# Patient Record
Sex: Male | Born: 1960 | Race: White | Hispanic: No | Marital: Single | Smoking: Former smoker
Health system: Southern US, Community
[De-identification: ages and names within clinical notes are randomized; demographics above are authoritative.]

## PROBLEM LIST (undated history)

## (undated) DIAGNOSIS — R519 Headache, unspecified: Secondary | ICD-10-CM

## (undated) DIAGNOSIS — N159 Renal tubulo-interstitial disease, unspecified: Secondary | ICD-10-CM

## (undated) DIAGNOSIS — F4310 Post-traumatic stress disorder, unspecified: Secondary | ICD-10-CM

## (undated) DIAGNOSIS — I10 Essential (primary) hypertension: Secondary | ICD-10-CM

## (undated) DIAGNOSIS — R0600 Dyspnea, unspecified: Secondary | ICD-10-CM

## (undated) DIAGNOSIS — F419 Anxiety disorder, unspecified: Secondary | ICD-10-CM

## (undated) DIAGNOSIS — Z87442 Personal history of urinary calculi: Secondary | ICD-10-CM

## (undated) DIAGNOSIS — C801 Malignant (primary) neoplasm, unspecified: Secondary | ICD-10-CM

## (undated) DIAGNOSIS — R7303 Prediabetes: Secondary | ICD-10-CM

## (undated) DIAGNOSIS — J189 Pneumonia, unspecified organism: Secondary | ICD-10-CM

## (undated) HISTORY — PX: HUMERUS FRACTURE SURGERY: SHX670

## (undated) HISTORY — PX: TONSILLECTOMY AND ADENOIDECTOMY: SUR1326

---

## 2003-08-17 ENCOUNTER — Emergency Department (HOSPITAL_COMMUNITY): Admission: EM | Admit: 2003-08-17 | Discharge: 2003-08-17 | Payer: Self-pay | Admitting: Family Medicine

## 2003-09-24 ENCOUNTER — Emergency Department (HOSPITAL_COMMUNITY): Admission: AD | Admit: 2003-09-24 | Discharge: 2003-09-24 | Payer: Self-pay | Admitting: Family Medicine

## 2003-12-31 ENCOUNTER — Emergency Department (HOSPITAL_COMMUNITY): Admission: EM | Admit: 2003-12-31 | Discharge: 2003-12-31 | Payer: Self-pay | Admitting: Family Medicine

## 2005-12-11 ENCOUNTER — Emergency Department (HOSPITAL_COMMUNITY): Admission: EM | Admit: 2005-12-11 | Discharge: 2005-12-11 | Payer: Self-pay | Admitting: Family Medicine

## 2013-07-10 HISTORY — PX: MULTIPLE TOOTH EXTRACTIONS: SHX2053

## 2015-01-08 ENCOUNTER — Encounter (HOSPITAL_COMMUNITY): Payer: Self-pay | Admitting: Emergency Medicine

## 2015-01-08 ENCOUNTER — Emergency Department (HOSPITAL_COMMUNITY): Payer: Self-pay

## 2015-01-08 ENCOUNTER — Emergency Department (HOSPITAL_COMMUNITY)
Admission: EM | Admit: 2015-01-08 | Discharge: 2015-01-08 | Disposition: A | Discharge status: Home / Self Care | Payer: Self-pay | Attending: Emergency Medicine | Admitting: Emergency Medicine

## 2015-01-08 DIAGNOSIS — K5900 Constipation, unspecified: Secondary | ICD-10-CM | POA: Insufficient documentation

## 2015-01-08 DIAGNOSIS — I10 Essential (primary) hypertension: Secondary | ICD-10-CM | POA: Insufficient documentation

## 2015-01-08 DIAGNOSIS — R1032 Left lower quadrant pain: Secondary | ICD-10-CM

## 2015-01-08 DIAGNOSIS — N508 Other specified disorders of male genital organs: Secondary | ICD-10-CM | POA: Insufficient documentation

## 2015-01-08 DIAGNOSIS — N200 Calculus of kidney: Secondary | ICD-10-CM | POA: Insufficient documentation

## 2015-01-08 DIAGNOSIS — Z79899 Other long term (current) drug therapy: Secondary | ICD-10-CM | POA: Insufficient documentation

## 2015-01-08 HISTORY — DX: Essential (primary) hypertension: I10

## 2015-01-08 LAB — COMPREHENSIVE METABOLIC PANEL
ALT: 56 U/L (ref 17–63)
ANION GAP: 10 (ref 5–15)
AST: 28 U/L (ref 15–41)
Albumin: 4.3 g/dL (ref 3.5–5.0)
Alkaline Phosphatase: 86 U/L (ref 38–126)
BILIRUBIN TOTAL: 1 mg/dL (ref 0.3–1.2)
BUN: 16 mg/dL (ref 6–20)
CO2: 26 mmol/L (ref 22–32)
CREATININE: 1.11 mg/dL (ref 0.61–1.24)
Calcium: 9.1 mg/dL (ref 8.9–10.3)
Chloride: 100 mmol/L — ABNORMAL LOW (ref 101–111)
GLUCOSE: 106 mg/dL — AB (ref 65–99)
POTASSIUM: 4.4 mmol/L (ref 3.5–5.1)
Sodium: 136 mmol/L (ref 135–145)
Total Protein: 7.7 g/dL (ref 6.5–8.1)

## 2015-01-08 LAB — URINALYSIS, ROUTINE W REFLEX MICROSCOPIC
BILIRUBIN URINE: NEGATIVE
GLUCOSE, UA: NEGATIVE mg/dL
LEUKOCYTES UA: NEGATIVE
NITRITE: NEGATIVE
Protein, ur: NEGATIVE mg/dL
Specific Gravity, Urine: 1.02 (ref 1.005–1.030)
UROBILINOGEN UA: 0.2 mg/dL (ref 0.0–1.0)
pH: 6 (ref 5.0–8.0)

## 2015-01-08 LAB — CBC WITH DIFFERENTIAL/PLATELET
BASOS PCT: 0 % (ref 0–1)
Basophils Absolute: 0 10*3/uL (ref 0.0–0.1)
Eosinophils Absolute: 0.1 10*3/uL (ref 0.0–0.7)
Eosinophils Relative: 1 % (ref 0–5)
HCT: 45.5 % (ref 39.0–52.0)
HEMOGLOBIN: 14.8 g/dL (ref 13.0–17.0)
LYMPHS ABS: 0.5 10*3/uL — AB (ref 0.7–4.0)
Lymphocytes Relative: 5 % — ABNORMAL LOW (ref 12–46)
MCH: 27.9 pg (ref 26.0–34.0)
MCHC: 32.5 g/dL (ref 30.0–36.0)
MCV: 85.8 fL (ref 78.0–100.0)
MONO ABS: 0.7 10*3/uL (ref 0.1–1.0)
MONOS PCT: 8 % (ref 3–12)
Neutro Abs: 7.3 10*3/uL (ref 1.7–7.7)
Neutrophils Relative %: 86 % — ABNORMAL HIGH (ref 43–77)
Platelets: 227 10*3/uL (ref 150–400)
RBC: 5.3 MIL/uL (ref 4.22–5.81)
RDW: 14.5 % (ref 11.5–15.5)
WBC: 8.5 10*3/uL (ref 4.0–10.5)

## 2015-01-08 LAB — URINE MICROSCOPIC-ADD ON

## 2015-01-08 MED ORDER — KETOROLAC TROMETHAMINE 30 MG/ML IJ SOLN
30.0000 mg | Freq: Once | INTRAMUSCULAR | Status: AC
  Administered 2015-01-08: 30 mg via INTRAVENOUS
  Filled 2015-01-08: qty 1

## 2015-01-08 MED ORDER — SODIUM CHLORIDE 0.9 % IV SOLN
Freq: Once | INTRAVENOUS | Status: AC
  Administered 2015-01-08: 100 mL/h via INTRAVENOUS

## 2015-01-08 MED ORDER — IOHEXOL 300 MG/ML  SOLN
25.0000 mL | Freq: Once | INTRAMUSCULAR | Status: AC | PRN
  Administered 2015-01-08: 25 mL via ORAL

## 2015-01-08 MED ORDER — IOHEXOL 300 MG/ML  SOLN
100.0000 mL | Freq: Once | INTRAMUSCULAR | Status: AC | PRN
  Administered 2015-01-08: 100 mL via INTRAVENOUS

## 2015-01-08 MED ORDER — ONDANSETRON HCL 4 MG/2ML IJ SOLN
4.0000 mg | Freq: Once | INTRAMUSCULAR | Status: AC
  Administered 2015-01-08: 4 mg via INTRAVENOUS
  Filled 2015-01-08: qty 2

## 2015-01-08 MED ORDER — DOCUSATE SODIUM 100 MG PO CAPS: 100.0000 mg | ORAL_CAPSULE | Freq: Two times a day (BID) | ORAL | Status: DC

## 2015-01-08 MED ORDER — TAMSULOSIN HCL 0.4 MG PO CAPS: 0.4000 mg | ORAL_CAPSULE | Freq: Every day | ORAL | Status: DC

## 2015-01-08 MED ORDER — ONDANSETRON 4 MG PO TBDP: 4.0000 mg | ORAL_TABLET | Freq: Three times a day (TID) | ORAL | Status: DC | PRN

## 2015-01-08 MED ORDER — MORPHINE SULFATE 4 MG/ML IJ SOLN
4.0000 mg | INTRAMUSCULAR | Status: DC | PRN
  Administered 2015-01-08: 4 mg via INTRAVENOUS
  Filled 2015-01-08 (×2): qty 1

## 2015-01-08 MED ORDER — MAGNESIUM CITRATE PO SOLN
0.5000 | Freq: Once | ORAL | Status: AC
  Administered 2015-01-08: 0.5 via ORAL
  Filled 2015-01-08: qty 296

## 2015-01-08 MED ORDER — TAMSULOSIN HCL 0.4 MG PO CAPS
0.4000 mg | ORAL_CAPSULE | Freq: Once | ORAL | Status: AC
  Administered 2015-01-08: 0.4 mg via ORAL
  Filled 2015-01-08: qty 1

## 2015-01-08 MED ORDER — HYDROMORPHONE HCL 1 MG/ML IJ SOLN
1.0000 mg | Freq: Once | INTRAMUSCULAR | Status: AC
  Administered 2015-01-08: 1 mg via INTRAVENOUS
  Filled 2015-01-08: qty 1

## 2015-01-08 MED ORDER — SODIUM CHLORIDE 0.9 % IV BOLUS (SEPSIS)
500.0000 mL | Freq: Once | INTRAVENOUS | Status: AC
  Administered 2015-01-08: 500 mL via INTRAVENOUS

## 2015-01-08 MED ORDER — OXYCODONE-ACETAMINOPHEN 5-325 MG PO TABS: 2.0000 | ORAL_TABLET | ORAL | Status: DC | PRN

## 2015-01-08 NOTE — ED Provider Notes (Signed)
CSN: [id number]     Arrival date & time 01/08/15  1502 History   First MD Initiated Contact with Patient 01/08/15 1545     Chief Complaint  Patient presents with  . Abdominal Pain      HPI  She presents for evaluation of abdominal pain he is concerned that he is constipated because he has not had a bowel movement for last 4 days. He took some magnesium citrate last night and took an enema this morning. He continues to have pain in his left testicle left abdomen and left flank. States he's had a kidney stone he states it does not feel like a kidney stone.  He denies fever shakes chills. Mild nausea no vomiting.  Past Medical History  Diagnosis Date  . Hypertension    History reviewed. No pertinent past surgical history. History reviewed. No pertinent family history. History  Substance Use Topics  . Smoking status: Never Smoker   . Smokeless tobacco: Not on file  . Alcohol Use: No    Review of Systems  Constitutional: Negative for fever, chills, diaphoresis, appetite change and fatigue.  HENT: Negative for mouth sores, sore throat and trouble swallowing.   Eyes: Negative for visual disturbance.  Respiratory: Negative for cough, chest tightness, shortness of breath and wheezing.   Cardiovascular: Negative for chest pain.  Gastrointestinal: Positive for nausea, abdominal pain and constipation. Negative for vomiting, diarrhea and abdominal distention.  Endocrine: Negative for polydipsia, polyphagia and polyuria.  Genitourinary: Positive for testicular pain. Negative for dysuria, frequency and hematuria.  Musculoskeletal: Negative for gait problem.  Skin: Negative for color change, pallor and rash.  Neurological: Negative for dizziness, syncope, light-headedness and headaches.  Hematological: Does not bruise/bleed easily.  Psychiatric/Behavioral: Negative for behavioral problems and confusion.      Allergies  Chocolate and Other  Home Medications   Prior to Admission  medications   Medication Sig Start Date End Date Taking? Authorizing Provider  amLODipine (NORVASC) 10 MG tablet Take 10 mg by mouth daily.   Yes Historical Provider, MD  Glycerin, Laxative, (FLEET LIQUID GLYCERIN SUPP RE) Place 1 suppository rectally once.   Yes Historical Provider, MD  ibuprofen (ADVIL,MOTRIN) 200 MG tablet Take 400 mg by mouth every 6 (six) hours as needed for headache, mild pain or moderate pain.   Yes Historical Provider, MD  lisinopril (PRINIVIL,ZESTRIL) 40 MG tablet Take 40 mg by mouth daily.   Yes Historical Provider, MD  magnesium citrate SOLN Take 1 Bottle by mouth once.   Yes Historical Provider, MD  docusate sodium (COLACE) 100 MG capsule Take 1 capsule (100 mg total) by mouth every 12 (twelve) hours. 01/08/15   Tanna Furry, MD  ondansetron (ZOFRAN ODT) 4 MG disintegrating tablet Take 1 tablet (4 mg total) by mouth every 8 (eight) hours as needed for nausea. 01/08/15   Tanna Furry, MD  oxyCODONE-acetaminophen (PERCOCET/ROXICET) 5-325 MG per tablet Take 2 tablets by mouth every 4 (four) hours as needed. 01/08/15   Tanna Furry, MD  tamsulosin (FLOMAX) 0.4 MG CAPS capsule Take 1 capsule (0.4 mg total) by mouth daily. 01/08/15   Tanna Furry, MD   BP 159/80 mmHg  Pulse 84  Temp(Src) 98.4 F (36.9 C) (Oral)  Resp 20  SpO2 99% Physical Exam  Constitutional: He is oriented to person, place, and time. He appears well-developed and well-nourished. No distress.  HENT:  Head: Normocephalic.  Eyes: Conjunctivae are normal. Pupils are equal, round, and reactive to light. No scleral icterus.  Neck: Normal range  of motion. Neck supple. No thyromegaly present.  Cardiovascular: Normal rate and regular rhythm.  Exam reveals no gallop and no friction rub.   No murmur heard. Pulmonary/Chest: Effort normal and breath sounds normal. No respiratory distress. He has no wheezes. He has no rales.  Abdominal: Soft. Bowel sounds are normal. He exhibits no distension. There is no tenderness. There is  no rebound.    Musculoskeletal: Normal range of motion.  Neurological: He is alert and oriented to person, place, and time.  Skin: Skin is warm and dry. No rash noted.  Psychiatric: He has a normal mood and affect. His behavior is normal.    ED Course  Procedures (including critical care time) Labs Review Labs Reviewed  CBC WITH DIFFERENTIAL/PLATELET - Abnormal; Notable for the following:    Neutrophils Relative % 86 (*)    Lymphocytes Relative 5 (*)    Lymphs Abs 0.5 (*)    All other components within normal limits  COMPREHENSIVE METABOLIC PANEL - Abnormal; Notable for the following:    Chloride 100 (*)    Glucose, Bld 106 (*)    All other components within normal limits  URINALYSIS, ROUTINE W REFLEX MICROSCOPIC (NOT AT [facility]) - Abnormal; Notable for the following:    Hgb urine dipstick LARGE (*)    Ketones, ur >80 (*)    All other components within normal limits  URINE MICROSCOPIC-ADD ON - Abnormal; Notable for the following:    Squamous Epithelial / LPF FEW (*)    All other components within normal limits    Imaging Review Ct Abdomen Pelvis W Contrast  01/08/2015   CLINICAL DATA:  Diffuse abdominal pain. Constipation. LEFT-greater-than- RIGHT sided abdominal pain. Initial encounter. LEFT lower quadrant pain. Abnormal urinalysis with hematuria. No elevated white blood cell count.  EXAM: CT ABDOMEN AND PELVIS WITH CONTRAST  TECHNIQUE: Multidetector CT imaging of the abdomen and pelvis was performed using the standard protocol following bolus administration of intravenous contrast.  CONTRAST:  92mL OMNIPAQUE IOHEXOL 300 MG/ML SOLN, 153mL OMNIPAQUE IOHEXOL 300 MG/ML SOLN  COMPARISON:  None.  FINDINGS: Musculoskeletal: Mild thoracolumbar degenerative disease. No aggressive osseous lesions.  Lung Bases: Dependent atelectasis.  Portions of the it mediastinum are visible on the most inferior slices. There is lymphadenopathy, with some calcified lymph nodes. These are incompletely visible  however the lymph nodes are enlarged.  Liver:  Normal.  Spleen:  Normal.  Gallbladder:  Normal.  Common bile duct:  Normal.  Pancreas:  Normal.  Adrenal glands:  Normal.  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 Delayed nephrogram in the LEFT inferior renal pole. The LEFT inferior pole moiety demonstrates mild hydroureteronephrosis extending to the iliac crossover where a 5 mm calculus is lodged. The upper pole moiety ureter and collecting system are normal. No LEFT upper pole collecting system calculi are present.  Nonobstructing LEFT inferior pole renal collecting system calculi are present with the largest measuring 6 mm.  Nonobstructing RIGHT renal collecting system calculi are present. The RIGHT ureter is normal. Too small to characterize low-density LEFT upper pole renal lesions, probably representing cysts. Similar lesions are present on the RIGHT.  Stomach:  Small hiatal hernia.  Small bowel:  Normal.  No obstruction or inflammatory change.  Colon:   Colonic diverticulosis.  Pelvic Genitourinary: Urinary bladder and prostate gland appear within normal limits.  Peritoneum: No free fluid.  No free air.  Vascular/lymphatic: No abdominal adenopathy, calcified or otherwise. No acute vascular abnormality.  Body  Wall: Fat containing RIGHT inguinal hernia.  IMPRESSION: 1. Duplicated LEFT renal collecting system. 5 mm obstructing calculus in the LEFT lower pole moiety ureter. 2. Partially visualized lymphadenopathy in the chest, with some areas of calcification. Noncontrast chest CT recommended in follow-up (nonemergent) for further evaluation. This finding is most commonly associated with sarcoidosis, old granulomatous disease and treated lymphoma. 3. Residual nonobstructing bilateral renal collecting system calculi. Small low-density bilateral renal lesions, most compatible with cysts. 4. Small hiatal hernia.   Electronically Signed   By: Dereck Ligas M.D.    On: 01/08/2015 18:14     EKG Interpretation None      MDM   Final diagnoses:  LLQ pain  Kidney stone    I discussed the results of CT scan with the patient at length. He does not show significant stool or stool burden on CT. He is a duplication of his left collecting system. He has hydronephrosis of the inferior collecting system. His kidney function is intact. He has a hematuria as expected. No leukocytosis. No signs of diverticulitis. I discussed this with him at length. He continues to insist that "I'm constipated". Offered him mag citrate tonight and Colace at home. Discussed with him several times that he will require pain control and hydration. Of offer him urological follow-up first of the week if he has not passed a stone with conservative measures.    Tanna Furry, MD 01/08/15 [phone]

## 2015-01-08 NOTE — ED Notes (Addendum)
Pt reports he has not had a bowel movement for past 4 days. Tried fleet enema and mag citrate last night, and mag citrate again this am with no relief. Went to PCP, who did not see any sign of impaction. Sent here for further eval. Having L back and groin pain. No emesis

## 2015-01-08 NOTE — Discharge Instructions (Signed)
You have a 59mm kidney stone. It has traveled more than 2/3 the distance to the bladder. Your CT scan does NOT show constipation or large amount of stool. You'll require pain medicine for the next several days until your kidney stone passes. We are giving you a laxative today, and stool softener to take it home so that you will continue to have bowel movements while taking the pain medication. Call the urologist at [facility] urology Tuesday if you continue to have pain.  Kidney Stones Kidney stones (urolithiasis) are solid masses that form inside your kidneys. The intense pain is caused by the stone moving through the kidney, ureter, bladder, and urethra (urinary tract). When the stone moves, the ureter starts to spasm around the stone. The stone is usually passed in your pee (urine).  HOME CARE  Drink enough fluids to keep your pee clear or pale yellow. This helps to get the stone out.  Strain all pee through the provided strainer. Do not pee without peeing through the strainer, not even once. If you pee the stone out, catch it in the strainer. The stone may be as small as a grain of salt. Take this to your doctor. This will help your doctor figure out what you can do to try to prevent more kidney stones.  Only take medicine as told by your doctor.  Follow up with your doctor as told.  Get follow-up X-rays as told by your doctor. GET HELP IF: You have pain that gets worse even if you have been taking pain medicine. GET HELP RIGHT AWAY IF:   Your pain does not get better with medicine.  You have a fever or shaking chills.  Your pain increases and gets worse over 18 hours.  You have new belly (abdominal) pain.  You feel faint or pass out.  You are unable to pee. MAKE SURE YOU:   Understand these instructions.  Will watch your condition.  Will get help right away if you are not doing well or get worse. Document Released: 12/13/2007 Document Revised: 02/26/2013 Document Reviewed:  11/27/2012 [facility] Patient Information 2015 [place], [place]. This information is not intended to replace advice given to you by your health care provider. Make sure you discuss any questions you have with your health care provider.

## 2016-07-10 HISTORY — PX: COLONOSCOPY: SHX174

## 2016-12-27 ENCOUNTER — Other Ambulatory Visit: Payer: Self-pay | Admitting: Orthopedic Surgery

## 2016-12-27 DIAGNOSIS — M5442 Lumbago with sciatica, left side: Secondary | ICD-10-CM

## 2016-12-30 ENCOUNTER — Ambulatory Visit
Admission: RE | Admit: 2016-12-30 | Discharge: 2016-12-30 | Disposition: A | Discharge status: Home / Self Care | Payer: Worker's Compensation | Source: Ambulatory Visit | Attending: Orthopedic Surgery | Admitting: Orthopedic Surgery

## 2016-12-30 DIAGNOSIS — M5442 Lumbago with sciatica, left side: Secondary | ICD-10-CM | POA: Diagnosis present

## 2016-12-30 DIAGNOSIS — M5136 Other intervertebral disc degeneration, lumbar region: Secondary | ICD-10-CM | POA: Diagnosis not present

## 2017-01-08 ENCOUNTER — Other Ambulatory Visit: Payer: Self-pay | Admitting: Orthopedic Surgery

## 2017-01-08 DIAGNOSIS — M5442 Lumbago with sciatica, left side: Secondary | ICD-10-CM

## 2017-01-18 ENCOUNTER — Other Ambulatory Visit: Payer: Self-pay | Admitting: Orthopedic Surgery

## 2017-01-18 DIAGNOSIS — M5442 Lumbago with sciatica, left side: Secondary | ICD-10-CM

## 2017-01-18 DIAGNOSIS — M5136 Other intervertebral disc degeneration, lumbar region: Secondary | ICD-10-CM

## 2017-01-25 ENCOUNTER — Other Ambulatory Visit: Payer: Self-pay | Admitting: Orthopedic Surgery

## 2017-01-25 DIAGNOSIS — S42321D Displaced transverse fracture of shaft of humerus, right arm, subsequent encounter for fracture with routine healing: Secondary | ICD-10-CM

## 2017-01-25 DIAGNOSIS — M7521 Bicipital tendinitis, right shoulder: Secondary | ICD-10-CM

## 2017-01-26 ENCOUNTER — Ambulatory Visit
Admission: RE | Admit: 2017-01-26 | Discharge: 2017-01-26 | Disposition: A | Discharge status: Home / Self Care | Payer: Worker's Compensation | Source: Ambulatory Visit | Attending: Orthopedic Surgery | Admitting: Orthopedic Surgery

## 2017-01-26 DIAGNOSIS — M5136 Other intervertebral disc degeneration, lumbar region: Secondary | ICD-10-CM

## 2017-01-26 DIAGNOSIS — M5442 Lumbago with sciatica, left side: Secondary | ICD-10-CM

## 2017-01-26 MED ORDER — METHYLPREDNISOLONE ACETATE 40 MG/ML INJ SUSP (RADIOLOG
120.0000 mg | Freq: Once | INTRAMUSCULAR | Status: AC
  Administered 2017-01-26: 120 mg via EPIDURAL

## 2017-01-26 MED ORDER — IOPAMIDOL (ISOVUE-M 200) INJECTION 41%
1.0000 mL | Freq: Once | INTRAMUSCULAR | Status: AC
  Administered 2017-01-26: 1 mL via EPIDURAL

## 2017-01-26 NOTE — Discharge Instructions (Signed)

## 2017-02-07 ENCOUNTER — Ambulatory Visit
Admission: RE | Admit: 2017-02-07 | Discharge: 2017-02-07 | Disposition: A | Discharge status: Home / Self Care | Payer: Worker's Compensation | Source: Ambulatory Visit | Attending: Orthopedic Surgery | Admitting: Orthopedic Surgery

## 2017-02-07 DIAGNOSIS — S42321D Displaced transverse fracture of shaft of humerus, right arm, subsequent encounter for fracture with routine healing: Secondary | ICD-10-CM

## 2017-02-07 DIAGNOSIS — M7521 Bicipital tendinitis, right shoulder: Secondary | ICD-10-CM

## 2019-07-26 ENCOUNTER — Ambulatory Visit (HOSPITAL_COMMUNITY)
Admission: EM | Admit: 2019-07-26 | Discharge: 2019-07-26 | Disposition: A | Discharge status: Home / Self Care | Payer: Self-pay | Attending: Urgent Care | Admitting: Urgent Care

## 2019-07-26 ENCOUNTER — Encounter (HOSPITAL_COMMUNITY): Payer: Self-pay

## 2019-07-26 ENCOUNTER — Other Ambulatory Visit: Payer: Self-pay

## 2019-07-26 DIAGNOSIS — R030 Elevated blood-pressure reading, without diagnosis of hypertension: Secondary | ICD-10-CM

## 2019-07-26 DIAGNOSIS — E669 Obesity, unspecified: Secondary | ICD-10-CM

## 2019-07-26 DIAGNOSIS — I10 Essential (primary) hypertension: Secondary | ICD-10-CM

## 2019-07-26 DIAGNOSIS — H5712 Ocular pain, left eye: Secondary | ICD-10-CM

## 2019-07-26 DIAGNOSIS — H00014 Hordeolum externum left upper eyelid: Secondary | ICD-10-CM

## 2019-07-26 MED ORDER — CEFDINIR 300 MG PO CAPS: 300.0000 mg | ORAL_CAPSULE | Freq: Two times a day (BID) | ORAL | 0 refills | Status: DC

## 2019-07-26 MED ORDER — ERYTHROMYCIN 5 MG/GM OP OINT: TOPICAL_OINTMENT | OPHTHALMIC | 0 refills | Status: DC

## 2019-07-26 NOTE — ED Provider Notes (Signed)
[facility]   MRN: [id number] DOB: Jul 14, 1960  Subjective:   Lorenza Rusek is a 59 y.o. male presenting for 2-day history of acute onset persistent and worsening left upper eyelid swelling with redness and pustule.  Patient states he has been using hot compresses without any relief.  Regarding his hypertension, states that he is normally right at [id number] systolic.  He states that usually his blood pressure becomes elevated when he has stress as he does now due to his eye.  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  No current facility-administered medications for this encounter.  Current Outpatient Medications:  .  amLODipine (NORVASC) 10 MG tablet, Take 10 mg by mouth daily., Disp: , Rfl:  .  lisinopril (PRINIVIL,ZESTRIL) 40 MG tablet, Take 40 mg by mouth daily., Disp: , Rfl:  .  docusate sodium (COLACE) 100 MG capsule, Take 1 capsule (100 mg total) by mouth every 12 (twelve) hours., Disp: 60 capsule, Rfl: 0 .  Glycerin, Laxative, (FLEET LIQUID GLYCERIN SUPP RE), Place 1 suppository rectally once., Disp: , Rfl:  .  ibuprofen (ADVIL,MOTRIN) 200 MG tablet, Take 400 mg by mouth every 6 (six) hours as needed for headache, mild pain or moderate pain., Disp: , Rfl:  .  magnesium citrate SOLN, Take 1 Bottle by mouth once., Disp: , Rfl:  .  ondansetron (ZOFRAN ODT) 4 MG disintegrating tablet, Take 1 tablet (4 mg total) by mouth every 8 (eight) hours as needed for nausea., Disp: 20 tablet, Rfl: 0 .  oxyCODONE-acetaminophen (PERCOCET/ROXICET) 5-325 MG per tablet, Take 2 tablets by mouth every 4 (four) hours as needed., Disp: 6 tablet, Rfl: 0 .  tamsulosin (FLOMAX) 0.4 MG CAPS capsule, Take 1 capsule (0.4 mg total) by mouth daily., Disp: 7 capsule, Rfl: 0   Allergies  Allergen Reactions  . Chocolate     Hyperactivity, respiratory distress  . Other     Novocaine- respiratory distress    Past Medical History:  Diagnosis Date  . Hypertension      History reviewed. No pertinent surgical history.  Family History  Problem Relation Age of Onset  . Hypertension Mother   . Diabetes Mother   . Bipolar disorder Mother   . Hypertension Father     Social History   Tobacco Use  . Smoking status: Former Smoker    Types: Cigarettes    Quit date: 1980    Years since quitting: 41.0  . Smokeless tobacco: Never Used  Substance Use Topics  . Alcohol use: No  . Drug use: No    ROS   Objective:   Vitals: BP (!) 173/103 (BP Location: Left Arm)   Pulse 78   Temp 98.7 F (37.1 C) (Oral)   Resp 18   SpO2 98%   Physical Exam Constitutional:      General: He is not in acute distress.    Appearance: Normal appearance. He is well-developed. He is obese. He is not ill-appearing, toxic-appearing or diaphoretic.  HENT:     Head: Normocephalic and atraumatic.     Right Ear: External ear normal.     Left Ear: External ear normal.     Nose: Nose normal.     Mouth/Throat:     Mouth: Mucous membranes are moist.     Pharynx: Oropharynx is clear.  Eyes:     General: No scleral icterus.       Right eye: No foreign body, discharge or hordeolum.  Left eye: Discharge and hordeolum present.No foreign body.     Extraocular Movements: Extraocular movements intact.     Conjunctiva/sclera:     Right eye: Right conjunctiva is not injected. No chemosis, exudate or hemorrhage.    Left eye: Left conjunctiva is injected (mildly). No chemosis, exudate or hemorrhage.    Pupils: Pupils are equal, round, and reactive to light.   Cardiovascular:     Rate and Rhythm: Normal rate and regular rhythm.     Heart sounds: Normal heart sounds. No murmur. No friction rub. No gallop.   Pulmonary:     Effort: Pulmonary effort is normal. No respiratory distress.     Breath sounds: Normal breath sounds. No stridor. No wheezing, rhonchi or rales.  Neurological:     Mental Status: He is alert and oriented to person, place, and time.     Cranial Nerves:  No cranial nerve deficit.     Motor: No weakness.     Coordination: Coordination normal.     Gait: Gait normal.     Deep Tendon Reflexes: Reflexes normal.  Psychiatric:        Mood and Affect: Mood normal.        Behavior: Behavior normal.        Thought Content: Thought content normal.        Judgment: Judgment normal.      Assessment and Plan :   1. Hordeolum externum of left upper eyelid   2. Left eye pain   3. Essential hypertension   4. Elevated blood pressure reading   5. Obesity without serious comorbidity, unspecified classification, unspecified obesity type     Start patient on cefdinir, erythromycin ointment for management of significantly infected left upper eyelid stye.  Counseled patient on his blood pressure and he insists that it is elevated only due to his stress.  Reports that he will monitor it at home, discussed warning signs and symptoms of ACS and stroke. Counseled patient on potential for adverse effects with medications prescribed/recommended today, ER and return-to-clinic precautions discussed, patient verbalized understanding.    Jaynee Eagles, PA-C 07/26/19 1038

## 2019-07-26 NOTE — ED Triage Notes (Signed)
Patient presents to Urgent Care with complaints of left eye swelling and irritaiton since 2-3 days ago. Patient reports he has been trying warm and cold compresses, thinks there is a pustule under the area of swelling, pt denies vision changes.

## 2020-07-10 DIAGNOSIS — U071 COVID-19: Secondary | ICD-10-CM

## 2020-07-10 HISTORY — PX: CYST EXCISION: SHX5701

## 2020-07-10 HISTORY — DX: COVID-19: U07.1

## 2021-10-11 ENCOUNTER — Ambulatory Visit (INDEPENDENT_AMBULATORY_CARE_PROVIDER_SITE_OTHER): Payer: Worker's Compensation | Admitting: Orthopaedic Surgery

## 2021-10-11 ENCOUNTER — Encounter: Payer: Self-pay | Admitting: Orthopaedic Surgery

## 2021-10-11 ENCOUNTER — Ambulatory Visit: Payer: Self-pay

## 2021-10-11 VITALS — BP 141/86 | HR 62 | Ht 72.0 in | Wt 365.0 lb

## 2021-10-11 DIAGNOSIS — M5459 Other low back pain: Secondary | ICD-10-CM

## 2021-10-11 DIAGNOSIS — M545 Low back pain, unspecified: Secondary | ICD-10-CM

## 2021-10-11 NOTE — Progress Notes (Signed)
? ?Office Visit Note ?  ?Patient: Keith Moreno           ?Date of Birth: 1961/02/27           ?MRN: [id number] ?Visit Date: 10/11/2021 ?             ?Requested by: No referring provider defined for this encounter. ?PCP: Pcp, No ? ? ?Assessment & Plan: ?Visit Diagnoses:  ?1. Acute bilateral low back pain, unspecified whether sciatica present   ? ? 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  He still in physical therapy plan to recheck him in 3 weeks if he has not made improvement we will consider FCE and then activities based on FCE findings.  I reviewed with him CT scan which she brought a disc so we could review this this is negative for acute changes.  He does have some minimal disc protrusions similar to the MRI scan that he had in 2018 with his other injury which is listed below.  That showed some mild degenerative changes.  Patient is to continue therapy until I see him back in 3 weeks. ? ?Follow-Up Instructions: No follow-ups on file.  ? ?Orders:  ?Orders Placed This Encounter  ?Procedures  ? XR Lumbar Spine 2-3 Views  ? ?No orders of the defined types were placed in this encounter. ? ? ? ? Procedures: ?No procedures performed ? ? ?Clinical Data: ?No additional findings. ? ? ?Subjective: ?Chief Complaint  ?Patient presents with  ? Lower Back - Pain  ?  OTJI 07/22/2021  ? ? ?HPI 61 year old male referred by [employer] 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  Pain radiates at the lumbosacral junction sometimes up to the mid lumbar region primarily on the left side and some into the left thigh most times stopping at the knee occasionally lost a little sensation in his ankle he states he has had 2 episodes when he fell since his injury when he felt like left leg was not holding him well.  He has used some ibuprofen and Tylenol.   Chart review showed that back in 2018 he had on-the-job injury with a fall and some back and left leg pain had an MRI scan showed some mild protrusion and he had a single epidural injection and this was at the time when he had a humerus fracture that eventually had surgery at [facility] was plated and is healed.  Recently had a CT scan available and reviewed on disc.  This is negative for acute bony injury.  Patient's been out of work and has been followed at [facility] they recently gave him a work note that stated no driving no lifting pushing or pulling more than 5 pounds no climbing in and out of a truck and this was dated 09/07/2021.  Patient's been going to therapy clinic here in [place] [address]  He states he has had about 4 visits so far with therapy.  Patient states he has been going to physical therapy at [place]. ? ?Review of Systems type 2 diabetes on oral medication not on insulin. ? ? ? ?Objective: ?Vital Signs: BP (!) 141/86   Pulse 62   Ht 6' (1.829 m)   Wt (!) 365 lb (165.6 kg)   BMI 49.50 kg/m?  ? ?  Physical Exam ?Constitutional:   ?   Appearance: He is well-developed.  ?HENT:  ?   Head: Normocephalic and atraumatic.  ?   Right Ear: External ear normal.  ?   Left Ear: External ear normal.  ?Eyes:  ?   Pupils: Pupils are equal, round, and reactive to light.  ?Neck:  ?   Thyroid: No thyromegaly.  ?   Trachea: No tracheal deviation.  ?Cardiovascular:  ?   Rate and Rhythm: Normal rate.  ?Pulmonary:  ?   Effort: Pulmonary effort is normal.  ?   Breath sounds: No wheezing.  ?Abdominal:  ?   General: Bowel sounds are normal.  ?   Palpations: Abdomen is soft.  ?Musculoskeletal:  ?   Cervical back: Neck supple.  ?Skin: ?   General: Skin is warm and dry.  ?   Capillary Refill: Capillary refill takes less than 2 seconds.  ?Neurological:  ?   Mental Status: He is alert and oriented to person, place, and time.  ?Psychiatric:     ?   Behavior: Behavior normal.     ?   Thought Content: Thought content normal.      ?   Judgment: Judgment normal.  ? ? ?Ortho Exam patient has negative logroll the hips has 1+ knee and ankle jerks symmetrical.  Knee reaches full extension some mild crepitus with knee extension.  Anterior tib gastrocsoleus is strong.  Patient is able to lift his right arm up overhead he has some weakness supraspinatus full extension of the elbow.  Well-healed scars from previous anterior plating. ? ?Specialty Comments:  ?No specialty comments available. ? ?Imaging: ?CLINICAL DATA:  Left-sided low back pain. Left-sided sciatica. Left ?leg pain. ?  ?EXAM: ?MRI LUMBAR SPINE WITHOUT CONTRAST ?  ?TECHNIQUE: ?Multiplanar, multisequence MR imaging of the lumbar spine was ?performed. No intravenous contrast was administered. ?  ?COMPARISON:  None. ?  ?FINDINGS: ?Segmentation:  Standard. ?  ?Alignment:  Physiologic. ?  ?Vertebrae:  No fracture, evidence of discitis, or bone lesion. ?  ?Conus medullaris: Extends to the L1 level and appears normal. ?  ?Paraspinal and other soft tissues: Negative. ?  ?Disc levels: ?  ?L1-2:  Normal. ?  ?L2-3:  Normal. ?  ?L3-4:  Minimal disc desiccation.  No disc bulging or protrusion. ?  ?L4-5: Minimal disc desiccation. Tiny central disc bulge seen only on ?the axial view, image 24 of series 6. No neural impingement. ?  ?L5-S1: Small central disc bulge with no neural impingement. Minimal ?degenerative changes of the facet joints. ?  ?IMPRESSION: ?Minimal degenerative disc disease at L4-5 and L5-S1 without neural ?impingement. Minimal degenerative changes of the facet joints at ?L5-S1. ?  ?  ?Electronically Signed ?  By: Lorriane Shire M.D. ?  On: 12/30/2016 14:55 ? ? ?PMFS History: ?There are no problems to display for this patient. ? ?Past Medical History:  ?Diagnosis Date  ? Hypertension   ?  ?Family History  ?Problem Relation Age of Onset  ? Hypertension Mother   ? Diabetes Mother   ? Bipolar disorder Mother   ? Hypertension Father   ?  ?No past surgical history on file. ?Social History   ? ?Occupational History  ? Not on file  ?Tobacco Use  ? Smoking status: Former  ?  Types: Cigarettes  ?  Quit date: 13  ?  Years since quitting: 43.2  ? Smokeless tobacco: Never  ?Vaping Use  ? Vaping Use: Never used  ?Substance and Sexual Activity  ?  Alcohol use: No  ? Drug use: No  ? Sexual activity: Not on file  ? ? ? ? ? ? ?

## 2021-11-01 ENCOUNTER — Encounter: Payer: Self-pay | Admitting: Orthopaedic Surgery

## 2021-11-01 ENCOUNTER — Telehealth: Payer: Self-pay

## 2021-11-01 ENCOUNTER — Ambulatory Visit (INDEPENDENT_AMBULATORY_CARE_PROVIDER_SITE_OTHER): Payer: Worker's Compensation | Admitting: Orthopaedic Surgery

## 2021-11-01 VITALS — BP 152/93 | HR 63 | Ht 72.0 in | Wt 365.0 lb

## 2021-11-01 DIAGNOSIS — M545 Low back pain, unspecified: Secondary | ICD-10-CM

## 2021-11-01 NOTE — Telephone Encounter (Signed)
Thank you :)

## 2021-11-01 NOTE — Telephone Encounter (Signed)
FYI ?An FCE was ordered on this pt ?

## 2021-11-01 NOTE — Progress Notes (Signed)
? ?Office Visit Note ?  ?Patient: Keith Moreno           ?Date of Birth: 13-Jun-1961           ?MRN: [id number] ?Visit Date: 11/01/2021 ?             ?Requested by: No referring provider defined for this encounter. ?PCP: Pcp, No ? ? ?Assessment & Plan: ?Visit Diagnoses:  ?1. Low back pain without sciatica, unspecified back pain laterality, unspecified chronicity   ? ? ?Plan: 3 months post on-the-job injury with persistent problems with his left leg.  He states he still would have trouble climbing in and out of a truck but could do it with his right leg but not with his left.  Recommend proceeding with FCE for evaluation.  He remains out of work and continues in physical therapy pending office follow-up for review of FCE findings. ? ?Follow-Up Instructions: No follow-ups on file.  ? ?Orders:  ?No orders of the defined types were placed in this encounter. ? ?No orders of the defined types were placed in this encounter. ? ? ? ? Procedures: ?No procedures performed ? ? ?Clinical Data: ?No additional findings. ? ? ?Subjective: ?Chief Complaint  ?Patient presents with  ? Lower Back - Pain, Follow-up  ? ? ?HPI 61 year old male returns for follow-up Worker's Comp. injury on 07/22/2021.  Went to crank the landing gear tight heard a pop and crack had pain in his back and pain in his left leg.  He has been going to physical therapy is now 3 months out from his injury and states therapy is helping some but he still has trouble with his left leg getting in and out of a truck.  Past history of MRI scan lumbar 2018 which showed some minimal degenerative changes L4-5 and L5-S1.  Patient denies problems with his right lower extremity.  Had an old history of injury to his shoulder from an injury on the job 2018 that took him many months to get over. ? ?Review of Systems positive for type 2 diabetes on oral medication not on insulin. ? ? ?Objective: ?Vital Signs: BP (!) 152/93   Pulse 63   Ht 6' (1.829 m)   Wt (!) 365 lb (165.6  kg)   BMI 49.50 kg/m?  ? ?Physical Exam ?Constitutional:   ?   Appearance: He is well-developed.  ?HENT:  ?   Head: Normocephalic and atraumatic.  ?   Right Ear: External ear normal.  ?   Left Ear: External ear normal.  ?Eyes:  ?   Pupils: Pupils are equal, round, and reactive to light.  ?Neck:  ?   Thyroid: No thyromegaly.  ?   Trachea: No tracheal deviation.  ?Cardiovascular:  ?   Rate and Rhythm: Normal rate.  ?Pulmonary:  ?   Effort: Pulmonary effort is normal.  ?   Breath sounds: No wheezing.  ?Abdominal:  ?   General: Bowel sounds are normal.  ?   Palpations: Abdomen is soft.  ?Musculoskeletal:  ?   Cervical back: Neck supple.  ?Skin: ?   General: Skin is warm and dry.  ?   Capillary Refill: Capillary refill takes less than 2 seconds.  ?Neurological:  ?   Mental Status: He is alert and oriented to person, place, and time.  ?Psychiatric:     ?   Behavior: Behavior normal.     ?   Thought Content: Thought content normal.     ?   Judgment:  Judgment normal.  ? ? ?Ortho Exam patient gets from sitting standing using his arms ambulatory heel-toe gait without limp.  No rash or exposed skin. ? ?Specialty Comments:  ?No specialty comments available. ? ?Imaging: ?No results found. ? ? ?PMFS History: ?Patient Active Problem List  ? Diagnosis Date Noted  ? Low back pain 11/01/2021  ? ?Past Medical History:  ?Diagnosis Date  ? Hypertension   ?  ?Family History  ?Problem Relation Age of Onset  ? Hypertension Mother   ? Diabetes Mother   ? Bipolar disorder Mother   ? Hypertension Father   ?  ?History reviewed. No pertinent surgical history. ?Social History  ? ?Occupational History  ? Not on file  ?Tobacco Use  ? Smoking status: Former  ?  Types: Cigarettes  ?  Quit date: 47  ?  Years since quitting: 43.3  ? Smokeless tobacco: Never  ?Vaping Use  ? Vaping Use: Never used  ?Substance and Sexual Activity  ? Alcohol use: No  ? Drug use: No  ? Sexual activity: Not on file  ? ? ? ? ? ? ?

## 2021-11-01 NOTE — Addendum Note (Signed)
Addended by: Robyne Peers on: 11/01/2021 10:07 AM ? ? Modules accepted: Orders ? ?

## 2021-11-13 ENCOUNTER — Ambulatory Visit (HOSPITAL_COMMUNITY)
Admission: EM | Admit: 2021-11-13 | Discharge: 2021-11-14 | Disposition: A | Discharge status: Home / Self Care | Payer: No Typology Code available for payment source | Attending: Emergency Medicine | Admitting: Emergency Medicine

## 2021-11-13 ENCOUNTER — Encounter (HOSPITAL_COMMUNITY): Payer: Self-pay

## 2021-11-13 ENCOUNTER — Encounter (HOSPITAL_COMMUNITY)
Admission: EM | Disposition: A | Discharge status: Home / Self Care | Payer: Self-pay | Source: Home / Self Care | Attending: Emergency Medicine

## 2021-11-13 ENCOUNTER — Emergency Department (HOSPITAL_COMMUNITY): Payer: No Typology Code available for payment source | Admitting: Registered Nurse

## 2021-11-13 ENCOUNTER — Other Ambulatory Visit: Payer: Self-pay

## 2021-11-13 ENCOUNTER — Emergency Department (HOSPITAL_BASED_OUTPATIENT_CLINIC_OR_DEPARTMENT_OTHER): Payer: No Typology Code available for payment source | Admitting: Registered Nurse

## 2021-11-13 DIAGNOSIS — W44F3XA Food entering into or through a natural orifice, initial encounter: Secondary | ICD-10-CM

## 2021-11-13 DIAGNOSIS — T182XXA Foreign body in stomach, initial encounter: Secondary | ICD-10-CM | POA: Diagnosis not present

## 2021-11-13 DIAGNOSIS — X58XXXA Exposure to other specified factors, initial encounter: Secondary | ICD-10-CM | POA: Diagnosis not present

## 2021-11-13 DIAGNOSIS — T18128A Food in esophagus causing other injury, initial encounter: Secondary | ICD-10-CM

## 2021-11-13 DIAGNOSIS — D490 Neoplasm of unspecified behavior of digestive system: Secondary | ICD-10-CM

## 2021-11-13 DIAGNOSIS — K222 Esophageal obstruction: Secondary | ICD-10-CM | POA: Diagnosis not present

## 2021-11-13 DIAGNOSIS — N179 Acute kidney failure, unspecified: Secondary | ICD-10-CM | POA: Insufficient documentation

## 2021-11-13 DIAGNOSIS — I10 Essential (primary) hypertension: Secondary | ICD-10-CM | POA: Insufficient documentation

## 2021-11-13 DIAGNOSIS — C155 Malignant neoplasm of lower third of esophagus: Secondary | ICD-10-CM | POA: Diagnosis not present

## 2021-11-13 DIAGNOSIS — Z87891 Personal history of nicotine dependence: Secondary | ICD-10-CM | POA: Insufficient documentation

## 2021-11-13 DIAGNOSIS — K2289 Other specified disease of esophagus: Secondary | ICD-10-CM

## 2021-11-13 HISTORY — PX: ESOPHAGOGASTRODUODENOSCOPY: SHX5428

## 2021-11-13 HISTORY — PX: BIOPSY: SHX5522

## 2021-11-13 HISTORY — PX: FOREIGN BODY REMOVAL: SHX962

## 2021-11-13 LAB — I-STAT CHEM 8, ED
BUN: 27 mg/dL — ABNORMAL HIGH (ref 8–23)
Calcium, Ion: 1.49 mmol/L — ABNORMAL HIGH (ref 1.15–1.40)
Chloride: 103 mmol/L (ref 98–111)
Creatinine, Ser: 2 mg/dL — ABNORMAL HIGH (ref 0.61–1.24)
Glucose, Bld: 103 mg/dL — ABNORMAL HIGH (ref 70–99)
HCT: 37 % — ABNORMAL LOW (ref 39.0–52.0)
Hemoglobin: 12.6 g/dL — ABNORMAL LOW (ref 13.0–17.0)
Potassium: 3.8 mmol/L (ref 3.5–5.1)
Sodium: 139 mmol/L (ref 135–145)
TCO2: 28 mmol/L (ref 22–32)

## 2021-11-13 LAB — CBC
HCT: 37.2 % — ABNORMAL LOW (ref 39.0–52.0)
Hemoglobin: 12.5 g/dL — ABNORMAL LOW (ref 13.0–17.0)
MCH: 29.6 pg (ref 26.0–34.0)
MCHC: 33.6 g/dL (ref 30.0–36.0)
MCV: 88.2 fL (ref 80.0–100.0)
Platelets: 198 10*3/uL (ref 150–400)
RBC: 4.22 MIL/uL (ref 4.22–5.81)
RDW: 13.6 % (ref 11.5–15.5)
WBC: 6 10*3/uL (ref 4.0–10.5)
nRBC: 0 % (ref 0.0–0.2)

## 2021-11-13 LAB — GLUCOSE, CAPILLARY: Glucose-Capillary: 89 mg/dL (ref 70–99)

## 2021-11-13 SURGERY — EGD (ESOPHAGOGASTRODUODENOSCOPY)
Anesthesia: General

## 2021-11-13 MED ORDER — ROCURONIUM BROMIDE 10 MG/ML (PF) SYRINGE
PREFILLED_SYRINGE | INTRAVENOUS | Status: DC | PRN
  Administered 2021-11-13: 20 mg via INTRAVENOUS
  Administered 2021-11-13: 30 mg via INTRAVENOUS

## 2021-11-13 MED ORDER — ONDANSETRON HCL 4 MG/2ML IJ SOLN
INTRAMUSCULAR | Status: DC | PRN
  Administered 2021-11-13: 4 mg via INTRAVENOUS

## 2021-11-13 MED ORDER — SODIUM CHLORIDE 0.9 % IV BOLUS: 500.0000 mL | Freq: Once | INTRAVENOUS | Status: DC

## 2021-11-13 MED ORDER — SUCCINYLCHOLINE CHLORIDE 200 MG/10ML IV SOSY
PREFILLED_SYRINGE | INTRAVENOUS | Status: DC | PRN
  Administered 2021-11-13: 140 mg via INTRAVENOUS

## 2021-11-13 MED ORDER — GLUCAGON HCL RDNA (DIAGNOSTIC) 1 MG IJ SOLR: 1.0000 mg | Freq: Once | INTRAMUSCULAR | Status: DC

## 2021-11-13 MED ORDER — FENTANYL CITRATE (PF) 250 MCG/5ML IJ SOLN
INTRAMUSCULAR | Status: DC | PRN
Start: 2021-11-13 — End: 2021-11-14
  Administered 2021-11-13: 50 ug via INTRAVENOUS

## 2021-11-13 MED ORDER — PROPOFOL 10 MG/ML IV BOLUS
INTRAVENOUS | Status: AC
  Filled 2021-11-13: qty 20

## 2021-11-13 MED ORDER — SODIUM CHLORIDE 0.9 % IV SOLN: INTRAVENOUS | Status: DC

## 2021-11-13 MED ORDER — SUGAMMADEX SODIUM 200 MG/2ML IV SOLN
INTRAVENOUS | Status: DC | PRN
  Administered 2021-11-13 (×3): 100 mg via INTRAVENOUS

## 2021-11-13 MED ORDER — PROPOFOL 10 MG/ML IV BOLUS
INTRAVENOUS | Status: DC | PRN
  Administered 2021-11-13: 150 mg via INTRAVENOUS

## 2021-11-13 MED ORDER — PHENYLEPHRINE 80 MCG/ML (10ML) SYRINGE FOR IV PUSH (FOR BLOOD PRESSURE SUPPORT)
PREFILLED_SYRINGE | INTRAVENOUS | Status: DC | PRN
  Administered 2021-11-13 (×5): 80 ug via INTRAVENOUS

## 2021-11-13 MED ORDER — LIDOCAINE 2% (20 MG/ML) 5 ML SYRINGE
INTRAMUSCULAR | Status: DC | PRN
  Administered 2021-11-13: 40 mg via INTRAVENOUS

## 2021-11-13 NOTE — Op Note (Signed)
[facility] ?Patient Name: Keith Moreno ?Procedure Date : 11/13/2021 ?MRN: [id number] ?Attending MD: Jerene Bears , MD ?Date of Birth: 01/02/1961 ?CSN: [id number] ?Age: 61 ?Admit Type: Outpatient ?Procedure:                Upper GI endoscopy ?Indications:              Foreign body in the esophagus/food impaction ?Providers:                Lajuan Lines. Hilarie Fredrickson, MD, Grace Isaac, RN, Tyna Jaksch  ?                          Technician ?Referring MD:             Dorie Rank (Emerg Room) ?Medicines:                General Anesthesia ?Complications:            No immediate complications. ?Estimated Blood Loss:     Estimated blood loss was minimal. ?Procedure:                Pre-Anesthesia Assessment: ?                          - Prior to the procedure, a History and Physical  ?                          was performed, and patient medications and  ?                          allergies were reviewed. The patient's tolerance of  ?                          previous anesthesia was also reviewed. The risks  ?                          and benefits of the procedure and the sedation  ?                          options and risks were discussed with the patient.  ?                          All questions were answered, and informed consent  ?                          was obtained. Prior Anticoagulants: The patient has  ?                          taken no previous anticoagulant or antiplatelet  ?                          agents. ASA Grade Assessment: III - A patient with  ?                          severe systemic disease. After reviewing the risks  ?  and benefits, the patient was deemed in  ?                          satisfactory condition to undergo the procedure. ?                          After obtaining informed consent, the endoscope was  ?                          passed under direct vision. Throughout the  ?                          procedure, the patient's blood pressure, pulse, and  ?                           oxygen saturations were monitored continuously. The  ?                          [id number]-[id number] ([id number]) Olympus endoscope was introduced  ?                          through the mouth, and advanced to the second part  ?                          of duodenum. The upper GI endoscopy was technically  ?                          difficult and complex due to presence of foreign  ?                          body. The patient tolerated the procedure well. ?Scope In: ?Scope Out: ?Findings: ?     A large meat bolus impaction was found in the middle third of the  ?     esophagus and in the lower third of the esophagus. Care taken to  ?     maneuver around the food bolus which revealed a distal esophageal mass.  ?     Due to the size of the meal bolus it was not able to be advanced into  ?     the stomach. Removal of food was accomplished using combination of Roth  ?     net, rat-toothed forceps, and talon grasper. At the end of the procedure  ?     the esophagus was clear of all food and fluid. ?     A medium-sized, fungating mass was found in the lower third of the  ?     esophagus, 35 cm from the incisors. The mass extends to the GE junction  ?     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  ?     The mass was non-obstructing and partially circumferential (involving  ?     one-half of the lumen circumference). Biopsies were taken with a cold  ?     forceps for histology. ?     A medium amount of food (residue) was found in the cardia, in the  ?     gastric fundus and in  the gastric body. This prevented complete views of  ?     the proximal stomach and obscured a good retroflexed view of the  ?     cardia/fundus. ?     The exam of the distal stomach was otherwise normal. ?     The examined duodenum was normal. ?Impression:               - Food in the middle third of the esophagus and in  ?                          the lower third of the esophagus. Removal was  ?                           successful. ?                          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                          the lower third of the esophagus (35-40 cm from the  ?                          incisors). Biopsied. ?                          - A medium amount of food (residue) in the stomach  ?                          preventing adequate gastric retroflexed view. ?                          - Normal examined duodenum. ?Moderate Sedation: ?     N/A ?Recommendation:           - Patient has a contact number available for  ?                          emergencies. The signs and symptoms of potential  ?                          delayed complications were discussed with the  ?                          patient. Return to normal activities tomorrow.  ?                          Written discharge instructions were provided to the  ?                          patient. ?                          - Soft diet. Small bites, chew food extremely well. ?                          - Continue present medications. ?                          -  Await pathology results. ?                          - [facility] later this  ?                          week. The barium esophagram will not be necessary  ?                          at this point. ?                          - Anticipate surgical and oncology referral (which  ?                          can be through the [place] system if patient preference). ?Procedure Code(s):        --- Professional --- ?                          [id number], Esophagogastroduodenoscopy, flexible,  ?                          transoral; with removal of foreign body(s) ?                          50093, Esophagogastroduodenoscopy, flexible,  ?                          transoral; with biopsy, single or multiple ?Diagnosis Code(s):        --- Professional --- ?                          G18.299B, Food in esophagus causing other injury,  ?                          initial encounter ?                           D49.0, Neoplasm of unspecified behavior of  ?                          digestive system ?                          T18.2XXA, Foreign body in stomach, initial encounter ?                          T18.108A, Unspecified foreign body in esophagus  ?                          causing other injury, initial encounter ?CPT copyright 2019 [employer]. All rights reserved. ?The codes documented in this report are preliminary and upon coder review may  ?be revised to meet current compliance requirements. ?Jerene Bears, MD ?11/13/2021 11:50:24 PM ?This report has been signed electronically. ?Number of Addenda: 0 ?

## 2021-11-13 NOTE — ED Provider Notes (Signed)
?[facility] ?Provider Note ? ? ?CSN: [id number] ?Arrival date & time: 11/13/21  1951 ? ?  ? ?History ? ?Chief Complaint  ?Patient presents with  ? Dysphagia  ? ? ?Keith Moreno is a 61 y.o. male. ? ?HPI ? ?Patient states he has been having issues with dysphagia for a little while now.  He has seen his primary care doctor and was referred to the GI doctor at the [facility] and also scheduled to have a barium swallow study this coming week.  Patient was eating a piece of meat this evening just about an hour ago and felt like it got lodged in his esophagus.  Patient is unable to swallow the food completely.  He still feels like it stuck in his esophagus.  He tried drinking water but it will not go down and he just spits it back up.  He is having difficulty handling his secretions and keeps on spitting up his saliva.  He is not having any trouble speaking he does not feel short of breath. ? ?Home Medications ?Prior to Admission medications   ?Medication Sig Start Date End Date Taking? Authorizing Provider  ?Alogliptin Benzoate 25 MG TABS Take 0.5 tablets by mouth daily. 09/30/21   [provider]  ?amLODipine (NORVASC) 10 MG tablet Take 10 mg by mouth daily.    [provider]  ?cefdinir (OMNICEF) 300 MG capsule Take 1 capsule (300 mg total) by mouth 2 (two) times daily. 07/26/19   Jaynee Eagles, PA-C  ?erythromycin ophthalmic ointment Place a 1/2 inch ribbon of ointment into the lower eyelid of right eye every 4 hours. 07/26/19   Jaynee Eagles, PA-C  ?lisinopril (PRINIVIL,ZESTRIL) 40 MG tablet Take 40 mg by mouth daily.    [provider]  ?pioglitazone (ACTOS) 30 MG tablet TAKE ONE TABLET BY MOUTH EVERY MORNING FOR DIABETES 09/30/21   [provider]  ?   ? ?Allergies    ?Procaine, Chocolate, and Other   ? ?Review of Systems   ?Review of Systems  ?Constitutional:  Negative for fever.  ? ?Physical Exam ?Updated Vital Signs ?BP (!) 165/80 (BP Location: Right  Arm)   Pulse 85   Temp 99.2 ?F (37.3 ?C) (Oral)   Resp 18   SpO2 97%  ?Physical Exam ?Vitals and nursing note reviewed.  ?Constitutional:   ?   Appearance: He is well-developed.  ?   Comments: Appears uncomfortable, constantly spitting saliva into a bag  ?HENT:  ?   Head: Normocephalic and atraumatic.  ?   Right Ear: External ear normal.  ?   Left Ear: External ear normal.  ?Eyes:  ?   General: No scleral icterus.    ?   Right eye: No discharge.     ?   Left eye: No discharge.  ?   Conjunctiva/sclera: Conjunctivae normal.  ?Neck:  ?   Trachea: No tracheal deviation.  ?Cardiovascular:  ?   Rate and Rhythm: Normal rate and regular rhythm.  ?Pulmonary:  ?   Effort: Pulmonary effort is normal. No respiratory distress.  ?   Breath sounds: Normal breath sounds. No stridor. No wheezing or rales.  ?Abdominal:  ?   General: Bowel sounds are normal. There is no distension.  ?   Palpations: Abdomen is soft.  ?   Tenderness: There is no abdominal tenderness. There is no guarding or rebound.  ?Musculoskeletal:     ?   General: No tenderness or deformity.  ?   Cervical  back: Neck supple.  ?Skin: ?   General: Skin is warm and dry.  ?   Findings: No rash.  ?Neurological:  ?   General: No focal deficit present.  ?   Mental Status: He is alert.  ?   Cranial Nerves: No cranial nerve deficit (no facial droop, extraocular movements intact, no slurred speech).  ?   Sensory: No sensory deficit.  ?   Motor: No abnormal muscle tone or seizure activity.  ?   Coordination: Coordination normal.  ?Psychiatric:     ?   Mood and Affect: Mood normal.  ? ? ?ED Results / Procedures / Treatments   ?Labs ?(all labs ordered are listed, but only abnormal results are displayed) ?Labs Reviewed - No data to display ? ?EKG ?None ? ?Radiology ?No results found. ? ?Procedures ?Procedures  ? ? ?Medications Ordered in ED ?Medications - No data to display ? ?ED Course/ Medical Decision Making/ A&P ?Clinical Course as of 11/13/21 2337  ?Sun Nov 13, 2021   ?2105 Waiting for GI to call back.  We will try a dose of glucagon in the interim [JK]  ?2116 Case discussed with Dr. Hilarie Fredrickson.  He will come in to evaluate the patient [JK]  ?2154 I-stat chem 8, ED (not at [facility] or [facility])(!) [JK]  ?2154 I-stat chem 8, ED (not at [facility] or [facility])(!) ?Creatinine elevated 2.0.  Previous labs were 6 years ago [JK]  ?2154 CBC(!) ?Normal [JK]  ?2156 Creatinine was 1.3 on March 30 [JK]  ?2156 We will proceed with fluid bolus considering his elevated creatinine [JK]  ?  ?Clinical Course User Index ?[JK] Dorie Rank, MD  ? ?                        ?Medical Decision Making ?Presentation concerning for an esophageal food impaction.  No signs of airway compromise.  We will plan on GI consultation ? ?Amount and/or Complexity of Data Reviewed ?Labs: ordered. Decision-making details documented in ED Course. ? ?Risk ?Prescription drug management. ? ? ?Patient presented to ED for evaluation of esophageal food impaction.  Creatinine increased from baseline.  Likely related to aki from the vomiting.  Pt given IV fluids.  Seen by Dr Hilarie Fredrickson GI for endoscopy. ? ? ? ? ? ? ? ?Final Clinical Impression(s) / ED Diagnoses ?Esophageal food impaction, AKI ? ?Rx / DC Orders ?ED Discharge Orders   ? ? None  ? ?  ? ? ?  ?Dorie Rank, MD ?11/13/21 2352 ? ?

## 2021-11-13 NOTE — Anesthesia Preprocedure Evaluation (Addendum)
Anesthesia Evaluation  ?Patient identified by MRN, date of birth, ID band ?Patient awake ? ? ? ?Reviewed: ?Allergy & Precautions, NPO status , Patient's Chart, lab work & pertinent test results ? ?Airway ?Mallampati: I ? ?TM Distance: >3 FB ?Neck ROM: Full ? ? ? Dental ? ?(+) Missing, Dental Advisory Given, Edentulous Upper ?  ?Pulmonary ?former smoker,  ?  ? ?+ decreased breath sounds ? ? ? ? ? Cardiovascular ?hypertension,  ?Rhythm:Regular Rate:Normal ? ? ?  ?Neuro/Psych ?  ? GI/Hepatic ?negative GI ROS, Neg liver ROS,   ?Endo/Other  ?negative endocrine ROS ? Renal/GU ?negative Renal ROS  ? ?  ?Musculoskeletal ?negative musculoskeletal ROS ?(+)  ? Abdominal ?Normal abdominal exam  (+)   ?Peds ? Hematology ?negative hematology ROS ?(+)   ?Anesthesia Other Findings ? ? Reproductive/Obstetrics ? ?  ? ? ? ? ? ? ? ? ? ? ? ? ? ?  ?  ? ? ? ? ? ? ? ?Anesthesia Physical ?Anesthesia Plan ? ?ASA: 2 and emergent ? ?Anesthesia Plan: General  ? ?Post-op Pain Management:   ? ?Induction: Rapid sequence and Cricoid pressure planned ? ?PONV Risk Score and Plan: Ondansetron and Midazolam ? ?Airway Management Planned: Oral ETT ? ?Additional Equipment: None ? ?Intra-op Plan:  ? ?Post-operative Plan: Extubation in OR ? ?Informed Consent: I have reviewed the patients History and Physical, chart, labs and discussed the procedure including the risks, benefits and alternatives for the proposed anesthesia with the patient or authorized representative who has indicated his/her understanding and acceptance.  ? ? ? ?Dental advisory given ? ?Plan Discussed with: CRNA ? ?Anesthesia Plan Comments:   ? ? ? ? ? ?Anesthesia Quick Evaluation ? ?

## 2021-11-13 NOTE — Transfer of Care (Signed)
Immediate Anesthesia Transfer of Care Note ? ?Patient: Keith Moreno ? ?Procedure(s) Performed: ESOPHAGOGASTRODUODENOSCOPY (EGD) ?FOREIGN BODY REMOVAL ?BIOPSY ? ?Patient Location: PACU ? ?Anesthesia Type:General ? ?Level of Consciousness: awake, alert  and oriented ? ?Airway & Oxygen Therapy: Patient Spontanous Breathing ? ?Post-op Assessment: Report given to RN and Post -op Vital signs reviewed and stable ? ?Post vital signs: Reviewed and stable ? ?Last Vitals:  ?Vitals Value Taken Time  ?BP 175/79 11/13/21 2354  ?Temp    ?Pulse 84 11/13/21 2359  ?Resp 23 11/13/21 2359  ?SpO2 93 % 11/13/21 2359  ?Vitals shown include unvalidated device data. ? ?Last Pain:  ?Vitals:  ? 11/13/21 2202  ?TempSrc:   ?PainSc: 0-No pain  ?   ? ?  ? ?Complications: No notable events documented. ?

## 2021-11-13 NOTE — Consult Note (Signed)
? ?Referring Provider: Dorie Rank [facility] Emergency Dept)          ?Primary Care Physician:  [facility]  ?Primary Gastroenterologist:  unassigned, referred to GI in [facility] with consult pending           ?Reason for Consultation:  acute esophageal food impaction             ? ? ?ASSESSMENT /  PLAN   ?61 yo with acute esophageal food impaction ? ?Emergent EGD with MAC ?NPO ?Will need to follow-up as scheduled with [facility] GI service likely for dilation ? ?The nature of the procedure, as well as the risks, benefits, and alternatives were carefully and thoroughly reviewed with the patient. Ample time for discussion and questions allowed. The patient understood, was satisfied, and agreed to proceed.  ? ? ? ? ?HPI:   ? ? ?Keith Moreno is a 61 y.o. male with PMH of DM, HTN, metabolic syndrome, obesity, PTSD, recent report to PCP in [place] system of esophageal dysphagia presenting to ED with acute esophageal obstruction.  Occurred this evening while eating a meal including roast beef.  Since onset he has been unable to swallow liquids including saliva. ? ?4 episodes since Nov 2022, this one the longest by far.  Had been forcing down food with water for 1st 3 episodes. ?No heartburn ?No weight loss or abd pain ? ?[facility]ium esophagram [facility] for Tuesday this week [facility]. ? ?No prior EGD ?Has had colonoscopy. ? ?Mild chest discomfort.  No dyspnea or pleuritic pain. ? ? ?Past Medical History:  ?Diagnosis Date  ? Hypertension   ?DM2 ?Obesity ?PTSD ?Metabolic syndrome ? ?History reviewed. No pertinent surgical history. ?Hx ORIF ? ?Prior to Admission medications   ?Medication Sig Start Date End Date Taking? Authorizing Provider  ?Alogliptin Benzoate 25 MG TABS Take 0.5 tablets by mouth daily. 09/30/21   [provider]  ?amLODipine (NORVASC) 10 MG tablet Take 10 mg by mouth daily.    [provider]  ?cefdinir (OMNICEF) 300 MG capsule Take 1 capsule (300 mg total) by mouth 2  (two) times daily. 07/26/19   Jaynee Eagles, PA-C  ?erythromycin ophthalmic ointment Place a 1/2 inch ribbon of ointment into the lower eyelid of right eye every 4 hours. 07/26/19   Jaynee Eagles, PA-C  ?lisinopril (PRINIVIL,ZESTRIL) 40 MG tablet Take 40 mg by mouth daily.    [provider]  ?pioglitazone (ACTOS) 30 MG tablet TAKE ONE TABLET BY MOUTH EVERY MORNING FOR DIABETES 09/30/21   [provider]  ? ? ?Current Facility-Administered Medications  ?Medication Dose Route Frequency Provider Last Rate Last Admin  ? glucagon (human recombinant) (GLUCAGEN) injection 1 mg  1 mg Intravenous Once Dorie Rank, MD      ? ?Current Outpatient Medications  ?Medication Sig Dispense Refill  ? Alogliptin Benzoate 25 MG TABS Take 0.5 tablets by mouth daily.    ? amLODipine (NORVASC) 10 MG tablet Take 10 mg by mouth daily.    ? cefdinir (OMNICEF) 300 MG capsule Take 1 capsule (300 mg total) by mouth 2 (two) times daily. 20 capsule 0  ? erythromycin ophthalmic ointment Place a 1/2 inch ribbon of ointment into the lower eyelid of right eye every 4 hours. 3.5 g 0  ? lisinopril (PRINIVIL,ZESTRIL) 40 MG tablet Take 40 mg by mouth daily.    ? pioglitazone (ACTOS) 30 MG tablet TAKE ONE TABLET BY MOUTH EVERY MORNING FOR DIABETES    ? ? ?  Allergies as of 11/13/2021 - Review Complete 11/13/2021  ?Allergen Reaction Noted  ? Procaine  02/08/2015  ? Chocolate  01/08/2015  ? Other  01/08/2015  ? ? ?Family History  ?Problem Relation Age of Onset  ? Hypertension Mother   ? Diabetes Mother   ? Bipolar disorder Mother   ? Hypertension Father   ? ? ?Social History  ? ?Tobacco Use  ? Smoking status: Former  ?  Types: Cigarettes  ?  Quit date: 20  ?  Years since quitting: 43.3  ? Smokeless tobacco: Never  ?Vaping Use  ? Vaping Use: Never used  ?Substance Use Topics  ? Alcohol use: No  ? Drug use: No  ? ? ?Review of Systems: ?All systems reviewed and negative except where noted in HPI. ? ?Physical Exam: ?Vital signs in last 24  hours: ?Temp:  [99.2 ?F (37.3 ?C)] 99.2 ?F (37.3 ?C) (05/07 1955) ?Pulse Rate:  [84-85] 84 (05/07 2100) ?Resp:  [13-18] 13 (05/07 2100) ?BP: (155-165)/(80-96) 155/94 (05/07 2100) ?SpO2:  [94 %-97 %] 94 % (05/07 2100) ?  ?Gen: awake, alert, NAD ?HEENT: anicteric, op clear ?CV: RRR, no mrg ?Pulm: CTA b/l ?Abd: soft, obese, NT/ND, +BS throughout ?Ext: no c/c/e ?Neuro: nonfocal ? ? ?Intake/Output from previous day: ?No intake/output data recorded. ?Intake/Output this shift: ?No intake/output data recorded. ? ?Lab Results: ?Recent Labs  ?  11/13/21 ?2122  ?HGB 12.6*  ?HCT 37.0*  ? ?BMET ?Recent Labs  ?  11/13/21 ?2122  ?NA 139  ?K 3.8  ?CL 103  ?GLUCOSE 103*  ?BUN 27*  ?CREATININE 2.00*  ? ?LFT ?No results for input(s): PROT, ALBUMIN, AST, ALT, ALKPHOS, BILITOT, BILIDIR, IBILI in the last 72 hours. ?PT/INR ?No results for input(s): LABPROT, INR in the last 72 hours. ?Hepatitis Panel ?No results for input(s): HEPBSAG, HCVAB, HEPAIGM, HEPBIGM in the last 72 hours. ? ? ?. ? ?  Latest Ref Rng & Units 11/13/2021  ?  9:22 PM 01/08/2015  ?  3:23 PM  ?CBC  ?WBC 4.0 - 10.5 K/uL  8.5    ?Hemoglobin 13.0 - 17.0 g/dL 12.6   14.8    ?Hematocrit 39.0 - 52.0 % 37.0   45.5    ?Platelets 150 - 400 K/uL  227    ? ? ?. ? ?  Latest Ref Rng & Units 11/13/2021  ?  9:22 PM 01/08/2015  ?  3:23 PM  ?CMP  ?Glucose 70 - 99 mg/dL 103   106    ?BUN 8 - 23 mg/dL 27   16    ?Creatinine 0.61 - 1.24 mg/dL 2.00   1.11    ?Sodium 135 - 145 mmol/L 139   136    ?Potassium 3.5 - 5.1 mmol/L 3.8   4.4    ?Chloride 98 - 111 mmol/L 103   100    ?CO2 22 - 32 mmol/L  26    ?Calcium 8.9 - 10.3 mg/dL  9.1    ?Total Protein 6.5 - 8.1 g/dL  7.7    ?Total Bilirubin 0.3 - 1.2 mg/dL  1.0    ?Alkaline Phos 38 - 126 U/L  86    ?AST 15 - 41 U/L  28    ?ALT 17 - 63 U/L  56    ? ? ?Lajuan Lines. Caine Barfield, M.D. @  11/13/2021, 9:32 PM ? ? ? ? ?

## 2021-11-13 NOTE — Anesthesia Procedure Notes (Signed)
Procedure Name: Intubation ?Date/Time: 11/13/2021 10:34 PM ?Performed by: Trinna Post., CRNA ?Pre-anesthesia Checklist: Patient identified, Emergency Drugs available, Suction available, Patient being monitored and Timeout performed ?Patient Re-evaluated:Patient Re-evaluated prior to induction ?Oxygen Delivery Method: Circle system utilized ?Preoxygenation: Pre-oxygenation with 100% oxygen ?Induction Type: IV induction, Rapid sequence and Cricoid Pressure applied ?Laryngoscope Size: Mac and 4 ?Grade View: Grade I ?Tube type: Oral ?Tube size: 7.5 mm ?Number of attempts: 1 ?Airway Equipment and Method: Stylet ?Placement Confirmation: positive ETCO2, ETT inserted through vocal cords under direct vision and breath sounds checked- equal and bilateral ?Secured at: 22 cm ?Tube secured with: Tape ?Dental Injury: Teeth and Oropharynx as per pre-operative assessment  ? ? ? ? ?

## 2021-11-13 NOTE — ED Triage Notes (Addendum)
Pt arrives to triage ambulatory with c/o food stuck in his throat. Pt reports he was eating dinner when he got startled and swallowed a piece of roast beef before he was about to chew it completely. Pt reports he can still fill it in this throat and also reports he unable to keep liquids down. Pt is able to swallow secretions.Pt airway intact and pt is able to speak in full and complete sentences.  ?

## 2021-11-14 MED ORDER — ONDANSETRON HCL 4 MG/2ML IJ SOLN: 4.0000 mg | Freq: Once | INTRAMUSCULAR | Status: DC | PRN

## 2021-11-14 MED ORDER — AMISULPRIDE (ANTIEMETIC) 5 MG/2ML IV SOLN: 10.0000 mg | Freq: Once | INTRAVENOUS | Status: DC | PRN

## 2021-11-14 NOTE — Discharge Instructions (Signed)
Dr Hilarie Fredrickson has a printed instructions page that was given to patient. He will call with results of biopsy (possibly Tuesday). He will also send information over to [place] to start follow up. [facility] with who and [facility]. No barium swallow is needed. A CT scan will be ordered by [facility] as part of your follow up through them.  ?

## 2021-11-14 NOTE — Anesthesia Postprocedure Evaluation (Signed)
Anesthesia Post Note ? ?Patient: Usbaldo Pannone ? ?Procedure(s) Performed: ESOPHAGOGASTRODUODENOSCOPY (EGD) ?FOREIGN BODY REMOVAL ?BIOPSY ? ?  ? ?Patient location during evaluation: PACU ?Anesthesia Type: General ?Level of consciousness: awake and alert ?Pain management: pain level controlled ?Vital Signs Assessment: post-procedure vital signs reviewed and stable ?Respiratory status: spontaneous breathing, nonlabored ventilation, respiratory function stable and patient connected to nasal cannula oxygen ?Cardiovascular status: blood pressure returned to baseline and stable ?Postop Assessment: no apparent nausea or vomiting ?Anesthetic complications: no ? ? ?No notable events documented. ?  ?  ?  ?  ?  ? ?Effie Berkshire ? ? ? ? ?

## 2021-11-15 ENCOUNTER — Encounter (HOSPITAL_COMMUNITY): Payer: Self-pay | Admitting: Internal Medicine

## 2021-11-24 ENCOUNTER — Other Ambulatory Visit (HOSPITAL_COMMUNITY): Payer: Self-pay | Admitting: Internal Medicine

## 2021-11-24 LAB — SURGICAL PATHOLOGY

## 2021-11-25 ENCOUNTER — Other Ambulatory Visit (HOSPITAL_COMMUNITY): Payer: Self-pay | Admitting: Internal Medicine

## 2021-11-25 DIAGNOSIS — C159 Malignant neoplasm of esophagus, unspecified: Secondary | ICD-10-CM

## 2021-11-29 ENCOUNTER — Encounter: Payer: Self-pay | Admitting: Orthopaedic Surgery

## 2021-11-29 ENCOUNTER — Encounter (HOSPITAL_COMMUNITY): Payer: Self-pay

## 2021-11-29 ENCOUNTER — Ambulatory Visit (INDEPENDENT_AMBULATORY_CARE_PROVIDER_SITE_OTHER): Payer: Worker's Compensation | Admitting: Orthopaedic Surgery

## 2021-11-29 VITALS — BP 156/86 | HR 64 | Ht 72.0 in | Wt 350.0 lb

## 2021-11-29 DIAGNOSIS — M545 Low back pain, unspecified: Secondary | ICD-10-CM | POA: Diagnosis not present

## 2021-11-29 NOTE — Progress Notes (Signed)
Office Visit Note   Patient: Keith Moreno           Date of Birth: 12-16-1960           MRN: [id number] Visit Date: 11/29/2021              Requested by: No referring provider defined for this encounter. PCP: Pcp, No   Assessment & Plan: Visit Diagnoses:  1. Low back pain without sciatica, unspecified back pain laterality, unspecified chronicity     Plan: Work slip given for work resumption June/06/2022.  Based on his on-the-job injury lumbar strain impairment is rated at 5% of the back patient is at [facility] office follow-up as needed.  Follow-Up Instructions: No follow-ups on file.   Orders:  No orders of the defined types were placed in this encounter.  No orders of the defined types were placed in this encounter.     Procedures: No procedures performed   Clinical Data: No additional findings.   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  FCE showed good validity.  His work activity found he was and light physical demand category by the [place] [employer] guidelines and is able to lift floor to waist 30 pounds, waist to shoulder lift 30 pounds shoulder to overhead lift 20 pounds max and 2 hand carry 30 pounds.  Work slip given for work resumption as of December 19, 2021.  He states he is has some other health problems which is getting ready to be evaluated shortly.  Review of Systems all the systems are noncontributory other than as mentioned above.   Objective: Vital Signs: BP (!) 156/86   Pulse 64   Ht 6' (1.829 m)   Wt (!) 350 lb (158.8 kg)   BMI 47.47 kg/m   Physical Exam Constitutional:      Appearance: He is well-developed.  HENT:     Head: Normocephalic and atraumatic.     Right Ear: External ear normal.     Left Ear: External ear normal.  Eyes:     Pupils: Pupils are equal, round, and  reactive to light.  Neck:     Thyroid: No thyromegaly.     Trachea: No tracheal deviation.  Cardiovascular:     Rate and Rhythm: Normal rate.  Pulmonary:     Effort: Pulmonary effort is normal.     Breath sounds: No wheezing.  Abdominal:     General: Bowel sounds are normal.     Palpations: Abdomen is soft.  Musculoskeletal:     Cervical back: Neck supple.  Skin:    General: Skin is warm and dry.     Capillary Refill: Capillary refill takes less than 2 seconds.  Neurological:     Mental Status: He is alert and oriented to person, place, and time.  Psychiatric:        Behavior: Behavior normal.        Thought Content: Thought content normal.        Judgment: Judgment normal.    Ortho Exam patient gets from sitting standing normal heel-toe gait no lower extremity atrophy no rash or exposed skin.  Specialty Comments:  No specialty comments available.  Imaging: No results found.   PMFS History: Patient Active Problem List   Diagnosis Date Noted   Esophageal obstruction due to food  impaction    Esophageal mass    Low back pain 11/01/2021   Past Medical History:  Diagnosis Date   Hypertension     Family History  Problem Relation Age of Onset   Hypertension Mother    Diabetes Mother    Bipolar disorder Mother    Hypertension Father     Past Surgical History:  Procedure Laterality Date   BIOPSY  11/13/2021   Procedure: BIOPSY;  Surgeon: Jerene Bears, MD;  Location: [facility] ENDOSCOPY;  Service: Gastroenterology;;   ESOPHAGOGASTRODUODENOSCOPY N/A 11/13/2021   Procedure: ESOPHAGOGASTRODUODENOSCOPY (EGD);  Surgeon: Jerene Bears, MD;  Location: [facility] ENDOSCOPY;  Service: Gastroenterology;  Laterality: N/A;   FOREIGN BODY REMOVAL  11/13/2021   Procedure: FOREIGN BODY REMOVAL;  Surgeon: Jerene Bears, MD;  Location: [facility] ENDOSCOPY;  Service: Gastroenterology;;   Social History   Occupational History   Not on file  Tobacco Use   Smoking status: Former    Types: Cigarettes     Quit date: 1980    Years since quitting: 43.4   Smokeless tobacco: Never  Vaping Use   Vaping Use: Never used  Substance and Sexual Activity   Alcohol use: No   Drug use: No   Sexual activity: Not on file

## 2021-11-30 ENCOUNTER — Ambulatory Visit (HOSPITAL_COMMUNITY)
Admission: RE | Admit: 2021-11-30 | Discharge: 2021-11-30 | Disposition: A | Discharge status: Home / Self Care | Payer: No Typology Code available for payment source | Source: Ambulatory Visit | Attending: Internal Medicine | Admitting: Internal Medicine

## 2021-11-30 DIAGNOSIS — C159 Malignant neoplasm of esophagus, unspecified: Secondary | ICD-10-CM | POA: Insufficient documentation

## 2021-11-30 LAB — GLUCOSE, CAPILLARY: Glucose-Capillary: 111 mg/dL — ABNORMAL HIGH (ref 70–99)

## 2021-11-30 MED ORDER — FLUDEOXYGLUCOSE F - 18 (FDG) INJECTION
16.0000 | Freq: Once | INTRAVENOUS | Status: AC | PRN
  Administered 2021-11-30: 15.5 via INTRAVENOUS

## 2021-12-02 NOTE — Progress Notes (Signed)
GI Location of Tumor / Histology: Esophagus  Dayan Omar presented with complaints of difficulty of food going down. He presented to the ER with an episode of food impaction.  EGD was done at that time.  PET 11/30/2021:   EGD 11/14/2021: Medium sized fungating mass in the lower third of the esophagus, 35 cm from the incisors.  The mass extends to the GE junction at 40 cm and appears to arise in the background of Barrett's Esophagus.  The mass was non obstructing and partially circumferential.   Biopsies of Esophageal Mass 11/13/2021   Past/Anticipated interventions by surgeon, if any:   Past/Anticipated interventions by medical oncology, if any:  Dr. Streer / Dr. Addo 11/23/2021 -We have requested testing for HER-2, PD-L1 and MMR. -Stat PET to complete staging workup. -Pending PET, will determine appropriate next step for staging, EUS for localized disease or other specialty to biopsy lesion if patient has metastatic disease. -Consult to RadOnc via community care. -Consider surgical consult for placement of feeding tube if patient is not getting adequate calories in.    Weight changes, if any: He has lost a few pounds.  Bowel/Bladder complaints, if any:   Nausea / Vomiting, if any:   Pain issues, if any:    Appetite: He is eating only pureed and liquid foods.  SAFETY ISSUES: Prior radiation?  Pacemaker/ICD?  Possible current pregnancy? N/a Is the patient on methotrexate?   Current Complaints/Details:  

## 2021-12-06 ENCOUNTER — Encounter: Payer: Self-pay | Admitting: Radiation Oncology

## 2021-12-06 ENCOUNTER — Ambulatory Visit
Admission: RE | Admit: 2021-12-06 | Discharge: 2021-12-06 | Disposition: A | Discharge status: Home / Self Care | Payer: No Typology Code available for payment source | Source: Ambulatory Visit | Attending: Radiation Oncology | Admitting: Radiation Oncology

## 2021-12-06 ENCOUNTER — Other Ambulatory Visit: Payer: Self-pay

## 2021-12-06 VITALS — BP 129/74 | HR 63 | Temp 97.9°F | Resp 20 | Ht 72.0 in | Wt 345.0 lb

## 2021-12-06 DIAGNOSIS — N200 Calculus of kidney: Secondary | ICD-10-CM | POA: Diagnosis not present

## 2021-12-06 DIAGNOSIS — C155 Malignant neoplasm of lower third of esophagus: Secondary | ICD-10-CM | POA: Insufficient documentation

## 2021-12-06 DIAGNOSIS — K5730 Diverticulosis of large intestine without perforation or abscess without bleeding: Secondary | ICD-10-CM | POA: Diagnosis not present

## 2021-12-06 DIAGNOSIS — R590 Localized enlarged lymph nodes: Secondary | ICD-10-CM | POA: Insufficient documentation

## 2021-12-06 DIAGNOSIS — Z79899 Other long term (current) drug therapy: Secondary | ICD-10-CM | POA: Diagnosis not present

## 2021-12-06 DIAGNOSIS — Z87891 Personal history of nicotine dependence: Secondary | ICD-10-CM | POA: Diagnosis not present

## 2021-12-06 DIAGNOSIS — R918 Other nonspecific abnormal finding of lung field: Secondary | ICD-10-CM | POA: Insufficient documentation

## 2021-12-06 DIAGNOSIS — I10 Essential (primary) hypertension: Secondary | ICD-10-CM | POA: Insufficient documentation

## 2021-12-06 DIAGNOSIS — Z7984 Long term (current) use of oral hypoglycemic drugs: Secondary | ICD-10-CM | POA: Diagnosis not present

## 2021-12-06 DIAGNOSIS — K4090 Unilateral inguinal hernia, without obstruction or gangrene, not specified as recurrent: Secondary | ICD-10-CM | POA: Diagnosis not present

## 2021-12-06 NOTE — Progress Notes (Addendum)
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   HISTORY OF PRESENT ILLNESS: Keith Moreno is a 61 y.o. male seen at the request of Dr. Arnold Long one of  the fellows heme-onc clinic at the [place].  The patient lives in [place] and presented to the emergency department with complaints of dysphagia.  He underwent EGD on 11/13/2021 with Dr. Hilarie Fredrickson showing a medium sized fungating mass in the lower third of the esophagus 35 cm from the incisors and extending to the GE junction.  It appears to have arisen in the background of Barrett's esophagus, the mass was nonobstructing and partially circumferential and biopsies showed moderately to poorly differentiated adenocarcinoma. He underwent a PET scan on 11/30/2021 that showed hypermetabolic activity in the distal esophagus with an SUV of 7.7 with thickening measuring up to 15 mm and spanning just to above the GE junction.  Diffuse mediastinal adenopathy showed dense calcification in all nearly symmetric findings.  The SUV in the left hilum was 8.22 the right paratracheal chain was 9.76, and a paraesophageal node that had been present in 2016 by CT imaging was stable.  The nodes track along the right greater than left mediastinum and largely spare the mediastinum anteriorly.  A right upper lobe nodule measured 7 mm with an SUV of 1.  A 12 mm lymph node adjacent to the right diaphragmatic crus showed an SUV of 10.28 and no visible calcium was seen. Diffuse uptake of the prostate was also noted.  He is seen today to discuss treatment recommendations of his cancer.  He has decided to proceed with all of his treatment in [place].  Dr. Arnold Long is in agreement but would  like to see him in surveillance after completing therapy.    PREVIOUS RADIATION THERAPY: No   PAST MEDICAL HISTORY:  Past Medical History:  Diagnosis Date   Hypertension        PAST SURGICAL HISTORY: Past Surgical History:  Procedure Laterality Date   BIOPSY  11/13/2021   Procedure: BIOPSY;  Surgeon: Jerene Bears, MD;  Location: [facility] ENDOSCOPY;  Service: Gastroenterology;;   ESOPHAGOGASTRODUODENOSCOPY N/A 11/13/2021   Procedure: ESOPHAGOGASTRODUODENOSCOPY (EGD);  Surgeon: Jerene Bears, MD;  Location: [facility] ENDOSCOPY;  Service: Gastroenterology;  Laterality: N/A;   FOREIGN BODY REMOVAL  11/13/2021   Procedure: FOREIGN BODY REMOVAL;  Surgeon: Jerene Bears, MD;  Location: [facility] ENDOSCOPY;  Service: Gastroenterology;;     FAMILY HISTORY:  Family History  Problem Relation Age of Onset   Hypertension Mother    Diabetes Mother    Bipolar disorder Mother    Hypertension Father      SOCIAL HISTORY:  reports that he quit smoking about 43 years ago. His smoking use included cigarettes. He has never used smokeless tobacco. He reports current alcohol use. He reports that he does not use drugs.  The patient is in a committed relationship with his partner Zenovia Jordan. He is a Administrator, and they live close near the office. Sudie Grumbling spends much of her time with her grandchildren whom they see often.   ALLERGIES: Procaine, Chocolate, and Other   MEDICATIONS:  Current Outpatient Medications  Medication Sig Dispense  Refill   acetaminophen (TYLENOL) 500 MG tablet Take 1 tablet by mouth at bedtime.     Alogliptin Benzoate 25 MG TABS Take 0.5 tablets by mouth daily.     amLODipine (NORVASC) 10 MG tablet Take 10 mg by mouth daily.     amLODipine (NORVASC) 10 MG tablet Take 1 tablet by mouth at bedtime.     atenolol (TENORMIN) 25 MG tablet TAKE ONE-HALF TABLET BY MOUTH DAILY FOR BLOOD PRESSURE     Cholecalciferol (VITAMIN D3) 10 MCG (400 UNIT) tablet Take by mouth.     ibuprofen (ADVIL) 400 MG tablet  Take 1 tablet by mouth at bedtime.     lidocaine (LIDODERM) 5 % APPLY 1 PATCH TO SKIN ONCE DAILY BACK PAIN (APPLY FOR 12 HOURS, THEN REMOVE FOR 12 HOURS)     lisinopril (PRINIVIL,ZESTRIL) 40 MG tablet Take 40 mg by mouth daily.     lisinopril (ZESTRIL) 40 MG tablet Take by mouth.     Melatonin 10 MG TABS Take by mouth.     olopatadine (PATANOL) 0.1 % ophthalmic solution INSTILL 1 DROP IN BOTH EYES TWICE A DAY FOR ALLERGIES     Omega-3 Fatty Acids (ODORLESS COATED FISH OIL) 1000 MG CPDR Take 1 capsule by mouth at bedtime.     pioglitazone (ACTOS) 30 MG tablet TAKE ONE TABLET BY MOUTH EVERY MORNING FOR DIABETES     potassium chloride (MICRO-K) 10 MEQ CR capsule Take 10 mEq by mouth 2 (two) times daily.     terbinafine (LAMISIL) 250 MG tablet TAKE ONE TABLET BY MOUTH DAILY FOR FUNGAL INFECTION     No current facility-administered medications for this encounter.     REVIEW OF SYSTEMS: On review of systems, the patient reports that he is struggling with foreign body sensation with swallowing. He is tolerating liquids and pureed foods. No He's lost about 15 pounds in the last month unintentionally and has had a dry cough at night time that his PCP believes is from his ACEi. No other complaints are verbalized.       PHYSICAL EXAM:  Wt Readings from Last 3 Encounters:  12/06/21 (!) 345 lb (156.5 kg)  11/29/21 (!) 350 lb (158.8 kg)  11/13/21 (!) 350 lb (158.8 kg)   Temp Readings from Last 3 Encounters:  12/06/21 97.9 F (36.6 C)  11/14/21 98.1 F (36.7 C)  07/26/19 98.7 F (37.1 C) (Oral)   BP Readings from Last 3 Encounters:  12/06/21 129/74  11/29/21 (!) 156/86  11/14/21 (!) 151/81   Pulse Readings from Last 3 Encounters:  12/06/21 63  11/29/21 64  11/14/21 77   Pain Assessment Pain Score: 4 /10  In general this is a well appearing caucasian male in no acute distress. He's alert and oriented x4 and appropriate throughout the examination. Cardiopulmonary assessment is negative  for acute distress and he exhibits normal effort.     ECOG = 1  0 - Asymptomatic (Fully active, able to carry on all predisease activities without restriction)  1 - Symptomatic but completely ambulatory (Restricted in physically strenuous activity but ambulatory and able to carry out work of a light or sedentary nature. For example, light housework, office work)  2 - Symptomatic, <50% in bed during the day (Ambulatory and capable of all self care but unable to carry out any work activities. Up and about more than 50% of waking hours)  3 - Symptomatic, >50% in bed, but not bedbound (Capable of only limited self-care, confined to bed or chair  50% or more of waking hours)  4 - Bedbound (Completely disabled. Cannot carry on any self-care. Totally confined to bed or chair)  5 - Death   Eustace Pen MM, Creech RH, Tormey DC, et al. [phone]). "Toxicity and response criteria of the [facility]". [place] Oncol. 5 (6): 649-55    LABORATORY DATA:  Lab Results  Component Value Date   WBC 6.0 11/13/2021   HGB 12.6 (L) 11/13/2021   HCT 37.0 (L) 11/13/2021   MCV 88.2 11/13/2021   PLT 198 11/13/2021   Lab Results  Component Value Date   NA 139 11/13/2021   K 3.8 11/13/2021   CL 103 11/13/2021   CO2 26 01/08/2015   Lab Results  Component Value Date   ALT 56 01/08/2015   AST 28 01/08/2015   ALKPHOS 86 01/08/2015   BILITOT 1.0 01/08/2015      RADIOGRAPHY: NM PET Image Initial (PI) Skull Base To Thigh (F-18 FDG)  Result Date: 12/02/2021 CLINICAL DATA:  Initial treatment strategy for esophageal adenocarcinoma in a 61 year old male. EXAM: NUCLEAR MEDICINE PET SKULL BASE TO THIGH TECHNIQUE: 15.5 mCi F-18 FDG was injected intravenously. Full-ring PET imaging was performed from the skull base to thigh after the radiotracer. CT data was obtained and used for attenuation correction and anatomic localization. Fasting blood glucose: 111 mg/dl COMPARISON:  Imaging from January 08, 2015. FINDINGS: Mediastinal blood pool activity: SUV max 3.17 Liver activity: SUV max NA NECK: Scattered small lymph nodes in the LEFT neck at level III and IV (image 43/4) maximum SUV 3.3 measuring approximately 7-8 mm. No additional areas in the neck of concern. Incidental CT findings: None CHEST: Distal esophageal uptake with a maximum SUV 7.7 (image 90/4) this is in an area of esophageal thickening at the distal esophagus measuring up to 15 mm with some eccentric thickening in this location. Metabolic activity spans a segment of the esophagus just above the GE junction. Diffuse mediastinal adenopathy many of these lymph nodes show dense calcification all nearly symmetric with respect to metabolic activity and many present on imaged portions of the chest included on the abdominal CT from January 08, 2015, for instance on image 67/4 maximum SUV of the LEFT hilum 8.22 and maximum SUV along RIGHT paratracheal chain of 9.76 also paraesophageal lymph nodes on image 87/4 were present on previous CT imaging and are little changed in terms of size showing intense metabolic activity with a maximum SUV 10.38. Lymph nodes track along the RIGHT greater the LEFT mediastinum and largely spare the mediastinum anteriorly. Is Small nodule in the knee RIGHT upper lobe (image 71/4) no substantial increased metabolic activity but this measures only 7 mm with a maximum SUV of 1.0 Incidental CT findings: Signs of mild aortic atherosclerosis and coronary artery disease mild cardiomegaly. ABDOMEN/PELVIS: 12 mm lymph node adjacent to the RIGHT diaphragmatic crus shows a maximum SUV of 10.28 and shows no visible calcium and was not enlarged on previous imaging. Generalized heterogeneous uptake of FDG throughout the liver. No focal area with suspicious features. Small hiatal hernia with heterogeneous uptake in the stomach below the diaphragm as well maximum SUV 5.89 (image 101/4) Incidental CT findings: Liver without acute process.  Gallbladder, pancreas, spleen and adrenal glands are unremarkable. Nephrolithiasis in the interpolar RIGHT kidney with a moderately large 10 mm calculus. No sign of hydronephrosis or perinephric stranding. Scattered small calculi in the LEFT kidney. Small LEFT renal cyst which does not require dedicated imaging follow-up on image 139  measuring approximately 1-1.2 cm. Urinary bladder is collapsed. Gastric and esophageal thickening as discussed. Signs of colonic diverticulosis. Normal appendix. No acute small bowel process. Heterogeneous uptake about the prostate showing a maximum SUV of approximately 5.7. Moderate RIGHT fat containing inguinal hernia. SKELETON: No focal hypermetabolic activity to suggest skeletal metastasis. Incidental CT findings: none IMPRESSION: Signs of esophageal uptake and thickening in this patient with known esophageal neoplasm likely represents the site of disease. Heterogeneity and potential thickening near the gastric fundus in the setting of small hiatal hernia is nonspecific and potentially physiologic. However, in light of distal esophageal neoplasm would correlate with the recent endoscopic results to ensure no gastric abnormality. Diffuse mediastinal adenopathy, areas imaged on prior studies are similar and many areas with calcification, pattern of adenopathy and uptake would favor process such as sarcoidosis. Unfortunately this confounds assessment for metastatic disease. The most suspicious node currently is a juxta crural lymph node on the RIGHT adjacent to the celiac axis though again this could be related to underlying sarcoidosis. No definitive signs of metastatic disease with above limitations as outlined. Nephrolithiasis. Colonic diverticulosis. Heterogeneous uptake in the prostate is nonspecific. Consider urologic follow-up with PSA as warranted. Electronically Signed   By: Zetta Bills M.D.   On: 12/02/2021 12:57       IMPRESSION/PLAN: 1. Adenocarcinoma of the distal  esophagus. Dr. Lisbeth Renshaw discusses the pathology findings and reviews the nature of esophageal cancer.  We reviewed the PET scan results and have discussed his case with his referring medical oncologist Dr. Arnold Long at the [facility].  She is in agreement for pulmonary referral to rule out metastatic disease versus prove sarcoidosis.  We also discussed referral to medical oncology in [place] for the active phase of his treatment and subsequent surveillance back at the [place].  With the patient we discussed the differences in approach to definitive chemoradiation versus palliative radiotherapy if he has metastatic disease. We will also copy [name] as the role of surgery is unknown at this time. If limited disease is noted, he is aware that he may need an EUS with Dr. Vena Rua colleagues. We discussed the risks, benefits, short, and long term effects of radiotherapy, as well as the differences in intent, and the patient is interested in proceeding with the appropriate therapy once we rule out metastatic disease.  Dr. Lisbeth Renshaw discusses the delivery and logistics of radiotherapy and anticipates a course of 3 or up to 5-1/2 weeks of radiotherapy.  We will follow-up with his results from EBUS after meeting with pulmonary, we will coordinate simulation at the appropriate time. 2. Hypermetabolic uptake in the prostate. Dr. Arnold Long plans to follow up with this at his next visit and plans to order a PSA to begin the work up.    In a visit lasting 60 minutes, greater than 50% of the time was spent face to face discussing the patient's condition, in preparation for the discussion, and coordinating the patient's care.   The above documentation reflects my direct findings during this shared patient visit. Please see the separate note by Dr. Lisbeth Renshaw on this date for the remainder of the patient's plan of care.    Carola Rhine, [facility]   **Disclaimer: This note was dictated with voice recognition software. Similar sounding words can  inadvertently be transcribed and this note may contain transcription errors which may not have been corrected upon publication of note.**

## 2021-12-07 ENCOUNTER — Other Ambulatory Visit: Payer: Self-pay

## 2021-12-07 ENCOUNTER — Telehealth: Payer: Self-pay

## 2021-12-07 NOTE — Progress Notes (Signed)
I spoke with Keith Octave, RN the oncology navigator at the [facility].  They are preparing the referral for the EUS and medical oncology.  The pulmonary referral was sent and he is seeing Dr Valeta Harms tomorrow.  Dr Kipp Brood received the [facility] referral and is waiting for complete work up. Dr Elita Boone and Dr Lisbeth Renshaw notified.

## 2021-12-07 NOTE — Progress Notes (Signed)
The proposed treatment discussed in conference is for discussion purpose only and is not a binding recommendation.  The patients have not been physically examined, or presented with their treatment options.  Therefore, final treatment plans cannot be decided.  

## 2021-12-07 NOTE — Telephone Encounter (Signed)
Thanks for making this telephone note. Hopefully we hear back about this soon and try to coordinate this patient's EUS potentially for the next 1 to 2 weeks. Thanks. GM

## 2021-12-08 ENCOUNTER — Ambulatory Visit (INDEPENDENT_AMBULATORY_CARE_PROVIDER_SITE_OTHER): Payer: No Typology Code available for payment source | Admitting: Pulmonary Disease

## 2021-12-08 ENCOUNTER — Telehealth: Payer: Self-pay | Admitting: Physician Assistant

## 2021-12-08 ENCOUNTER — Encounter: Payer: Self-pay | Admitting: Pulmonary Disease

## 2021-12-08 ENCOUNTER — Telehealth: Payer: Self-pay | Admitting: Emergency Medicine

## 2021-12-08 ENCOUNTER — Encounter: Payer: Self-pay | Admitting: Emergency Medicine

## 2021-12-08 VITALS — BP 120/82 | HR 67 | Temp 97.9°F | Ht 72.0 in | Wt 345.4 lb

## 2021-12-08 DIAGNOSIS — C155 Malignant neoplasm of lower third of esophagus: Secondary | ICD-10-CM

## 2021-12-08 DIAGNOSIS — R590 Localized enlarged lymph nodes: Secondary | ICD-10-CM | POA: Diagnosis not present

## 2021-12-08 NOTE — Telephone Encounter (Signed)
Chong Sicilian,    We received the [place] Auth# [id number] Valid 12/07/2021 to 06/05/2022  Thanks!

## 2021-12-08 NOTE — Telephone Encounter (Signed)
Thank you :)

## 2021-12-08 NOTE — Telephone Encounter (Signed)
Scheduled appt per 5/30 referral . Pt is aware of appt date and time. Pt is aware to arrive 15 mins prior to appt time and to bring and updated insurance card. Pt is aware of appt location.   

## 2021-12-08 NOTE — Telephone Encounter (Signed)
FYI see authorization please advise

## 2021-12-08 NOTE — Telephone Encounter (Signed)
FYI - Pt is scheduled on 6/7 at [facility].  Dr Valeta Harms requested [facility] on order for 6/7 or 6/8 but they did not have any availability.  Dr Valeta Harms suggested I try [facility] and you would be able to drive over.  Therefore I did get 6/7 at 9:00.

## 2021-12-08 NOTE — Patient Instructions (Signed)
Thank you for visiting Dr. Valeta Harms at [facility]. Today we recommend the following: Orders Placed This Encounter  Procedures   Procedural/ Surgical Case Request: ENDOBRONCHIAL ULTRASOUND   Ambulatory referral to Pulmonology   Bronchoscopy on 12/14/2021  Return in about 13 days (around 12/21/2021) for w/ Dr. Lamonte Sakai or Eric Form, NP .    Please do your part to reduce the spread of COVID-19.

## 2021-12-08 NOTE — H&P (View-Only) (Signed)
Synopsis: Referred in June 2023 for esophageal cancer by [facility]  Subjective:   PATIENT ID: Keith Moreno GENDER: male DOB: 12-21-1960, MRN: [id number]  Chief Complaint  Patient presents with   Consult    Patient is here to talk about lymph nodes in the chest.     This is a 61 year old gentleman, past medical history of hypertension, diabetes.Patient underwent EGD and biopsy in May 2023. 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  Patient has subsequently established care with oncology.  Patient had a nuclear medicine PET scan on 11/30/2021 and was referred to pulmonary for consideration of mediastinal node sampling and biopsy.  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  However patient was referred for tissue biopsy.   Past Medical History:  Diagnosis Date   Hypertension      Family History  Problem Relation Age of Onset   Hypertension Mother    Diabetes Mother    Bipolar disorder Mother    Hypertension Father      Past Surgical History:  Procedure Laterality Date   BIOPSY  11/13/2021   Procedure: BIOPSY;  Surgeon: Jerene Bears, MD;  Location: [facility] ENDOSCOPY;  Service: Gastroenterology;;   ESOPHAGOGASTRODUODENOSCOPY N/A 11/13/2021   Procedure: ESOPHAGOGASTRODUODENOSCOPY (EGD);  Surgeon: Jerene Bears, MD;  Location: [facility] ENDOSCOPY;  Service: Gastroenterology;  Laterality: N/A;   FOREIGN BODY REMOVAL  11/13/2021   Procedure: FOREIGN BODY REMOVAL;  Surgeon: Jerene Bears, MD;  Location: [facility] ENDOSCOPY;  Service: Gastroenterology;;    Social History   Socioeconomic History   Marital status: Single    Spouse name: Not on file   Number of children: Not on file   Years of education: Not on file   Highest education level: Not on file  Occupational History   Not on  file  Tobacco Use   Smoking status: Former    Types: Cigarettes    Quit date: 26    Years since quitting: 43.4   Smokeless tobacco: Never  Vaping Use   Vaping Use: Never used  Substance and Sexual Activity   Alcohol use: Yes    Comment: Occasional   Drug use: No   Sexual activity: Not on file  Other Topics Concern   Not on file  Social History Narrative   Not on file   Social Determinants of Health   Financial Resource Strain: Not on file  Food Insecurity: Not on file  Transportation Needs: Not on file  Physical Activity: Not on file  Stress: Not on file  Social Connections: Not on file  Intimate Partner Violence: Not on file     Allergies  Allergen Reactions   Procaine     Other reaction(s): Other, Respiratory Distress   Chocolate     Hyperactivity, respiratory distress   Other     Novocaine- respiratory distress     Outpatient Medications Prior to Visit  Medication Sig Dispense Refill   acetaminophen (TYLENOL) 500 MG tablet Take 1 tablet by mouth at bedtime.     Alogliptin Benzoate 25 MG TABS Take 0.5 tablets by mouth daily.     amLODipine (NORVASC) 10 MG tablet Take 10 mg by mouth daily.     amLODipine (NORVASC) 10 MG tablet Take 1 tablet by mouth at bedtime.     atenolol (TENORMIN) 25 MG tablet TAKE ONE-HALF TABLET BY MOUTH DAILY  FOR BLOOD PRESSURE     Cholecalciferol (VITAMIN D3) 10 MCG (400 UNIT) tablet Take by mouth.     ibuprofen (ADVIL) 400 MG tablet Take 1 tablet by mouth at bedtime.     lidocaine (LIDODERM) 5 % APPLY 1 PATCH TO SKIN ONCE DAILY BACK PAIN (APPLY FOR 12 HOURS, THEN REMOVE FOR 12 HOURS)     lisinopril (PRINIVIL,ZESTRIL) 40 MG tablet Take 40 mg by mouth daily.     lisinopril (ZESTRIL) 40 MG tablet Take by mouth.     Melatonin 10 MG TABS Take by mouth.     olopatadine (PATANOL) 0.1 % ophthalmic solution INSTILL 1 DROP IN BOTH EYES TWICE A DAY FOR ALLERGIES     Omega-3 Fatty Acids (ODORLESS COATED FISH OIL) 1000 MG CPDR Take 1 capsule by  mouth at bedtime.     pioglitazone (ACTOS) 30 MG tablet TAKE ONE TABLET BY MOUTH EVERY MORNING FOR DIABETES     potassium chloride (MICRO-K) 10 MEQ CR capsule Take 10 mEq by mouth 2 (two) times daily.     terbinafine (LAMISIL) 250 MG tablet TAKE ONE TABLET BY MOUTH DAILY FOR FUNGAL INFECTION     No facility-administered medications prior to visit.    Review of Systems  Constitutional:  Negative for chills, fever, malaise/fatigue and weight loss.  HENT:  Negative for hearing loss, sore throat and tinnitus.   Eyes:  Negative for blurred vision and double vision.  Respiratory:  Positive for shortness of breath. Negative for cough, hemoptysis, sputum production, wheezing and stridor.   Cardiovascular:  Negative for chest pain, palpitations, orthopnea, leg swelling and PND.  Gastrointestinal:  Negative for abdominal pain, constipation, diarrhea, heartburn, nausea and vomiting.  Genitourinary:  Negative for dysuria, hematuria and urgency.  Musculoskeletal:  Negative for joint pain and myalgias.  Skin:  Negative for itching and rash.  Neurological:  Negative for dizziness, tingling, weakness and headaches.  Endo/Heme/Allergies:  Negative for environmental allergies. Does not bruise/bleed easily.  Psychiatric/Behavioral:  Negative for depression. The patient is not nervous/anxious and does not have insomnia.   All other systems reviewed and are negative.   Objective:  Physical Exam Vitals reviewed.  Constitutional:      General: He is not in acute distress.    Appearance: He is well-developed.  HENT:     Head: Normocephalic and atraumatic.  Eyes:     General: No scleral icterus.    Conjunctiva/sclera: Conjunctivae normal.     Pupils: Pupils are equal, round, and reactive to light.  Neck:     Vascular: No JVD.     Trachea: No tracheal deviation.  Cardiovascular:     Rate and Rhythm: Normal rate and regular rhythm.     Heart sounds: Normal heart sounds. No murmur heard. Pulmonary:      Effort: Pulmonary effort is normal. No tachypnea, accessory muscle usage or respiratory distress.     Breath sounds: No stridor. No wheezing, rhonchi or rales.  Abdominal:     General: There is no distension.     Palpations: Abdomen is soft.     Tenderness: There is no abdominal tenderness.  Musculoskeletal:        General: No tenderness.     Cervical back: Neck supple.  Lymphadenopathy:     Cervical: No cervical adenopathy.  Skin:    General: Skin is warm and dry.     Capillary Refill: Capillary refill takes less than 2 seconds.     Findings: No rash.  Neurological:  Mental Status: He is alert and oriented to person, place, and time.  Psychiatric:        Behavior: Behavior normal.     Vitals:   12/08/21 1044  BP: 120/82  Pulse: 67  Temp: 97.9 F (36.6 C)  TempSrc: Oral  SpO2: 96%  Weight: (!) 345 lb 6.4 oz (156.7 kg)  Height: 6' (1.829 m)   96% on RA BMI Readings from Last 3 Encounters:  12/08/21 46.84 kg/m  12/06/21 46.79 kg/m  11/29/21 47.47 kg/m   Wt Readings from Last 3 Encounters:  12/08/21 (!) 345 lb 6.4 oz (156.7 kg)  12/06/21 (!) 345 lb (156.5 kg)  11/29/21 (!) 350 lb (158.8 kg)     CBC    Component Value Date/Time   WBC 6.0 11/13/2021 2115   RBC 4.22 11/13/2021 2115   HGB 12.6 (L) 11/13/2021 2122   HCT 37.0 (L) 11/13/2021 2122   PLT 198 11/13/2021 2115   MCV 88.2 11/13/2021 2115   MCH 29.6 11/13/2021 2115   MCHC 33.6 11/13/2021 2115   RDW 13.6 11/13/2021 2115   LYMPHSABS 0.5 (L) 01/08/2015 1523   MONOABS 0.7 01/08/2015 1523   EOSABS 0.1 01/08/2015 1523   BASOSABS 0.0 01/08/2015 1523    Chest Imaging: Nuclear medicine pet imaging May 2023: Diffuse mediastinal adenopathy with calcifications concerning for sarcoidosis less likely metastatic disease. The patient's images have been independently reviewed by me.    Pulmonary Functions Testing Results:     View : No data to display.          FeNO:   Pathology:    Echocardiogram:   Heart Catheterization:     Assessment & Plan:     ICD-10-CM   1. 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 Mediastinal adenopathy  R59.0       Discussion:  This is a 61 year old gentleman recent diagnosis of esophageal cancer.  PET scan with mediastinal adenopathy abnormal hypermetabolic uptake within the nose also showing calcification.  More concerning to me for potential inflammatory etiology or sarcoidosis versus metastatic esophageal cancer.  Plan: Today in the office we talked about the risk benefits and alternatives proceeding with bronchoscopy and endobronchial ultrasound transbronchial needle aspiration. Patient is agreeable to proceed. He is not on any blood thinners or antiplatelets. I am out of the office next week and we will get him set up with one of my partners Dr. Lamonte Sakai. Tentative bronchoscopy date will be on 12/14/2021.    Current Outpatient Medications:    acetaminophen (TYLENOL) 500 MG tablet, Take 1 tablet by mouth at bedtime., Disp: , Rfl:    Alogliptin Benzoate 25 MG TABS, Take 0.5 tablets by mouth daily., Disp: , Rfl:    amLODipine (NORVASC) 10 MG tablet, Take 10 mg by mouth daily., Disp: , Rfl:    amLODipine (NORVASC) 10 MG tablet, Take 1 tablet by mouth at bedtime., Disp: , Rfl:    atenolol (TENORMIN) 25 MG tablet, TAKE ONE-HALF TABLET BY MOUTH DAILY FOR BLOOD PRESSURE, Disp: , Rfl:    Cholecalciferol (VITAMIN D3) 10 MCG (400 UNIT) tablet, Take by mouth., Disp: , Rfl:    ibuprofen (ADVIL) 400 MG tablet, Take 1 tablet by mouth at bedtime., Disp: , Rfl:    lidocaine (LIDODERM) 5 %, APPLY 1 PATCH TO SKIN ONCE DAILY BACK PAIN (APPLY FOR 12 HOURS, THEN REMOVE FOR 12 HOURS), Disp: , Rfl:    lisinopril (PRINIVIL,ZESTRIL) 40 MG tablet, Take 40 mg by  mouth daily., Disp: , Rfl:    lisinopril (ZESTRIL) 40 MG tablet, Take by mouth., Disp: , Rfl:    Melatonin 10 MG TABS, Take by mouth., Disp: , Rfl:    olopatadine  (PATANOL) 0.1 % ophthalmic solution, INSTILL 1 DROP IN BOTH EYES TWICE A DAY FOR ALLERGIES, Disp: , Rfl:    Omega-3 Fatty Acids (ODORLESS COATED FISH OIL) 1000 MG CPDR, Take 1 capsule by mouth at bedtime., Disp: , Rfl:    pioglitazone (ACTOS) 30 MG tablet, TAKE ONE TABLET BY MOUTH EVERY MORNING FOR DIABETES, Disp: , Rfl:    potassium chloride (MICRO-K) 10 MEQ CR capsule, Take 10 mEq by mouth 2 (two) times daily., Disp: , Rfl:    terbinafine (LAMISIL) 250 MG tablet, TAKE ONE TABLET BY MOUTH DAILY FOR FUNGAL INFECTION, Disp: , Rfl:   I spent 63 minutes dedicated to the care of this patient on the date of this encounter to include pre-visit review of records, face-to-face time with the patient discussing conditions above, post visit ordering of testing, clinical documentation with the electronic health record, making appropriate referrals as documented, and communicating necessary findings to members of the patients care team.   Garner Nash, DO [facility] 12/08/2021 11:07 AM

## 2021-12-08 NOTE — Progress Notes (Signed)
Synopsis: Referred in June 2023 for esophageal cancer by [facility]  Subjective:   PATIENT ID: Keith Moreno GENDER: male DOB: 1961/04/12, MRN: [id number]  Chief Complaint  Patient presents with   Consult    Patient is here to talk about lymph nodes in the chest.     This is a 61 year old gentleman, past medical history of hypertension, diabetes.Patient underwent EGD and biopsy in May 2023. 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  Patient has subsequently established care with oncology.  Patient had a nuclear medicine PET scan on 11/30/2021 and was referred to pulmonary for consideration of mediastinal node sampling and biopsy.  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  However patient was referred for tissue biopsy.   Past Medical History:  Diagnosis Date   Hypertension      Family History  Problem Relation Age of Onset   Hypertension Mother    Diabetes Mother    Bipolar disorder Mother    Hypertension Father      Past Surgical History:  Procedure Laterality Date   BIOPSY  11/13/2021   Procedure: BIOPSY;  Surgeon: Jerene Bears, MD;  Location: [facility] ENDOSCOPY;  Service: Gastroenterology;;   ESOPHAGOGASTRODUODENOSCOPY N/A 11/13/2021   Procedure: ESOPHAGOGASTRODUODENOSCOPY (EGD);  Surgeon: Jerene Bears, MD;  [facility] ENDOSCOPY;  Service: Gastroenterology;  Laterality: N/A;   FOREIGN BODY REMOVAL  11/13/2021   Procedure: FOREIGN BODY REMOVAL;  Surgeon: Jerene Bears, MD;  Location: [facility] ENDOSCOPY;  Service: Gastroenterology;;    Social History   Socioeconomic History   Marital status: Single    Spouse name: Not on file   Number of children: Not on file   Years of education: Not on file   Highest education level: Not on file  Occupational History   Not on  file  Tobacco Use   Smoking status: Former    Types: Cigarettes    Quit date: 57    Years since quitting: 43.4   Smokeless tobacco: Never  Vaping Use   Vaping Use: Never used  Substance and Sexual Activity   Alcohol use: Yes    Comment: Occasional   Drug use: No   Sexual activity: Not on file  Other Topics Concern   Not on file  Social History Narrative   Not on file   Social Determinants of Health   Financial Resource Strain: Not on file  Food Insecurity: Not on file  Transportation Needs: Not on file  Physical Activity: Not on file  Stress: Not on file  Social Connections: Not on file  Intimate Partner Violence: Not on file     Allergies  Allergen Reactions   Procaine     Other reaction(s): Other, Respiratory Distress   Chocolate     Hyperactivity, respiratory distress   Other     Novocaine- respiratory distress     Outpatient Medications Prior to Visit  Medication Sig Dispense Refill   acetaminophen (TYLENOL) 500 MG tablet Take 1 tablet by mouth at bedtime.     Alogliptin Benzoate 25 MG TABS Take 0.5 tablets by mouth daily.     amLODipine (NORVASC) 10 MG tablet Take 10 mg by mouth daily.     amLODipine (NORVASC) 10 MG tablet Take 1 tablet by mouth at bedtime.     atenolol (TENORMIN) 25 MG tablet TAKE ONE-HALF TABLET BY MOUTH DAILY  FOR BLOOD PRESSURE     Cholecalciferol (VITAMIN D3) 10 MCG (400 UNIT) tablet Take by mouth.     ibuprofen (ADVIL) 400 MG tablet Take 1 tablet by mouth at bedtime.     lidocaine (LIDODERM) 5 % APPLY 1 PATCH TO SKIN ONCE DAILY BACK PAIN (APPLY FOR 12 HOURS, THEN REMOVE FOR 12 HOURS)     lisinopril (PRINIVIL,ZESTRIL) 40 MG tablet Take 40 mg by mouth daily.     lisinopril (ZESTRIL) 40 MG tablet Take by mouth.     Melatonin 10 MG TABS Take by mouth.     olopatadine (PATANOL) 0.1 % ophthalmic solution INSTILL 1 DROP IN BOTH EYES TWICE A DAY FOR ALLERGIES     Omega-3 Fatty Acids (ODORLESS COATED FISH OIL) 1000 MG CPDR Take 1 capsule by  mouth at bedtime.     pioglitazone (ACTOS) 30 MG tablet TAKE ONE TABLET BY MOUTH EVERY MORNING FOR DIABETES     potassium chloride (MICRO-K) 10 MEQ CR capsule Take 10 mEq by mouth 2 (two) times daily.     terbinafine (LAMISIL) 250 MG tablet TAKE ONE TABLET BY MOUTH DAILY FOR FUNGAL INFECTION     No facility-administered medications prior to visit.    Review of Systems  Constitutional:  Negative for chills, fever, malaise/fatigue and weight loss.  HENT:  Negative for hearing loss, sore throat and tinnitus.   Eyes:  Negative for blurred vision and double vision.  Respiratory:  Positive for shortness of breath. Negative for cough, hemoptysis, sputum production, wheezing and stridor.   Cardiovascular:  Negative for chest pain, palpitations, orthopnea, leg swelling and PND.  Gastrointestinal:  Negative for abdominal pain, constipation, diarrhea, heartburn, nausea and vomiting.  Genitourinary:  Negative for dysuria, hematuria and urgency.  Musculoskeletal:  Negative for joint pain and myalgias.  Skin:  Negative for itching and rash.  Neurological:  Negative for dizziness, tingling, weakness and headaches.  Endo/Heme/Allergies:  Negative for environmental allergies. Does not bruise/bleed easily.  Psychiatric/Behavioral:  Negative for depression. The patient is not nervous/anxious and does not have insomnia.   All other systems reviewed and are negative.   Objective:  Physical Exam Vitals reviewed.  Constitutional:      General: He is not in acute distress.    Appearance: He is well-developed.  HENT:     Head: Normocephalic and atraumatic.  Eyes:     General: No scleral icterus.    Conjunctiva/sclera: Conjunctivae normal.     Pupils: Pupils are equal, round, and reactive to light.  Neck:     Vascular: No JVD.     Trachea: No tracheal deviation.  Cardiovascular:     Rate and Rhythm: Normal rate and regular rhythm.     Heart sounds: Normal heart sounds. No murmur heard. Pulmonary:      Effort: Pulmonary effort is normal. No tachypnea, accessory muscle usage or respiratory distress.     Breath sounds: No stridor. No wheezing, rhonchi or rales.  Abdominal:     General: There is no distension.     Palpations: Abdomen is soft.     Tenderness: There is no abdominal tenderness.  Musculoskeletal:        General: No tenderness.     Cervical back: Neck supple.  Lymphadenopathy:     Cervical: No cervical adenopathy.  Skin:    General: Skin is warm and dry.     Capillary Refill: Capillary refill takes less than 2 seconds.     Findings: No rash.  Neurological:  Mental Status: He is alert and oriented to person, place, and time.  Psychiatric:        Behavior: Behavior normal.     Vitals:   12/08/21 1044  BP: 120/82  Pulse: 67  Temp: 97.9 F (36.6 C)  TempSrc: Oral  SpO2: 96%  Weight: (!) 345 lb 6.4 oz (156.7 kg)  Height: 6' (1.829 m)   96% on RA BMI Readings from Last 3 Encounters:  12/08/21 46.84 kg/m  12/06/21 46.79 kg/m  11/29/21 47.47 kg/m   Wt Readings from Last 3 Encounters:  12/08/21 (!) 345 lb 6.4 oz (156.7 kg)  12/06/21 (!) 345 lb (156.5 kg)  11/29/21 (!) 350 lb (158.8 kg)     CBC    Component Value Date/Time   WBC 6.0 11/13/2021 2115   RBC 4.22 11/13/2021 2115   HGB 12.6 (L) 11/13/2021 2122   HCT 37.0 (L) 11/13/2021 2122   PLT 198 11/13/2021 2115   MCV 88.2 11/13/2021 2115   MCH 29.6 11/13/2021 2115   MCHC 33.6 11/13/2021 2115   RDW 13.6 11/13/2021 2115   LYMPHSABS 0.5 (L) 01/08/2015 1523   MONOABS 0.7 01/08/2015 1523   EOSABS 0.1 01/08/2015 1523   BASOSABS 0.0 01/08/2015 1523    Chest Imaging: Nuclear medicine pet imaging May 2023: Diffuse mediastinal adenopathy with calcifications concerning for sarcoidosis less likely metastatic disease. The patient's images have been independently reviewed by me.    Pulmonary Functions Testing Results:     View : No data to display.          FeNO:   Pathology:    Echocardiogram:   Heart Catheterization:     Assessment & Plan:     ICD-10-CM   1. 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 Mediastinal adenopathy  R59.0       Discussion:  This is a 61 year old gentleman recent diagnosis of esophageal cancer.  PET scan with mediastinal adenopathy abnormal hypermetabolic uptake within the nose also showing calcification.  More concerning to me for potential inflammatory etiology or sarcoidosis versus metastatic esophageal cancer.  Plan: Today in the office we talked about the risk benefits and alternatives proceeding with bronchoscopy and endobronchial ultrasound transbronchial needle aspiration. Patient is agreeable to proceed. He is not on any blood thinners or antiplatelets. I am out of the office next week and we will get him set up with one of my partners Dr. Lamonte Sakai. Tentative bronchoscopy date will be on 12/14/2021.    Current Outpatient Medications:    acetaminophen (TYLENOL) 500 MG tablet, Take 1 tablet by mouth at bedtime., Disp: , Rfl:    Alogliptin Benzoate 25 MG TABS, Take 0.5 tablets by mouth daily., Disp: , Rfl:    amLODipine (NORVASC) 10 MG tablet, Take 10 mg by mouth daily., Disp: , Rfl:    amLODipine (NORVASC) 10 MG tablet, Take 1 tablet by mouth at bedtime., Disp: , Rfl:    atenolol (TENORMIN) 25 MG tablet, TAKE ONE-HALF TABLET BY MOUTH DAILY FOR BLOOD PRESSURE, Disp: , Rfl:    Cholecalciferol (VITAMIN D3) 10 MCG (400 UNIT) tablet, Take by mouth., Disp: , Rfl:    ibuprofen (ADVIL) 400 MG tablet, Take 1 tablet by mouth at bedtime., Disp: , Rfl:    lidocaine (LIDODERM) 5 %, APPLY 1 PATCH TO SKIN ONCE DAILY BACK PAIN (APPLY FOR 12 HOURS, THEN REMOVE FOR 12 HOURS), Disp: , Rfl:    lisinopril (PRINIVIL,ZESTRIL) 40 MG tablet, Take 40 mg by  mouth daily., Disp: , Rfl:    lisinopril (ZESTRIL) 40 MG tablet, Take by mouth., Disp: , Rfl:    Melatonin 10 MG TABS, Take by mouth., Disp: , Rfl:    olopatadine  (PATANOL) 0.1 % ophthalmic solution, INSTILL 1 DROP IN BOTH EYES TWICE A DAY FOR ALLERGIES, Disp: , Rfl:    Omega-3 Fatty Acids (ODORLESS COATED FISH OIL) 1000 MG CPDR, Take 1 capsule by mouth at bedtime., Disp: , Rfl:    pioglitazone (ACTOS) 30 MG tablet, TAKE ONE TABLET BY MOUTH EVERY MORNING FOR DIABETES, Disp: , Rfl:    potassium chloride (MICRO-K) 10 MEQ CR capsule, Take 10 mEq by mouth 2 (two) times daily., Disp: , Rfl:    terbinafine (LAMISIL) 250 MG tablet, TAKE ONE TABLET BY MOUTH DAILY FOR FUNGAL INFECTION, Disp: , Rfl:   I spent 63 minutes dedicated to the care of this patient on the date of this encounter to include pre-visit review of records, face-to-face time with the patient discussing conditions above, post visit ordering of testing, clinical documentation with the electronic health record, making appropriate referrals as documented, and communicating necessary findings to members of the patients care team.   Garner Nash, DO [facility] 12/08/2021 11:07 AM

## 2021-12-09 ENCOUNTER — Other Ambulatory Visit: Payer: Self-pay

## 2021-12-09 DIAGNOSIS — C155 Malignant neoplasm of lower third of esophagus: Secondary | ICD-10-CM

## 2021-12-09 NOTE — Telephone Encounter (Signed)
Perfect. Thanks. GM

## 2021-12-09 NOTE — Telephone Encounter (Signed)
Thanks - sounds like they have it scheduled to follow the EBUS. Works for me [employer]

## 2021-12-09 NOTE — Telephone Encounter (Signed)
The pt has been scheduled and  instructed for his appts 12/14/21.  All orders have been entered for EUS.  I did speak with [facility] endo and nothing further is needed from our office.

## 2021-12-10 ENCOUNTER — Encounter: Payer: Self-pay | Admitting: Gastroenterology

## 2021-12-10 NOTE — Progress Notes (Signed)
Review of outside referral  Community care referral for esophageal adenocarcinoma and need for EUS for staging  [facility] did not feel [facility] can do any further procedures and so they placed a community care referral  Patient is scheduled for combined EBUS/EUS next week on 6/7 where we will try to obtain further lymphadenopathy tissue to discern whether patient has metastatic esophageal cancer versus sarcoidosis versus both versus neither.  We will also go ahead and stage his esophageal cancer at that time.   Justice Britain, MD [facility] Advanced [facility]oscopy Office # [phone]

## 2021-12-11 NOTE — Progress Notes (Unsigned)
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 Underwent EGD with Dr. Hilarie Fredrickson. Findings revealed medium sized fungating mass in the lower third of th esophagus, 35 cm from the incisors and extending into the GE junction.   It appears to have arisen in the background of Barrett's esophagus, the mass was nonobstructing and partially circumferential. Pathology confirmed moderately to poorly differentiated adenocarcinoma.  11/30/2021: PET scan showed  hypermetabolic activity in the distal esophagus with an SUV of 7.7 with thickening measuring up to 15 mm and spanning just to above the GE junction.  Diffuse mediastinal adenopathy showed dense calcification in all nearly symmetric findings.  12/06/2021: Established care with Radiation Oncology with Dr. Kyung Rudd and Shona Simpson PA-C 12/12/2021: Establish care with Medication Oncology with Dr. Narda Rutherford and Dede Query PA-C  HISTORY OF PRESENTING ILLNESS:  Keith Moreno 61 y.o. male with medical history significant for hypertension and prediabetes.  He is accompanied by his wife for this visit.   On exam today, Keith Moreno reports that his energy levels are fairly stable.  He continues to complete all his daily activities on his own.  Since presenting to the emergency room last month, he has changed his diet to more of a liquid/pured diet.  He experiences dysphagia and occasional regurgitation with solid foods, primarily meats.  He denies any nausea, vomiting or abdominal pain.  His bowel habits have slowed down some since switching to a liquid, pured diet.  He denies easy bruising or signs of active bleeding.   He reports having a dry cough that is secondary to taking lisinopril.  He denies fevers, chills, night sweats, shortness of breath, chest pain.  He has no other complaints.  Rest of the 10 point ROS is below  MEDICAL HISTORY:  Past Medical History:  Diagnosis Date   Diabetes mellitus without complication ([place])    Hypertension     SURGICAL HISTORY: Past Surgical History:  Procedure Laterality Date   BIOPSY  11/13/2021   Procedure: BIOPSY;  Surgeon: Jerene Bears, MD;  Location: [facility] ENDOSCOPY;  Service: Gastroenterology;;   ESOPHAGOGASTRODUODENOSCOPY N/A 11/13/2021   Procedure: ESOPHAGOGASTRODUODENOSCOPY (EGD);  Surgeon: Jerene Bears, MD;  Location: [facility] ENDOSCOPY;  Service: Gastroenterology;  Laterality: N/A;   FOREIGN BODY REMOVAL  11/13/2021   Procedure: FOREIGN BODY REMOVAL;  Surgeon: Jerene Bears, MD;  Location: [facility] ENDOSCOPY;  Service: Gastroenterology;;   HUMERUS FRACTURE SURGERY Right    TONSILLECTOMY AND ADENOIDECTOMY      SOCIAL HISTORY: Social History   Socioeconomic History   Marital status: Single    Spouse name: Not on file   Number of children: Not on file   Years of education: Not on file   Highest education level: Not on file  Occupational History   Not on file  Tobacco Use   Smoking status: Former    Types: Cigars, Cigarettes, Pipe    Quit date: 40    Years since quitting: 40.4   Smokeless tobacco: Former    Types: Chew    Quit date: 1983  Vaping Use   Vaping Use: Never used  Substance and Sexual Activity   Alcohol use: Yes    Comment: Occasional   Drug use: No  Sexual activity: Not on file  Other Topics Concern   Not on file  Social History Narrative   Not on file   Social Determinants of Health   Financial Resource Strain: Not on file  Food Insecurity: Not on file  Transportation Needs: Not on file  Physical Activity: Not on file  Stress: Not on file  Social Connections: Not on file  Intimate Partner Violence: Not on file    FAMILY  HISTORY: Family History  Problem Relation Age of Onset   Hypertension Mother    Diabetes Mother    Bipolar disorder Mother    Hypertension Father    Kidney cancer Sister    Colon cancer Paternal Uncle    Cancer Cousin    Bladder Cancer Cousin    Cancer Cousin     ALLERGIES:  is allergic to procaine, chocolate, and other.  MEDICATIONS:  Current Outpatient Medications  Medication Sig Dispense Refill   Alogliptin Benzoate 25 MG TABS Take 12.5 mg by mouth at bedtime.     amLODipine (NORVASC) 10 MG tablet Take 1 tablet by mouth at bedtime.     Ascorbic Acid (VITAMIN C) 1000 MG tablet Take 2,000 mg by mouth every morning.     atenolol (TENORMIN) 25 MG tablet Take 12.5 mg by mouth at bedtime.     Cholecalciferol (VITAMIN D3) 50 MCG (2000 UT) capsule Take 20,000 Units by mouth at bedtime. 10 gel caps     CINNAMON PO Take 4,000 mg by mouth 2 (two) times daily. 1000 mg each     diphenhydramine-acetaminophen (TYLENOL PM) 25-500 MG TABS tablet Take 2 tablets by mouth at bedtime.     ibuprofen (ADVIL) 200 MG tablet Take 400 mg by mouth at bedtime.     Krill Oil 350 MG CAPS Take 1,050 mg by mouth at bedtime.     lisinopril (PRINIVIL,ZESTRIL) 40 MG tablet Take 40 mg by mouth at bedtime.     MAGNESIUM CITRATE PO Take 100 mg by mouth at bedtime.     Melatonin 10 MG TABS Take 40 mg by mouth at bedtime as needed (Sleep).     olopatadine (PATANOL) 0.1 % ophthalmic solution Place 1 drop into both eyes 2 (two) times daily.     pioglitazone (ACTOS) 30 MG tablet Take 30 mg by mouth every morning.     Potassium 99 MG TABS Take 198 mg by mouth daily.     terbinafine (LAMISIL) 250 MG tablet Take 250 mg by mouth every morning.     vitamin E 180 MG (400 UNITS) capsule Take 1,200 Units by mouth 2 (two) times daily. Gel caps     No current facility-administered medications for this visit.    REVIEW OF SYSTEMS:   Constitutional: ( - ) fevers, ( - )  chills , ( - ) night sweats Eyes: ( - ) blurriness of  vision, ( - ) double vision, ( - ) watery eyes Ears, nose, mouth, throat, and face: ( - ) mucositis, ( - ) sore throat Respiratory: ( + ) cough, ( - ) dyspnea, ( - ) wheezes Cardiovascular: ( - ) palpitation, ( - ) chest discomfort, ( - ) lower extremity swelling Gastrointestinal:  ( - ) nausea, ( - ) heartburn, ( - ) change in bowel habits Skin: ( - ) abnormal skin rashes Lymphatics: ( - ) new lymphadenopathy, ( - ) easy bruising Neurological: ( - ) numbness, ( - ) tingling, ( - ) new weaknesses Behavioral/Psych: ( - )  mood change, ( - ) new changes  All other systems were reviewed with the patient and are negative.  PHYSICAL EXAMINATION: ECOG PERFORMANCE STATUS: 1 - Symptomatic but completely ambulatory  Vitals:   12/12/21 0910  BP: (!) 154/88  Pulse: 62  Resp: 17  Temp: (!) 97.3 F (36.3 C)  SpO2: 97%   Filed Weights   12/12/21 0910  Weight: (!) 347 lb 1 oz (157.4 kg)    GENERAL: well appearing male in NAD  SKIN: skin color, texture, turgor are normal, no rashes or significant lesions EYES: conjunctiva are pink and non-injected, sclera clear OROPHARYNX: no exudate, no erythema; lips, buccal mucosa, and tongue normal  NECK: supple, non-tender LYMPH:  no palpable lymphadenopathy in the cervical or supraclavicular lymph nodes.  LUNGS: clear to auscultation and percussion with normal breathing effort HEART: regular rate & rhythm and no murmurs and no lower extremity edema ABDOMEN: soft, non-tender, non-distended, normal bowel sounds Musculoskeletal: no cyanosis of digits and no clubbing  PSYCH: alert & oriented x 3, fluent speech NEURO: no focal motor/sensory deficits  LABORATORY DATA:  I have reviewed the data as listed    Latest Ref Rng & Units 12/12/2021   11:03 AM 11/13/2021    9:22 PM 11/13/2021    9:15 PM  CBC  WBC 4.0 - 10.5 K/uL 4.8    6.0    Hemoglobin 13.0 - 17.0 g/dL 12.5   12.6   12.5    Hematocrit 39.0 - 52.0 % 36.9   37.0   37.2    Platelets 150 - 400 K/uL  217    198         Latest Ref Rng & Units 11/13/2021    9:22 PM 01/08/2015    3:23 PM  CMP  Glucose 70 - 99 mg/dL 103   106    BUN 8 - 23 mg/dL 27   16    Creatinine 0.61 - 1.24 mg/dL 2.00   1.11    Sodium 135 - 145 mmol/L 139   136    Potassium 3.5 - 5.1 mmol/L 3.8   4.4    Chloride 98 - 111 mmol/L 103   100    CO2 22 - 32 mmol/L  26    Calcium 8.9 - 10.3 mg/dL  9.1    Total Protein 6.5 - 8.1 g/dL  7.7    Total Bilirubin 0.3 - 1.2 mg/dL  1.0    Alkaline Phos 38 - 126 U/L  86    AST 15 - 41 U/L  28    ALT 17 - 63 U/L  56       PATHOLOGY: FINAL MICROSCOPIC DIAGNOSIS: A. ESOPHAGUS TUMOR, DISTAL, BIOPSY: - Moderately to poorly differentiated adenocarcinoma.  HER2 Negative by IHC  RADIOGRAPHIC STUDIES: I have personally reviewed the radiological images as listed and agreed with the findings in the report. NM PET Image Initial (PI) Skull Base To Thigh (F-18 FDG)  Result Date: 12/02/2021 CLINICAL DATA:  Initial treatment strategy for esophageal adenocarcinoma in a 61 year old male. EXAM: NUCLEAR MEDICINE PET SKULL BASE TO THIGH TECHNIQUE: 15.5 mCi F-18 FDG was injected intravenously. Full-ring PET imaging was performed from the skull base to thigh after the radiotracer. CT data was obtained and used for attenuation correction and anatomic localization. Fasting blood glucose: 111 mg/dl COMPARISON:  Imaging from January 08, 2015. FINDINGS: Mediastinal blood pool activity: SUV max 3.17 Liver activity: SUV max NA NECK: Scattered small lymph nodes in the LEFT neck at level III  and IV (image 43/4) maximum SUV 3.3 measuring approximately 7-8 mm. No additional areas in the neck of concern. Incidental CT findings: None CHEST: Distal esophageal uptake with a maximum SUV 7.7 (image 90/4) this is in an area of esophageal thickening at the distal esophagus measuring up to 15 mm with some eccentric thickening in this location. Metabolic activity spans a segment of the esophagus just above the GE junction.  Diffuse mediastinal adenopathy many of these lymph nodes show dense calcification all nearly symmetric with respect to metabolic activity and many present on imaged portions of the chest included on the abdominal CT from January 08, 2015, for instance on image 67/4 maximum SUV of the LEFT hilum 8.22 and maximum SUV along RIGHT paratracheal chain of 9.76 also paraesophageal lymph nodes on image 87/4 were present on previous CT imaging and are little changed in terms of size showing intense metabolic activity with a maximum SUV 10.38. Lymph nodes track along the RIGHT greater the LEFT mediastinum and largely spare the mediastinum anteriorly. Is Small nodule in the knee RIGHT upper lobe (image 71/4) no substantial increased metabolic activity but this measures only 7 mm with a maximum SUV of 1.0 Incidental CT findings: Signs of mild aortic atherosclerosis and coronary artery disease mild cardiomegaly. ABDOMEN/PELVIS: 12 mm lymph node adjacent to the RIGHT diaphragmatic crus shows a maximum SUV of 10.28 and shows no visible calcium and was not enlarged on previous imaging. Generalized heterogeneous uptake of FDG throughout the liver. No focal area with suspicious features. Small hiatal hernia with heterogeneous uptake in the stomach below the diaphragm as well maximum SUV 5.89 (image 101/4) Incidental CT findings: Liver without acute process. Gallbladder, pancreas, spleen and adrenal glands are unremarkable. Nephrolithiasis in the interpolar RIGHT kidney with a moderately large 10 mm calculus. No sign of hydronephrosis or perinephric stranding. Scattered small calculi in the LEFT kidney. Small LEFT renal cyst which does not require dedicated imaging follow-up on image 139 measuring approximately 1-1.2 cm. Urinary bladder is collapsed. Gastric and esophageal thickening as discussed. Signs of colonic diverticulosis. Normal appendix. No acute small bowel process. Heterogeneous uptake about the prostate showing a maximum SUV of  approximately 5.7. Moderate RIGHT fat containing inguinal hernia. SKELETON: No focal hypermetabolic activity to suggest skeletal metastasis. Incidental CT findings: none IMPRESSION: Signs of esophageal uptake and thickening in this patient with known esophageal neoplasm likely represents the site of disease. Heterogeneity and potential thickening near the gastric fundus in the setting of small hiatal hernia is nonspecific and potentially physiologic. However, in light of distal esophageal neoplasm would correlate with the recent endoscopic results to ensure no gastric abnormality. Diffuse mediastinal adenopathy, areas imaged on prior studies are similar and many areas with calcification, pattern of adenopathy and uptake would favor process such as sarcoidosis. Unfortunately this confounds assessment for metastatic disease. The most suspicious node currently is a juxta crural lymph node on the RIGHT adjacent to the celiac axis though again this could be related to underlying sarcoidosis. No definitive signs of metastatic disease with above limitations as outlined. Nephrolithiasis. Colonic diverticulosis. Heterogeneous uptake in the prostate is nonspecific. Consider urologic follow-up with PSA as warranted. Electronically Signed   By: Zetta Bills M.D.   On: 12/02/2021 12:57    ASSESSMENT & PLAN Keith Moreno is a 61 y.o. male who presents to the clinic for newly diagnosed distal esophageal adenocarcinoma.  We reviewed the PET CT scan imaging that showed diffuse mediastinal adenopathy with many areas of calcification.  This would  favor process such as sarcoidosis but need to rule out metastatic disease.  Patient will undergo EBUS and EUS to complete staging on 12/14/2021.  Additionally, we are awaiting referral to surgery to determine if patient is a candidate for surgical resection  If patient is found to have localized disease, recommendation would be chemoradiation followed by evaluation for surgery. The  proposed chemotherapy regimen would be carboplatin and paclitaxel.  We reviewed the dose, frequency and side effects with the patient. If patient has metastatic disease, the recommendation would be systemic chemotherapy. We will await for patient to undergoing remaining staging studies this week to finalize treatment plan.  #Distal esophageal adenocarcinoma, HER2 negative, no PD-L1 expression (CPS of 5): --Need EUS and EBUS to complete staging, scheduled for 12/14/2021 --Labs today to check CBC, CMP, CEA, PSA levels.  --Referral to nutrition, patient is currently on liquid/puree diet. Monitor closely for need for PEG --Need port and chemoeducation before starting chemotherapy. Orders placed.  --RTC once workup is complete and ready to initiate chemotherapy.   #Heterogeneous uptake in the prostate --Seen on PET scan from 11/30/2021 --Ordered PSA level today.   Orders Placed This Encounter  Procedures   CBC with Differential ([place] Only)    Standing Status:   Future    Number of Occurrences:   1    Standing Expiration Date:   12/13/2022   CEA (IN HOUSE-[facility])FOR [facility] ONLY    Standing Status:   Future    Number of Occurrences:   1    Standing Expiration Date:   12/13/2022   CMP ([place] only)    Standing Status:   Future    Number of Occurrences:   1    Standing Expiration Date:   12/13/2022   Prostate-Specific AG, Serum    Standing Status:   Future    Number of Occurrences:   1    Standing Expiration Date:   12/13/2022    All questions were answered. The patient knows to call the clinic with any problems, questions or concerns.  I have spent a total of 60 minutes minutes of face-to-face and non-face-to-face time, preparing to see the patient, obtaining and/or reviewing separately obtained history, performing a medically appropriate examination, counseling and educating the patient, ordering tests/procedures, referring and communicating with other health care professionals,  documenting clinical information in the electronic health record,  and care coordination.   Dede Query, PA-C Department of Hematology/Oncology [place] at [facility] Phone: [phone]  Patient was seen with Dr. Lorenso Courier  I have read the above note and personally examined the patient. I agree with the assessment and plan as noted above.  Briefly Keith Moreno 61 year old male with newly diagnosed adenocarcinoma of the esophagus.  Disease currently appears localized however will need to complete staging with an EUS and EBUS to evaluate for enlarged lymph nodes which are thought to be secondary to sarcoidosis.  Once staging is complete, assuming this is localized disease we will plan to proceed with chemoradiation followed by surgical resection.  Today we discussed the diagnosis of adenocarcinoma esophagus, the staging procedure moving forward, and the treatment options.  The patient voices understanding of the plan moving forward.   Ledell Peoples, MD Department of Hematology/Oncology [place] at [facility] Phone: [phone] Pager: [phone] Email: [email or web address]

## 2021-12-12 ENCOUNTER — Encounter: Payer: Self-pay | Admitting: Physician Assistant

## 2021-12-12 ENCOUNTER — Inpatient Hospital Stay: Payer: No Typology Code available for payment source | Attending: Physician Assistant | Admitting: Physician Assistant

## 2021-12-12 ENCOUNTER — Other Ambulatory Visit: Payer: Self-pay | Admitting: Emergency Medicine

## 2021-12-12 ENCOUNTER — Other Ambulatory Visit: Payer: Self-pay

## 2021-12-12 ENCOUNTER — Encounter (HOSPITAL_COMMUNITY): Payer: Self-pay | Admitting: Emergency Medicine

## 2021-12-12 ENCOUNTER — Inpatient Hospital Stay: Payer: No Typology Code available for payment source

## 2021-12-12 VITALS — BP 154/88 | HR 62 | Temp 97.3°F | Resp 17 | Wt 347.1 lb

## 2021-12-12 DIAGNOSIS — Z800 Family history of malignant neoplasm of digestive organs: Secondary | ICD-10-CM | POA: Insufficient documentation

## 2021-12-12 DIAGNOSIS — R590 Localized enlarged lymph nodes: Secondary | ICD-10-CM | POA: Diagnosis not present

## 2021-12-12 DIAGNOSIS — Z8051 Family history of malignant neoplasm of kidney: Secondary | ICD-10-CM

## 2021-12-12 DIAGNOSIS — F17290 Nicotine dependence, other tobacco product, uncomplicated: Secondary | ICD-10-CM | POA: Insufficient documentation

## 2021-12-12 DIAGNOSIS — Z8052 Family history of malignant neoplasm of bladder: Secondary | ICD-10-CM | POA: Insufficient documentation

## 2021-12-12 DIAGNOSIS — Z8616 Personal history of COVID-19: Secondary | ICD-10-CM | POA: Diagnosis not present

## 2021-12-12 DIAGNOSIS — C159 Malignant neoplasm of esophagus, unspecified: Secondary | ICD-10-CM

## 2021-12-12 DIAGNOSIS — I10 Essential (primary) hypertension: Secondary | ICD-10-CM | POA: Insufficient documentation

## 2021-12-12 DIAGNOSIS — Z87442 Personal history of urinary calculi: Secondary | ICD-10-CM | POA: Diagnosis not present

## 2021-12-12 DIAGNOSIS — K2270 Barrett's esophagus without dysplasia: Secondary | ICD-10-CM | POA: Diagnosis present

## 2021-12-12 DIAGNOSIS — K4090 Unilateral inguinal hernia, without obstruction or gangrene, not specified as recurrent: Secondary | ICD-10-CM | POA: Insufficient documentation

## 2021-12-12 DIAGNOSIS — N200 Calculus of kidney: Secondary | ICD-10-CM | POA: Insufficient documentation

## 2021-12-12 DIAGNOSIS — Z833 Family history of diabetes mellitus: Secondary | ICD-10-CM | POA: Diagnosis not present

## 2021-12-12 DIAGNOSIS — K449 Diaphragmatic hernia without obstruction or gangrene: Secondary | ICD-10-CM | POA: Diagnosis not present

## 2021-12-12 DIAGNOSIS — K5900 Constipation, unspecified: Secondary | ICD-10-CM | POA: Insufficient documentation

## 2021-12-12 DIAGNOSIS — Z809 Family history of malignant neoplasm, unspecified: Secondary | ICD-10-CM | POA: Insufficient documentation

## 2021-12-12 DIAGNOSIS — Z818 Family history of other mental and behavioral disorders: Secondary | ICD-10-CM | POA: Diagnosis not present

## 2021-12-12 DIAGNOSIS — R059 Cough, unspecified: Secondary | ICD-10-CM | POA: Diagnosis not present

## 2021-12-12 DIAGNOSIS — K5730 Diverticulosis of large intestine without perforation or abscess without bleeding: Secondary | ICD-10-CM | POA: Diagnosis not present

## 2021-12-12 DIAGNOSIS — Z79899 Other long term (current) drug therapy: Secondary | ICD-10-CM | POA: Diagnosis not present

## 2021-12-12 DIAGNOSIS — C155 Malignant neoplasm of lower third of esophagus: Secondary | ICD-10-CM | POA: Insufficient documentation

## 2021-12-12 DIAGNOSIS — Z8249 Family history of ischemic heart disease and other diseases of the circulatory system: Secondary | ICD-10-CM | POA: Diagnosis not present

## 2021-12-12 LAB — CBC WITH DIFFERENTIAL (CANCER CENTER ONLY)
Abs Immature Granulocytes: 0.06 10*3/uL (ref 0.00–0.07)
Basophils Absolute: 0 10*3/uL (ref 0.0–0.1)
Basophils Relative: 1 %
Eosinophils Absolute: 0.1 10*3/uL (ref 0.0–0.5)
Eosinophils Relative: 2 %
HCT: 36.9 % — ABNORMAL LOW (ref 39.0–52.0)
Hemoglobin: 12.5 g/dL — ABNORMAL LOW (ref 13.0–17.0)
Immature Granulocytes: 1 %
Lymphocytes Relative: 12 %
Lymphs Abs: 0.6 10*3/uL — ABNORMAL LOW (ref 0.7–4.0)
MCH: 29.7 pg (ref 26.0–34.0)
MCHC: 33.9 g/dL (ref 30.0–36.0)
MCV: 87.6 fL (ref 80.0–100.0)
Monocytes Absolute: 0.4 10*3/uL (ref 0.1–1.0)
Monocytes Relative: 8 %
Neutro Abs: 3.6 10*3/uL (ref 1.7–7.7)
Neutrophils Relative %: 76 %
Platelet Count: 217 10*3/uL (ref 150–400)
RBC: 4.21 MIL/uL — ABNORMAL LOW (ref 4.22–5.81)
RDW: 13.7 % (ref 11.5–15.5)
WBC Count: 4.8 10*3/uL (ref 4.0–10.5)
nRBC: 0 % (ref 0.0–0.2)

## 2021-12-12 LAB — CMP (CANCER CENTER ONLY)
ALT: 15 U/L (ref 0–44)
AST: 15 U/L (ref 15–41)
Albumin: 4.7 g/dL (ref 3.5–5.0)
Alkaline Phosphatase: 57 U/L (ref 38–126)
Anion gap: 5 (ref 5–15)
BUN: 24 mg/dL — ABNORMAL HIGH (ref 8–23)
CO2: 29 mmol/L (ref 22–32)
Calcium: 12.6 mg/dL — ABNORMAL HIGH (ref 8.9–10.3)
Chloride: 102 mmol/L (ref 98–111)
Creatinine: 1.26 mg/dL — ABNORMAL HIGH (ref 0.61–1.24)
GFR, Estimated: >60 mL/min (ref >60)
Glucose, Bld: 91 mg/dL (ref 70–99)
Potassium: 4.2 mmol/L (ref 3.5–5.1)
Sodium: 136 mmol/L (ref 135–145)
Total Bilirubin: 0.5 mg/dL (ref 0.3–1.2)
Total Protein: 7.4 g/dL (ref 6.5–8.1)

## 2021-12-12 LAB — CEA (IN HOUSE-CHCC): CEA (CHCC-In House): 29.4 ng/mL — ABNORMAL HIGH (ref 0.00–5.00)

## 2021-12-12 LAB — SARS CORONAVIRUS 2 (TAT 6-24 HRS): SARS Coronavirus 2: NEGATIVE

## 2021-12-12 NOTE — Progress Notes (Signed)
Spoke with pt for pre-op call. Pt denies cardiac history. He is treated for HTN and Pre-diabetes. States his last A1C was 5.7 and he states his fasting blood sugar is usually between 85-95. Instructed him to hold his Jardiance Tuesday and Wednesday, his Pioglitazone to be held Tuesday evening and no Metformin the morning of procedure (Wednesday). Instructed him to check his blood sugar when he wakes up and every 2 hours until he leaves for the hospital. If blood sugar is 70 or below, treat with 1/2 cup of clear juice (apple or cranberry) and recheck blood sugar 15 minutes after drinking juice. If blood sugar continues to be 70 or below, call the Short Stay department and ask to speak to a nurse.

## 2021-12-12 NOTE — Progress Notes (Signed)
I met with Keith Moreno and Ms Jensen after  Keith Macmullen's consultation with Irene Thayil, PA and Dr Dorsey.  I explained my role as a nurse navigator and provided my contact information.  I explained the services provided at [facility] and provided written information.  I explained the alight grant and let  them know one of the financial advisors will reach out to  him at the time of his chemo education class.  I briefly explained insertion and care of a port a cath.  I showed a sample of the port a cath.  I briefly reviewed common side effects associated with the recommended chemotherapy regimen,  weekly carboplatin and taxoI.  I told them he will be scheduled for chemotherapy education class prior to receiving chemotherapy. I told them his weekly chemotherapy will start when he starts radiation treatments I told them our schedulers will call him with those appts.  All questions were answered. They verbalized understanding.  

## 2021-12-12 NOTE — Anesthesia Preprocedure Evaluation (Signed)
Anesthesia Evaluation  Patient identified by MRN, date of birth, ID band Patient awake    Reviewed: Allergy & Precautions, NPO status , Patient's Chart, lab work & pertinent test results  Airway Mallampati: III  TM Distance: >3 FB Neck ROM: Full    Dental  (+) Missing   Pulmonary former smoker,    Pulmonary exam normal        Cardiovascular hypertension, Pt. on medications and Pt. on home beta blockers Normal cardiovascular exam     Neuro/Psych PSYCHIATRIC DISORDERS Anxiety PTSD (post-traumatic stress disorder)negative neurological ROS     GI/Hepatic negative GI ROS, Neg liver ROS,   Endo/Other  Morbid obesity  Renal/GU negative Renal ROS     Musculoskeletal negative musculoskeletal ROS (+)   Abdominal (+) + obese,   Peds  Hematology  (+) Blood dyscrasia, anemia ,   Anesthesia Other Findings Malignant neoplasm of distal third of esophagus Mediastinal adenopathy   Reproductive/Obstetrics                           Anesthesia Physical Anesthesia Plan  ASA: 3  Anesthesia Plan: General   Post-op Pain Management:    Induction: Intravenous  PONV Risk Score and Plan: 2 and Ondansetron, Dexamethasone, Midazolam and Treatment may vary due to age or medical condition  Airway Management Planned: Oral ETT  Additional Equipment:   Intra-op Plan:   Post-operative Plan: Extubation in OR  Informed Consent: I have reviewed the patients History and Physical, chart, labs and discussed the procedure including the risks, benefits and alternatives for the proposed anesthesia with the patient or authorized representative who has indicated his/her understanding and acceptance.     Dental advisory given  Plan Discussed with: CRNA  Anesthesia Plan Comments: (PAT note written 12/12/2021 by Myra Gianotti, PA-C. )      Anesthesia Quick Evaluation

## 2021-12-12 NOTE — Progress Notes (Signed)
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 He received care through the [facility], but [facility] Referral made given findings of mediastinal adenopathy, finding of "poorly differentiated adenocarcinoma of the esophagus" on 11/13/21 EGD, and patient's BMI. Per GI Dr. Donneta Romberg notes, "Patient is scheduled for combined EBUS/EUS next week on 6/7 where we will try to obtain further lymphadenopathy tissue to discern whether patient has metastatic esophageal cancer versus sarcoidosis versus both versus neither.  We will also go ahead and stage his esophageal cancer at that time."  Other history includes former smoker (quit 07/10/81), HTN, DM2, PTSD, right arm fracture (s/p ORIF 2017). [place] PCP is following elevated calcium (noted in March 2023).   He presented to [facility] ED on 11/13/21 with dysphagia. He had a pending barium swallow study at the [facility], but prior to arrival he had esophageal meat impaction and was unable to swallow liquids or saliva. He underwent emergency EGD by Dr. Hilarie Fredrickson with food removal and finding of fungating mass in the lower third of the esophagus with pathology positive for poorly differentiated adenocarcinoma. His Creatinine then was 2.00, BUN 27, by ISTAT-8 (previously Creatinine ~ 1.31-1.38, BUN 24-31, eGFR 60-62 in March 2023 at the [facility]).  He was evaluated at [facility] on 12/12/21 by Dede Query, PA-C and had  updated CBC with diff, CMP and also a CEA (in process). His Creatinine is down to 1.26 which appears back to his baseline in March. His Calcium remains elevated 12.6 (12.0 corrected for albumin) which I reviewed with anesthesiologist Suella Broad, MD. Previously Ca 11.2, albumin 4.1, & intact PTH 68.0 on 10/06/21 and Ca 11.3 on 09/30/21. March 2023 [facility] primary care note indicate that Dr. Rondell Reams is following hypercalcemia. He has since been diagnosed with esophageal cancer and mediastinal adenopathy and above procedures recommended for staging/tissue diagnosis as mentioned above.  Anesthesia team to evaluate on the day of surgery. EKG on arrival as indicated. Last labs on 12/12/21.     VS:  Wt Readings from Last 3 Encounters:  12/12/21 (!) 157.4 kg  12/08/21 (!) 156.7 kg  12/06/21 (!) 156.5 kg   BP Readings from Last 3 Encounters:  12/12/21 (!) 154/88  12/08/21 120/82  12/06/21 129/74   Pulse Readings from Last 3 Encounters:  12/12/21 62  12/08/21 67  12/06/21 63     PROVIDERS: [facility] Providers: PCP: Herold Harms, MD HEM-ONC: Carlean Jews, MD & Monica Martinez, MD GI: Rolla Etienne, PA-C  [facility] Providers: HEM-ONCNarda Rutherford, MD RAD-ONC: Kyung Rudd, MD  GI: Zenovia Jarred, MD & Justice Britain, MD Fatima SangerJune Leap, DO   LABS:  Lab Results  Component Value Date   WBC 4.8 12/12/2021   HGB 12.5 (L) 12/12/2021   HCT 36.9 (L) 12/12/2021   PLT 217 12/12/2021   GLUCOSE 91 12/12/2021   ALT 15 12/12/2021   AST 15  12/12/2021   NA 136 12/12/2021   K 4.2 12/12/2021   CL 102 12/12/2021   CREATININE 1.26 (H) 12/12/2021   BUN 24 (H) 12/12/2021   CO2 29 12/12/2021   Comparison labs at the [facility]:  10/06/21: Creatinine 1.310, BUN 24, glucose 120, calcium 11.2, albumin 4.1, total bili 0.6, Alk Phos 66, AST 17, ALT 44, eGFR 62, intact PTH 68.0 09/30/21: Creatinine 1.380, BUN 31, eGFR 60 03/26/21: A1c 6.2%    IMAGES: PET Scan 11/30/21: IMPRESSION: Signs of esophageal  uptake and thickening in this patient with known esophageal neoplasm likely represents the site of disease.   Heterogeneity and potential thickening near the gastric fundus in the setting of small hiatal hernia is nonspecific and potentially physiologic. However, in light of distal esophageal neoplasm would correlate with the recent endoscopic results to ensure no gastric abnormality.   Diffuse mediastinal adenopathy, areas imaged on prior studies are similar and many areas with calcification, pattern of adenopathy and uptake would favor process such as sarcoidosis. Unfortunately this confounds assessment for metastatic disease. The most suspicious node currently is a juxta crural lymph node on the RIGHT adjacent to the celiac axis though again this could be related to underlying sarcoidosis.   No definitive signs of metastatic disease with above limitations as outlined.   Nephrolithiasis.   Colonic diverticulosis.   Heterogeneous uptake in the prostate is nonspecific. Consider urologic follow-up with PSA as warranted.    EKG: Last EKG summary seen is from 06/07/27/ [facility]). Per Narrative: SR, first degree AV block, low voltage, non-specific T wave abnormality.    CV: N/A  Past Medical History:  Diagnosis Date   Anxiety    COVID 2022   mild case   History of kidney stones    Hypertension    Kidney infection    at age 46   Pneumonia    9 years ago   Pre-diabetes    PTSD (post-traumatic stress disorder)     Past Surgical History:  Procedure Laterality Date   BIOPSY  11/13/2021   Procedure: BIOPSY;  Surgeon: Jerene Bears, MD;  Location: [facility] ENDOSCOPY;  Service: Gastroenterology;;   ESOPHAGOGASTRODUODENOSCOPY N/A 11/13/2021   Procedure: ESOPHAGOGASTRODUODENOSCOPY (EGD);  Surgeon: Jerene Bears, MD;  Location: [facility] ENDOSCOPY;  Service: Gastroenterology;  Laterality: N/A;   FOREIGN BODY REMOVAL  11/13/2021   Procedure: FOREIGN BODY REMOVAL;  Surgeon: Jerene Bears, MD;   Location: [facility] ENDOSCOPY;  Service: Gastroenterology;;   HUMERUS FRACTURE SURGERY Right    TONSILLECTOMY AND ADENOIDECTOMY      MEDICATIONS: No current facility-administered medications for this encounter.    Alogliptin Benzoate 25 MG TABS   amLODipine (NORVASC) 10 MG tablet   Ascorbic Acid (VITAMIN C) 1000 MG tablet   atenolol (TENORMIN) 25 MG tablet   Cholecalciferol (VITAMIN D3) 50 MCG (2000 UT) capsule   CINNAMON PO   diphenhydramine-acetaminophen (TYLENOL PM) 25-500 MG TABS tablet   ibuprofen (ADVIL) 200 MG tablet   Krill Oil 350 MG CAPS   lisinopril (PRINIVIL,ZESTRIL) 40 MG tablet   MAGNESIUM CITRATE PO   Melatonin 10 MG TABS   olopatadine (PATANOL) 0.1 % ophthalmic solution   pioglitazone (ACTOS) 30 MG tablet   Potassium 99 MG TABS   terbinafine (LAMISIL) 250 MG tablet   vitamin E 180 MG (400 UNITS) capsule    Myra Gianotti, PA-C Surgical Short Stay/Anesthesiology [facility] Phone [phone] [facility] Phone [phone] 12/12/2021 4:32 PM

## 2021-12-13 LAB — PROSTATE-SPECIFIC AG, SERUM (LABCORP): Prostate Specific Ag, Serum: 0.6 ng/mL (ref 0.0–4.0)

## 2021-12-14 ENCOUNTER — Encounter (HOSPITAL_COMMUNITY)
Admission: RE | Disposition: A | Discharge status: Home / Self Care | Payer: Self-pay | Source: Home / Self Care | Attending: Emergency Medicine

## 2021-12-14 ENCOUNTER — Institutional Professional Consult (permissible substitution): Payer: Non-veteran care | Admitting: Pulmonary Disease

## 2021-12-14 ENCOUNTER — Ambulatory Visit (HOSPITAL_BASED_OUTPATIENT_CLINIC_OR_DEPARTMENT_OTHER): Payer: No Typology Code available for payment source | Admitting: Vascular Surgery

## 2021-12-14 ENCOUNTER — Ambulatory Visit (HOSPITAL_COMMUNITY)
Admission: RE | Admit: 2021-12-14 | Discharge: 2021-12-14 | Disposition: A | Discharge status: Home / Self Care | Payer: No Typology Code available for payment source | Attending: Emergency Medicine | Admitting: Emergency Medicine

## 2021-12-14 ENCOUNTER — Ambulatory Visit (HOSPITAL_COMMUNITY): Payer: No Typology Code available for payment source | Admitting: Vascular Surgery

## 2021-12-14 ENCOUNTER — Encounter (HOSPITAL_COMMUNITY): Payer: Self-pay | Admitting: Emergency Medicine

## 2021-12-14 ENCOUNTER — Other Ambulatory Visit: Payer: Self-pay

## 2021-12-14 DIAGNOSIS — K2289 Other specified disease of esophagus: Secondary | ICD-10-CM

## 2021-12-14 DIAGNOSIS — Z8616 Personal history of COVID-19: Secondary | ICD-10-CM | POA: Insufficient documentation

## 2021-12-14 DIAGNOSIS — Z87891 Personal history of nicotine dependence: Secondary | ICD-10-CM | POA: Insufficient documentation

## 2021-12-14 DIAGNOSIS — R590 Localized enlarged lymph nodes: Secondary | ICD-10-CM | POA: Diagnosis not present

## 2021-12-14 DIAGNOSIS — D649 Anemia, unspecified: Secondary | ICD-10-CM | POA: Insufficient documentation

## 2021-12-14 DIAGNOSIS — N200 Calculus of kidney: Secondary | ICD-10-CM | POA: Insufficient documentation

## 2021-12-14 DIAGNOSIS — K3189 Other diseases of stomach and duodenum: Secondary | ICD-10-CM | POA: Insufficient documentation

## 2021-12-14 DIAGNOSIS — Z6841 Body Mass Index (BMI) 40.0 and over, adult: Secondary | ICD-10-CM | POA: Insufficient documentation

## 2021-12-14 DIAGNOSIS — C155 Malignant neoplasm of lower third of esophagus: Secondary | ICD-10-CM

## 2021-12-14 DIAGNOSIS — R933 Abnormal findings on diagnostic imaging of other parts of digestive tract: Secondary | ICD-10-CM | POA: Diagnosis not present

## 2021-12-14 DIAGNOSIS — I10 Essential (primary) hypertension: Secondary | ICD-10-CM | POA: Diagnosis not present

## 2021-12-14 DIAGNOSIS — F4310 Post-traumatic stress disorder, unspecified: Secondary | ICD-10-CM | POA: Insufficient documentation

## 2021-12-14 DIAGNOSIS — E119 Type 2 diabetes mellitus without complications: Secondary | ICD-10-CM | POA: Diagnosis not present

## 2021-12-14 DIAGNOSIS — K5730 Diverticulosis of large intestine without perforation or abscess without bleeding: Secondary | ICD-10-CM | POA: Insufficient documentation

## 2021-12-14 DIAGNOSIS — I4510 Unspecified right bundle-branch block: Secondary | ICD-10-CM | POA: Insufficient documentation

## 2021-12-14 DIAGNOSIS — K449 Diaphragmatic hernia without obstruction or gangrene: Secondary | ICD-10-CM

## 2021-12-14 DIAGNOSIS — F419 Anxiety disorder, unspecified: Secondary | ICD-10-CM | POA: Insufficient documentation

## 2021-12-14 DIAGNOSIS — K229 Disease of esophagus, unspecified: Secondary | ICD-10-CM | POA: Diagnosis not present

## 2021-12-14 HISTORY — PX: VIDEO BRONCHOSCOPY WITH ENDOBRONCHIAL ULTRASOUND: SHX6177

## 2021-12-14 HISTORY — DX: Pneumonia, unspecified organism: J18.9

## 2021-12-14 HISTORY — DX: Prediabetes: R73.03

## 2021-12-14 HISTORY — PX: ESOPHAGOGASTRODUODENOSCOPY (EGD) WITH PROPOFOL: SHX5813

## 2021-12-14 HISTORY — DX: Personal history of urinary calculi: Z87.442

## 2021-12-14 HISTORY — PX: BRONCHIAL NEEDLE ASPIRATION BIOPSY: SHX5106

## 2021-12-14 HISTORY — PX: BRONCHIAL BRUSHINGS: SHX5108

## 2021-12-14 HISTORY — DX: Post-traumatic stress disorder, unspecified: F43.10

## 2021-12-14 HISTORY — DX: Anxiety disorder, unspecified: F41.9

## 2021-12-14 HISTORY — DX: Renal tubulo-interstitial disease, unspecified: N15.9

## 2021-12-14 HISTORY — PX: BIOPSY: SHX5522

## 2021-12-14 HISTORY — PX: EUS: SHX5427

## 2021-12-14 HISTORY — PX: FINE NEEDLE ASPIRATION: SHX5430

## 2021-12-14 SURGERY — BRONCHOSCOPY, WITH EBUS
Anesthesia: General

## 2021-12-14 SURGERY — UPPER ENDOSCOPIC ULTRASOUND (EUS) RADIAL
Anesthesia: Monitor Anesthesia Care

## 2021-12-14 MED ORDER — SODIUM CHLORIDE 0.9 % IV SOLN: INTRAVENOUS | Status: DC

## 2021-12-14 MED ORDER — PROPOFOL 10 MG/ML IV BOLUS
INTRAVENOUS | Status: DC | PRN
  Administered 2021-12-14: 200 mg via INTRAVENOUS

## 2021-12-14 MED ORDER — EPHEDRINE SULFATE-NACL 50-0.9 MG/10ML-% IV SOSY
PREFILLED_SYRINGE | INTRAVENOUS | Status: DC | PRN
  Administered 2021-12-14 (×2): 10 mg via INTRAVENOUS
  Administered 2021-12-14: 5 mg via INTRAVENOUS
  Administered 2021-12-14: 10 mg via INTRAVENOUS

## 2021-12-14 MED ORDER — ONDANSETRON HCL 4 MG/2ML IJ SOLN
INTRAMUSCULAR | Status: DC | PRN
  Administered 2021-12-14: 4 mg via INTRAVENOUS

## 2021-12-14 MED ORDER — CHLORHEXIDINE GLUCONATE 0.12 % MT SOLN
15.0000 mL | Freq: Once | OROMUCOSAL | Status: AC
Start: 2021-12-14 — End: 2021-12-14
  Administered 2021-12-14: 15 mL via OROMUCOSAL
  Filled 2021-12-14 (×2): qty 15

## 2021-12-14 MED ORDER — ACETAMINOPHEN 500 MG PO TABS
1000.0000 mg | ORAL_TABLET | Freq: Once | ORAL | Status: AC
  Administered 2021-12-14: 1000 mg via ORAL
  Filled 2021-12-14: qty 2

## 2021-12-14 MED ORDER — MIDAZOLAM HCL 2 MG/2ML IJ SOLN
INTRAMUSCULAR | Status: DC | PRN
  Administered 2021-12-14: 2 mg via INTRAVENOUS

## 2021-12-14 MED ORDER — LACTATED RINGERS IV SOLN: INTRAVENOUS | Status: DC

## 2021-12-14 MED ORDER — CIPROFLOXACIN IN D5W 400 MG/200ML IV SOLN
INTRAVENOUS | Status: AC
  Filled 2021-12-14: qty 200

## 2021-12-14 MED ORDER — LIDOCAINE 2% (20 MG/ML) 5 ML SYRINGE
INTRAMUSCULAR | Status: DC | PRN
  Administered 2021-12-14: 60 mg via INTRAVENOUS

## 2021-12-14 MED ORDER — ROCURONIUM BROMIDE 10 MG/ML (PF) SYRINGE
PREFILLED_SYRINGE | INTRAVENOUS | Status: DC | PRN
  Administered 2021-12-14: 60 mg via INTRAVENOUS
  Administered 2021-12-14: 40 mg via INTRAVENOUS

## 2021-12-14 MED ORDER — FENTANYL CITRATE (PF) 100 MCG/2ML IJ SOLN
INTRAMUSCULAR | Status: DC | PRN
  Administered 2021-12-14: 100 ug via INTRAVENOUS

## 2021-12-14 MED ORDER — PHENYLEPHRINE HCL-NACL 20-0.9 MG/250ML-% IV SOLN
INTRAVENOUS | Status: DC | PRN
  Administered 2021-12-14: 50 ug/min via INTRAVENOUS

## 2021-12-14 MED ORDER — CIPROFLOXACIN IN D5W 400 MG/200ML IV SOLN
INTRAVENOUS | Status: DC | PRN
  Administered 2021-12-14: 400 mg via INTRAVENOUS

## 2021-12-14 MED ORDER — DEXAMETHASONE SODIUM PHOSPHATE 10 MG/ML IJ SOLN
INTRAMUSCULAR | Status: DC | PRN
  Administered 2021-12-14: 10 mg via INTRAVENOUS

## 2021-12-14 MED ORDER — DIPHENHYDRAMINE-APAP (SLEEP) 25-500 MG PO TABS: 2.0000 | ORAL_TABLET | Freq: Every day | ORAL | Status: DC

## 2021-12-14 MED ORDER — PHENYLEPHRINE 80 MCG/ML (10ML) SYRINGE FOR IV PUSH (FOR BLOOD PRESSURE SUPPORT)
PREFILLED_SYRINGE | INTRAVENOUS | Status: DC | PRN
  Administered 2021-12-14: 80 ug via INTRAVENOUS
  Administered 2021-12-14: 160 ug via INTRAVENOUS

## 2021-12-14 MED ORDER — SUGAMMADEX SODIUM 200 MG/2ML IV SOLN
INTRAVENOUS | Status: DC | PRN
  Administered 2021-12-14: 400 mg via INTRAVENOUS

## 2021-12-14 SURGICAL SUPPLY — 15 items

## 2021-12-14 NOTE — H&P (Signed)
GASTROENTEROLOGY PROCEDURE H&P NOTE   Primary Care Physician: [facility]  HPI: Keith Moreno is a 61 y.o. male who presents for combinged EBUS/EUS to further evaluate lymph nodes in the mediastinum and in the locoregional chains with recently diagnosed esophageal cancer to try and differentiate inflammatory lymphadenopathy v metastatic disease.  EBUS with then EUS planned at this time.  Past Medical History:  Diagnosis Date   Anxiety    COVID 2022   mild case   History of kidney stones    Hypertension    Kidney infection    at age 45   Pneumonia    9 years ago   Pre-diabetes    PTSD (post-traumatic stress disorder)    Past Surgical History:  Procedure Laterality Date   BIOPSY  11/13/2021   Procedure: BIOPSY;  Surgeon: Jerene Bears, MD;  Location: [facility] ENDOSCOPY;  Service: Gastroenterology;;   ESOPHAGOGASTRODUODENOSCOPY N/A 11/13/2021   Procedure: ESOPHAGOGASTRODUODENOSCOPY (EGD);  Surgeon: Jerene Bears, MD;  Location: [facility] ENDOSCOPY;  Service: Gastroenterology;  Laterality: N/A;   FOREIGN BODY REMOVAL  11/13/2021   Procedure: FOREIGN BODY REMOVAL;  Surgeon: Jerene Bears, MD;  Location: [facility] ENDOSCOPY;  Service: Gastroenterology;;   HUMERUS FRACTURE SURGERY Right    TONSILLECTOMY AND ADENOIDECTOMY     Current Facility-Administered Medications  Medication Dose Route Frequency Provider Last Rate Last Admin   lactated ringers infusion   Intravenous Continuous Ellender, Karyl Kinnier, MD 10 mL/hr at 12/14/21 0808 New Bag at 12/14/21 0808    Current Facility-Administered Medications:    lactated ringers infusion, , Intravenous, Continuous, Ellender, Karyl Kinnier, MD, Last Rate: 10 mL/hr at 12/14/21 0808, New Bag at 12/14/21 0808 Allergies  Allergen Reactions   Procaine     Other reaction(s): Other, Respiratory Distress   Chocolate     Hyperactivity, respiratory distress   Other     Novocaine- respiratory distress   Family History  Problem Relation Age of Onset    Hypertension Mother    Diabetes Mother    Bipolar disorder Mother    Hypertension Father    Kidney cancer Sister    Colon cancer Paternal Uncle    Cancer Cousin    Bladder Cancer Cousin    Cancer Cousin    Social History   Socioeconomic History   Marital status: Single    Spouse name: Not on file   Number of children: Not on file   Years of education: Not on file   Highest education level: Not on file  Occupational History   Not on file  Tobacco Use   Smoking status: Former    Types: Cigars, Cigarettes, Pipe    Quit date: 1983    Years since quitting: 40.4   Smokeless tobacco: Former    Types: Chew    Quit date: 1983  Vaping Use   Vaping Use: Never used  Substance and Sexual Activity   Alcohol use: Yes    Comment: Occasional   Drug use: No   Sexual activity: Not on file  Other Topics Concern   Not on file  Social History Narrative   Not on file   Social Determinants of Health   Financial Resource Strain: Not on file  Food Insecurity: Not on file  Transportation Needs: Not on file  Physical Activity: Not on file  Stress: Not on file  Social Connections: Not on file  Intimate Partner Violence: Not on file    Physical Exam: Today's Vitals   12/14/21 0708 12/14/21 6222  BP: 135/86   Pulse: (!) 58   Resp: 18   Temp: 97.7 F (36.5 C)   SpO2: 98%   Weight: (!) 157.4 kg   Height: 6' (1.829 m)   PainSc:  5    Body mass index is 47.06 kg/m. GEN: NAD EYE: Sclerae anicteric ENT: MMM CV: Non-tachycardic GI: Soft, NT/ND NEURO:  Alert & Oriented x 3  Lab Results: Recent Labs    12/12/21 1103  WBC 4.8  HGB 12.5*  HCT 36.9*  PLT 217   BMET Recent Labs    12/12/21 1103  NA 136  K 4.2  CL 102  CO2 29  GLUCOSE 91  BUN 24*  CREATININE 1.26*  CALCIUM 12.6*   LFT Recent Labs    12/12/21 1103  PROT 7.4  ALBUMIN 4.7  AST 15  ALT 15  ALKPHOS 57  BILITOT 0.5   PT/INR No results for input(s): LABPROT, INR in the last 72  hours.   Impression / Plan: This is a 61 y.o.male who presents for combinged EBUS/EUS to further evaluate lymph nodes in the mediastinum and in the locoregional chains with recently diagnosed esophageal cancer to try and differentiate inflammatory lymphadenopathy v metastatic disease.  EBUS with then EUS planned at this time.  The risks of an EUS including intestinal perforation, bleeding, infection, aspiration, and medication effects were discussed as was the possibility it may not give a definitive diagnosis if a biopsy is performed.  When a biopsy of the pancreas is done as part of the EUS, there is an additional risk of pancreatitis at the rate of about 1-2%.  It was explained that procedure related pancreatitis is typically mild, although it can be severe and even life threatening, which is why we do not perform random pancreatic biopsies and only biopsy a lesion/area we feel is concerning enough to warrant the risk.  The risks and benefits of endoscopic evaluation/treatment were discussed with the patient and/or family; these include but are not limited to the risk of perforation, infection, bleeding, missed lesions, lack of diagnosis, severe illness requiring hospitalization, as well as anesthesia and sedation related illnesses.  The patient's history has been reviewed, patient examined, no change in status, and deemed stable for procedure.  The patient and/or family is agreeable to proceed.    Justice Britain, MD [facility] Office # [phone]

## 2021-12-14 NOTE — Anesthesia Procedure Notes (Signed)
Procedure Name: Intubation Date/Time: 12/14/2021 9:07 AM Performed by: Lance Coon, CRNA Pre-anesthesia Checklist: Patient identified, Emergency Drugs available, Suction available, Timeout performed and Patient being monitored Patient Re-evaluated:Patient Re-evaluated prior to induction Oxygen Delivery Method: Circle system utilized Preoxygenation: Pre-oxygenation with 100% oxygen Induction Type: IV induction Ventilation: Mask ventilation without difficulty and Oral airway inserted - appropriate to patient size Laryngoscope Size: Miller and 3 Grade View: Grade I Tube type: Oral Tube size: 8.5 mm Number of attempts: 1 Airway Equipment and Method: Stylet Placement Confirmation: ETT inserted through vocal cords under direct vision, positive ETCO2 and breath sounds checked- equal and bilateral Secured at: 21 cm Tube secured with: Tape Dental Injury: Teeth and Oropharynx as per pre-operative assessment

## 2021-12-14 NOTE — Op Note (Signed)
Video Bronchoscopy with Endobronchial Ultrasound Procedure Note  Date of Operation: 12/14/2021  Pre-op Diagnosis: Mediastinal adenopathy, history of esophageal cancer  Post-op Diagnosis: Same  Surgeon: Baltazar Apo  Assistants: None  Anesthesia: General endotracheal anesthesia  Operation: Flexible video fiberoptic bronchoscopy with endobronchial ultrasound and biopsies.  Estimated Blood Loss: Minimal  Complications: None apparent  Indications and History: Keith Moreno is a 61 y.o. male with history of newly diagnosed esophageal adenocarcinoma.  He also has calcified hypermetabolic mediastinal lymphadenopathy on imaging.  Recommendation made to achieve a tissue diagnosis on his nodal disease via endobronchial ultrasound with needle biopsies.  The risks, benefits, complications, treatment options and expected outcomes were discussed with the patient.  The possibilities of pneumothorax, pneumonia, reaction to medication, pulmonary aspiration, perforation of a viscus, bleeding, failure to diagnose a condition and creating a complication requiring transfusion or operation were discussed with the patient who freely signed the consent.    Description of Procedure: The patient was examined in the preoperative area and history and data from the preprocedure consultation were reviewed. It was deemed appropriate to proceed.  [facility], identified as Keith Moreno and the procedure verified as Flexible Video Fiberoptic Bronchoscopy.  A Time Out was held and the above information confirmed. After being taken to the operating room general anesthesia was initiated and the patient  was orally intubated. The video fiberoptic bronchoscope was introduced via the endotracheal tube and a general inspection was performed which showed normal airways throughout with the exception of a narrowed segmental airway in the left upper lobe.  Endobronchial brushings were  performed in the narrowed airway to be sent for cytology. The standard scope was then withdrawn and the endobronchial ultrasound was used to identify and characterize the peritracheal, hilar and bronchial lymph nodes. Inspection showed significant widespread enlargement of nodes in the central mediastinum including stations 4R, 4L and 7.  There was no apparent enlargement more distally. Using real-time ultrasound guidance Wang needle biopsies were take from Station 4R, 4L, 7 nodes and were sent for cytology. The patient tolerated the procedure well without apparent complications. There was no significant blood loss. The bronchoscope was withdrawn.  The patient is planned to undergo EUS to evaluate more distal paraesophageal lymphadenopathy.  Please refer also to Dr. Donneta Romberg procedure note.  Samples: 1. Wang needle biopsies from 4R node 2. Wang needle biopsies from 4L node 3. Wang needle biopsies from 7 node 4.  Endobronchial brushings from left upper lobe segmental airway  Plans:  The patient will be discharged from the PACU to home when recovered from EUS and general anesthesia. We will review the cytology results with the patient when they become available. Outpatient followup will be with Dr. Valeta Harms.    Baltazar Apo, MD, PhD 12/14/2021, 10:15 AM [facility] [phone] or if no answer before 7:00PM call [phone] For any issues after 7:00PM please call eLink [phone]

## 2021-12-14 NOTE — Op Note (Signed)
[facility] Patient Name: Keith Moreno Procedure Date : 12/14/2021 MRN: [id number] Attending MD: Justice Britain , MD Date of Birth: 30-Jun-1961 CSN: [id number] Age: 62 Admit Type: Outpatient Procedure:                Upper EUS Indications:              Abnormal abdominal PET scan, Pre-treatment staging                            of esophageal adenocarcinoma; Procedure followed                            the EBUS Providers:                Justice Britain, MD, Glori Bickers, RN, William Dalton, Technician, Darliss Cheney, Technician Referring MD:             Lajuan Lines. Hilarie Fredrickson, MD, Jodelle Gross Medicines:                General Anesthesia, Cipro 845 mg IV Complications:            No immediate complications. Estimated Blood Loss:     Estimated blood loss was minimal. Procedure:                Pre-Anesthesia Assessment:                           - Prior to the procedure, a History and Physical                            was performed, and patient medications and                            allergies were reviewed. The patient's tolerance of                            previous anesthesia was also reviewed. The risks                            and benefits of the procedure and the sedation                            options and risks were discussed with the patient.                            All questions were answered, and informed consent                            was obtained. Prior Anticoagulants: The patient has                            taken no previous anticoagulant or antiplatelet  agents except for NSAID medication. ASA Grade                            Assessment: III - A patient with severe systemic                            disease. After reviewing the risks and benefits,                            the patient was deemed in satisfactory condition to                            undergo the procedure.                            After obtaining informed consent, the endoscope was                            passed under direct vision. Throughout the                            procedure, the patient's blood pressure, pulse, and                            oxygen saturations were monitored continuously. The                            [id number]-H190 ([id number]) Olympus endoscope was introduced                            through the mouth, and advanced to the second part                            of duodenum. The GF-UE190-AL5 ([id number]) Olympus                            radial ultrasound scope was introduced through the                            mouth, and advanced to the stomach for ultrasound                            examination from the esophagus and stomach. The                            GF-[id number] ([id number]) Olympus linear ultrasound scope                            was introduced through the mouth, and advanced to                            the esophagus for ultrasound examination. The upper  EUS was accomplished without difficulty. The                            patient tolerated the procedure. Scope In: Scope Out: Findings:      ENDOSCOPIC FINDING: :      No gross lesions were noted in the proximal esophagus.      There were esophageal mucosal changes consistent with long-segment       Barrett's esophagus present in the distal esophagus extending to the GE       Jxn (with the noted mass below within this). The maximum longitudinal       extent of these mucosal changes was 8 cm in length.      A large, fungating and ulcerating mass (consistent with known       adenocarcinoma) with no active bleeding but stigmata of recent bleeding       was found in the distal esophagus, 34 to 40 cm from the incisors. The       mass was partially obstructing and circumferential. The endoscope with       gentle pressure could traverse this area as did the radial EUS, the       linear EUS  could not traverse this.      A 2 cm hiatal hernia was present.      Multiple dispersed small erosions with no bleeding and no stigmata of       recent bleeding were found in the gastric antrum.      No other gross lesions were noted in the entire examined stomach.       Biopsies were taken with a cold forceps for histology and Helicobacter       pylori testing.      No gross lesions were noted in the duodenal bulb, in the first portion       of the duodenum and in the second portion of the duodenum.      ENDOSONOGRAPHIC FINDING: :      A hypoechoic mass was found in the thoracic esophagus extending to the       gastroesophageal junction. The mass was encountered at 34 cm from the       incisors and extended to 40 cm. The lesion was circumferential. The       endosonographic borders were irregular. The mass measured up to 13 mm in       thickness. There was sonographic evidence suggesting invasion into the       muscularis propria (Layer 4) and through it into the adventitia (Layer       5).      Two more concerning malignant-appearing lymph nodes were visualized in       the diaphragmatic region (level 15) with the ultrasound probe located 39       cm from the incisors. The largest measured 11 mm by 10 mm in maximal       cross-sectional diameter. The nodes were round, hypoechoic and had well       defined margins. Unable to perform FNA/FNB as it would require going       through the mass in the region.      Multiple enlarged lymph nodes were visualized in the middle       paraesophageal mediastinum (level 17M) and lower paraesophageal       mediastinum (level 8L) with the ultrasound probe located 31 cm from the  incisors. The largest measured 30 mm by 21 mm in maximal cross-sectional       diameter. The nodes were irregular, heterogenous and had well defined       margins. Fine needle biopsy was performed. Color Doppler imaging was       utilized prior to needle puncture to confirm  a lack of significant       vascular structures within the needle path. Five passes were made with       the Acquire 22 gauge ultrasound core biopsy needle using a       transesophageal approach. A visible core of tissue was obtained.       Preliminary cytologic examination and touch preps were performed. Final       cytology results are pending.      Endosonographic imaging in the cardia of the stomach, in the fundus of       the stomach and in the body of the stomach showed no extrinsic       compression, intramural (subepithelial) lesion, mass, varices or wall       thickening.      Endosonographic imaging in the visualized portion of the liver showed no       mass.      The celiac region was visualized. Impression:               EGD Impression:                           - No gross lesions in esophagus proximally.                           - Esophageal mucosal changes consistent with                            long-segment Barrett's esophagus with notation of a                            partially obstructing, malignant esophageal tumor                            was found in the distal esophagus. I could only                            traverse EGD and radial EGD scope through this                            area, linear EUS could not traverse the mass.                           - 2 cm hiatal hernia.                           - Erosive gastropathy with no bleeding and no                            stigmata of recent bleeding in antrum. No gross  lesions in the stomach. Biopsied.                           - No gross lesions in the duodenal bulb, in the                            first portion of the duodenum and in the second                            portion of the duodenum.                           EUS Impression:                           - Two malignant-appearing lymph nodes were                            visualized in the diaphragmatic region (level  15)                            but due to their location with the known cancer                            could not be biopsied. Multiple enlarged lymph                            nodes were visualized in the middle paraesophageal                            mediastinum (level 34M) and lower paraesophageal                            mediastinum (level 8L). Fine needle biopsy                            performed of the lesion that could be reached                            without traversing the malignancy.                           - A mass was found in the thoracic esophagus                            extending to the gastroesophageal junction. A                            tissue diagnosis was obtained prior to this exam.                            This is consistent with adenocarcinoma. This was  staged T3 Nx Mx by endosonographic criteria. The                            staging applies if malignancy is confirmed. The                            nodal staging is as a result of what is noted above                            and why we cannot put a number to this. Recommendation:           - The patient will be observed post-procedure,                            until all discharge criteria are met.                           - Discharge patient to home.                           - Patient has a contact number available for                            emergencies. The signs and symptoms of potential                            delayed complications were discussed with the                            patient. Return to normal activities tomorrow.                            Written discharge instructions were provided to the                            patient.                           - Full liquid diet.                           - Observe patient's clinical course.                           - Await cytology results and await path results.                           -  Follow up with Oncology/Radiation Oncology as                            already planned.                           - The findings and recommendations were discussed  with the patient.                           - The findings and recommendations were discussed                            with the patient's family. Procedure Code(s):        --- Professional ---                           [phone], Esophagogastroduodenoscopy, flexible,                            transoral; with transendoscopic ultrasound-guided                            intramural or transmural fine needle                            aspiration/biopsy(s), (includes endoscopic                            ultrasound examination limited to the esophagus,                            stomach or duodenum, and adjacent structures) Diagnosis Code(s):        --- Professional ---                           K22.8, Other specified diseases of esophagus                           C15.5, Malignant neoplasm of lower third of                            esophagus                           K44.9, Diaphragmatic hernia without obstruction or                            gangrene                           K31.89, Other diseases of stomach and duodenum                           I89.9, Noninfective disorder of lymphatic vessels                            and lymph nodes, unspecified                           R59.0, Localized enlarged lymph nodes                           R93.5, Abnormal findings on diagnostic imaging of  other abdominal regions, including retroperitoneum CPT copyright 2019 [employer]. All rights reserved. The codes documented in this report are preliminary and upon coder review may  be revised to meet current compliance requirements. Justice Britain, MD 12/14/2021 11:42:09 AM Number of Addenda: 0

## 2021-12-14 NOTE — Discharge Instructions (Signed)
YOU HAD AN ENDOSCOPIC PROCEDURE TODAY: Refer to the procedure report and other information in the discharge instructions given to you for any specific questions about what was found during the examination. If this information does not answer your questions, please call [place] office at [phone] to clarify.  ° °YOU SHOULD EXPECT: Some feelings of bloating in the abdomen. Passage of more gas than usual. Walking can help get rid of the air that was put into your GI tract during the procedure and reduce the bloating. If you had a lower endoscopy (such as a colonoscopy or flexible sigmoidoscopy) you may notice spotting of blood in your stool or on the toilet paper. Some abdominal soreness may be present for a day or two, also. ° °DIET: Your first meal following the procedure should be a light meal and then it is ok to progress to your normal diet. A half-sandwich or bowl of soup is an example of a good first meal. Heavy or fried foods are harder to digest and may make you feel nauseous or bloated. Drink plenty of fluids but you should avoid alcoholic beverages for 24 hours. If you had a esophageal dilation, please see attached instructions for diet.   ° °ACTIVITY: Your care partner should take you home directly after the procedure. You should plan to take it easy, moving slowly for the rest of the day. You can resume normal activity the day after the procedure however YOU SHOULD NOT DRIVE, use power tools, machinery or perform tasks that involve climbing or major physical exertion for 24 hours (because of the sedation medicines used during the test).  ° °SYMPTOMS TO REPORT IMMEDIATELY: °A gastroenterologist can be reached at any hour. Please call [phone]  for any of the following symptoms:  °Following lower endoscopy (colonoscopy, flexible sigmoidoscopy) °Excessive amounts of blood in the stool  °Significant tenderness, worsening of abdominal pains  °Swelling of the abdomen that is new, acute  °Fever of 100° or  higher  °Following upper endoscopy (EGD, EUS, ERCP, esophageal dilation) °Vomiting of blood or coffee ground material  °New, significant abdominal pain  °New, significant chest pain or pain under the shoulder blades  °Painful or persistently difficult swallowing  °New shortness of breath  °Black, tarry-looking or red, bloody stools ° °FOLLOW UP:  °If any biopsies were taken you will be contacted by phone or by letter within the next 1-3 weeks. Call [phone]  if you have not heard about the biopsies in 3 weeks.  °Please also call with any specific questions about appointments or follow up tests. ° °

## 2021-12-14 NOTE — Interval H&P Note (Signed)
History and Physical Interval Note:  12/14/2021 8:53 AM  Keith Moreno  has presented today for surgery, with the diagnosis of adenopathy.  The various methods of treatment have been discussed with the patient and family. After consideration of risks, benefits and other options for treatment, the patient has consented to [place] as a surgical intervention.  The patient's history has been reviewed, patient examined, no change in status, stable for surgery.  I have reviewed the patient's chart and labs.  Questions were answered to the patient's satisfaction.     Collene Gobble

## 2021-12-14 NOTE — Transfer of Care (Signed)
Immediate Anesthesia Transfer of Care Note  Patient: Keith Moreno  Procedure(s) Performed: VIDEO BRONCHOSCOPY WITH ENDOBRONCHIAL ULTRASOUND (Bilateral) BRONCHIAL BRUSHINGS BRONCHIAL NEEDLE ASPIRATION BIOPSIES ESOPHAGOGASTRODUODENOSCOPY (EGD) WITH PROPOFOL UPPER ENDOSCOPIC ULTRASOUND (EUS) LINEAR BIOPSY FINE NEEDLE ASPIRATION (FNA) LINEAR   Patient Location: PACU  Anesthesia Type:General  Level of Consciousness: drowsy and patient cooperative  Airway & Oxygen Therapy: Patient Spontanous Breathing  Post-op Assessment: Report given to RN and Post -op Vital signs reviewed and stable  Post vital signs: Reviewed and stable  Last Vitals:  Vitals Value Taken Time  BP 119/45 12/14/21 1122  Temp    Pulse 64 12/14/21 1122  Resp 17 12/14/21 1122  SpO2 98 % 12/14/21 1122  Vitals shown include unvalidated device data.  Last Pain:  Vitals:   12/14/21 0734  PainSc: 5       Patients Stated Pain Goal: 3 (65/80/06 3494)  Complications: No notable events documented.

## 2021-12-15 ENCOUNTER — Telehealth: Payer: Self-pay | Admitting: *Deleted

## 2021-12-15 ENCOUNTER — Encounter (HOSPITAL_COMMUNITY): Payer: Self-pay | Admitting: Emergency Medicine

## 2021-12-15 ENCOUNTER — Other Ambulatory Visit: Payer: Self-pay | Admitting: Physician Assistant

## 2021-12-15 ENCOUNTER — Encounter: Payer: Self-pay | Admitting: Radiation Oncology

## 2021-12-15 ENCOUNTER — Telehealth: Payer: Self-pay | Admitting: Hematology and Oncology

## 2021-12-15 ENCOUNTER — Telehealth: Payer: Self-pay | Admitting: Emergency Medicine

## 2021-12-15 LAB — CYTOLOGY - NON PAP

## 2021-12-15 LAB — SURGICAL PATHOLOGY

## 2021-12-15 MED ORDER — PROCHLORPERAZINE MALEATE 10 MG PO TABS: 10.0000 mg | ORAL_TABLET | Freq: Four times a day (QID) | ORAL | 3 refills | Status: DC | PRN

## 2021-12-15 MED ORDER — ONDANSETRON 8 MG PO TBDP: 8.0000 mg | ORAL_TABLET | Freq: Three times a day (TID) | ORAL | 3 refills | Status: AC | PRN

## 2021-12-15 MED ORDER — LIDOCAINE-PRILOCAINE 2.5-2.5 % EX CREA: 1.0000 "application " | TOPICAL_CREAM | CUTANEOUS | 0 refills | Status: DC | PRN

## 2021-12-15 NOTE — Telephone Encounter (Signed)
Scheduled per 6/8 secure chat w Murray Hodgkins, pt has been called and confirmed

## 2021-12-15 NOTE — Anesthesia Postprocedure Evaluation (Signed)
Anesthesia Post Note  Patient: Keith Moreno  Procedure(s) Performed: VIDEO BRONCHOSCOPY WITH ENDOBRONCHIAL ULTRASOUND (Bilateral) BRONCHIAL BRUSHINGS BRONCHIAL NEEDLE ASPIRATION BIOPSIES ESOPHAGOGASTRODUODENOSCOPY (EGD) WITH PROPOFOL UPPER ENDOSCOPIC ULTRASOUND (EUS) LINEAR BIOPSY FINE NEEDLE ASPIRATION (FNA) LINEAR     Patient location during evaluation: PACU Anesthesia Type: General Level of consciousness: awake Pain management: pain level controlled Vital Signs Assessment: post-procedure vital signs reviewed and stable Respiratory status: spontaneous breathing, nonlabored ventilation, respiratory function stable and patient connected to nasal cannula oxygen Cardiovascular status: blood pressure returned to baseline and stable Postop Assessment: no apparent nausea or vomiting Anesthetic complications: no   No notable events documented.  Last Vitals:  Vitals:   12/14/21 1151 12/14/21 1206  BP: (!) 109/54 (!) 118/59  Pulse: (!) 58 (!) 58  Resp: 14 13  Temp:  (!) 36.3 C  SpO2: 94% 98%    Last Pain:  Vitals:   12/14/21 1206  PainSc: 0-No pain                 Jurney Overacker P Alivya Wegman

## 2021-12-15 NOTE — Telephone Encounter (Signed)
Spoke with the patient to let him know that Dr. Lisbeth Renshaw has reviewed his surgical notes and is waiting on the pathology report.   He is recommending radiation therapy and we have scheduled him for CT Simulation on 12/16/2021 at 2:00 pm.  Patient verbalized understanding and all questions were answered.   Gloriajean Dell. Leonie Green, BSN

## 2021-12-15 NOTE — Progress Notes (Signed)
Based on the results from his EUS and EBUS yesterday, we anticipate at least T3Nx disease and Dr. Lisbeth Renshaw would recommend chemoRT. We will coordinate simulation and await his biopsy results as well. I've messaged medical oncology and we'd like to try to aim to start treatment on 12/26/21

## 2021-12-15 NOTE — Telephone Encounter (Signed)
Called patient regarding his EBUS results, no answer but left a detailed VM.  Nodal tissue stations 4R, 4L and 7 all normal with no malignancy or granulomas. His LUL endobronchial brushings were normal also.

## 2021-12-15 NOTE — Telephone Encounter (Signed)
Connected with Diamantina Monks [phone] (home) regarding [employer] and Disability form delivered to navigating staff this nurse received this afternoon.   Advised of form process.  Allow 7-10 business days (14 calender), signed required [place] required to allow use/disclosure of PHI electronic, verbal or paper.    "I signed that on the form."   This signature authorizes [employer] to request information from provider for a determination.  "They had trouble reaching anyone in the office, snail mailed form to me.  You mean I have to come back.  I'm not that far away."  Obtained signed ROI.  Apologized for any inconvenience.  Thanked him for his cooperation. 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  Covered out through 12/19/2021 after a back injury.  Found out I have cancer while out with my back."  1603: Noted form previously received 12/07/2021 addressed to Dr. Lisbeth Renshaw.  Form brought in today for Dr. Lorenso Courier.

## 2021-12-16 ENCOUNTER — Ambulatory Visit
Admission: RE | Admit: 2021-12-16 | Discharge: 2021-12-16 | Disposition: A | Discharge status: Home / Self Care | Payer: No Typology Code available for payment source | Source: Ambulatory Visit | Attending: Radiation Oncology | Admitting: Radiation Oncology

## 2021-12-16 ENCOUNTER — Other Ambulatory Visit: Payer: Self-pay

## 2021-12-16 ENCOUNTER — Encounter: Payer: Self-pay | Admitting: Gastroenterology

## 2021-12-16 DIAGNOSIS — Z510 Encounter for antineoplastic radiation therapy: Secondary | ICD-10-CM | POA: Diagnosis present

## 2021-12-16 DIAGNOSIS — C155 Malignant neoplasm of lower third of esophagus: Secondary | ICD-10-CM | POA: Insufficient documentation

## 2021-12-19 ENCOUNTER — Other Ambulatory Visit: Payer: Self-pay | Admitting: Hematology and Oncology

## 2021-12-19 ENCOUNTER — Institutional Professional Consult (permissible substitution): Payer: Non-veteran care | Admitting: Pulmonary Disease

## 2021-12-19 NOTE — Progress Notes (Signed)
START ON PATHWAY REGIMEN - Gastroesophageal     Administer weekly during RT:     Paclitaxel      Carboplatin   **Always confirm dose/schedule in your pharmacy ordering system**  Patient Characteristics: Esophageal & GE Junction, Adenocarcinoma, Preoperative or Nonsurgical Candidate, M0 (Clinical Staging), cT2 or Higher or cN+, Surgical Candidate (Up to cT4a) - Preoperative Therapy, Esophageal Therapeutic Status: Preoperative or Nonsurgical Candidate, M0 (Clinical Staging) Histology: Adenocarcinoma Disease Classification: Esophageal AJCC Grade: G2 AJCC 8 Stage Grouping: IIB AJCC T Category: cT2 AJCC N Category: cN0 AJCC M Category: cM0 Intent of Therapy: Curative Intent, Discussed with Patient

## 2021-12-20 ENCOUNTER — Inpatient Hospital Stay: Payer: No Typology Code available for payment source

## 2021-12-20 ENCOUNTER — Other Ambulatory Visit: Payer: Self-pay

## 2021-12-21 ENCOUNTER — Telehealth: Payer: Self-pay

## 2021-12-21 NOTE — Telephone Encounter (Signed)
Notified Patient of completion of Disability Forms. Fax transmission confirmations received. Copy of forms placed for pick-up at Registration Desk #1 as requested by Patient. No other needs or concerns voiced at this time.

## 2021-12-22 ENCOUNTER — Telehealth: Payer: Self-pay | Admitting: *Deleted

## 2021-12-22 ENCOUNTER — Inpatient Hospital Stay: Payer: No Typology Code available for payment source

## 2021-12-22 ENCOUNTER — Encounter: Payer: Self-pay | Admitting: Hematology and Oncology

## 2021-12-22 ENCOUNTER — Other Ambulatory Visit: Payer: Self-pay | Admitting: Radiology

## 2021-12-22 NOTE — Telephone Encounter (Signed)
Received call from patient asking about his port placement tomorrow. He is asking if he needs a pre-op visit for this. Advised that he does not. He needs to arrive @ 11:30; nothing to eat or drink after 7 am and to make sure he has a driver. Pt voiced understanding. He is aware of his appts here on 12/26/21 for his first chemo treatment and md visit/labs

## 2021-12-22 NOTE — H&P (Signed)
Chief Complaint: Patient was seen in consultation today for [facility] placement at the request of Thayil,Irene T  Referring Physician(s): Lincoln Brigham  Supervising Physician: Aletta Edouard  Patient Status: [facility] - Out-pt  History of Present Illness: Keith Moreno is a 61 y.o. male with PMHs of HTN, pre-diabetes, and recent diagnosis of esophageal CA biopsy proven on 11/13/21.   Patient presented to ED complaining of dysphagia and food impaction, underwent EGD when a fungating mass in the lower third of eso was found. 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 Patient underwent PET on 11/30/21 which showed hypermetabolic activity in the distal esophagus as well as diffuse hypermetabolic mediastinal adenopathies.   Patient was referred to medical and radiation oncology, EBUS/ EUS was recommended to the patient for staging, which he underwent on 12/14/21. LUL brush bx, LANs, and gastric lesion were all negative for malignancy.  Based on EBUS/ EUS result, chemoradiation therapy was recommended to the patient by oncology team, and after thorough discussion and shared decision making, patient decided to proceed with chemoradiation therapy. A PAC placement was also recommended to the patient for long term CVC access, patient presents to [facility] IR today for the [facility] placement.   Patient laying in bed, not in acute distress.  Denise headache, fever, chills, shortness of breath, cough, chest pain, abdominal pain, nausea ,vomiting, and bleeding.  Past Medical History:  Diagnosis Date   Anxiety    COVID 2022   mild case   History of kidney stones    Hypertension    Kidney infection    at age 52   Pneumonia    9 years ago   Pre-diabetes    PTSD (post-traumatic stress disorder)     Past Surgical History:  Procedure Laterality Date   BIOPSY  11/13/2021   Procedure: BIOPSY;  Surgeon: Jerene Bears, MD;  Location: [place];  Service:  Gastroenterology;;   BIOPSY  12/14/2021   Procedure: BIOPSY;  Surgeon: Irving Copas., MD;  Location: [facility] ENDOSCOPY;  Service: Gastroenterology;;   BRONCHIAL BRUSHINGS  12/14/2021   Procedure: BRONCHIAL BRUSHINGS;  Surgeon: Collene Gobble, MD;  Location: [place];  Service: Cardiopulmonary;;   BRONCHIAL NEEDLE ASPIRATION BIOPSY  12/14/2021   Procedure: BRONCHIAL NEEDLE ASPIRATION BIOPSIES;  Surgeon: Collene Gobble, MD;  Location: [facility] ENDOSCOPY;  Service: Cardiopulmonary;;   ESOPHAGOGASTRODUODENOSCOPY N/A 11/13/2021   Procedure: ESOPHAGOGASTRODUODENOSCOPY (EGD);  Surgeon: Jerene Bears, MD;  Location: [facility] ENDOSCOPY;  Service: Gastroenterology;  Laterality: N/A;   ESOPHAGOGASTRODUODENOSCOPY (EGD) WITH PROPOFOL N/A 12/14/2021   Procedure: ESOPHAGOGASTRODUODENOSCOPY (EGD) WITH PROPOFOL;  Surgeon: Rush Landmark Telford Nab., MD;  Location: [place];  Service: Gastroenterology;  Laterality: N/A;   EUS N/A 12/14/2021   Procedure: UPPER ENDOSCOPIC ULTRASOUND (EUS) LINEAR;  Surgeon: Irving Copas., MD;  Location: [place];  Service: Gastroenterology;  Laterality: N/A;   FINE NEEDLE ASPIRATION  12/14/2021   Procedure: FINE NEEDLE ASPIRATION (FNA) LINEAR;  Surgeon: Irving Copas., MD;  Location: [place];  Service: Gastroenterology;;   FOREIGN BODY REMOVAL  11/13/2021   Procedure: FOREIGN BODY REMOVAL;  Surgeon: Jerene Bears, MD;  Location: [facility] ENDOSCOPY;  Service: Gastroenterology;;   HUMERUS FRACTURE SURGERY Right    TONSILLECTOMY AND ADENOIDECTOMY     VIDEO BRONCHOSCOPY WITH ENDOBRONCHIAL ULTRASOUND Bilateral 12/14/2021   Procedure: VIDEO BRONCHOSCOPY WITH ENDOBRONCHIAL ULTRASOUND;  Surgeon: Collene Gobble, MD;  Location: [place];  Service: Cardiopulmonary;  Laterality: Bilateral;    Allergies: Procaine, Chocolate, and Other  Medications: Prior to Admission medications   Medication Sig Start Date End Date Taking? Authorizing Provider  Alogliptin Benzoate 25 MG TABS  Take 12.5 mg by mouth at bedtime. 09/30/21   [provider]  amLODipine (NORVASC) 10 MG tablet Take 1 tablet by mouth at bedtime.    [provider]  Ascorbic Acid (VITAMIN C) 1000 MG tablet Take 2,000 mg by mouth every morning.    [provider]  atenolol (TENORMIN) 25 MG tablet Take 12.5 mg by mouth at bedtime. 10/30/21   [provider]  Cholecalciferol (VITAMIN D3) 50 MCG (2000 UT) capsule Take 20,000 Units by mouth at bedtime. 10 gel caps    [provider]  CINNAMON PO Take 4,000 mg by mouth 2 (two) times daily. 1000 mg each    [provider]  diphenhydramine-acetaminophen (TYLENOL PM) 25-500 MG TABS tablet Take 2 tablets by mouth at bedtime. 12/21/21   Mansouraty, Telford Nab., MD  ibuprofen (ADVIL) 200 MG tablet Take 400 mg by mouth at bedtime. 10/07/18   [provider]  Javier Docker Oil 350 MG CAPS Take 1,050 mg by mouth at bedtime.    [provider]  lidocaine-prilocaine (EMLA) cream Apply 1 application. topically as needed. 12/15/21   Lincoln Brigham, PA-C  lisinopril (PRINIVIL,ZESTRIL) 40 MG tablet Take 40 mg by mouth at bedtime.    [provider]  MAGNESIUM CITRATE PO Take 100 mg by mouth at bedtime.    [provider]  Melatonin 10 MG TABS Take 40 mg by mouth at bedtime as needed (Sleep).    [provider]  olopatadine (PATANOL) 0.1 % ophthalmic solution Place 1 drop into both eyes 2 (two) times daily. 10/28/21   [provider]  ondansetron (ZOFRAN-ODT) 8 MG disintegrating tablet Take 1 tablet (8 mg total) by mouth every 8 (eight) hours as needed for nausea or vomiting. 12/15/21   Lincoln Brigham, PA-C  pioglitazone (ACTOS) 30 MG tablet Take 30 mg by mouth every morning. 09/30/21   [provider]  Potassium 99 MG TABS Take 198 mg by mouth daily.    [provider]  prochlorperazine (COMPAZINE) 10 MG tablet Take 1 tablet (10 mg total) by mouth every 6 (six) hours as  needed for nausea or vomiting. 12/15/21   Lincoln Brigham, PA-C  terbinafine (LAMISIL) 250 MG tablet Take 250 mg by mouth every morning. 09/30/21   [provider]  vitamin E 180 MG (400 UNITS) capsule Take 1,200 Units by mouth 2 (two) times daily. Gel caps    [provider]     Family History  Problem Relation Age of Onset   Hypertension Mother    Diabetes Mother    Bipolar disorder Mother    Hypertension Father    Kidney cancer Sister    Colon cancer Paternal Uncle    Cancer Cousin    Bladder Cancer Cousin    Cancer Cousin     Social History   Socioeconomic History   Marital status: Single    Spouse name: Not on file   Number of children: Not on file   Years of education: Not on file   Highest education level: Not on file  Occupational History   Not on file  Tobacco Use   Smoking status: Former    Types: Cigars, Cigarettes, Pipe    Quit date: 1983    Years since quitting: 40.4   Smokeless tobacco: Former    Types: Risk analyst  date: 72  Vaping Use   Vaping Use: Never used  Substance and Sexual Activity   Alcohol use: Yes    Comment: Occasional   Drug use: No   Sexual activity: Not on file  Other Topics Concern   Not on file  Social History Narrative   Not on file   Social Determinants of Health   Financial Resource Strain: Not on file  Food Insecurity: Not on file  Transportation Needs: Not on file  Physical Activity: Not on file  Stress: Not on file  Social Connections: Not on file     Review of Systems: A 12 point ROS discussed and pertinent positives are indicated in the HPI above.  All other systems are negative.  Vital Signs: BP (!) 154/95   Pulse 63   Temp 98 F (36.7 C) (Oral)   Resp 18   SpO2 98%    Physical Exam Vitals reviewed.  Constitutional:      General: He is not in acute distress.    Appearance: Normal appearance. He is not ill-appearing.  HENT:     Head: Normocephalic.     Mouth/Throat:     Mouth:  Mucous membranes are moist.     Pharynx: Oropharynx is clear.     Comments: Missing bottom teeth  Cardiovascular:     Rate and Rhythm: Normal rate and regular rhythm.     Heart sounds: Normal heart sounds.  Pulmonary:     Effort: Pulmonary effort is normal.     Breath sounds: Normal breath sounds.  Abdominal:     General: Abdomen is flat. Bowel sounds are normal.     Palpations: Abdomen is soft.  Musculoskeletal:     Cervical back: Neck supple.  Skin:    General: Skin is warm and dry.     Coloration: Skin is not jaundiced or pale.  Neurological:     Mental Status: He is alert and oriented to person, place, and time.  Psychiatric:        Mood and Affect: Mood normal.        Behavior: Behavior normal.        Judgment: Judgment normal.     MD Evaluation Airway: WNL Heart: WNL Abdomen: WNL Chest/ Lungs: WNL ASA  Classification: 3 Mallampati/Airway Score: One  Imaging: NM PET Image Initial (PI) Skull Base To Thigh (F-18 FDG)  Result Date: 12/02/2021 CLINICAL DATA:  Initial treatment strategy for esophageal adenocarcinoma in a 61 year old male. EXAM: NUCLEAR MEDICINE PET SKULL BASE TO THIGH TECHNIQUE: 15.5 mCi F-18 FDG was injected intravenously. Full-ring PET imaging was performed from the skull base to thigh after the radiotracer. CT data was obtained and used for attenuation correction and anatomic localization. Fasting blood glucose: 111 mg/dl COMPARISON:  Imaging from January 08, 2015. FINDINGS: Mediastinal blood pool activity: SUV max 3.17 Liver activity: SUV max NA NECK: Scattered small lymph nodes in the LEFT neck at level III and IV (image 43/4) maximum SUV 3.3 measuring approximately 7-8 mm. No additional areas in the neck of concern. Incidental CT findings: None CHEST: Distal esophageal uptake with a maximum SUV 7.7 (image 90/4) this is in an area of esophageal thickening at the distal esophagus measuring up to 15 mm with some eccentric thickening in this location. Metabolic  activity spans a segment of the esophagus just above the GE junction. Diffuse mediastinal adenopathy many of these lymph nodes show dense calcification all nearly symmetric with respect to metabolic activity and many present on imaged portions of  the chest included on the abdominal CT from January 08, 2015, for instance on image 67/4 maximum SUV of the LEFT hilum 8.22 and maximum SUV along RIGHT paratracheal chain of 9.76 also paraesophageal lymph nodes on image 87/4 were present on previous CT imaging and are little changed in terms of size showing intense metabolic activity with a maximum SUV 10.38. Lymph nodes track along the RIGHT greater the LEFT mediastinum and largely spare the mediastinum anteriorly. Is Small nodule in the knee RIGHT upper lobe (image 71/4) no substantial increased metabolic activity but this measures only 7 mm with a maximum SUV of 1.0 Incidental CT findings: Signs of mild aortic atherosclerosis and coronary artery disease mild cardiomegaly. ABDOMEN/PELVIS: 12 mm lymph node adjacent to the RIGHT diaphragmatic crus shows a maximum SUV of 10.28 and shows no visible calcium and was not enlarged on previous imaging. Generalized heterogeneous uptake of FDG throughout the liver. No focal area with suspicious features. Small hiatal hernia with heterogeneous uptake in the stomach below the diaphragm as well maximum SUV 5.89 (image 101/4) Incidental CT findings: Liver without acute process. Gallbladder, pancreas, spleen and adrenal glands are unremarkable. Nephrolithiasis in the interpolar RIGHT kidney with a moderately large 10 mm calculus. No sign of hydronephrosis or perinephric stranding. Scattered small calculi in the LEFT kidney. Small LEFT renal cyst which does not require dedicated imaging follow-up on image 139 measuring approximately 1-1.2 cm. Urinary bladder is collapsed. Gastric and esophageal thickening as discussed. Signs of colonic diverticulosis. Normal appendix. No acute small bowel  process. Heterogeneous uptake about the prostate showing a maximum SUV of approximately 5.7. Moderate RIGHT fat containing inguinal hernia. SKELETON: No focal hypermetabolic activity to suggest skeletal metastasis. Incidental CT findings: none IMPRESSION: Signs of esophageal uptake and thickening in this patient with known esophageal neoplasm likely represents the site of disease. Heterogeneity and potential thickening near the gastric fundus in the setting of small hiatal hernia is nonspecific and potentially physiologic. However, in light of distal esophageal neoplasm would correlate with the recent endoscopic results to ensure no gastric abnormality. Diffuse mediastinal adenopathy, areas imaged on prior studies are similar and many areas with calcification, pattern of adenopathy and uptake would favor process such as sarcoidosis. Unfortunately this confounds assessment for metastatic disease. The most suspicious node currently is a juxta crural lymph node on the RIGHT adjacent to the celiac axis though again this could be related to underlying sarcoidosis. No definitive signs of metastatic disease with above limitations as outlined. Nephrolithiasis. Colonic diverticulosis. Heterogeneous uptake in the prostate is nonspecific. Consider urologic follow-up with PSA as warranted. Electronically Signed   By: Zetta Bills M.D.   On: 12/02/2021 12:57    Labs:  CBC: Recent Labs    11/13/21 2115 11/13/21 2122 12/12/21 1103  WBC 6.0  --  4.8  HGB 12.5* 12.6* 12.5*  HCT 37.2* 37.0* 36.9*  PLT 198  --  217    COAGS: No results for input(s): "INR", "APTT" in the last 8760 hours.  BMP: Recent Labs    11/13/21 2122 12/12/21 1103  NA 139 136  K 3.8 4.2  CL 103 102  CO2  --  29  GLUCOSE 103* 91  BUN 27* 24*  CALCIUM  --  12.6*  CREATININE 2.00* 1.26*  GFRNONAA  --  >60    LIVER FUNCTION TESTS: Recent Labs    12/12/21 1103  BILITOT 0.5  AST 15  ALT 15  ALKPHOS 57  PROT 7.4  ALBUMIN 4.7  TUMOR MARKERS: No results for input(s): "AFPTM", "CEA", "CA199", "CHROMGRNA" in the last 8760 hours.  Assessment and Plan: 61 y.o. male with recent diagnosis of esophageal CA who is in need of long term CVC for chemoradiation therapy.   IR was requested for [facility] placement by oncology team.  NPO since 7am VSS Not on AC/AP  Risks and benefits of image guided port-a-catheter placement was discussed with the patient including, but not limited to bleeding, infection, pneumothorax, or fibrin sheath development and need for additional procedures.  All of the patient's questions were answered, patient is agreeable to proceed. Consent signed and in chart.    Thank you for this interesting consult.  I greatly enjoyed meeting [employer] and look forward to participating in their care.  A copy of this report was sent to the requesting provider on this date.  Electronically Signed: Tera Mater, PA-C 12/23/2021, 11:59 AM   I spent a total of 30 Minutes   in face to face in clinical consultation, greater than 50% of which was counseling/coordinating care for [facility] placement.  This chart was dictated using voice recognition software.  Despite best efforts to proofread,  errors can occur which can change the documentation meaning.

## 2021-12-23 ENCOUNTER — Ambulatory Visit (HOSPITAL_COMMUNITY)
Admission: RE | Admit: 2021-12-23 | Discharge: 2021-12-23 | Disposition: A | Discharge status: Home / Self Care | Payer: No Typology Code available for payment source | Source: Ambulatory Visit | Attending: Physician Assistant | Admitting: Physician Assistant

## 2021-12-23 ENCOUNTER — Encounter (HOSPITAL_COMMUNITY): Payer: Self-pay

## 2021-12-23 ENCOUNTER — Encounter: Payer: Self-pay | Admitting: Hematology and Oncology

## 2021-12-23 DIAGNOSIS — Z87891 Personal history of nicotine dependence: Secondary | ICD-10-CM | POA: Insufficient documentation

## 2021-12-23 DIAGNOSIS — C159 Malignant neoplasm of esophagus, unspecified: Secondary | ICD-10-CM | POA: Diagnosis present

## 2021-12-23 DIAGNOSIS — R7303 Prediabetes: Secondary | ICD-10-CM | POA: Insufficient documentation

## 2021-12-23 DIAGNOSIS — Z510 Encounter for antineoplastic radiation therapy: Secondary | ICD-10-CM | POA: Diagnosis not present

## 2021-12-23 DIAGNOSIS — I10 Essential (primary) hypertension: Secondary | ICD-10-CM | POA: Diagnosis not present

## 2021-12-23 HISTORY — PX: IR IMAGING GUIDED PORT INSERTION: IMG5740

## 2021-12-23 LAB — GLUCOSE, CAPILLARY: Glucose-Capillary: 112 mg/dL — ABNORMAL HIGH (ref 70–99)

## 2021-12-23 MED ORDER — FENTANYL CITRATE (PF) 100 MCG/2ML IJ SOLN
INTRAMUSCULAR | Status: AC | PRN
  Administered 2021-12-23 (×4): 50 ug via INTRAVENOUS

## 2021-12-23 MED ORDER — SODIUM CHLORIDE 0.9 % IV SOLN: INTRAVENOUS | Status: DC

## 2021-12-23 MED ORDER — HEPARIN SOD (PORK) LOCK FLUSH 100 UNIT/ML IV SOLN
INTRAVENOUS | Status: AC | PRN
  Administered 2021-12-23: 500 [IU] via INTRAVENOUS

## 2021-12-23 MED ORDER — HEPARIN SOD (PORK) LOCK FLUSH 100 UNIT/ML IV SOLN
INTRAVENOUS | Status: AC
  Filled 2021-12-23: qty 5

## 2021-12-23 MED ORDER — MIDAZOLAM HCL 2 MG/2ML IJ SOLN
INTRAMUSCULAR | Status: AC | PRN
  Administered 2021-12-23 (×4): 1 mg via INTRAVENOUS

## 2021-12-23 MED ORDER — MIDAZOLAM HCL 2 MG/2ML IJ SOLN
INTRAMUSCULAR | Status: AC
  Filled 2021-12-23: qty 4

## 2021-12-23 MED ORDER — LIDOCAINE HCL 1 % IJ SOLN
INTRAMUSCULAR | Status: AC
  Filled 2021-12-23: qty 20

## 2021-12-23 MED ORDER — FENTANYL CITRATE (PF) 100 MCG/2ML IJ SOLN
INTRAMUSCULAR | Status: AC
  Filled 2021-12-23: qty 4

## 2021-12-23 MED ORDER — LIDOCAINE HCL 1 % IJ SOLN
INTRAMUSCULAR | Status: AC | PRN
  Administered 2021-12-23: 10 mL

## 2021-12-23 MED FILL — Dexamethasone Sodium Phosphate Inj 100 MG/10ML: INTRAMUSCULAR | Qty: 1 | Status: AC

## 2021-12-23 NOTE — Progress Notes (Signed)
Called pt to introduce myself as his Financial Resource Specialist and to discuss the [employer] grant.  Unfortunately there aren't any foundations offering copay assistance for his Dx and the type of ins he has.  I left a msg requesting he return my call if he's interested in applying for the grant. °

## 2021-12-23 NOTE — Discharge Instructions (Signed)

## 2021-12-23 NOTE — Procedures (Signed)
Interventional Radiology Procedure Note  Procedure: Single Lumen Power Port Placement    Access:  Right IJ vein.  Findings: Catheter tip positioned at SVC/RA junction. Port is ready for immediate use.   Complications: None  EBL: < 10 mL  Recommendations:  - Ok to shower in 24 hours - Do not submerge for 7 days - Routine line care   Hezikiah Retzloff T. Trenace Coughlin, M.D Pager:  [phone]   

## 2021-12-26 ENCOUNTER — Other Ambulatory Visit: Payer: Self-pay

## 2021-12-26 ENCOUNTER — Ambulatory Visit
Admission: RE | Admit: 2021-12-26 | Discharge: 2021-12-26 | Disposition: A | Discharge status: Home / Self Care | Payer: No Typology Code available for payment source | Source: Ambulatory Visit | Attending: Radiation Oncology | Admitting: Radiation Oncology

## 2021-12-26 ENCOUNTER — Other Ambulatory Visit: Payer: Self-pay | Admitting: Hematology and Oncology

## 2021-12-26 ENCOUNTER — Inpatient Hospital Stay: Payer: No Typology Code available for payment source

## 2021-12-26 ENCOUNTER — Inpatient Hospital Stay (HOSPITAL_BASED_OUTPATIENT_CLINIC_OR_DEPARTMENT_OTHER): Payer: No Typology Code available for payment source | Admitting: Hematology and Oncology

## 2021-12-26 VITALS — BP 145/81 | HR 67 | Temp 97.7°F | Resp 19 | Ht 72.0 in | Wt 345.2 lb

## 2021-12-26 VITALS — BP 149/82 | HR 61 | Temp 97.7°F | Resp 22

## 2021-12-26 DIAGNOSIS — Z510 Encounter for antineoplastic radiation therapy: Secondary | ICD-10-CM | POA: Diagnosis not present

## 2021-12-26 DIAGNOSIS — C159 Malignant neoplasm of esophagus, unspecified: Secondary | ICD-10-CM

## 2021-12-26 DIAGNOSIS — Z95828 Presence of other vascular implants and grafts: Secondary | ICD-10-CM | POA: Diagnosis not present

## 2021-12-26 DIAGNOSIS — C155 Malignant neoplasm of lower third of esophagus: Secondary | ICD-10-CM | POA: Diagnosis not present

## 2021-12-26 LAB — RAD ONC ARIA SESSION SUMMARY
Course Elapsed Days: 0
Plan Fractions Treated to Date: 1
Plan Prescribed Dose Per Fraction: 1.8 Gy
Plan Total Fractions Prescribed: 25
Plan Total Prescribed Dose: 45 Gy
Reference Point Dosage Given to Date: 1.8 Gy
Reference Point Session Dosage Given: 1.8 Gy
Session Number: 1

## 2021-12-26 LAB — CMP (CANCER CENTER ONLY)
ALT: 12 U/L (ref 0–44)
AST: 11 U/L — ABNORMAL LOW (ref 15–41)
Albumin: 4 g/dL (ref 3.5–5.0)
Alkaline Phosphatase: 58 U/L (ref 38–126)
Anion gap: 3 — ABNORMAL LOW (ref 5–15)
BUN: 23 mg/dL (ref 8–23)
CO2: 29 mmol/L (ref 22–32)
Calcium: 11.6 mg/dL — ABNORMAL HIGH (ref 8.9–10.3)
Chloride: 105 mmol/L (ref 98–111)
Creatinine: 1.25 mg/dL — ABNORMAL HIGH (ref 0.61–1.24)
GFR, Estimated: >60 mL/min (ref >60)
Glucose, Bld: 98 mg/dL (ref 70–99)
Potassium: 4.4 mmol/L (ref 3.5–5.1)
Sodium: 137 mmol/L (ref 135–145)
Total Bilirubin: 0.4 mg/dL (ref 0.3–1.2)
Total Protein: 6.7 g/dL (ref 6.5–8.1)

## 2021-12-26 LAB — CBC WITH DIFFERENTIAL (CANCER CENTER ONLY)
Abs Immature Granulocytes: 0.01 10*3/uL (ref 0.00–0.07)
Basophils Absolute: 0 10*3/uL (ref 0.0–0.1)
Basophils Relative: 1 %
Eosinophils Absolute: 0.1 10*3/uL (ref 0.0–0.5)
Eosinophils Relative: 3 %
HCT: 34.1 % — ABNORMAL LOW (ref 39.0–52.0)
Hemoglobin: 11.6 g/dL — ABNORMAL LOW (ref 13.0–17.0)
Immature Granulocytes: 0 %
Lymphocytes Relative: 13 %
Lymphs Abs: 0.6 10*3/uL — ABNORMAL LOW (ref 0.7–4.0)
MCH: 29.7 pg (ref 26.0–34.0)
MCHC: 34 g/dL (ref 30.0–36.0)
MCV: 87.2 fL (ref 80.0–100.0)
Monocytes Absolute: 0.4 10*3/uL (ref 0.1–1.0)
Monocytes Relative: 10 %
Neutro Abs: 3.2 10*3/uL (ref 1.7–7.7)
Neutrophils Relative %: 73 %
Platelet Count: 188 10*3/uL (ref 150–400)
RBC: 3.91 MIL/uL — ABNORMAL LOW (ref 4.22–5.81)
RDW: 14 % (ref 11.5–15.5)
WBC Count: 4.3 10*3/uL (ref 4.0–10.5)
nRBC: 0 % (ref 0.0–0.2)

## 2021-12-26 MED ORDER — SODIUM CHLORIDE 0.9 % IV SOLN: Freq: Once | INTRAVENOUS | Status: AC

## 2021-12-26 MED ORDER — SODIUM CHLORIDE 0.9 % IV SOLN
10.0000 mg | Freq: Once | INTRAVENOUS | Status: AC
  Administered 2021-12-26: 10 mg via INTRAVENOUS
  Filled 2021-12-26: qty 10

## 2021-12-26 MED ORDER — SODIUM CHLORIDE 0.9% FLUSH
10.0000 mL | Freq: Once | INTRAVENOUS | Status: AC
  Administered 2021-12-26: 10 mL

## 2021-12-26 MED ORDER — SODIUM CHLORIDE 0.9% FLUSH
10.0000 mL | INTRAVENOUS | Status: DC | PRN
  Administered 2021-12-26: 10 mL

## 2021-12-26 MED ORDER — SODIUM CHLORIDE 0.9 % IV SOLN
300.0000 mg | Freq: Once | INTRAVENOUS | Status: AC
  Administered 2021-12-26: 300 mg via INTRAVENOUS
  Filled 2021-12-26: qty 30

## 2021-12-26 MED ORDER — HEPARIN SOD (PORK) LOCK FLUSH 100 UNIT/ML IV SOLN
500.0000 [IU] | Freq: Once | INTRAVENOUS | Status: AC | PRN
  Administered 2021-12-26: 500 [IU]

## 2021-12-26 MED ORDER — PALONOSETRON HCL INJECTION 0.25 MG/5ML
0.2500 mg | Freq: Once | INTRAVENOUS | Status: AC
  Administered 2021-12-26: 0.25 mg via INTRAVENOUS
  Filled 2021-12-26: qty 5

## 2021-12-26 MED ORDER — FAMOTIDINE IN NACL 20-0.9 MG/50ML-% IV SOLN
20.0000 mg | Freq: Once | INTRAVENOUS | Status: AC
  Administered 2021-12-26: 20 mg via INTRAVENOUS
  Filled 2021-12-26: qty 50

## 2021-12-26 MED ORDER — SODIUM CHLORIDE 0.9 % IV SOLN
50.0000 mg/m2 | Freq: Once | INTRAVENOUS | Status: AC
  Administered 2021-12-26: 144 mg via INTRAVENOUS
  Filled 2021-12-26: qty 24

## 2021-12-26 MED ORDER — DIPHENHYDRAMINE HCL 50 MG/ML IJ SOLN
50.0000 mg | Freq: Once | INTRAMUSCULAR | Status: AC
  Administered 2021-12-26: 50 mg via INTRAVENOUS
  Filled 2021-12-26: qty 1

## 2021-12-26 NOTE — Progress Notes (Signed)
Nutrition Assessment   Reason for Assessment:  New esophageal cancer   ASSESSMENT:  61 year old male with esophageal cancer.  Past medical history of pre-DM, HTN.  Patient starting concurrent chemotherapy and radiation today.    Met with patient and wife during infusion.  Patient reports that wife has been pureeing foods for him to liquid consistency since food impaction episode.  Usually has premier protein shake or similar for breakfast.  Few hours later will puree [employer] Sausage with eggs, avocado, salsa, sour cream.  In the afternoon will drink another shake.  Dinner maybe soup.  Makes homemade bone broth or broth type soups and purees them.  Makes smoothies (fruits, spinach/kale.  Patient tries to eat low sugar and sodium.  Says that he pureed whopper burger from [facility] and added more broth to thin it down.    Wife with questions regarding foods to eat for anemia    Medications: vit C, vit D, Vit E, zofran, compazine, actos   Labs: reviewed   Anthropometrics:   Height: 72 inches Weight: 345 lb today 365 lb on 4/4 BMI: 47  5% weight loss in the last 2 months, concerning   NUTRITION DIAGNOSIS: Unintentional weight loss related to esophageal cancer as evidenced by 5% weight loss in 2 months and changing consistencies of foods   INTERVENTION:  Continue puree foods for ease of swallowing.  Encouraged protein source mixed in smoothies (ie dairy product, peanut butter, etc) Continue oral nutrition supplement   MONITORING, EVALUATION, GOAL: weight trends, intake   Next Visit: Monday, June 26 during infusion  Mirian Casco B. Zenia Resides, [place], [place] Registered Dietitian [phone]

## 2021-12-26 NOTE — Progress Notes (Signed)
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 Stage IIB (cT2,cN0, cM0) 11/13/2021: Presented to the emergency department complaining of dysphagia and food impaction. Underwent EGD with Dr. Hilarie Fredrickson. Findings revealed medium sized fungating mass in the lower third of th esophagus, 35 cm from the incisors and extending into the GE junction.   It appears to have arisen in the background of Barrett's esophagus, the mass was nonobstructing and partially circumferential. Pathology confirmed moderately to poorly differentiated adenocarcinoma.  11/30/2021: PET scan showed  hypermetabolic activity in the distal esophagus with an SUV of 7.7 with thickening measuring up to 15 mm and spanning just to above the GE junction.  Diffuse mediastinal adenopathy showed dense calcification in all nearly symmetric findings.  12/06/2021: Established care with Radiation Oncology with Dr. Kyung Rudd and Shona Simpson PA-C 12/12/2021: Establish care with Medication Oncology with Dr. Narda Rutherford and Dede Query PA-C Cycle 1 Day 1 of Carbo/Paclitaxel with radiation.   Interval History:  Keith Moreno 61 y.o. male with medical history significant for localized esophageal adenocarcinoma who presents for a follow up visit. The patient's last visit was on 12/12/2021 at which time he established care. In the interim since the last visit he has undergone port placement and completed EUS and biopsy of thoracic lymph nodes, fortunately which showed no evidence of malignancy.  On exam today Keith Moreno is accompanied by his wife.  He reports  that he feels well today.  He notes that his port site is a little bit sore but otherwise the port placement went quite well.  He notes that he also tolerated his EGD and EUS quite well.  He notes that he continues a liquid/soft food diet consisting of mostly "runny mashed potatoes".  He notes that he is eating well and his weight has been stable at 345 pounds.  He notes he is not having any increased pain or any further difficulties.  He otherwise denies any fevers, chills, sweats, nausea, vomiting or diarrhea.  A full 10 point ROS was otherwise negative.  The bulk of our discussion focused on assuring he had everything necessary to proceed with chemotherapy today.  He had the opportunity to ask any questions and concerns prior to the start of treatment.  All issues were addressed and the patient has everything necessary to start treatment today.  MEDICAL HISTORY:  Past Medical History:  Diagnosis Date   Anxiety    COVID 2022   mild case   History of kidney stones    Hypertension    Kidney infection    at age 64   Pneumonia    9 years ago   Pre-diabetes    PTSD (post-traumatic stress disorder)     SURGICAL HISTORY: Past Surgical History:  Procedure Laterality Date   BIOPSY  11/13/2021   Procedure: BIOPSY;  Surgeon: Jerene Bears, MD;  Location: [place];  Service: Gastroenterology;;   BIOPSY  12/14/2021   Procedure: BIOPSY;  Surgeon: Irving Copas., MD;  Location: [facility] ENDOSCOPY;  Service: Gastroenterology;;   BRONCHIAL BRUSHINGS  12/14/2021   Procedure: BRONCHIAL BRUSHINGS;  Surgeon: Collene Gobble, MD;  Location: [facility] ENDOSCOPY;  Service: Cardiopulmonary;;  BRONCHIAL NEEDLE ASPIRATION BIOPSY  12/14/2021   Procedure: BRONCHIAL NEEDLE ASPIRATION BIOPSIES;  Surgeon: Collene Gobble, MD;  Location: [facility] ENDOSCOPY;  Service: Cardiopulmonary;;   ESOPHAGOGASTRODUODENOSCOPY N/A 11/13/2021   Procedure: ESOPHAGOGASTRODUODENOSCOPY (EGD);  Surgeon: Jerene Bears, MD;  Location: [facility] ENDOSCOPY;   Service: Gastroenterology;  Laterality: N/A;   ESOPHAGOGASTRODUODENOSCOPY (EGD) WITH PROPOFOL N/A 12/14/2021   Procedure: ESOPHAGOGASTRODUODENOSCOPY (EGD) WITH PROPOFOL;  Surgeon: Rush Landmark Telford Nab., MD;  Location: [place];  Service: Gastroenterology;  Laterality: N/A;   EUS N/A 12/14/2021   Procedure: UPPER ENDOSCOPIC ULTRASOUND (EUS) LINEAR;  Surgeon: Irving Copas., MD;  Location: [place];  Service: Gastroenterology;  Laterality: N/A;   FINE NEEDLE ASPIRATION  12/14/2021   Procedure: FINE NEEDLE ASPIRATION (FNA) LINEAR;  Surgeon: Irving Copas., MD;  Location: [place];  Service: Gastroenterology;;   FOREIGN BODY REMOVAL  11/13/2021   Procedure: FOREIGN BODY REMOVAL;  Surgeon: Jerene Bears, MD;  Location: [place];  Service: Gastroenterology;;   HUMERUS FRACTURE SURGERY Right    IR IMAGING GUIDED PORT INSERTION  12/23/2021   TONSILLECTOMY AND ADENOIDECTOMY     VIDEO BRONCHOSCOPY WITH ENDOBRONCHIAL ULTRASOUND Bilateral 12/14/2021   Procedure: VIDEO BRONCHOSCOPY WITH ENDOBRONCHIAL ULTRASOUND;  Surgeon: Collene Gobble, MD;  Location: [place];  Service: Cardiopulmonary;  Laterality: Bilateral;    SOCIAL HISTORY: Social History   Socioeconomic History   Marital status: Single    Spouse name: Not on file   Number of children: Not on file   Years of education: Not on file   Highest education level: Not on file  Occupational History   Not on file  Tobacco Use   Smoking status: Former    Types: Cigars, Cigarettes, Pipe    Quit date: 75    Years since quitting: 40.4   Smokeless tobacco: Former    Types: Chew    Quit date: 1983  Vaping Use   Vaping Use: Never used  Substance and Sexual Activity   Alcohol use: Yes    Comment: Occasional   Drug use: No   Sexual activity: Not on file  Other Topics Concern   Not on file  Social History Narrative   Not on file   Social Determinants of Health   Financial Resource Strain: Not on file  Food  Insecurity: Not on file  Transportation Needs: Not on file  Physical Activity: Not on file  Stress: Not on file  Social Connections: Not on file  Intimate Partner Violence: Not on file    FAMILY HISTORY: Family History  Problem Relation Age of Onset   Hypertension Mother    Diabetes Mother    Bipolar disorder Mother    Hypertension Father    Kidney cancer Sister    Colon cancer Paternal Uncle    Cancer Cousin    Bladder Cancer Cousin    Cancer Cousin     ALLERGIES:  is allergic to procaine, chocolate, and other.  MEDICATIONS:  Current Outpatient Medications  Medication Sig Dispense Refill   Alogliptin Benzoate 25 MG TABS Take 12.5 mg by mouth at bedtime.     amLODipine (NORVASC) 10 MG tablet Take 1 tablet by mouth at bedtime.     Ascorbic Acid (VITAMIN C) 1000 MG tablet Take 2,000 mg by mouth every morning.     atenolol (TENORMIN) 25 MG tablet Take 12.5 mg by mouth at bedtime.     Cholecalciferol (VITAMIN D3) 50 MCG (2000 UT) capsule Take 20,000 Units by mouth at bedtime. 10 gel caps  CINNAMON PO Take 4,000 mg by mouth 2 (two) times daily. 1000 mg each     diphenhydramine-acetaminophen (TYLENOL PM) 25-500 MG TABS tablet Take 2 tablets by mouth at bedtime. 14 tablet    Krill Oil 350 MG CAPS Take 1,050 mg by mouth at bedtime.     lidocaine-prilocaine (EMLA) cream Apply 1 application. topically as needed. 30 g 0   lisinopril (PRINIVIL,ZESTRIL) 40 MG tablet Take 40 mg by mouth at bedtime.     MAGNESIUM CITRATE PO Take 100 mg by mouth at bedtime.     Melatonin 10 MG TABS Take 40 mg by mouth at bedtime as needed (Sleep).     olopatadine (PATANOL) 0.1 % ophthalmic solution Place 1 drop into both eyes 2 (two) times daily.     ondansetron (ZOFRAN-ODT) 8 MG disintegrating tablet Take 1 tablet (8 mg total) by mouth every 8 (eight) hours as needed for nausea or vomiting. 90 tablet 3   pioglitazone (ACTOS) 30 MG tablet Take 30 mg by mouth every morning.     Potassium 99 MG TABS Take  198 mg by mouth daily.     prochlorperazine (COMPAZINE) 10 MG tablet Take 1 tablet (10 mg total) by mouth every 6 (six) hours as needed for nausea or vomiting. 90 tablet 3   terbinafine (LAMISIL) 250 MG tablet Take 250 mg by mouth every morning.     vitamin E 180 MG (400 UNITS) capsule Take 1,200 Units by mouth 2 (two) times daily. Gel caps     No current facility-administered medications for this visit.   Facility-Administered Medications Ordered in Other Visits  Medication Dose Route Frequency Provider Last Rate Last Admin   sodium chloride flush (NS) 0.9 % injection 10 mL  10 mL Intracatheter PRN Orson Slick, MD   10 mL at 12/26/21 1537    REVIEW OF SYSTEMS:   Constitutional: ( - ) fevers, ( - )  chills , ( - ) night sweats Eyes: ( - ) blurriness of vision, ( - ) double vision, ( - ) watery eyes Ears, nose, mouth, throat, and face: ( - ) mucositis, ( - ) sore throat Respiratory: ( - ) cough, ( - ) dyspnea, ( - ) wheezes Cardiovascular: ( - ) palpitation, ( - ) chest discomfort, ( - ) lower extremity swelling Gastrointestinal:  ( - ) nausea, ( - ) heartburn, ( - ) change in bowel habits Skin: ( - ) abnormal skin rashes Lymphatics: ( - ) new lymphadenopathy, ( - ) easy bruising Neurological: ( - ) numbness, ( - ) tingling, ( - ) new weaknesses Behavioral/Psych: ( - ) mood change, ( - ) new changes  All other systems were reviewed with the patient and are negative.  PHYSICAL EXAMINATION: ECOG PERFORMANCE STATUS: 1 - Symptomatic but completely ambulatory  Vitals:   12/26/21 1028  BP: (!) 145/81  Pulse: 67  Resp: 19  Temp: 97.7 F (36.5 C)  SpO2: 98%   Filed Weights   12/26/21 1028  Weight: (!) 345 lb 3.2 oz (156.6 kg)    GENERAL: Well-appearing elderly Caucasian male, alert, no distress and comfortable SKIN: skin color, texture, turgor are normal, no rashes or significant lesions EYES: conjunctiva are pink and non-injected, sclera clear LUNGS: clear to auscultation  and percussion with normal breathing effort HEART: regular rate & rhythm and no murmurs and no lower extremity edema Musculoskeletal: no cyanosis of digits and no clubbing  PSYCH: alert & oriented x 3, fluent speech NEURO:  no focal motor/sensory deficits  LABORATORY DATA:  I have reviewed the data as listed    Latest Ref Rng & Units 12/26/2021   10:13 AM 12/12/2021   11:03 AM 11/13/2021    9:22 PM  CBC  WBC 4.0 - 10.5 K/uL 4.3  4.8    Hemoglobin 13.0 - 17.0 g/dL 11.6  12.5  12.6   Hematocrit 39.0 - 52.0 % 34.1  36.9  37.0   Platelets 150 - 400 K/uL 188  217         Latest Ref Rng & Units 12/26/2021   10:13 AM 12/12/2021   11:03 AM 11/13/2021    9:22 PM  CMP  Glucose 70 - 99 mg/dL 98  91  103   BUN 8 - 23 mg/dL _0 Creatinine 0.61 - 1.24 mg/dL 1.25  1.26  2.00   Sodium 135 - 145 mmol/L 137  136  139   Potassium 3.5 - 5.1 mmol/L 4.4  4.2  3.8   Chloride 98 - 111 mmol/L 105  102  103   CO2 22 - 32 mmol/L 29  29    Calcium 8.9 - 10.3 mg/dL 11.6  12.6    Total Protein 6.5 - 8.1 g/dL 6.7  7.4    Total Bilirubin 0.3 - 1.2 mg/dL 0.4  0.5    Alkaline Phos 38 - 126 U/L 58  57    AST 15 - 41 U/L 11  15    ALT 0 - 44 U/L 12  15      RADIOGRAPHIC STUDIES: IR IMAGING GUIDED PORT INSERTION  Result Date: 12/23/2021 CLINICAL DATA:  Esophageal carcinoma and need for porta cath for chemotherapy. EXAM: IMPLANTED PORT A CATH PLACEMENT WITH ULTRASOUND AND FLUOROSCOPIC GUIDANCE ANESTHESIA/SEDATION: Moderate (conscious) sedation was employed during this procedure. A total of Versed 4.0 mg and Fentanyl 200 mcg was administered intravenously by radiology nursing. Moderate Sedation Time: 37 minutes. The patient's level of consciousness and vital signs were monitored continuously by radiology nursing throughout the procedure under my direct supervision. FLUOROSCOPY: 9.0 mGy. PROCEDURE: The procedure, risks, benefits, and alternatives were explained to the patient. Questions regarding the procedure  were encouraged and answered. The patient understands and consents to the procedure. A time-out was performed prior to initiating the procedure. Ultrasound was utilized to confirm patency of the right internal jugular vein. A permanent ultrasound image was recorded. The right neck and chest were prepped with chlorhexidine in a sterile fashion, and a sterile drape was applied covering the operative field. Maximum barrier sterile technique with sterile gowns and gloves were used for the procedure. Local anesthesia was provided with 1% lidocaine. After creating a small venotomy incision, a 21 gauge needle was advanced into the right internal jugular vein under direct, real-time ultrasound guidance. Ultrasound image documentation was performed. After securing guidewire access, an 8 Fr dilator was placed. A J-wire was kinked to measure appropriate catheter length. A subcutaneous port pocket was then created along the upper chest wall utilizing sharp and blunt dissection. Portable cautery was utilized. The pocket was irrigated with sterile saline. A single lumen power injectable port was chosen for placement. The 8 Fr catheter was tunneled from the port pocket site to the venotomy incision. The port was placed in the pocket. External catheter was trimmed to appropriate length based on guidewire measurement. At the venotomy, an 8 Fr peel-away sheath was placed over a guidewire. The catheter was then placed through the sheath and the  sheath removed. Final catheter positioning was confirmed and documented with a fluoroscopic spot image. The port was accessed with a needle and aspirated and flushed with heparinized saline. The access needle was removed. The venotomy and port pocket incisions were closed with subcutaneous 3-0 Monocryl and subcuticular 4-0 Vicryl. Dermabond was applied to both incisions. COMPLICATIONS: COMPLICATIONS None FINDINGS: After catheter placement, the tip lies at the cavo-atrial junction. The catheter  aspirates normally and is ready for immediate use. IMPRESSION: Placement of single lumen port a cath via right internal jugular vein. The catheter tip lies at the cavo-atrial junction. A power injectable port a cath was placed and is ready for immediate use. Electronically Signed   By: Aletta Edouard M.D.   On: 12/23/2021 15:47   NM PET Image Initial (PI) Skull Base To Thigh (F-18 FDG)  Result Date: 12/02/2021 CLINICAL DATA:  Initial treatment strategy for esophageal adenocarcinoma in a 61 year old male. EXAM: NUCLEAR MEDICINE PET SKULL BASE TO THIGH TECHNIQUE: 15.5 mCi F-18 FDG was injected intravenously. Full-ring PET imaging was performed from the skull base to thigh after the radiotracer. CT data was obtained and used for attenuation correction and anatomic localization. Fasting blood glucose: 111 mg/dl COMPARISON:  Imaging from January 08, 2015. FINDINGS: Mediastinal blood pool activity: SUV max 3.17 Liver activity: SUV max NA NECK: Scattered small lymph nodes in the LEFT neck at level III and IV (image 43/4) maximum SUV 3.3 measuring approximately 7-8 mm. No additional areas in the neck of concern. Incidental CT findings: None CHEST: Distal esophageal uptake with a maximum SUV 7.7 (image 90/4) this is in an area of esophageal thickening at the distal esophagus measuring up to 15 mm with some eccentric thickening in this location. Metabolic activity spans a segment of the esophagus just above the GE junction. Diffuse mediastinal adenopathy many of these lymph nodes show dense calcification all nearly symmetric with respect to metabolic activity and many present on imaged portions of the chest included on the abdominal CT from January 08, 2015, for instance on image 67/4 maximum SUV of the LEFT hilum 8.22 and maximum SUV along RIGHT paratracheal chain of 9.76 also paraesophageal lymph nodes on image 87/4 were present on previous CT imaging and are little changed in terms of size showing intense metabolic activity  with a maximum SUV 10.38. Lymph nodes track along the RIGHT greater the LEFT mediastinum and largely spare the mediastinum anteriorly. Is Small nodule in the knee RIGHT upper lobe (image 71/4) no substantial increased metabolic activity but this measures only 7 mm with a maximum SUV of 1.0 Incidental CT findings: Signs of mild aortic atherosclerosis and coronary artery disease mild cardiomegaly. ABDOMEN/PELVIS: 12 mm lymph node adjacent to the RIGHT diaphragmatic crus shows a maximum SUV of 10.28 and shows no visible calcium and was not enlarged on previous imaging. Generalized heterogeneous uptake of FDG throughout the liver. No focal area with suspicious features. Small hiatal hernia with heterogeneous uptake in the stomach below the diaphragm as well maximum SUV 5.89 (image 101/4) Incidental CT findings: Liver without acute process. Gallbladder, pancreas, spleen and adrenal glands are unremarkable. Nephrolithiasis in the interpolar RIGHT kidney with a moderately large 10 mm calculus. No sign of hydronephrosis or perinephric stranding. Scattered small calculi in the LEFT kidney. Small LEFT renal cyst which does not require dedicated imaging follow-up on image 139 measuring approximately 1-1.2 cm. Urinary bladder is collapsed. Gastric and esophageal thickening as discussed. Signs of colonic diverticulosis. Normal appendix. No acute small bowel process. Heterogeneous  uptake about the prostate showing a maximum SUV of approximately 5.7. Moderate RIGHT fat containing inguinal hernia. SKELETON: No focal hypermetabolic activity to suggest skeletal metastasis. Incidental CT findings: none IMPRESSION: Signs of esophageal uptake and thickening in this patient with known esophageal neoplasm likely represents the site of disease. Heterogeneity and potential thickening near the gastric fundus in the setting of small hiatal hernia is nonspecific and potentially physiologic. However, in light of distal esophageal neoplasm would  correlate with the recent endoscopic results to ensure no gastric abnormality. Diffuse mediastinal adenopathy, areas imaged on prior studies are similar and many areas with calcification, pattern of adenopathy and uptake would favor process such as sarcoidosis. Unfortunately this confounds assessment for metastatic disease. The most suspicious node currently is a juxta crural lymph node on the RIGHT adjacent to the celiac axis though again this could be related to underlying sarcoidosis. No definitive signs of metastatic disease with above limitations as outlined. Nephrolithiasis. Colonic diverticulosis. Heterogeneous uptake in the prostate is nonspecific. Consider urologic follow-up with PSA as warranted. Electronically Signed   By: Zetta Bills M.D.   On: 12/02/2021 12:57    ASSESSMENT & PLAN Keith Moreno 61 y.o. male with medical history significant for localized esophageal adenocarcinoma who presents for a follow up visit.   After review the labs, review the records, discussion with the patient the findings are most consistent with a localized adenocarcinoma of the esophagus.  At this time our plan will consist of neoadjuvant chemoradiation followed by consideration of surgical resection.  The patient voices understanding of this plan moving forward.  We will plan to proceed with carbo paclitaxel weekly during his chemotherapy treatment.  PEG tube can be placed if the patient has marked difficulty swallowing.  Otherwise his labs are stable and he is a good candidate for treatment moving forward.  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 Stage IIB (cT2,cN0, cM0) --completed EUS and EBUS to finalize staging on 12/14/2021 --biopsy confirms moderate-poorly differentiated adenocarcinoma of the esophagus.  -- Port placed, chemotherapy education completed -- We will plan for surgical evaluation after the patient has completed neoadjuvant treatment. Plan:  --today is  Cycle 1 Day 1 of Chemoradiation with Carboplatin/Paclitaxel --continue with weekly chemotherapy while receiving radiation -- RTC in 2 weeks for continued monitoring.   #Supportive Care -- chemotherapy education completed -- port placed -- zofran 19m q8H PRN and compazine 126mPO q6H for nausea -- EMLA cream for port -- no pain medication required at this time.      No orders of the defined types were placed in this encounter.   All questions were answered. The patient knows to call the clinic with any problems, questions or concerns.  A total of more than 30 minutes were spent on this encounter with face-to-face time and non-face-to-face time, including preparing to see the patient, ordering tests and/or medications, counseling the patient and coordination of care as outlined above.   JoLedell PeoplesMD Department of Hematology/Oncology CoMarlettet WeBoulder Community Hospitalhone: [phone]: [phone]: [email or web address]  12/26/2021 5:02 PM

## 2021-12-26 NOTE — Patient Instructions (Addendum)
[facility]  Discharge Instructions: Thank you for choosing [facility] to provide your oncology and hematology care.   If you have a lab appointment with the Cancer Center, please go directly to the Cancer Center and check in at the registration area.   Wear comfortable clothing and clothing appropriate for easy access to any Portacath or PICC line.   We strive to give you quality time with your provider. You may need to reschedule your appointment if you arrive late (15 or more minutes).  Arriving late affects you and other patients whose appointments are after yours.  Also, if you miss three or more appointments without notifying the office, you may be dismissed from the clinic at the provider's discretion.      For prescription refill requests, have your pharmacy contact our office and allow 72 hours for refills to be completed.    Today you received the following chemotherapy and/or immunotherapy agents: Paclitaxel (Taxol) and Carboplatin.   To help prevent nausea and vomiting after your treatment, we encourage you to take your nausea medication as directed.  BELOW ARE SYMPTOMS THAT SHOULD BE REPORTED IMMEDIATELY: *FEVER GREATER THAN 100.4 F (38 C) OR HIGHER *CHILLS OR SWEATING *NAUSEA AND VOMITING THAT IS NOT CONTROLLED WITH YOUR NAUSEA MEDICATION *UNUSUAL SHORTNESS OF BREATH *UNUSUAL BRUISING OR BLEEDING *URINARY PROBLEMS (pain or burning when urinating, or frequent urination) *BOWEL PROBLEMS (unusual diarrhea, constipation, pain near the anus) TENDERNESS IN MOUTH AND THROAT WITH OR WITHOUT PRESENCE OF ULCERS (sore throat, sores in mouth, or a toothache) UNUSUAL RASH, SWELLING OR PAIN  UNUSUAL VAGINAL DISCHARGE OR ITCHING   Items with * indicate a potential emergency and should be followed up as soon as possible or go to the Emergency Department if any problems should occur.  Please show the CHEMOTHERAPY ALERT CARD or IMMUNOTHERAPY  ALERT CARD at check-in to the Emergency Department and triage nurse.  Should you have questions after your visit or need to cancel or reschedule your appointment, please contact [facility]  Dept: [phone]  and follow the prompts.  Office hours are 8:00 a.m. to 4:30 p.m. Monday - Friday. Please note that voicemails left after 4:00 p.m. may not be returned until the following business day.  We are closed weekends and major holidays. You have access to a nurse at all times for urgent questions. Please call the main number to the clinic Dept: [phone] and follow the prompts.   For any non-urgent questions, you may also contact your provider using MyChart. We now offer e-Visits for anyone 18 and older to request care online for non-urgent symptoms. For details visit [email or web address].   Also download the MyChart app! Go to the app store, search "MyChart", open the app, select , and log in with your MyChart username and password.  Masks are optional in the cancer centers. If you would like for your care team to wear a mask while they are taking care of you, please let them know. For doctor visits, patients may have with them one support person who is at least 61 years old. At this time, visitors are not allowed in the infusion area. Paclitaxel injection What is this medication? PACLITAXEL (PAK li TAX el) is a chemotherapy drug. It targets fast dividing cells, like cancer cells, and causes these cells to die. This medicine is used to treat ovarian cancer, breast cancer, lung cancer, Kaposi's sarcoma, and other cancers. This medicine may be used   for other purposes; ask your health care provider or pharmacist if you have questions. COMMON BRAND NAME(S): Onxol, Taxol What should I tell my care team before I take this medication? They need to know if you have any of these conditions: history of irregular heartbeat liver disease low blood counts, like  low white cell, platelet, or red cell counts lung or breathing disease, like asthma tingling of the fingers or toes, or other nerve disorder an unusual or allergic reaction to paclitaxel, alcohol, polyoxyethylated castor oil, other chemotherapy, other medicines, foods, dyes, or preservatives pregnant or trying to get pregnant breast-feeding How should I use this medication? This drug is given as an infusion into a vein. It is administered in a hospital or clinic by a specially trained health care professional. Talk to your pediatrician regarding the use of this medicine in children. Special care may be needed. Overdosage: If you think you have taken too much of this medicine contact a poison control center or emergency room at once. NOTE: This medicine is only for you. Do not share this medicine with others. What if I miss a dose? It is important not to miss your dose. Call your doctor or health care professional if you are unable to keep an appointment. What may interact with this medication? Do not take this medicine with any of the following medications: live virus vaccines This medicine may also interact with the following medications: antiviral medicines for hepatitis, HIV or AIDS certain antibiotics like erythromycin and clarithromycin certain medicines for fungal infections like ketoconazole and itraconazole certain medicines for seizures like carbamazepine, phenobarbital, phenytoin gemfibrozil nefazodone rifampin St. John's wort This list may not describe all possible interactions. Give your health care provider a list of all the medicines, herbs, non-prescription drugs, or dietary supplements you use. Also tell them if you smoke, drink alcohol, or use illegal drugs. Some items may interact with your medicine. What should I watch for while using this medication? Your condition will be monitored carefully while you are receiving this medicine. You will need important blood work done  while you are taking this medicine. This medicine can cause serious allergic reactions. To reduce your risk you will need to take other medicine(s) before treatment with this medicine. If you experience allergic reactions like skin rash, itching or hives, swelling of the face, lips, or tongue, tell your doctor or health care professional right away. In some cases, you may be given additional medicines to help with side effects. Follow all directions for their use. This drug may make you feel generally unwell. This is not uncommon, as chemotherapy can affect healthy cells as well as cancer cells. Report any side effects. Continue your course of treatment even though you feel ill unless your doctor tells you to stop. Call your doctor or health care professional for advice if you get a fever, chills or sore throat, or other symptoms of a cold or flu. Do not treat yourself. This drug decreases your body's ability to fight infections. Try to avoid being around people who are sick. This medicine may increase your risk to bruise or bleed. Call your doctor or health care professional if you notice any unusual bleeding. Be careful brushing and flossing your teeth or using a toothpick because you may get an infection or bleed more easily. If you have any dental work done, tell your dentist you are receiving this medicine. Avoid taking products that contain aspirin, acetaminophen, ibuprofen, naproxen, or ketoprofen unless instructed by your doctor. These   medicines may hide a fever. Do not become pregnant while taking this medicine. Women should inform their doctor if they wish to become pregnant or think they might be pregnant. There is a potential for serious side effects to an unborn child. Talk to your health care professional or pharmacist for more information. Do not breast-feed an infant while taking this medicine. Men are advised not to father a child while receiving this medicine. This product may contain  alcohol. Ask your pharmacist or healthcare provider if this medicine contains alcohol. Be sure to tell all healthcare providers you are taking this medicine. Certain medicines, like metronidazole and disulfiram, can cause an unpleasant reaction when taken with alcohol. The reaction includes flushing, headache, nausea, vomiting, sweating, and increased thirst. The reaction can last from 30 minutes to several hours. What side effects may I notice from receiving this medication? Side effects that you should report to your doctor or health care professional as soon as possible: allergic reactions like skin rash, itching or hives, swelling of the face, lips, or tongue breathing problems changes in vision fast, irregular heartbeat high or low blood pressure mouth sores pain, tingling, numbness in the hands or feet signs of decreased platelets or bleeding - bruising, pinpoint red spots on the skin, black, tarry stools, blood in the urine signs of decreased red blood cells - unusually weak or tired, feeling faint or lightheaded, falls signs of infection - fever or chills, cough, sore throat, pain or difficulty passing urine signs and symptoms of liver injury like dark yellow or brown urine; general ill feeling or flu-like symptoms; light-colored stools; loss of appetite; nausea; right upper belly pain; unusually weak or tired; yellowing of the eyes or skin swelling of the ankles, feet, hands unusually slow heartbeat Side effects that usually do not require medical attention (report to your doctor or health care professional if they continue or are bothersome): diarrhea hair loss loss of appetite muscle or joint pain nausea, vomiting pain, redness, or irritation at site where injected tiredness This list may not describe all possible side effects. Call your doctor for medical advice about side effects. You may report side effects to FDA at 1-800-FDA-1088. Where should I keep my medication? This drug  is given in a hospital or clinic and will not be stored at home. NOTE: This sheet is a summary. It may not cover all possible information. If you have questions about this medicine, talk to your doctor, pharmacist, or health care provider.  2023 Elsevier/Gold Standard (2021-05-27 00:00:00) Carboplatin injection What is this medication? CARBOPLATIN (KAR boe pla tin) is a chemotherapy drug. It targets fast dividing cells, like cancer cells, and causes these cells to die. This medicine is used to treat ovarian cancer and many other cancers. This medicine may be used for other purposes; ask your health care provider or pharmacist if you have questions. COMMON BRAND NAME(S): Paraplatin What should I tell my care team before I take this medication? They need to know if you have any of these conditions: blood disorders hearing problems kidney disease recent or ongoing radiation therapy an unusual or allergic reaction to carboplatin, cisplatin, other chemotherapy, other medicines, foods, dyes, or preservatives pregnant or trying to get pregnant breast-feeding How should I use this medication? This drug is usually given as an infusion into a vein. It is administered in a hospital or clinic by a specially trained health care professional. Talk to your pediatrician regarding the use of this medicine in children. Special care may   be needed. Overdosage: If you think you have taken too much of this medicine contact a poison control center or emergency room at once. NOTE: This medicine is only for you. Do not share this medicine with others. What if I miss a dose? It is important not to miss a dose. Call your doctor or health care professional if you are unable to keep an appointment. What may interact with this medication? medicines for seizures medicines to increase blood counts like filgrastim, pegfilgrastim, sargramostim some antibiotics like amikacin, gentamicin, neomycin, streptomycin,  tobramycin vaccines Talk to your doctor or health care professional before taking any of these medicines: acetaminophen aspirin ibuprofen ketoprofen naproxen This list may not describe all possible interactions. Give your health care provider a list of all the medicines, herbs, non-prescription drugs, or dietary supplements you use. Also tell them if you smoke, drink alcohol, or use illegal drugs. Some items may interact with your medicine. What should I watch for while using this medication? Your condition will be monitored carefully while you are receiving this medicine. You will need important blood work done while you are taking this medicine. This drug may make you feel generally unwell. This is not uncommon, as chemotherapy can affect healthy cells as well as cancer cells. Report any side effects. Continue your course of treatment even though you feel ill unless your doctor tells you to stop. In some cases, you may be given additional medicines to help with side effects. Follow all directions for their use. Call your doctor or health care professional for advice if you get a fever, chills or sore throat, or other symptoms of a cold or flu. Do not treat yourself. This drug decreases your body's ability to fight infections. Try to avoid being around people who are sick. This medicine may increase your risk to bruise or bleed. Call your doctor or health care professional if you notice any unusual bleeding. Be careful brushing and flossing your teeth or using a toothpick because you may get an infection or bleed more easily. If you have any dental work done, tell your dentist you are receiving this medicine. Avoid taking products that contain aspirin, acetaminophen, ibuprofen, naproxen, or ketoprofen unless instructed by your doctor. These medicines may hide a fever. Do not become pregnant while taking this medicine. Women should inform their doctor if they wish to become pregnant or think they  might be pregnant. There is a potential for serious side effects to an unborn child. Talk to your health care professional or pharmacist for more information. Do not breast-feed an infant while taking this medicine. What side effects may I notice from receiving this medication? Side effects that you should report to your doctor or health care professional as soon as possible: allergic reactions like skin rash, itching or hives, swelling of the face, lips, or tongue signs of infection - fever or chills, cough, sore throat, pain or difficulty passing urine signs of decreased platelets or bleeding - bruising, pinpoint red spots on the skin, black, tarry stools, nosebleeds signs of decreased red blood cells - unusually weak or tired, fainting spells, lightheadedness breathing problems changes in hearing changes in vision chest pain high blood pressure low blood counts - This drug may decrease the number of white blood cells, red blood cells and platelets. You may be at increased risk for infections and bleeding. nausea and vomiting pain, swelling, redness or irritation at the injection site pain, tingling, numbness in the hands or feet problems with balance, talking,   walking trouble passing urine or change in the amount of urine Side effects that usually do not require medical attention (report to your doctor or health care professional if they continue or are bothersome): hair loss loss of appetite metallic taste in the mouth or changes in taste This list may not describe all possible side effects. Call your doctor for medical advice about side effects. You may report side effects to FDA at 1-800-FDA-1088. Where should I keep my medication? This drug is given in a hospital or clinic and will not be stored at home. NOTE: This sheet is a summary. It may not cover all possible information. If you have questions about this medicine, talk to your doctor, pharmacist, or health care provider.  2023  Elsevier/Gold Standard (2007-12-04 00:00:00)   

## 2021-12-26 NOTE — Progress Notes (Signed)
Per Dr. Lorenso Courier, ok to proceed with treatment with calcium 11.6 mg/dL today.  Patient to receive additional 541m/hr to run concurrent with therapy.

## 2021-12-27 ENCOUNTER — Other Ambulatory Visit: Payer: Self-pay

## 2021-12-27 ENCOUNTER — Ambulatory Visit
Admission: RE | Admit: 2021-12-27 | Discharge: 2021-12-27 | Disposition: A | Discharge status: Home / Self Care | Payer: No Typology Code available for payment source | Source: Ambulatory Visit | Attending: Radiation Oncology | Admitting: Radiation Oncology

## 2021-12-27 DIAGNOSIS — Z510 Encounter for antineoplastic radiation therapy: Secondary | ICD-10-CM | POA: Diagnosis not present

## 2021-12-27 LAB — RAD ONC ARIA SESSION SUMMARY
Course Elapsed Days: 1
Plan Fractions Treated to Date: 2
Plan Prescribed Dose Per Fraction: 1.8 Gy
Plan Total Fractions Prescribed: 25
Plan Total Prescribed Dose: 45 Gy
Reference Point Dosage Given to Date: 3.6 Gy
Reference Point Session Dosage Given: 1.8 Gy
Session Number: 2

## 2021-12-28 ENCOUNTER — Ambulatory Visit
Admission: RE | Admit: 2021-12-28 | Discharge: 2021-12-28 | Disposition: A | Discharge status: Home / Self Care | Payer: No Typology Code available for payment source | Source: Ambulatory Visit | Attending: Radiation Oncology | Admitting: Radiation Oncology

## 2021-12-28 ENCOUNTER — Other Ambulatory Visit: Payer: Self-pay

## 2021-12-28 DIAGNOSIS — Z510 Encounter for antineoplastic radiation therapy: Secondary | ICD-10-CM | POA: Diagnosis not present

## 2021-12-28 LAB — RAD ONC ARIA SESSION SUMMARY
Course Elapsed Days: 2
Plan Fractions Treated to Date: 3
Plan Prescribed Dose Per Fraction: 1.8 Gy
Plan Total Fractions Prescribed: 25
Plan Total Prescribed Dose: 45 Gy
Reference Point Dosage Given to Date: 5.4 Gy
Reference Point Session Dosage Given: 1.8 Gy
Session Number: 3

## 2021-12-29 ENCOUNTER — Other Ambulatory Visit: Payer: Self-pay

## 2021-12-29 ENCOUNTER — Encounter: Payer: Self-pay | Admitting: Hematology and Oncology

## 2021-12-29 ENCOUNTER — Ambulatory Visit
Admission: RE | Admit: 2021-12-29 | Discharge: 2021-12-29 | Disposition: A | Discharge status: Home / Self Care | Payer: No Typology Code available for payment source | Source: Ambulatory Visit | Attending: Radiation Oncology | Admitting: Radiation Oncology

## 2021-12-29 DIAGNOSIS — Z510 Encounter for antineoplastic radiation therapy: Secondary | ICD-10-CM | POA: Diagnosis not present

## 2021-12-29 LAB — RAD ONC ARIA SESSION SUMMARY
Course Elapsed Days: 3
Plan Fractions Treated to Date: 4
Plan Prescribed Dose Per Fraction: 1.8 Gy
Plan Total Fractions Prescribed: 25
Plan Total Prescribed Dose: 45 Gy
Reference Point Dosage Given to Date: 7.2 Gy
Reference Point Session Dosage Given: 1.8 Gy
Session Number: 4

## 2021-12-29 NOTE — Progress Notes (Signed)
Pt returned my call and was interested in applying for the [employer].  I gave him the income requirement but he exceeds that amount so unfortunately he doesn't qualify for the grant at this time.

## 2021-12-30 ENCOUNTER — Ambulatory Visit
Admission: RE | Admit: 2021-12-30 | Discharge: 2021-12-30 | Disposition: A | Discharge status: Home / Self Care | Payer: No Typology Code available for payment source | Source: Ambulatory Visit | Attending: Radiation Oncology | Admitting: Radiation Oncology

## 2021-12-30 ENCOUNTER — Other Ambulatory Visit: Payer: Self-pay

## 2021-12-30 DIAGNOSIS — Z510 Encounter for antineoplastic radiation therapy: Secondary | ICD-10-CM | POA: Diagnosis not present

## 2021-12-30 LAB — RAD ONC ARIA SESSION SUMMARY
Course Elapsed Days: 4
Plan Fractions Treated to Date: 5
Plan Prescribed Dose Per Fraction: 1.8 Gy
Plan Total Fractions Prescribed: 25
Plan Total Prescribed Dose: 45 Gy
Reference Point Dosage Given to Date: 9 Gy
Reference Point Session Dosage Given: 1.8 Gy
Session Number: 5

## 2021-12-30 MED FILL — Dexamethasone Sodium Phosphate Inj 100 MG/10ML: INTRAMUSCULAR | Qty: 1 | Status: AC

## 2022-01-01 ENCOUNTER — Other Ambulatory Visit: Payer: Self-pay | Admitting: Hematology and Oncology

## 2022-01-01 DIAGNOSIS — C159 Malignant neoplasm of esophagus, unspecified: Secondary | ICD-10-CM

## 2022-01-02 ENCOUNTER — Inpatient Hospital Stay: Payer: No Typology Code available for payment source

## 2022-01-02 ENCOUNTER — Ambulatory Visit
Admission: RE | Admit: 2022-01-02 | Discharge: 2022-01-02 | Disposition: A | Discharge status: Home / Self Care | Payer: No Typology Code available for payment source | Source: Ambulatory Visit | Attending: Radiation Oncology | Admitting: Radiation Oncology

## 2022-01-02 ENCOUNTER — Inpatient Hospital Stay (HOSPITAL_BASED_OUTPATIENT_CLINIC_OR_DEPARTMENT_OTHER): Payer: No Typology Code available for payment source | Admitting: Hematology and Oncology

## 2022-01-02 ENCOUNTER — Other Ambulatory Visit: Payer: Self-pay

## 2022-01-02 VITALS — BP 120/72 | HR 65 | Temp 97.7°F | Resp 16 | Wt 348.7 lb

## 2022-01-02 DIAGNOSIS — Z510 Encounter for antineoplastic radiation therapy: Secondary | ICD-10-CM | POA: Diagnosis not present

## 2022-01-02 DIAGNOSIS — Z95828 Presence of other vascular implants and grafts: Secondary | ICD-10-CM

## 2022-01-02 DIAGNOSIS — C159 Malignant neoplasm of esophagus, unspecified: Secondary | ICD-10-CM

## 2022-01-02 DIAGNOSIS — C155 Malignant neoplasm of lower third of esophagus: Secondary | ICD-10-CM | POA: Diagnosis not present

## 2022-01-02 LAB — RAD ONC ARIA SESSION SUMMARY
Course Elapsed Days: 7
Plan Fractions Treated to Date: 6
Plan Prescribed Dose Per Fraction: 1.8 Gy
Plan Total Fractions Prescribed: 25
Plan Total Prescribed Dose: 45 Gy
Reference Point Dosage Given to Date: 10.8 Gy
Reference Point Session Dosage Given: 1.8 Gy
Session Number: 6

## 2022-01-02 LAB — CBC WITH DIFFERENTIAL (CANCER CENTER ONLY)
Abs Immature Granulocytes: 0.03 10*3/uL (ref 0.00–0.07)
Basophils Absolute: 0 10*3/uL (ref 0.0–0.1)
Basophils Relative: 1 %
Eosinophils Absolute: 0.1 10*3/uL (ref 0.0–0.5)
Eosinophils Relative: 2 %
HCT: 32.4 % — ABNORMAL LOW (ref 39.0–52.0)
Hemoglobin: 11 g/dL — ABNORMAL LOW (ref 13.0–17.0)
Immature Granulocytes: 1 %
Lymphocytes Relative: 6 %
Lymphs Abs: 0.2 10*3/uL — ABNORMAL LOW (ref 0.7–4.0)
MCH: 29.3 pg (ref 26.0–34.0)
MCHC: 34 g/dL (ref 30.0–36.0)
MCV: 86.4 fL (ref 80.0–100.0)
Monocytes Absolute: 0.2 10*3/uL (ref 0.1–1.0)
Monocytes Relative: 6 %
Neutro Abs: 2.5 10*3/uL (ref 1.7–7.7)
Neutrophils Relative %: 84 %
Platelet Count: 189 10*3/uL (ref 150–400)
RBC: 3.75 MIL/uL — ABNORMAL LOW (ref 4.22–5.81)
RDW: 13.8 % (ref 11.5–15.5)
WBC Count: 3 10*3/uL — ABNORMAL LOW (ref 4.0–10.5)
nRBC: 0 % (ref 0.0–0.2)

## 2022-01-02 LAB — CMP (CANCER CENTER ONLY)
ALT: 11 U/L (ref 0–44)
AST: 10 U/L — ABNORMAL LOW (ref 15–41)
Albumin: 3.7 g/dL (ref 3.5–5.0)
Alkaline Phosphatase: 48 U/L (ref 38–126)
Anion gap: 4 — ABNORMAL LOW (ref 5–15)
BUN: 34 mg/dL — ABNORMAL HIGH (ref 8–23)
CO2: 26 mmol/L (ref 22–32)
Calcium: 11 mg/dL — ABNORMAL HIGH (ref 8.9–10.3)
Chloride: 106 mmol/L (ref 98–111)
Creatinine: 0.92 mg/dL (ref 0.61–1.24)
GFR, Estimated: >60 mL/min (ref >60)
Glucose, Bld: 99 mg/dL (ref 70–99)
Potassium: 4.2 mmol/L (ref 3.5–5.1)
Sodium: 136 mmol/L (ref 135–145)
Total Bilirubin: 0.5 mg/dL (ref 0.3–1.2)
Total Protein: 6.2 g/dL — ABNORMAL LOW (ref 6.5–8.1)

## 2022-01-02 LAB — MAGNESIUM: Magnesium: 1.8 mg/dL (ref 1.7–2.4)

## 2022-01-02 MED ORDER — HEPARIN SOD (PORK) LOCK FLUSH 100 UNIT/ML IV SOLN
500.0000 [IU] | Freq: Once | INTRAVENOUS | Status: AC | PRN
  Administered 2022-01-02: 500 [IU]

## 2022-01-02 MED ORDER — SODIUM CHLORIDE 0.9% FLUSH
10.0000 mL | INTRAVENOUS | Status: DC | PRN
  Administered 2022-01-02: 10 mL

## 2022-01-02 MED ORDER — PALONOSETRON HCL INJECTION 0.25 MG/5ML
0.2500 mg | Freq: Once | INTRAVENOUS | Status: AC
  Administered 2022-01-02: 0.25 mg via INTRAVENOUS
  Filled 2022-01-02: qty 5

## 2022-01-02 MED ORDER — SODIUM CHLORIDE 0.9 % IV SOLN
10.0000 mg | Freq: Once | INTRAVENOUS | Status: AC
  Administered 2022-01-02: 10 mg via INTRAVENOUS
  Filled 2022-01-02: qty 10

## 2022-01-02 MED ORDER — FAMOTIDINE IN NACL 20-0.9 MG/50ML-% IV SOLN
20.0000 mg | Freq: Once | INTRAVENOUS | Status: AC
  Administered 2022-01-02: 20 mg via INTRAVENOUS
  Filled 2022-01-02: qty 50

## 2022-01-02 MED ORDER — SODIUM CHLORIDE 0.9 % IV SOLN
50.0000 mg/m2 | Freq: Once | INTRAVENOUS | Status: AC
  Administered 2022-01-02: 144 mg via INTRAVENOUS
  Filled 2022-01-02: qty 24

## 2022-01-02 MED ORDER — DIPHENHYDRAMINE HCL 50 MG/ML IJ SOLN
50.0000 mg | Freq: Once | INTRAMUSCULAR | Status: AC
  Administered 2022-01-02: 50 mg via INTRAVENOUS
  Filled 2022-01-02: qty 1

## 2022-01-02 MED ORDER — SODIUM CHLORIDE 0.9% FLUSH
10.0000 mL | Freq: Once | INTRAVENOUS | Status: AC
  Administered 2022-01-02: 10 mL

## 2022-01-02 MED ORDER — SODIUM CHLORIDE 0.9 % IV SOLN: Freq: Once | INTRAVENOUS | Status: AC

## 2022-01-02 MED ORDER — SODIUM CHLORIDE 0.9 % IV SOLN
300.0000 mg | Freq: Once | INTRAVENOUS | Status: AC
  Administered 2022-01-02: 300 mg via INTRAVENOUS
  Filled 2022-01-02: qty 30

## 2022-01-03 ENCOUNTER — Ambulatory Visit
Admission: RE | Admit: 2022-01-03 | Discharge: 2022-01-03 | Disposition: A | Discharge status: Home / Self Care | Payer: No Typology Code available for payment source | Source: Ambulatory Visit | Attending: Radiation Oncology | Admitting: Radiation Oncology

## 2022-01-03 ENCOUNTER — Other Ambulatory Visit: Payer: Self-pay

## 2022-01-03 ENCOUNTER — Ambulatory Visit (INDEPENDENT_AMBULATORY_CARE_PROVIDER_SITE_OTHER): Payer: No Typology Code available for payment source | Admitting: Acute Care

## 2022-01-03 ENCOUNTER — Encounter: Payer: Self-pay | Admitting: Acute Care

## 2022-01-03 VITALS — BP 126/74 | HR 85 | Ht 72.0 in | Wt 350.2 lb

## 2022-01-03 DIAGNOSIS — Z510 Encounter for antineoplastic radiation therapy: Secondary | ICD-10-CM | POA: Diagnosis not present

## 2022-01-03 DIAGNOSIS — I159 Secondary hypertension, unspecified: Secondary | ICD-10-CM | POA: Diagnosis not present

## 2022-01-03 DIAGNOSIS — R590 Localized enlarged lymph nodes: Secondary | ICD-10-CM | POA: Diagnosis not present

## 2022-01-03 DIAGNOSIS — C159 Malignant neoplasm of esophagus, unspecified: Secondary | ICD-10-CM | POA: Diagnosis not present

## 2022-01-03 LAB — RAD ONC ARIA SESSION SUMMARY
Course Elapsed Days: 8
Plan Fractions Treated to Date: 7
Plan Prescribed Dose Per Fraction: 1.8 Gy
Plan Total Fractions Prescribed: 25
Plan Total Prescribed Dose: 45 Gy
Reference Point Dosage Given to Date: 12.6 Gy
Reference Point Session Dosage Given: 1.8 Gy
Session Number: 7

## 2022-01-04 ENCOUNTER — Other Ambulatory Visit: Payer: Self-pay

## 2022-01-04 ENCOUNTER — Ambulatory Visit
Admission: RE | Admit: 2022-01-04 | Discharge: 2022-01-04 | Disposition: A | Discharge status: Home / Self Care | Payer: No Typology Code available for payment source | Source: Ambulatory Visit | Attending: Radiation Oncology | Admitting: Radiation Oncology

## 2022-01-04 DIAGNOSIS — Z510 Encounter for antineoplastic radiation therapy: Secondary | ICD-10-CM | POA: Diagnosis not present

## 2022-01-04 LAB — RAD ONC ARIA SESSION SUMMARY
Course Elapsed Days: 9
Plan Fractions Treated to Date: 8
Plan Prescribed Dose Per Fraction: 1.8 Gy
Plan Total Fractions Prescribed: 25
Plan Total Prescribed Dose: 45 Gy
Reference Point Dosage Given to Date: 14.4 Gy
Reference Point Session Dosage Given: 1.8 Gy
Session Number: 8

## 2022-01-05 ENCOUNTER — Ambulatory Visit
Admission: RE | Admit: 2022-01-05 | Discharge: 2022-01-05 | Disposition: A | Discharge status: Home / Self Care | Payer: No Typology Code available for payment source | Source: Ambulatory Visit | Attending: Radiation Oncology | Admitting: Radiation Oncology

## 2022-01-05 ENCOUNTER — Other Ambulatory Visit: Payer: Self-pay

## 2022-01-05 DIAGNOSIS — Z510 Encounter for antineoplastic radiation therapy: Secondary | ICD-10-CM | POA: Diagnosis not present

## 2022-01-05 LAB — RAD ONC ARIA SESSION SUMMARY
Course Elapsed Days: 10
Plan Fractions Treated to Date: 9
Plan Prescribed Dose Per Fraction: 1.8 Gy
Plan Total Fractions Prescribed: 25
Plan Total Prescribed Dose: 45 Gy
Reference Point Dosage Given to Date: 16.2 Gy
Reference Point Session Dosage Given: 1.8 Gy
Session Number: 9

## 2022-01-06 ENCOUNTER — Other Ambulatory Visit: Payer: Self-pay

## 2022-01-06 ENCOUNTER — Ambulatory Visit
Admission: RE | Admit: 2022-01-06 | Discharge: 2022-01-06 | Disposition: A | Discharge status: Home / Self Care | Payer: No Typology Code available for payment source | Source: Ambulatory Visit | Attending: Radiation Oncology | Admitting: Radiation Oncology

## 2022-01-06 DIAGNOSIS — Z510 Encounter for antineoplastic radiation therapy: Secondary | ICD-10-CM | POA: Diagnosis not present

## 2022-01-06 LAB — RAD ONC ARIA SESSION SUMMARY
Course Elapsed Days: 11
Plan Fractions Treated to Date: 10
Plan Prescribed Dose Per Fraction: 1.8 Gy
Plan Total Fractions Prescribed: 25
Plan Total Prescribed Dose: 45 Gy
Reference Point Dosage Given to Date: 18 Gy
Reference Point Session Dosage Given: 1.8 Gy
Session Number: 10

## 2022-01-06 MED FILL — Dexamethasone Sodium Phosphate Inj 100 MG/10ML: INTRAMUSCULAR | Qty: 1 | Status: AC

## 2022-01-09 ENCOUNTER — Inpatient Hospital Stay: Payer: No Typology Code available for payment source

## 2022-01-09 ENCOUNTER — Ambulatory Visit
Admission: RE | Admit: 2022-01-09 | Discharge: 2022-01-09 | Disposition: A | Discharge status: Home / Self Care | Payer: No Typology Code available for payment source | Source: Ambulatory Visit | Attending: Radiation Oncology | Admitting: Radiation Oncology

## 2022-01-09 ENCOUNTER — Inpatient Hospital Stay: Payer: No Typology Code available for payment source | Attending: Physician Assistant

## 2022-01-09 ENCOUNTER — Other Ambulatory Visit: Payer: Self-pay

## 2022-01-09 VITALS — BP 134/73 | HR 86 | Temp 98.1°F | Resp 20 | Wt 344.8 lb

## 2022-01-09 DIAGNOSIS — Z800 Family history of malignant neoplasm of digestive organs: Secondary | ICD-10-CM | POA: Insufficient documentation

## 2022-01-09 DIAGNOSIS — Z79899 Other long term (current) drug therapy: Secondary | ICD-10-CM | POA: Insufficient documentation

## 2022-01-09 DIAGNOSIS — Z87442 Personal history of urinary calculi: Secondary | ICD-10-CM | POA: Diagnosis not present

## 2022-01-09 DIAGNOSIS — Z8051 Family history of malignant neoplasm of kidney: Secondary | ICD-10-CM | POA: Diagnosis not present

## 2022-01-09 DIAGNOSIS — Z818 Family history of other mental and behavioral disorders: Secondary | ICD-10-CM | POA: Diagnosis not present

## 2022-01-09 DIAGNOSIS — C159 Malignant neoplasm of esophagus, unspecified: Secondary | ICD-10-CM

## 2022-01-09 DIAGNOSIS — Z510 Encounter for antineoplastic radiation therapy: Secondary | ICD-10-CM | POA: Insufficient documentation

## 2022-01-09 DIAGNOSIS — Z8249 Family history of ischemic heart disease and other diseases of the circulatory system: Secondary | ICD-10-CM | POA: Insufficient documentation

## 2022-01-09 DIAGNOSIS — Z809 Family history of malignant neoplasm, unspecified: Secondary | ICD-10-CM | POA: Insufficient documentation

## 2022-01-09 DIAGNOSIS — K2270 Barrett's esophagus without dysplasia: Secondary | ICD-10-CM | POA: Insufficient documentation

## 2022-01-09 DIAGNOSIS — C155 Malignant neoplasm of lower third of esophagus: Secondary | ICD-10-CM | POA: Insufficient documentation

## 2022-01-09 DIAGNOSIS — R110 Nausea: Secondary | ICD-10-CM | POA: Diagnosis not present

## 2022-01-09 DIAGNOSIS — R590 Localized enlarged lymph nodes: Secondary | ICD-10-CM | POA: Diagnosis not present

## 2022-01-09 DIAGNOSIS — Z8052 Family history of malignant neoplasm of bladder: Secondary | ICD-10-CM | POA: Insufficient documentation

## 2022-01-09 DIAGNOSIS — Z8616 Personal history of COVID-19: Secondary | ICD-10-CM | POA: Diagnosis not present

## 2022-01-09 DIAGNOSIS — Z87891 Personal history of nicotine dependence: Secondary | ICD-10-CM | POA: Insufficient documentation

## 2022-01-09 DIAGNOSIS — Z833 Family history of diabetes mellitus: Secondary | ICD-10-CM | POA: Diagnosis not present

## 2022-01-09 DIAGNOSIS — Z95828 Presence of other vascular implants and grafts: Secondary | ICD-10-CM

## 2022-01-09 DIAGNOSIS — Z5111 Encounter for antineoplastic chemotherapy: Secondary | ICD-10-CM | POA: Diagnosis present

## 2022-01-09 LAB — RAD ONC ARIA SESSION SUMMARY
Course Elapsed Days: 14
Plan Fractions Treated to Date: 11
Plan Prescribed Dose Per Fraction: 1.8 Gy
Plan Total Fractions Prescribed: 25
Plan Total Prescribed Dose: 45 Gy
Reference Point Dosage Given to Date: 19.8 Gy
Reference Point Session Dosage Given: 1.8 Gy
Session Number: 11

## 2022-01-09 LAB — CBC WITH DIFFERENTIAL (CANCER CENTER ONLY)
Abs Immature Granulocytes: 0.01 10*3/uL (ref 0.00–0.07)
Basophils Absolute: 0 10*3/uL (ref 0.0–0.1)
Basophils Relative: 0 %
Eosinophils Absolute: 0 10*3/uL (ref 0.0–0.5)
Eosinophils Relative: 1 %
HCT: 31.9 % — ABNORMAL LOW (ref 39.0–52.0)
Hemoglobin: 10.7 g/dL — ABNORMAL LOW (ref 13.0–17.0)
Immature Granulocytes: 0 %
Lymphocytes Relative: 3 %
Lymphs Abs: 0.1 10*3/uL — ABNORMAL LOW (ref 0.7–4.0)
MCH: 29.4 pg (ref 26.0–34.0)
MCHC: 33.5 g/dL (ref 30.0–36.0)
MCV: 87.6 fL (ref 80.0–100.0)
Monocytes Absolute: 0.3 10*3/uL (ref 0.1–1.0)
Monocytes Relative: 9 %
Neutro Abs: 2.4 10*3/uL (ref 1.7–7.7)
Neutrophils Relative %: 87 %
Platelet Count: 174 10*3/uL (ref 150–400)
RBC: 3.64 MIL/uL — ABNORMAL LOW (ref 4.22–5.81)
RDW: 14.1 % (ref 11.5–15.5)
WBC Count: 2.8 10*3/uL — ABNORMAL LOW (ref 4.0–10.5)
nRBC: 0 % (ref 0.0–0.2)

## 2022-01-09 LAB — CMP (CANCER CENTER ONLY)
ALT: 12 U/L (ref 0–44)
AST: 13 U/L — ABNORMAL LOW (ref 15–41)
Albumin: 3.9 g/dL (ref 3.5–5.0)
Alkaline Phosphatase: 56 U/L (ref 38–126)
Anion gap: 5 (ref 5–15)
BUN: 21 mg/dL (ref 8–23)
CO2: 26 mmol/L (ref 22–32)
Calcium: 10.1 mg/dL (ref 8.9–10.3)
Chloride: 103 mmol/L (ref 98–111)
Creatinine: 0.96 mg/dL (ref 0.61–1.24)
GFR, Estimated: >60 mL/min (ref >60)
Glucose, Bld: 118 mg/dL — ABNORMAL HIGH (ref 70–99)
Potassium: 4.5 mmol/L (ref 3.5–5.1)
Sodium: 134 mmol/L — ABNORMAL LOW (ref 135–145)
Total Bilirubin: 0.3 mg/dL (ref 0.3–1.2)
Total Protein: 6.6 g/dL (ref 6.5–8.1)

## 2022-01-09 LAB — MAGNESIUM: Magnesium: 1.8 mg/dL (ref 1.7–2.4)

## 2022-01-09 MED ORDER — PALONOSETRON HCL INJECTION 0.25 MG/5ML
0.2500 mg | Freq: Once | INTRAVENOUS | Status: AC
  Administered 2022-01-09: 0.25 mg via INTRAVENOUS
  Filled 2022-01-09: qty 5

## 2022-01-09 MED ORDER — SODIUM CHLORIDE 0.9% FLUSH
10.0000 mL | INTRAVENOUS | Status: DC | PRN
  Administered 2022-01-09: 10 mL

## 2022-01-09 MED ORDER — SODIUM CHLORIDE 0.9% FLUSH
10.0000 mL | Freq: Once | INTRAVENOUS | Status: AC
  Administered 2022-01-09: 10 mL

## 2022-01-09 MED ORDER — DIPHENHYDRAMINE HCL 50 MG/ML IJ SOLN
50.0000 mg | Freq: Once | INTRAMUSCULAR | Status: AC
  Administered 2022-01-09: 50 mg via INTRAVENOUS
  Filled 2022-01-09: qty 1

## 2022-01-09 MED ORDER — SODIUM CHLORIDE 0.9 % IV SOLN
50.0000 mg/m2 | Freq: Once | INTRAVENOUS | Status: AC
  Administered 2022-01-09: 144 mg via INTRAVENOUS
  Filled 2022-01-09: qty 24

## 2022-01-09 MED ORDER — SODIUM CHLORIDE 0.9 % IV SOLN
50.0000 mg/m2 | Freq: Once | INTRAVENOUS | Status: DC
  Filled 2022-01-09: qty 24

## 2022-01-09 MED ORDER — SODIUM CHLORIDE 0.9 % IV SOLN
300.0000 mg | Freq: Once | INTRAVENOUS | Status: AC
  Administered 2022-01-09: 300 mg via INTRAVENOUS
  Filled 2022-01-09: qty 30

## 2022-01-09 MED ORDER — SODIUM CHLORIDE 0.9 % IV SOLN: Freq: Once | INTRAVENOUS | Status: AC

## 2022-01-09 MED ORDER — HEPARIN SOD (PORK) LOCK FLUSH 100 UNIT/ML IV SOLN
500.0000 [IU] | Freq: Once | INTRAVENOUS | Status: AC | PRN
  Administered 2022-01-09: 500 [IU]

## 2022-01-09 MED ORDER — FAMOTIDINE IN NACL 20-0.9 MG/50ML-% IV SOLN
20.0000 mg | Freq: Once | INTRAVENOUS | Status: AC
  Administered 2022-01-09: 20 mg via INTRAVENOUS
  Filled 2022-01-09: qty 50

## 2022-01-09 MED ORDER — SODIUM CHLORIDE 0.9 % IV SOLN
10.0000 mg | Freq: Once | INTRAVENOUS | Status: AC
  Administered 2022-01-09: 10 mg via INTRAVENOUS
  Filled 2022-01-09: qty 10

## 2022-01-09 NOTE — Patient Instructions (Signed)
[facility]  Discharge Instructions: Thank you for choosing [facility] to provide your oncology and hematology care.   If you have a lab appointment with the Cancer Center, please go directly to the Cancer Center and check in at the registration area.   Wear comfortable clothing and clothing appropriate for easy access to any Portacath or PICC line.   We strive to give you quality time with your provider. You may need to reschedule your appointment if you arrive late (15 or more minutes).  Arriving late affects you and other patients whose appointments are after yours.  Also, if you miss three or more appointments without notifying the office, you may be dismissed from the clinic at the provider's discretion.      For prescription refill requests, have your pharmacy contact our office and allow 72 hours for refills to be completed.    Today you received the following chemotherapy and/or immunotherapy agents Taxol & Carboplatin      To help prevent nausea and vomiting after your treatment, we encourage you to take your nausea medication as directed.  BELOW ARE SYMPTOMS THAT SHOULD BE REPORTED IMMEDIATELY: *FEVER GREATER THAN 100.4 F (38 C) OR HIGHER *CHILLS OR SWEATING *NAUSEA AND VOMITING THAT IS NOT CONTROLLED WITH YOUR NAUSEA MEDICATION *UNUSUAL SHORTNESS OF BREATH *UNUSUAL BRUISING OR BLEEDING *URINARY PROBLEMS (pain or burning when urinating, or frequent urination) *BOWEL PROBLEMS (unusual diarrhea, constipation, pain near the anus) TENDERNESS IN MOUTH AND THROAT WITH OR WITHOUT PRESENCE OF ULCERS (sore throat, sores in mouth, or a toothache) UNUSUAL RASH, SWELLING OR PAIN  UNUSUAL VAGINAL DISCHARGE OR ITCHING   Items with * indicate a potential emergency and should be followed up as soon as possible or go to the Emergency Department if any problems should occur.  Please show the CHEMOTHERAPY ALERT CARD or IMMUNOTHERAPY ALERT CARD at  check-in to the Emergency Department and triage nurse.  Should you have questions after your visit or need to cancel or reschedule your appointment, please contact [facility]  Dept: [phone]  and follow the prompts.  Office hours are 8:00 a.m. to 4:30 p.m. Monday - Friday. Please note that voicemails left after 4:00 p.m. may not be returned until the following business day.  We are closed weekends and major holidays. You have access to a nurse at all times for urgent questions. Please call the main number to the clinic Dept: [phone] and follow the prompts.   For any non-urgent questions, you may also contact your provider using MyChart. We now offer e-Visits for anyone 18 and older to request care online for non-urgent symptoms. For details visit [email or web address].   Also download the MyChart app! Go to the app store, search "MyChart", open the app, select , and log in with your MyChart username and password.  Masks are optional in the cancer centers. If you would like for your care team to wear a mask while they are taking care of you, please let them know. For doctor visits, patients may have with them one support person who is at least 61 years old. At this time, visitors are not allowed in the infusion area. 

## 2022-01-11 ENCOUNTER — Ambulatory Visit
Admission: RE | Admit: 2022-01-11 | Discharge: 2022-01-11 | Disposition: A | Discharge status: Home / Self Care | Payer: No Typology Code available for payment source | Source: Ambulatory Visit | Attending: Radiation Oncology | Admitting: Radiation Oncology

## 2022-01-11 ENCOUNTER — Other Ambulatory Visit: Payer: Self-pay

## 2022-01-11 DIAGNOSIS — Z510 Encounter for antineoplastic radiation therapy: Secondary | ICD-10-CM | POA: Diagnosis not present

## 2022-01-11 LAB — RAD ONC ARIA SESSION SUMMARY
Course Elapsed Days: 16
Plan Fractions Treated to Date: 12
Plan Prescribed Dose Per Fraction: 1.8 Gy
Plan Total Fractions Prescribed: 25
Plan Total Prescribed Dose: 45 Gy
Reference Point Dosage Given to Date: 21.6 Gy
Reference Point Session Dosage Given: 1.8 Gy
Session Number: 12

## 2022-01-12 ENCOUNTER — Other Ambulatory Visit: Payer: Self-pay

## 2022-01-12 ENCOUNTER — Ambulatory Visit
Admission: RE | Admit: 2022-01-12 | Discharge: 2022-01-12 | Disposition: A | Discharge status: Home / Self Care | Payer: No Typology Code available for payment source | Source: Ambulatory Visit | Attending: Radiation Oncology | Admitting: Radiation Oncology

## 2022-01-12 DIAGNOSIS — Z510 Encounter for antineoplastic radiation therapy: Secondary | ICD-10-CM | POA: Diagnosis not present

## 2022-01-12 LAB — RAD ONC ARIA SESSION SUMMARY
Course Elapsed Days: 17
Plan Fractions Treated to Date: 13
Plan Prescribed Dose Per Fraction: 1.8 Gy
Plan Total Fractions Prescribed: 25
Plan Total Prescribed Dose: 45 Gy
Reference Point Dosage Given to Date: 23.4 Gy
Reference Point Session Dosage Given: 1.8 Gy
Session Number: 13

## 2022-01-13 ENCOUNTER — Other Ambulatory Visit: Payer: Self-pay

## 2022-01-13 ENCOUNTER — Ambulatory Visit
Admission: RE | Admit: 2022-01-13 | Discharge: 2022-01-13 | Disposition: A | Discharge status: Home / Self Care | Payer: No Typology Code available for payment source | Source: Ambulatory Visit | Attending: Radiation Oncology | Admitting: Radiation Oncology

## 2022-01-13 DIAGNOSIS — Z510 Encounter for antineoplastic radiation therapy: Secondary | ICD-10-CM | POA: Diagnosis not present

## 2022-01-13 DIAGNOSIS — Z5111 Encounter for antineoplastic chemotherapy: Secondary | ICD-10-CM | POA: Diagnosis not present

## 2022-01-13 LAB — RAD ONC ARIA SESSION SUMMARY
Course Elapsed Days: 18
Plan Fractions Treated to Date: 14
Plan Prescribed Dose Per Fraction: 1.8 Gy
Plan Total Fractions Prescribed: 25
Plan Total Prescribed Dose: 45 Gy
Reference Point Dosage Given to Date: 25.2 Gy
Reference Point Session Dosage Given: 1.8 Gy
Session Number: 14

## 2022-01-13 MED FILL — Dexamethasone Sodium Phosphate Inj 100 MG/10ML: INTRAMUSCULAR | Qty: 1 | Status: AC

## 2022-01-16 ENCOUNTER — Ambulatory Visit
Admission: RE | Admit: 2022-01-16 | Discharge: 2022-01-16 | Disposition: A | Discharge status: Home / Self Care | Payer: No Typology Code available for payment source | Source: Ambulatory Visit | Attending: Radiation Oncology | Admitting: Radiation Oncology

## 2022-01-16 ENCOUNTER — Other Ambulatory Visit: Payer: Self-pay

## 2022-01-16 ENCOUNTER — Inpatient Hospital Stay: Payer: No Typology Code available for payment source

## 2022-01-16 ENCOUNTER — Inpatient Hospital Stay: Payer: No Typology Code available for payment source | Admitting: Nutrition

## 2022-01-16 ENCOUNTER — Inpatient Hospital Stay (HOSPITAL_BASED_OUTPATIENT_CLINIC_OR_DEPARTMENT_OTHER): Payer: No Typology Code available for payment source | Admitting: Hematology and Oncology

## 2022-01-16 VITALS — HR 100

## 2022-01-16 VITALS — BP 105/80 | HR 103 | Temp 97.6°F | Resp 17 | Ht 72.0 in | Wt 340.7 lb

## 2022-01-16 DIAGNOSIS — Z510 Encounter for antineoplastic radiation therapy: Secondary | ICD-10-CM | POA: Diagnosis not present

## 2022-01-16 DIAGNOSIS — Z95828 Presence of other vascular implants and grafts: Secondary | ICD-10-CM

## 2022-01-16 DIAGNOSIS — C159 Malignant neoplasm of esophagus, unspecified: Secondary | ICD-10-CM

## 2022-01-16 DIAGNOSIS — Z5111 Encounter for antineoplastic chemotherapy: Secondary | ICD-10-CM | POA: Diagnosis not present

## 2022-01-16 LAB — RAD ONC ARIA SESSION SUMMARY
Course Elapsed Days: 21
Plan Fractions Treated to Date: 15
Plan Prescribed Dose Per Fraction: 1.8 Gy
Plan Total Fractions Prescribed: 25
Plan Total Prescribed Dose: 45 Gy
Reference Point Dosage Given to Date: 27 Gy
Reference Point Session Dosage Given: 1.8 Gy
Session Number: 15

## 2022-01-16 LAB — CMP (CANCER CENTER ONLY)
ALT: 14 U/L (ref 0–44)
AST: 16 U/L (ref 15–41)
Albumin: 3.9 g/dL (ref 3.5–5.0)
Alkaline Phosphatase: 46 U/L (ref 38–126)
Anion gap: 4 — ABNORMAL LOW (ref 5–15)
BUN: 30 mg/dL — ABNORMAL HIGH (ref 8–23)
CO2: 28 mmol/L (ref 22–32)
Calcium: 10.9 mg/dL — ABNORMAL HIGH (ref 8.9–10.3)
Chloride: 104 mmol/L (ref 98–111)
Creatinine: 1.01 mg/dL (ref 0.61–1.24)
GFR, Estimated: >60 mL/min (ref >60)
Glucose, Bld: 103 mg/dL — ABNORMAL HIGH (ref 70–99)
Potassium: 4.3 mmol/L (ref 3.5–5.1)
Sodium: 136 mmol/L (ref 135–145)
Total Bilirubin: 0.5 mg/dL (ref 0.3–1.2)
Total Protein: 6.5 g/dL (ref 6.5–8.1)

## 2022-01-16 LAB — CBC WITH DIFFERENTIAL (CANCER CENTER ONLY)
Abs Immature Granulocytes: 0.02 10*3/uL (ref 0.00–0.07)
Basophils Absolute: 0 10*3/uL (ref 0.0–0.1)
Basophils Relative: 0 %
Eosinophils Absolute: 0 10*3/uL (ref 0.0–0.5)
Eosinophils Relative: 0 %
HCT: 33.3 % — ABNORMAL LOW (ref 39.0–52.0)
Hemoglobin: 11.5 g/dL — ABNORMAL LOW (ref 13.0–17.0)
Immature Granulocytes: 1 %
Lymphocytes Relative: 2 %
Lymphs Abs: 0.1 10*3/uL — ABNORMAL LOW (ref 0.7–4.0)
MCH: 30.2 pg (ref 26.0–34.0)
MCHC: 34.5 g/dL (ref 30.0–36.0)
MCV: 87.4 fL (ref 80.0–100.0)
Monocytes Absolute: 0.2 10*3/uL (ref 0.1–1.0)
Monocytes Relative: 8 %
Neutro Abs: 2.2 10*3/uL (ref 1.7–7.7)
Neutrophils Relative %: 89 %
Platelet Count: 129 10*3/uL — ABNORMAL LOW (ref 150–400)
RBC: 3.81 MIL/uL — ABNORMAL LOW (ref 4.22–5.81)
RDW: 14.4 % (ref 11.5–15.5)
WBC Count: 2.5 10*3/uL — ABNORMAL LOW (ref 4.0–10.5)
nRBC: 0 % (ref 0.0–0.2)

## 2022-01-16 LAB — GLUCOSE, CAPILLARY: Glucose-Capillary: 155 mg/dL — ABNORMAL HIGH (ref 70–99)

## 2022-01-16 LAB — MAGNESIUM: Magnesium: 1.8 mg/dL (ref 1.7–2.4)

## 2022-01-16 MED ORDER — PALONOSETRON HCL INJECTION 0.25 MG/5ML
0.2500 mg | Freq: Once | INTRAVENOUS | Status: AC
  Administered 2022-01-16: 0.25 mg via INTRAVENOUS
  Filled 2022-01-16: qty 5

## 2022-01-16 MED ORDER — FAMOTIDINE IN NACL 20-0.9 MG/50ML-% IV SOLN
20.0000 mg | Freq: Once | INTRAVENOUS | Status: AC
  Administered 2022-01-16: 20 mg via INTRAVENOUS
  Filled 2022-01-16: qty 50

## 2022-01-16 MED ORDER — SODIUM CHLORIDE 0.9% FLUSH
10.0000 mL | Freq: Once | INTRAVENOUS | Status: AC
  Administered 2022-01-16: 10 mL

## 2022-01-16 MED ORDER — SODIUM CHLORIDE 0.9 % IV SOLN
50.0000 mg/m2 | Freq: Once | INTRAVENOUS | Status: AC
  Administered 2022-01-16: 144 mg via INTRAVENOUS
  Filled 2022-01-16: qty 24

## 2022-01-16 MED ORDER — SODIUM CHLORIDE 0.9 % IV SOLN
300.0000 mg | Freq: Once | INTRAVENOUS | Status: AC
  Administered 2022-01-16: 300 mg via INTRAVENOUS
  Filled 2022-01-16: qty 30

## 2022-01-16 MED ORDER — SODIUM CHLORIDE 0.9 % IV SOLN: Freq: Once | INTRAVENOUS | Status: AC

## 2022-01-16 MED ORDER — SODIUM CHLORIDE 0.9 % IV SOLN
10.0000 mg | Freq: Once | INTRAVENOUS | Status: AC
  Administered 2022-01-16: 10 mg via INTRAVENOUS
  Filled 2022-01-16: qty 10

## 2022-01-16 MED ORDER — HEPARIN SOD (PORK) LOCK FLUSH 100 UNIT/ML IV SOLN
500.0000 [IU] | Freq: Once | INTRAVENOUS | Status: AC | PRN
  Administered 2022-01-16: 500 [IU]

## 2022-01-16 MED ORDER — DIPHENHYDRAMINE HCL 50 MG/ML IJ SOLN
50.0000 mg | Freq: Once | INTRAMUSCULAR | Status: AC
  Administered 2022-01-16: 50 mg via INTRAVENOUS
  Filled 2022-01-16: qty 1

## 2022-01-16 MED ORDER — SODIUM CHLORIDE 0.9% FLUSH
10.0000 mL | INTRAVENOUS | Status: DC | PRN
  Administered 2022-01-16: 10 mL

## 2022-01-16 NOTE — Progress Notes (Signed)
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 Stage IIB (cT2,cN0, cM0) 11/13/2021: Presented to the emergency department complaining of dysphagia and food impaction. Underwent EGD with Dr. Hilarie Fredrickson. Findings revealed medium sized fungating mass in the lower third of th esophagus, 35 cm from the incisors and extending into the GE junction.   It appears to have arisen in the background of Barrett's esophagus, the mass was nonobstructing and partially circumferential. Pathology confirmed moderately to poorly differentiated adenocarcinoma.  11/30/2021: PET scan showed  hypermetabolic activity in the distal esophagus with an SUV of 7.7 with thickening measuring up to 15 mm and spanning just to above the GE junction.  Diffuse mediastinal adenopathy showed dense calcification in all nearly symmetric findings.  12/06/2021: Established care with Radiation Oncology with Dr. Kyung Rudd and Shona Simpson PA-C 12/12/2021: Establish care with Medication Oncology with Dr. Narda Rutherford and Dede Query PA-C Cycle 1 Day 1 of Carbo/Paclitaxel with radiation.   Interval History:  Keith Moreno 61 y.o. male with medical history significant for localized esophageal adenocarcinoma who presents for a follow up visit. The patient's last visit was on 01/02/2022. In the interim since the last visit he has continued chemoradiation therapy.  On exam today [name] is accompanied by his wife.  He reports he did have a little bit of nausea/queasiness this a.m. but that is the only episode that he has had.  He reports his appetite  has been fair and has been continue to eat pured food.  He is lost about 10 pounds in weight since our last visit 2 weeks ago.  He notes he is eating Jell-O and sugar-free pudding.  He notes he is not having any pain right now and is swallowing is about the same.  He has an upcoming appoint with [name] which he is looking forward to.  He also notes he has an appointment coming up in approximately 2 to 3 weeks to discuss his care at the [place].  Overall he is willing and able to proceed with treatment at this time.  He otherwise denies any fevers, chills, sweats, nausea, vomiting or diarrhea.  A full 10 point ROS was otherwise negative.  MEDICAL HISTORY:  Past Medical History:  Diagnosis Date   Anxiety    COVID 2022   mild case   History of kidney stones    Hypertension    Kidney infection    at age 1   Pneumonia    9 years ago   Pre-diabetes    PTSD (post-traumatic stress disorder)     SURGICAL HISTORY: Past Surgical History:  Procedure Laterality Date   BIOPSY  11/13/2021   Procedure: BIOPSY;  Surgeon: Jerene Bears, MD;  Location: [place];  Service: Gastroenterology;;   BIOPSY  12/14/2021   Procedure: BIOPSY;  Surgeon: Irving Copas., MD;  [facility] ENDOSCOPY;  Service: Gastroenterology;;   BRONCHIAL BRUSHINGS  12/14/2021   Procedure: BRONCHIAL BRUSHINGS;  Surgeon: Collene Gobble, MD;  Location: [place];  Service: Cardiopulmonary;;   BRONCHIAL NEEDLE ASPIRATION BIOPSY  12/14/2021   Procedure: BRONCHIAL NEEDLE ASPIRATION BIOPSIES;  Surgeon: Collene Gobble, MD;  Location: [place];  Service: Cardiopulmonary;;   ESOPHAGOGASTRODUODENOSCOPY N/A 11/13/2021   Procedure: ESOPHAGOGASTRODUODENOSCOPY (EGD);  Surgeon: Jerene Bears, MD;  Location: [facility] ENDOSCOPY;  Service: Gastroenterology;  Laterality: N/A;   ESOPHAGOGASTRODUODENOSCOPY (EGD) WITH PROPOFOL N/A 12/14/2021   Procedure: ESOPHAGOGASTRODUODENOSCOPY (EGD) WITH PROPOFOL;  Surgeon: Rush Landmark Telford Nab., MD;   Location: [place];  Service: Gastroenterology;  Laterality: N/A;   EUS N/A 12/14/2021   Procedure: UPPER ENDOSCOPIC ULTRASOUND (EUS) LINEAR;  Surgeon: Irving Copas., MD;  Location: [place];  Service: Gastroenterology;  Laterality: N/A;   FINE NEEDLE ASPIRATION  12/14/2021   Procedure: FINE NEEDLE ASPIRATION (FNA) LINEAR;  Surgeon: Irving Copas., MD;  Location: [place];  Service: Gastroenterology;;   FOREIGN BODY REMOVAL  11/13/2021   Procedure: FOREIGN BODY REMOVAL;  Surgeon: Jerene Bears, MD;  Location: [place];  Service: Gastroenterology;;   HUMERUS FRACTURE SURGERY Right    IR IMAGING GUIDED PORT INSERTION  12/23/2021   TONSILLECTOMY AND ADENOIDECTOMY     VIDEO BRONCHOSCOPY WITH ENDOBRONCHIAL ULTRASOUND Bilateral 12/14/2021   Procedure: VIDEO BRONCHOSCOPY WITH ENDOBRONCHIAL ULTRASOUND;  Surgeon: Collene Gobble, MD;  Location: [place];  Service: Cardiopulmonary;  Laterality: Bilateral;    SOCIAL HISTORY: Social History   Socioeconomic History   Marital status: Single    Spouse name: Not on file   Number of children: Not on file   Years of education: Not on file   Highest education level: Not on file  Occupational History   Not on file  Tobacco Use   Smoking status: Former    Types: Cigars, Cigarettes, Pipe    Quit date: 27    Years since quitting: 40.5   Smokeless tobacco: Former    Types: Chew    Quit date: 1983  Vaping Use   Vaping Use: Never used  Substance and Sexual Activity   Alcohol use: Yes    Comment: Occasional   Drug use: No   Sexual activity: Not on file  Other Topics Concern   Not on file  Social History Narrative   Not on file   Social Determinants of Health   Financial Resource Strain: Not on file  Food Insecurity: Not on file  Transportation Needs: Not on file  Physical Activity: Not on file  Stress: Not on file  Social Connections: Not on file  Intimate Partner Violence: Not on file    FAMILY  HISTORY: Family History  Problem Relation Age of Onset   Hypertension Mother    Diabetes Mother    Bipolar disorder Mother    Hypertension Father    Kidney cancer Sister    Colon cancer Paternal Uncle    Cancer Cousin    Bladder Cancer Cousin    Cancer Cousin     ALLERGIES:  is allergic to procaine, chocolate, and other.  MEDICATIONS:  Current Outpatient Medications  Medication Sig Dispense Refill   Alogliptin Benzoate 25 MG TABS Take 12.5 mg by mouth at bedtime.     amLODipine (NORVASC) 10 MG tablet Take 1 tablet by mouth at bedtime.     Ascorbic Acid (VITAMIN C) 1000 MG tablet Take 2,000 mg by mouth every morning.     Cholecalciferol (VITAMIN D3) 50 MCG (2000 UT) capsule Take 20,000 Units by mouth at bedtime. 10 gel caps     CINNAMON PO Take 4,000 mg by mouth 2 (two) times daily. 1000 mg each     diphenhydramine-acetaminophen (TYLENOL PM) 25-500 MG TABS tablet Take 2 tablets by mouth at bedtime. 14 tablet  Krill Oil 350 MG CAPS Take 1,050 mg by mouth at bedtime.     lidocaine (LIDODERM) 5 % APPLY 1 PATCH TO SKIN ONCE DAILY BACK PAIN (APPLY FOR 12 HOURS, THEN REMOVE FOR 12 HOURS)     lidocaine-prilocaine (EMLA) cream Apply 1 application. topically as needed. 30 g 0   lisinopril (PRINIVIL,ZESTRIL) 40 MG tablet Take 40 mg by mouth at bedtime.     MAGNESIUM CITRATE PO Take 100 mg by mouth at bedtime.     Melatonin 10 MG TABS Take 40 mg by mouth at bedtime as needed (Sleep).     olopatadine (PATANOL) 0.1 % ophthalmic solution Place 1 drop into both eyes 2 (two) times daily.     ondansetron (ZOFRAN-ODT) 8 MG disintegrating tablet Take 1 tablet (8 mg total) by mouth every 8 (eight) hours as needed for nausea or vomiting. 90 tablet 3   pioglitazone (ACTOS) 30 MG tablet Take 30 mg by mouth every morning.     Potassium 99 MG TABS Take 198 mg by mouth daily.     prochlorperazine (COMPAZINE) 10 MG tablet Take 1 tablet (10 mg total) by mouth every 6 (six) hours as needed for nausea or  vomiting. 90 tablet 3   vitamin E 180 MG (400 UNITS) capsule Take 1,200 Units by mouth 2 (two) times daily. Gel caps     No current facility-administered medications for this visit.   Facility-Administered Medications Ordered in Other Visits  Medication Dose Route Frequency Provider Last Rate Last Admin   CARBOplatin (PARAPLATIN) 300 mg in sodium chloride 0.9 % 100 mL chemo infusion  300 mg Intravenous Once Ledell Peoples IV, MD       heparin lock flush 100 unit/mL  500 Units Intracatheter Once PRN Orson Slick, MD       PACLitaxel (TAXOL) 144 mg in sodium chloride 0.9 % 250 mL chemo infusion (</= 67m/m2)  50 mg/m2 (Treatment Plan Recorded) Intravenous Once DOrson Slick MD       sodium chloride flush (NS) 0.9 % injection 10 mL  10 mL Intracatheter PRN DOrson Slick MD        REVIEW OF SYSTEMS:   Constitutional: ( - ) fevers, ( - )  chills , ( - ) night sweats Eyes: ( - ) blurriness of vision, ( - ) double vision, ( - ) watery eyes Ears, nose, mouth, throat, and face: ( - ) mucositis, ( - ) sore throat Respiratory: ( - ) cough, ( - ) dyspnea, ( - ) wheezes Cardiovascular: ( - ) palpitation, ( - ) chest discomfort, ( - ) lower extremity swelling Gastrointestinal:  ( - ) nausea, ( - ) heartburn, ( - ) change in bowel habits Skin: ( - ) abnormal skin rashes Lymphatics: ( - ) new lymphadenopathy, ( - ) easy bruising Neurological: ( - ) numbness, ( - ) tingling, ( - ) new weaknesses Behavioral/Psych: ( - ) mood change, ( - ) new changes  All other systems were reviewed with the patient and are negative.  PHYSICAL EXAMINATION: ECOG PERFORMANCE STATUS: 1 - Symptomatic but completely ambulatory  Vitals:   01/16/22 1004  BP: 105/80  Pulse: (!) 103  Resp: 17  Temp: 97.6 F (36.4 C)  SpO2: 98%    Filed Weights   01/16/22 1004  Weight: (!) 340 lb 11.2 oz (154.5 kg)     GENERAL: Well-appearing elderly Caucasian male, alert, no distress and comfortable SKIN: skin  color, texture,  turgor are normal, no rashes or significant lesions EYES: conjunctiva are pink and non-injected, sclera clear LUNGS: clear to auscultation and percussion with normal breathing effort HEART: regular rate & rhythm and no murmurs and no lower extremity edema Musculoskeletal: no cyanosis of digits and no clubbing  PSYCH: alert & oriented x 3, fluent speech NEURO: no focal motor/sensory deficits  LABORATORY DATA:  I have reviewed the data as listed    Latest Ref Rng & Units 01/16/2022    9:43 AM 01/09/2022   11:40 AM 01/02/2022    9:15 AM  CBC  WBC 4.0 - 10.5 K/uL 2.5  2.8  3.0   Hemoglobin 13.0 - 17.0 g/dL 11.5  10.7  11.0   Hematocrit 39.0 - 52.0 % 33.3  31.9  32.4   Platelets 150 - 400 K/uL 129  174  189        Latest Ref Rng & Units 01/16/2022    9:43 AM 01/09/2022   11:40 AM 01/02/2022    9:15 AM  CMP  Glucose 70 - 99 mg/dL 103  118  99   BUN 8 - 23 mg/dL 30  21  34   Creatinine 0.61 - 1.24 mg/dL 1.01  0.96  0.92   Sodium 135 - 145 mmol/L 136  134  136   Potassium 3.5 - 5.1 mmol/L 4.3  4.5  4.2   Chloride 98 - 111 mmol/L 104  103  106   CO2 22 - 32 mmol/L '28  26  26   ' Calcium 8.9 - 10.3 mg/dL 10.9  10.1  11.0   Total Protein 6.5 - 8.1 g/dL 6.5  6.6  6.2   Total Bilirubin 0.3 - 1.2 mg/dL 0.5  0.3  0.5   Alkaline Phos 38 - 126 U/L 46  56  48   AST 15 - 41 U/L '16  13  10   ' ALT 0 - 44 U/L '14  12  11     ' RADIOGRAPHIC STUDIES: IR IMAGING GUIDED PORT INSERTION  Result Date: 12/23/2021 CLINICAL DATA:  Esophageal carcinoma and need for porta cath for chemotherapy. EXAM: IMPLANTED PORT A CATH PLACEMENT WITH ULTRASOUND AND FLUOROSCOPIC GUIDANCE ANESTHESIA/SEDATION: Moderate (conscious) sedation was employed during this procedure. A total of Versed 4.0 mg and Fentanyl 200 mcg was administered intravenously by radiology nursing. Moderate Sedation Time: 37 minutes. The patient's level of consciousness and vital signs were monitored continuously by radiology nursing throughout  the procedure under my direct supervision. FLUOROSCOPY: 9.0 mGy. PROCEDURE: The procedure, risks, benefits, and alternatives were explained to the patient. Questions regarding the procedure were encouraged and answered. The patient understands and consents to the procedure. A time-out was performed prior to initiating the procedure. Ultrasound was utilized to confirm patency of the right internal jugular vein. A permanent ultrasound image was recorded. The right neck and chest were prepped with chlorhexidine in a sterile fashion, and a sterile drape was applied covering the operative field. Maximum barrier sterile technique with sterile gowns and gloves were used for the procedure. Local anesthesia was provided with 1% lidocaine. After creating a small venotomy incision, a 21 gauge needle was advanced into the right internal jugular vein under direct, real-time ultrasound guidance. Ultrasound image documentation was performed. After securing guidewire access, an 8 Fr dilator was placed. A J-wire was kinked to measure appropriate catheter length. A subcutaneous port pocket was then created along the upper chest wall utilizing sharp and blunt dissection. Portable cautery was utilized. The pocket was irrigated with  sterile saline. A single lumen power injectable port was chosen for placement. The 8 Fr catheter was tunneled from the port pocket site to the venotomy incision. The port was placed in the pocket. External catheter was trimmed to appropriate length based on guidewire measurement. At the venotomy, an 8 Fr peel-away sheath was placed over a guidewire. The catheter was then placed through the sheath and the sheath removed. Final catheter positioning was confirmed and documented with a fluoroscopic spot image. The port was accessed with a needle and aspirated and flushed with heparinized saline. The access needle was removed. The venotomy and port pocket incisions were closed with subcutaneous 3-0 Monocryl and  subcuticular 4-0 Vicryl. Dermabond was applied to both incisions. COMPLICATIONS: COMPLICATIONS None FINDINGS: After catheter placement, the tip lies at the cavo-atrial junction. The catheter aspirates normally and is ready for immediate use. IMPRESSION: Placement of single lumen port a cath via right internal jugular vein. The catheter tip lies at the cavo-atrial junction. A power injectable port a cath was placed and is ready for immediate use. Electronically Signed   By: Aletta Edouard M.D.   On: 12/23/2021 15:47    ASSESSMENT & PLAN Keith Moreno 61 y.o. male with medical history significant for localized esophageal adenocarcinoma who presents for a follow up visit.   After review the labs, review the records, discussion with the patient the findings are most consistent with a localized adenocarcinoma of the esophagus.  At this time our plan will consist of neoadjuvant chemoradiation followed by consideration of surgical resection.  The patient voices understanding of this plan moving forward.  We will plan to proceed with carbo paclitaxel weekly during his chemotherapy treatment.  PEG tube can be placed if the patient has marked difficulty swallowing.  Otherwise his labs are stable and he is a good candidate for treatment moving forward.  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 Stage IIB (cT2,cN0, cM0) --completed EUS and EBUS to finalize staging on 12/14/2021 --biopsy confirms moderate-poorly differentiated adenocarcinoma of the esophagus.  -- Port placed, chemotherapy education completed -- We will plan for surgical evaluation after the patient has completed neoadjuvant treatment. Plan:  --today is Cycle 1 Day 22 of Chemoradiation with Carboplatin/Paclitaxel --Labs today show white blood cell count 2.5, hemoglobin 11.5 platelets of 129, creatinine of 1.01 --continue with weekly chemotherapy while receiving radiation -- RTC in 2 weeks for continued  monitoring.   #Supportive Care -- chemotherapy education completed -- port placed -- zofran 57m q8H PRN and compazine 168mPO q6H for nausea -- EMLA cream for port -- no pain medication required at this time.   No orders of the defined types were placed in this encounter.   All questions were answered. The patient knows to call the clinic with any problems, questions or concerns.  A total of more than 30 minutes were spent on this encounter with face-to-face time and non-face-to-face time, including preparing to see the patient, ordering tests and/or medications, counseling the patient and coordination of care as outlined above.   JoLedell PeoplesMD Department of Hematology/Oncology [facility] [place]: [phone]: [phone]: [email or web address]  01/16/2022 1:19 PM

## 2022-01-16 NOTE — Progress Notes (Signed)
Nutrition follow-up completed with patient during infusion for esophageal cancer.   Patient receives concurrent chemotherapy and radiation treatments.  Weight decreased and documented as 340 pounds 11.2 ounces July 10.   This is decreased from 348 pounds 11.2 ounces June 26.   Patient has lost 7% of his body weight over 3 months.  Patient reports he continues to pure all of his food.  He had an episode of choking with soft foods and he really does not want to try to swallow any textures.  Noted gums are tender.  He states he is using baking soda and salt water rinses.  Reports concern with fasting blood sugars of 95-118.  He also reports a blood sugar of 160 2 hours after a meal.  He is taking his oral medications for his blood sugars.  He is consuming a higher fiber pured diet with added protein.  He is consuming bone broth and at least 4-16 ounces of bottled water daily.  He rarely consumes concentrated sweets.    Nutrition diagnosis: Unintentional weight loss continues.  Intervention: Continue soft/pured food intake based on tolerance. Brief education provided on no concentrated sweets diet. Okay to consume complex carbohydrates as long as they were blenderized and he can swallow them without difficulty. Assured him general guidelines for blood sugars during oncology treatment is 180 or below.  He was encouraged to contact his MD who manages his medications if he is concerned. Discouraged rapid weight loss. Continue increased fluids. Questions answered.  Teach back method used.  Monitoring, evaluation, goals: Patient will tolerate adequate calories and protein to minimize rapid weight loss.  Next visit: Encouraged patient to contact RD with further questions or concerns.  **Disclaimer: This note was dictated with voice recognition software. Similar sounding words can inadvertently be transcribed and this note may contain transcription errors which may not have been corrected upon  publication of note.**

## 2022-01-16 NOTE — Patient Instructions (Signed)
[facility] MEDICAL ONCOLOGY   Discharge Instructions: Thank you for choosing [facility] to provide your oncology and hematology care.   If you have a lab appointment with the Cancer Center, please go directly to the Cancer Center and check in at the registration area.   Wear comfortable clothing and clothing appropriate for easy access to any Portacath or PICC line.   We strive to give you quality time with your provider. You may need to reschedule your appointment if you arrive late (15 or more minutes).  Arriving late affects you and other patients whose appointments are after yours.  Also, if you miss three or more appointments without notifying the office, you may be dismissed from the clinic at the provider's discretion.      For prescription refill requests, have your pharmacy contact our office and allow 72 hours for refills to be completed.    Today you received the following chemotherapy and/or immunotherapy agents: paclitaxel and carboplatin      To help prevent nausea and vomiting after your treatment, we encourage you to take your nausea medication as directed.  BELOW ARE SYMPTOMS THAT SHOULD BE REPORTED IMMEDIATELY: *FEVER GREATER THAN 100.4 F (38 C) OR HIGHER *CHILLS OR SWEATING *NAUSEA AND VOMITING THAT IS NOT CONTROLLED WITH YOUR NAUSEA MEDICATION *UNUSUAL SHORTNESS OF BREATH *UNUSUAL BRUISING OR BLEEDING *URINARY PROBLEMS (pain or burning when urinating, or frequent urination) *BOWEL PROBLEMS (unusual diarrhea, constipation, pain near the anus) TENDERNESS IN MOUTH AND THROAT WITH OR WITHOUT PRESENCE OF ULCERS (sore throat, sores in mouth, or a toothache) UNUSUAL RASH, SWELLING OR PAIN  UNUSUAL VAGINAL DISCHARGE OR ITCHING   Items with * indicate a potential emergency and should be followed up as soon as possible or go to the Emergency Department if any problems should occur.  Please show the CHEMOTHERAPY ALERT CARD or IMMUNOTHERAPY ALERT  CARD at check-in to the Emergency Department and triage nurse.  Should you have questions after your visit or need to cancel or reschedule your appointment, please contact [facility]ICAL [facility]COL[facility]  Dept: [phone]  and follow the prompts.  Office hours are 8:00 a.m. to 4:30 p.m. Monday - Friday. Please note that voicemails left after 4:00 p.m. may not be returned until the following business day.  We are closed weekends and major holidays. You have access to a nurse at all times for urgent questions. Please call the main number to the clinic Dept: [phone] and follow the prompts.   For any non-urgent questions, you may also contact your provider using MyChart. We now offer e-Visits for anyone 18 and older to request care online for non-urgent symptoms. For details visit [email or web address].   Also download the MyChart app! Go to the app store, search "MyChart", open the app, select [facility], and log in with your MyChart username and password.  Masks are optional in the cancer centers. If you would like for your care team to wear a mask while they are taking care of you, please let them know. For doctor visits, patients may have with them one support person who is at least 61 years old. At this time, visitors are not allowed in the infusion area. 

## 2022-01-17 ENCOUNTER — Other Ambulatory Visit: Payer: Self-pay

## 2022-01-17 ENCOUNTER — Ambulatory Visit
Admission: RE | Admit: 2022-01-17 | Discharge: 2022-01-17 | Disposition: A | Discharge status: Home / Self Care | Payer: No Typology Code available for payment source | Source: Ambulatory Visit | Attending: Radiation Oncology | Admitting: Radiation Oncology

## 2022-01-17 DIAGNOSIS — Z510 Encounter for antineoplastic radiation therapy: Secondary | ICD-10-CM | POA: Diagnosis not present

## 2022-01-17 LAB — RAD ONC ARIA SESSION SUMMARY
Course Elapsed Days: 22
Plan Fractions Treated to Date: 16
Plan Prescribed Dose Per Fraction: 1.8 Gy
Plan Total Fractions Prescribed: 25
Plan Total Prescribed Dose: 45 Gy
Reference Point Dosage Given to Date: 28.8 Gy
Reference Point Session Dosage Given: 1.8 Gy
Session Number: 16

## 2022-01-18 ENCOUNTER — Other Ambulatory Visit: Payer: Self-pay

## 2022-01-18 ENCOUNTER — Ambulatory Visit
Admission: RE | Admit: 2022-01-18 | Discharge: 2022-01-18 | Disposition: A | Discharge status: Home / Self Care | Payer: No Typology Code available for payment source | Source: Ambulatory Visit | Attending: Radiation Oncology | Admitting: Radiation Oncology

## 2022-01-18 DIAGNOSIS — Z510 Encounter for antineoplastic radiation therapy: Secondary | ICD-10-CM | POA: Diagnosis not present

## 2022-01-18 LAB — RAD ONC ARIA SESSION SUMMARY
Course Elapsed Days: 23
Plan Fractions Treated to Date: 17
Plan Prescribed Dose Per Fraction: 1.8 Gy
Plan Total Fractions Prescribed: 25
Plan Total Prescribed Dose: 45 Gy
Reference Point Dosage Given to Date: 30.6 Gy
Reference Point Session Dosage Given: 1.8 Gy
Session Number: 17

## 2022-01-18 NOTE — Progress Notes (Signed)
[address]       [place] [address]             [phone]                    [place] Medical Record #[id number] Date of Birth: 1960/07/21  Referring: [facility] Care: [facility] Cardiologist: None  Chief Complaint:    Chief Complaint  Patient presents with   Esophageal Cancer    Initial surgical consult, Endo 5/7, PET 5/24, upper EUS 5/24    History of Present Illness:    Keith Moreno 62 y.o. 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  He has a several month history of dysphagia, and was seen in the emergency department for food impaction.  After the food was removed endoscopically the mass was noted, and biopsy.  He has already begun his neoadjuvant chemoradiation, and is scheduled for his last dose of radiation on July 27.  He has been on a pured diet since his diagnosis.  He has lost approximately 15 pounds during the course of his therapy.  He is a Administrator by trade, and has been unable to work.  He occasionally has shortness of breath with exertion.       Zubrod Score: At the time of surgery this patient's most appropriate activity status/level should be described as: '[]'$     0    Normal activity, no symptoms '[x]'$     1    Restricted in physical strenuous activity but ambulatory, able to do out light work '[]'$     2    Ambulatory and capable of self care, unable to do work activities, up and about               >50 % of waking hours                              '[]'$     3    Only limited self care, in bed greater than 50% of waking hours '[]'$     4    Completely disabled, no self care, confined to bed or chair '[]'$     5    Moribund   Past Medical History:  Diagnosis Date   Anxiety    COVID 2022   mild case   History of kidney stones    Hypertension    Kidney infection    at age 89   Pneumonia    9 years ago   Pre-diabetes    PTSD  (post-traumatic stress disorder)     Past Surgical History:  Procedure Laterality Date   BIOPSY  11/13/2021   Procedure: BIOPSY;  Surgeon: Jerene Bears, MD;  Location: [facility];  Service: Gastroenterology;;   BIOPSY  12/14/2021   Procedure: BIOPSY;  Surgeon: Irving Copas., MD;  Location[facility] ENDOSCOPY;  Service: Gastroenterology;;   BRONCHIAL BRUSHINGS  12/14/2021   Procedure: BRONCHIAL BRUSHINGS;  Surgeon: Collene Gobble, MD;  Location: [facility];  Service: Cardiopulmonary;;   BRONCHIAL NEEDLE ASPIRATION BIOPSY  12/14/2021   Procedure: BRONCHIAL NEEDLE ASPIRATION BIOPSIES;  Surgeon: Collene Gobble, MD;  Location: [facility] ENDOSCOPY;  Service: Cardiopulmonary;;   ESOPHAGOGASTRODUODENOSCOPY N/A 11/13/2021   Procedure: ESOPHAGOGASTRODUODENOSCOPY (EGD);  Surgeon: Jerene Bears, MD;  Location: [facility] ENDOSCOPY;  Service: Gastroenterology;  Laterality: N/A;   ESOPHAGOGASTRODUODENOSCOPY (EGD) WITH PROPOFOL N/A 12/14/2021  Procedure: ESOPHAGOGASTRODUODENOSCOPY (EGD) WITH PROPOFOL;  Surgeon: Rush Landmark Telford Nab., MD;  Location: [place] [place];  Service: Gastroenterology;  Laterality: N/A;   EUS N/A 12/14/2021   Procedure: UPPER ENDOSCOPIC ULTRASOUND (EUS) LINEAR;  Surgeon: Irving Copas., MD;  Location: [facility];  Service: Gastroenterology;  Laterality: N/A;   FINE NEEDLE ASPIRATION  12/14/2021   Procedure: FINE NEEDLE ASPIRATION (FNA) LINEAR;  Surgeon: Irving Copas., MD;  Location: [facility];  Service: Gastroenterology;;   FOREIGN BODY REMOVAL  11/13/2021   Procedure: FOREIGN BODY REMOVAL;  Surgeon: Jerene Bears, MD;  Location: [facility];  Service: Gastroenterology;;   HUMERUS FRACTURE SURGERY Right    IR IMAGING GUIDED PORT INSERTION  12/23/2021   TONSILLECTOMY AND ADENOIDECTOMY     VIDEO BRONCHOSCOPY WITH ENDOBRONCHIAL ULTRASOUND Bilateral 12/14/2021   Procedure: VIDEO BRONCHOSCOPY WITH ENDOBRONCHIAL ULTRASOUND;  Surgeon: Collene Gobble, MD;  Location: [place] [facility];   Service: Cardiopulmonary;  Laterality: Bilateral;    Family History  Problem Relation Age of Onset   Hypertension Mother    Diabetes Mother    Bipolar disorder Mother    Hypertension Father    Kidney cancer Sister    Colon cancer Paternal Uncle    Cancer Cousin    Bladder Cancer Cousin    Cancer Cousin      Social History   Tobacco Use  Smoking Status Former   Types: Cigars, Cigarettes, Pipe   Quit date: 1983   Years since quitting: 40.5  Smokeless Tobacco Former   Types: Chew   Quit date: 1983    Social History   Substance and Sexual Activity  Alcohol Use Yes   Comment: Occasional     Allergies  Allergen Reactions   Procaine     Other reaction(s): Other, Respiratory Distress   Chocolate     Hyperactivity, respiratory distress   Other     Novocaine- respiratory distress    Current Outpatient Medications  Medication Sig Dispense Refill   Alogliptin Benzoate 25 MG TABS Take 12.5 mg by mouth at bedtime.     amLODipine (NORVASC) 10 MG tablet Take 1 tablet by mouth at bedtime.     Ascorbic Acid (VITAMIN C) 1000 MG tablet Take 2,000 mg by mouth every morning.     Cholecalciferol (VITAMIN D3) 50 MCG (2000 UT) capsule Take 20,000 Units by mouth at bedtime. 10 gel caps     CINNAMON PO Take 4,000 mg by mouth 2 (two) times daily. 1000 mg each     diphenhydramine-acetaminophen (TYLENOL PM) 25-500 MG TABS tablet Take 2 tablets by mouth at bedtime. 14 tablet    Krill Oil 350 MG CAPS Take 1,050 mg by mouth at bedtime.     lidocaine (LIDODERM) 5 % Place 1 patch onto the skin as needed.     lidocaine-prilocaine (EMLA) cream Apply 1 application. topically as needed. 30 g 0   lisinopril (PRINIVIL,ZESTRIL) 40 MG tablet Take 40 mg by mouth at bedtime.     MAGNESIUM CITRATE PO Take 100 mg by mouth at bedtime.     Melatonin 10 MG TABS Take 40 mg by mouth at bedtime as needed (Sleep).     olopatadine (PATANOL) 0.1 % ophthalmic solution Place 1 drop into both eyes 2 (two) times  daily.     ondansetron (ZOFRAN-ODT) 8 MG disintegrating tablet Take 1 tablet (8 mg total) by mouth every 8 (eight) hours as needed for nausea or vomiting. 90 tablet 3   pioglitazone (ACTOS) 30 MG tablet Take 30 mg by  mouth every morning.     Potassium 99 MG TABS Take 198 mg by mouth daily.     prochlorperazine (COMPAZINE) 10 MG tablet Take 1 tablet (10 mg total) by mouth every 6 (six) hours as needed for nausea or vomiting. 90 tablet 3   vitamin E 180 MG (400 UNITS) capsule Take 1,200 Units by mouth 2 (two) times daily. Gel caps     No current facility-administered medications for this visit.    Review of Systems  Constitutional:  Positive for malaise/fatigue and weight loss. Negative for fever.  Respiratory:  Positive for shortness of breath.   Cardiovascular:  Negative for chest pain.  Gastrointestinal:  Positive for nausea.  Neurological: Negative.      PHYSICAL EXAMINATION: BP 122/82 (BP Location: Left Arm, Patient Position: Sitting)   Pulse 91   Resp 20   Ht 6' (1.829 m)   Wt (!) 341 lb (154.7 kg)   SpO2 98% Comment: RA  BMI 46.25 kg/m  Physical Exam Constitutional:      General: He is not in acute distress.    Appearance: He is obese. He is ill-appearing. He is not toxic-appearing.  HENT:     Head: Normocephalic and atraumatic.  Eyes:     Extraocular Movements: Extraocular movements intact.  Cardiovascular:     Rate and Rhythm: Normal rate.  Pulmonary:     Effort: Pulmonary effort is normal. No respiratory distress.  Abdominal:     General: There is no distension.  Musculoskeletal:        General: Normal range of motion.     Cervical back: Normal range of motion.  Skin:    General: Skin is warm and dry.  Neurological:     General: No focal deficit present.     Mental Status: He is alert and oriented to person, place, and time.     Diagnostic Studies & Laboratory data:     PET/CT: 11/30/21 IMPRESSION: Signs of esophageal uptake and thickening in this  patient with known esophageal neoplasm likely represents the site of disease.   Heterogeneity and potential thickening near the gastric fundus in the setting of small hiatal hernia is nonspecific and potentially physiologic. However, in light of distal esophageal neoplasm would correlate with the recent endoscopic results to ensure no gastric abnormality.   Diffuse mediastinal adenopathy, areas imaged on prior studies are similar and many areas with calcification, pattern of adenopathy and uptake would favor process such as sarcoidosis. Unfortunately this confounds assessment for metastatic disease. The most suspicious node currently is a juxta crural lymph node on the RIGHT adjacent to the celiac axis though again this could be related to underlying sarcoidosis.        EGD/EUS:  EGD Impression: - No gross lesions in esophagus proximally. - Esophageal mucosal changes consistent with long-segment Barrett's esophagus with notation of a partially obstructing, malignant esophageal tumor was found in the distal esophagus. I could only traverse EGD and radial EGD scope through this area, linear EUS could not traverse the mass. - 2 cm hiatal hernia. - Erosive gastropathy with no bleeding and no stigmata of recent bleeding in antrum. No gross lesions in the stomach. Biopsied. - No gross lesions in the duodenal bulb, in the first portion of the duodenum and in the second portion of the duodenum. EUS Impression: - Two malignant-appearing lymph nodes were visualized in the diaphragmatic region (level 15) but due to their location with the known cancer could not be biopsied. Multiple enlarged lymph nodes  were visualized in the middle paraesophageal mediastinum (level 9M) and lower paraesophageal mediastinum (level 8L). Fine needle biopsy performed of the lesion that could be reached without traversing the malignancy. - A mass was found in the thoracic esophagus extending to the  gastroesophageal junction. A tissue diagnosis was obtained prior to this exam. This is consistent with adenocarcinoma. This was staged T3 Nx Mx by endosonographic criteria. The staging applies if malignancy is confirmed. The nodal staging is as a result of what is noted above and why we cannot put a number to this.   Path: FINAL MICROSCOPIC DIAGNOSIS:   A. ESOPHAGUS TUMOR, DISTAL, BIOPSY:  - Moderately to poorly differentiated adenocarcinoma.  - See comment.   FINAL MICROSCOPIC DIAGNOSIS:  A. LUNG, LUL, BRUSHING:  - No malignant cells identified  - Benign reactive/reparative changes  B. LYMPH NODE, 4R, FINE NEEDLE ASPIRATION:  - No malignant cells identified  C. LYMPH NODE, 4L, FINE NEEDLE ASPIRATION:  - No malignant cells identified  D. LYMPH NODE, STATION 7, FINE NEEDLE ASPIRATION:  - No malignant cells identified   Radiation Hx: Last treatment on 01/06/22     I have independently reviewed the above radiology studies  and reviewed the findings with the patient.   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  He also has significant mediastinal adenopathy around level 7.  This has been biopsied, and was not consistent with metastatic disease.  Based off of the pattern it is consistent with sarcoid, especially with the calcifications are present.  We discussed the risks and benefits of robotic assisted Ivor Lewis esophagectomy with  jejunostomy feeding tube.  I explained to him that my concerns are the presence of his mediastinal adenopathy, and its implications thoracic dissection.  He is also morbidly obese, but has not lost much weight during his neoadjuvant therapy.  He is scheduled to receive his last dose of radiation on 7/27.  We will obtain a repeat PET/CT in a month after that.  His operative window will be between 6 and 10 weeks after his last dose of radiation.  I will follow-up with him to discuss the results of his repeat PET/CT.      I  spent 55 minutes with the patient face to face counseling and coordination of care.    Lajuana Matte 01/20/2022 11:24 AM

## 2022-01-19 ENCOUNTER — Ambulatory Visit
Admission: RE | Admit: 2022-01-19 | Discharge: 2022-01-19 | Disposition: A | Discharge status: Home / Self Care | Payer: No Typology Code available for payment source | Source: Ambulatory Visit | Attending: Radiation Oncology | Admitting: Radiation Oncology

## 2022-01-19 ENCOUNTER — Other Ambulatory Visit: Payer: Self-pay

## 2022-01-19 DIAGNOSIS — Z510 Encounter for antineoplastic radiation therapy: Secondary | ICD-10-CM | POA: Diagnosis not present

## 2022-01-19 LAB — RAD ONC ARIA SESSION SUMMARY
Course Elapsed Days: 24
Plan Fractions Treated to Date: 18
Plan Prescribed Dose Per Fraction: 1.8 Gy
Plan Total Fractions Prescribed: 25
Plan Total Prescribed Dose: 45 Gy
Reference Point Dosage Given to Date: 32.4 Gy
Reference Point Session Dosage Given: 1.8 Gy
Session Number: 18

## 2022-01-20 ENCOUNTER — Other Ambulatory Visit: Payer: Self-pay

## 2022-01-20 ENCOUNTER — Ambulatory Visit
Admission: RE | Admit: 2022-01-20 | Discharge: 2022-01-20 | Disposition: A | Discharge status: Home / Self Care | Payer: No Typology Code available for payment source | Source: Ambulatory Visit | Attending: Radiation Oncology | Admitting: Radiation Oncology

## 2022-01-20 ENCOUNTER — Other Ambulatory Visit: Payer: Self-pay | Admitting: Thoracic Surgery (Cardiothoracic Vascular Surgery)

## 2022-01-20 ENCOUNTER — Institutional Professional Consult (permissible substitution) (INDEPENDENT_AMBULATORY_CARE_PROVIDER_SITE_OTHER): Payer: No Typology Code available for payment source | Admitting: Thoracic Surgery (Cardiothoracic Vascular Surgery)

## 2022-01-20 VITALS — BP 122/82 | HR 91 | Resp 20 | Ht 72.0 in | Wt 341.0 lb

## 2022-01-20 DIAGNOSIS — C159 Malignant neoplasm of esophagus, unspecified: Secondary | ICD-10-CM

## 2022-01-20 DIAGNOSIS — Z510 Encounter for antineoplastic radiation therapy: Secondary | ICD-10-CM | POA: Diagnosis not present

## 2022-01-20 LAB — RAD ONC ARIA SESSION SUMMARY
Course Elapsed Days: 25
Plan Fractions Treated to Date: 19
Plan Prescribed Dose Per Fraction: 1.8 Gy
Plan Total Fractions Prescribed: 25
Plan Total Prescribed Dose: 45 Gy
Reference Point Dosage Given to Date: 34.2 Gy
Reference Point Session Dosage Given: 1.8 Gy
Session Number: 19

## 2022-01-20 MED FILL — Dexamethasone Sodium Phosphate Inj 100 MG/10ML: INTRAMUSCULAR | Qty: 1 | Status: AC

## 2022-01-23 ENCOUNTER — Inpatient Hospital Stay: Payer: No Typology Code available for payment source

## 2022-01-23 ENCOUNTER — Ambulatory Visit
Admission: RE | Admit: 2022-01-23 | Discharge: 2022-01-23 | Disposition: A | Discharge status: Home / Self Care | Payer: No Typology Code available for payment source | Source: Ambulatory Visit | Attending: Radiation Oncology | Admitting: Radiation Oncology

## 2022-01-23 ENCOUNTER — Other Ambulatory Visit: Payer: Self-pay

## 2022-01-23 VITALS — BP 148/95 | HR 95 | Temp 98.1°F | Resp 18 | Wt 343.5 lb

## 2022-01-23 DIAGNOSIS — Z5111 Encounter for antineoplastic chemotherapy: Secondary | ICD-10-CM | POA: Diagnosis not present

## 2022-01-23 DIAGNOSIS — C159 Malignant neoplasm of esophagus, unspecified: Secondary | ICD-10-CM

## 2022-01-23 DIAGNOSIS — Z510 Encounter for antineoplastic radiation therapy: Secondary | ICD-10-CM | POA: Diagnosis not present

## 2022-01-23 DIAGNOSIS — Z95828 Presence of other vascular implants and grafts: Secondary | ICD-10-CM

## 2022-01-23 LAB — RAD ONC ARIA SESSION SUMMARY
Course Elapsed Days: 28
Plan Fractions Treated to Date: 20
Plan Prescribed Dose Per Fraction: 1.8 Gy
Plan Total Fractions Prescribed: 25
Plan Total Prescribed Dose: 45 Gy
Reference Point Dosage Given to Date: 36 Gy
Reference Point Session Dosage Given: 1.8 Gy
Session Number: 20

## 2022-01-23 LAB — CBC WITH DIFFERENTIAL (CANCER CENTER ONLY)
Abs Immature Granulocytes: 0.01 10*3/uL (ref 0.00–0.07)
Basophils Absolute: 0 10*3/uL (ref 0.0–0.1)
Basophils Relative: 1 %
Eosinophils Absolute: 0 10*3/uL (ref 0.0–0.5)
Eosinophils Relative: 0 %
HCT: 30.4 % — ABNORMAL LOW (ref 39.0–52.0)
Hemoglobin: 10.5 g/dL — ABNORMAL LOW (ref 13.0–17.0)
Immature Granulocytes: 1 %
Lymphocytes Relative: 2 %
Lymphs Abs: 0 10*3/uL — ABNORMAL LOW (ref 0.7–4.0)
MCH: 30.1 pg (ref 26.0–34.0)
MCHC: 34.5 g/dL (ref 30.0–36.0)
MCV: 87.1 fL (ref 80.0–100.0)
Monocytes Absolute: 0.1 10*3/uL (ref 0.1–1.0)
Monocytes Relative: 5 %
Neutro Abs: 2 10*3/uL (ref 1.7–7.7)
Neutrophils Relative %: 91 %
Platelet Count: 73 10*3/uL — ABNORMAL LOW (ref 150–400)
RBC: 3.49 MIL/uL — ABNORMAL LOW (ref 4.22–5.81)
RDW: 14.7 % (ref 11.5–15.5)
WBC Count: 2.2 10*3/uL — ABNORMAL LOW (ref 4.0–10.5)
nRBC: 0 % (ref 0.0–0.2)

## 2022-01-23 LAB — CMP (CANCER CENTER ONLY)
ALT: 13 U/L (ref 0–44)
AST: 14 U/L — ABNORMAL LOW (ref 15–41)
Albumin: 3.6 g/dL (ref 3.5–5.0)
Alkaline Phosphatase: 45 U/L (ref 38–126)
Anion gap: 3 — ABNORMAL LOW (ref 5–15)
BUN: 24 mg/dL — ABNORMAL HIGH (ref 8–23)
CO2: 27 mmol/L (ref 22–32)
Calcium: 10.3 mg/dL (ref 8.9–10.3)
Chloride: 104 mmol/L (ref 98–111)
Creatinine: 0.92 mg/dL (ref 0.61–1.24)
GFR, Estimated: >60 mL/min (ref >60)
Glucose, Bld: 139 mg/dL — ABNORMAL HIGH (ref 70–99)
Potassium: 4.2 mmol/L (ref 3.5–5.1)
Sodium: 134 mmol/L — ABNORMAL LOW (ref 135–145)
Total Bilirubin: 0.4 mg/dL (ref 0.3–1.2)
Total Protein: 6.1 g/dL — ABNORMAL LOW (ref 6.5–8.1)

## 2022-01-23 LAB — MAGNESIUM: Magnesium: 1.6 mg/dL — ABNORMAL LOW (ref 1.7–2.4)

## 2022-01-23 MED ORDER — SODIUM CHLORIDE 0.9% FLUSH
10.0000 mL | INTRAVENOUS | Status: DC | PRN
  Administered 2022-01-23: 10 mL

## 2022-01-23 MED ORDER — SODIUM CHLORIDE 0.9 % IV SOLN
50.0000 mg/m2 | Freq: Once | INTRAVENOUS | Status: AC
  Administered 2022-01-23: 144 mg via INTRAVENOUS
  Filled 2022-01-23: qty 24

## 2022-01-23 MED ORDER — SODIUM CHLORIDE 0.9 % IV SOLN
10.0000 mg | Freq: Once | INTRAVENOUS | Status: AC
  Administered 2022-01-23: 10 mg via INTRAVENOUS
  Filled 2022-01-23: qty 10

## 2022-01-23 MED ORDER — PALONOSETRON HCL INJECTION 0.25 MG/5ML
0.2500 mg | Freq: Once | INTRAVENOUS | Status: AC
  Administered 2022-01-23: 0.25 mg via INTRAVENOUS
  Filled 2022-01-23: qty 5

## 2022-01-23 MED ORDER — DIPHENHYDRAMINE HCL 50 MG/ML IJ SOLN
50.0000 mg | Freq: Once | INTRAMUSCULAR | Status: AC
  Administered 2022-01-23: 50 mg via INTRAVENOUS
  Filled 2022-01-23: qty 1

## 2022-01-23 MED ORDER — FAMOTIDINE IN NACL 20-0.9 MG/50ML-% IV SOLN
20.0000 mg | Freq: Once | INTRAVENOUS | Status: AC
  Administered 2022-01-23: 20 mg via INTRAVENOUS
  Filled 2022-01-23: qty 50

## 2022-01-23 MED ORDER — SODIUM CHLORIDE 0.9 % IV SOLN: Freq: Once | INTRAVENOUS | Status: AC

## 2022-01-23 MED ORDER — SODIUM CHLORIDE 0.9% FLUSH
10.0000 mL | Freq: Once | INTRAVENOUS | Status: AC
  Administered 2022-01-23: 10 mL

## 2022-01-23 MED ORDER — HEPARIN SOD (PORK) LOCK FLUSH 100 UNIT/ML IV SOLN
500.0000 [IU] | Freq: Once | INTRAVENOUS | Status: AC | PRN
  Administered 2022-01-23: 500 [IU]

## 2022-01-23 MED ORDER — SODIUM CHLORIDE 0.9 % IV SOLN
300.0000 mg | Freq: Once | INTRAVENOUS | Status: AC
  Administered 2022-01-23: 300 mg via INTRAVENOUS
  Filled 2022-01-23: qty 30

## 2022-01-23 NOTE — Progress Notes (Signed)
Okay to treat with Plt 73 for D29C1 Taxol/Carbo per Dr. Lorenso Courier

## 2022-01-23 NOTE — Patient Instructions (Signed)
[facility] ONCOLOGY  Discharge Instructions: Thank you for choosing [facility] to provide your oncology and hematology care.   If you have a lab appointment with the [facility], please go directly to the [facility] and check in at the registration area.   Wear comfortable clothing and clothing appropriate for easy access to any Portacath or PICC line.   We strive to give you quality time with your provider. You may need to reschedule your appointment if you arrive late (15 or more minutes).  Arriving late affects you and other patients whose appointments are after yours.  Also, if you miss three or more appointments without notifying the office, you may be dismissed from the clinic at the provider's discretion.      For prescription refill requests, have your pharmacy contact our office and allow 72 hours for refills to be completed.    Today you received the following chemotherapy and/or immunotherapy agents : Taxol, carboplatin      To help prevent nausea and vomiting after your treatment, we encourage you to take your nausea medication as directed.  BELOW ARE SYMPTOMS THAT SHOULD BE REPORTED IMMEDIATELY: *FEVER GREATER THAN 100.4 F (38 C) OR HIGHER *CHILLS OR SWEATING *NAUSEA AND VOMITING THAT IS NOT CONTROLLED WITH YOUR NAUSEA MEDICATION *UNUSUAL SHORTNESS OF BREATH *UNUSUAL BRUISING OR BLEEDING *URINARY PROBLEMS (pain or burning when urinating, or frequent urination) *BOWEL PROBLEMS (unusual diarrhea, constipation, pain near the anus) TENDERNESS IN MOUTH AND THROAT WITH OR WITHOUT PRESENCE OF ULCERS (sore throat, sores in mouth, or a toothache) UNUSUAL RASH, SWELLING OR PAIN  UNUSUAL VAGINAL DISCHARGE OR ITCHING   Items with * indicate a potential emergency and should be followed up as soon as possible or go to the Emergency Department if any problems should occur.  Please show the CHEMOTHERAPY ALERT CARD or IMMUNOTHERAPY ALERT CARD at  check-in to the Emergency Department and triage nurse.  Should you have questions after your visit or need to cancel or reschedule your appointment, please contact [facility]  Dept: [phone]  and follow the prompts.  Office hours are 8:00 a.m. to 4:30 p.m. Monday - Friday. Please note that voicemails left after 4:00 p.m. may not be returned until the following business day.  We are closed weekends and major holidays. You have access to a nurse at all times for urgent questions. Please call the main number to the clinic Dept: [phone] and follow the prompts.   For any non-urgent questions, you may also contact your provider using MyChart. We now offer e-Visits for anyone 58 and older to request care online for non-urgent symptoms. For details visit [email or web address].   Also download the MyChart app! Go to the app store, search "MyChart", open the app, select [facility], and log in with your MyChart username and password.  Masks are optional in the cancer centers. If you would like for your care team to wear a mask while they are taking care of you, please let them know. For doctor visits, patients may have with them one support person who is at least 61 years old. At this time, visitors are not allowed in the infusion area.

## 2022-01-24 ENCOUNTER — Other Ambulatory Visit: Payer: Self-pay

## 2022-01-24 ENCOUNTER — Ambulatory Visit
Admission: RE | Admit: 2022-01-24 | Discharge: 2022-01-24 | Disposition: A | Discharge status: Home / Self Care | Payer: No Typology Code available for payment source | Source: Ambulatory Visit | Attending: Radiation Oncology | Admitting: Radiation Oncology

## 2022-01-24 DIAGNOSIS — Z510 Encounter for antineoplastic radiation therapy: Secondary | ICD-10-CM | POA: Diagnosis not present

## 2022-01-24 LAB — RAD ONC ARIA SESSION SUMMARY
Course Elapsed Days: 29
Plan Fractions Treated to Date: 21
Plan Prescribed Dose Per Fraction: 1.8 Gy
Plan Total Fractions Prescribed: 25
Plan Total Prescribed Dose: 45 Gy
Reference Point Dosage Given to Date: 37.8 Gy
Reference Point Session Dosage Given: 1.8 Gy
Session Number: 21

## 2022-01-25 ENCOUNTER — Other Ambulatory Visit: Payer: Self-pay

## 2022-01-25 ENCOUNTER — Ambulatory Visit
Admission: RE | Admit: 2022-01-25 | Discharge: 2022-01-25 | Disposition: A | Discharge status: Home / Self Care | Payer: No Typology Code available for payment source | Source: Ambulatory Visit | Attending: Radiation Oncology | Admitting: Radiation Oncology

## 2022-01-25 DIAGNOSIS — Z510 Encounter for antineoplastic radiation therapy: Secondary | ICD-10-CM | POA: Diagnosis not present

## 2022-01-25 LAB — RAD ONC ARIA SESSION SUMMARY
Course Elapsed Days: 30
Plan Fractions Treated to Date: 22
Plan Prescribed Dose Per Fraction: 1.8 Gy
Plan Total Fractions Prescribed: 25
Plan Total Prescribed Dose: 45 Gy
Reference Point Dosage Given to Date: 39.6 Gy
Reference Point Session Dosage Given: 1.8 Gy
Session Number: 22

## 2022-01-26 ENCOUNTER — Other Ambulatory Visit: Payer: Self-pay

## 2022-01-26 ENCOUNTER — Ambulatory Visit
Admission: RE | Admit: 2022-01-26 | Discharge: 2022-01-26 | Disposition: A | Discharge status: Home / Self Care | Payer: No Typology Code available for payment source | Source: Ambulatory Visit | Attending: Radiation Oncology | Admitting: Radiation Oncology

## 2022-01-26 DIAGNOSIS — Z510 Encounter for antineoplastic radiation therapy: Secondary | ICD-10-CM | POA: Diagnosis not present

## 2022-01-26 LAB — RAD ONC ARIA SESSION SUMMARY
Course Elapsed Days: 31
Plan Fractions Treated to Date: 23
Plan Prescribed Dose Per Fraction: 1.8 Gy
Plan Total Fractions Prescribed: 25
Plan Total Prescribed Dose: 45 Gy
Reference Point Dosage Given to Date: 41.4 Gy
Reference Point Session Dosage Given: 1.8 Gy
Session Number: 23

## 2022-01-27 ENCOUNTER — Ambulatory Visit: Admission: RE | Admit: 2022-01-27 | Payer: No Typology Code available for payment source | Source: Ambulatory Visit

## 2022-01-27 ENCOUNTER — Ambulatory Visit
Admission: RE | Admit: 2022-01-27 | Discharge: 2022-01-27 | Disposition: A | Discharge status: Home / Self Care | Payer: No Typology Code available for payment source | Source: Ambulatory Visit | Attending: Radiation Oncology | Admitting: Radiation Oncology

## 2022-01-27 ENCOUNTER — Other Ambulatory Visit: Payer: Self-pay

## 2022-01-27 DIAGNOSIS — Z510 Encounter for antineoplastic radiation therapy: Secondary | ICD-10-CM | POA: Diagnosis not present

## 2022-01-27 LAB — RAD ONC ARIA SESSION SUMMARY
Course Elapsed Days: 32
Plan Fractions Treated to Date: 24
Plan Prescribed Dose Per Fraction: 1.8 Gy
Plan Total Fractions Prescribed: 25
Plan Total Prescribed Dose: 45 Gy
Reference Point Dosage Given to Date: 43.2 Gy
Reference Point Session Dosage Given: 1.8 Gy
Session Number: 24

## 2022-01-27 MED FILL — Dexamethasone Sodium Phosphate Inj 100 MG/10ML: INTRAMUSCULAR | Qty: 1 | Status: AC

## 2022-01-29 DIAGNOSIS — Z510 Encounter for antineoplastic radiation therapy: Secondary | ICD-10-CM | POA: Diagnosis not present

## 2022-01-29 NOTE — Progress Notes (Signed)
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 Stage IIB (cT2,cN0, cM0) 11/13/2021: Presented to the emergency department complaining of dysphagia and food impaction. Underwent EGD with Dr. Hilarie Fredrickson. Findings revealed medium sized fungating mass in the lower third of th esophagus, 35 cm from the incisors and extending into the GE junction.   It appears to have arisen in the background of Barrett's esophagus, the mass was nonobstructing and partially circumferential. Pathology confirmed moderately to poorly differentiated adenocarcinoma.  11/30/2021: PET scan showed  hypermetabolic activity in the distal esophagus with an SUV of 7.7 with thickening measuring up to 15 mm and spanning just to above the GE junction.  Diffuse mediastinal adenopathy showed dense calcification in all nearly symmetric findings.  12/06/2021: Established care with Radiation Oncology with Dr. Kyung Rudd and Shona Simpson PA-C 12/12/2021: Establish care with Medication Oncology with Dr. Narda Rutherford and Dede Query PA-C Cycle 1 Day 1 of Carbo/Paclitaxel with radiation.  End chemotherapy on 01/23/2022 (last dose on 01/30/2022 held due to neutropenia). Last radiation 02/02/2022.   Interval History:  Keith Moreno 61 y.o. male with medical history significant for localized esophageal adenocarcinoma who presents for a follow up visit. The patient's last visit was on 01/16/2022. In the interim since the last visit he has continued chemoradiation therapy.  On exam today Keith Moreno is accompanied by his wife.  He reports reports he tolerated  his last chemotherapy quite well.  He did have some nausea which responded well to his antinausea medications.  He reports he is more sensitive to smells.  He notes he has not had any vomiting or diarrhea.  He continues to take stool softener with senna docusate.  He had a nosebleed yesterday and one on Friday which took about 2 hours to stop.  He notes that swallowing is becoming slightly more difficult though he continues to eat a pured food diet.  Overall he is willing and able to proceed with treatment at this time.  He otherwise denies any fevers, chills, sweats, vomiting or diarrhea.  A full 10 point ROS was otherwise negative.  MEDICAL HISTORY:  Past Medical History:  Diagnosis Date   Anxiety    COVID 2022   mild case   History of kidney stones    Hypertension    Kidney infection    at age 47   Pneumonia    9 years ago   Pre-diabetes    PTSD (post-traumatic stress disorder)     SURGICAL HISTORY: Past Surgical History:  Procedure Laterality Date   BIOPSY  11/13/2021   Procedure: BIOPSY;  Surgeon: Jerene Bears, MD;  Location: [facility];  Service: Gastroenterology;;   BIOPSY  12/14/2021   Procedure: BIOPSY;  Surgeon: Irving Copas., MD;  Location[facility] ENDOSCOPY;  Service: Gastroenterology;;   BRONCHIAL BRUSHINGS  12/14/2021   Procedure: BRONCHIAL BRUSHINGS;  Surgeon: Collene Gobble, MD;  Location: [place];  Service: Cardiopulmonary;;   BRONCHIAL NEEDLE ASPIRATION BIOPSY  12/14/2021   Procedure: BRONCHIAL NEEDLE ASPIRATION BIOPSIES;  Surgeon: Collene Gobble, MD;  Location: [facility] ENDOSCOPY;  Service: Cardiopulmonary;;   ESOPHAGOGASTRODUODENOSCOPY N/A 11/13/2021   Procedure: ESOPHAGOGASTRODUODENOSCOPY (EGD);  Surgeon: Zenovia Jarred  M, MD;  Location: [place];  Service: Gastroenterology;  Laterality: N/A;   ESOPHAGOGASTRODUODENOSCOPY (EGD) WITH PROPOFOL N/A 12/14/2021   Procedure: ESOPHAGOGASTRODUODENOSCOPY (EGD) WITH PROPOFOL;  Surgeon: Rush Landmark Telford Nab., MD;   Location: [place];  Service: Gastroenterology;  Laterality: N/A;   EUS N/A 12/14/2021   Procedure: UPPER ENDOSCOPIC ULTRASOUND (EUS) LINEAR;  Surgeon: Irving Copas., MD;  Location: [place];  Service: Gastroenterology;  Laterality: N/A;   FINE NEEDLE ASPIRATION  12/14/2021   Procedure: FINE NEEDLE ASPIRATION (FNA) LINEAR;  Surgeon: Irving Copas., MD;  Location: [place];  Service: Gastroenterology;;   FOREIGN BODY REMOVAL  11/13/2021   Procedure: FOREIGN BODY REMOVAL;  Surgeon: Jerene Bears, MD;  Location: [place];  Service: Gastroenterology;;   HUMERUS FRACTURE SURGERY Right    IR IMAGING GUIDED PORT INSERTION  12/23/2021   TONSILLECTOMY AND ADENOIDECTOMY     VIDEO BRONCHOSCOPY WITH ENDOBRONCHIAL ULTRASOUND Bilateral 12/14/2021   Procedure: VIDEO BRONCHOSCOPY WITH ENDOBRONCHIAL ULTRASOUND;  Surgeon: Collene Gobble, MD;  Location: [place];  Service: Cardiopulmonary;  Laterality: Bilateral;    SOCIAL HISTORY: Social History   Socioeconomic History   Marital status: Single    Spouse name: Not on file   Number of children: Not on file   Years of education: Not on file   Highest education level: Not on file  Occupational History   Not on file  Tobacco Use   Smoking status: Former    Types: Cigars, Cigarettes, Pipe    Quit date: 11    Years since quitting: 40.5   Smokeless tobacco: Former    Types: Chew    Quit date: 1983  Vaping Use   Vaping Use: Never used  Substance and Sexual Activity   Alcohol use: Yes    Comment: Occasional   Drug use: No   Sexual activity: Not on file  Other Topics Concern   Not on file  Social History Narrative   Not on file   Social Determinants of Health   Financial Resource Strain: Not on file  Food Insecurity: Not on file  Transportation Needs: Not on file  Physical Activity: Not on file  Stress: Not on file  Social Connections: Not on file  Intimate Partner Violence: Not on file    FAMILY  HISTORY: Family History  Problem Relation Age of Onset   Hypertension Mother    Diabetes Mother    Bipolar disorder Mother    Hypertension Father    Kidney cancer Sister    Colon cancer Paternal Uncle    Cancer Cousin    Bladder Cancer Cousin    Cancer Cousin     ALLERGIES:  is allergic to procaine, chocolate, and other.  MEDICATIONS:  Current Outpatient Medications  Medication Sig Dispense Refill   Alogliptin Benzoate 25 MG TABS Take 12.5 mg by mouth at bedtime.     amLODipine (NORVASC) 10 MG tablet Take 1 tablet by mouth at bedtime.     Ascorbic Acid (VITAMIN C) 1000 MG tablet Take 2,000 mg by mouth every morning.     Cholecalciferol (VITAMIN D3) 50 MCG (2000 UT) capsule Take 20,000 Units by mouth at bedtime. 10 gel caps     CINNAMON PO Take 4,000 mg by mouth 2 (two) times daily. 1000 mg each     diphenhydramine-acetaminophen (TYLENOL PM) 25-500 MG TABS tablet Take 2 tablets by mouth at bedtime. 14 tablet    Krill Oil 350 MG CAPS Take 1,050 mg by mouth at bedtime.  lidocaine (LIDODERM) 5 % Place 1 patch onto the skin as needed.     lidocaine-prilocaine (EMLA) cream Apply 1 application. topically as needed. 30 g 0   lisinopril (PRINIVIL,ZESTRIL) 40 MG tablet Take 40 mg by mouth at bedtime.     MAGNESIUM CITRATE PO Take 100 mg by mouth at bedtime.     Melatonin 10 MG TABS Take 40 mg by mouth at bedtime as needed (Sleep).     olopatadine (PATANOL) 0.1 % ophthalmic solution Place 1 drop into both eyes 2 (two) times daily.     ondansetron (ZOFRAN-ODT) 8 MG disintegrating tablet Take 1 tablet (8 mg total) by mouth every 8 (eight) hours as needed for nausea or vomiting. 90 tablet 3   pioglitazone (ACTOS) 30 MG tablet Take 30 mg by mouth every morning.     Potassium 99 MG TABS Take 198 mg by mouth daily.     prochlorperazine (COMPAZINE) 10 MG tablet Take 1 tablet (10 mg total) by mouth every 6 (six) hours as needed for nausea or vomiting. 90 tablet 3   vitamin E 180 MG (400 UNITS)  capsule Take 1,200 Units by mouth 2 (two) times daily. Gel caps     No current facility-administered medications for this visit.    REVIEW OF SYSTEMS:   Constitutional: ( - ) fevers, ( - )  chills , ( - ) night sweats Eyes: ( - ) blurriness of vision, ( - ) double vision, ( - ) watery eyes Ears, nose, mouth, throat, and face: ( - ) mucositis, ( - ) sore throat Respiratory: ( - ) cough, ( - ) dyspnea, ( - ) wheezes Cardiovascular: ( - ) palpitation, ( - ) chest discomfort, ( - ) lower extremity swelling Gastrointestinal:  ( - ) nausea, ( - ) heartburn, ( - ) change in bowel habits Skin: ( - ) abnormal skin rashes Lymphatics: ( - ) new lymphadenopathy, ( - ) easy bruising Neurological: ( - ) numbness, ( - ) tingling, ( - ) new weaknesses Behavioral/Psych: ( - ) mood change, ( - ) new changes  All other systems were reviewed with the patient and are negative.  PHYSICAL EXAMINATION: ECOG PERFORMANCE STATUS: 1 - Symptomatic but completely ambulatory  Vitals:   01/30/22 0940  BP: 130/80  Pulse: (!) 101  Resp: 16  Temp: 97.8 F (36.6 C)  SpO2: 100%     Filed Weights   01/30/22 0940  Weight: (!) 340 lb 1.6 oz (154.3 kg)    GENERAL: Well-appearing elderly Caucasian male, alert, no distress and comfortable SKIN: skin color, texture, turgor are normal, no rashes or significant lesions EYES: conjunctiva are pink and non-injected, sclera clear LUNGS: clear to auscultation and percussion with normal breathing effort HEART: regular rate & rhythm and no murmurs and no lower extremity edema Musculoskeletal: no cyanosis of digits and no clubbing  PSYCH: alert & oriented x 3, fluent speech NEURO: no focal motor/sensory deficits  LABORATORY DATA:  I have reviewed the data as listed    Latest Ref Rng & Units 01/30/2022    9:11 AM 01/23/2022   11:45 AM 01/16/2022    9:43 AM  CBC  WBC 4.0 - 10.5 K/uL 0.9  2.2  2.5   Hemoglobin 13.0 - 17.0 g/dL 10.1  10.5  11.5   Hematocrit 39.0 - 52.0 %  29.0  30.4  33.3   Platelets 150 - 400 K/uL 107  73  129        Latest  Ref Rng & Units 01/30/2022    9:11 AM 01/23/2022   11:45 AM 01/16/2022    9:43 AM  CMP  Glucose 70 - 99 mg/dL 134  139  103   BUN 8 - 23 mg/dL _0 Creatinine 0.61 - 1.24 mg/dL 0.90  0.92  1.01   Sodium 135 - 145 mmol/L 135  134  136   Potassium 3.5 - 5.1 mmol/L 4.3  4.2  4.3   Chloride 98 - 111 mmol/L 105  104  104   CO2 22 - 32 mmol/L _1 Calcium 8.9 - 10.3 mg/dL 9.9  10.3  10.9   Total Protein 6.5 - 8.1 g/dL 5.8  6.1  6.5   Total Bilirubin 0.3 - 1.2 mg/dL 0.7  0.4  0.5   Alkaline Phos 38 - 126 U/L 46  45  46   AST 15 - 41 U/L _2 ALT 0 - 44 U/L _3 RADIOGRAPHIC STUDIES: No results found.  ASSESSMENT & PLAN Roi Jafari 61 y.o. male with medical history significant for localized esophageal adenocarcinoma who presents for a follow up visit.   After review the labs, review the records, discussion with the patient the findings are most consistent with a localized adenocarcinoma of the esophagus.  At this time our plan will consist of neoadjuvant chemoradiation followed by consideration of surgical resection.  The patient voices understanding of this plan moving forward.  We will plan to proceed with carbo paclitaxel weekly during his chemotherapy treatment.  PEG tube can be placed if the patient has marked difficulty swallowing.  Otherwise his labs are stable and he is a good candidate for treatment moving forward.  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 Stage IIB (cT2,cN0, cM0) --completed EUS and EBUS to finalize staging on 12/14/2021 --biopsy confirms moderate-poorly differentiated adenocarcinoma of the esophagus.  -- Port placed, chemotherapy education completed -- We will plan for surgical evaluation after the patient has completed neoadjuvant treatment. Plan:  --HOLD Carboplatin/Paclitaxel due to severe neutropenia today.  No  further chemotherapy will be scheduled as he completes radiation on 02/02/2022. --Labs today show white blood cell count 0.9, hemoglobin 10.1, MCV 87.6, and platelets of 107 --continue with weekly chemotherapy while receiving radiation. Final radiation on 02/02/2022.  -- RTC in 2 weeks for continued monitoring.   #Supportive Care -- chemotherapy education completed -- port placed -- zofran 85m q8H PRN and compazine 18mPO q6H for nausea -- EMLA cream for port -- no pain medication required at this time.   No orders of the defined types were placed in this encounter.   All questions were answered. The patient knows to call the clinic with any problems, questions or concerns.  A total of more than 30 minutes were spent on this encounter with face-to-face time and non-face-to-face time, including preparing to see the patient, ordering tests and/or medications, counseling the patient and coordination of care as outlined above.   JoLedell PeoplesMD Department of Hematology/Oncology CoNew [place]lint WeSan Carlos Apache Healthcare Corporationhone: [phone]: [phone]: [email or web address]  01/30/2022 10:33 AM

## 2022-01-30 ENCOUNTER — Other Ambulatory Visit: Payer: Self-pay

## 2022-01-30 ENCOUNTER — Inpatient Hospital Stay: Payer: No Typology Code available for payment source

## 2022-01-30 ENCOUNTER — Inpatient Hospital Stay: Payer: No Typology Code available for payment source | Admitting: Nutrition

## 2022-01-30 ENCOUNTER — Ambulatory Visit
Admission: RE | Admit: 2022-01-30 | Discharge: 2022-01-30 | Disposition: A | Discharge status: Home / Self Care | Payer: No Typology Code available for payment source | Source: Ambulatory Visit | Attending: Radiation Oncology | Admitting: Radiation Oncology

## 2022-01-30 ENCOUNTER — Telehealth: Payer: Self-pay

## 2022-01-30 ENCOUNTER — Inpatient Hospital Stay (HOSPITAL_BASED_OUTPATIENT_CLINIC_OR_DEPARTMENT_OTHER): Payer: No Typology Code available for payment source | Admitting: Hematology and Oncology

## 2022-01-30 VITALS — BP 130/80 | HR 101 | Temp 97.8°F | Resp 16 | Wt 340.1 lb

## 2022-01-30 DIAGNOSIS — Z510 Encounter for antineoplastic radiation therapy: Secondary | ICD-10-CM | POA: Diagnosis not present

## 2022-01-30 DIAGNOSIS — Z95828 Presence of other vascular implants and grafts: Secondary | ICD-10-CM

## 2022-01-30 DIAGNOSIS — C159 Malignant neoplasm of esophagus, unspecified: Secondary | ICD-10-CM | POA: Diagnosis not present

## 2022-01-30 DIAGNOSIS — Z5111 Encounter for antineoplastic chemotherapy: Secondary | ICD-10-CM | POA: Diagnosis not present

## 2022-01-30 LAB — CBC WITH DIFFERENTIAL (CANCER CENTER ONLY)
Abs Immature Granulocytes: 0 10*3/uL (ref 0.00–0.07)
Basophils Absolute: 0 10*3/uL (ref 0.0–0.1)
Basophils Relative: 1 %
Eosinophils Absolute: 0 10*3/uL (ref 0.0–0.5)
Eosinophils Relative: 1 %
HCT: 29 % — ABNORMAL LOW (ref 39.0–52.0)
Hemoglobin: 10.1 g/dL — ABNORMAL LOW (ref 13.0–17.0)
Immature Granulocytes: 0 %
Lymphocytes Relative: 2 %
Lymphs Abs: 0 10*3/uL — ABNORMAL LOW (ref 0.7–4.0)
MCH: 30.5 pg (ref 26.0–34.0)
MCHC: 34.8 g/dL (ref 30.0–36.0)
MCV: 87.6 fL (ref 80.0–100.0)
Monocytes Absolute: 0.1 10*3/uL (ref 0.1–1.0)
Monocytes Relative: 11 %
Neutro Abs: 0.7 10*3/uL — ABNORMAL LOW (ref 1.7–7.7)
Neutrophils Relative %: 85 %
Platelet Count: 107 10*3/uL — ABNORMAL LOW (ref 150–400)
RBC: 3.31 MIL/uL — ABNORMAL LOW (ref 4.22–5.81)
RDW: 14.8 % (ref 11.5–15.5)
WBC Count: 0.9 10*3/uL — CL (ref 4.0–10.5)
nRBC: 0 % (ref 0.0–0.2)

## 2022-01-30 LAB — CMP (CANCER CENTER ONLY)
ALT: 12 U/L (ref 0–44)
AST: 14 U/L — ABNORMAL LOW (ref 15–41)
Albumin: 3.5 g/dL (ref 3.5–5.0)
Alkaline Phosphatase: 46 U/L (ref 38–126)
Anion gap: 5 (ref 5–15)
BUN: 24 mg/dL — ABNORMAL HIGH (ref 8–23)
CO2: 25 mmol/L (ref 22–32)
Calcium: 9.9 mg/dL (ref 8.9–10.3)
Chloride: 105 mmol/L (ref 98–111)
Creatinine: 0.9 mg/dL (ref 0.61–1.24)
GFR, Estimated: >60 mL/min (ref >60)
Glucose, Bld: 134 mg/dL — ABNORMAL HIGH (ref 70–99)
Potassium: 4.3 mmol/L (ref 3.5–5.1)
Sodium: 135 mmol/L (ref 135–145)
Total Bilirubin: 0.7 mg/dL (ref 0.3–1.2)
Total Protein: 5.8 g/dL — ABNORMAL LOW (ref 6.5–8.1)

## 2022-01-30 LAB — RAD ONC ARIA SESSION SUMMARY
Course Elapsed Days: 35
Plan Fractions Treated to Date: 25
Plan Prescribed Dose Per Fraction: 1.8 Gy
Plan Total Fractions Prescribed: 25
Plan Total Prescribed Dose: 45 Gy
Reference Point Dosage Given to Date: 45 Gy
Reference Point Session Dosage Given: 1.8 Gy
Session Number: 25

## 2022-01-30 LAB — MAGNESIUM: Magnesium: 1.5 mg/dL — ABNORMAL LOW (ref 1.7–2.4)

## 2022-01-30 MED ORDER — SODIUM CHLORIDE 0.9% FLUSH
10.0000 mL | Freq: Once | INTRAVENOUS | Status: AC
  Administered 2022-01-30: 10 mL via INTRAVENOUS

## 2022-01-30 MED ORDER — SODIUM CHLORIDE 0.9% FLUSH
10.0000 mL | Freq: Once | INTRAVENOUS | Status: AC
  Administered 2022-01-30: 10 mL

## 2022-01-30 MED ORDER — HEPARIN SOD (PORK) LOCK FLUSH 100 UNIT/ML IV SOLN
500.0000 [IU] | Freq: Once | INTRAVENOUS | Status: AC
  Administered 2022-01-30: 500 [IU] via INTRAVENOUS

## 2022-01-30 NOTE — Telephone Encounter (Signed)
CRITICAL VALUE STICKER  CRITICAL VALUE:   WBC 0.9  RECEIVER (on-site recipient of call):   Bonnita Nasuti, [place] NOTIFIED: 09:42  MESSENGER (representative from lab):  Janett Billow  MD NOTIFIED:   Lorenso Courier  TIME OF NOTIFICATION:  09:47

## 2022-01-30 NOTE — Progress Notes (Signed)
Patient's infusion was canceled.  I met with patient and wife in my office.  His last radiation therapy is scheduled for Thursday, July 27.  He is not scheduled for additional chemotherapy.  Weight stable and was documented as 340 pounds on July 24.  Noted labs: Glucose 134, magnesium 1.5, white blood cell count 0.9.  Patient states he has met with his surgeon who is planning on doing an esophagectomy in the future.  Patient would like to know more about feeding tube.  Patient states surgeon told him his feeding tube would be placed in his stomach.  He has many questions about what formula to use and how it will be administered.  As always, he is very concerned with his blood sugars. Wife reports patient hesitant to eat very many carbohydrates.  He is currently using an oral nutrition supplement similar to Ensure max.    Reports he tolerates pured foods and textures.  His wife would like additional information/recipes.  Nutrition diagnosis: Unintentional weight loss stable.  Intervention: Patient educated on importance of continuing strategies for adequate protein and sufficient calories for minimal weight loss throughout remaining treatment. Continue strategies for consuming increased vegetables and lean proteins.  Blenderized foods as needed. Continue low carbohydrate nutritional supplements which provide approximately 150 cal and 30 g of protein.  Monitor blood sugars as recommended by MD. Brief education provided on basic tube feeding care and administration of formula.  Explained process of ordering tube feeding formulas and supplies.  I am unable to provide specific recommendations until after his surgery.  I have encouraged him to reach out to inpatient dietitian during his hospitalization. Encouraged him to contact me as needed throughout the process.  Monitoring, evaluation, goals: Patient will tolerate adequate calories and protein to minimize weight loss and promote  healing.  Next visit: To be scheduled.  **Disclaimer: This note was dictated with voice recognition software. Similar sounding words can inadvertently be transcribed and this note may contain transcription errors which may not have been corrected upon publication of note.**

## 2022-01-31 ENCOUNTER — Telehealth: Payer: Self-pay | Admitting: Hematology and Oncology

## 2022-01-31 ENCOUNTER — Ambulatory Visit
Admission: RE | Admit: 2022-01-31 | Discharge: 2022-01-31 | Disposition: A | Discharge status: Home / Self Care | Payer: No Typology Code available for payment source | Source: Ambulatory Visit | Attending: Radiation Oncology | Admitting: Radiation Oncology

## 2022-01-31 ENCOUNTER — Other Ambulatory Visit: Payer: Self-pay

## 2022-01-31 DIAGNOSIS — Z510 Encounter for antineoplastic radiation therapy: Secondary | ICD-10-CM | POA: Diagnosis not present

## 2022-01-31 LAB — RAD ONC ARIA SESSION SUMMARY
Course Elapsed Days: 36
Plan Fractions Treated to Date: 1
Plan Prescribed Dose Per Fraction: 1.8 Gy
Plan Total Fractions Prescribed: 3
Plan Total Prescribed Dose: 5.4 Gy
Reference Point Dosage Given to Date: 46.8 Gy
Reference Point Session Dosage Given: 1.8 Gy
Session Number: 26

## 2022-01-31 NOTE — Telephone Encounter (Signed)
Scheduled per 7/24 los, pt has been called and confirmed

## 2022-02-01 ENCOUNTER — Ambulatory Visit
Admission: RE | Admit: 2022-02-01 | Discharge: 2022-02-01 | Disposition: A | Discharge status: Home / Self Care | Payer: No Typology Code available for payment source | Source: Ambulatory Visit | Attending: Radiation Oncology | Admitting: Radiation Oncology

## 2022-02-01 ENCOUNTER — Other Ambulatory Visit: Payer: Self-pay

## 2022-02-01 DIAGNOSIS — Z510 Encounter for antineoplastic radiation therapy: Secondary | ICD-10-CM | POA: Diagnosis not present

## 2022-02-01 LAB — RAD ONC ARIA SESSION SUMMARY
Course Elapsed Days: 37
Plan Fractions Treated to Date: 2
Plan Prescribed Dose Per Fraction: 1.8 Gy
Plan Total Fractions Prescribed: 3
Plan Total Prescribed Dose: 5.4 Gy
Reference Point Dosage Given to Date: 48.6 Gy
Reference Point Session Dosage Given: 1.8 Gy
Session Number: 27

## 2022-02-02 ENCOUNTER — Other Ambulatory Visit: Payer: Self-pay

## 2022-02-02 ENCOUNTER — Ambulatory Visit: Payer: No Typology Code available for payment source

## 2022-02-02 ENCOUNTER — Ambulatory Visit
Admission: RE | Admit: 2022-02-02 | Discharge: 2022-02-02 | Disposition: A | Discharge status: Home / Self Care | Payer: No Typology Code available for payment source | Source: Ambulatory Visit | Attending: Radiation Oncology | Admitting: Radiation Oncology

## 2022-02-02 ENCOUNTER — Encounter: Payer: Self-pay | Admitting: Radiation Oncology

## 2022-02-02 DIAGNOSIS — Z510 Encounter for antineoplastic radiation therapy: Secondary | ICD-10-CM | POA: Diagnosis not present

## 2022-02-02 LAB — RAD ONC ARIA SESSION SUMMARY
Course Elapsed Days: 38
Plan Fractions Treated to Date: 3
Plan Prescribed Dose Per Fraction: 1.8 Gy
Plan Total Fractions Prescribed: 3
Plan Total Prescribed Dose: 5.4 Gy
Reference Point Dosage Given to Date: 50.4 Gy
Reference Point Session Dosage Given: 1.8 Gy
Session Number: 28

## 2022-02-03 ENCOUNTER — Encounter: Payer: No Typology Code available for payment source | Admitting: Thoracic Surgery (Cardiothoracic Vascular Surgery)

## 2022-02-14 ENCOUNTER — Inpatient Hospital Stay (HOSPITAL_BASED_OUTPATIENT_CLINIC_OR_DEPARTMENT_OTHER): Payer: No Typology Code available for payment source | Admitting: Physician Assistant

## 2022-02-14 ENCOUNTER — Telehealth: Payer: Self-pay

## 2022-02-14 ENCOUNTER — Other Ambulatory Visit: Payer: Self-pay

## 2022-02-14 ENCOUNTER — Telehealth: Payer: Self-pay | Admitting: Physician Assistant

## 2022-02-14 ENCOUNTER — Inpatient Hospital Stay: Payer: No Typology Code available for payment source | Attending: Physician Assistant

## 2022-02-14 VITALS — BP 102/75 | HR 92 | Temp 98.0°F | Resp 18

## 2022-02-14 DIAGNOSIS — Z79899 Other long term (current) drug therapy: Secondary | ICD-10-CM | POA: Diagnosis not present

## 2022-02-14 DIAGNOSIS — K2270 Barrett's esophagus without dysplasia: Secondary | ICD-10-CM | POA: Diagnosis present

## 2022-02-14 DIAGNOSIS — Z8616 Personal history of COVID-19: Secondary | ICD-10-CM | POA: Diagnosis not present

## 2022-02-14 DIAGNOSIS — R110 Nausea: Secondary | ICD-10-CM | POA: Diagnosis not present

## 2022-02-14 DIAGNOSIS — Z923 Personal history of irradiation: Secondary | ICD-10-CM | POA: Diagnosis not present

## 2022-02-14 DIAGNOSIS — Z833 Family history of diabetes mellitus: Secondary | ICD-10-CM | POA: Diagnosis not present

## 2022-02-14 DIAGNOSIS — Z8249 Family history of ischemic heart disease and other diseases of the circulatory system: Secondary | ICD-10-CM | POA: Insufficient documentation

## 2022-02-14 DIAGNOSIS — Z87442 Personal history of urinary calculi: Secondary | ICD-10-CM | POA: Diagnosis not present

## 2022-02-14 DIAGNOSIS — Z818 Family history of other mental and behavioral disorders: Secondary | ICD-10-CM | POA: Insufficient documentation

## 2022-02-14 DIAGNOSIS — Z8051 Family history of malignant neoplasm of kidney: Secondary | ICD-10-CM | POA: Diagnosis not present

## 2022-02-14 DIAGNOSIS — R590 Localized enlarged lymph nodes: Secondary | ICD-10-CM | POA: Diagnosis not present

## 2022-02-14 DIAGNOSIS — C159 Malignant neoplasm of esophagus, unspecified: Secondary | ICD-10-CM | POA: Diagnosis not present

## 2022-02-14 DIAGNOSIS — I9589 Other hypotension: Secondary | ICD-10-CM | POA: Diagnosis not present

## 2022-02-14 DIAGNOSIS — R42 Dizziness and giddiness: Secondary | ICD-10-CM | POA: Insufficient documentation

## 2022-02-14 DIAGNOSIS — R5383 Other fatigue: Secondary | ICD-10-CM | POA: Insufficient documentation

## 2022-02-14 DIAGNOSIS — Z800 Family history of malignant neoplasm of digestive organs: Secondary | ICD-10-CM | POA: Diagnosis not present

## 2022-02-14 DIAGNOSIS — Z8052 Family history of malignant neoplasm of bladder: Secondary | ICD-10-CM | POA: Insufficient documentation

## 2022-02-14 DIAGNOSIS — C155 Malignant neoplasm of lower third of esophagus: Secondary | ICD-10-CM | POA: Insufficient documentation

## 2022-02-14 DIAGNOSIS — Z87891 Personal history of nicotine dependence: Secondary | ICD-10-CM | POA: Diagnosis not present

## 2022-02-14 LAB — CBC WITH DIFFERENTIAL (CANCER CENTER ONLY)
Abs Immature Granulocytes: 0.03 10*3/uL (ref 0.00–0.07)
Basophils Absolute: 0 10*3/uL (ref 0.0–0.1)
Basophils Relative: 0 %
Eosinophils Absolute: 0 10*3/uL (ref 0.0–0.5)
Eosinophils Relative: 0 %
HCT: 33.3 % — ABNORMAL LOW (ref 39.0–52.0)
Hemoglobin: 11.3 g/dL — ABNORMAL LOW (ref 13.0–17.0)
Immature Granulocytes: 1 %
Lymphocytes Relative: 7 %
Lymphs Abs: 0.2 10*3/uL — ABNORMAL LOW (ref 0.7–4.0)
MCH: 30.5 pg (ref 26.0–34.0)
MCHC: 33.9 g/dL (ref 30.0–36.0)
MCV: 90 fL (ref 80.0–100.0)
Monocytes Absolute: 0.3 10*3/uL (ref 0.1–1.0)
Monocytes Relative: 12 %
Neutro Abs: 1.9 10*3/uL (ref 1.7–7.7)
Neutrophils Relative %: 80 %
Platelet Count: 157 10*3/uL (ref 150–400)
RBC: 3.7 MIL/uL — ABNORMAL LOW (ref 4.22–5.81)
RDW: 16.7 % — ABNORMAL HIGH (ref 11.5–15.5)
WBC Count: 2.4 10*3/uL — ABNORMAL LOW (ref 4.0–10.5)
nRBC: 0 % (ref 0.0–0.2)

## 2022-02-14 LAB — CMP (CANCER CENTER ONLY)
ALT: 12 U/L (ref 0–44)
AST: 16 U/L (ref 15–41)
Albumin: 3.6 g/dL (ref 3.5–5.0)
Alkaline Phosphatase: 55 U/L (ref 38–126)
Anion gap: 3 — ABNORMAL LOW (ref 5–15)
BUN: 20 mg/dL (ref 8–23)
CO2: 30 mmol/L (ref 22–32)
Calcium: 10.9 mg/dL — ABNORMAL HIGH (ref 8.9–10.3)
Chloride: 105 mmol/L (ref 98–111)
Creatinine: 1 mg/dL (ref 0.61–1.24)
GFR, Estimated: >60 mL/min (ref >60)
Glucose, Bld: 137 mg/dL — ABNORMAL HIGH (ref 70–99)
Potassium: 4.2 mmol/L (ref 3.5–5.1)
Sodium: 138 mmol/L (ref 135–145)
Total Bilirubin: 0.4 mg/dL (ref 0.3–1.2)
Total Protein: 6.4 g/dL — ABNORMAL LOW (ref 6.5–8.1)

## 2022-02-14 LAB — MAGNESIUM: Magnesium: 1.7 mg/dL (ref 1.7–2.4)

## 2022-02-14 NOTE — Telephone Encounter (Signed)
T/C To [facility] to speak with the nurse for Dr Rondell Reams.    Spoke with Leroy Sea, RN for for Dr Rondell Reams and advised pt's BP today was 102/75 and Murray Hodgkins has advised him to hold his BP medication (norvasc)  He will relay message to the Dr.

## 2022-02-14 NOTE — Progress Notes (Signed)
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 Stage IIB (cT2,cN0, cM0) 11/13/2021: Presented to the emergency department complaining of dysphagia and food impaction. Underwent EGD with Dr. Hilarie Fredrickson. Findings revealed medium sized fungating mass in the lower third of th esophagus, 35 cm from the incisors and extending into the GE junction.   It appears to have arisen in the background of Barrett's esophagus, the mass was nonobstructing and partially circumferential. Pathology confirmed moderately to poorly differentiated adenocarcinoma.  11/30/2021: PET scan showed  hypermetabolic activity in the distal esophagus with an SUV of 7.7 with thickening measuring up to 15 mm and spanning just to above the GE junction.  Diffuse mediastinal adenopathy showed dense calcification in all nearly symmetric findings.  12/06/2021: Established care with Radiation Oncology with Dr. Kyung Rudd and Shona Simpson PA-C 12/12/2021: Establish care with Medication Oncology with Dr. Narda Rutherford and Dede Query PA-C 12/26/2021: Started Carbo/Paclitaxel with radiation. End chemotherapy on 01/23/2022 (last dose on 01/30/2022 held due to neutropenia). Last radiation 02/02/2022.   Interval History:  Keith Moreno 61 y.o. male with medical history significant for localized esophageal adenocarcinoma who presents for a follow up visit. The patient's last visit was on 01/30/2022. In the interim since the last visit he has completed chemoradiation therapy.  On exam today Mr. Vert is accompanied by his wife. He reports having persistent  fatigue and admits that he is more sedentary. He reports feeling dizzy but denies any syncopal episodes. He reports nausea which has improved with zofran ODT. He is taking stool softeners in order to have daily bowel movements. He is currently on a puree/liquid diet and reports occasional episodes of dysphagia. He denies odynophagia or regurgitation. He denies fevers, chills, shortness of breath, chest pain or cough. He has no other complaints.  A full 10 point ROS was otherwise negative.  MEDICAL HISTORY:  Past Medical History:  Diagnosis Date   Anxiety    COVID 2022   mild case   History of kidney stones    Hypertension    Kidney infection    at age 65   Pneumonia    9 years ago   Pre-diabetes    PTSD (post-traumatic stress disorder)     SURGICAL HISTORY: Past Surgical History:  Procedure Laterality Date   BIOPSY  11/13/2021   Procedure: BIOPSY;  Surgeon: Jerene Bears, MD;  Location: [place];  Service: Gastroenterology;;   BIOPSY  12/14/2021   Procedure: BIOPSY;  Surgeon: Irving Copas., MD;  Location: [facility] ENDOSCOPY;  Service: Gastroenterology;;   BRONCHIAL BRUSHINGS  12/14/2021   Procedure: BRONCHIAL BRUSHINGS;  Surgeon: Collene Gobble, MD;  Location: [place];  Service: Cardiopulmonary;;   BRONCHIAL NEEDLE ASPIRATION BIOPSY  12/14/2021   Procedure: BRONCHIAL NEEDLE ASPIRATION BIOPSIES;  Surgeon: Collene Gobble, MD;  Location: [facility] ENDOSCOPY;  Service: Cardiopulmonary;;   ESOPHAGOGASTRODUODENOSCOPY N/A 11/13/2021   Procedure: ESOPHAGOGASTRODUODENOSCOPY (EGD);  Surgeon: Jerene Bears, MD;  Location: [facility] ENDOSCOPY;  Service: Gastroenterology;  Laterality: N/A;   ESOPHAGOGASTRODUODENOSCOPY (EGD) WITH PROPOFOL N/A 12/14/2021   Procedure: ESOPHAGOGASTRODUODENOSCOPY (EGD) WITH PROPOFOL;  Surgeon: Rush Landmark Telford Nab., MD;  Location: [place];  Service: Gastroenterology;  Laterality: N/A;  EUS N/A 12/14/2021   Procedure: UPPER ENDOSCOPIC ULTRASOUND (EUS) LINEAR;  Surgeon:  Irving Copas., MD;  Location: [place];  Service: Gastroenterology;  Laterality: N/A;   FINE NEEDLE ASPIRATION  12/14/2021   Procedure: FINE NEEDLE ASPIRATION (FNA) LINEAR;  Surgeon: Irving Copas., MD;  Location: [place];  Service: Gastroenterology;;   FOREIGN BODY REMOVAL  11/13/2021   Procedure: FOREIGN BODY REMOVAL;  Surgeon: Jerene Bears, MD;  Location: [place];  Service: Gastroenterology;;   HUMERUS FRACTURE SURGERY Right    IR IMAGING GUIDED PORT INSERTION  12/23/2021   TONSILLECTOMY AND ADENOIDECTOMY     VIDEO BRONCHOSCOPY WITH ENDOBRONCHIAL ULTRASOUND Bilateral 12/14/2021   Procedure: VIDEO BRONCHOSCOPY WITH ENDOBRONCHIAL ULTRASOUND;  Surgeon: Collene Gobble, MD;  Location: [place];  Service: Cardiopulmonary;  Laterality: Bilateral;    SOCIAL HISTORY: Social History   Socioeconomic History   Marital status: Single    Spouse name: Not on file   Number of children: Not on file   Years of education: Not on file   Highest education level: Not on file  Occupational History   Not on file  Tobacco Use   Smoking status: Former    Types: Cigars, Cigarettes, Pipe    Quit date: 82    Years since quitting: 40.6   Smokeless tobacco: Former    Types: Chew    Quit date: 1983  Vaping Use   Vaping Use: Never used  Substance and Sexual Activity   Alcohol use: Yes    Comment: Occasional   Drug use: No   Sexual activity: Not on file  Other Topics Concern   Not on file  Social History Narrative   Not on file   Social Determinants of Health   Financial Resource Strain: Not on file  Food Insecurity: Not on file  Transportation Needs: Not on file  Physical Activity: Not on file  Stress: Not on file  Social Connections: Not on file  Intimate Partner Violence: Not on file    FAMILY HISTORY: Family History  Problem Relation Age of Onset   Hypertension Mother    Diabetes Mother    Bipolar disorder Mother    Hypertension Father    Kidney  cancer Sister    Colon cancer Paternal Uncle    Cancer Cousin    Bladder Cancer Cousin    Cancer Cousin     ALLERGIES:  is allergic to procaine, chocolate, and other.  MEDICATIONS:  Current Outpatient Medications  Medication Sig Dispense Refill   Alogliptin Benzoate 25 MG TABS Take 12.5 mg by mouth at bedtime.     amLODipine (NORVASC) 10 MG tablet Take 1 tablet by mouth at bedtime.     Ascorbic Acid (VITAMIN C) 1000 MG tablet Take 2,000 mg by mouth every morning.     Cholecalciferol (VITAMIN D3) 50 MCG (2000 UT) capsule Take 20,000 Units by mouth at bedtime. 10 gel caps     CINNAMON PO Take 4,000 mg by mouth 2 (two) times daily. 1000 mg each     diphenhydramine-acetaminophen (TYLENOL PM) 25-500 MG TABS tablet Take 2 tablets by mouth at bedtime. 14 tablet    Krill Oil 350 MG CAPS Take 1,050 mg by mouth at bedtime.     lidocaine (LIDODERM) 5 % Place 1 patch onto the skin as needed.     lidocaine-prilocaine (EMLA) cream Apply 1 application. topically as needed. 30 g 0   lisinopril (PRINIVIL,ZESTRIL) 40 MG tablet Take 40 mg by mouth at bedtime.  MAGNESIUM CITRATE PO Take 100 mg by mouth at bedtime.     Melatonin 10 MG TABS Take 40 mg by mouth at bedtime as needed (Sleep).     olopatadine (PATANOL) 0.1 % ophthalmic solution Place 1 drop into both eyes 2 (two) times daily.     ondansetron (ZOFRAN-ODT) 8 MG disintegrating tablet Take 1 tablet (8 mg total) by mouth every 8 (eight) hours as needed for nausea or vomiting. 90 tablet 3   pioglitazone (ACTOS) 30 MG tablet Take 30 mg by mouth every morning.     Potassium 99 MG TABS Take 198 mg by mouth daily.     prochlorperazine (COMPAZINE) 10 MG tablet Take 1 tablet (10 mg total) by mouth every 6 (six) hours as needed for nausea or vomiting. 90 tablet 3   vitamin E 180 MG (400 UNITS) capsule Take 1,200 Units by mouth 2 (two) times daily. Gel caps     No current facility-administered medications for this visit.    REVIEW OF SYSTEMS:    Constitutional: ( - ) fevers, ( - )  chills , ( - ) night sweats Eyes: ( - ) blurriness of vision, ( - ) double vision, ( - ) watery eyes Ears, nose, mouth, throat, and face: ( - ) mucositis, ( - ) sore throat Respiratory: ( - ) cough, ( - ) dyspnea, ( - ) wheezes Cardiovascular: ( - ) palpitation, ( - ) chest discomfort, ( - ) lower extremity swelling Gastrointestinal:  ( + ) nausea, ( - ) heartburn, ( - ) change in bowel habits Skin: ( - ) abnormal skin rashes Lymphatics: ( - ) new lymphadenopathy, ( - ) easy bruising Neurological: ( - ) numbness, ( - ) tingling, ( - ) new weaknesses Behavioral/Psych: ( - ) mood change, ( - ) new changes  All other systems were reviewed with the patient and are negative.  PHYSICAL EXAMINATION: ECOG PERFORMANCE STATUS: 1 - Symptomatic but completely ambulatory  Vitals:   02/14/22 1257  BP: 102/75  Pulse: 92  Resp: 18  Temp: 98 F (36.7 C)  SpO2: 98%     Filed Weights    GENERAL: Well-appearing elderly Caucasian male, alert, no distress and comfortable SKIN: skin color, texture, turgor are normal, no rashes or significant lesions EYES: conjunctiva are pink and non-injected, sclera clear LUNGS: clear to auscultation and percussion with normal breathing effort HEART: regular rate & rhythm and no murmurs and no lower extremity edema Musculoskeletal: no cyanosis of digits and no clubbing  PSYCH: alert & oriented x 3, fluent speech NEURO: no focal motor/sensory deficits  LABORATORY DATA:  I have reviewed the data as listed    Latest Ref Rng & Units 02/14/2022   12:27 PM 01/30/2022    9:11 AM 01/23/2022   11:45 AM  CBC  WBC 4.0 - 10.5 K/uL 2.4  0.9  2.2   Hemoglobin 13.0 - 17.0 g/dL 11.3  10.1  10.5   Hematocrit 39.0 - 52.0 % 33.3  29.0  30.4   Platelets 150 - 400 K/uL 157  107  73        Latest Ref Rng & Units 02/14/2022   12:27 PM 01/30/2022    9:11 AM 01/23/2022   11:45 AM  CMP  Glucose 70 - 99 mg/dL 137  134  139   BUN 8 - 23 mg/dL  '20  24  24   ' Creatinine 0.61 - 1.24 mg/dL 1.00  0.90  0.92   Sodium 135 -  145 mmol/L 138  135  134   Potassium 3.5 - 5.1 mmol/L 4.2  4.3  4.2   Chloride 98 - 111 mmol/L 105  105  104   CO2 22 - 32 mmol/L '30  25  27   ' Calcium 8.9 - 10.3 mg/dL 10.9  9.9  10.3   Total Protein 6.5 - 8.1 g/dL 6.4  5.8  6.1   Total Bilirubin 0.3 - 1.2 mg/dL 0.4  0.7  0.4   Alkaline Phos 38 - 126 U/L 55  46  45   AST 15 - 41 U/L '16  14  14   ' ALT 0 - 44 U/L '12  12  13     ' RADIOGRAPHIC STUDIES: No results found.  ASSESSMENT & PLAN Irl Bodie 61 y.o. male with medical history significant for localized esophageal adenocarcinoma who presents for a follow up visit.   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 Stage IIB (cT2,cN0, cM0) --completed EUS and EBUS to finalize staging on 12/14/2021 --biopsy confirms moderate-poorly differentiated adenocarcinoma of the esophagus.  -- Received concurrent chemoradiation, started on 12/26/2021. End chemotherapy on 01/23/2022 (last dose on 01/30/2022 held due to neutropenia). Last radiation 02/02/2022. Plan:  --Labs today were reviewed and shows improvement. WBC 2.4, Hgb 11.3, Plt 157K.  --Patient is scheduled for PET scan on 03/01/2022 and follow up with [name] on 03/03/2022.  --RTC in 3 months for a follow up with Dr. Lorenso Courier  #Hypotension: --Today's BP is 102/75 and patient is reporting dizziness and presyncope.  --Current BP medications include amlodipine 10 mg PO daily and Lisinopril 40 mg PO bedtime.  --Recommend to hold amlodipine and follow up with PCP, Dr. Herold Harms. We have requested a follow up with Dr. Rondell Reams.  --Advised to stay hydrated.   #Supportive Care -- chemotherapy education completed -- port placed -- zofran 1m q8H PRN and compazine 185mPO q6H for nausea -- EMLA cream for port -- no pain medication required at this time.   No orders of the defined types were placed in this encounter.   All questions  were answered. The patient knows to call the clinic with any problems, questions or concerns.  I have spent a total of 30 minutes minutes of face-to-face and non-face-to-face time, preparing to see the patient, performing a medically appropriate examination, counseling and educating the patient, , communicating with other health care professionals, documenting clinical information in the electronic health record, and care coordination.   IrDede QueryA-C Dept of Hematology and OnPenfieldt [facility] Centerhone: [phone]  02/14/2022 4:07 PM

## 2022-02-14 NOTE — Telephone Encounter (Signed)
Per 8/8 secure chat called to sch pt a lab appointment before pet scan

## 2022-02-15 ENCOUNTER — Telehealth: Payer: Self-pay | Admitting: Hematology and Oncology

## 2022-02-15 ENCOUNTER — Encounter: Payer: Self-pay | Admitting: Hematology and Oncology

## 2022-02-15 NOTE — Telephone Encounter (Signed)
Per 8/8 los called and spoke to pt about appointment pt confirmed appointment

## 2022-02-15 NOTE — Progress Notes (Signed)
                                                                                                                                                             Patient Name: Keith Moreno MRN: [id number] DOB: 13-Mar-1961 Referring Physician: ADDO Sharlyne Cai A Date of Service: 02/02/2022 [facility] [place], [place]                                                        End Of Treatment Note  Diagnoses: C15.5-Malignant neoplasm of lower third of esophagus  Cancer Staging: cT3NxM0 adenocarcinoma of the distal esophagus  Intent: Curative  Radiation Treatment Dates: 12/26/2021 through 02/02/2022 Site Technique Total Dose (Gy) Dose per Fx (Gy) Completed Fx Beam Energies  Esophagus: Esoph IMRT 45/45 1.8 25/25 6X  Esophagus: Esoph_Bst IMRT 5.4/5.4 1.8 3/3 6X   Narrative: The patient tolerated radiation therapy relatively well. The patient developed fatigue, esophagitis, and slight erythema in the skin along the treatment field.  Plan: The patient will receive a call in about one month from the radiation oncology department. He will continue follow up with Dr. Lorenso Courier as well.   ________________________________________________    Carola Rhine, [facility]

## 2022-02-16 ENCOUNTER — Other Ambulatory Visit: Payer: Self-pay

## 2022-02-17 ENCOUNTER — Encounter: Payer: No Typology Code available for payment source | Admitting: Thoracic Surgery (Cardiothoracic Vascular Surgery)

## 2022-02-22 ENCOUNTER — Other Ambulatory Visit: Payer: Self-pay

## 2022-03-01 ENCOUNTER — Inpatient Hospital Stay: Payer: No Typology Code available for payment source

## 2022-03-01 ENCOUNTER — Other Ambulatory Visit: Payer: No Typology Code available for payment source

## 2022-03-01 ENCOUNTER — Other Ambulatory Visit: Payer: Self-pay

## 2022-03-01 ENCOUNTER — Ambulatory Visit (HOSPITAL_COMMUNITY)
Admission: RE | Admit: 2022-03-01 | Discharge: 2022-03-01 | Disposition: A | Discharge status: Home / Self Care | Payer: No Typology Code available for payment source | Source: Ambulatory Visit | Attending: Thoracic Surgery (Cardiothoracic Vascular Surgery) | Admitting: Thoracic Surgery (Cardiothoracic Vascular Surgery)

## 2022-03-01 DIAGNOSIS — Z95828 Presence of other vascular implants and grafts: Secondary | ICD-10-CM

## 2022-03-01 DIAGNOSIS — C155 Malignant neoplasm of lower third of esophagus: Secondary | ICD-10-CM | POA: Diagnosis not present

## 2022-03-01 DIAGNOSIS — C159 Malignant neoplasm of esophagus, unspecified: Secondary | ICD-10-CM | POA: Diagnosis present

## 2022-03-01 LAB — CBC WITH DIFFERENTIAL (CANCER CENTER ONLY)
Abs Immature Granulocytes: 0.03 10*3/uL (ref 0.00–0.07)
Basophils Absolute: 0 10*3/uL (ref 0.0–0.1)
Basophils Relative: 1 %
Eosinophils Absolute: 0.1 10*3/uL (ref 0.0–0.5)
Eosinophils Relative: 2 %
HCT: 32.7 % — ABNORMAL LOW (ref 39.0–52.0)
Hemoglobin: 11.2 g/dL — ABNORMAL LOW (ref 13.0–17.0)
Immature Granulocytes: 1 %
Lymphocytes Relative: 7 %
Lymphs Abs: 0.2 10*3/uL — ABNORMAL LOW (ref 0.7–4.0)
MCH: 30.5 pg (ref 26.0–34.0)
MCHC: 34.3 g/dL (ref 30.0–36.0)
MCV: 89.1 fL (ref 80.0–100.0)
Monocytes Absolute: 0.4 10*3/uL (ref 0.1–1.0)
Monocytes Relative: 13 %
Neutro Abs: 2.7 10*3/uL (ref 1.7–7.7)
Neutrophils Relative %: 76 %
Platelet Count: 192 10*3/uL (ref 150–400)
RBC: 3.67 MIL/uL — ABNORMAL LOW (ref 4.22–5.81)
RDW: 16.1 % — ABNORMAL HIGH (ref 11.5–15.5)
WBC Count: 3.5 10*3/uL — ABNORMAL LOW (ref 4.0–10.5)
nRBC: 0 % (ref 0.0–0.2)

## 2022-03-01 LAB — MAGNESIUM: Magnesium: 1.7 mg/dL (ref 1.7–2.4)

## 2022-03-01 LAB — CMP (CANCER CENTER ONLY)
ALT: 13 U/L (ref 0–44)
AST: 16 U/L (ref 15–41)
Albumin: 3.7 g/dL (ref 3.5–5.0)
Alkaline Phosphatase: 70 U/L (ref 38–126)
Anion gap: 4 — ABNORMAL LOW (ref 5–15)
BUN: 17 mg/dL (ref 8–23)
CO2: 30 mmol/L (ref 22–32)
Calcium: 12.8 mg/dL — ABNORMAL HIGH (ref 8.9–10.3)
Chloride: 105 mmol/L (ref 98–111)
Creatinine: 0.87 mg/dL (ref 0.61–1.24)
GFR, Estimated: >60 mL/min (ref >60)
Glucose, Bld: 96 mg/dL (ref 70–99)
Potassium: 3.8 mmol/L (ref 3.5–5.1)
Sodium: 139 mmol/L (ref 135–145)
Total Bilirubin: 0.5 mg/dL (ref 0.3–1.2)
Total Protein: 6.3 g/dL — ABNORMAL LOW (ref 6.5–8.1)

## 2022-03-01 LAB — GLUCOSE, CAPILLARY: Glucose-Capillary: 96 mg/dL (ref 70–99)

## 2022-03-01 MED ORDER — FLUDEOXYGLUCOSE F - 18 (FDG) INJECTION
16.0000 | Freq: Once | INTRAVENOUS | Status: AC
  Administered 2022-03-01: 15.9 via INTRAVENOUS

## 2022-03-01 MED ORDER — SODIUM CHLORIDE 0.9% FLUSH
10.0000 mL | Freq: Once | INTRAVENOUS | Status: AC
  Administered 2022-03-01: 10 mL

## 2022-03-02 NOTE — Progress Notes (Signed)
[address]       [place] [address]             [phone]                    Ezel Pinera Bearden Medical Record #[id number] Date of Birth: 05-21-1961  Referring: [facility] Primary Care: [facility] Primary Cardiologist: None  Chief Complaint:    Chief Complaint  Patient presents with   Esophageal Cancer    F/u after PET Scan 03/01/22     History of Present Illness:    Keith Moreno 61 y.o. male presents today for his follow-up appointment.  He completed his radiation therapy February 02, 2022.  He had to stop his chemotherapy early due to neutropenia.  His diet has fluctuated about 10 pounds throughout the course of his therapy.  His biggest issue has been labile blood pressures.      Zubrod Score: At the time of surgery this patient's most appropriate activity status/level should be described as: '[x]'$     0    Normal activity, no symptoms '[]'$     1    Restricted in physical strenuous activity but ambulatory, able to do out light work '[]'$     2    Ambulatory and capable of self care, unable to do work activities, up and about               >50 % of waking hours                              '[]'$     3    Only limited self care, in bed greater than 50% of waking hours '[]'$     4    Completely disabled, no self care, confined to bed or chair '[]'$     5    Moribund   Past Medical History:  Diagnosis Date   Anxiety    COVID 2022   mild case   History of kidney stones    Hypertension    Kidney infection    at age 75   Pneumonia    9 years ago   Pre-diabetes    PTSD (post-traumatic stress disorder)     Past Surgical History:  Procedure Laterality Date   BIOPSY  11/13/2021   Procedure: BIOPSY;  Surgeon: Jerene Bears, MD;  Location: [place];  Service: Gastroenterology;;   BIOPSY  12/14/2021   Procedure: BIOPSY;  Surgeon: Irving Copas., MD;  Location: [facility] ENDOSCOPY;  Service: Gastroenterology;;   BRONCHIAL  BRUSHINGS  12/14/2021   Procedure: BRONCHIAL BRUSHINGS;  Surgeon: Collene Gobble, MD;  Location: [place];  Service: Cardiopulmonary;;   BRONCHIAL NEEDLE ASPIRATION BIOPSY  12/14/2021   Procedure: BRONCHIAL NEEDLE ASPIRATION BIOPSIES;  Surgeon: Collene Gobble, MD;  Location: [facility] ENDOSCOPY;  Service: Cardiopulmonary;;   ESOPHAGOGASTRODUODENOSCOPY N/A 11/13/2021   Procedure: ESOPHAGOGASTRODUODENOSCOPY (EGD);  Surgeon: Jerene Bears, MD;  Location: [facility] ENDOSCOPY;  Service: Gastroenterology;  Laterality: N/A;   ESOPHAGOGASTRODUODENOSCOPY (EGD) WITH PROPOFOL N/A 12/14/2021   Procedure: ESOPHAGOGASTRODUODENOSCOPY (EGD) WITH PROPOFOL;  Surgeon: Rush Landmark Telford Nab., MD;  Location: [place];  Service: Gastroenterology;  Laterality: N/A;   EUS N/A 12/14/2021   Procedure: UPPER ENDOSCOPIC ULTRASOUND (EUS) LINEAR;  Surgeon: Irving Copas., MD;  Location: [place];  Service: Gastroenterology;  Laterality: N/A;   FINE NEEDLE ASPIRATION  12/14/2021   Procedure: FINE NEEDLE  ASPIRATION (FNA) LINEAR;  Surgeon: Mansouraty, Telford Nab., MD;  Location: [place];  Service: Gastroenterology;;   FOREIGN BODY REMOVAL  11/13/2021   Procedure: FOREIGN BODY REMOVAL;  Surgeon: Jerene Bears, MD;  Location: [place];  Service: Gastroenterology;;   HUMERUS FRACTURE SURGERY Right    IR IMAGING GUIDED PORT INSERTION  12/23/2021   TONSILLECTOMY AND ADENOIDECTOMY     VIDEO BRONCHOSCOPY WITH ENDOBRONCHIAL ULTRASOUND Bilateral 12/14/2021   Procedure: VIDEO BRONCHOSCOPY WITH ENDOBRONCHIAL ULTRASOUND;  Surgeon: Collene Gobble, MD;  Location: [place];  Service: Cardiopulmonary;  Laterality: Bilateral;    Family History  Problem Relation Age of Onset   Hypertension Mother    Diabetes Mother    Bipolar disorder Mother    Hypertension Father    Kidney cancer Sister    Colon cancer Paternal Uncle    Cancer Cousin    Bladder Cancer Cousin    Cancer Cousin      Social History   Tobacco Use  Smoking  Status Former   Types: Cigars, Cigarettes, Pipe   Quit date: 1983   Years since quitting: 40.6  Smokeless Tobacco Former   Types: Chew   Quit date: 1983    Social History   Substance and Sexual Activity  Alcohol Use Yes   Comment: Occasional     Allergies  Allergen Reactions   Procaine     Other reaction(s): Other, Respiratory Distress   Chocolate     Hyperactivity, respiratory distress   Other     Novocaine- respiratory distress    Current Outpatient Medications  Medication Sig Dispense Refill   Alogliptin Benzoate 25 MG TABS Take 12.5 mg by mouth at bedtime.     amLODipine (NORVASC) 10 MG tablet Take 1 tablet by mouth at bedtime.     Ascorbic Acid (VITAMIN C) 1000 MG tablet Take 2,000 mg by mouth every morning.     Cholecalciferol (VITAMIN D3) 50 MCG (2000 UT) capsule Take 20,000 Units by mouth at bedtime. 10 gel caps     CINNAMON PO Take 4,000 mg by mouth 2 (two) times daily. 1000 mg each     diphenhydramine-acetaminophen (TYLENOL PM) 25-500 MG TABS tablet Take 2 tablets by mouth at bedtime. 14 tablet    Krill Oil 350 MG CAPS Take 1,050 mg by mouth at bedtime.     lidocaine (LIDODERM) 5 % Place 1 patch onto the skin as needed.     lidocaine-prilocaine (EMLA) cream Apply 1 application. topically as needed. 30 g 0   lisinopril (PRINIVIL,ZESTRIL) 40 MG tablet Take 40 mg by mouth at bedtime.     MAGNESIUM CITRATE PO Take 100 mg by mouth at bedtime.     Melatonin 10 MG TABS Take 40 mg by mouth at bedtime as needed (Sleep).     olopatadine (PATANOL) 0.1 % ophthalmic solution Place 1 drop into both eyes 2 (two) times daily.     ondansetron (ZOFRAN-ODT) 8 MG disintegrating tablet Take 1 tablet (8 mg total) by mouth every 8 (eight) hours as needed for nausea or vomiting. 90 tablet 3   pioglitazone (ACTOS) 30 MG tablet Take 30 mg by mouth every morning.     Potassium 99 MG TABS Take 198 mg by mouth daily.     prochlorperazine (COMPAZINE) 10 MG tablet Take 1 tablet (10 mg total)  by mouth every 6 (six) hours as needed for nausea or vomiting. 90 tablet 3   vitamin E 180 MG (400 UNITS) capsule Take 1,200 Units by mouth 2 (  two) times daily. Gel caps     polyethylene glycol (GOLYTELY) 236 g solution Take 4,000 mLs by mouth once for 1 dose. Drink 1/2 bottle, beginning at 10 am, the day before your surgery. 4000 mL 0   No current facility-administered medications for this visit.    Review of Systems  Constitutional:  Positive for malaise/fatigue and weight loss.  Respiratory:  Positive for shortness of breath.   Cardiovascular:  Positive for chest pain.  Gastrointestinal:  Negative for heartburn, nausea and vomiting.     PHYSICAL EXAMINATION: BP 107/70   Pulse 92   Resp 20   Ht 6' (1.829 m)   Wt (!) 330 lb (149.7 kg)   SpO2 97% Comment: RA  BMI 44.76 kg/m  Physical Exam Constitutional:      Appearance: He is obese. He is not ill-appearing.  Cardiovascular:     Rate and Rhythm: Normal rate and regular rhythm.  Pulmonary:     Effort: Pulmonary effort is normal. No respiratory distress.  Musculoskeletal:     Cervical back: Normal range of motion.  Neurological:     Mental Status: He is alert.     Diagnostic Studies & Laboratory data:     PET/CT: 03/01/2022  IMPRESSION: 1. Interval decreased hypermetabolic activity in the distal esophagus consistent with response to therapy. 2. Previously demonstrated hypermetabolic noncalcified lymph node in the gastrohepatic ligament show slightly decreased metabolic activity. Although not entirely specific given the thoracic nodal findings, this could reflect a small nodal metastasis based on location. 3. No other findings suspicious for metastatic disease. No evidence of disease progression. 4. Numerous hypermetabolic and partially calcified mediastinal and hilar lymph nodes are again noted, similar to previous study and partially imaged on remote abdominal CT. These are likely related to chronic granulomatous  disease (sarcoidosis or infectious).      I have independently reviewed the above radiology studies  and reviewed the findings with the patient.   Recent Lab Findings: Lab Results  Component Value Date   WBC 3.5 (L) 03/01/2022   HGB 11.2 (L) 03/01/2022   HCT 32.7 (L) 03/01/2022   PLT 192 03/01/2022   GLUCOSE 96 03/01/2022   ALT 13 03/01/2022   AST 16 03/01/2022   NA 139 03/01/2022   K 3.8 03/01/2022   CL 105 03/01/2022   CREATININE 0.87 03/01/2022   BUN 17 03/01/2022   CO2 30 03/01/2022     Problem List: Stage IIb distal esophageal adenocarcinoma Mediastinal adenopathy consistent with chronic granulomatous disease. History of neutropenia during neoadjuvant therapy Morbid obesity  Assessment / Plan:   61 year old male with esophageal adenocarcinoma stage IIb status post neoadjuvant chemoradiation.  He did not complete all of his chemotherapy due to neutropenia however he did complete his radiation on 02/02/2022.  Post radiation PET/CT shows reduction in the avidity and the distal esophageal cancer indicating a good response.  He does still have some lymphadenopathy and his gastrohepatic ligament.  We discussed the risks of robotic assisted Ivor Lewis esophagectomy.  Of concern is his neutropenia and incomplete neoadjuvant chemotherapy course, his morbid obesity, and the calcified lymph nodes in his hilum.  He and his wife remain resolute in their decision to proceed with surgery.  He is tentatively scheduled for the middle of September.  He will require a stress test prior to surgery.       I  spent 30 minutes with the patient face to face counseling and coordination of care.    Lajuana Matte 03/03/2022  3:15 PM      \

## 2022-03-03 ENCOUNTER — Other Ambulatory Visit: Payer: Self-pay | Admitting: *Deleted

## 2022-03-03 ENCOUNTER — Ambulatory Visit (INDEPENDENT_AMBULATORY_CARE_PROVIDER_SITE_OTHER): Payer: No Typology Code available for payment source | Admitting: Thoracic Surgery (Cardiothoracic Vascular Surgery)

## 2022-03-03 ENCOUNTER — Encounter (HOSPITAL_COMMUNITY): Payer: Self-pay | Admitting: Thoracic Surgery (Cardiothoracic Vascular Surgery)

## 2022-03-03 ENCOUNTER — Other Ambulatory Visit: Payer: Self-pay | Admitting: Thoracic Surgery (Cardiothoracic Vascular Surgery)

## 2022-03-03 ENCOUNTER — Encounter: Payer: Self-pay | Admitting: *Deleted

## 2022-03-03 VITALS — BP 107/70 | HR 92 | Resp 20 | Ht 72.0 in | Wt 330.0 lb

## 2022-03-03 DIAGNOSIS — C159 Malignant neoplasm of esophagus, unspecified: Secondary | ICD-10-CM | POA: Diagnosis not present

## 2022-03-03 MED ORDER — GOLYTELY 236 G PO SOLR: 4000.0000 mL | Freq: Once | ORAL | 0 refills | Status: AC

## 2022-03-08 ENCOUNTER — Telehealth (HOSPITAL_COMMUNITY): Payer: Self-pay | Admitting: *Deleted

## 2022-03-08 NOTE — Telephone Encounter (Signed)
Patient given detailed instructions per Myocardial Perfusion Study Information Sheet for the test on 03/15/22 Patient notified to arrive 15 minutes early and that it is imperative to arrive on time for appointment to keep from having the test rescheduled.  If you need to cancel or reschedule your appointment, please call the office within 24 hours of your appointment. . Patient verbalized understanding. Kirstie Peri

## 2022-03-09 NOTE — Progress Notes (Signed)
  Radiation Oncology         [phone] ________________________________  Name: Keith Moreno MRN: [id number]  Date of Service: 03/20/2022  DOB: 01-Jan-1961  Post Treatment Telephone Note  Diagnosis:   cT3NxM0 adenocarcinoma of the distal esophagus  Intent: Curative  Radiation Treatment Dates: 12/26/2021 through 02/02/2022 Site Technique Total Dose (Gy) Dose per Fx (Gy) Completed Fx Beam Energies  Esophagus: Esoph IMRT 45/45 1.8 25/25 6X  Esophagus: Esoph_Bst IMRT 5.4/5.4 1.8 3/3 6X   Narrative: The patient tolerated radiation therapy relatively well. The patient developed fatigue, esophagitis, and slight erythema in the skin along the treatment field. He's doing well and getting ready to proceed with esophagectomy with [name] later this month.   Impression/Plan: 1. cT3NxM0 adenocarcinoma of the distal esophagus. The patient has been doing well since completion of radiotherapy. We discussed that we would be happy to continue to follow him as needed, but he will also continue to follow up with Dr. Lorenso Courier in medical oncology and [name] in [facility].      Carola Rhine, PAC

## 2022-03-15 ENCOUNTER — Ambulatory Visit (HOSPITAL_COMMUNITY): Payer: No Typology Code available for payment source | Attending: Thoracic Surgery (Cardiothoracic Vascular Surgery)

## 2022-03-15 DIAGNOSIS — C159 Malignant neoplasm of esophagus, unspecified: Secondary | ICD-10-CM | POA: Diagnosis not present

## 2022-03-15 DIAGNOSIS — Z01810 Encounter for preprocedural cardiovascular examination: Secondary | ICD-10-CM | POA: Diagnosis not present

## 2022-03-15 MED ORDER — TECHNETIUM TC 99M TETROFOSMIN IV KIT
32.1000 | PACK | Freq: Once | INTRAVENOUS | Status: AC | PRN
  Administered 2022-03-15: 32.1 via INTRAVENOUS

## 2022-03-15 MED ORDER — REGADENOSON 0.4 MG/5ML IV SOLN
0.4000 mg | Freq: Once | INTRAVENOUS | Status: AC
  Administered 2022-03-15: 0.4 mg via INTRAVENOUS

## 2022-03-16 ENCOUNTER — Ambulatory Visit (HOSPITAL_COMMUNITY): Payer: No Typology Code available for payment source | Attending: Thoracic Surgery (Cardiothoracic Vascular Surgery)

## 2022-03-16 LAB — MYOCARDIAL PERFUSION IMAGING
LV dias vol: 88 mL (ref 62–150)
LV sys vol: 46 mL
Nuc Stress EF: 47 %
Peak HR: 93 {beats}/min
Rest HR: 83 {beats}/min
Rest Nuclear Isotope Dose: 30.4 mCi
SDS: 0
SRS: 0
SSS: 0
ST Depression (mm): 0 mm
Stress Nuclear Isotope Dose: 32.1 mCi
TID: 0.92

## 2022-03-16 MED ORDER — TECHNETIUM TC 99M TETROFOSMIN IV KIT
30.4000 | PACK | Freq: Once | INTRAVENOUS | Status: AC | PRN
  Administered 2022-03-16: 30.4 via INTRAVENOUS

## 2022-03-20 ENCOUNTER — Ambulatory Visit
Admission: RE | Admit: 2022-03-20 | Discharge: 2022-03-20 | Disposition: A | Discharge status: Home / Self Care | Payer: No Typology Code available for payment source | Source: Ambulatory Visit | Attending: Radiation Oncology | Admitting: Radiation Oncology

## 2022-03-20 DIAGNOSIS — C155 Malignant neoplasm of lower third of esophagus: Secondary | ICD-10-CM

## 2022-03-31 ENCOUNTER — Emergency Department (HOSPITAL_COMMUNITY): Payer: No Typology Code available for payment source

## 2022-03-31 ENCOUNTER — Emergency Department (HOSPITAL_COMMUNITY)
Admission: EM | Admit: 2022-03-31 | Discharge: 2022-03-31 | Disposition: A | Discharge status: Home / Self Care | Payer: No Typology Code available for payment source | Attending: Emergency Medicine | Admitting: Emergency Medicine

## 2022-03-31 ENCOUNTER — Encounter (HOSPITAL_COMMUNITY): Payer: Self-pay

## 2022-03-31 ENCOUNTER — Other Ambulatory Visit: Payer: Self-pay

## 2022-03-31 DIAGNOSIS — C159 Malignant neoplasm of esophagus, unspecified: Secondary | ICD-10-CM | POA: Diagnosis not present

## 2022-03-31 DIAGNOSIS — R799 Abnormal finding of blood chemistry, unspecified: Secondary | ICD-10-CM | POA: Diagnosis present

## 2022-03-31 LAB — CBC WITH DIFFERENTIAL/PLATELET
Abs Immature Granulocytes: 0.01 10*3/uL (ref 0.00–0.07)
Basophils Absolute: 0 10*3/uL (ref 0.0–0.1)
Basophils Relative: 1 %
Eosinophils Absolute: 0.1 10*3/uL (ref 0.0–0.5)
Eosinophils Relative: 2 %
HCT: 30.6 % — ABNORMAL LOW (ref 39.0–52.0)
Hemoglobin: 10.2 g/dL — ABNORMAL LOW (ref 13.0–17.0)
Immature Granulocytes: 0 %
Lymphocytes Relative: 5 %
Lymphs Abs: 0.2 10*3/uL — ABNORMAL LOW (ref 0.7–4.0)
MCH: 30.8 pg (ref 26.0–34.0)
MCHC: 33.3 g/dL (ref 30.0–36.0)
MCV: 92.4 fL (ref 80.0–100.0)
Monocytes Absolute: 0.4 10*3/uL (ref 0.1–1.0)
Monocytes Relative: 10 %
Neutro Abs: 3.3 10*3/uL (ref 1.7–7.7)
Neutrophils Relative %: 82 %
Platelets: 156 10*3/uL (ref 150–400)
RBC: 3.31 MIL/uL — ABNORMAL LOW (ref 4.22–5.81)
RDW: 14.2 % (ref 11.5–15.5)
WBC: 4.1 10*3/uL (ref 4.0–10.5)
nRBC: 0 % (ref 0.0–0.2)

## 2022-03-31 LAB — COMPREHENSIVE METABOLIC PANEL
ALT: 16 U/L (ref 0–44)
AST: 17 U/L (ref 15–41)
Albumin: 3.9 g/dL (ref 3.5–5.0)
Alkaline Phosphatase: 75 U/L (ref 38–126)
Anion gap: 9 (ref 5–15)
BUN: 40 mg/dL — ABNORMAL HIGH (ref 8–23)
CO2: 25 mmol/L (ref 22–32)
Calcium: 14.7 mg/dL (ref 8.9–10.3)
Chloride: 102 mmol/L (ref 98–111)
Creatinine, Ser: 1.64 mg/dL — ABNORMAL HIGH (ref 0.61–1.24)
GFR, Estimated: 47 mL/min — ABNORMAL LOW (ref >60)
Glucose, Bld: 127 mg/dL — ABNORMAL HIGH (ref 70–99)
Potassium: 4 mmol/L (ref 3.5–5.1)
Sodium: 136 mmol/L (ref 135–145)
Total Bilirubin: 0.7 mg/dL (ref 0.3–1.2)
Total Protein: 6.7 g/dL (ref 6.5–8.1)

## 2022-03-31 LAB — MAGNESIUM: Magnesium: 2 mg/dL (ref 1.7–2.4)

## 2022-03-31 MED ORDER — SODIUM CHLORIDE 0.9 % IV BOLUS
1000.0000 mL | Freq: Once | INTRAVENOUS | Status: AC
  Administered 2022-03-31: 1000 mL via INTRAVENOUS

## 2022-03-31 NOTE — ED Provider Triage Note (Cosign Needed Addendum)
Emergency Medicine Provider Triage Evaluation Note  Abdulrahim Siddiqi , a 61 y.o. male  was evaluated in triage.  Pt complains of concerns for elevated calcium onset prior to arrival.  Was evaluated by his doctor at the [place] who told him to come to the emergency department due to elevated calcium.  Has associated shortness of breath.  Denies chest pain, nausea, vomiting, abdominal pain, urinary symptoms, spasms.  Review of Systems  Positive: Negative:   Physical Exam  BP 130/68   Pulse 76   Temp 97.8 F (36.6 C) (Oral)   Resp (!) 25   SpO2 100%  Gen:   Awake, no distress   Resp:  Normal effort  MSK:   Moves extremities without difficulty  Other:    Medical Decision Making  Medically screening exam initiated at 2:39 PM.  Appropriate orders placed.  Kamron Vanwyhe was informed that the remainder of the evaluation will be completed by another provider, this initial triage assessment does not replace that evaluation, and the importance of remaining in the ED until their evaluation is complete.  Work-up initiated   Sriman Tally A, PA-C 03/31/22 1444   4:30 PM - notified by RN that patient calcium elevated at 14.7. notified RN that patient is in need of a room for treatment. RN working on room placement.   Trust Leh A, PA-C 03/31/22 1630

## 2022-03-31 NOTE — Pre-Procedure Instructions (Signed)
Surgical Instructions    Your procedure is scheduled on April 05, 2022.  Report to [facility] Main Entrance "A" at 5:30 A.M., then check in with the Admitting office.  Call this number if you have problems the morning of surgery:  [phone]   If you have any questions prior to your surgery date call [phone]: Open Monday-Friday 8am-4pm    Remember:  Do not eat or drink after midnight the night before your surgery      Take these medicines the morning of surgery with A SIP OF WATER:  amLODipine (NORVASC)   olopatadine (PATANOL)   Take these medicines the morning of surgery with a sip of water AS NEEDED:  ondansetron (ZOFRAN-ODT)  prochlorperazine (COMPAZINE)    As of today, STOP taking any Aspirin (unless otherwise instructed by your surgeon) Aleve, Naproxen, Ibuprofen, Motrin, Advil, Goody's, BC's, all herbal medications, fish oil, and all vitamins.   WHAT DO I DO ABOUT MY DIABETES MEDICATION?   Do not take Alogliptin Benzoate or pioglitazone (ACTOS) the morning of surgery.   HOW TO MANAGE YOUR DIABETES BEFORE AND AFTER SURGERY  Why is it important to control my blood sugar before and after surgery? Improving blood sugar levels before and after surgery helps healing and can limit problems. A way of improving blood sugar control is eating a healthy diet by:  Eating less sugar and carbohydrates  Increasing activity/exercise  Talking with your doctor about reaching your blood sugar goals High blood sugars (greater than 180 mg/dL) can raise your risk of infections and slow your recovery, so you will need to focus on controlling your diabetes during the weeks before surgery. Make sure that the doctor who takes care of your diabetes knows about your planned surgery including the date and location.  How do I manage my blood sugar before surgery? Check your blood sugar at least 4 times a day, starting 2 days before surgery, to make sure that the level is not too high  or low.  Check your blood sugar the morning of your surgery when you wake up and every 2 hours until you get to the Short Stay unit.  If your blood sugar is less than 70 mg/dL, you will need to treat for low blood sugar: Do not take insulin. Treat a low blood sugar (less than 70 mg/dL) with  cup of clear juice (cranberry or apple), 4 glucose tablets, OR glucose gel. Recheck blood sugar in 15 minutes after treatment (to make sure it is greater than 70 mg/dL). If your blood sugar is not greater than 70 mg/dL on recheck, call [phone] for further instructions. Report your blood sugar to the short stay nurse when you get to Short Stay.  If you are admitted to the hospital after surgery: Your blood sugar will be checked by the staff and you will probably be given insulin after surgery (instead of oral diabetes medicines) to make sure you have good blood sugar levels. The goal for blood sugar control after surgery is 80-180 mg/dL.                      Do NOT Smoke (Tobacco/Vaping) for 24 hours prior to your procedure.  If you use a CPAP at night, you may bring your mask/headgear for your overnight stay.   Contacts, glasses, piercing's, hearing aid's, dentures or partials may not be worn into surgery, please bring cases for these belongings.    For patients admitted to the hospital, discharge time  will be determined by your treatment team.   Patients discharged the day of surgery will not be allowed to drive home, and someone needs to stay with them for 24 hours.  SURGICAL WAITING ROOM VISITATION Patients having surgery or a procedure may have no more than 2 support people in the waiting area - these visitors may rotate.   Children under the age of 59 must have an adult with them who is not the patient. If the patient needs to stay at the hospital during part of their recovery, the visitor guidelines for inpatient rooms apply. Pre-op nurse will coordinate an appropriate time for 1 support  person to accompany patient in pre-op.  This support person may not rotate.   Please refer to the [facility] website for the visitor guidelines for Inpatients (after your surgery is over and you are in a regular room).    Special instructions:   [place]- Preparing For Surgery  Before surgery, you can play an important role. Because skin is not sterile, your skin needs to be as free of germs as possible. You can reduce the number of germs on your skin by washing with CHG (chlorahexidine gluconate) Soap before surgery.  CHG is an antiseptic cleaner which kills germs and bonds with the skin to continue killing germs even after washing.    Oral Hygiene is also important to reduce your risk of infection.  Remember - BRUSH YOUR TEETH THE MORNING OF SURGERY WITH YOUR REGULAR TOOTHPASTE  Please do not use if you have an allergy to CHG or antibacterial soaps. If your skin becomes reddened/irritated stop using the CHG.  Do not shave (including legs and underarms) for at least 48 hours prior to first CHG shower. It is OK to shave your face.  Please follow these instructions carefully.   Shower the NIGHT BEFORE SURGERY and the MORNING OF SURGERY  If you chose to wash your hair, wash your hair first as usual with your normal shampoo.  After you shampoo, rinse your hair and body thoroughly to remove the shampoo.  Use CHG Soap as you would any other liquid soap. You can apply CHG directly to the skin and wash gently with a scrungie or a clean washcloth.   Apply the CHG Soap to your body ONLY FROM THE NECK DOWN.  Do not use on open wounds or open sores. Avoid contact with your eyes, ears, mouth and genitals (private parts). Wash Face and genitals (private parts)  with your normal soap.   Wash thoroughly, paying special attention to the area where your surgery will be performed.  Thoroughly rinse your body with warm water from the neck down.  DO NOT shower/wash with your normal soap after using and  rinsing off the CHG Soap.  Pat yourself dry with a CLEAN TOWEL.  Wear CLEAN PAJAMAS to bed the night before surgery  Place CLEAN SHEETS on your bed the night before your surgery  DO NOT SLEEP WITH PETS.   Day of Surgery: Take a shower with CHG soap. Do not wear jewelry or makeup Do not wear lotions, powders, perfumes/colognes, or deodorant. Do not shave 48 hours prior to surgery.  Men may shave face and neck. Do not bring valuables to the hospital.  [facility] is not responsible for any belongings or valuables. Do not wear nail polish, gel polish, artificial nails, or any other type of covering on natural nails (fingers and toes) If you have artificial nails or gel coating that need to  be removed by a nail salon, please have this removed prior to surgery. Artificial nails or gel coating may interfere with anesthesia's ability to adequately monitor your vital signs.  Wear Clean/Comfortable clothing the morning of surgery Remember to brush your teeth WITH YOUR REGULAR TOOTHPASTE.   Please read over the following fact sheets that you were given.    If you received a COVID test during your pre-op visit  it is requested that you wear a mask when out in public, stay away from anyone that may not be feeling well and notify your surgeon if you develop symptoms. If you have been in contact with anyone that has tested positive in the last 10 days please notify you surgeon.

## 2022-03-31 NOTE — ED Triage Notes (Signed)
Pt arrived POV from home stating he had a check up at the doctors office today and they called and told him his calcium was high.

## 2022-03-31 NOTE — ED Triage Notes (Signed)
Abn calcium reported  acuity increased per pa

## 2022-03-31 NOTE — Discharge Instructions (Signed)
You were seen in the emergency department for elevated calcium level.  Your other lab counts were stable.  You were given some IV fluids.  Please contact Dr. Lorenso Courier for close follow-up.  Stay well-hydrated.  Return to the emergency department if any worsening or concerning symptoms

## 2022-03-31 NOTE — ED Provider Notes (Signed)
[facility] EMERGENCY DEPARTMENT Provider Note   CSN: [id number] Arrival date & time: 03/31/22  1401     History {Add pertinent medical, surgical, social history, OB history to HPI:1} Chief Complaint  Patient presents with   Abnormal Lab    Keith Moreno is a 61 y.o. male.  He has a history of esophageal adenocarcinoma and follows with Dr. Lorenso Courier.  He has been receiving chemotherapy and is now receiving radiation.  He had normal blood work done by his primary care doctor at the [place] yesterday and was called today that his calcium was elevated.  He denies any specific complaints but has some chronic weakness with ambulation and chronic constipation.  He does not feel like he has been eating and drinking well but is sticking with his pured and soft diet.  No fevers or chills chest pain.  He feels his shortness of breath is at baseline.  The history is provided by the patient and the spouse.  Abnormal Lab Time since result:  1 day Patient referred by:  PCP Resulting agency:  External Result type: chemistry   Chemistry:    Calcium:  High      Home Medications Prior to Admission medications   Medication Sig Start Date End Date Taking? Authorizing Provider  Alogliptin Benzoate 25 MG TABS Take 12.5 mg by mouth at bedtime. 09/30/21   [provider]  amLODipine (NORVASC) 10 MG tablet Take 10 mg by mouth at bedtime.    [provider]  Ascorbic Acid (VITAMIN C) 1000 MG tablet Take 2,000 mg by mouth every morning.    [provider]  Cholecalciferol (VITAMIN D3) 50 MCG (2000 UT) capsule Take 20,000 Units by mouth at bedtime.    [provider]  CINNAMON PO Take 4,000 mg by mouth 2 (two) times daily.    [provider]  Cyanocobalamin (B-12 PO) Take 1 tablet by mouth daily.    [provider]  diphenhydramine-acetaminophen (TYLENOL PM) 25-500 MG TABS tablet Take 2 tablets by mouth at bedtime. 12/21/21   Mansouraty,  Telford Nab., MD  FERROUS SULFATE PO Take 1 tablet by mouth daily.    [provider]  lidocaine (LIDODERM) 5 % Place 1 patch onto the skin daily as needed (pain). 07/26/21   [provider]  lidocaine-prilocaine (EMLA) cream Apply 1 application. topically as needed. 12/15/21   Lincoln Brigham, PA-C  lisinopril (PRINIVIL,ZESTRIL) 40 MG tablet Take 40 mg by mouth at bedtime.    [provider]  Melatonin 10 MG TABS Take 40 mg by mouth at bedtime as needed (Sleep).    [provider]  Multiple Vitamin (MULTIVITAMIN WITH MINERALS) TABS tablet Take 1 tablet by mouth daily.    [provider]  olopatadine (PATANOL) 0.1 % ophthalmic solution Place 1 drop into both eyes 2 (two) times daily. 10/28/21   [provider]  ondansetron (ZOFRAN-ODT) 8 MG disintegrating tablet Take 1 tablet (8 mg total) by mouth every 8 (eight) hours as needed for nausea or vomiting. 12/15/21   Lincoln Brigham, PA-C  pioglitazone (ACTOS) 30 MG tablet Take 30 mg by mouth every morning. 09/30/21   [provider]  prochlorperazine (COMPAZINE) 10 MG tablet Take 1 tablet (10 mg total) by mouth every 6 (six) hours as needed for nausea or vomiting. 12/15/21   Lincoln Brigham, PA-C  vitamin E 180 MG (400 UNITS) capsule Take 1,200 Units by mouth 2 (two) times daily.    [provider]      Allergies    Procaine, Chocolate, and Other    Review of Systems   Review of Systems  Constitutional:  Negative for fever.  Eyes:  Negative for visual disturbance.  Respiratory:  Positive for shortness of breath.   Cardiovascular:  Negative for chest pain.  Gastrointestinal:  Positive for constipation.  Genitourinary:  Negative for dysuria.    Physical Exam Updated Vital Signs BP 135/78 (BP Location: Left Arm)   Pulse 70   Temp 98.1 F (36.7 C) (Oral)   Resp 16   Ht 6' (1.829 m)   Wt (!) 151.5 kg   SpO2 100%   BMI 45.30 kg/m  Physical Exam Vitals and nursing note  reviewed.  Constitutional:      General: He is not in acute distress.    Appearance: Normal appearance. He is well-developed. He is obese.  HENT:     Head: Normocephalic and atraumatic.  Eyes:     Conjunctiva/sclera: Conjunctivae normal.  Cardiovascular:     Rate and Rhythm: Normal rate and regular rhythm.     Heart sounds: No murmur heard. Pulmonary:     Effort: Pulmonary effort is normal. No respiratory distress.     Breath sounds: Normal breath sounds.  Abdominal:     Palpations: Abdomen is soft.     Tenderness: There is no abdominal tenderness. There is no guarding or rebound.  Musculoskeletal:        General: No swelling.     Cervical back: Neck supple.  Skin:    General: Skin is warm and dry.     Capillary Refill: Capillary refill takes less than 2 seconds.  Neurological:     General: No focal deficit present.     Mental Status: He is alert.     ED Results / Procedures / Treatments   Labs (all labs ordered are listed, but only abnormal results are displayed) Labs Reviewed  COMPREHENSIVE METABOLIC PANEL - Abnormal; Notable for the following components:      Result Value   Glucose, Bld 127 (*)    BUN 40 (*)    Creatinine, Ser 1.64 (*)    Calcium 14.7 (*)    GFR, Estimated 47 (*)    All other components within normal limits  CBC WITH DIFFERENTIAL/PLATELET - Abnormal; Notable for the following components:   RBC 3.31 (*)    Hemoglobin 10.2 (*)    HCT 30.6 (*)    Lymphs Abs 0.2 (*)    All other components within normal limits  PARATHYROID HORMONE, INTACT (NO CA)  CALCIUM, IONIZED  MAGNESIUM    EKG EKG Interpretation  Date/Time:  Friday March 31 2022 14:47:45 EDT Ventricular Rate:  74 PR Interval:  272 QRS Duration: 158 QT Interval:  410 QTC Calculation: 455 R Axis:   112 Text Interpretation: Sinus rhythm with 1st degree A-V block Right bundle branch block Abnormal ECG When compared with ECG of 14-Dec-2021 07:07, No significant change since last  tracing Confirmed by Aletta Edouard [phone]) on 03/31/2022 4:44:07 PM  Radiology DG Chest 2 View  Result Date: 03/31/2022 CLINICAL DATA:  Shortness of breath EXAM: CHEST - 2 VIEW COMPARISON:  Previous PET-CT done on 03/01/2022 FINDINGS: Transverse diameter of heart is in the upper limits of normal. There is prominence of both hilar regions. There are no signs of pulmonary edema. There are linear densities in the medial right lower lung field. There is no pleural effusion or pneumothorax. Tip of right IJ chest port  is seen in superior vena cava. IMPRESSION: There are linear densities in the medial right lower lung fields suggesting scarring or subsegmental atelectasis. Hilar regions are prominent, more so on the left side suggesting possible lymphadenopathy or prominent central pulmonary vessels. Electronically Signed   By: Elmer Picker M.D.   On: 03/31/2022 15:32    Procedures Procedures  {Document cardiac monitor, telemetry assessment procedure when appropriate:1}  Medications Ordered in ED Medications  sodium chloride 0.9 % bolus 1,000 mL (has no administration in time range)    ED Course/ Medical Decision Making/ A&P                           Medical Decision Making Amount and/or Complexity of Data Reviewed Labs: ordered.   ***  {Document critical care time when appropriate:1} {Document review of labs and clinical decision tools ie heart score, Chads2Vasc2 etc:1}  {Document your independent review of radiology images, and any outside records:1} {Document your discussion with family members, caretakers, and with consultants:1} {Document social determinants of health affecting pt's care:1} {Document your decision making why or why not admission, treatments were needed:1} Final Clinical Impression(s) / ED Diagnoses Final diagnoses:  None    Rx / DC Orders ED Discharge Orders     None

## 2022-04-01 LAB — CALCITRIOL (1,25 DI-OH VIT D): Vit D, 1,25-Dihydroxy: 77.9 pg/mL (ref 24.8–81.5)

## 2022-04-02 LAB — CALCIUM, IONIZED: Calcium, Ionized, Serum: 8.6 mg/dL — ABNORMAL HIGH (ref 4.5–5.6)

## 2022-04-02 LAB — PARATHYROID HORMONE, INTACT (NO CA): PTH: 64 pg/mL (ref 15–65)

## 2022-04-03 ENCOUNTER — Ambulatory Visit (HOSPITAL_COMMUNITY)
Admission: RE | Admit: 2022-04-03 | Discharge: 2022-04-03 | Disposition: A | Discharge status: Home / Self Care | Payer: No Typology Code available for payment source | Source: Ambulatory Visit | Attending: Thoracic Surgery (Cardiothoracic Vascular Surgery) | Admitting: Thoracic Surgery (Cardiothoracic Vascular Surgery)

## 2022-04-03 ENCOUNTER — Encounter: Payer: Self-pay | Admitting: Hematology and Oncology

## 2022-04-03 ENCOUNTER — Other Ambulatory Visit: Payer: Self-pay

## 2022-04-03 ENCOUNTER — Encounter (HOSPITAL_COMMUNITY)
Admission: RE | Admit: 2022-04-03 | Discharge: 2022-04-03 | Disposition: A | Discharge status: Home / Self Care | Payer: No Typology Code available for payment source | Source: Ambulatory Visit | Attending: Thoracic Surgery (Cardiothoracic Vascular Surgery) | Admitting: Thoracic Surgery (Cardiothoracic Vascular Surgery)

## 2022-04-03 ENCOUNTER — Encounter (HOSPITAL_COMMUNITY): Payer: Self-pay

## 2022-04-03 VITALS — BP 135/80 | HR 72 | Temp 97.7°F | Resp 18 | Ht 72.0 in | Wt 331.4 lb

## 2022-04-03 DIAGNOSIS — Z87891 Personal history of nicotine dependence: Secondary | ICD-10-CM | POA: Insufficient documentation

## 2022-04-03 DIAGNOSIS — Z20822 Contact with and (suspected) exposure to covid-19: Secondary | ICD-10-CM | POA: Diagnosis not present

## 2022-04-03 DIAGNOSIS — Z01818 Encounter for other preprocedural examination: Secondary | ICD-10-CM | POA: Diagnosis present

## 2022-04-03 DIAGNOSIS — Z6841 Body Mass Index (BMI) 40.0 and over, adult: Secondary | ICD-10-CM | POA: Diagnosis not present

## 2022-04-03 DIAGNOSIS — E119 Type 2 diabetes mellitus without complications: Secondary | ICD-10-CM | POA: Insufficient documentation

## 2022-04-03 DIAGNOSIS — C159 Malignant neoplasm of esophagus, unspecified: Secondary | ICD-10-CM

## 2022-04-03 DIAGNOSIS — R9431 Abnormal electrocardiogram [ECG] [EKG]: Secondary | ICD-10-CM | POA: Diagnosis not present

## 2022-04-03 DIAGNOSIS — I10 Essential (primary) hypertension: Secondary | ICD-10-CM | POA: Diagnosis not present

## 2022-04-03 DIAGNOSIS — F4310 Post-traumatic stress disorder, unspecified: Secondary | ICD-10-CM | POA: Insufficient documentation

## 2022-04-03 HISTORY — DX: Dyspnea, unspecified: R06.00

## 2022-04-03 HISTORY — DX: Malignant (primary) neoplasm, unspecified: C80.1

## 2022-04-03 HISTORY — DX: Headache, unspecified: R51.9

## 2022-04-03 LAB — URINALYSIS, ROUTINE W REFLEX MICROSCOPIC
Bilirubin Urine: NEGATIVE
Glucose, UA: NEGATIVE mg/dL
Hgb urine dipstick: NEGATIVE
Ketones, ur: NEGATIVE mg/dL
Leukocytes,Ua: NEGATIVE
Nitrite: NEGATIVE
Protein, ur: NEGATIVE mg/dL
Specific Gravity, Urine: 1.02 (ref 1.005–1.030)
pH: 6 (ref 5.0–8.0)

## 2022-04-03 LAB — COMPREHENSIVE METABOLIC PANEL
ALT: 17 U/L (ref 0–44)
AST: 20 U/L (ref 15–41)
Albumin: 3.8 g/dL (ref 3.5–5.0)
Alkaline Phosphatase: 65 U/L (ref 38–126)
Anion gap: 10 (ref 5–15)
BUN: 22 mg/dL (ref 8–23)
CO2: 19 mmol/L — ABNORMAL LOW (ref 22–32)
Calcium: 13.3 mg/dL (ref 8.9–10.3)
Chloride: 106 mmol/L (ref 98–111)
Creatinine, Ser: 1.45 mg/dL — ABNORMAL HIGH (ref 0.61–1.24)
GFR, Estimated: 55 mL/min — ABNORMAL LOW (ref >60)
Glucose, Bld: 101 mg/dL — ABNORMAL HIGH (ref 70–99)
Potassium: 3.6 mmol/L (ref 3.5–5.1)
Sodium: 135 mmol/L (ref 135–145)
Total Bilirubin: 0.5 mg/dL (ref 0.3–1.2)
Total Protein: 6.6 g/dL (ref 6.5–8.1)

## 2022-04-03 LAB — TYPE AND SCREEN
ABO/RH(D): O POS
Antibody Screen: NEGATIVE

## 2022-04-03 LAB — BLOOD GAS, ARTERIAL
Acid-base deficit: 0.5 mmol/L (ref 0.0–2.0)
Bicarbonate: 23.1 mmol/L (ref 20.0–28.0)
Drawn by: 587931
O2 Saturation: 100 %
Patient temperature: 37
pCO2 arterial: 34 mmHg (ref 32–48)
pH, Arterial: 7.44 (ref 7.35–7.45)
pO2, Arterial: 102 mmHg (ref 83–108)

## 2022-04-03 LAB — HEMOGLOBIN A1C
Hgb A1c MFr Bld: 5.1 % (ref 4.8–5.6)
Mean Plasma Glucose: 99.67 mg/dL

## 2022-04-03 LAB — PROTIME-INR
INR: 1.1 (ref 0.8–1.2)
Prothrombin Time: 14.4 seconds (ref 11.4–15.2)

## 2022-04-03 LAB — GLUCOSE, CAPILLARY: Glucose-Capillary: 105 mg/dL — ABNORMAL HIGH (ref 70–99)

## 2022-04-03 LAB — CBC
HCT: 31 % — ABNORMAL LOW (ref 39.0–52.0)
Hemoglobin: 10.5 g/dL — ABNORMAL LOW (ref 13.0–17.0)
MCH: 31.2 pg (ref 26.0–34.0)
MCHC: 33.9 g/dL (ref 30.0–36.0)
MCV: 92 fL (ref 80.0–100.0)
Platelets: 176 10*3/uL (ref 150–400)
RBC: 3.37 MIL/uL — ABNORMAL LOW (ref 4.22–5.81)
RDW: 13.9 % (ref 11.5–15.5)
WBC: 4.6 10*3/uL (ref 4.0–10.5)
nRBC: 0 % (ref 0.0–0.2)

## 2022-04-03 LAB — ABO/RH: ABO/RH(D): O POS

## 2022-04-03 LAB — APTT: aPTT: 29 seconds (ref 24–36)

## 2022-04-03 LAB — SURGICAL PCR SCREEN
MRSA, PCR: NEGATIVE
Staphylococcus aureus: NEGATIVE

## 2022-04-03 NOTE — Progress Notes (Signed)
PCP - [facility] Cardiologist - Denies  PPM/ICD - Denies Device Orders - n/a Rep Notified - n/a  Chest x-ray - 04/03/2022 EKG - 03/31/2022 Stress Test - 03/16/2022 ECHO - Denies Cardiac Cath - Denies  Sleep Study - Denies CPAP - n/a  Pt states that he is Pre-Diabetic, but he taking two different DM mediations. He checks his CBG 1x/day fasting and results are between 80-90. Today at PAT appointment, CBG 105 and he has not had anything to eat/drink today other than water.  Blood Thinner Instructions: n/a Aspirin Instructions: n/a  NPO after midnight  COVID TEST- Yes. Result Pending   Anesthesia review: Yes. Discussed with Myra Gianotti, PA-C. Stress test that was ordered recommended Echo follow-up, but no echo done.  Patient denies shortness of breath, fever, cough and chest pain at PAT appointment   All instructions explained to the patient, with a verbal understanding of the material. Patient agrees to go over the instructions while at home for a better understanding. Patient also instructed to self quarantine after being tested for COVID-19. The opportunity to ask questions was provided.

## 2022-04-04 LAB — SARS CORONAVIRUS 2 (TAT 6-24 HRS): SARS Coronavirus 2: NEGATIVE

## 2022-04-04 NOTE — Anesthesia Preprocedure Evaluation (Signed)
Anesthesia Evaluation  Patient identified by MRN, date of birth, ID band Patient awake    Reviewed: Allergy & Precautions, NPO status , Patient's Chart, lab work & pertinent test results  Airway Mallampati: I  TM Distance: >3 FB Neck ROM: Full    Dental  (+) Missing, Edentulous Upper   Pulmonary former smoker,    Pulmonary exam normal        Cardiovascular hypertension, Pt. on medications Normal cardiovascular exam     Neuro/Psych  Headaches, PSYCHIATRIC DISORDERS Anxiety    GI/Hepatic negative GI ROS, Neg liver ROS,   Endo/Other  Morbid obesity  Renal/GU negative Renal ROS     Musculoskeletal negative musculoskeletal ROS (+)   Abdominal   Peds  Hematology negative hematology ROS (+)   Anesthesia Other Findings ESOPHAGEAL CANCER  Reproductive/Obstetrics                            Anesthesia Physical Anesthesia Plan  ASA: 3  Anesthesia Plan: General   Post-op Pain Management:    Induction: Intravenous  PONV Risk Score and Plan: 2 and Ondansetron, Dexamethasone and Treatment may vary due to age or medical condition  Airway Management Planned: Oral ETT  Additional Equipment:   Intra-op Plan:   Post-operative Plan: Possible Post-op intubation/ventilation  Informed Consent: I have reviewed the patients History and Physical, chart, labs and discussed the procedure including the risks, benefits and alternatives for the proposed anesthesia with the patient or authorized representative who has indicated his/her understanding and acceptance.     Dental advisory given  Plan Discussed with: CRNA  Anesthesia Plan Comments: (PAT note written 04/04/2022 by Myra Gianotti, PA-C. )       Anesthesia Quick Evaluation

## 2022-04-04 NOTE — Progress Notes (Addendum)
Anesthesia Chart Review:  Case: [id number] Date/Time: 04/05/22 0715   Procedures:      XI ROBOTIC ASSISTED IVOR LEWIS ESOPHAGECTOMY (Chest)     XI ROBOTIC ASSISTED JEJUNOSTOMY TUBE PLACEMENT     ESOPHAGOGASTRODUODENOSCOPY (EGD)   Anesthesia type: General   Pre-op diagnosis: ESOPHAGEAL CANCER   Location: [facility] OR ROOM 10 / [place] [facility]   Surgeons: Lajuana Matte, MD       DISCUSSION: Patient is a 61 year old male scheduled for the above procedure.  History includes former smoker (quit 07/10/81), HTN, DM2, PTSD, esophageal cancer (diagnosed May 2023, s/p chemoradiation), dyspnea (associated with chemotherapy), hypercalcemia (noted in March 2023), right arm fracture (s/p ORIF 2017).  BMI is consistent with morbid obesity.  He presented to [facility] ED on 11/13/21 with dysphagia. He had a pending barium swallow study at the [facility] but prior to arrival he had esophageal meat impaction and was unable to swallow liquids or saliva. He underwent emergency EGD by Dr. Hilarie Fredrickson with food removal and finding of fungating mass in the lower third of the esophagus with pathology positive for poorly differentiated adenocarcinoma. Imaging showed mediastinal adenopathy, so he underwent bronchoscopy and upper EUS on 12/14/21. Cytology showed no malignant cells from LUL lung brushings and from lymph nodes sampled. Stomach biopsy also showed no malignancy. He is now s/p radiation for esophageal cancer (completed 02/02/22). He stopped chemotherapy early due to neutropenia.   ED visit 03/31/22 after [facility] labs revealed a serum Calcium of 14.3. Labs rechecked at [facility] ED showing Calcium 14.7, ionized serum calcium 8.6 (4.5-5.6), Vitamin D within normal limits at 77.9, magnesium 2.0, PTH high normal at 64 (15-65). Notes dating back to at least May 2023 indicate that his [place] PCP was monitoring hypercalcemia, but also diagnosed around the same time of his esophageal cancer. In [facility], his Calcium had previously ranged from  9.9-12.8 since 12/12/21.   At 03/03/22 visit with [name], Zubrod score classified as 0 (normal activity, no symptoms). Given surgery plans, [name] ordered a preoperative stress test which was non-ischemic on 03/16/22, however EF 47% (45-54%), although PACs and PVCs could have effected gating. Given mild LV systolic dysfunction, the interpreting cardiologist suggested a follow-up echo to confirm EF, but [name] did not order. (I discussed this with anesthesiologist Hoy Morn, MD on 04/03/22.)   Due to recent ED visit for critical hypercalcemia, labs were repeated on his 04/03/22 PAT visit. Lab again called with a critical Calcium of 13.3, and [name] notified. Creatinine stable at 1.45. H/H stable at 10.5/31.0. Reviewed recent critical hypercalcemia with anesthesiologist Annye Asa, MD. Given hypercalcemia is in the setting of esophageal cancer, she would defer additional preoperative recommendations to Dr. Ayesha Mohair confirmed he is okay to proceed as scheduled.   04/03/22 presurgical COVID-19 test negative.  04/03/22 CXR is still in process.   VS: BP 135/80   Pulse 72   Temp 36.5 C (Oral)   Resp 18   Ht 6' (1.829 m)   Wt (!) 150.3 kg   SpO2 99%   BMI 44.95 kg/m    PROVIDERS: [facility] Providers: PCP: Herold Harms, MD HEM-ONCCarlean Jews, MD & Monica Martinez, MD GI: Rolla Etienne, PA-C   [place] Providers: HEM-ONCNarda Rutherford, MD RAD-ONC: Kyung Rudd, MD  GI: Zenovia Jarred, MD & Justice Britain, MD Fatima SangerJune Leap, DO   LABS: See DISCUSSION. (all labs ordered are listed, but only abnormal results are displayed)  Labs Reviewed  GLUCOSE, CAPILLARY - Abnormal; Notable  for the following components:      Result Value   Glucose-Capillary 105 (*)    All other components within normal limits  CBC - Abnormal; Notable for the following components:   RBC 3.37 (*)    Hemoglobin 10.5 (*)    HCT 31.0 (*)    All other components within normal  limits  COMPREHENSIVE METABOLIC PANEL - Abnormal; Notable for the following components:   CO2 19 (*)    Glucose, Bld 101 (*)    Creatinine, Ser 1.45 (*)    Calcium 13.3 (*)    GFR, Estimated 55 (*)    All other components within normal limits  SURGICAL PCR SCREEN  SARS CORONAVIRUS 2 (TAT 6-24 HRS)  BLOOD GAS, ARTERIAL  PROTIME-INR  APTT  URINALYSIS, ROUTINE W REFLEX MICROSCOPIC  HEMOGLOBIN A1C  TYPE AND SCREEN  ABO/RH   [facility] Labs on 03/31/22 include: A1c 5.1 %, Cr 1.78, BUN 43, glucose 101, Na 137, K 3.7, Calcium 14.3.   IMAGES: CXR 04/03/22: In process.  PET Scan 03/01/22: IMPRESSION: 1. Interval decreased hypermetabolic activity in the distal esophagus consistent with response to therapy. 2. Previously demonstrated hypermetabolic noncalcified lymph node in the gastrohepatic ligament show slightly decreased metabolic activity. Although not entirely specific given the thoracic nodal findings, this could reflect a small nodal metastasis based on location. 3. No other findings suspicious for metastatic disease. No evidence of disease progression. 4. Numerous hypermetabolic and partially calcified mediastinal and hilar lymph nodes are again noted, similar to previous study and partially imaged on remote abdominal CT. These are likely related to chronic granulomatous disease (sarcoidosis or infectious).   EKG: 03/31/22: Sinus rhythm with 1st degree A-V block Right bundle branch block Abnormal ECG When compared with ECG of 14-Dec-2021 07:07, No significant change since last tracing Confirmed by Aletta Edouard [phone]) on 03/31/2022 4:44:07 PM   CV: Nuclear stress test 03/16/22:   The study is normal. The study is low risk.   No ST deviation was noted.   LV perfusion is normal. There is no evidence of ischemia. There is no evidence of infarction.   Left ventricular function is abnormal. Global function is mildly reduced. Nuclear stress EF: 47 %. The left ventricular ejection  fraction is mildly decreased (45-54%). End diastolic cavity size is normal. End systolic cavity size is normal.   Prior study not available for comparison.   Fixed perfusion defect at apex with normal wall motion, consistent with artifact Mild systolic dysfunction (EF 27%).  However, he was having PACs/PVCs which could have affected gating for determining EF Low risk study.  Given mild systolic dysfunction, recommend echocardiogram for further evaluation  Past Medical History:  Diagnosis Date   Anxiety    Cancer ([place])    Esophageal Cancer   COVID 2022   mild case   Dyspnea    r/t chemo and radiation   Headache    History of kidney stones    Hypertension    Kidney infection    at age 60   Pneumonia    60 years old   Pre-diabetes    PTSD (post-traumatic stress disorder)    per pt, if woken up suddenly he "cocks back arm" as if to punch but usually wakes up enough to come to before he hits anyone    Past Surgical History:  Procedure Laterality Date   BIOPSY  11/13/2021   Procedure: BIOPSY;  Surgeon: Jerene Bears, MD;  Location: [place];  Service: Gastroenterology;;   BIOPSY  12/14/2021   Procedure: BIOPSY;  Surgeon: Irving Copas., MD;  Location: [place];  Service: Gastroenterology;;   BRONCHIAL BRUSHINGS  12/14/2021   Procedure: BRONCHIAL BRUSHINGS;  Surgeon: Collene Gobble, MD;  Location: [place];  Service: Cardiopulmonary;;   BRONCHIAL NEEDLE ASPIRATION BIOPSY  12/14/2021   Procedure: BRONCHIAL NEEDLE ASPIRATION BIOPSIES;  Surgeon: Collene Gobble, MD;  Location: [place] ENDOSCOPY;  Service: Cardiopulmonary;;   COLONOSCOPY  2018   CYST EXCISION  2022   left side of head   ESOPHAGOGASTRODUODENOSCOPY N/A 11/13/2021   Procedure: ESOPHAGOGASTRODUODENOSCOPY (EGD);  Surgeon: Jerene Bears, MD;  Location: [facility] ENDOSCOPY;  Service: Gastroenterology;  Laterality: N/A;   ESOPHAGOGASTRODUODENOSCOPY (EGD) WITH PROPOFOL N/A 12/14/2021   Procedure:  ESOPHAGOGASTRODUODENOSCOPY (EGD) WITH PROPOFOL;  Surgeon: Rush Landmark Telford Nab., MD;  Location: [place];  Service: Gastroenterology;  Laterality: N/A;   EUS N/A 12/14/2021   Procedure: UPPER ENDOSCOPIC ULTRASOUND (EUS) LINEAR;  Surgeon: Irving Copas., MD;  Location: [place];  Service: Gastroenterology;  Laterality: N/A;   FINE NEEDLE ASPIRATION  12/14/2021   Procedure: FINE NEEDLE ASPIRATION (FNA) LINEAR;  Surgeon: Irving Copas., MD;  Location: [place];  Service: Gastroenterology;;   FOREIGN BODY REMOVAL  11/13/2021   Procedure: FOREIGN BODY REMOVAL;  Surgeon: Jerene Bears, MD;  Location: [place];  Service: Gastroenterology;;   HUMERUS FRACTURE SURGERY Right    IR IMAGING GUIDED PORT INSERTION  12/23/2021   MULTIPLE TOOTH EXTRACTIONS  2015   TONSILLECTOMY AND ADENOIDECTOMY     as a child   VIDEO BRONCHOSCOPY WITH ENDOBRONCHIAL ULTRASOUND Bilateral 12/14/2021   Procedure: VIDEO BRONCHOSCOPY WITH ENDOBRONCHIAL ULTRASOUND;  Surgeon: Collene Gobble, MD;  Location: [place];  Service: Cardiopulmonary;  Laterality: Bilateral;    MEDICATIONS:  Alogliptin Benzoate 25 MG TABS   amLODipine (NORVASC) 10 MG tablet   Ascorbic Acid (VITAMIN C) 1000 MG tablet   Cholecalciferol (VITAMIN D3) 50 MCG (2000 UT) capsule   CINNAMON PO   Cyanocobalamin (B-12 PO)   diphenhydramine-acetaminophen (TYLENOL PM) 25-500 MG TABS tablet   FERROUS SULFATE PO   lidocaine (LIDODERM) 5 %   lidocaine-prilocaine (EMLA) cream   lisinopril (PRINIVIL,ZESTRIL) 40 MG tablet   Melatonin 10 MG TABS   Multiple Vitamin (MULTIVITAMIN WITH MINERALS) TABS tablet   olopatadine (PATANOL) 0.1 % ophthalmic solution   ondansetron (ZOFRAN-ODT) 8 MG disintegrating tablet   pioglitazone (ACTOS) 30 MG tablet   prochlorperazine (COMPAZINE) 10 MG tablet   vitamin E 180 MG (400 UNITS) capsule   No current facility-administered medications for this encounter.    Myra Gianotti,  PA-C Surgical Short Stay/Anesthesiology [facility] Phone [phone] [facility] Phone [phone] 04/04/2022 11:44 AM

## 2022-04-05 ENCOUNTER — Inpatient Hospital Stay (HOSPITAL_COMMUNITY): Payer: No Typology Code available for payment source

## 2022-04-05 ENCOUNTER — Encounter (HOSPITAL_COMMUNITY)
Admission: RE | Disposition: A | Discharge status: Home Health | Payer: Self-pay | Source: Ambulatory Visit | Attending: Thoracic Surgery (Cardiothoracic Vascular Surgery)

## 2022-04-05 ENCOUNTER — Other Ambulatory Visit: Payer: Self-pay

## 2022-04-05 ENCOUNTER — Inpatient Hospital Stay (HOSPITAL_COMMUNITY): Payer: No Typology Code available for payment source | Admitting: Vascular Surgery

## 2022-04-05 ENCOUNTER — Inpatient Hospital Stay (HOSPITAL_COMMUNITY)
Admission: RE | Admit: 2022-04-05 | Discharge: 2022-04-28 | DRG: 988 | Disposition: A | Discharge status: Home Health | Payer: No Typology Code available for payment source | Source: Ambulatory Visit | Attending: Thoracic Surgery (Cardiothoracic Vascular Surgery) | Admitting: Thoracic Surgery (Cardiothoracic Vascular Surgery)

## 2022-04-05 DIAGNOSIS — R062 Wheezing: Secondary | ICD-10-CM | POA: Diagnosis not present

## 2022-04-05 DIAGNOSIS — D709 Neutropenia, unspecified: Secondary | ICD-10-CM | POA: Diagnosis present

## 2022-04-05 DIAGNOSIS — L0291 Cutaneous abscess, unspecified: Secondary | ICD-10-CM | POA: Diagnosis not present

## 2022-04-05 DIAGNOSIS — J9811 Atelectasis: Secondary | ICD-10-CM | POA: Diagnosis not present

## 2022-04-05 DIAGNOSIS — F419 Anxiety disorder, unspecified: Secondary | ICD-10-CM | POA: Diagnosis present

## 2022-04-05 DIAGNOSIS — I898 Other specified noninfective disorders of lymphatic vessels and lymph nodes: Secondary | ICD-10-CM | POA: Diagnosis present

## 2022-04-05 DIAGNOSIS — Z87891 Personal history of nicotine dependence: Secondary | ICD-10-CM

## 2022-04-05 DIAGNOSIS — C154 Malignant neoplasm of middle third of esophagus: Secondary | ICD-10-CM | POA: Diagnosis not present

## 2022-04-05 DIAGNOSIS — I440 Atrioventricular block, first degree: Secondary | ICD-10-CM | POA: Diagnosis present

## 2022-04-05 DIAGNOSIS — K5790 Diverticulosis of intestine, part unspecified, without perforation or abscess without bleeding: Secondary | ICD-10-CM | POA: Diagnosis present

## 2022-04-05 DIAGNOSIS — E1165 Type 2 diabetes mellitus with hyperglycemia: Secondary | ICD-10-CM | POA: Diagnosis not present

## 2022-04-05 DIAGNOSIS — T451X5A Adverse effect of antineoplastic and immunosuppressive drugs, initial encounter: Secondary | ICD-10-CM | POA: Diagnosis present

## 2022-04-05 DIAGNOSIS — K449 Diaphragmatic hernia without obstruction or gangrene: Secondary | ICD-10-CM | POA: Diagnosis present

## 2022-04-05 DIAGNOSIS — G43719 Chronic migraine without aura, intractable, without status migrainosus: Secondary | ICD-10-CM | POA: Diagnosis not present

## 2022-04-05 DIAGNOSIS — Z79899 Other long term (current) drug therapy: Secondary | ICD-10-CM

## 2022-04-05 DIAGNOSIS — L24B1 Irritant contact dermatitis related to digestive stoma or fistula: Secondary | ICD-10-CM | POA: Diagnosis not present

## 2022-04-05 DIAGNOSIS — J90 Pleural effusion, not elsewhere classified: Secondary | ICD-10-CM | POA: Diagnosis not present

## 2022-04-05 DIAGNOSIS — G4730 Sleep apnea, unspecified: Secondary | ICD-10-CM | POA: Diagnosis present

## 2022-04-05 DIAGNOSIS — D71 Functional disorders of polymorphonuclear neutrophils: Secondary | ICD-10-CM | POA: Diagnosis present

## 2022-04-05 DIAGNOSIS — Z800 Family history of malignant neoplasm of digestive organs: Secondary | ICD-10-CM | POA: Diagnosis not present

## 2022-04-05 DIAGNOSIS — I10 Essential (primary) hypertension: Secondary | ICD-10-CM

## 2022-04-05 DIAGNOSIS — Z8051 Family history of malignant neoplasm of kidney: Secondary | ICD-10-CM

## 2022-04-05 DIAGNOSIS — K9413 Enterostomy malfunction: Secondary | ICD-10-CM | POA: Diagnosis not present

## 2022-04-05 DIAGNOSIS — Z884 Allergy status to anesthetic agent status: Secondary | ICD-10-CM

## 2022-04-05 DIAGNOSIS — Z833 Family history of diabetes mellitus: Secondary | ICD-10-CM

## 2022-04-05 DIAGNOSIS — Z923 Personal history of irradiation: Secondary | ICD-10-CM

## 2022-04-05 DIAGNOSIS — Z9221 Personal history of antineoplastic chemotherapy: Secondary | ICD-10-CM

## 2022-04-05 DIAGNOSIS — C159 Malignant neoplasm of esophagus, unspecified: Secondary | ICD-10-CM

## 2022-04-05 DIAGNOSIS — Z8616 Personal history of COVID-19: Secondary | ICD-10-CM

## 2022-04-05 DIAGNOSIS — Z91018 Allergy to other foods: Secondary | ICD-10-CM

## 2022-04-05 DIAGNOSIS — D62 Acute posthemorrhagic anemia: Secondary | ICD-10-CM | POA: Diagnosis not present

## 2022-04-05 DIAGNOSIS — Z8249 Family history of ischemic heart disease and other diseases of the circulatory system: Secondary | ICD-10-CM

## 2022-04-05 DIAGNOSIS — Z7984 Long term (current) use of oral hypoglycemic drugs: Secondary | ICD-10-CM

## 2022-04-05 DIAGNOSIS — R0682 Tachypnea, not elsewhere classified: Secondary | ICD-10-CM | POA: Diagnosis not present

## 2022-04-05 DIAGNOSIS — Z6841 Body Mass Index (BMI) 40.0 and over, adult: Secondary | ICD-10-CM

## 2022-04-05 DIAGNOSIS — E440 Moderate protein-calorie malnutrition: Secondary | ICD-10-CM | POA: Diagnosis present

## 2022-04-05 DIAGNOSIS — Z8052 Family history of malignant neoplasm of bladder: Secondary | ICD-10-CM

## 2022-04-05 DIAGNOSIS — R509 Fever, unspecified: Secondary | ICD-10-CM | POA: Diagnosis not present

## 2022-04-05 DIAGNOSIS — C155 Malignant neoplasm of lower third of esophagus: Principal | ICD-10-CM | POA: Diagnosis present

## 2022-04-05 DIAGNOSIS — Z87442 Personal history of urinary calculi: Secondary | ICD-10-CM

## 2022-04-05 HISTORY — PX: ESOPHAGECTOMY: SUR457

## 2022-04-05 HISTORY — PX: INTERCOSTAL NERVE BLOCK: SHX5021

## 2022-04-05 HISTORY — PX: ESOPHAGOGASTRODUODENOSCOPY: SHX5428

## 2022-04-05 HISTORY — PX: NODE DISSECTION: SHX5269

## 2022-04-05 LAB — GLUCOSE, CAPILLARY
Glucose-Capillary: 94 mg/dL (ref 70–99)
Glucose-Capillary: 159 mg/dL — ABNORMAL HIGH (ref 70–99)

## 2022-04-05 SURGERY — ESOPHAGECTOMY, ROBOT-ASSISTED
Anesthesia: General | Site: Chest

## 2022-04-05 MED ORDER — OLOPATADINE HCL 0.1 % OP SOLN
1.0000 [drp] | Freq: Two times a day (BID) | OPHTHALMIC | Status: DC
  Administered 2022-04-05 – 2022-04-28 (×46): 1 [drp] via OPHTHALMIC
  Filled 2022-04-05 (×3): qty 5

## 2022-04-05 MED ORDER — SUFENTANIL CITRATE 50 MCG/ML IV SOLN
INTRAVENOUS | Status: AC
  Filled 2022-04-05: qty 1

## 2022-04-05 MED ORDER — HEMOSTATIC AGENTS (NO CHARGE) OPTIME
TOPICAL | Status: DC | PRN
  Administered 2022-04-05: 1 via TOPICAL

## 2022-04-05 MED ORDER — DEXAMETHASONE SODIUM PHOSPHATE 10 MG/ML IJ SOLN
INTRAMUSCULAR | Status: AC
  Filled 2022-04-05: qty 1

## 2022-04-05 MED ORDER — PROPOFOL 10 MG/ML IV BOLUS
INTRAVENOUS | Status: AC
  Filled 2022-04-05: qty 20

## 2022-04-05 MED ORDER — ONDANSETRON HCL 4 MG/2ML IJ SOLN
INTRAMUSCULAR | Status: AC
  Filled 2022-04-05: qty 2

## 2022-04-05 MED ORDER — PANTOPRAZOLE SODIUM 40 MG IV SOLR
40.0000 mg | Freq: Two times a day (BID) | INTRAVENOUS | Status: DC
  Administered 2022-04-05 – 2022-04-26 (×42): 40 mg via INTRAVENOUS
  Filled 2022-04-05 (×42): qty 10

## 2022-04-05 MED ORDER — ACETAMINOPHEN 10 MG/ML IV SOLN
1000.0000 mg | Freq: Four times a day (QID) | INTRAVENOUS | Status: AC
  Administered 2022-04-05 – 2022-04-06 (×4): 1000 mg via INTRAVENOUS
  Filled 2022-04-05 (×4): qty 100

## 2022-04-05 MED ORDER — SUGAMMADEX SODIUM 200 MG/2ML IV SOLN
INTRAVENOUS | Status: DC | PRN
  Administered 2022-04-05: 300 mg via INTRAVENOUS

## 2022-04-05 MED ORDER — INSULIN ASPART 100 UNIT/ML IJ SOLN
0.0000 [IU] | INTRAMUSCULAR | Status: DC
  Administered 2022-04-05 – 2022-04-07 (×9): 2 [IU] via SUBCUTANEOUS
  Administered 2022-04-07: 1 [IU] via SUBCUTANEOUS
  Administered 2022-04-08 – 2022-04-10 (×17): 2 [IU] via SUBCUTANEOUS
  Administered 2022-04-10: 4 [IU] via SUBCUTANEOUS
  Administered 2022-04-11 (×2): 2 [IU] via SUBCUTANEOUS
  Administered 2022-04-11 (×2): 4 [IU] via SUBCUTANEOUS
  Administered 2022-04-12: 8 [IU] via SUBCUTANEOUS
  Administered 2022-04-12 (×4): 4 [IU] via SUBCUTANEOUS
  Administered 2022-04-13: 2 [IU] via SUBCUTANEOUS
  Administered 2022-04-13 (×2): 8 [IU] via SUBCUTANEOUS
  Administered 2022-04-13 (×2): 4 [IU] via SUBCUTANEOUS
  Administered 2022-04-14: 2 [IU] via SUBCUTANEOUS
  Administered 2022-04-14 (×2): 4 [IU] via SUBCUTANEOUS
  Administered 2022-04-14 (×2): 2 [IU] via SUBCUTANEOUS
  Administered 2022-04-14: 8 [IU] via SUBCUTANEOUS
  Administered 2022-04-15: 4 [IU] via SUBCUTANEOUS
  Administered 2022-04-15 (×2): 2 [IU] via SUBCUTANEOUS
  Administered 2022-04-15: 8 [IU] via SUBCUTANEOUS
  Administered 2022-04-16 (×2): 4 [IU] via SUBCUTANEOUS
  Administered 2022-04-16: 8 [IU] via SUBCUTANEOUS
  Administered 2022-04-16 (×2): 2 [IU] via SUBCUTANEOUS
  Administered 2022-04-17: 8 [IU] via SUBCUTANEOUS
  Administered 2022-04-17: 16 [IU] via SUBCUTANEOUS
  Administered 2022-04-17: 8 [IU] via SUBCUTANEOUS
  Administered 2022-04-17 (×2): 2 [IU] via SUBCUTANEOUS
  Administered 2022-04-17: 4 [IU] via SUBCUTANEOUS
  Administered 2022-04-18 (×2): 2 [IU] via SUBCUTANEOUS
  Administered 2022-04-18 (×2): 4 [IU] via SUBCUTANEOUS
  Administered 2022-04-18 (×3): 8 [IU] via SUBCUTANEOUS
  Administered 2022-04-19 (×2): 2 [IU] via SUBCUTANEOUS
  Administered 2022-04-19: 8 [IU] via SUBCUTANEOUS
  Administered 2022-04-19: 4 [IU] via SUBCUTANEOUS
  Administered 2022-04-19: 8 [IU] via SUBCUTANEOUS
  Administered 2022-04-19 – 2022-04-20 (×3): 2 [IU] via SUBCUTANEOUS
  Administered 2022-04-20: 4 [IU] via SUBCUTANEOUS
  Administered 2022-04-20: 2 [IU] via SUBCUTANEOUS
  Administered 2022-04-20: 8 [IU] via SUBCUTANEOUS
  Administered 2022-04-21 (×2): 2 [IU] via SUBCUTANEOUS
  Administered 2022-04-21: 8 [IU] via SUBCUTANEOUS
  Administered 2022-04-21: 2 [IU] via SUBCUTANEOUS
  Administered 2022-04-22 (×3): 4 [IU] via SUBCUTANEOUS
  Administered 2022-04-22: 2 [IU] via SUBCUTANEOUS
  Administered 2022-04-23: 4 [IU] via SUBCUTANEOUS
  Administered 2022-04-24 (×3): 2 [IU] via SUBCUTANEOUS
  Administered 2022-04-24: 8 [IU] via SUBCUTANEOUS
  Administered 2022-04-25: 2 [IU] via SUBCUTANEOUS
  Administered 2022-04-25: 4 [IU] via SUBCUTANEOUS
  Administered 2022-04-26: 2 [IU] via SUBCUTANEOUS
  Administered 2022-04-26: 4 [IU] via SUBCUTANEOUS
  Administered 2022-04-26 – 2022-04-27 (×5): 2 [IU] via SUBCUTANEOUS
  Administered 2022-04-27: 4 [IU] via SUBCUTANEOUS
  Administered 2022-04-28: 2 [IU] via SUBCUTANEOUS

## 2022-04-05 MED ORDER — ROCURONIUM BROMIDE 100 MG/10ML IV SOLN: INTRAVENOUS | Status: DC | PRN

## 2022-04-05 MED ORDER — BUPIVACAINE HCL 0.25 % IJ SOLN
INTRAMUSCULAR | Status: DC | PRN
  Administered 2022-04-05: 20 mL

## 2022-04-05 MED ORDER — PHENYLEPHRINE 80 MCG/ML (10ML) SYRINGE FOR IV PUSH (FOR BLOOD PRESSURE SUPPORT)
PREFILLED_SYRINGE | INTRAVENOUS | Status: AC
  Filled 2022-04-05: qty 10

## 2022-04-05 MED ORDER — BUPIVACAINE HCL (PF) 0.25 % IJ SOLN
INTRAMUSCULAR | Status: AC
  Filled 2022-04-05: qty 20

## 2022-04-05 MED ORDER — SUFENTANIL CITRATE 50 MCG/ML IV SOLN
INTRAVENOUS | Status: DC | PRN
  Administered 2022-04-05 (×3): 5 ug via INTRAVENOUS
  Administered 2022-04-05: 15 ug via INTRAVENOUS
  Administered 2022-04-05 (×3): 5 ug via INTRAVENOUS

## 2022-04-05 MED ORDER — FENTANYL CITRATE (PF) 250 MCG/5ML IJ SOLN
INTRAMUSCULAR | Status: AC
  Filled 2022-04-05: qty 5

## 2022-04-05 MED ORDER — DEXMEDETOMIDINE HCL IN NACL 400 MCG/100ML IV SOLN
0.0000 ug/kg/h | INTRAVENOUS | Status: DC
  Filled 2022-04-05: qty 100

## 2022-04-05 MED ORDER — SODIUM CHLORIDE 0.9 % IV SOLN
2.0000 g | Freq: Four times a day (QID) | INTRAVENOUS | Status: AC
  Administered 2022-04-05 – 2022-04-06 (×3): 2 g via INTRAVENOUS
  Filled 2022-04-05 (×3): qty 2

## 2022-04-05 MED ORDER — LACTATED RINGERS IV SOLN: INTRAVENOUS | Status: DC | PRN

## 2022-04-05 MED ORDER — CHLORHEXIDINE GLUCONATE 0.12 % MT SOLN
15.0000 mL | OROMUCOSAL | Status: AC
  Administered 2022-04-05: 15 mL via OROMUCOSAL
  Filled 2022-04-05: qty 15

## 2022-04-05 MED ORDER — ROCURONIUM BROMIDE 10 MG/ML (PF) SYRINGE
PREFILLED_SYRINGE | INTRAVENOUS | Status: AC
  Filled 2022-04-05: qty 10

## 2022-04-05 MED ORDER — EPHEDRINE 5 MG/ML INJ
INTRAVENOUS | Status: AC
  Filled 2022-04-05: qty 5

## 2022-04-05 MED ORDER — CHLORHEXIDINE GLUCONATE 0.12 % MT SOLN
15.0000 mL | Freq: Once | OROMUCOSAL | Status: AC
  Administered 2022-04-05: 15 mL via OROMUCOSAL
  Filled 2022-04-05: qty 15

## 2022-04-05 MED ORDER — INDOCYANINE GREEN 25 MG IV SOLR
INTRAVENOUS | Status: AC
  Filled 2022-04-05: qty 10

## 2022-04-05 MED ORDER — MORPHINE SULFATE (PF) 2 MG/ML IV SOLN
1.0000 mg | INTRAVENOUS | Status: DC | PRN
  Administered 2022-04-05: 2 mg via INTRAVENOUS
  Administered 2022-04-05: 1 mg via INTRAVENOUS
  Administered 2022-04-06: 2 mg via INTRAVENOUS
  Filled 2022-04-05 (×4): qty 1

## 2022-04-05 MED ORDER — ORAL CARE MOUTH RINSE: 15.0000 mL | Freq: Once | OROMUCOSAL | Status: AC

## 2022-04-05 MED ORDER — DEXTROSE 5 % IV SOLN
2.0000 g | Freq: Once | INTRAVENOUS | Status: AC
  Administered 2022-04-05: 2 g via INTRAVENOUS
  Filled 2022-04-05 (×2): qty 2

## 2022-04-05 MED ORDER — PHENYLEPHRINE 80 MCG/ML (10ML) SYRINGE FOR IV PUSH (FOR BLOOD PRESSURE SUPPORT)
PREFILLED_SYRINGE | INTRAVENOUS | Status: DC | PRN
  Administered 2022-04-05 (×2): 160 ug via INTRAVENOUS
  Administered 2022-04-05 (×8): 80 ug via INTRAVENOUS

## 2022-04-05 MED ORDER — SUCCINYLCHOLINE CHLORIDE 200 MG/10ML IV SOSY
PREFILLED_SYRINGE | INTRAVENOUS | Status: AC
  Filled 2022-04-05: qty 10

## 2022-04-05 MED ORDER — LACTATED RINGERS IV SOLN: INTRAVENOUS | Status: DC

## 2022-04-05 MED ORDER — INSULIN ASPART 100 UNIT/ML IJ SOLN: 0.0000 [IU] | INTRAMUSCULAR | Status: DC | PRN

## 2022-04-05 MED ORDER — MIDAZOLAM HCL 2 MG/2ML IJ SOLN
INTRAMUSCULAR | Status: AC
  Filled 2022-04-05: qty 2

## 2022-04-05 MED ORDER — 0.9 % SODIUM CHLORIDE (POUR BTL) OPTIME
TOPICAL | Status: DC | PRN
  Administered 2022-04-05: 2000 mL

## 2022-04-05 MED ORDER — LIDOCAINE 2% (20 MG/ML) 5 ML SYRINGE
INTRAMUSCULAR | Status: AC
  Filled 2022-04-05: qty 5

## 2022-04-05 MED ORDER — DEXTROSE 5 % IV SOLN
2.0000 g | Freq: Once | INTRAVENOUS | Status: DC
  Filled 2022-04-05: qty 2

## 2022-04-05 MED ORDER — PROPOFOL 10 MG/ML IV BOLUS
INTRAVENOUS | Status: DC | PRN
  Administered 2022-04-05: 200 mg via INTRAVENOUS
  Administered 2022-04-05: 50 mg via INTRAVENOUS

## 2022-04-05 MED ORDER — MIDAZOLAM HCL 2 MG/2ML IJ SOLN
INTRAMUSCULAR | Status: DC | PRN
  Administered 2022-04-05: 2 mg via INTRAVENOUS

## 2022-04-05 MED ORDER — ROCURONIUM BROMIDE 10 MG/ML (PF) SYRINGE
PREFILLED_SYRINGE | INTRAVENOUS | Status: DC | PRN
  Administered 2022-04-05 (×4): 20 mg via INTRAVENOUS
  Administered 2022-04-05: 100 mg via INTRAVENOUS
  Administered 2022-04-05 (×3): 20 mg via INTRAVENOUS

## 2022-04-05 MED ORDER — ALBUMIN HUMAN 5 % IV SOLN: INTRAVENOUS | Status: DC | PRN

## 2022-04-05 MED ORDER — ONDANSETRON HCL 4 MG/2ML IJ SOLN
INTRAMUSCULAR | Status: DC | PRN
  Administered 2022-04-05: 4 mg via INTRAVENOUS

## 2022-04-05 MED ORDER — SUGAMMADEX SODIUM 500 MG/5ML IV SOLN
INTRAVENOUS | Status: AC
  Filled 2022-04-05: qty 5

## 2022-04-05 MED ORDER — SODIUM CHLORIDE (PF) 0.9 % IJ SOLN
INTRAMUSCULAR | Status: AC
  Filled 2022-04-05: qty 10

## 2022-04-05 MED ORDER — BUPIVACAINE LIPOSOME 1.3 % IJ SUSP
INTRAMUSCULAR | Status: AC
  Filled 2022-04-05: qty 20

## 2022-04-05 MED ORDER — DEXMEDETOMIDINE HCL IN NACL 80 MCG/20ML IV SOLN
INTRAVENOUS | Status: AC
  Filled 2022-04-05: qty 20

## 2022-04-05 MED ORDER — DEXAMETHASONE SODIUM PHOSPHATE 10 MG/ML IJ SOLN
INTRAMUSCULAR | Status: DC | PRN
  Administered 2022-04-05: 10 mg via INTRAVENOUS

## 2022-04-05 MED ORDER — BUPIVACAINE HCL (PF) 0.5 % IJ SOLN
INTRAMUSCULAR | Status: AC
  Filled 2022-04-05: qty 30

## 2022-04-05 MED ORDER — SODIUM CHLORIDE 0.9 % IV SOLN
2.0000 g | INTRAVENOUS | Status: AC
  Administered 2022-04-05 (×2): 2 g via INTRAVENOUS
  Filled 2022-04-05: qty 2

## 2022-04-05 MED ORDER — ALBUTEROL SULFATE (2.5 MG/3ML) 0.083% IN NEBU: 2.5000 mg | INHALATION_SOLUTION | Freq: Four times a day (QID) | RESPIRATORY_TRACT | Status: AC | PRN

## 2022-04-05 MED ORDER — CHLORHEXIDINE GLUCONATE CLOTH 2 % EX PADS
6.0000 | MEDICATED_PAD | Freq: Every day | CUTANEOUS | Status: DC
  Administered 2022-04-05 – 2022-04-28 (×24): 6 via TOPICAL

## 2022-04-05 MED ORDER — DEXMEDETOMIDINE HCL IN NACL 80 MCG/20ML IV SOLN
INTRAVENOUS | Status: DC | PRN
  Administered 2022-04-05: 4 ug via BUCCAL

## 2022-04-05 MED ORDER — ONDANSETRON HCL 4 MG/2ML IJ SOLN
4.0000 mg | INTRAMUSCULAR | Status: DC | PRN
  Administered 2022-04-20 – 2022-04-27 (×6): 4 mg via INTRAVENOUS
  Filled 2022-04-05 (×6): qty 2

## 2022-04-05 MED ORDER — SODIUM CHLORIDE FLUSH 0.9 % IV SOLN
INTRAVENOUS | Status: DC | PRN
  Administered 2022-04-05: 100 mL

## 2022-04-05 SURGICAL SUPPLY — 147 items
ADH SKN CLS APL DERMABOND .7 (GAUZE/BANDAGES/DRESSINGS) ×2
APL PRP STRL LF DISP 70% ISPRP (MISCELLANEOUS) ×4
BLADE CLIPPER SURG (BLADE) ×3 IMPLANT
BLADE SURG 11 STRL SS (BLADE) ×3 IMPLANT
BUTTON OLYMPUS DEFENDO 5 PIECE (MISCELLANEOUS) ×3 IMPLANT
CANISTER SUCT 3000ML PPV (MISCELLANEOUS) ×6 IMPLANT
CANNULA REDUC XI 12-8 STAPL (CANNULA) ×6
CANNULA REDUCER 12-8 DVNC XI (CANNULA) ×3 IMPLANT
CATH THORACIC 28FR (CATHETERS) IMPLANT
CHLORAPREP W/TINT 26 (MISCELLANEOUS) ×6 IMPLANT
CNTNR URN SCR LID CUP LEK RST (MISCELLANEOUS) ×3 IMPLANT
CONN ST 1/4X3/8  BEN (MISCELLANEOUS) ×3
CONN ST 1/4X3/8 BEN (MISCELLANEOUS) ×3 IMPLANT
CONT SPEC 4OZ STRL OR WHT (MISCELLANEOUS) ×9
COVER TIP SHEARS 8 DVNC (MISCELLANEOUS) IMPLANT
COVER TIP SHEARS 8MM DA VINCI (MISCELLANEOUS) ×3
DEFOGGER SCOPE WARMER CLEARIFY (MISCELLANEOUS) ×3 IMPLANT
DERMABOND ADVANCED .7 DNX12 (GAUZE/BANDAGES/DRESSINGS) ×6 IMPLANT
DEVICE SUTURE ENDOST 10MM (ENDOMECHANICALS) ×3 IMPLANT
DRAIN CHANNEL 19F RND (DRAIN) IMPLANT
DRAIN CHANNEL 28F RND 3/8 FF (WOUND CARE) IMPLANT
DRAIN CONNECTOR BLAKE 1:1 (MISCELLANEOUS) IMPLANT
DRAIN PENROSE .5X12 LATEX STL (DRAIN) IMPLANT
DRAIN PENROSE 1/2X12 LTX STRL (WOUND CARE) IMPLANT
DRAPE ARM DVNC X/XI (DISPOSABLE) ×12 IMPLANT
DRAPE COLUMN DVNC XI (DISPOSABLE) ×3 IMPLANT
DRAPE CV SPLIT W-CLR ANES SCRN (DRAPES) ×6 IMPLANT
DRAPE DA VINCI XI ARM (DISPOSABLE) ×12
DRAPE DA VINCI XI COLUMN (DISPOSABLE) ×3
DRAPE HALF SHEET 40X57 (DRAPES) ×6 IMPLANT
DRAPE INCISE IOBAN 66X45 STRL (DRAPES) ×6 IMPLANT
DRAPE ORTHO SPLIT 77X108 STRL (DRAPES) ×9
DRAPE SURG ORHT 6 SPLT 77X108 (DRAPES) ×6 IMPLANT
ELECT REM PT RETURN 9FT ADLT (ELECTROSURGICAL) ×3
ELECTRODE REM PT RTRN 9FT ADLT (ELECTROSURGICAL) ×3 IMPLANT
EVACUATOR SILICONE 100CC (DRAIN) IMPLANT
FELT TEFLON 1X6 (MISCELLANEOUS) IMPLANT
GAUZE 4X4 16PLY ~~LOC~~+RFID DBL (SPONGE) ×3 IMPLANT
GAUZE KITTNER 4X5 RF (MISCELLANEOUS) ×6 IMPLANT
GAUZE SPONGE 4X4 12PLY STRL (GAUZE/BANDAGES/DRESSINGS) ×3 IMPLANT
GLOVE BIO SURGEON STRL SZ 6.5 (GLOVE) IMPLANT
GLOVE BIO SURGEON STRL SZ7 (GLOVE) ×3 IMPLANT
GLOVE BIO SURGEON STRL SZ7.5 (GLOVE) ×12 IMPLANT
GLOVE BIOGEL PI IND STRL 6.5 (GLOVE) IMPLANT
GOWN STRL REUS W/ TWL LRG LVL3 (GOWN DISPOSABLE) ×6 IMPLANT
GOWN STRL REUS W/ TWL XL LVL3 (GOWN DISPOSABLE) ×12 IMPLANT
GOWN STRL REUS W/TWL LRG LVL3 (GOWN DISPOSABLE) ×6
GOWN STRL REUS W/TWL XL LVL3 (GOWN DISPOSABLE) ×12
GRASPER ENDOPATH ANVIL 10MM (MISCELLANEOUS) IMPLANT
GRASPER SUT TROCAR 14GX15 (MISCELLANEOUS) ×3 IMPLANT
HEMOSTAT SURGICEL 2X14 (HEMOSTASIS) ×3 IMPLANT
IRRIGATION STRYKERFLOW (MISCELLANEOUS) ×3 IMPLANT
IRRIGATOR STRYKERFLOW (MISCELLANEOUS) ×3
IRRIGATOR SUCT 8 DISP DVNC XI (IRRIGATION / IRRIGATOR) IMPLANT
IRRIGATOR SUCTION 8MM XI DISP (IRRIGATION / IRRIGATOR) ×3
IV NS 1000ML (IV SOLUTION)
IV NS 1000ML BAXH (IV SOLUTION) IMPLANT
KIT BASIN OR (CUSTOM PROCEDURE TRAY) ×3 IMPLANT
KIT DILATOR VASC 18G NDL (KITS) ×3 IMPLANT
KIT TUBE JEJUNAL 16FR (CATHETERS) IMPLANT
KIT TURNOVER KIT B (KITS) ×3 IMPLANT
MARKER SKIN DUAL TIP RULER LAB (MISCELLANEOUS) ×3 IMPLANT
NS IRRIG 1000ML POUR BTL (IV SOLUTION) ×6 IMPLANT
OBTURATOR OPTICAL STANDARD 8MM (TROCAR) ×3
OBTURATOR OPTICAL STND 8 DVNC (TROCAR) ×3
OBTURATOR OPTICALSTD 8 DVNC (TROCAR) ×3 IMPLANT
OIL SILICONE PENTAX (PARTS (SERVICE/REPAIRS)) IMPLANT
PACK CHEST (CUSTOM PROCEDURE TRAY) ×3 IMPLANT
PACK LAPAROSCOPY I 1258 (SET/KITS/TRAYS/PACK) ×3 IMPLANT
PAD ARMBOARD 7.5X6 YLW CONV (MISCELLANEOUS) ×6 IMPLANT
PORT ACCESS TROCAR AIRSEAL 12 (TROCAR) IMPLANT
PORT ACCESS TROCAR AIRSEAL 5M (TROCAR) ×3
RELOAD ENDO STITCH (ENDOMECHANICALS) IMPLANT
RELOAD STAPLE 45 2.5 WHT DVNC (STAPLE) IMPLANT
RELOAD STAPLE 45 4.3 GRN DVNC (STAPLE) IMPLANT
RELOAD STAPLER 2.5X45 WHT DVNC (STAPLE) ×6 IMPLANT
RELOAD STAPLER 4.3X45 GRN DVNC (STAPLE) ×39 IMPLANT
RELOAD SUT TRIPLE-STITCH 2-0 (ENDOMECHANICALS) ×24 IMPLANT
RETRACTOR WOUND ALXS 19CM XSML (INSTRUMENTS) ×3 IMPLANT
RTRCTR WOUND ALEXIS 19CM XSML (INSTRUMENTS)
SCISSORS ENDO CVD 5DCS (MISCELLANEOUS) ×3 IMPLANT
SCISSORS LAP 5X35 DISP (ENDOMECHANICALS) IMPLANT
SEAL CANN UNIV 5-8 DVNC XI (MISCELLANEOUS) ×12 IMPLANT
SEAL XI 5MM-8MM UNIVERSAL (MISCELLANEOUS) ×12
SEALER SYNCHRO 8 IS4000 DV (MISCELLANEOUS) ×3
SEALER SYNCHRO 8 IS4000 DVNC (MISCELLANEOUS) ×3 IMPLANT
SET GASTRO INIT PLACEMENT MED (SET/KITS/TRAYS/PACK) ×3 IMPLANT
SET TRI-LUMEN FLTR TB AIRSEAL (TUBING) ×3 IMPLANT
SET TUBE SMOKE EVAC HIGH FLOW (TUBING) ×3 IMPLANT
SHEET MEDIUM DRAPE 40X70 STRL (DRAPES) ×3 IMPLANT
SLEEVE ENDOPATH XCEL 5M (ENDOMECHANICALS) ×3 IMPLANT
SPONGE T-LAP 18X18 ~~LOC~~+RFID (SPONGE) ×12 IMPLANT
STAPLER 45 DA VINCI SURE FORM (STAPLE) ×3
STAPLER 45 SUREFORM CVD (STAPLE) ×3
STAPLER 45 SUREFORM CVD DVNC (STAPLE) IMPLANT
STAPLER 45 SUREFORM DVNC (STAPLE) IMPLANT
STAPLER CANNULA SEAL DVNC XI (STAPLE) ×3 IMPLANT
STAPLER CANNULA SEAL XI (STAPLE) ×6
STAPLER CIRC 25MM 4.8MM THK (STAPLE) IMPLANT
STAPLER RELOAD 2.5X45 WHITE (STAPLE) ×6
STAPLER RELOAD 2.5X45 WHT DVNC (STAPLE) ×6
STAPLER RELOAD 4.3X45 GREEN (STAPLE) ×39
STAPLER RELOAD 4.3X45 GRN DVNC (STAPLE) ×39
STAPLER TRANS-ORAL 25MM EEA (STAPLE) IMPLANT
STOPCOCK 4 WAY LG BORE MALE ST (IV SETS) ×3 IMPLANT
SUT ETHIBOND 0 36 GRN (SUTURE) IMPLANT
SUT MNCRL AB 4-0 PS2 18 (SUTURE) ×6 IMPLANT
SUT SILK  1 MH (SUTURE) ×6
SUT SILK 0 TIES 10X30 (SUTURE) IMPLANT
SUT SILK 1 MH (SUTURE) ×3 IMPLANT
SUT SILK 2 0 SH (SUTURE) ×3 IMPLANT
SUT SURGIDAC NAB ES-9 0 48 120 (SUTURE) ×15 IMPLANT
SUT VIC AB 2-0 CT1 27 (SUTURE) ×3
SUT VIC AB 2-0 CT1 36 (SUTURE) IMPLANT
SUT VIC AB 2-0 CT1 TAPERPNT 27 (SUTURE) IMPLANT
SUT VIC AB 2-0 SH 27 (SUTURE) ×3
SUT VIC AB 2-0 SH 27XBRD (SUTURE) ×3 IMPLANT
SUT VIC AB 3-0 SH 27 (SUTURE) ×18
SUT VIC AB 3-0 SH 27X BRD (SUTURE) ×12 IMPLANT
SUT VIC AB 3-0 SH 8-18 (SUTURE) ×3 IMPLANT
SUT VIC AB 3-0 X1 27 (SUTURE) IMPLANT
SUT VICRYL 0 TIES 12 18 (SUTURE) IMPLANT
SUT VICRYL 0 UR6 27IN ABS (SUTURE) ×12 IMPLANT
SUT VLOC 180 0 9IN  GS21 (SUTURE) ×3
SUT VLOC 180 0 9IN GS21 (SUTURE) ×3 IMPLANT
SUT VLOC 180 2-0 6IN GS21 (SUTURE) IMPLANT
SYR 10ML LL (SYRINGE) ×3 IMPLANT
SYR 20ML ECCENTRIC (SYRINGE) ×3 IMPLANT
SYR 20ML LL LF (SYRINGE) ×3 IMPLANT
SYR 50ML LL SCALE MARK (SYRINGE) ×3 IMPLANT
SYSTEM SAHARA CHEST DRAIN ATS (WOUND CARE) ×3 IMPLANT
TAPE CLOTH SURG 4X10 WHT LF (GAUZE/BANDAGES/DRESSINGS) IMPLANT
TOWEL GREEN STERILE (TOWEL DISPOSABLE) ×6 IMPLANT
TOWEL GREEN STERILE FF (TOWEL DISPOSABLE) ×6 IMPLANT
TRAY FOLEY MTR SLVR 16FR STAT (SET/KITS/TRAYS/PACK) ×3 IMPLANT
TROCAR XCEL 12X100 BLDLESS (ENDOMECHANICALS) ×3 IMPLANT
TROCAR XCEL BLADELESS 5X75MML (TROCAR) ×3 IMPLANT
TROCAR XCEL NON-BLD 5MMX100MML (ENDOMECHANICALS) ×3 IMPLANT
TUBE CONNECTING 20X1/4 (TUBING) ×3 IMPLANT
TUBE J 18FR (TUBING) IMPLANT
TUBE MIC GASTROSTOMY 16FR (TUBING) IMPLANT
TUBING ENDO SMARTCAP (MISCELLANEOUS) ×3 IMPLANT
TUBING EXTENTION W/L.L. (IV SETS) ×3 IMPLANT
TUBING LAP HI FLOW INSUFFLATIO (TUBING) ×3 IMPLANT
UNDERPAD 30X36 HEAVY ABSORB (UNDERPADS AND DIAPERS) ×3 IMPLANT
WATER STERILE IRR 1000ML POUR (IV SOLUTION) ×6 IMPLANT
WIRE EMERALD 3MM-J .035X150CM (WIRE) ×3 IMPLANT

## 2022-04-05 NOTE — Brief Op Note (Signed)
04/05/2022  4:03 PM  PATIENT:  Keith Moreno  61 y.o. male  PRE-OPERATIVE DIAGNOSIS:  ESOPHAGEAL CANCER  POST-OPERATIVE DIAGNOSIS:  ESOPHAGEAL CANCER  PROCEDURE:  Procedure(s): XI ROBOTIC ASSISTED IVOR LEWIS ESOPHAGECTOMY (N/A) XI ROBOTIC ASSISTED JEJUNOSTOMY TUBE PLACEMENT (N/A) ESOPHAGOGASTRODUODENOSCOPY (EGD) (N/A)  SURGEON:  Surgeon(s) and Role:    * Lightfoot, Lucile Crater, MD - Primary  PHYSICIAN ASSISTANT: Kamie Korber PA-C  ANESTHESIA:   general  EBL:  150 ML   BLOOD ADMINISTERED:none  DRAINS: 28 F CHEST TUBE IN HEMITHORAX  LOCAL MEDICATIONS USED:  MARCAINE    and OTHER EXPAREL  SPECIMEN:  Source of Specimen:  ESOPHAGOGASTECTOMY, LN SAMPLE   DISPOSITION OF SPECIMEN:  PATHOLOGY  COUNTS:  YES  TOURNIQUET:  * No tourniquets in log *  DICTATION: .Dragon Dictation  PLAN OF CARE: Admit to inpatient   PATIENT DISPOSITION:  PACU - hemodynamically stable.   Delay start of Pharmacological VTE agent (>24hrs) due to surgical blood loss or risk of bleeding: yes  COMPLICATIONS: NO KNOWN

## 2022-04-05 NOTE — Op Note (Signed)
[address]       [place] [address]             [phone]        04/05/2022  Patient:  Keith Moreno Pre-Op Dx: Stage II b esophageal adenocarcinoma Morbid Obesity DM HTN    Post-op Dx:  same Procedure: - Esophagoscopy - Robotic assisted laparoscopy - Robotic assisted thoracoscopy - Ivor-Lewis esophagectomy - Pyloroplasty - Laparoscopic jejunostomy tube placement 59F - Intercostal nerve block   Surgeon and Role:      * [place] Conaway, Lucile Crater, MD - Primary  Assistant: Evonnie Pat, PA-C  An experienced assistant was required given the complexity of this surgery and the standard of surgical care. The assistant was needed for exposure, dissection, suctioning, retraction of delicate tissues and sutures, instrument exchange and for overall help during this procedure.   Anesthesia  general EBL:  232m Blood Administration: none Specimen:  level 7 lymph node, esophagogastrectomy   Counts: correct   Indications: 61year old male with esophageal adenocarcinoma stage IIb status post neoadjuvant chemoradiation.  He did not complete all of his chemotherapy due to neutropenia however he did complete his radiation on 02/02/2022.  Post radiation PET/CT shows reduction in the avidity and the distal esophageal cancer indicating a good response.  He does still have some lymphadenopathy and his gastrohepatic ligament.  We discussed the risks of robotic assisted Ivor Lewis esophagectomy.  Of concern is his neutropenia and incomplete neoadjuvant chemotherapy course, his morbid obesity, and the calcified lymph nodes in his hilum.  He and his wife remain resolute in their decision to proceed with surgery.    Findings: Good response to therapy.  Hiatal hernia.  Enlarged, calcified mediastinal nodes.  Operative Technique: After the risks, benefits and alternatives were thoroughly discussed, the patient was brought to the operative theatre.  Anesthesia was induced, and the  esophagoscope was passed through the oropharynx down to the stomach.  The scope was retroflexed.  On retrograde examination of the esophagus, there was good response to neoadjuvant therapy.  The scope was then parked at 25 cm from the incisors.  The patient was then prepped and draped in normal sterile fashion.  An appropriate surgical pause was performed, and pre-operative antibiotics were dosed accordingly.  We began with a 1 cm incision 15 cm caudad from the xiphoid and slightly lateral to the umbilicus.  Using an Optiview we entered the peritoneal space.  The abdomen was then insufflated with CO2.  3 other robotic ports were placed to triangulate the hiatus.  Another 12 mm port was placed in place at the level of the umbilicus laterally for an assistant port and another 5 mm trocar was placed in the right lower quadrant for liver retractor.  The patient was then placed in steep reverse Trendelenburg and the liver was elevated to expose the esophageal hiatus.  And then the robot was docked.  We began by dividing the gastrohepatic ligament to expose the right diaphragmatic crus and then dissected the esophagus into the mediastinum.  We then divided the short gastrics and moved towards the right crus and completed our dissection along the esophageal hiatus.  A Penrose drain was then used to encircle the the esophagus and we continued our dissection up into the mediastinum.  The stomach was then retracted superiorly, and we mobilized it off of the pancreas.  The left gastric artery was then isolated and divided with a robotic stabler.  ICG was then injected through  his central line, and good blood flow to the stomach was evident.  We then marked an area away from the right gastroepiploic artery, and began to divide the omentum.    Once we achieved good mobilization, we focused our attention on the pylorus.  A 3cm longitudinal incision.  This was then closed transversely using 2-0 V lock sutures.  We then began  to tubularized the gastric conduit with several fires of the robotic stapler.  Once complete, the conduit was then attached to the distal end of the specimen.  We then undocked the robot, after removing all instruments, and the liver retractor, and then focused our attention placement of the jejunostomy tube.  The ligament of Treitz was identified, and a portion of small bowel about 20-30cm distal was used.  Corner stiches were placed, and passed out through the abdominal wall.  Using Seldinger technique, we access the small bowel and confirmed its position with insufflation.  The tract was sequentially dilated, and we passed a 74F jejunostomy tube distally.  The sutures were secured, and another proximal stitch was placed to prevent torsion.  The pneumoperitoneum was released, and all ports were removed.  The incisions were closed with absorbable suture.  The esophagoscope was removed.  The patient was then placed in a left lateral decubitus position.  The robotic ports were placed to triangulate the esophagus.  We continued our mobilization of the esophagus up to the azygous vein.  The azygous vein was divided with a robotic stapler.  The esophagus was then mobilized circumferentially, and then divided at the level of the azygous vein.  We then mobilized the esophagus along with the gastric conduit into the chest.  We ensured that the staple line in the conduit was oriented appropriately.  A small gastrotomy was then made and using the robotic stapler the back row the anastomosis between the gastric conduit and the proximal esophagus was then created.  Then using 2-0 V-Loc sutures the front row of the anastomosis was closed in 2 layers.  A vascularized omental fat pad was then used to buttress the anastomosis.  The ports were removed, and a 57F chest tube was placed.  An intercostal nerve block was performed.  The lung was expanded.  The incisions were closed with absorbable suture.    The patient was then  placed back in a supine position and the gastroscope was then inserted through the oropharynx and passed down.  The anastomosis appeared intact.  We then passed the gastroscope down under direct visualization into the gastric conduit by the pylorus.  The patient tolerated the procedure without any immediate complications, and was transferred to the PACU in stable condition.  Salam Micucci Bary Leriche

## 2022-04-05 NOTE — Anesthesia Postprocedure Evaluation (Signed)
Anesthesia Post Note  Patient: Keith Moreno  Procedure(s) Performed: XI ROBOTIC ASSISTED IVOR LEWIS ESOPHAGECTOMY (Chest) XI ROBOTIC ASSISTED JEJUNOSTOMY TUBE PLACEMENT (Chest) ESOPHAGOGASTRODUODENOSCOPY (EGD) NODE DISSECTION (Chest) INTERCOSTAL NERVE BLOCK (Chest)     Patient location during evaluation: PACU Anesthesia Type: General Level of consciousness: awake and alert Pain management: pain level controlled Vital Signs Assessment: post-procedure vital signs reviewed and stable Respiratory status: spontaneous breathing, nonlabored ventilation, respiratory function stable and patient connected to nasal cannula oxygen Cardiovascular status: blood pressure returned to baseline and stable Postop Assessment: no apparent nausea or vomiting Anesthetic complications: no   No notable events documented.  Last Vitals:  Vitals:   04/05/22 1715 04/05/22 1730  BP: (!) 160/86 (!) 164/89  Pulse: 86 85  Resp: (!) 29 (!) 28  Temp:    SpO2: 95% 95%    Last Pain:  Vitals:   04/05/22 1700  TempSrc:   PainSc: [place] Ronte Parker

## 2022-04-05 NOTE — H&P (Signed)
[address]       [place] [address]             [id number]                                                   Kweli Morefield [name] Medical Record #[id number] Date of Birth: [birth year]   Referring: [facility] Care: [facility] Primary Cardiologist: None   Chief Complaint:        Chief Complaint  Patient presents with   Esophageal Cancer      F/u after PET Scan 03/01/22       History of Present Illness:    Keith Moreno 61 y.o. male presents today for his follow-up appointment.  He completed his radiation therapy February 02, 2022.  He had to stop his chemotherapy early due to neutropenia.  His diet has fluctuated about 10 pounds throughout the course of his therapy.  His biggest issue has been labile blood pressures.         Zubrod Score: At the time of surgery this patient's most appropriate activity status/level should be described as: '[x]'$     0    Normal activity, no symptoms '[]'$     1    Restricted in physical strenuous activity but ambulatory, able to do out light work '[]'$     2    Ambulatory and capable of self care, unable to do work activities, up and about               >50 % of waking hours                              '[]'$     3    Only limited self care, in bed greater than 50% of waking hours '[]'$     4    Completely disabled, no self care, confined to bed or chair '[]'$     5    Moribund         Past Medical History:  Diagnosis Date   Anxiety     COVID 2022    mild case   History of kidney stones     Hypertension     Kidney infection      at age 71   Pneumonia      9 years ago   Pre-diabetes     PTSD (post-traumatic stress disorder)             Past Surgical History:  Procedure Laterality Date   BIOPSY   11/13/2021    Procedure: BIOPSY;  Surgeon: Jerene Bears, MD;  Location: [place];  Service: Gastroenterology;;   BIOPSY   12/14/2021    Procedure: BIOPSY;  Surgeon: Irving Copas., MD;   Location: [facility] ENDOSCOPY;  Service: Gastroenterology;;   BRONCHIAL BRUSHINGS   12/14/2021    Procedure: BRONCHIAL BRUSHINGS;  Surgeon: Collene Gobble, MD;  Location: [place];  Service: Cardiopulmonary;;   BRONCHIAL NEEDLE ASPIRATION BIOPSY   12/14/2021    Procedure: BRONCHIAL NEEDLE ASPIRATION BIOPSIES;  Surgeon: Collene Gobble, MD;  Location: [facility] ENDOSCOPY;  Service: Cardiopulmonary;;   ESOPHAGOGASTRODUODENOSCOPY N/A 11/13/2021    Procedure: ESOPHAGOGASTRODUODENOSCOPY (EGD);  Surgeon: Jerene Bears, MD;  Location: [facility] ENDOSCOPY;  Service:  Gastroenterology;  Laterality: N/A;   ESOPHAGOGASTRODUODENOSCOPY (EGD) WITH PROPOFOL N/A 12/14/2021    Procedure: ESOPHAGOGASTRODUODENOSCOPY (EGD) WITH PROPOFOL;  Surgeon: Rush Landmark Telford Nab., MD;  Location: [place];  Service: Gastroenterology;  Laterality: N/A;   EUS N/A 12/14/2021    Procedure: UPPER ENDOSCOPIC ULTRASOUND (EUS) LINEAR;  Surgeon: Irving Copas., MD;  Location: [place];  Service: Gastroenterology;  Laterality: N/A;   FINE NEEDLE ASPIRATION   12/14/2021    Procedure: FINE NEEDLE ASPIRATION (FNA) LINEAR;  Surgeon: Irving Copas., MD;  Location: [place];  Service: Gastroenterology;;   FOREIGN BODY REMOVAL   11/13/2021    Procedure: FOREIGN BODY REMOVAL;  Surgeon: Jerene Bears, MD;  Location: [place];  Service: Gastroenterology;;   HUMERUS FRACTURE SURGERY Right     IR IMAGING GUIDED PORT INSERTION   12/23/2021   TONSILLECTOMY AND ADENOIDECTOMY       VIDEO BRONCHOSCOPY WITH ENDOBRONCHIAL ULTRASOUND Bilateral 12/14/2021    Procedure: VIDEO BRONCHOSCOPY WITH ENDOBRONCHIAL ULTRASOUND;  Surgeon: Collene Gobble, MD;  Location: [place];  Service: Cardiopulmonary;  Laterality: Bilateral;           Family History  Problem Relation Age of Onset   Hypertension Mother     Diabetes Mother     Bipolar disorder Mother     Hypertension Father     Kidney cancer Sister     Colon cancer Paternal Uncle     Cancer  Cousin     Bladder Cancer Cousin     Cancer Cousin          Social History        Tobacco Use  Smoking Status Former   Types: Cigars, Cigarettes, Pipe   Quit date: 1983   Years since quitting: 45.6  Smokeless Tobacco Former   Types: Chew   Quit date: 1983    Social History        Substance and Sexual Activity  Alcohol Use Yes    Comment: Occasional             Allergies  Allergen Reactions   Procaine        Other reaction(s): Other, Respiratory Distress   Chocolate        Hyperactivity, respiratory distress   Other        Novocaine- respiratory distress            Current Outpatient Medications  Medication Sig Dispense Refill   Alogliptin Benzoate 25 MG TABS Take 12.5 mg by mouth at bedtime.       amLODipine (NORVASC) 10 MG tablet Take 1 tablet by mouth at bedtime.       Ascorbic Acid (VITAMIN C) 1000 MG tablet Take 2,000 mg by mouth every morning.       Cholecalciferol (VITAMIN D3) 50 MCG (2000 UT) capsule Take 20,000 Units by mouth at bedtime. 10 gel caps       CINNAMON PO Take 4,000 mg by mouth 2 (two) times daily. 1000 mg each       diphenhydramine-acetaminophen (TYLENOL PM) 25-500 MG TABS tablet Take 2 tablets by mouth at bedtime. 14 tablet     Krill Oil 350 MG CAPS Take 1,050 mg by mouth at bedtime.       lidocaine (LIDODERM) 5 % Place 1 patch onto the skin as needed.       lidocaine-prilocaine (EMLA) cream Apply 1 application. topically as needed. 30 g 0   lisinopril (PRINIVIL,ZESTRIL) 40 MG tablet Take 40 mg by mouth at bedtime.  MAGNESIUM CITRATE PO Take 100 mg by mouth at bedtime.       Melatonin 10 MG TABS Take 40 mg by mouth at bedtime as needed (Sleep).       olopatadine (PATANOL) 0.1 % ophthalmic solution Place 1 drop into both eyes 2 (two) times daily.       ondansetron (ZOFRAN-ODT) 8 MG disintegrating tablet Take 1 tablet (8 mg total) by mouth every 8 (eight) hours as needed for nausea or vomiting. 90 tablet 3   pioglitazone (ACTOS) 30 MG  tablet Take 30 mg by mouth every morning.       Potassium 99 MG TABS Take 198 mg by mouth daily.       prochlorperazine (COMPAZINE) 10 MG tablet Take 1 tablet (10 mg total) by mouth every 6 (six) hours as needed for nausea or vomiting. 90 tablet 3   vitamin E 180 MG (400 UNITS) capsule Take 1,200 Units by mouth 2 (two) times daily. Gel caps       polyethylene glycol (GOLYTELY) 236 g solution Take 4,000 mLs by mouth once for 1 dose. Drink 1/2 bottle, beginning at 10 am, the day before your surgery. 4000 mL 0    No current facility-administered medications for this visit.      Review of Systems  Constitutional:  Positive for malaise/fatigue and weight loss.  Respiratory:  Positive for shortness of breath.   Cardiovascular:  Positive for chest pain.  Gastrointestinal:  Negative for heartburn, nausea and vomiting.        PHYSICAL EXAMINATION: BP 107/70   Pulse 92   Resp 20   Ht 6' (1.829 m)   Wt (!) 330 lb (149.7 kg)   SpO2 97% Comment: RA  BMI 44.76 kg/m  Physical Exam Constitutional:      Appearance: He is obese. He is not ill-appearing.  Cardiovascular:     Rate and Rhythm: Normal rate and regular rhythm.  Pulmonary:     Effort: Pulmonary effort is normal. No respiratory distress.  Musculoskeletal:     Cervical back: Normal range of motion.  Neurological:     Mental Status: He is alert.        Diagnostic Studies & Laboratory data:      PET/CT: 03/01/2022  IMPRESSION: 1. Interval decreased hypermetabolic activity in the distal esophagus consistent with response to therapy. 2. Previously demonstrated hypermetabolic noncalcified lymph node in the gastrohepatic ligament show slightly decreased metabolic activity. Although not entirely specific given the thoracic nodal findings, this could reflect a small nodal metastasis based on location. 3. No other findings suspicious for metastatic disease. No evidence of disease progression. 4. Numerous hypermetabolic and  partially calcified mediastinal and hilar lymph nodes are again noted, similar to previous study and partially imaged on remote abdominal CT. These are likely related to chronic granulomatous disease (sarcoidosis or infectious).         I have independently reviewed the above radiology studies  and reviewed the findings with the patient.    Recent Lab Findings: Recent Labs       Lab Results  Component Value Date    WBC 3.5 (L) 03/01/2022    HGB 11.2 (L) 03/01/2022    HCT 32.7 (L) 03/01/2022    PLT 192 03/01/2022    GLUCOSE 96 03/01/2022    ALT 13 03/01/2022    AST 16 03/01/2022    NA 139 03/01/2022    K 3.8 03/01/2022    CL 105 03/01/2022    CREATININE 0.87 03/01/2022  BUN 17 03/01/2022    CO2 30 03/01/2022          Problem List: Stage IIb distal esophageal adenocarcinoma Mediastinal adenopathy consistent with chronic granulomatous disease. History of neutropenia during neoadjuvant therapy Morbid obesity   Assessment / Plan:   61 year old male with esophageal adenocarcinoma stage IIb status post neoadjuvant chemoradiation.  He did not complete all of his chemotherapy due to neutropenia however he did complete his radiation on 02/02/2022.  Post radiation PET/CT shows reduction in the avidity and the distal esophageal cancer indicating a good response.  He does still have some lymphadenopathy and his gastrohepatic ligament.  We discussed the risks of robotic assisted Ivor Lewis esophagectomy.  Of concern is his neutropenia and incomplete neoadjuvant chemotherapy course, his morbid obesity, and the calcified lymph nodes in his hilum.  He and his wife remain resolute in their decision to proceed with surgery.  He is tentatively scheduled for the middle of September.  He will require a stress test prior to surgery.          I  spent 30 minutes with the patient face to face counseling and coordination of care.

## 2022-04-05 NOTE — Anesthesia Procedure Notes (Signed)
Procedure Name: Intubation Date/Time: 04/05/2022 1:23 PM  Performed by: Anastasio Auerbach, CRNAPre-anesthesia Checklist: Patient identified, Emergency Drugs available, Suction available and Patient being monitored Patient Re-evaluated:Patient Re-evaluated prior to induction Oxygen Delivery Method: Circle system utilized Preoxygenation: Pre-oxygenation with 100% oxygen Induction Type: Inhalational induction with existing ETT Laryngoscope Size: Mac and 4 Grade View: Grade I Tube type: Oral Endobronchial tube: Left, EBT position confirmed by auscultation, EBT position confirmed by fiberoptic bronchoscope and Double lumen EBT and 39 Fr Number of attempts: 1 Airway Equipment and Method: Stylet Placement Confirmation: ETT inserted through vocal cords under direct vision, positive ETCO2 and breath sounds checked- equal and bilateral Secured at: 29 cm Tube secured with: Tape Dental Injury: Teeth and Oropharynx as per pre-operative assessment  Comments: Performed by Encarnacion Chu, SRNA - under direct visualization.

## 2022-04-05 NOTE — Anesthesia Procedure Notes (Cosign Needed Addendum)
Procedure Name: Intubation Date/Time: 04/05/2022 7:57 AM  Performed by: Anastasio Auerbach, CRNAPre-anesthesia Checklist: Patient identified, Emergency Drugs available, Suction available and Patient being monitored Patient Re-evaluated:Patient Re-evaluated prior to induction Oxygen Delivery Method: Circle system utilized Preoxygenation: Pre-oxygenation with 100% oxygen Induction Type: IV induction Ventilation: Mask ventilation without difficulty Laryngoscope Size: Mac and 4 Grade View: Grade I Tube type: Oral Tube size: 8.0 mm Number of attempts: 1 Airway Equipment and Method: Stylet Placement Confirmation: ETT inserted through vocal cords under direct vision, positive ETCO2 and breath sounds checked- equal and bilateral Secured at: 23 cm Tube secured with: Tape Dental Injury: Teeth and Oropharynx as per pre-operative assessment  Comments: Performed by Encarnacion Chu, SRNA.

## 2022-04-05 NOTE — Anesthesia Procedure Notes (Signed)
Central Venous Catheter Insertion Performed by: Murvin Natal, MD, anesthesiologist Start/End9/27/2023 7:00 AM, 04/05/2022 7:15 AM Patient location: Pre-op. Preanesthetic checklist: patient identified, IV checked, site marked, risks and benefits discussed, surgical consent, monitors and equipment checked, pre-op evaluation, timeout performed and anesthesia consent Position: Trendelenburg Lidocaine 1% used for infiltration and patient sedated Hand hygiene performed , maximum sterile barriers used  and Seldinger technique used Catheter size: 8 Fr Total catheter length 16. Central line was placed.Double lumen Procedure performed using ultrasound guided technique. Ultrasound Notes:anatomy identified, needle tip was noted to be adjacent to the nerve/plexus identified, no ultrasound evidence of intravascular and/or intraneural injection and image(s) printed for medical record Attempts: 1 Following insertion, dressing applied, line sutured and Biopatch. Post procedure assessment: blood return through all ports  Patient tolerated the procedure well with no immediate complications.

## 2022-04-05 NOTE — Transfer of Care (Signed)
Immediate Anesthesia Transfer of Care Note  Patient: Keith Moreno  Procedure(s) Performed: XI ROBOTIC ASSISTED IVOR LEWIS ESOPHAGECTOMY (Chest) XI ROBOTIC ASSISTED JEJUNOSTOMY TUBE PLACEMENT (Chest) ESOPHAGOGASTRODUODENOSCOPY (EGD) NODE DISSECTION (Chest) INTERCOSTAL NERVE BLOCK (Chest)  Patient Location: PACU  Anesthesia Type:General  Level of Consciousness: awake, orientated, drowsy  Airway & Oxygen Therapy: Patient Spontanous Breathing and Patient connected to nasal cannula oxygen  Post-op Assessment: Report given to RN and Post -op Vital signs reviewed and stable  Post vital signs: Reviewed and stable  Last Vitals:  Vitals Value Taken Time  BP 122/93 04/05/22 1652  Temp    Pulse 85 04/05/22 1659  Resp 22 04/05/22 1659  SpO2 98 % 04/05/22 1659  Vitals shown include unvalidated device data.  Last Pain:  Vitals:   04/05/22 0621  TempSrc:   PainSc: 6       Patients Stated Pain Goal: 4 (86/75/44 9201)  Complications: No notable events documented.

## 2022-04-06 ENCOUNTER — Other Ambulatory Visit: Payer: Self-pay

## 2022-04-06 ENCOUNTER — Encounter (HOSPITAL_COMMUNITY): Payer: Self-pay | Admitting: Thoracic Surgery (Cardiothoracic Vascular Surgery)

## 2022-04-06 ENCOUNTER — Inpatient Hospital Stay (HOSPITAL_COMMUNITY): Payer: No Typology Code available for payment source

## 2022-04-06 DIAGNOSIS — E440 Moderate protein-calorie malnutrition: Secondary | ICD-10-CM | POA: Insufficient documentation

## 2022-04-06 LAB — BLOOD GAS, ARTERIAL
Acid-base deficit: 0.3 mmol/L (ref 0.0–2.0)
Bicarbonate: 24.1 mmol/L (ref 20.0–28.0)
O2 Saturation: 98 %
Patient temperature: 37
pCO2 arterial: 38 mmHg (ref 32–48)
pH, Arterial: 7.41 (ref 7.35–7.45)
pO2, Arterial: 91 mmHg (ref 83–108)

## 2022-04-06 LAB — BASIC METABOLIC PANEL
Anion gap: 6 (ref 5–15)
BUN: 17 mg/dL (ref 8–23)
CO2: 24 mmol/L (ref 22–32)
Calcium: 11.7 mg/dL — ABNORMAL HIGH (ref 8.9–10.3)
Chloride: 109 mmol/L (ref 98–111)
Creatinine, Ser: 1.63 mg/dL — ABNORMAL HIGH (ref 0.61–1.24)
GFR, Estimated: 48 mL/min — ABNORMAL LOW (ref >60)
Glucose, Bld: 105 mg/dL — ABNORMAL HIGH (ref 70–99)
Potassium: 3.8 mmol/L (ref 3.5–5.1)
Sodium: 139 mmol/L (ref 135–145)

## 2022-04-06 LAB — GLUCOSE, CAPILLARY
Glucose-Capillary: 100 mg/dL — ABNORMAL HIGH (ref 70–99)
Glucose-Capillary: 105 mg/dL — ABNORMAL HIGH (ref 70–99)
Glucose-Capillary: 111 mg/dL — ABNORMAL HIGH (ref 70–99)
Glucose-Capillary: 132 mg/dL — ABNORMAL HIGH (ref 70–99)
Glucose-Capillary: 133 mg/dL — ABNORMAL HIGH (ref 70–99)
Glucose-Capillary: 134 mg/dL — ABNORMAL HIGH (ref 70–99)
Glucose-Capillary: 141 mg/dL — ABNORMAL HIGH (ref 70–99)
Glucose-Capillary: 143 mg/dL — ABNORMAL HIGH (ref 70–99)

## 2022-04-06 LAB — CBC
HCT: 27.2 % — ABNORMAL LOW (ref 39.0–52.0)
Hemoglobin: 9.3 g/dL — ABNORMAL LOW (ref 13.0–17.0)
MCH: 31.7 pg (ref 26.0–34.0)
MCHC: 34.2 g/dL (ref 30.0–36.0)
MCV: 92.8 fL (ref 80.0–100.0)
Platelets: 150 10*3/uL (ref 150–400)
RBC: 2.93 MIL/uL — ABNORMAL LOW (ref 4.22–5.81)
RDW: 14.1 % (ref 11.5–15.5)
WBC: 8.2 10*3/uL (ref 4.0–10.5)
nRBC: 0 % (ref 0.0–0.2)

## 2022-04-06 MED ORDER — PROSOURCE TF20 ENFIT COMPATIBL EN LIQD
60.0000 mL | Freq: Every day | ENTERAL | Status: DC
  Administered 2022-04-06 – 2022-04-12 (×7): 60 mL
  Filled 2022-04-06 (×6): qty 60

## 2022-04-06 MED ORDER — HYDROCODONE-ACETAMINOPHEN 7.5-325 MG/15ML PO SOLN
10.0000 mL | Freq: Four times a day (QID) | ORAL | Status: DC | PRN
  Administered 2022-04-06 – 2022-04-24 (×29): 10 mL
  Filled 2022-04-06 (×29): qty 15

## 2022-04-06 MED ORDER — HYDRALAZINE HCL 20 MG/ML IJ SOLN
10.0000 mg | Freq: Four times a day (QID) | INTRAMUSCULAR | Status: DC | PRN
  Administered 2022-04-06 – 2022-04-10 (×6): 10 mg via INTRAVENOUS
  Filled 2022-04-06 (×6): qty 1

## 2022-04-06 MED ORDER — MORPHINE SULFATE (PF) 2 MG/ML IV SOLN
2.0000 mg | INTRAVENOUS | Status: DC | PRN
  Administered 2022-04-06 – 2022-04-18 (×19): 2 mg via INTRAVENOUS
  Filled 2022-04-06 (×20): qty 1

## 2022-04-06 MED ORDER — LUNG SURGERY BOOK
Freq: Once | Status: AC
  Filled 2022-04-06: qty 1

## 2022-04-06 MED ORDER — OSMOLITE 1.5 CAL PO LIQD
1000.0000 mL | ORAL | Status: DC
  Administered 2022-04-06 – 2022-04-09 (×5): 1000 mL
  Filled 2022-04-06 (×3): qty 1000

## 2022-04-06 MED ORDER — ENOXAPARIN SODIUM 80 MG/0.8ML IJ SOSY
75.0000 mg | PREFILLED_SYRINGE | INTRAMUSCULAR | Status: DC
  Administered 2022-04-06 – 2022-04-27 (×22): 75 mg via SUBCUTANEOUS
  Filled 2022-04-06 (×22): qty 0.8

## 2022-04-06 MED ORDER — FREE WATER
100.0000 mL | Status: DC
  Administered 2022-04-06 – 2022-04-09 (×18): 100 mL

## 2022-04-06 NOTE — Progress Notes (Signed)
ANTICOAGULATION CONSULT NOTE - Initial Consult  Pharmacy Consult for lovenox Indication: VTE prophylaxis  Allergies  Allergen Reactions   Procaine     Other reaction(s): Other, Respiratory Distress   Chocolate     Hyperactivity, respiratory distress   Other     Novocaine- respiratory distress    Patient Measurements: Height: 6' (182.9 cm) Weight: (!) 150.3 kg (331 lb 6.4 oz) IBW/kg (Calculated) : 77.6   Vital Signs: Temp: 98 F (36.7 C) (09/28 1200) Temp Source: Oral (09/28 1200) BP: 160/82 (09/28 1433) Pulse Rate: 87 (09/28 1430)  Labs: Recent Labs    04/06/22 0630  HGB 9.3*  HCT 27.2*  PLT 150  CREATININE 1.63*    Estimated Creatinine Clearance: 71.8 mL/min (A) (by C-G formula based on SCr of 1.63 mg/dL (H)).   Medical History: Past Medical History:  Diagnosis Date   Anxiety    Cancer ([place])    Esophageal Cancer   COVID 2022   mild case   Dyspnea    r/t chemo and radiation   Headache    History of kidney stones    Hypertension    Kidney infection    at age 16   Pneumonia    61 years old   Pre-diabetes    PTSD (post-traumatic stress disorder)    per pt, if woken up suddenly he "cocks back arm" as if to punch but usually wakes up enough to come to before he hits anyone    Medications:  Medications Prior to Admission  Medication Sig Dispense Refill Last Dose   Alogliptin Benzoate 25 MG TABS Take 12.5 mg by mouth at bedtime.   04/04/2022   amLODipine (NORVASC) 10 MG tablet Take 10 mg by mouth at bedtime.   04/04/2022   Ascorbic Acid (VITAMIN C) 1000 MG tablet Take 2,000 mg by mouth every morning.   04/04/2022   Cholecalciferol (VITAMIN D3) 50 MCG (2000 UT) capsule Take 20,000 Units by mouth at bedtime.   04/04/2022   CINNAMON PO Take 4,000 mg by mouth 2 (two) times daily.   04/04/2022   Cyanocobalamin (B-12 PO) Take 1 tablet by mouth daily.   04/04/2022   diphenhydramine-acetaminophen (TYLENOL PM) 25-500 MG TABS tablet Take 2 tablets by mouth at bedtime.  14 tablet  04/04/2022   FERROUS SULFATE PO Take 1 tablet by mouth daily.   04/04/2022   lidocaine-prilocaine (EMLA) cream Apply 1 application. topically as needed. 30 g 0 Past Month   lisinopril (PRINIVIL,ZESTRIL) 40 MG tablet Take 40 mg by mouth at bedtime.   04/04/2022   Melatonin 10 MG TABS Take 40 mg by mouth at bedtime as needed (Sleep).   04/04/2022   Multiple Vitamin (MULTIVITAMIN WITH MINERALS) TABS tablet Take 1 tablet by mouth daily.   04/04/2022   olopatadine (PATANOL) 0.1 % ophthalmic solution Place 1 drop into both eyes 2 (two) times daily.   04/04/2022   ondansetron (ZOFRAN-ODT) 8 MG disintegrating tablet Take 1 tablet (8 mg total) by mouth every 8 (eight) hours as needed for nausea or vomiting. 90 tablet 3 Past Month   pioglitazone (ACTOS) 30 MG tablet Take 30 mg by mouth every morning.   04/04/2022   vitamin E 180 MG (400 UNITS) capsule Take 1,200 Units by mouth 2 (two) times daily.   Past Week   lidocaine (LIDODERM) 5 % Place 1 patch onto the skin daily as needed (pain).   More than a month   prochlorperazine (COMPAZINE) 10 MG tablet Take 1 tablet (10 mg  total) by mouth every 6 (six) hours as needed for nausea or vomiting. 90 tablet 3 More than a month    Assessment: 9 YOM s/p esophagectomy 2/2 esophageal carcinoma requiring VTE prophylaxis starting POD#1 (04/06/2022)  Goal of Therapy:  Monitor platelets by anticoagulation protocol: Yes   Plan:  Lovenox 75 mg [place] q24h. Higher dose utilized 2/2 BMI >30 kg/m2 Pharmacy will sign off of consult but continue to monitor and make recommendations prn  Thank you for the consult  Vaughan Basta BS, PharmD, BCPS Clinical Pharmacist 04/06/2022 3:36 PM  Contact: [phone] after 3 PM  "Be curious, not judgmental..." -Jamal Maes

## 2022-04-06 NOTE — Hospital Course (Addendum)
Referring: [facility] Care: [facility] Cardiologist: None    History of present illness:  The patient is a 61 year old male with stage IIb esophageal adenocarcinoma referred to [name] for surgical resection.  He has completed radiation but was unable to complete chemotherapy due to neutropenia.  Postradiation PET/CT scan shows reduction in the avidity of the distal esophageal cancer indicating a good response.  He does still have some lymphadenopathy in the gastrohepatic ligament.  Following full discussion with the patient he agreed to proceed with Kathreen Cornfield robotic assisted esophagectomy.  He was admitted electively for the procedure.  Hospital course:  Patient was admitted electively on 04/05/2022 and taken the operating room at which time he underwent robotic assisted Ivor Lewis esophagectomy as well as placement of jejunostomy tube and the EGD.  He tolerated the procedure well was taken to the surgical recovery room in stable condition.  Postoperative hospital course:  Patient is doing well.  He was extubated without difficulty using standard protocols.  His vitals have remained stable and he had only 1 day where he experienced fever but has subsequently defervesced.  He does have an expected acute blood loss anemia which is moderate and slowly decreasing over time.  His preoperative creatinine was 1.64 and is improved postoperatively.  Most recent value on 10/ 13/2023 was 0.86.   He was started on a J-tube feeding regimen with gradual increase to goal rate.  The nutritionists were assisting with tube feeding management.  A swallowing study done on postoperative day #5 showed no leak and dysphagia 1 diet was initiated.  He was also started on cycling of his tube feeds to nighttime.  His chest tube has been discontinued and plan is to keep his bulb drain in place to be removed at a later time.   Physical and Occupational Therapy have assisted with  postoperative recovery and plans are being made for home therapies to be arranged.  Oxygen has been weaned and he maintains good saturations on room air.  He does occasionally desaturate at night and would most likely benefit from a sleep study in the future for potential sleep apnea.  Incisions are noted to be healing well without evidence of infection, however there is some drainage around the J-tube site.  This is foul smelling and wound culture was obtained.  The patient became febrile with mild tachycardia and tachypnea.  Blood cultures were also obtained.  Wound cultures have subsequently grown abundant Klebsiella pneumoniae, abundant Proteus mirabilis, and rare PEDIOCOCCUS PENTOSACEUS .  He was initially placed on Zosyn.  A CT scan of the chest abdomen and pelvis was  obtained and results showed air and fluid collection anterior to the gastric body, pylorus and duodenal bulb.  It was Dr. Abran Duke opinion that this finding is in the region of the pyloroplasty and not consistent with abscess or perforation.  Additionally there was a moderate size left-sided pleural effusion and a thoracentesis has been obtained.  This yielded 1 L of serosanguineous fluid.  On postop day #15 he was transitioned off of Zosyn to oral Cipro.  He received a 7-day course.  He has continued to make good overall progress, albeit slow in regards to physical recovery.  Home health is arranged for nursing as well as physical and occupational therapies. DME equipment has been arranged to assist with his care at home.  At the time of discharge the patient is felt to be quite stable.

## 2022-04-06 NOTE — Progress Notes (Signed)
Initial Nutrition Assessment  DOCUMENTATION CODES:   Non-severe (moderate) malnutrition in context of chronic illness  INTERVENTION:   Initiate tube feeds via J-tube: - Start Osmolite 1.5 @ 20 ml/hr and advance by 10 ml q 8 hours to goal rate of 70 ml/hr (1680 ml/day) - PROSource TF20 60 ml once daily per tube - Free water flushes of 100 ml q 4 hours  Tube feeding regimen at goal rate provides 2600 kcal, 125 grams of protein, and 1280 ml of H2O.   Total free water with flushes: 1880 ml  - Once pt demonstrates tolerance of continuous tube feeds at goal rate, RD will begin to cycle feeds  NUTRITION DIAGNOSIS:   Moderate Malnutrition related to chronic illness (esophageal cancer) as evidenced by mild muscle depletion, percent weight loss (9.2% weight loss in 5 months).  GOAL:   Patient will meet greater than or equal to 90% of their needs  MONITOR:   Diet advancement, Labs, Weight trends, TF tolerance, I & O's  REASON FOR ASSESSMENT:   Malnutrition Screening Tool, Consult Enteral/tube feeding initiation and management  ASSESSMENT:   61 year old male with PMH of esophageal adenocarcinoma stage IIb s/p neoadjuvant chemoradiation, pre-DM, HTN. Pt did not complete all of his chemotherapy due to neutropenia but did complete radiation on 02/02/22.  09/27 - s/p Ivor Lewis esophagectomy, J-tube placement  Pt with NG tube to low intermittent suction. J-tube is in place, currently clamped.  Spoke with pt and wife at bedside. Pt reports that the [facility] has supplied him with tube feeding formula and supplies for use after discharge. Pt reports that he told the [facility] that he was getting "a tube in my stomach," so supplies for bolus feeds (cartons of tube feeding formula and syringes) were sent to him. Pt has a J-tube, not a G-tube, and will therefore require a pump for administration of continuous vs cyclic tube feeds as bolus feeds via J-tube are contraindicated. Discussed with pt and wife who  provided RD with a phone number of someone at the [facility] to call and discuss Faye Ramsay, [phone]). The phone number did not connect to the [facility] RD. RD relayed the above information to Case Manager via secure chat. Pt will require supplies for continuous/cyclic tube feeds at discharge, including a feeding pump and bags in which to hang tube feeding formula.  Pt was provided with multiple cases of Jevity 1.5 formula and instructed to bolus 7 cartons a day with a 90 ml free water flush before and after each bolus feed. Explained to pt and wife that pt will be receiving tube feeds via pump. Discussed plan to start with fiber-free formula (Osmolite 1.5) with potential to transition to fiber-containing formula (Jevity 1.5) later on once pt demonstrates tolerates of tube feeds at goal regimen. Explained that the amount of Jevity 1.5 formula required to meet pt's estimated kcal and protein needs would provide a large amount of fiber that may not be well-tolerated at first. Discussed plan to start at low rate and slowly advance tube feeds to goal to promote tolerance.  Pt endorses weight loss over the course of treatment. He reports a UBW of 360 lbs and states that he last weighed this in April 2023. He states that he now weighs 331 lbs. He believes that his weight loss has stabilized over the last month and that he has not been losing weight quite as rapidly as he did at first. Reviewed weight history in chart. Pt with an overall weight loss of 15.3  kg since 11/01/21. This is a 9.2% weight loss in 5 months which is significant for timeframe. Based on weight loss and NFPE, pt meets criteria for moderate chronic malnutrition.  Pt reports that he has typically been eating 2 meals daily at home in the weeks prior to surgery. He consumes mostly beverages (milk, juice, water, Gatorade Zero, oral nutrition supplements), pureed foods, and very soft foods like oatmeal or soft scrambled eggs. For breakfast, pt usually consumes a glass  of milk, a glass of juice, and a Boost Very High Calorie shake (530 kcal, 22 grams of protein). If pt consumes scrambled eggs with breakfast, he will not consume the oral nutrition supplement. Pt also takes gummy vitamins (MVI, iron, vitamin B-12). For lunch, pt typically consumes a soup with beans and meat, a glass of milk, Gatorade Zero, and a Boost Very High Calorie shake. Pt does not usually eat dinner but may drink a Boost Very High Calorie shake in the evening or consume jello, pudding, or ice cream.  Medications reviewed and include: SSI q 4 hours, IV protonix, IV abx  Labs reviewed: creatinine 1.63, hemoglobin 9.3 CBG's: 100-159 x 24 hours  UOP: 1605 ml x 24 hours NGT: 0 ml x 24 hours JP drain: 0 ml x 24 hours J-tube: 30 ml x 24 hours Chest tube: 460 ml x 24 hours I/O's: +1.2 L since admit  NUTRITION - FOCUSED PHYSICAL EXAM:  Flowsheet Row Most Recent Value  Orbital Region Mild depletion  Upper Arm Region No depletion  Thoracic and Lumbar Region No depletion  Buccal Region No depletion  Temple Region Mild depletion  Clavicle Bone Region Mild depletion  Clavicle and Acromion Bone Region Mild depletion  Scapular Bone Region Mild depletion  Dorsal Hand Mild depletion  Patellar Region Mild depletion  Anterior Thigh Region Mild depletion  Posterior Calf Region Mild depletion  Edema (RD Assessment) None  Hair Reviewed  Eyes Reviewed  Mouth Reviewed  Skin Reviewed  Nails Reviewed       Diet Order:   Diet Order             Diet NPO time specified  Diet effective now                   EDUCATION NEEDS:   Education needs have been addressed  Skin:  Skin Assessment: Reviewed RN Assessment (incisions to chest and abdomen)  Last BM:  no documented BM  Height:   Ht Readings from Last 1 Encounters:  04/05/22 6' (1.829 m)    Weight:   Wt Readings from Last 1 Encounters:  04/05/22 (!) 150.3 kg    Ideal Body Weight:  80.9 kg  BMI:  Body mass index is  44.95 kg/m.  Estimated Nutritional Needs:   Kcal:  2500-2700  Protein:  120-140 grams  Fluid:  >/= 2.2 L    Gustavus Bryant, MS, RD, LDN Inpatient Clinical Dietitian Please see AMiON for contact information.

## 2022-04-06 NOTE — Progress Notes (Addendum)
[address]       [place] [address]             [phone]      1 Day Post-Op Procedure(s) (LRB): XI ROBOTIC ASSISTED IVOR LEWIS ESOPHAGECTOMY (N/A) XI ROBOTIC ASSISTED JEJUNOSTOMY TUBE PLACEMENT (N/A) ESOPHAGOGASTRODUODENOSCOPY (EGD) (N/A) NODE DISSECTION INTERCOSTAL NERVE BLOCK Subjective: Feels reasonably well, some sore throat, was confused- now not  Objective: Vital signs in last 24 hours: Temp:  [97.2 F (36.2 C)-98.7 F (37.1 C)] 98.2 F (36.8 C) (09/28 0400) Pulse Rate:  [85-91] 91 (09/28 0400) Cardiac Rhythm: Heart block;Bundle branch block (09/27 2220) Resp:  [17-29] 23 (09/28 0400) BP: (146-177)/(79-107) 164/87 (09/28 0400) SpO2:  [95 %-98 %] 97 % (09/28 0400)  Hemodynamic parameters for last 24 hours:    Intake/Output from previous day: 09/27 0701 - 09/28 0700 In: 3550.2 [I.V.:2700; IV Piggyback:850.2] Out: 2295 [Urine:1605; Drains:30; Blood:200; Chest Tube:460] Intake/Output this shift: No intake/output data recorded.  General appearance: alert, cooperative, and no distress Heart: regular rate and rhythm Lungs: fairly clear anteriorly Abdomen: non distended, quiet Extremities: no edema Wound: incis healing well  Lab Results: Recent Labs    04/03/22 1407  WBC 4.6  HGB 10.5*  HCT 31.0*  PLT 176   BMET:  Recent Labs    04/03/22 1407  NA 135  K 3.6  CL 106  CO2 19*  GLUCOSE 101*  BUN 22  CREATININE 1.45*  CALCIUM 13.3*    PT/INR:  Recent Labs    04/03/22 1407  LABPROT 14.4  INR 1.1   ABG    Component Value Date/Time   PHART 7.41 04/06/2022 0332   HCO3 24.1 04/06/2022 0332   TCO2 28 11/13/2021 2122   ACIDBASEDEF 0.3 04/06/2022 0332   O2SAT 98 04/06/2022 0332   CBG (last 3)  Recent Labs    04/05/22 1201 04/06/22 0002 04/06/22 0429  GLUCAP 159* 143* 111*    Meds Scheduled Meds:  Chlorhexidine Gluconate Cloth  6 each Topical Daily   insulin aspart  0-24 Units Subcutaneous Q4H   olopatadine   1 drop Both Eyes BID   pantoprazole (PROTONIX) IV  40 mg Intravenous Q12H   Continuous Infusions:  acetaminophen 1,000 mg (04/06/22 0641)   cefOXitin 2 g (04/06/22 0513)   PRN Meds:.albuterol, morphine injection, ondansetron (ZOFRAN) IV  Xrays DG CHEST PORT 1 VIEW  Result Date: 04/05/2022 CLINICAL DATA:  Postop chest surgery.  Esophageal cancer EXAM: PORTABLE CHEST 1 VIEW COMPARISON:  Chest 04/03/2022 FINDINGS: Right chest tube in place. No pneumothorax. Bibasilar atelectasis right greater than left NG tube in the esophagus which is deviated to the right due to surgery. NG extends into the stomach. No significant pleural effusion Port-A-Cath tip at the cavoatrial junction unchanged. IMPRESSION: Right chest tube in place.  No pneumothorax Bibasilar atelectasis right greater than left. Postop changes in the right lung base. Electronically Signed   By: Franchot Gallo M.D.   On: 04/05/2022 17:36    Assessment/Plan: S/P Procedure(s) (LRB): XI ROBOTIC ASSISTED IVOR LEWIS ESOPHAGECTOMY (N/A) XI ROBOTIC ASSISTED JEJUNOSTOMY TUBE PLACEMENT (N/A) ESOPHAGOGASTRODUODENOSCOPY (EGD) (N/A) NODE DISSECTION INTERCOSTAL NERVE BLOCK POD#1  1 afeb, S BP 140's-170's, sinus tachy, SR currently 2 O2 sats good on 3 liters 3 UOP faily good, BMET- pending 4 CT 460 /JP- 0, leave both in place, NGT- some bileous drainage in tube 5 CBC is pending 6 ABG normal  7 CXR - pending 8 will order prn hydralazine for HTN 9  routine pulm hygiene , mobilize as able     LOS: 1 day    [name] PA-C Pager [phone] 04/06/2022    Agree with above Will start tube feeds today Ambulation Pain control Adjusting HTN meds Swallow on Monday  Rashad Obeid O Brekyn [place]

## 2022-04-06 NOTE — TOC Initial Note (Addendum)
Transition of Care [facility]) - Initial/Assessment Note    Patient Details  Name: Keith Moreno MRN: [id number] Date of Birth: 08-30-1960  [facility] CM/SW Contact:    Angelita Ingles, RN Phone Number:[phone]  04/06/2022, 1:39 PM  Clinical Narrative:                 TOC received message from [name] RD stating that patient will need assistance with tube feeds and supplies when ready for discharge. Patient currently recieves supplies from [place] but the current supplies are not correct. Patients home tube feeds have not been determined for sure per Nunzio Cory. CM unable to request tube feeds and supplies at this time. CM will follow up to order when home regimen is determined.         Patient Goals and CMS Choice        Expected Discharge Plan and Services                                                Prior Living Arrangements/Services                       Activities of Daily Living Home Assistive Devices/Equipment: Scales, CBG Meter, Blood pressure cuff, Eyeglasses ADL Screening (condition at time of admission) Patient's cognitive ability adequate to safely complete daily activities?: Yes Is the patient deaf or have difficulty hearing?: No Does the patient have difficulty seeing, even when wearing glasses/contacts?: No Does the patient have difficulty concentrating, remembering, or making decisions?: Yes Patient able to express need for assistance with ADLs?: Yes Does the patient have difficulty dressing or bathing?: No Independently performs ADLs?: Yes (appropriate for developmental age) Does the patient have difficulty walking or climbing stairs?: Yes Weakness of Legs: Both Weakness of Arms/Hands: None  Permission Sought/Granted                  Emotional Assessment              Admission diagnosis:  Esophageal adenocarcinoma ([place]) [C15.9] Esophageal cancer ([place]) [C15.9] Patient Active Problem List   Diagnosis  Date Noted   Malnutrition of moderate degree 04/06/2022   Esophageal cancer ([place]) 04/05/2022   Port-A-Cath in place 12/26/2021   Esophageal adenocarcinoma ([place]) 12/12/2021   Mediastinal adenopathy 12/08/2021   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

## 2022-04-06 NOTE — Plan of Care (Signed)
  Problem: Education: Goal: Knowledge of General Education information will improve Description: Including pain rating scale, medication(s)/side effects and non-pharmacologic comfort measures Outcome: Progressing   Problem: Health Behavior/Discharge Planning: Goal: Ability to manage health-related needs will improve Outcome: Progressing   Problem: Clinical Measurements: Goal: Ability to maintain clinical measurements within normal limits will improve Outcome: Progressing Goal: Will remain free from infection Outcome: Progressing Goal: Diagnostic test results will improve Outcome: Progressing Goal: Respiratory complications will improve Outcome: Progressing Goal: Cardiovascular complication will be avoided Outcome: Progressing   Problem: Activity: Goal: Risk for activity intolerance will decrease Outcome: Progressing   Problem: Nutrition: Goal: Adequate nutrition will be maintained Outcome: Progressing   Problem: Coping: Goal: Level of anxiety will decrease Outcome: Progressing   Problem: Elimination: Goal: Will not experience complications related to bowel motility Outcome: Progressing Goal: Will not experience complications related to urinary retention Outcome: Progressing   Problem: Pain Managment: Goal: General experience of comfort will improve Outcome: Progressing   Problem: Safety: Goal: Ability to remain free from injury will improve Outcome: Progressing   Problem: Skin Integrity: Goal: Risk for impaired skin integrity will decrease Outcome: Progressing   Problem: Education: Goal: Knowledge of the prescribed therapeutic regimen will improve Outcome: Progressing   Problem: Bowel/Gastric: Goal: Gastrointestinal status for postoperative course will improve Outcome: Progressing   Problem: Nutritional: Goal: Ability to achieve adequate nutritional intake will improve Outcome: Progressing   Problem: Clinical Measurements: Goal: Postoperative  complications will be avoided or minimized Outcome: Progressing   Problem: Respiratory: Goal: Ability to maintain a clear airway will improve Outcome: Progressing   Problem: Skin Integrity: Goal: Demonstration of wound healing without infection will improve Outcome: Progressing   

## 2022-04-06 NOTE — Plan of Care (Signed)
Discussed with patient in front of wife plan of care for the evening, pain management and safety mittens possible if face is touched potentially removing tubes with some teach back displayed.    Problem: Education: Goal: Knowledge of General Education information will improve Description: Including pain rating scale, medication(s)/side effects and non-pharmacologic comfort measures Outcome: Progressing   Problem: Health Behavior/Discharge Planning: Goal: Ability to manage health-related needs will improve Outcome: Progressing

## 2022-04-07 ENCOUNTER — Inpatient Hospital Stay (HOSPITAL_COMMUNITY): Payer: No Typology Code available for payment source

## 2022-04-07 LAB — GLUCOSE, CAPILLARY
Glucose-Capillary: 121 mg/dL — ABNORMAL HIGH (ref 70–99)
Glucose-Capillary: 134 mg/dL — ABNORMAL HIGH (ref 70–99)
Glucose-Capillary: 138 mg/dL — ABNORMAL HIGH (ref 70–99)
Glucose-Capillary: 156 mg/dL — ABNORMAL HIGH (ref 70–99)

## 2022-04-07 LAB — COMPREHENSIVE METABOLIC PANEL
ALT: 115 U/L — ABNORMAL HIGH (ref 0–44)
AST: 83 U/L — ABNORMAL HIGH (ref 15–41)
Albumin: 3.1 g/dL — ABNORMAL LOW (ref 3.5–5.0)
Alkaline Phosphatase: 64 U/L (ref 38–126)
Anion gap: 4 — ABNORMAL LOW (ref 5–15)
BUN: 17 mg/dL (ref 8–23)
CO2: 27 mmol/L (ref 22–32)
Calcium: 11.8 mg/dL — ABNORMAL HIGH (ref 8.9–10.3)
Chloride: 106 mmol/L (ref 98–111)
Creatinine, Ser: 1.15 mg/dL (ref 0.61–1.24)
GFR, Estimated: >60 mL/min (ref >60)
Glucose, Bld: 139 mg/dL — ABNORMAL HIGH (ref 70–99)
Potassium: 3.5 mmol/L (ref 3.5–5.1)
Sodium: 137 mmol/L (ref 135–145)
Total Bilirubin: 1.1 mg/dL (ref 0.3–1.2)
Total Protein: 5.9 g/dL — ABNORMAL LOW (ref 6.5–8.1)

## 2022-04-07 LAB — CBC
HCT: 28.2 % — ABNORMAL LOW (ref 39.0–52.0)
Hemoglobin: 9.5 g/dL — ABNORMAL LOW (ref 13.0–17.0)
MCH: 31.8 pg (ref 26.0–34.0)
MCHC: 33.7 g/dL (ref 30.0–36.0)
MCV: 94.3 fL (ref 80.0–100.0)
Platelets: 170 10*3/uL (ref 150–400)
RBC: 2.99 MIL/uL — ABNORMAL LOW (ref 4.22–5.81)
RDW: 14.4 % (ref 11.5–15.5)
WBC: 8.9 10*3/uL (ref 4.0–10.5)
nRBC: 0 % (ref 0.0–0.2)

## 2022-04-07 LAB — SURGICAL PATHOLOGY

## 2022-04-07 NOTE — Progress Notes (Signed)
Brief Nutrition Note  Discussed pt with RN. Pt tolerating Osmolite 1.5 tube feeds at rate of 40 ml/hr via J-tube without difficulty (goal rate of 70 ml/hr). NG tube remains in place.  Spoke with pt and significant other at bedside. Discussed plan to continue to advance tube feeds slowly to goal rate of 70 ml/hr.  Once pt demonstrates ability to tolerate Osmolite 1.5 tube feeds at goal rate of 70 ml/hr, recommend transitioning to cyclic/nocturnal tube feeds.  Cyclic/nocturnal tube feeding recommendation: - Osmolite 1.5 @ 105 ml/hr x 16 hours from 1800 to 1000 (total of 1680 ml) via J-tube - PROSource TF20 60 ml daily - Free water flushes of 100 ml q 4 hours  Cyclic/nocturnal tube feeding regimen would provide 2600 kcal, 125 grams of protein, and 1880 ml of H2O.  RD will continue to follow pt during admission.   Gustavus Bryant, MS, RD, LDN Inpatient Clinical Dietitian Please see AMiON for contact information.

## 2022-04-07 NOTE — Plan of Care (Signed)
  Problem: Education: Goal: Knowledge of General Education information will improve Description: Including pain rating scale, medication(s)/side effects and non-pharmacologic comfort measures Outcome: Progressing   Problem: Health Behavior/Discharge Planning: Goal: Ability to manage health-related needs will improve Outcome: Progressing   Problem: Clinical Measurements: Goal: Ability to maintain clinical measurements within normal limits will improve Outcome: Progressing Goal: Will remain free from infection Outcome: Progressing Goal: Diagnostic test results will improve Outcome: Progressing Goal: Respiratory complications will improve Outcome: Progressing Goal: Cardiovascular complication will be avoided Outcome: Progressing   Problem: Activity: Goal: Risk for activity intolerance will decrease Outcome: Progressing   Problem: Nutrition: Goal: Adequate nutrition will be maintained Outcome: Progressing   Problem: Coping: Goal: Level of anxiety will decrease Outcome: Progressing   Problem: Elimination: Goal: Will not experience complications related to bowel motility Outcome: Progressing Goal: Will not experience complications related to urinary retention Outcome: Progressing   Problem: Pain Managment: Goal: General experience of comfort will improve Outcome: Progressing   Problem: Safety: Goal: Ability to remain free from injury will improve Outcome: Progressing   Problem: Skin Integrity: Goal: Risk for impaired skin integrity will decrease Outcome: Progressing   Problem: Education: Goal: Knowledge of the prescribed therapeutic regimen will improve Outcome: Progressing   Problem: Bowel/Gastric: Goal: Gastrointestinal status for postoperative course will improve Outcome: Progressing   Problem: Nutritional: Goal: Ability to achieve adequate nutritional intake will improve Outcome: Progressing   Problem: Clinical Measurements: Goal: Postoperative  complications will be avoided or minimized Outcome: Progressing   Problem: Respiratory: Goal: Ability to maintain a clear airway will improve Outcome: Progressing   Problem: Skin Integrity: Goal: Demonstration of wound healing without infection will improve Outcome: Progressing   Problem: Safety: Goal: Non-violent Restraint(s) Outcome: Progressing

## 2022-04-07 NOTE — Progress Notes (Signed)
Foley removed without complication.

## 2022-04-07 NOTE — Progress Notes (Signed)
Mobility Specialist Progress Note    04/07/22 1408  Mobility  Activity Ambulated with assistance in hallway  Level of Assistance +2 (takes two people) (line management)  Assistive Device Four wheel walker  Distance Ambulated (ft) 120 ft (60+60)  Activity Response Tolerated well  $Mobility charge 1 Mobility   Pt received in chair and agreeable. Pain increased from ~5/10 to ~7/10, RN aware. Took x1 seated rest break. Returned to chair with call bell in reach.   Hildred Alamin Mobility Specialist

## 2022-04-07 NOTE — Progress Notes (Addendum)
[address]       [place] [address]             [phone]      2 Days Post-Op Procedure(s) (LRB): XI ROBOTIC ASSISTED IVOR LEWIS ESOPHAGECTOMY (N/A) XI ROBOTIC ASSISTED JEJUNOSTOMY TUBE PLACEMENT (N/A) ESOPHAGOGASTRODUODENOSCOPY (EGD) (N/A) NODE DISSECTION INTERCOSTAL NERVE BLOCK Subjective: Feels dehydrated  Objective: Vital signs in last 24 hours: Temp:  [97.8 F (36.6 C)-98.5 F (36.9 C)] 98.4 F (36.9 C) (09/29 0300) Pulse Rate:  [74-92] 84 (09/29 0600) Cardiac Rhythm: Heart block;Bundle branch block (09/28 1923) Resp:  [14-23] 16 (09/29 0600) BP: (142-167)/(70-88) 142/72 (09/29 0600) SpO2:  [91 %-96 %] 93 % (09/29 0600) Weight:  [148.8 kg] 148.8 kg (09/29 0310)  Hemodynamic parameters for last 24 hours:    Intake/Output from previous day: 09/28 0701 - 09/29 0700 In: 1135.9 [NG/GT:757.3; IV Piggyback:303.6] Out: 2145 [Urine:1950; Drains:25; Chest Tube:170] Intake/Output this shift: No intake/output data recorded.  General appearance: alert, cooperative, and no distress Heart: regular rate and rhythm Lungs: clear to auscultation bilaterally Abdomen: minor tenderness Extremities: minimal edema Wound: incis healing well  Lab Results: Recent Labs    04/06/22 0630  WBC 8.2  HGB 9.3*  HCT 27.2*  PLT 150   BMET:  Recent Labs    04/06/22 0630  NA 139  K 3.8  CL 109  CO2 24  GLUCOSE 105*  BUN 17  CREATININE 1.63*  CALCIUM 11.7*    PT/INR: No results for input(s): "LABPROT", "INR" in the last 72 hours. ABG    Component Value Date/Time   PHART 7.41 04/06/2022 0332   HCO3 24.1 04/06/2022 0332   TCO2 28 11/13/2021 2122   ACIDBASEDEF 0.3 04/06/2022 0332   O2SAT 98 04/06/2022 0332   CBG (last 3)  Recent Labs    04/06/22 1924 04/06/22 2324 04/07/22 0306  GLUCAP 132* 134* 121*    Meds Scheduled Meds:  Chlorhexidine Gluconate Cloth  6 each Topical Daily   enoxaparin (LOVENOX) injection  75 mg Subcutaneous Q24H    feeding supplement (PROSource TF20)  60 mL Per Tube Daily   free water  100 mL Per Tube Q4H   insulin aspart  0-24 Units Subcutaneous Q4H   olopatadine  1 drop Both Eyes BID   pantoprazole (PROTONIX) IV  40 mg Intravenous Q12H   Continuous Infusions:  feeding supplement (OSMOLITE 1.5 CAL) 30 mL/hr at 04/07/22 0600   PRN Meds:.albuterol, hydrALAZINE, HYDROcodone-acetaminophen, morphine injection, ondansetron (ZOFRAN) IV  Xrays DG Chest 1 View  Result Date: 04/06/2022 CLINICAL DATA:  Status post esophagectomy and gastric pull-through. EXAM: CHEST  1 VIEW COMPARISON:  04/05/2022 FINDINGS: The support apparatus is stable. No complicating features. No right-sided pneumothorax. Stable small amount of right-sided subcutaneous emphysema. Stable prominent mediastinal contours in part due to the gastric pull-through procedure. Low lung volumes with bibasilar atelectasis but no large pleural effusions or obvious pulmonary edema. IMPRESSION: 1. Stable support apparatus. 2. No pneumothorax. 3. Low lung volumes with bibasilar atelectasis. Electronically Signed   By: Marijo Sanes M.D.   On: 04/06/2022 08:13   DG CHEST PORT 1 VIEW  Result Date: 04/05/2022 CLINICAL DATA:  Postop chest surgery.  Esophageal cancer EXAM: PORTABLE CHEST 1 VIEW COMPARISON:  Chest 04/03/2022 FINDINGS: Right chest tube in place. No pneumothorax. Bibasilar atelectasis right greater than left NG tube in the esophagus which is deviated to the right due to surgery. NG extends into the stomach. No significant pleural effusion Port-A-Cath tip at  the cavoatrial junction unchanged. IMPRESSION: Right chest tube in place.  No pneumothorax Bibasilar atelectasis right greater than left. Postop changes in the right lung base. Electronically Signed   By: Franchot Gallo M.D.   On: 04/05/2022 17:36    Assessment/Plan: S/P Procedure(s) (LRB): XI ROBOTIC ASSISTED IVOR LEWIS ESOPHAGECTOMY (N/A) XI ROBOTIC ASSISTED JEJUNOSTOMY TUBE PLACEMENT  (N/A) ESOPHAGOGASTRODUODENOSCOPY (EGD) (N/A) NODE DISSECTION INTERCOSTAL NERVE BLOCK  POD#2  1 afebrile, s BP 140's-160's, sinus rhythm, some PVC's 2 sats good on 2 liters 3 J-TF's have been initiated 4 good UOP 5 NGT- 100 cc, bileous 6 CT 170 cc/ bulb drain 25 cc 7 no new labs- will order 8 CXR - similar appearance to mediastinum 9 DVT PPX- lovenox per pharm recs 10 OOB as able 12 pulm hygiene 13 leave all tubes for now, d/c foley     LOS: 2 days    [name] PA-C Pager [phone] 04/07/2022   Agree with above. Overall doing well.  Tolerating J-tube feeds. Minimal chest tube output, minimal NG tube output. Swallow study on Monday.  Keith Moreno

## 2022-04-08 ENCOUNTER — Other Ambulatory Visit: Payer: Self-pay

## 2022-04-08 LAB — GLUCOSE, CAPILLARY: Glucose-Capillary: 135 mg/dL — ABNORMAL HIGH (ref 70–99)

## 2022-04-08 NOTE — Progress Notes (Signed)
[address]       [place], [address]             [phone]      3 Days Post-Op Procedure(s) (LRB): XI ROBOTIC ASSISTED IVOR LEWIS ESOPHAGECTOMY (N/A) XI ROBOTIC ASSISTED JEJUNOSTOMY TUBE PLACEMENT (N/A) ESOPHAGOGASTRODUODENOSCOPY (EGD) (N/A) NODE DISSECTION INTERCOSTAL NERVE BLOCK Subjective: Some discomfort, ambulated some yesteday  Objective: Vital signs in last 24 hours: Temp:  [97.7 F (36.5 C)-98.4 F (36.9 C)] 98 F (36.7 C) (09/30 0742) Pulse Rate:  [67-95] 80 (09/30 0742) Cardiac Rhythm: Bundle branch block;Heart block (09/30 0700) Resp:  [14-21] 18 (09/30 0742) BP: (111-158)/(68-84) 128/75 (09/30 0742) SpO2:  [91 %-97 %] 96 % (09/30 0742) Weight:  [149.7 kg] 149.7 kg (09/30 0315)  Hemodynamic parameters for last 24 hours:    Intake/Output from previous day: 09/29 0701 - 09/30 0700 In: 1429.3 [NG/GT:1429.3] Out: 970 [Urine:550; Emesis/NG output:200; Drains:20; Chest Tube:200] Intake/Output this shift: Total I/O In: 535.8 [Other:50; NG/GT:485.8] Out: 715 [Urine:650; Drains:15; Chest Tube:50]  General appearance: alert, cooperative, and no distress Heart: regular rate and rhythm Lungs: some right basilar rales Abdomen: benign Extremities: no edema Wound: incis healing well  Lab Results: Recent Labs    04/06/22 0630 04/07/22 0756  WBC 8.2 8.9  HGB 9.3* 9.5*  HCT 27.2* 28.2*  PLT 150 170   BMET:  Recent Labs    04/06/22 0630 04/07/22 0756  NA 139 137  K 3.8 3.5  CL 109 106  CO2 24 27  GLUCOSE 105* 139*  BUN 17 17  CREATININE 1.63* 1.15  CALCIUM 11.7* 11.8*    PT/INR: No results for input(s): "LABPROT", "INR" in the last 72 hours. ABG    Component Value Date/Time   PHART 7.41 04/06/2022 0332   HCO3 24.1 04/06/2022 0332   TCO2 28 11/13/2021 2122   ACIDBASEDEF 0.3 04/06/2022 0332   O2SAT 98 04/06/2022 0332   CBG (last 3)  Recent Labs    04/07/22 1129 04/07/22 1940 04/08/22 0313  GLUCAP 138* 156* 135*     Meds Scheduled Meds:  Chlorhexidine Gluconate Cloth  6 each Topical Daily   enoxaparin (LOVENOX) injection  75 mg Subcutaneous Q24H   feeding supplement (PROSource TF20)  60 mL Per Tube Daily   free water  100 mL Per Tube Q4H   insulin aspart  0-24 Units Subcutaneous Q4H   olopatadine  1 drop Both Eyes BID   pantoprazole (PROTONIX) IV  40 mg Intravenous Q12H   Continuous Infusions:  feeding supplement (OSMOLITE 1.5 CAL) 60 mL/hr at 04/08/22 0215   PRN Meds:.hydrALAZINE, HYDROcodone-acetaminophen, morphine injection, ondansetron (ZOFRAN) IV  Xrays DG Chest 1 View  Result Date: 04/07/2022 CLINICAL DATA:  Chest tube status post esophagectomy EXAM: CHEST  1 VIEW COMPARISON:  Prior chest x-ray 04/06/2022 FINDINGS: Stable cardiac and mediastinal contours consistent with esophagectomy and gastric pull-through. Right IJ single-lumen power injectable port catheter in unchanged position with the catheter overlying the cavoatrial junction. A right IJ central venous catheter is also in place with the tip overlying the mid SVC. Right-sided chest tube in stable position. Gastric tube present with the tip overlying the left upper abdomen. Persistent bibasilar atelectasis. No pneumothorax. Subcutaneous emphysema in the right chest wall and right neck soft tissues. IMPRESSION: 1. Stable and satisfactory support apparatus. 2. Slightly improved inspiratory volumes with decreasing bibasilar atelectasis. Electronically Signed   By: Jacqulynn Cadet M.D.   On: 04/07/2022 09:11    Assessment/Plan: S/P Procedure(s) (LRB):  XI ROBOTIC ASSISTED IVOR LEWIS ESOPHAGECTOMY (N/A) XI ROBOTIC ASSISTED JEJUNOSTOMY TUBE PLACEMENT (N/A) ESOPHAGOGASTRODUODENOSCOPY (EGD) (N/A) NODE DISSECTION INTERCOSTAL NERVE BLOCK POD#3  1 afeb, S BP 110's-150's, sinus rhythm,1 deg AVB,  PAC's 2 sats good on 2 liters [place] 3 adeq UOP, not all recorded 4 NGT - 200 cc/24 h, less bileous 5 CT 200 cc /24 h, serosang, JP- minimal 6  TF's- at 60/ hr, approaching goal rate 7 no new labs or CXR's, plan for swallow on Monday 8 lovenox for DVT ppx 9 pulm hygiene and rehab as able     LOS: 3 days    Keith Moreno 04/08/2022

## 2022-04-08 NOTE — Progress Notes (Signed)
Mobility Specialist Progress Note:   04/08/22 1536  Mobility  Activity Refused mobility   Pt refused mobility d/t dizziness and head/neck pain. Will f/u as able.    Seana Underwood Mobility Specialist-Acute Rehab Secure Chat only

## 2022-04-08 NOTE — Plan of Care (Signed)
Discussed with patient plan of care for the evening, pain management and medications with some teach back displayed ° °Problem: Education: °Goal: Knowledge of General Education information will improve °Description: Including pain rating scale, medication(s)/side effects and non-pharmacologic comfort measures °Outcome: Progressing °  °

## 2022-04-09 ENCOUNTER — Inpatient Hospital Stay (HOSPITAL_COMMUNITY): Payer: No Typology Code available for payment source

## 2022-04-09 LAB — GLUCOSE, CAPILLARY
Glucose-Capillary: 147 mg/dL — ABNORMAL HIGH (ref 70–99)
Glucose-Capillary: 146 mg/dL — ABNORMAL HIGH (ref 70–99)
Glucose-Capillary: 139 mg/dL — ABNORMAL HIGH (ref 70–99)
Glucose-Capillary: 131 mg/dL — ABNORMAL HIGH (ref 70–99)
Glucose-Capillary: 128 mg/dL — ABNORMAL HIGH (ref 70–99)

## 2022-04-09 LAB — BASIC METABOLIC PANEL
Anion gap: 11 (ref 5–15)
BUN: 25 mg/dL — ABNORMAL HIGH (ref 8–23)
CO2: 26 mmol/L (ref 22–32)
Calcium: 11.8 mg/dL — ABNORMAL HIGH (ref 8.9–10.3)
Chloride: 108 mmol/L (ref 98–111)
Creatinine, Ser: 1.03 mg/dL (ref 0.61–1.24)
GFR, Estimated: >60 mL/min (ref >60)
Glucose, Bld: 117 mg/dL — ABNORMAL HIGH (ref 70–99)
Potassium: 3.8 mmol/L (ref 3.5–5.1)
Sodium: 145 mmol/L (ref 135–145)

## 2022-04-09 LAB — CBC
HCT: 29.2 % — ABNORMAL LOW (ref 39.0–52.0)
Hemoglobin: 9.5 g/dL — ABNORMAL LOW (ref 13.0–17.0)
MCH: 31.1 pg (ref 26.0–34.0)
MCHC: 32.5 g/dL (ref 30.0–36.0)
MCV: 95.7 fL (ref 80.0–100.0)
Platelets: 196 10*3/uL (ref 150–400)
RBC: 3.05 MIL/uL — ABNORMAL LOW (ref 4.22–5.81)
RDW: 14.2 % (ref 11.5–15.5)
WBC: 8.2 10*3/uL (ref 4.0–10.5)
nRBC: 0 % (ref 0.0–0.2)

## 2022-04-09 MED ORDER — ALTEPLASE 2 MG IJ SOLR
2.0000 mg | Freq: Once | INTRAMUSCULAR | Status: AC
  Administered 2022-04-09: 2 mg
  Filled 2022-04-09: qty 2

## 2022-04-09 MED ORDER — ALTEPLASE 2 MG IJ SOLR
2.0000 mg | Freq: Once | INTRAMUSCULAR | Status: DC
  Filled 2022-04-09: qty 2

## 2022-04-09 MED ORDER — FREE WATER
200.0000 mL | Status: DC
  Administered 2022-04-09 – 2022-04-24 (×83): 200 mL

## 2022-04-09 NOTE — Plan of Care (Signed)
  Problem: Clinical Measurements: Goal: Respiratory complications will improve Outcome: Progressing   Problem: Nutrition: Goal: Adequate nutrition will be maintained Outcome: Progressing   Problem: Elimination: Goal: Will not experience complications related to urinary retention Outcome: Progressing   Problem: Pain Managment: Goal: General experience of comfort will improve Outcome: Progressing   Problem: Clinical Measurements: Goal: Cardiovascular complication will be avoided Outcome: Not Progressing   Problem: Activity: Goal: Risk for activity intolerance will decrease Outcome: Not Progressing

## 2022-04-09 NOTE — Progress Notes (Addendum)
[address]       [place] [address]             [phone]      4 Days Post-Op Procedure(s) (LRB): XI ROBOTIC ASSISTED IVOR LEWIS ESOPHAGECTOMY (N/A) XI ROBOTIC ASSISTED JEJUNOSTOMY TUBE PLACEMENT (N/A) ESOPHAGOGASTRODUODENOSCOPY (EGD) (N/A) NODE DISSECTION INTERCOSTAL NERVE BLOCK Subjective: Some pain but reasonably controlled  Objective: Vital signs in last 24 hours: Temp:  [97.6 F (36.4 C)-98.4 F (36.9 C)] 97.6 F (36.4 C) (10/01 0742) Pulse Rate:  [85-110] 85 (10/01 0742) Cardiac Rhythm: Sinus tachycardia (09/30 1930) Resp:  [16-22] 22 (10/01 0742) BP: (126-146)/(54-83) 143/78 (10/01 0742) SpO2:  [92 %-96 %] 96 % (10/01 0742)  Hemodynamic parameters for last 24 hours:    Intake/Output from previous day: 09/30 0701 - 10/01 0700 In: 1891 [NG/GT:1841] Out: 3225 [Urine:2500; Emesis/NG output:350; Drains:35; Chest Tube:340] Intake/Output this shift: No intake/output data recorded.  General appearance: alert, cooperative, and no distress Heart: regular rate and rhythm Lungs: clear Abdomen: benign Extremities: no edema Wound: incis healing well  Lab Results: Recent Labs    04/07/22 0756 04/09/22 0440  WBC 8.9 8.2  HGB 9.5* 9.5*  HCT 28.2* 29.2*  PLT 170 196   BMET:  Recent Labs    04/07/22 0756 04/09/22 0440  NA 137 145  K 3.5 3.8  CL 106 108  CO2 27 26  GLUCOSE 139* 117*  BUN 17 25*  CREATININE 1.15 1.03  CALCIUM 11.8* 11.8*    PT/INR: No results for input(s): "LABPROT", "INR" in the last 72 hours. ABG    Component Value Date/Time   PHART 7.41 04/06/2022 0332   HCO3 24.1 04/06/2022 0332   TCO2 28 11/13/2021 2122   ACIDBASEDEF 0.3 04/06/2022 0332   O2SAT 98 04/06/2022 0332   CBG (last 3)  Recent Labs    04/08/22 0313 04/09/22 0317 04/09/22 0746  GLUCAP 135* 147* 146*    Meds Scheduled Meds:  Chlorhexidine Gluconate Cloth  6 each Topical Daily   enoxaparin (LOVENOX) injection  75 mg Subcutaneous Q24H    feeding supplement (PROSource TF20)  60 mL Per Tube Daily   free water  100 mL Per Tube Q4H   insulin aspart  0-24 Units Subcutaneous Q4H   olopatadine  1 drop Both Eyes BID   pantoprazole (PROTONIX) IV  40 mg Intravenous Q12H   Continuous Infusions:  feeding supplement (OSMOLITE 1.5 CAL) 70 mL/hr at 04/09/22 0321   PRN Meds:.hydrALAZINE, HYDROcodone-acetaminophen, morphine injection, ondansetron (ZOFRAN) IV  Xrays DG Chest Port 1 View  Result Date: 04/09/2022 CLINICAL DATA:  Follow-up exam.  Esophagectomy. EXAM: PORTABLE CHEST 1 VIEW COMPARISON:  04/07/2022 and older exams. FINDINGS: Basilar atelectasis, unchanged from the previous exam. Remainder of the lungs is clear. No convincing pleural effusion.  No pneumothorax. Nasal/orogastric tube, right internal jugular central venous lines and right-sided chest tube are stable. IMPRESSION: 1. No significant change from the most recent prior exam. 2. Mild persistent lung base atelectasis. 3. Support apparatus is stable in well positioned. 4. No pneumothorax. Electronically Signed   By: Lajean Manes M.D.   On: 04/09/2022 08:39    Assessment/Plan: S/P Procedure(s) (LRB): XI ROBOTIC ASSISTED IVOR LEWIS ESOPHAGECTOMY (N/A) XI ROBOTIC ASSISTED JEJUNOSTOMY TUBE PLACEMENT (N/A) ESOPHAGOGASTRODUODENOSCOPY (EGD) (N/A) NODE DISSECTION INTERCOSTAL NERVE BLOCK POD#4   1 afeb, sinus rhythm/sinus tachy, s BP 120's-140's, hydralazine till can take po home meds 2 O2 sats good on 2 liters 3 good UOP, TF's at goal 4  NGT 350 cc/24 /h 5 bulb drain- 35 cc/24 h 6 CT 350 cc/24 hr 7 BUN up slightly to 25, creat normal- cont free water 8 H/H trend improved 9 BS mildly elevated- cont SSI till po meds can be started 10 lovenox for DVT ppx 11 rehab and pulm hygiene 12 swallow study tomorrow       LOS: 4 days    Keith Moreno 04/09/2022  Patient seen and examined, agree with above Anxious about swallow results  Remo Lipps C. Roxan Hockey, MD [facility] [phone]

## 2022-04-09 NOTE — Progress Notes (Addendum)
Pt is complaining of trouble with breathing. Pt is not in any respiratory distress. RT informed. RT is in route and told RN to place pt on nasal canula O2 until she arrives. Pt right IG has no blood return after flushing both lumens. Phlebotomy informed and in informed RN he is in route to assess IJ.

## 2022-04-09 NOTE — Progress Notes (Signed)
Mobility Specialist Progress Note:   04/09/22 1403  Mobility  Activity Ambulated with assistance in hallway  Level of Assistance +2 (takes two people) (CG, Line Management)  Assistive Device Four wheel walker  Distance Ambulated (ft) 100 ft  Activity Response Tolerated well  $Mobility charge 1 Mobility   Pt received in bed and agreeable. Took 1x standing rest break then 1x seated rest break. C/o of fatigue and 9/10 pain in abdomen. Pt left in bed with all needs met, call bell in reach, and family in room.   Antwan Pandya Mobility Specialist-Acute Rehab Secure Chat only

## 2022-04-09 NOTE — Progress Notes (Signed)
Paged PA Jadene Pierini at 1030 about EKG findings, NSR with 1st degree heart block, occasional PVC's and a right bundle branch block. Patient resting in bed, no new orders at this time.

## 2022-04-10 ENCOUNTER — Inpatient Hospital Stay (HOSPITAL_COMMUNITY): Payer: No Typology Code available for payment source

## 2022-04-10 LAB — GLUCOSE, CAPILLARY
Glucose-Capillary: 114 mg/dL — ABNORMAL HIGH (ref 70–99)
Glucose-Capillary: 122 mg/dL — ABNORMAL HIGH (ref 70–99)
Glucose-Capillary: 126 mg/dL — ABNORMAL HIGH (ref 70–99)
Glucose-Capillary: 129 mg/dL — ABNORMAL HIGH (ref 70–99)
Glucose-Capillary: 129 mg/dL — ABNORMAL HIGH (ref 70–99)
Glucose-Capillary: 133 mg/dL — ABNORMAL HIGH (ref 70–99)
Glucose-Capillary: 134 mg/dL — ABNORMAL HIGH (ref 70–99)
Glucose-Capillary: 136 mg/dL — ABNORMAL HIGH (ref 70–99)
Glucose-Capillary: 139 mg/dL — ABNORMAL HIGH (ref 70–99)
Glucose-Capillary: 143 mg/dL — ABNORMAL HIGH (ref 70–99)
Glucose-Capillary: 145 mg/dL — ABNORMAL HIGH (ref 70–99)
Glucose-Capillary: 148 mg/dL — ABNORMAL HIGH (ref 70–99)
Glucose-Capillary: 154 mg/dL — ABNORMAL HIGH (ref 70–99)
Glucose-Capillary: 174 mg/dL — ABNORMAL HIGH (ref 70–99)

## 2022-04-10 MED ORDER — ACETAMINOPHEN 500 MG PO TABS
1000.0000 mg | ORAL_TABLET | Freq: Four times a day (QID) | ORAL | Status: DC | PRN
  Administered 2022-04-10 – 2022-04-17 (×3): 1000 mg via ORAL
  Filled 2022-04-10 (×5): qty 2

## 2022-04-10 MED ORDER — IOHEXOL 300 MG/ML  SOLN
80.0000 mL | Freq: Once | INTRAMUSCULAR | Status: AC | PRN
  Administered 2022-04-10: 80 mL via ORAL

## 2022-04-10 MED ORDER — OSMOLITE 1.5 CAL PO LIQD
1000.0000 mL | ORAL | Status: DC
  Administered 2022-04-10 – 2022-04-12 (×3): 1000 mL

## 2022-04-10 NOTE — Plan of Care (Signed)
  Problem: Clinical Measurements: Goal: Ability to maintain clinical measurements within normal limits will improve Outcome: Progressing Goal: Will remain free from infection Outcome: Progressing Goal: Diagnostic test results will improve Outcome: Progressing Goal: Respiratory complications will improve Outcome: Progressing Goal: Cardiovascular complication will be avoided Outcome: Progressing   Problem: Activity: Goal: Risk for activity intolerance will decrease Outcome: Progressing   Problem: Coping: Goal: Level of anxiety will decrease Outcome: Progressing   Problem: Pain Managment: Goal: General experience of comfort will improve Outcome: Progressing   Problem: Safety: Goal: Ability to remain free from injury will improve Outcome: Progressing   

## 2022-04-10 NOTE — TOC Progression Note (Addendum)
Transition of Care [facility]) - Progression Note    Patient Details  Name: Samantha Olivera MRN: [id number] Date of Birth: 1961/01/20  Transition of Care [facility] CM/SW [place], RN Phone Number:[phone]  04/10/2022, 12:43 PM  Clinical Narrative:    CM received message that wife is requesting DME . CM at bedside to speak with patient and wife. Wife is requesting hospital bed, wheelchair and BSC. CM explained to wife that there is currently no DME recommendations and that CM would need to have supporting documentation to show need for DME.  CM has reassured wife that CM will follow up for therapy recommendations.  [facility]. Spoke with April at [place] who confirms that patient receives his treatment at the [facility]. Provider - [place] [phone] ext [phone].   6144 CM called SW @ [place]. DME orders will need to be faxed. SW will email CM forms to be completed and faxed.   50 Cm has received urgent items for discharge form. Forms along with orders and supporting documentation must be faxed in order to process DME.  Will need therapy evaluation for DME recommendation and MD will need to enter orders.  Orders for PT/OT have been entered recommendations pending.      Expected Discharge Plan and Services                                                 Social Determinants of Health (SDOH) Interventions    Readmission Risk Interventions     No data to display

## 2022-04-10 NOTE — Progress Notes (Signed)
Mobility Specialist Progress Note    04/10/22 1624  Mobility  Activity Refused mobility   Pt c/o sciatica pain saying his L leg feels like it is on fire. RN aware. Will f/u as schedule permits.   Hildred Alamin Mobility Specialist

## 2022-04-10 NOTE — Progress Notes (Signed)
Initial Nutrition Assessment  DOCUMENTATION CODES:   Non-severe (moderate) malnutrition in context of chronic illness  INTERVENTION:   Transition to cyclic/nocturnal tube feeds via J-tube: - Osmolite 1.5 @ 105 ml/hr x 16 hours from 1800 to 1000 (total of 1680 ml) - PROSource TF20 60 ml daily - Free water flushes of 200 ml q 4 hours   Cyclic/nocturnal tube feeding regimen provides 2600 kcal, 125 grams of protein, and 2480 ml of H2O.  NUTRITION DIAGNOSIS:   Moderate Malnutrition related to chronic illness (esophageal cancer) as evidenced by mild muscle depletion, percent weight loss (9.2% weight loss in 5 months).  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 Pt did not complete all of his chemotherapy due to neutropenia but did complete radiation on 02/02/22.  09/27 - s/p Ivor Lewis esophagectomy, J-tube placement 10/02 - s/p swallow study showing no leak, NG tube removed, diet advanced to dysphagia 1 with thin liquids  Consult received to begin cycling tube feeds. Will start by cycling tube feeds over 16 hours.  Spoke with pt's wife at bedside. Pt sleeping at time of RD visit. Wife reports pt was pleased with results of swallow study and has already consumed small amounts of water and pureed food. Discussed plan to begin cycling tube feeds. Also discussed with RN.  As PO intake improves, can begin decreasing rate and/or time of cyclic/nocturnal tube feeds. RD will monitor for tolerance of PO diet and add oral nutrition supplement as appropriate.  Admit weight: 150.3 kg Current  weight: 147.2 kg  Medications reviewed and include: SSI q 4 hours, IV protonix  Labs reviewed: BUN 25, hemoglobin 9.5 CBG's: 122-145 x 24 hours  UOP: 1700 ml x 24 hours NGT: 100 ml x 24 hours (now removed) JP drain: 15 ml x 24 hours Chest tube: 80 ml x 24 hours I/O's: -1.2 L since admit  Diet Order:   Diet Order             DIET - DYS 1 Room service appropriate? Yes; Fluid consistency: Thin  Diet effective now                   EDUCATION NEEDS:   Education needs have been addressed  Skin:  Skin Assessment: Reviewed RN Assessment (incisions to chest and abdomen)  Last BM:  04/06/22  Height:   Ht Readings from Last 1 Encounters:  04/05/22 6' (1.829 m)    Weight:   Wt Readings from Last 1 Encounters:  04/10/22 (!) 147.2 kg    Ideal Body Weight:  80.9 kg  BMI:  Body mass index is 44.01 kg/m.  Estimated Nutritional Needs:   Kcal:  2500-2700  Protein:  120-140 grams  Fluid:  >/= 2.2 L    Gustavus Bryant, MS, RD, LDN Inpatient Clinical Dietitian Please see AMiON for contact information.

## 2022-04-10 NOTE — Progress Notes (Addendum)
[address]       [place] [address]             [phone]      5 Days Post-Op Procedure(s) (LRB): XI ROBOTIC ASSISTED IVOR LEWIS ESOPHAGECTOMY (N/A) XI ROBOTIC ASSISTED JEJUNOSTOMY TUBE PLACEMENT (N/A) ESOPHAGOGASTRODUODENOSCOPY (EGD) (N/A) NODE DISSECTION INTERCOSTAL NERVE BLOCK Subjective: Feels "sore", all over but mostly feels ok  Objective: Vital signs in last 24 hours: Temp:  [97.6 F (36.4 C)-99.3 F (37.4 C)] 97.9 F (36.6 C) (10/02 0352) Pulse Rate:  [85-106] 106 (10/02 0352) Cardiac Rhythm: Normal sinus rhythm;Bundle branch block (10/01 1929) Resp:  [18-24] 20 (10/02 0352) BP: (120-163)/(74-84) 120/82 (10/02 0352) SpO2:  [94 %-98 %] 98 % (10/02 0352) Weight:  [147.2 kg] 147.2 kg (10/02 0352)  Hemodynamic parameters for last 24 hours:    Intake/Output from previous day: 10/01 0701 - 10/02 0700 In: 1725.5 [NG/GT:1725.5] Out: 1895 [Urine:1700; Emesis/NG output:100; Drains:15; Chest Tube:80] Intake/Output this shift: No intake/output data recorded.  General appearance: alert, cooperative, and no distress Heart: regular rate and rhythm Lungs: clear to auscultation bilaterally Abdomen: obese, benign Extremities: no edema Wound: incis healing well  Lab Results: Recent Labs    04/07/22 0756 04/09/22 0440  WBC 8.9 8.2  HGB 9.5* 9.5*  HCT 28.2* 29.2*  PLT 170 196   BMET:  Recent Labs    04/07/22 0756 04/09/22 0440  NA 137 145  K 3.5 3.8  CL 106 108  CO2 27 26  GLUCOSE 139* 117*  BUN 17 25*  CREATININE 1.15 1.03  CALCIUM 11.8* 11.8*    PT/INR: No results for input(s): "LABPROT", "INR" in the last 72 hours. ABG    Component Value Date/Time   PHART 7.41 04/06/2022 0332   HCO3 24.1 04/06/2022 0332   TCO2 28 11/13/2021 2122   ACIDBASEDEF 0.3 04/06/2022 0332   O2SAT 98 04/06/2022 0332   CBG (last 3)  Recent Labs    04/09/22 1455 04/09/22 1920 04/10/22 0000  GLUCAP 131* 128* 145*    Meds Scheduled Meds:   Chlorhexidine Gluconate Cloth  6 each Topical Daily   enoxaparin (LOVENOX) injection  75 mg Subcutaneous Q24H   feeding supplement (PROSource TF20)  60 mL Per Tube Daily   free water  200 mL Per Tube Q4H   insulin aspart  0-24 Units Subcutaneous Q4H   olopatadine  1 drop Both Eyes BID   pantoprazole (PROTONIX) IV  40 mg Intravenous Q12H   Continuous Infusions:  feeding supplement (OSMOLITE 1.5 CAL) 1,000 mL (04/09/22 1527)   PRN Meds:.hydrALAZINE, HYDROcodone-acetaminophen, morphine injection, ondansetron (ZOFRAN) IV  Xrays DG Chest Port 1 View  Result Date: 04/09/2022 CLINICAL DATA:  Follow-up exam.  Esophagectomy. EXAM: PORTABLE CHEST 1 VIEW COMPARISON:  04/07/2022 and older exams. FINDINGS: Basilar atelectasis, unchanged from the previous exam. Remainder of the lungs is clear. No convincing pleural effusion.  No pneumothorax. Nasal/orogastric tube, right internal jugular central venous lines and right-sided chest tube are stable. IMPRESSION: 1. No significant change from the most recent prior exam. 2. Mild persistent lung base atelectasis. 3. Support apparatus is stable in well positioned. 4. No pneumothorax. Electronically Signed   By: Lajean Manes M.D.   On: 04/09/2022 08:39    Assessment/Plan: S/P Procedure(s) (LRB): XI ROBOTIC ASSISTED IVOR LEWIS ESOPHAGECTOMY (N/A) XI ROBOTIC ASSISTED JEJUNOSTOMY TUBE PLACEMENT (N/A) ESOPHAGOGASTRODUODENOSCOPY (EGD) (N/A) NODE DISSECTION INTERCOSTAL NERVE BLOCK POD#5  1 Tmax 99.3, S BP 120's-160's, sinus w/1 deg AVB, sinus tachy- hydrazine  till po meds for HTN can be started 2 sats good on RA 3 good UOP 4 NGT 100 cc/24 h 5 bulb- 15 cc/24h 6 CT 80 cc/24 h 6 no new labs 7 swallow study today 8 CBG ok on SSI till po meds can be started 9 routine rehab/pulm hygiene   LOS: 5 days    [name] PA-C Pager [phone] 04/10/2022   Agree with above Swallow shows no leak Starting dysph 1 diet Will remove chest tube and keep  [place]

## 2022-04-11 ENCOUNTER — Inpatient Hospital Stay (HOSPITAL_COMMUNITY): Payer: No Typology Code available for payment source

## 2022-04-11 LAB — CBC WITH DIFFERENTIAL/PLATELET
Abs Immature Granulocytes: 0.06 10*3/uL (ref 0.00–0.07)
Basophils Absolute: 0 10*3/uL (ref 0.0–0.1)
Basophils Relative: 0 %
Eosinophils Absolute: 0.1 10*3/uL (ref 0.0–0.5)
Eosinophils Relative: 1 %
HCT: 24.5 % — ABNORMAL LOW (ref 39.0–52.0)
Hemoglobin: 8 g/dL — ABNORMAL LOW (ref 13.0–17.0)
Immature Granulocytes: 1 %
Lymphocytes Relative: 3 %
Lymphs Abs: 0.3 10*3/uL — ABNORMAL LOW (ref 0.7–4.0)
MCH: 31.6 pg (ref 26.0–34.0)
MCHC: 32.7 g/dL (ref 30.0–36.0)
MCV: 96.8 fL (ref 80.0–100.0)
Monocytes Absolute: 0.6 10*3/uL (ref 0.1–1.0)
Monocytes Relative: 6 %
Neutro Abs: 9.2 10*3/uL — ABNORMAL HIGH (ref 1.7–7.7)
Neutrophils Relative %: 89 %
Platelets: 150 10*3/uL (ref 150–400)
RBC: 2.53 MIL/uL — ABNORMAL LOW (ref 4.22–5.81)
RDW: 13.8 % (ref 11.5–15.5)
WBC: 10.2 10*3/uL (ref 4.0–10.5)
nRBC: 0 % (ref 0.0–0.2)

## 2022-04-11 LAB — BASIC METABOLIC PANEL
Anion gap: 7 (ref 5–15)
BUN: 24 mg/dL — ABNORMAL HIGH (ref 8–23)
CO2: 25 mmol/L (ref 22–32)
Calcium: 10.3 mg/dL (ref 8.9–10.3)
Chloride: 102 mmol/L (ref 98–111)
Creatinine, Ser: 1.04 mg/dL (ref 0.61–1.24)
GFR, Estimated: >60 mL/min (ref >60)
Glucose, Bld: 227 mg/dL — ABNORMAL HIGH (ref 70–99)
Potassium: 4.1 mmol/L (ref 3.5–5.1)
Sodium: 134 mmol/L — ABNORMAL LOW (ref 135–145)

## 2022-04-11 LAB — GLUCOSE, CAPILLARY
Glucose-Capillary: 160 mg/dL — ABNORMAL HIGH (ref 70–99)
Glucose-Capillary: 187 mg/dL — ABNORMAL HIGH (ref 70–99)
Glucose-Capillary: 148 mg/dL — ABNORMAL HIGH (ref 70–99)
Glucose-Capillary: 138 mg/dL — ABNORMAL HIGH (ref 70–99)
Glucose-Capillary: 195 mg/dL — ABNORMAL HIGH (ref 70–99)
Glucose-Capillary: 203 mg/dL — ABNORMAL HIGH (ref 70–99)

## 2022-04-11 MED ORDER — DOCUSATE SODIUM 50 MG/5ML PO LIQD
100.0000 mg | Freq: Two times a day (BID) | ORAL | Status: DC
  Administered 2022-04-11 – 2022-04-17 (×7): 100 mg via ORAL
  Filled 2022-04-11 (×12): qty 10

## 2022-04-11 NOTE — Progress Notes (Signed)
   04/10/22 2321  Assess: MEWS Score  Temp (!) 101.5 F (38.6 C)  BP (!) 147/73  MAP (mmHg) 95  Pulse Rate 100  ECG Heart Rate (!) 103  Resp (!) 24  SpO2 96 %  O2 Device Nasal Cannula  O2 Flow Rate (L/min) 2 L/min  Assess: MEWS Score  MEWS Temp 2  MEWS Systolic 0  MEWS Pulse 1  MEWS RR 1  MEWS LOC 0  MEWS Score 4  MEWS Score Color Red  Assess: if the MEWS score is Yellow or Red  Were vital signs taken at a resting state? Yes  Focused Assessment Change from prior assessment (see assessment flowsheet)  Does the patient meet 2 or more of the SIRS criteria? Yes  Does the patient have a confirmed or suspected source of infection? No  MEWS guidelines implemented *See Row Information* Yes  Treat  MEWS Interventions Administered prn meds/treatments  Pain Scale 0-10  Pain Score 0  Take Vital Signs  Increase Vital Sign Frequency  Red: Q 1hr X 4 then Q 4hr X 4, if remains red, continue Q 4hrs  Escalate  MEWS: Escalate Red: discuss with charge nurse/RN and provider, consider discussing with RRT  Notify: Charge Nurse/RN  Name of Charge Nurse/RN Notified Tanya, RN  Date Charge Nurse/RN Notified 04/10/22  Time Charge Nurse/RN Notified 2330  Notify: Provider  Provider Name/Title Dr.Vantrigt  Date Provider Notified 04/10/22  Time Provider Notified 2335  Method of Notification Page;Call  Notification Reason Change in status  Provider response See new orders  Date of Provider Response 04/10/22  Time of Provider Response 2335  Document  Patient Outcome Other (Comment)  Progress note created (see row info) Yes  Assess: SIRS CRITERIA  SIRS Temperature  1  SIRS Pulse 1  SIRS Respirations  1  SIRS WBC 1  SIRS Score Sum  4   Pt with elevated new TR of 101.5.  Dr. Darcey Nora notified.  Verbal orders given for 1g Tylenol q6h PRN and CXR in the morning. Pt continues to be alert and oriented. Denies pain and just stating he feels tired.

## 2022-04-11 NOTE — Progress Notes (Signed)
PT Cancellation Note  Patient Details Name: Keith Moreno MRN: [id number] DOB: 04/13/1961   Cancelled Treatment:    Reason Eval/Treat Not Completed: Patient declined, no reason specified (pt reports fatigue from lack of sleep and despite education and encouragement denied mobility at this time)   [name] 04/11/2022, 9:02 AM [name], [place] Office: [phone]

## 2022-04-11 NOTE — Progress Notes (Addendum)
[address]       [place] [address]             [phone]      6 Days Post-Op Procedure(s) (LRB): XI ROBOTIC ASSISTED IVOR LEWIS ESOPHAGECTOMY (N/A) XI ROBOTIC ASSISTED JEJUNOSTOMY TUBE PLACEMENT (N/A) ESOPHAGOGASTRODUODENOSCOPY (EGD) (N/A) NODE DISSECTION INTERCOSTAL NERVE BLOCK Subjective: Very thirsty, wife concerned he drank too much water - discussed with patient to limit  Objective: Vital signs in last 24 hours: Temp:  [98 F (36.7 C)-101.5 F (38.6 C)] 98.3 F (36.8 C) (10/03 0433) Pulse Rate:  [94-102] 95 (10/03 0433) Cardiac Rhythm: Sinus tachycardia;Bundle branch block (10/02 1900) Resp:  [13-24] 23 (10/03 0433) BP: (108-147)/(64-82) 138/67 (10/03 0433) SpO2:  [93 %-98 %] 97 % (10/03 0433) Weight:  [150.1 kg] 150.1 kg (10/03 0437)  Hemodynamic parameters for last 24 hours:    Intake/Output from previous day: 10/02 0701 - 10/03 0700 In: 4675 [P.O.:600; NG/GT:4075] Out: 3846 [Urine:1650] Intake/Output this shift: No intake/output data recorded.  General appearance: fatigued and no distress Heart: regular rate and rhythm Lungs: clear to auscultation bilaterally Abdomen: benign Extremities: no edema Wound: incis healing well  Lab Results: Recent Labs    04/09/22 0440  WBC 8.2  HGB 9.5*  HCT 29.2*  PLT 196   BMET:  Recent Labs    04/09/22 0440  NA 145  K 3.8  CL 108  CO2 26  GLUCOSE 117*  BUN 25*  CREATININE 1.03  CALCIUM 11.8*    PT/INR: No results for input(s): "LABPROT", "INR" in the last 72 hours. ABG    Component Value Date/Time   PHART 7.41 04/06/2022 0332   HCO3 24.1 04/06/2022 0332   TCO2 28 11/13/2021 2122   ACIDBASEDEF 0.3 04/06/2022 0332   O2SAT 98 04/06/2022 0332   CBG (last 3)  Recent Labs    04/10/22 2034 04/10/22 2318 04/11/22 0432  GLUCAP 174* 154* 160*    Meds Scheduled Meds:  Chlorhexidine Gluconate Cloth  6 each Topical Daily   enoxaparin (LOVENOX) injection  75 mg Subcutaneous  Q24H   feeding supplement (OSMOLITE 1.5 CAL)  1,000 mL Per Tube Q24H   feeding supplement (PROSource TF20)  60 mL Per Tube Daily   free water  200 mL Per Tube Q4H   insulin aspart  0-24 Units Subcutaneous Q4H   olopatadine  1 drop Both Eyes BID   pantoprazole (PROTONIX) IV  40 mg Intravenous Q12H   Continuous Infusions: PRN Meds:.acetaminophen, hydrALAZINE, HYDROcodone-acetaminophen, morphine injection, ondansetron (ZOFRAN) IV  Xrays DG ESOPHAGUS W SINGLE CM (SOL OR THIN BA)  Result Date: 04/10/2022 CLINICAL DATA:  Evaluate for leak.  Esophagectomy. EXAM: ESOPHOGRAM/BARIUM SWALLOW TECHNIQUE: Single contrast examination was performed using thin barium or water soluble. FLUOROSCOPY: Radiation Exposure Index (as provided by the fluoroscopic device): 14.6 mGy Kerma COMPARISON:  None Available. FINDINGS: Postsurgical changes are identified. No leak identified. Motility is within normal limits. IMPRESSION: No leak.  No other abnormalities identified. Electronically Signed   By: Dorise Bullion III M.D.   On: 04/10/2022 10:21    Assessment/Plan: S/P Procedure(s) (LRB): XI ROBOTIC ASSISTED IVOR LEWIS ESOPHAGECTOMY (N/A) XI ROBOTIC ASSISTED JEJUNOSTOMY TUBE PLACEMENT (N/A) ESOPHAGOGASTRODUODENOSCOPY (EGD) (N/A) NODE DISSECTION INTERCOSTAL NERVE BLOCK POD#6  1 Tmax 101.5, VSS s BP 100's-140's 2 sats good on 2 liters 3 good UOP 3 CXR no significant ASD/effus 4 no new labs- will check 5 will check labs, if conts to have fevers will need cultures 6 push rehab  and pulm hygiene 7 on DVT ppx w/lovenox 8 tol D1 diet, discussed w/ him that hehe cant drink too much water     LOS: 6 days    [name] 04/11/2022    Agree with above Swallow good, tolerated diet Increasing TF to goal Needs more PT [place]

## 2022-04-11 NOTE — Evaluation (Signed)
Occupational Therapy Evaluation Patient Details Name: Keith Moreno MRN: [id number] DOB: [birth year] Today's Date: 04/11/2022   History of Present Illness 61 yo male admitted 9/27 for esophagectomy with J tube placement due to esophageal CA. PMhx: chronic low back pain, esophageal CA   Clinical Impression   Keith Moreno was evaluated s/p the above admission list, he is typically indep at baseline and lives with his wife (who recently broke her ribs from a fall). Upon evaluation pt was limited to bed level, refused dure to already walking 2x. Overall he has limitations due to generalized weakness, pain and decreased activity tolerance. Pt completed bed mobility mod I, and groomed in sitting with set up A. Due to deficits listed below he currently required up to mod A for LB ADLs. OT to continue to follow acutely. Recommend d/c to home with HHOT.      Recommendations for follow up therapy are one component of a multi-disciplinary discharge planning process, led by the attending physician.  Recommendations may be updated based on patient status, additional functional criteria and insurance authorization.   Follow Up Recommendations  Home health OT    Assistance Recommended at Discharge Intermittent Supervision/Assistance  Patient can return home with the following A little help with walking and/or transfers;A little help with bathing/dressing/bathroom;Assistance with cooking/housework;Assist for transportation;Help with stairs or ramp for entrance    Functional Status Assessment  Patient has had a recent decline in their functional status and demonstrates the ability to make significant improvements in function in a reasonable and predictable amount of time.  Equipment Recommendations  Other (comment) (bari RW & shower chair)       Precautions / Restrictions Precautions Precautions: Fall;Other (comment) Precaution Comments: JP drain rt flank, PEG Restrictions Weight Bearing Restrictions: No       Mobility Bed Mobility Overal bed mobility: Modified Independent             General bed mobility comments: HOB elevated    Transfers Overall transfer level: Needs assistance                 General transfer comment: Pt declined standing, reports he has already been up 2x with therapy.      Balance Overall balance assessment: Needs assistance Sitting-balance support: Feet supported Sitting balance-Leahy Scale: Good                                     ADL either performed or assessed with clinical judgement   ADL Overall ADL's : Needs assistance/impaired Eating/Feeding: Independent;Sitting Eating/Feeding Details (indicate cue type and reason): dys 1 Grooming: Set up;Sitting Grooming Details (indicate cue type and reason): EOB this date Upper Body Bathing: Set up;Sitting   Lower Body Bathing: Moderate assistance;Sit to/from stand   Upper Body Dressing : Set up;Sitting   Lower Body Dressing: Moderate assistance;Sit to/from stand   Toilet Transfer: Minimal assistance;Ambulation;Rolling walker (2 wheels)   Toileting- Clothing Manipulation and Hygiene: Min guard;Sitting/lateral lean       Functional mobility during ADLs: Minimal assistance;Rolling walker (2 wheels) General ADL Comments: self limiting. generalized weakness and pain, decreased activity tolerance     Vision Baseline Vision/History: 0 No visual deficits Vision Assessment?: No apparent visual deficits            Pertinent Vitals/Pain Pain Assessment Pain Assessment: Faces Faces Pain Scale: Hurts a little bit Pain Location: incisional, back, neck Pain Descriptors / Indicators: Aching Pain Intervention(s): Limited  activity within patient's tolerance, Monitored during session     Hand Dominance Right   Extremity/Trunk Assessment Upper Extremity Assessment Upper Extremity Assessment: Overall WFL for tasks assessed   Lower Extremity Assessment Lower Extremity  Assessment: Defer to PT evaluation   Cervical / Trunk Assessment Cervical / Trunk Assessment: Other exceptions Cervical / Trunk Exceptions: increased body habitus, R JP   Communication Communication Communication: No difficulties   Cognition Arousal/Alertness: Awake/alert Behavior During Therapy: Flat affect Overall Cognitive Status: Within Functional Limits for tasks assessed                                 General Comments: limited insight, slow processing     General Comments  VSS on RA     Home Living Family/patient expects to be discharged to:: Private residence Living Arrangements: Spouse/significant other Available Help at Discharge: Family;Available 24 hours/day Type of Home: House Home Access: Stairs to enter [employer] of Steps: 2 Entrance Stairs-Rails: Right Home Layout: One level     Bathroom Shower/Tub: Tub/shower unit;Walk-in shower   Bathroom Toilet: Standard     Home Equipment: None          Prior Functioning/Environment Prior Level of Function : Independent/Modified Independent             Mobility Comments: no AD ADLs 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        OT Problem List: Decreased strength;Decreased range of motion;Decreased activity tolerance;Impaired balance (sitting and/or standing);Decreased safety awareness;Decreased knowledge of use of DME or AE;Decreased knowledge of precautions;Pain      OT Treatment/Interventions: Self-care/ADL training;Therapeutic exercise;DME and/or AE instruction;Therapeutic activities;Patient/family education;Balance training;Energy conservation    OT Goals(Current goals can be found in the care plan section) Acute Rehab OT Goals Patient Stated Goal: home OT Goal Formulation: With patient Time For Goal Achievement: 04/25/22 Potential to Achieve Goals: Good ADL Goals Pt Will Perform Grooming: with modified independence;standing Pt Will Perform Lower Body  Bathing: with supervision;sit to/from stand Pt Will Perform Lower Body Dressing: with supervision;sit to/from stand Pt Will Transfer to Toilet: ambulating;with modified independence;regular height toilet Additional ADL Goal #1: pt will indep recall at least 2 energy conservation strategies to apply in the home setting  OT Frequency: Min 2X/week       AM-PAC OT "6 Clicks" Daily Activity     Outcome Measure Help from another person eating meals?: None Help from another person taking care of personal grooming?: A Little Help from another person toileting, which includes using toliet, bedpan, or urinal?: A Little Help from another person bathing (including washing, rinsing, drying)?: A Lot Help from another person to put on and taking off regular upper body clothing?: A Little Help from another person to put on and taking off regular lower body clothing?: A Lot 6 Click Score: 17   End of Session Nurse Communication: Mobility status  Activity Tolerance: Patient tolerated treatment well Patient left: in bed;with call bell/phone within reach;with bed alarm set  OT Visit Diagnosis: Unsteadiness on feet (R26.81);Other abnormalities of gait and mobility (R26.89);Muscle weakness (generalized) (M62.81);Pain                Time: 2536-6440 OT Time Calculation (min): 20 min Charges:  OT General Charges $OT Visit: 1 Visit OT Evaluation $OT Eval Moderate Complexity: 1 Mod   Taleeyah Bora D Causey 04/11/2022, 3:50 PM

## 2022-04-11 NOTE — TOC Progression Note (Signed)
Transition of Care [facility]) - Progression Note    Patient Details  Name: Carmichael Burdette MRN: [id number] Date of Birth: 1961-01-16  Transition of Care [facility]) CM/SW [place], RN Phone Number:[phone]  04/11/2022, 12:09 PM  Clinical Narrative:    CM at bedside to update patient on process fro DME from [place]. CM made patient aware that CM would order DME per PT recommendation because CM would need to fax recommendations, orders and notes to get approval. Patient verbalized understanding.        Expected Discharge Plan and Services                                                 Social Determinants of Health (SDOH) Interventions    Readmission Risk Interventions     No data to display

## 2022-04-11 NOTE — Evaluation (Signed)
Physical Therapy Evaluation Patient Details Name: Keith Moreno MRN: [id number] DOB: 02-03-1961 Today's Date: 04/11/2022  History of Present Illness  61 yo male admitted 9/27 for esophagectomy with J tube placement due to esophageal CA. PMhx: chronic low back pain, esophageal CA  Clinical Impression  Pt initially with eyes open then closed them upon therapist arrival. Pt required encouragement to engage and participate with pt able to transition to standing and perform limited gait on RA due to fatigue. Pt normally independent without DME and will benefit from bari rollator at D/C as well as HHPT to maximize independence. PT with decreased strength, transfers, gait and function who will benefit from acute therapy to maximize mobility and safety.         Recommendations for follow up therapy are one component of a multi-disciplinary discharge planning process, led by the attending physician.  Recommendations may be updated based on patient status, additional functional criteria and insurance authorization.  Follow Up Recommendations Home health PT      Assistance Recommended at Discharge Intermittent Supervision/Assistance  Patient can return home with the following  A little help with walking and/or transfers;A little help with bathing/dressing/bathroom;Assistance with cooking/housework;Assist for transportation;Help with stairs or ramp for entrance    Equipment Recommendations Rollator (4 wheels) (bari)  Recommendations for Other Services       Functional Status Assessment Patient has had a recent decline in their functional status and demonstrates the ability to make significant improvements in function in a reasonable and predictable amount of time.     Precautions / Restrictions Precautions Precautions: Fall;Other (comment) Precaution Comments: JP drain rt flank, PEG      Mobility  Bed Mobility Overal bed mobility: Modified Independent             General bed  mobility comments: increased time with use of rail and HOB 30 degrees    Transfers Overall transfer level: Needs assistance   Transfers: Sit to/from Stand Sit to Stand: Min assist, From elevated surface           General transfer comment: bed elevated grossly 4 inches for pt to be able to rise with min assist and cues for hand placement    Ambulation/Gait Ambulation/Gait assistance: Min guard Gait Distance (Feet): 40 Feet Assistive device: Rolling walker (2 wheels) Gait Pattern/deviations: Step-through pattern, Decreased stride length, Trunk flexed   Gait velocity interpretation: <1.8 ft/sec, indicate of risk for recurrent falls   General Gait Details: pt denied further gait due to fatigue with reliance on RW and SpO2 >90% on RA throughout  Stairs            Wheelchair Mobility    Modified Rankin (Stroke Patients Only)       Balance Overall balance assessment: Needs assistance   Sitting balance-Leahy Scale: Good     Standing balance support: Bilateral upper extremity supported, Reliant on assistive device for balance Standing balance-Leahy Scale: Poor Standing balance comment: RW in standing                             Pertinent Vitals/Pain Pain Assessment Pain Assessment: 0-10 Pain Score: 4  Pain Location: incisional, back, neck Pain Descriptors / Indicators: Aching Pain Intervention(s): Limited activity within patient's tolerance, Repositioned, Monitored during session    Home Living Family/patient expects to be discharged to:: Private residence Living Arrangements: Spouse/significant other Available Help at Discharge: Family;Available 24 hours/day Type of Home: House Home Access: Stairs to enter  Entrance Stairs-Number of Steps: 2   Home Layout: One level Home Equipment: None      Prior Function Prior Level of Function : Independent/Modified Independent                     Hand Dominance        Extremity/Trunk  Assessment   Upper Extremity Assessment Upper Extremity Assessment: Generalized weakness    Lower Extremity Assessment Lower Extremity Assessment: Generalized weakness    Cervical / Trunk Assessment Cervical / Trunk Assessment: Kyphotic  Communication   Communication: No difficulties  Cognition Arousal/Alertness: Awake/alert Behavior During Therapy: Flat affect Overall Cognitive Status: Within Functional Limits for tasks assessed                                          General Comments      Exercises     Assessment/Plan    PT Assessment Patient needs continued PT services  PT Problem List Decreased strength;Decreased mobility;Decreased activity tolerance;Decreased balance;Decreased knowledge of use of DME;Pain       PT Treatment Interventions Gait training;Stair training;Functional mobility training;Therapeutic activities;Patient/family education;DME instruction;Therapeutic exercise    PT Goals (Current goals can be found in the Care Plan section)  Acute Rehab PT Goals Patient Stated Goal: return home PT Goal Formulation: With patient Time For Goal Achievement: 04/25/22 Potential to Achieve Goals: Good    Frequency Min 3X/week     Co-evaluation               AM-PAC PT "6 Clicks" Mobility  Outcome Measure Help needed turning from your back to your side while in a flat bed without using bedrails?: A Little Help needed moving from lying on your back to sitting on the side of a flat bed without using bedrails?: A Little Help needed moving to and from a bed to a chair (including a wheelchair)?: A Little Help needed standing up from a chair using your arms (e.g., wheelchair or bedside chair)?: A Little Help needed to walk in hospital room?: A Little Help needed climbing 3-5 steps with a railing? : A Lot 6 Click Score: 17    End of Session   Activity Tolerance: Patient tolerated treatment well Patient left: in chair;with call bell/phone  within reach Nurse Communication: Mobility status PT Visit Diagnosis: Other abnormalities of gait and mobility (R26.89);Difficulty in walking, not elsewhere classified (R26.2);Muscle weakness (generalized) (M62.81)    Time: 1041-1110 PT Time Calculation (min) (ACUTE ONLY): 29 min   Charges:   PT Evaluation $PT Eval Moderate Complexity: 1 Mod PT Treatments $Therapeutic Activity: 8-22 mins        Merryl Hacker, PT Acute Rehabilitation Services Office: [phone]   Enedina Finner Naylee Frankowski 04/11/2022, 11:17 AM

## 2022-04-11 NOTE — Plan of Care (Signed)

## 2022-04-11 NOTE — Progress Notes (Addendum)
Mobility Specialist Progress Note    04/11/22 1435  Mobility  Activity Ambulated with assistance in hallway  Activity Response Tolerated fair  Distance Ambulated (ft) 120 ft (60+60)  $Mobility charge 1 Mobility  Level of Assistance Moderate assist, patient does 50-74%  Assistive Device Four wheel walker   Pre-Mobility: 93 HR, 93% SpO2 During Mobility: 123 HR Post-Mobility: 102 HR, 93% SpO2  Pt received in bed and agreeable. ModA for bed mobility. C/o pain. Took x1 extended seated rest break. Returned to bed with RN present.   Hildred Alamin Mobility Specialist

## 2022-04-12 LAB — GLUCOSE, CAPILLARY
Glucose-Capillary: 139 mg/dL — ABNORMAL HIGH (ref 70–99)
Glucose-Capillary: 168 mg/dL — ABNORMAL HIGH (ref 70–99)
Glucose-Capillary: 170 mg/dL — ABNORMAL HIGH (ref 70–99)
Glucose-Capillary: 181 mg/dL — ABNORMAL HIGH (ref 70–99)
Glucose-Capillary: 189 mg/dL — ABNORMAL HIGH (ref 70–99)
Glucose-Capillary: 195 mg/dL — ABNORMAL HIGH (ref 70–99)
Glucose-Capillary: 208 mg/dL — ABNORMAL HIGH (ref 70–99)

## 2022-04-12 LAB — 25-HYDROXY VITAMIN D LCMS D2+D3
25-Hydroxy, Vitamin D-2: <1 ng/mL
25-Hydroxy, Vitamin D-3: 213 ng/mL
25-Hydroxy, Vitamin D: 213 ng/mL — ABNORMAL HIGH

## 2022-04-12 MED ORDER — ENSURE ENLIVE PO LIQD
237.0000 mL | Freq: Two times a day (BID) | ORAL | Status: DC
  Administered 2022-04-13: 237 mL via ORAL

## 2022-04-12 MED ORDER — ENSURE ENLIVE PO LIQD: 237.0000 mL | Freq: Two times a day (BID) | ORAL | Status: DC

## 2022-04-12 NOTE — Plan of Care (Signed)
  Problem: Education: Goal: Knowledge of General Education information will improve Description: Including pain rating scale, medication(s)/side effects and non-pharmacologic comfort measures Outcome: Progressing   Problem: Health Behavior/Discharge Planning: Goal: Ability to manage health-related needs will improve Outcome: Progressing   Problem: Clinical Measurements: Goal: Ability to maintain clinical measurements within normal limits will improve Outcome: Progressing Goal: Will remain free from infection Outcome: Progressing Goal: Diagnostic test results will improve Outcome: Progressing Goal: Respiratory complications will improve Outcome: Progressing Goal: Cardiovascular complication will be avoided Outcome: Progressing   Problem: Activity: Goal: Risk for activity intolerance will decrease Outcome: Progressing   Problem: Nutrition: Goal: Adequate nutrition will be maintained Outcome: Progressing   Problem: Coping: Goal: Level of anxiety will decrease Outcome: Progressing   Problem: Elimination: Goal: Will not experience complications related to bowel motility Outcome: Progressing Goal: Will not experience complications related to urinary retention Outcome: Progressing   Problem: Pain Managment: Goal: General experience of comfort will improve Outcome: Progressing   Problem: Safety: Goal: Ability to remain free from injury will improve Outcome: Progressing   Problem: Skin Integrity: Goal: Risk for impaired skin integrity will decrease Outcome: Progressing   Problem: Education: Goal: Knowledge of the prescribed therapeutic regimen will improve Outcome: Progressing   Problem: Bowel/Gastric: Goal: Gastrointestinal status for postoperative course will improve Outcome: Progressing   Problem: Nutritional: Goal: Ability to achieve adequate nutritional intake will improve Outcome: Progressing   Problem: Clinical Measurements: Goal: Postoperative  complications will be avoided or minimized Outcome: Progressing   Problem: Respiratory: Goal: Ability to maintain a clear airway will improve Outcome: Progressing   Problem: Skin Integrity: Goal: Demonstration of wound healing without infection will improve Outcome: Progressing   Problem: Safety: Goal: Non-violent Restraint(s) Outcome: Progressing

## 2022-04-12 NOTE — TOC Progression Note (Addendum)
Transition of Care [facility]) - Progression Note    Patient Details  Name: Cong Hightower MRN: [id number] Date of Birth: [birth year]  Transition of Care [facility]) CM/SW [place], RN Phone Number:[phone]  04/12/2022, 3:49 PM  Clinical Narrative:    CM at bedside to make patient aware the DME will be ordered per [facility]. Patient verbalized understanding. CM offered patient choice for home health services. Patient has no preference. Home health referral has been accepted by [facility]. Home Health orders have been emailed to [place] to initiate auth.  CM made aware per [name] that MD will change tube feed would like to try cycling it over 12 hours. Orders to entered tomorrow. CM will follow up in order to submit  Tube feed orders to [place] [facility]ME ( Rollator, Shower chair & hospital bed) orders with supporting notes have been faxed to the [place] @ [phone]. This process is expected to take 3 business days process.        Expected Discharge Plan and Services                                                 Social Determinants of Health (SDOH) Interventions    Readmission Risk Interventions     No data to display

## 2022-04-12 NOTE — TOC Progression Note (Signed)
Transition of Care [facility]) - Progression Note    Patient Details  Name: Muaaz Brau MRN: [id number] Date of Birth: [birth year]  Transition [facility]) CM/SW [place], RN Phone Number:[phone]  04/12/2022, 3:07 PM  Clinical Narrative:       Durable Medical Equipment  (From admission, onward)           Start     Ordered   04/12/22 1505  For home use only DME Hospital bed  Once       Question Answer Comment  Length of Need 6 Months   The above medical condition requires: Patient requires the ability to reposition frequently   Head must be elevated greater than: 30 degrees   Bed type Semi-electric   Support Surface: Gel Overlay      04/12/22 1507   04/12/22 1452  For home use only DME 4 wheeled rolling walker with seat  Once       Question Answer Comment  Patient needs a walker to treat with the following condition Esophageal adenocarcinoma ([place])   Patient needs a walker to treat with the following condition Weakness      04/12/22 1451   04/12/22 1451  For home use only DME Shower stool  Once        04/12/22 1450                     Expected Discharge Plan and Services                                                 Social Determinants of Health (SDOH) Interventions    Readmission Risk Interventions     No data to display

## 2022-04-12 NOTE — Progress Notes (Signed)
Mobility Specialist Progress Note    04/12/22 1056  Mobility  Activity Ambulated with assistance in hallway  Activity Response Tolerated fair  Distance Ambulated (ft) 185 ft (90+95)  $Mobility charge 1 Mobility  Level of Assistance Minimal assist, patient does 75% or more  Assistive Device Four wheel walker   Pre-Mobility: 86 HR, 119/72 BP, 92% SpO2 During Mobility: 113 HR, 98/75 BP, 93% SpO2 Post-Mobility: 99 HR, 120/78 BP, 93% SpO2  Pt received in bed and agreeable. Sat EOB ~10 minutes for dressing change and bath. Upon standing, c/o some dizziness and took a seated rest break. BP 119/72. Took x1 extended seated rest break during, c/o dizziness. BP 98/75. Encouraged pursed lip breathing. Returned to chair with call bell in reach.   Hildred Alamin Mobility Specialist

## 2022-04-12 NOTE — Progress Notes (Addendum)
[address]       [place] [address]             [phone]      7 Days Post-Op Procedure(s) (LRB): XI ROBOTIC ASSISTED IVOR LEWIS ESOPHAGECTOMY (N/A) XI ROBOTIC ASSISTED JEJUNOSTOMY TUBE PLACEMENT (N/A) ESOPHAGOGASTRODUODENOSCOPY (EGD) (N/A) NODE DISSECTION INTERCOSTAL NERVE BLOCK Subjective: Feels fair, more active yesterday, working with PT/OT, tolerating diet in small amts  Objective: Vital signs in last 24 hours: Temp:  [98.3 F (36.8 C)-99.7 F (37.6 C)] 98.7 F (37.1 C) (10/04 0429) Pulse Rate:  [83-95] 92 (10/04 0429) Cardiac Rhythm: Normal sinus rhythm;Bundle branch block (10/03 1900) Resp:  [19-31] 19 (10/04 0429) BP: (111-143)/(58-92) 111/58 (10/04 0429) SpO2:  [93 %-95 %] 93 % (10/04 0017) Weight:  [150.5 kg] 150.5 kg (10/04 0500)  Hemodynamic parameters for last 24 hours:    Intake/Output from previous day: 10/03 0701 - 10/04 0700 In: 680 [P.O.:480] Out: 550 [Urine:500; Drains:50] Intake/Output this shift: No intake/output data recorded.  General appearance: alert, cooperative, and no distress Heart: regular rate and rhythm Lungs: min dim in bases Abdomen: obese, benign Extremities: minor edema Wound: incis healing well  Lab Results: Recent Labs    04/11/22 0900  WBC 10.2  HGB 8.0*  HCT 24.5*  PLT 150   BMET:  Recent Labs    04/11/22 0900  NA 134*  K 4.1  CL 102  CO2 25  GLUCOSE 227*  BUN 24*  CREATININE 1.04  CALCIUM 10.3    PT/INR: No results for input(s): "LABPROT", "INR" in the last 72 hours. ABG    Component Value Date/Time   PHART 7.41 04/06/2022 0332   HCO3 24.1 04/06/2022 0332   TCO2 28 11/13/2021 2122   ACIDBASEDEF 0.3 04/06/2022 0332   O2SAT 98 04/06/2022 0332   CBG (last 3)  Recent Labs    04/11/22 1928 04/11/22 2147 04/12/22 0054  GLUCAP 195* 203* 170*    Meds Scheduled Meds:  Chlorhexidine Gluconate Cloth  6 each Topical Daily   docusate  100 mg Oral BID   enoxaparin (LOVENOX)  injection  75 mg Subcutaneous Q24H   feeding supplement (OSMOLITE 1.5 CAL)  1,000 mL Per Tube Q24H   feeding supplement (PROSource TF20)  60 mL Per Tube Daily   free water  200 mL Per Tube Q4H   insulin aspart  0-24 Units Subcutaneous Q4H   olopatadine  1 drop Both Eyes BID   pantoprazole (PROTONIX) IV  40 mg Intravenous Q12H   Continuous Infusions: PRN Meds:.acetaminophen, hydrALAZINE, HYDROcodone-acetaminophen, morphine injection, ondansetron (ZOFRAN) IV  Xrays DG CHEST PORT 1 VIEW  Result Date: 04/11/2022 CLINICAL DATA:  Fever, hx esophagectomy EXAM: PORTABLE CHEST - 1 VIEW COMPARISON:  04/09/2022 FINDINGS: Relatively low lung volumes with some patchy atelectasis/consolidation in both lung bases. Right hilar mass is again noted. Stable right IJ port catheter to the SVC. The right IJ central venous catheter appears kinked near the skin entry site, tip in the proximal SVC. Heart size upper limits normal for technique. Mass abutting the aortic arch as before. There is blunting of the left lateral costophrenic angle suggesting possible small effusion, stable. Visualized bones unremarkable. IMPRESSION: 1. Low volumes with bibasilar atelectasis/consolidation. 2. 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 3. Kinked right IJ central venous catheter. Electronically Signed   By: Lucrezia Europe M.D.   On: 04/11/2022 08:49   DG ESOPHAGUS W SINGLE CM (SOL OR THIN BA)  Result Date: 04/10/2022 CLINICAL  DATA:  Evaluate for leak.  Esophagectomy. EXAM: ESOPHOGRAM/BARIUM SWALLOW TECHNIQUE: Single contrast examination was performed using thin barium or water soluble. FLUOROSCOPY: Radiation Exposure Index (as provided by the fluoroscopic device): 14.6 mGy Kerma COMPARISON:  None Available. FINDINGS: Postsurgical changes are identified. No leak identified. Motility is within normal limits. IMPRESSION: No leak.  No other abnormalities identified. Electronically Signed   By: Dorise Bullion III M.D.   On: 04/10/2022  10:21    Assessment/Plan: S/P Procedure(s) (LRB): XI ROBOTIC ASSISTED IVOR LEWIS ESOPHAGECTOMY (N/A) XI ROBOTIC ASSISTED JEJUNOSTOMY TUBE PLACEMENT (N/A) ESOPHAGOGASTRODUODENOSCOPY (EGD) (N/A) NODE DISSECTION INTERCOSTAL NERVE BLOCK POD#7  1 Tmax 99.7, VSS,  O2 sats good on RA, sinus with 1 deg AVB,PVC's,  BP control is adeq 2 voiding 3 drain 50 cc/24 h 4 cbg fair control, cont SSi till can take home meds 5 WBC count normal but does have increased neutrophils on diff, or further significant fevers- moitor clinically 6 H/H has shown a steady decline 8/24.5- monitor clinically, repeat in am- is on lovenox for DVT ppx per pharm dosing 7 PT/OT evals done - will arrange for HHRN/PT/OT, walker, hospital bed if [facility]   LOS: 7 days    [name] PA-C Pager [phone] 04/12/2022    Agree with above Doing well [place]

## 2022-04-12 NOTE — Progress Notes (Signed)
Mobility Specialist Progress Note    04/12/22 1606  Mobility  Activity Refused mobility   Pt c/o migraine and stated "they don't want me to take ibuprofen". Will f/u as schedule permits.   Hildred Alamin Mobility Specialist

## 2022-04-12 NOTE — Progress Notes (Signed)
Brief Nutrition Note  Discussed pt with PA and MD. Will plan to transition pt to 12-hour nocturnal tube feeds tomorrow from 8PM to 8AM. Will plan to meet >/=80% of pt's estimated needs via nocturnal tube feeds as pt is tolerating his diet well. Pt to receive 16-hour nocturnal tube feeds tonight. Discussed with RN.  Pt without a BM since admission. Noted scheduled colace added 04/11/22. Pt reports passing flatus.  Discussed the above with pt and wife at bedside. Pt does not like the pureed diet but is eating what he can. Meal completions have been 10-20%. Pt willing to drink oral nutrition supplements. Will order Ensure Enlive BID between meals and increase to TID between meals once nocturnal tube feeding regimen is adjusted. Pt receives [employer] with meal trays. RD also took pt's preferences and made pt "with assist" so that he can receive help with meal ordering rather than receiving a tray.  RD to follow up tomorrow, 04/13/22.   Gustavus Bryant, MS, RD, LDN Inpatient Clinical Dietitian Please see [facility] for contact information.

## 2022-04-13 ENCOUNTER — Inpatient Hospital Stay (HOSPITAL_COMMUNITY): Payer: No Typology Code available for payment source

## 2022-04-13 LAB — GLUCOSE, CAPILLARY
Glucose-Capillary: 116 mg/dL — ABNORMAL HIGH (ref 70–99)
Glucose-Capillary: 120 mg/dL — ABNORMAL HIGH (ref 70–99)
Glucose-Capillary: 145 mg/dL — ABNORMAL HIGH (ref 70–99)
Glucose-Capillary: 180 mg/dL — ABNORMAL HIGH (ref 70–99)
Glucose-Capillary: 196 mg/dL — ABNORMAL HIGH (ref 70–99)
Glucose-Capillary: 206 mg/dL — ABNORMAL HIGH (ref 70–99)
Glucose-Capillary: 243 mg/dL — ABNORMAL HIGH (ref 70–99)

## 2022-04-13 LAB — CBC
HCT: 23.8 % — ABNORMAL LOW (ref 39.0–52.0)
Hemoglobin: 7.7 g/dL — ABNORMAL LOW (ref 13.0–17.0)
MCH: 30.8 pg (ref 26.0–34.0)
MCHC: 32.4 g/dL (ref 30.0–36.0)
MCV: 95.2 fL (ref 80.0–100.0)
Platelets: 165 10*3/uL (ref 150–400)
RBC: 2.5 MIL/uL — ABNORMAL LOW (ref 4.22–5.81)
RDW: 13.3 % (ref 11.5–15.5)
WBC: 7.2 10*3/uL (ref 4.0–10.5)
nRBC: 0 % (ref 0.0–0.2)

## 2022-04-13 MED ORDER — PROSOURCE TF20 ENFIT COMPATIBL EN LIQD
60.0000 mL | Freq: Two times a day (BID) | ENTERAL | Status: DC
  Administered 2022-04-13 – 2022-04-26 (×25): 60 mL
  Filled 2022-04-13 (×26): qty 60

## 2022-04-13 MED ORDER — GERHARDT'S BUTT CREAM
TOPICAL_CREAM | Freq: Two times a day (BID) | CUTANEOUS | Status: DC
  Administered 2022-04-13: 1 via TOPICAL
  Filled 2022-04-13 (×3): qty 1

## 2022-04-13 MED ORDER — OSMOLITE 1.5 CAL PO LIQD
1000.0000 mL | ORAL | Status: DC
  Administered 2022-04-13 – 2022-04-23 (×13): 1000 mL
  Filled 2022-04-13 (×3): qty 1000

## 2022-04-13 MED ORDER — ENSURE ENLIVE PO LIQD
237.0000 mL | Freq: Three times a day (TID) | ORAL | Status: DC
  Administered 2022-04-13 – 2022-04-18 (×5): 237 mL via ORAL

## 2022-04-13 NOTE — TOC Progression Note (Signed)
Transition of Care [facility]) - Progression Note    Patient Details  Name: Keith Moreno MRN: [id number] Date of Birth: [birth year]  Transition of Care [facility] CM/SW [place], RN Phone Number:[phone]  04/13/2022, 2:04 PM  Clinical Narrative:    Updated tube feed orders & dietician notes have been emailed to [employer]  who is the dietician at the [place]. CM was instructed to email theses orders to the dietician per Harriet Pho at the [facility].         Expected Discharge Plan and Services                                                 Social Determinants of Health (SDOH) Interventions    Readmission Risk Interventions     No data to display

## 2022-04-13 NOTE — Progress Notes (Addendum)
[address]       [place] [address]             [phone]      8 Days Post-Op Procedure(s) (LRB): XI ROBOTIC ASSISTED IVOR LEWIS ESOPHAGECTOMY (N/A) XI ROBOTIC ASSISTED JEJUNOSTOMY TUBE PLACEMENT (N/A) ESOPHAGOGASTRODUODENOSCOPY (EGD) (N/A) NODE DISSECTION INTERCOSTAL NERVE BLOCK Subjective: Feels fairly well, ambulating but tires easily  Objective: Vital signs in last 24 hours: Temp:  [98.5 F (36.9 C)-98.7 F (37.1 C)] 98.7 F (37.1 C) (10/05 0448) Pulse Rate:  [75-91] 86 (10/04 2334) Cardiac Rhythm: Normal sinus rhythm;Bundle branch block;Heart block (10/05 0441) Resp:  [19-26] 24 (10/05 0448) BP: (103-140)/(51-92) 132/73 (10/05 0448) SpO2:  [91 %-95 %] 94 % (10/04 2334) Weight:  [151.8 kg] 151.8 kg (10/05 0500)  Hemodynamic parameters for last 24 hours:    Intake/Output from previous day: 10/04 0701 - 10/05 0700 In: 480 [P.O.:480] Out: 1240 [Urine:1200; Drains:40] Intake/Output this shift: No intake/output data recorded.  General appearance: alert, cooperative, distracted, fatigued, and no distress Heart: regular rate and rhythm Lungs: clear to auscultation bilaterally Abdomen: Benign, positive bowel sounds, Extremities: Minor edema, no calf tenderness Wound: Purulent drainage around J-tube  Lab Results: Recent Labs    04/11/22 0900 04/13/22 0507  WBC 10.2 7.2  HGB 8.0* 7.7*  HCT 24.5* 23.8*  PLT 150 165   BMET:  Recent Labs    04/11/22 0900  NA 134*  K 4.1  CL 102  CO2 25  GLUCOSE 227*  BUN 24*  CREATININE 1.04  CALCIUM 10.3    PT/INR: No results for input(s): "LABPROT", "INR" in the last 72 hours. ABG    Component Value Date/Time   PHART 7.41 04/06/2022 0332   HCO3 24.1 04/06/2022 0332   TCO2 28 11/13/2021 2122   ACIDBASEDEF 0.3 04/06/2022 0332   O2SAT 98 04/06/2022 0332   CBG (last 3)  Recent Labs    04/12/22 2121 04/12/22 2358 04/13/22 0449  GLUCAP 195* 243* 206*    Meds Scheduled Meds:   Chlorhexidine Gluconate Cloth  6 each Topical Daily   docusate  100 mg Oral BID   enoxaparin (LOVENOX) injection  75 mg Subcutaneous Q24H   feeding supplement  237 mL Oral BID BM   feeding supplement (OSMOLITE 1.5 CAL)  1,000 mL Per Tube Q24H   feeding supplement (PROSource TF20)  60 mL Per Tube Daily   free water  200 mL Per Tube Q4H   insulin aspart  0-24 Units Subcutaneous Q4H   olopatadine  1 drop Both Eyes BID   pantoprazole (PROTONIX) IV  40 mg Intravenous Q12H   Continuous Infusions: PRN Meds:.acetaminophen, hydrALAZINE, HYDROcodone-acetaminophen, morphine injection, ondansetron (ZOFRAN) IV  Xrays No results found.  Assessment/Plan: S/P Procedure(s) (LRB): XI ROBOTIC ASSISTED IVOR LEWIS ESOPHAGECTOMY (N/A) XI ROBOTIC ASSISTED JEJUNOSTOMY TUBE PLACEMENT (N/A) ESOPHAGOGASTRODUODENOSCOPY (EGD) (N/A) NODE DISSECTION INTERCOSTAL NERVE BLOCK Postop day #8  1 afebrile, vital signs stable, sinus rhythm with first-degree block 2 oxygen saturations remain okay on room air 3 H/H 7.7/23.8-anemia has shown a slow continuous progression, approaching transfusion threshold, difficulty use iron supplement with dysphagia 1 diet.  Uncertain if this could potentially be active bleeding 4 no leukocytosis 5 no new chest x-ray for today 6 draining purulent somewhat foul-smelling drainage around the J-tube now, does not appear to be tube feedings.  Nursing is to change dressing frequently.  Will culture 7 continue therapies and rehab, continue current tube feeding plan.     LOS: 8  days    Keith Moreno 04/13/2022    Agree with above [place]

## 2022-04-13 NOTE — Progress Notes (Signed)
Pt peg tube with copious amount of gastric drainage around peg site. Drainage has required dressing changes 3x a shift with abdominal pads d/t constant slow leak. MD will need to assess in AM.

## 2022-04-13 NOTE — Discharge Summary (Signed)
Physician Discharge Summary  Patient ID: Keith Moreno MRN: [id number] DOB/AGE: 61/10/1960 61 y.o.  Admit date: 04/05/2022 Discharge date: 04/28/2022  Admission Diagnoses:  Discharge Diagnoses:  Principal Problem:   Esophageal adenocarcinoma ([place]) Active Problems:   Esophageal cancer ([place])   Malnutrition of moderate degree   Discharged Condition: fair  Referring: [facility]: [facility] Cardiologist: None    History of present illness:  The patient is a 61 year old male with stage IIb esophageal adenocarcinoma referred to [name] for surgical resection.  He has completed radiation but was unable to complete chemotherapy due to neutropenia.  Postradiation PET/CT scan shows reduction in the avidity of the distal esophageal cancer indicating a good response.  He does still have some lymphadenopathy in the gastrohepatic ligament.  Following full discussion with the patient he agreed to proceed with Kathreen Cornfield robotic assisted esophagectomy.  He was admitted electively for the procedure.  Hospital course:  Patient was admitted electively on 04/05/2022 and taken the operating room at which time he underwent robotic assisted Ivor Lewis esophagectomy as well as placement of jejunostomy tube and the EGD.  He tolerated the procedure well was taken to the surgical recovery room in stable condition.  Postoperative hospital course:  Patient is doing well.  He was extubated without difficulty using standard protocols.  His vitals have remained stable and he had only 1 day where he experienced fever but has subsequently defervesced.  He does have an expected acute blood loss anemia which is moderate and slowly decreasing over time.  His preoperative creatinine was 1.64 and is improved postoperatively.  Most recent value on 10/ 13/2023 was 0.86.   He was started on a J-tube feeding regimen with gradual increase to goal rate.  The nutritionists  were assisting with tube feeding management.  A swallowing study done on postoperative day #5 showed no leak and dysphagia 1 diet was initiated.  He was also started on cycling of his tube feeds to nighttime.  His chest tube has been discontinued and plan is to keep his bulb drain in place to be removed at a later time.   Physical and Occupational Therapy have assisted with postoperative recovery and plans are being made for home therapies to be arranged.  Oxygen has been weaned and he maintains good saturations on room air.  He does occasionally desaturate at night and would most likely benefit from a sleep study in the future for potential sleep apnea.  Incisions are noted to be healing well without evidence of infection, however there is some drainage around the J-tube site.  This is foul smelling and wound culture was obtained.  The patient became febrile with mild tachycardia and tachypnea.  Blood cultures were also obtained.  Wound cultures have subsequently grown abundant Klebsiella pneumoniae, abundant Proteus mirabilis, and rare PEDIOCOCCUS PENTOSACEUS .  He was initially placed on Zosyn.  A CT scan of the chest abdomen and pelvis was  obtained and results showed air and fluid collection anterior to the gastric body, pylorus and duodenal bulb.  It was Dr. Abran Duke opinion that this finding is in the region of the pyloroplasty and not consistent with abscess or perforation.  Additionally there was a moderate size left-sided pleural effusion and a thoracentesis has been obtained.  This yielded 1 L of serosanguineous fluid.  On postop day #15 he was transitioned off of Zosyn to oral Cipro.  He received a 7-day course.  He has continued to make good overall  progress, albeit slow in regards to physical recovery.  Home health is arranged for nursing as well as physical and occupational therapies. DME equipment has been arranged to assist with his care at home.  At the time of discharge the patient is felt to  be quite stable.   Consults:  None  Significant Diagnostic Studies:  DG Chest 2 View  Result Date: 04/27/2022 CLINICAL DATA:  Follow-up of pleural effusion EXAM: CHEST - 2 VIEW COMPARISON:  04/25/2022 FINDINGS: Right Port-A-Cath tip at low SVC. Midline trachea. Mild cardiomegaly. Stable mediastinal contours, status post esophagectomy and gastric pull-through. Small left pleural effusion. No pneumothorax. No congestive failure. Left base and right infrahilar airspace disease are similar. IMPRESSION: No change since 04/25/2022. Small left pleural effusion with bilateral lower lung airspace disease, likely atelectasis. Electronically Signed   By: Abigail Miyamoto M.D.   On: 04/27/2022 08:13   DG Chest Port 1 View  Result Date: 04/25/2022 CLINICAL DATA:  Pleural effusion, shortness of breath EXAM: PORTABLE CHEST 1 VIEW COMPARISON:  04/20/2022 FINDINGS: Chest port catheter tip overlies the distal superior vena cava. Unchanged cardiomediastinal silhouette. Postsurgical changes of Ivor Lewis esophagectomy with gastric pull-through. Stable small left pleural effusion with increased adjacent atelectasis. No evidence of pneumothorax. IMPRESSION: Postsurgical changes of esophagectomy. Stable small left pleural effusion with increased adjacent atelectasis. Electronically Signed   By: Maurine Simmering M.D.   On: 04/25/2022 10:56   DG Chest 1 View  Result Date: 04/20/2022 CLINICAL DATA:  Status post esophagectomy EXAM: CHEST  1 VIEW COMPARISON:  04/19/2022 FINDINGS: Unchanged AP portable chest radiograph status post esophagectomy. Unchanged contour of the right mediastinum reflecting a fat containing postoperative hernia. Right-sided chest tube remains in position. Cardiomegaly. Small left pleural effusion. Right chest port catheter. No new airspace opacity. IMPRESSION: Unchanged AP portable chest radiograph status post esophagectomy. No new airspace opacity. Electronically Signed   By: Delanna Ahmadi M.D.   On:  04/20/2022 08:21   IR THORACENTESIS ASP PLEURAL SPACE W/IMG GUIDE  Result Date: 04/19/2022 INDICATION: Shortness of breath. Left-sided pleural effusion. Request for diagnostic and therapeutic thoracentesis. EXAM: ULTRASOUND GUIDED LEFT THORACENTESIS MEDICATIONS: 1% plain lidocaine, 7 mL COMPLICATIONS: None immediate. PROCEDURE: An ultrasound guided thoracentesis was thoroughly discussed with the patient and questions answered. The benefits, risks, alternatives and complications were also discussed. The patient understands and wishes to proceed with the procedure. Written consent was obtained. Ultrasound was performed to localize and mark an adequate pocket of fluid in the left chest. The area was then prepped and draped in the normal sterile fashion. 1% Lidocaine was used for local anesthesia. Under ultrasound guidance a 6 Fr Safe-T-Centesis catheter was introduced. Thoracentesis was performed. The catheter was removed and a dressing applied. FINDINGS: A total of approximately 1.1 L of serosanguineous pleural fluid was removed. Samples were sent to the laboratory as requested by the clinical team. IMPRESSION: Successful ultrasound guided left thoracentesis yielding 1.1 L of pleural fluid. Read by: Ascencion Dike PA-C Electronically Signed   By: Sandi Mariscal M.D.   On: 04/19/2022 13:30   DG Chest 1 View  Result Date: 04/19/2022 CLINICAL DATA:  Pleural effusion on left.  Post thoracentesis. EXAM: CHEST  1 VIEW COMPARISON:  Chest two views 04/18/2022, CT chest 04/18/2022, PET-CT 09/26/2021 FINDINGS: Right chest wall porta catheter tip overlies the central superior vena cava. Heart size is mildly enlarged. There is again widening of the mediastinum and bilateral hilar contours, consistent with the bilateral calcified lymphadenopathy on prior 03/01/2022 PET-CT. On frontal  view, no significant pleural effusion is seen, however note is made that a moderate pleural effusion was seen on yesterday's chest CT and only  visualized on yesterday's lateral chest radiograph. No pneumothorax is seen. No acute skeletal abnormality. IMPRESSION: 1. No significant pleural effusion is seen following thoracentesis, however note is made that a moderate pleural effusion was seen on yesterday's chest CT and this was only visualized on yesterday's lateral chest radiograph. 2. No pneumothorax. Electronically Signed   By: Yvonne Kendall M.D.   On: 04/19/2022 12:49   CT CHEST ABDOMEN PELVIS W CONTRAST  Result Date: 04/18/2022 CLINICAL DATA:  Sepsis, esophageal cancer * Tracking Code: BO * EXAM: CT CHEST, ABDOMEN, AND PELVIS WITH CONTRAST TECHNIQUE: Multidetector CT imaging of the chest, abdomen and pelvis was performed following the standard protocol during bolus administration of intravenous contrast. RADIATION DOSE REDUCTION: This exam was performed according to the departmental dose-optimization program which includes automated exposure control, adjustment of the mA and/or kV according to patient size and/or use of iterative reconstruction technique. CONTRAST:  63m OMNIPAQUE IOHEXOL 350 MG/ML SOLN, additional enteric contrast administered per percutaneous jejunostomy COMPARISON:  CT abdomen pelvis, 01/08/2015, PET-CT, 03/01/2022 FINDINGS: CT CHEST FINDINGS Cardiovascular: Right chest port catheter. Aortic atherosclerosis. Normal heart size. Left coronary artery calcifications. Enlargement of the main pulmonary artery measuring up to 3.8 cm in caliber. No pericardial effusion. Mediastinum/Nodes: Numerous, coarsely calcified mediastinal and hilar lymph nodes, unchanged (series 3, image 21). Status post interval pull-through esophagectomy. Fat containing postoperative hernia adjacent to the gastric pull-through (series 3, image 31). Thyroid and trachea are normal. Lungs/Pleura: Moderate left pleural effusion associated atelectasis or consolidation. Trace, loculated appearing right pleural effusion with right-sided chest tube in position  posteriorly about the lung. Musculoskeletal: No chest wall abnormality. No acute osseous findings. CT ABDOMEN PELVIS FINDINGS Hepatobiliary: No solid liver abnormality is seen. No gallstones, gallbladder wall thickening, or biliary dilatation. Pancreas: Unremarkable. No pancreatic ductal dilatation or surrounding inflammatory changes. Spleen: Normal in size without significant abnormality. Adrenals/Urinary Tract: Adrenal glands are unremarkable. Small bilateral nonobstructive renal calculi. No hydronephrosis. Small focus of air within the urinary bladder, presumably secondary to recent catheterization. Stomach/Bowel: Status post pull-through esophagectomy. Elongated, extraluminal heterogeneous air and fluid collection anterior to the gastric body, pylorus, and duodenal bulb, measuring at least 14.0 x 4.0 x 3.2 cm (series 3, image 61, series 7, image 31). Percutaneous jejunostomy tube. Appendix appears normal. Descending and sigmoid diverticulosis. Vascular/Lymphatic: No significant vascular findings are present. No enlarged abdominal or pelvic lymph nodes. Reproductive: No mass or other abnormality. Other: Fat containing right inguinal hernia.  No ascites. Musculoskeletal: No acute osseous findings. IMPRESSION: 1. Status post interval pull-through esophagectomy. 2. Elongated, extraluminal heterogeneous air and fluid collection anterior to the gastric body, pylorus, and duodenal bulb, measuring at least 14.0 x 4.0 x 3.2 cm. Findings are consistent with postoperative perforation and or abscess. 3. Moderate left pleural effusion and associated atelectasis or consolidation. Trace, loculated appearing right pleural effusion with right-sided chest tube in position posteriorly about the lung. 4. Enlargement of the main pulmonary artery, as can be seen in pulmonary hypertension. 5. Coronary artery disease. 6. Nonobstructive bilateral nephrolithiasis. 7. Descending and sigmoid diverticulosis without evidence of acute  diverticulitis. These results will be called to the ordering clinician or representative by the Radiologist Assistant, and communication documented in the PACS or [employer] Aortic Atherosclerosis (ICD10-I70.0). Electronically Signed   By: ADelanna AhmadiM.D.   On: 04/18/2022 15:25   DG Chest 2 View  Result Date: 04/18/2022 CLINICAL DATA:  Surgery follow-up. History of esophagectomy. Shortness of breath and weakness. History of esophageal cancer. EXAM: CHEST - 2 VIEW COMPARISON:  AP chest 04/17/2022, 04/14/2022, 04/13/2022, 04/11/2022, 04/09/2022; chest two views 04/03/2022; PET-CT 03/01/2022 FINDINGS: Right chest wall porta catheter tip again overlies the central superior vena cava. Cardiac silhouette is again at the upper limits of normal size for AP technique. There is again enlargement of the bilateral hila, noting bilateral calcified lymphadenopathy on prior 03/01/2022 PET-CT. Mildly decreased lung volumes. The lateral view there is associated posterior basilar airspace opacity, possible atelectasis. Otherwise, no focal airspace opacity on frontal view to indicate pneumonia. On lateral view there appear to be small bilateral pleural effusions. No pneumothorax. Moderate multilevel degenerative disc changes of the thoracic spine. IMPRESSION: Small bilateral pleural effusions. Likely associated subsegmental atelectasis. Electronically Signed   By: Yvonne Kendall M.D.   On: 04/18/2022 08:19   DG Chest Port 1 View  Result Date: 04/17/2022 CLINICAL DATA:  [id number] with pneumothorax.  Recent esophagectomy. EXAM: PORTABLE CHEST 1 VIEW COMPARISON:  04/14/2022 portable chest FINDINGS: 5:31 a.m. [place] J port catheter again terminates in the distal SVC. The heart is enlarged. No vascular congestion is seen. No significant pleural effusion. Bilateral hilar adenopathy/enlargement is again noted, mediastinal widening and aortic ectasia and tortuosity. Today there is increased opacity in the retrocardiac left  lower lobe area which could be atelectasis, pneumonia or aspiration. The remaining lungs are clear. In all other respects no further changes. IMPRESSION: Increased opacity left base, could be atelectasis or pneumonia. Follow-up PA Lat recommended. Electronically Signed   By: Telford Nab M.D.   On: 04/17/2022 07:37   DG CHEST PORT 1 VIEW  Result Date: 04/14/2022 CLINICAL DATA:  Follow-up esophagectomy. EXAM: PORTABLE CHEST 1 VIEW COMPARISON:  View chest x-ray 04/13/2022 FINDINGS: Heart size is normal. Right IJ Port-A-Cath is in place. Hilar adenopathy is again noted. Lung volumes are scratched at the lung volumes remain low. Mild bibasilar atelectasis is stable. IMPRESSION: 1. Stable right IJ Port-A-Cath. 2. Low lung volumes and mild bibasilar atelectasis. Electronically Signed   By: San Morelle M.D.   On: 04/14/2022 06:51   DG Chest Port 1 View  Result Date: 04/13/2022 CLINICAL DATA:  Central line removal.  Re-evaluate Port-A-Cath. EXAM: PORTABLE CHEST 1 VIEW COMPARISON:  Chest radiograph 04/11/2022 FINDINGS: A right jugular catheter has been removed. A right jugular Port-A-Cath remains in place with tip overlying the lower SVC. The cardiomediastinal silhouette is unchanged with mediastinal widening and right greater than left hilar enlargement in the setting of esophagectomy and known lymphadenopathy. There are left greater than right basilar opacities with improved aeration of the right lung base. No sizable pleural effusion or pneumothorax is identified. IMPRESSION: 1. Interval removal of right jugular catheter. Unchanged right jugular Port-A-Cath. 2. Left greater than right basilar atelectasis or consolidation, with improved aeration on the right. Electronically Signed   By: Logan Bores M.D.   On: 04/13/2022 17:48   DG CHEST PORT 1 VIEW  Result Date: 04/11/2022 CLINICAL DATA:  Fever, hx esophagectomy EXAM: PORTABLE CHEST - 1 VIEW COMPARISON:  04/09/2022 FINDINGS: Relatively low lung  volumes with some patchy atelectasis/consolidation in both lung bases. Right hilar mass is again noted. Stable right IJ port catheter to the SVC. The right IJ central venous catheter appears kinked near the skin entry site, tip in the proximal SVC. Heart size upper limits normal for technique. Mass abutting the aortic arch as before. There is blunting of  the left lateral costophrenic angle suggesting possible small effusion, stable. Visualized bones unremarkable. IMPRESSION: 1. Low volumes with bibasilar atelectasis/consolidation. 2. 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 3. Kinked right IJ central venous catheter. Electronically Signed   By: Lucrezia Europe M.D.   On: 04/11/2022 08:49   DG ESOPHAGUS W SINGLE CM (SOL OR THIN BA)  Result Date: 04/10/2022 CLINICAL DATA:  Evaluate for leak.  Esophagectomy. EXAM: ESOPHOGRAM/BARIUM SWALLOW TECHNIQUE: Single contrast examination was performed using thin barium or water soluble. FLUOROSCOPY: Radiation Exposure Index (as provided by the fluoroscopic device): 14.6 mGy Kerma COMPARISON:  None Available. FINDINGS: Postsurgical changes are identified. No leak identified. Motility is within normal limits. IMPRESSION: No leak.  No other abnormalities identified. Electronically Signed   By: Dorise Bullion III M.D.   On: 04/10/2022 10:21   DG Chest Port 1 View  Result Date: 04/09/2022 CLINICAL DATA:  Follow-up exam.  Esophagectomy. EXAM: PORTABLE CHEST 1 VIEW COMPARISON:  04/07/2022 and older exams. FINDINGS: Basilar atelectasis, unchanged from the previous exam. Remainder of the lungs is clear. No convincing pleural effusion.  No pneumothorax. Nasal/orogastric tube, right internal jugular central venous lines and right-sided chest tube are stable. IMPRESSION: 1. No significant change from the most recent prior exam. 2. Mild persistent lung base atelectasis. 3. Support apparatus is stable in well positioned. 4. No pneumothorax. Electronically Signed   By: Lajean Manes  M.D.   On: 04/09/2022 08:39   DG Chest 1 View  Result Date: 04/07/2022 CLINICAL DATA:  Chest tube status post esophagectomy EXAM: CHEST  1 VIEW COMPARISON:  Prior chest x-ray 04/06/2022 FINDINGS: Stable cardiac and mediastinal contours consistent with esophagectomy and gastric pull-through. Right IJ single-lumen power injectable port catheter in unchanged position with the catheter overlying the cavoatrial junction. A right IJ central venous catheter is also in place with the tip overlying the mid SVC. Right-sided chest tube in stable position. Gastric tube present with the tip overlying the left upper abdomen. Persistent bibasilar atelectasis. No pneumothorax. Subcutaneous emphysema in the right chest wall and right neck soft tissues. IMPRESSION: 1. Stable and satisfactory support apparatus. 2. Slightly improved inspiratory volumes with decreasing bibasilar atelectasis. Electronically Signed   By: Jacqulynn Cadet M.D.   On: 04/07/2022 09:11   DG Chest 1 View  Result Date: 04/06/2022 CLINICAL DATA:  Status post esophagectomy and gastric pull-through. EXAM: CHEST  1 VIEW COMPARISON:  04/05/2022 FINDINGS: The support apparatus is stable. No complicating features. No right-sided pneumothorax. Stable small amount of right-sided subcutaneous emphysema. Stable prominent mediastinal contours in part due to the gastric pull-through procedure. Low lung volumes with bibasilar atelectasis but no large pleural effusions or obvious pulmonary edema. IMPRESSION: 1. Stable support apparatus. 2. No pneumothorax. 3. Low lung volumes with bibasilar atelectasis. Electronically Signed   By: Marijo Sanes M.D.   On: 04/06/2022 08:13   DG CHEST PORT 1 VIEW  Result Date: 04/05/2022 CLINICAL DATA:  Postop chest surgery.  Esophageal cancer EXAM: PORTABLE CHEST 1 VIEW COMPARISON:  Chest 04/03/2022 FINDINGS: Right chest tube in place. No pneumothorax. Bibasilar atelectasis right greater than left NG tube in the esophagus which is  deviated to the right due to surgery. NG extends into the stomach. No significant pleural effusion Port-A-Cath tip at the cavoatrial junction unchanged. IMPRESSION: Right chest tube in place.  No pneumothorax Bibasilar atelectasis right greater than left. Postop changes in the right lung base. Electronically Signed   By: Franchot Gallo M.D.   On: 04/05/2022 17:36  DG Chest 2 View  Result Date: 04/04/2022 CLINICAL DATA:  Preop exam EXAM: CHEST - 2 VIEW COMPARISON:  Chest radiograph March 31, 2022 FINDINGS: Right anterior chest wall Port-A-Cath is present with tip terminating in the superior vena cava. Stable cardiac contours. Hilar prominence bilaterally. Low lung volumes. Basilar heterogeneous opacities. No pleural effusion or pneumothorax. Thoracic spine degenerative changes. IMPRESSION: Low lung volumes with basilar opacities favored to represent atelectasis. Redemonstrated hilar prominence bilaterally compatible with adenopathy. Electronically Signed   By: Lovey Newcomer M.D.   On: 04/04/2022 11:53   DG Chest 2 View  Result Date: 03/31/2022 CLINICAL DATA:  Shortness of breath EXAM: CHEST - 2 VIEW COMPARISON:  Previous PET-CT done on 03/01/2022 FINDINGS: Transverse diameter of heart is in the upper limits of normal. There is prominence of both hilar regions. There are no signs of pulmonary edema. There are linear densities in the medial right lower lung field. There is no pleural effusion or pneumothorax. Tip of right IJ chest port is seen in superior vena cava. IMPRESSION: There are linear densities in the medial right lower lung fields suggesting scarring or subsegmental atelectasis. Hilar regions are prominent, more so on the left side suggesting possible lymphadenopathy or prominent central pulmonary vessels. Electronically Signed   By: Elmer Picker M.D.   On: 03/31/2022 15:32     Treatments: surgery:   04/05/2022   Patient:  Diamantina Monks Pre-Op Dx: Stage II b esophageal  adenocarcinoma Morbid Obesity DM HTN        Post-op Dx:  same Procedure: - Esophagoscopy - Robotic assisted laparoscopy - Robotic assisted thoracoscopy - Ivor-Lewis esophagectomy - Pyloroplasty - Laparoscopic jejunostomy tube placement 61F - Intercostal nerve block     Surgeon and Role:      * Lightfoot, Lucile Crater, MD - Primary   Assistant: Evonnie Pat, PA-C  PATHOLOGY:   FINAL MICROSCOPIC DIAGNOSIS:   A. LYMPH NODE, LEVEL 7, EXCISION:  -  1 lymph node with fibrosis/scar and abundant noncaseating granulomas.  -  Negative for viable tumor/malignancy on sections examined.   B. 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 near complete  response.  -  21 lymph nodes, negative for malignancy (0/21).  ypT2, ypN0, see note and synoptic report below.   Note: The clinical history of a moderately to poorly differentiated  adenocarcinoma; HER2/neu negative ([id number]) is noted.  The  excisional specimen has a "lesion" that measures 4.6 x 3.6 cm; however,  the vast majority of this lesion consists of predominantly mucosal and  submucosal fibrosis with only focal areas of single cells/rare groups of  cells consistent with residual adenocarcinoma and a tumor regression  score of 1 of 3, i.e. near complete response.  In addition the vast  majority of the lymph nodes show fibrosis/scarlike areas with associated  granulomatous response consistent with complete treatment response.  There is no residual adenocarcinoma identified in the lymph nodes.   ESOPHAGUS, CARCINOMA: Resection   Procedure: Esophagogastrectomy  Tumor Site: Distal esophagus  Relationship of Tumor to Esophagogastric Junction: Just proximal to the  GE junction  Distance of Tumor Center from Esophagogastric Junction: See above  Tumor Size: 4.6 x 3.6 cm   Histologic Type: Adenocarcinoma  Histologic Grade: Poorly differentiated; however this is after  chemoradiation therapy.  Tumor Extension: Focally present in muscularis propria.  Treatment Effect: Tumor regression score (modified  Ryan scheme) 1 of 3  (single cells/rare groups of cancer cells (near complete response)  Lymphovascular Invasion: Not identified  Margins:       Margin Status for Invasive Carcinoma: All margins negative for  invasive carcinoma       Margin Status for Dysplasia and Intestinal Metaplasia: All margins  negative for dysplasia and intestinal metaplasia.  Regional Lymph Nodes:       Number of Lymph Nodes with Tumor: 0 (the majority of the lymph  nodes have fibrosis/scarlike tissue and noncaseating granulomatous  inflammation suggestive of complete response.       Number of Lymph Nodes Examined: 22  Distant Metastasis:       Distant Site(s) Involved: N/A  Pathologic Stage Classification (pTNM, AJCC 8th Edition): ypT2, ypN0  Ancillary Studies: Can be performed at clinician's request  Representative Tumor Block: B6/B7  Comment(s): [None]   (v4.2.0.0)    GROSS DESCRIPTION:   A: The specimen is received fresh and consists of 2 tan-white possible  lymph nodes, measuring 0.5 cm and 1.5 x 1.2 x 0.6 cm.  Bisecting the  largest possible lymph node reveals a tan-gray solid cut surface.  The  specimen is entirely submitted in 2 cassettes.   B: The specimen is received fresh labeled esophagogastrectomy, and  consists of a portion of esophagus and stomach with stapled resection  margins.  The esophagus measures approximately 8.5 cm, and upon opening  the stomach measures approximately 17.5 x 12.0 cm.  The serosal surface  is tan-pink and hyperemic, with attached tan-yellow adipose tissue.  The  outer surface of the esophagus is inked black, and opening reveals a 4.6  x 3.6 cm tan-white, firm, ill-defined area identified.  Please note the  patient is status post  neoadjuvant therapy.  The gastroesophageal  junction is slightly irregular and disrupted, with the possible lesion  lying just proximal to the junction.  The lumen of the stomach is filled  with a small amount of tan-green gelatinous material.  The mucosa is  tan-pink with mild flattening of the mucosal folds.  The possible lesion  measures approximately 2.3 cm from the proximal margin and 12.7 cm to  the closest distal margin.  Sectioning through the possible lesion  reveals a tan, fibrotic cut surface.  No distinct invasion is grossly  identified.  Identified within the surrounding adipose tissue are 22  tan-pink possible lymph nodes, ranging from 0.2 cm to 2.6 x 1.2 x 0.9  cm.  The largest 3 possible lymph nodes display tan-white, indurated cut  surfaces.  Representative sections are submitted in 40 cassettes.  1 = esophagus margin  2 and 3 = closest stomach margin  4-30 = entire possible lesion, sequentially submitted to include GEJ  31 = grossly uninvolved esophagus  32 = stomach  33-36 = 4 possible lymph nodes, each  37 = 3 possible lymph nodes  38 = 1 possible lymph node, bisected  39 and 40 = representative sections from 2 largest possible lymph nodes  Craig Staggers 04/06/2022)     Final Diagnosis performed by Tilford Pillar DO.   Electronically signed  04/07/2022  Technical component performed at [employer]. [facility], [place] [address], [place], [place] [address].   Professional component performed at [facility],  [place] [address], [place], [place] [address].   Immunohistochemistry Technical component (if applicable) was performed  at [facility]. [address] [place],  [place], [place] [address].   IMMUNOHISTOCHEMISTRY DISCLAIMER (if  applicable):  Some of these immunohistochemical stains may have been developed and the  performance characteristics determine by [facility]. Some  may not have been cleared or approved by the  U.S. Food and Drug  Administration. The FDA has determined that such clearance or approval  is not necessary. This test is used for clinical purposes. It should not  be regarded as investigational or for research. This laboratory is  certified under the [place]  (CLIA-88) as qualified to perform high complexity clinical laboratory  testing.  The controls stained appropriately     Discharge Exam: Blood pressure 121/81, pulse 90, temperature 98.1 F (36.7 C), resp. rate 20, height 6' (1.829 m), weight (!) 143.7 kg, SpO2 97 %.  General appearance: alert, cooperative, and no distress Heart: regular rate and rhythm Lungs: clear to auscultation bilaterally Abdomen: Benign Extremities: Minor edema Wound: Incisions healed well jejunostomy site stable in appearance with erythematous maceration and some purulent drainage persists but less   Disposition: Discharge disposition: 01-Home or Self Care       Discharge Instructions     Discharge patient   Complete by: As directed    Discharge disposition: 01-Home or Self Care   Discharge patient date: 04/28/2022      Allergies as of 04/28/2022       Reactions   Procaine    Other reaction(s): Other, Respiratory Distress   Chocolate    Hyperactivity, respiratory distress   Other    Novocaine- respiratory distress        Medication List     STOP taking these medications    diphenhydramine-acetaminophen 25-500 MG Tabs tablet Commonly known as: TYLENOL PM   prochlorperazine 10 MG tablet Commonly known as: COMPAZINE       TAKE these medications    acetaminophen 325 MG tablet Commonly known as: TYLENOL Take 2 tablets (650 mg total) by mouth every 6 (six) hours as needed for fever.   Alogliptin Benzoate 25 MG Tabs Take 12.5 mg by mouth at bedtime.   amLODipine 10 MG tablet Commonly known as: NORVASC Take 10 mg by mouth at bedtime.   B-12 PO Take 1 tablet by mouth daily.    CINNAMON PO Take 4,000 mg by mouth 2 (two) times daily.   feeding supplement Liqd Take 237 mLs by mouth 3 (three) times daily between meals.   FERROUS SULFATE PO Take 1 tablet by mouth daily.   lidocaine 5 % Commonly known as: LIDODERM Place 1 patch onto the skin daily as needed (pain).   lidocaine-prilocaine cream Commonly known as: EMLA Apply 1 application. topically as needed.   lisinopril 40 MG tablet Commonly known as: ZESTRIL Take 40 mg by mouth at bedtime.   Melatonin 10 MG Tabs Take 40 mg by mouth at bedtime as needed (Sleep).   multivitamin with minerals Tabs tablet Take 1 tablet by mouth daily.   olopatadine 0.1 % ophthalmic solution Commonly known as: PATANOL Place 1 drop into both eyes 2 (two) times daily.   ondansetron 8 MG disintegrating tablet Commonly known as: ZOFRAN-ODT Take 1 tablet (8 mg total) by mouth every 8 (eight) hours as needed for nausea or vomiting.   pantoprazole 40 MG tablet Commonly known as: PROTONIX Take 1 tablet (40 mg total) by mouth 2 (two) times daily.   pioglitazone 30 MG tablet Commonly known as: ACTOS Take 30 mg by mouth every morning.   vitamin C 1000 MG tablet Take 2,000 mg by mouth every  morning.   Vitamin D3 50 MCG (2000 UT) capsule Take 20,000 Units by mouth at bedtime.   vitamin E 180 MG (400 UNITS) capsule Take 1,200 Units by mouth 2 (two) times daily.   Zinc Oxide 12.8 % ointment Commonly known as: TRIPLE PASTE Apply topically 2 (two) times daily. To J-Tube site               Durable Medical Equipment  (From admission, onward)           Start     Ordered   04/12/22 1505  For home use only DME Hospital bed  Once       Question Answer Comment  Length of Need 6 Months   The above medical condition requires: Patient requires the ability to reposition frequently   Head must be elevated greater than: 30 degrees   Bed type Semi-electric   Support Surface: Gel Overlay      04/12/22 1507    04/12/22 1452  For home use only DME 4 wheeled rolling walker with seat  Once       Question Answer Comment  Patient needs a walker to treat with the following condition Esophageal adenocarcinoma ([place])   Patient needs a walker to treat with the following condition Weakness      04/12/22 1451   04/12/22 1451  For home use only DME Shower stool  Once        04/12/22 1450            Follow-up Information     Lightfoot, Lucile Crater, MD Follow up.   Specialty: Cardiothoracic Surgery Why: Please see discharge paperwork for details of follow-up appointment with your surgeon Contact information: [place] [address] [place], [facility] Follow up.   Specialty: [place] Why: HHRN,HHPT,HHOT- Agency will call you to set up apt times Contact information: [address] [place] [address] [phone]         [facility] Follow up.   Why: Hospital bed, shower chair and rollaotr will be delivered in 5 business days Contact information: [place] [address] [phone]                 Signed: [name], PA-C  04/28/2022, 1:34 PM

## 2022-04-13 NOTE — Progress Notes (Signed)
Nutrition Follow-up  DOCUMENTATION CODES:   Non-severe (moderate) malnutrition in context of chronic illness  INTERVENTION:   Continue cyclic/nocturnal tube feeds via J-tube: - Change regimen to Osmolite 1.5 @ 110 ml/hr x 12 hours from 2000 to 0800 (total of 1320 ml nightly) - Increase PROSource TF20 60 ml to BID - Free water flushes per MD/PA, currently 200 ml q 4 hours  Cyclic/nocturnal tube feeding regimen with free water flushes provides 2140 kcal, 123 grams of protein, and 2206 ml of H2O (meets 86% of minimum kcal needs and 100% of minimum protein needs).  - Increase Ensure Enlive po to TID, each supplement provides 350 kcal and 20 grams of protein  - Encourage PO intake of dysphagia 1 diet   NUTRITION DIAGNOSIS:   Moderate Malnutrition related to chronic illness (esophageal cancer) as evidenced by mild muscle depletion, percent weight loss (9.2% weight loss in 5 months).  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 Pt did not complete all of his chemotherapy due to neutropenia but did complete radiation on 02/02/22.  09/27 - s/p Ivor Lewis esophagectomy, J-tube placement 10/02 - s/p swallow study showing no leak, NG tube removed, diet advanced to dysphagia 1 with thin liquids  Discussed pt with PA and MD. Will transition pt to 62-XBMW cyclic/nocturnal tube feeding regimen in preparation for discharge. MD okay with meeting >/=80% of pt's estimated needs via  tube feeding since pt is doing fairly well with PO diet.  Per PA, pt with purulent drainage from around the J-tube but does not appear to be tube feedings.  Spoke with pt at bedside. Pt has not yet had an Ensure supplement. Wife reports RN was going to get him one earlier. He is willing to drink these between meals. Pt's wife shares that pt did not eat any solid foods from breakfast meal tray but did consumed 100% of milk and orange juice (180 kcal, 8 grams of protein). Explained plan for 12-hour feeds. Discussed the above with Case Manager.  Admit weight: 150.3 kg Current weight: 151.8 kg  Meal Completion: 10-75% x last 6 meals  Medications reviewed and include: colace 100 mg BID, Ensure Enlive BID, SSI q 4 hours, IV protonix  Labs reviewed: hemoglobin 7.7, HCT 23.8 CBG's: 139-243 x 24 hours  UOP: 1200 ml x 24 hours R chest JP drain: 40 ml x 24 hours I/O's: +796 ml since admit  Diet Order:   Diet Order             DIET - DYS 1 Room service appropriate? Yes with Assist; Fluid consistency: Thin  Diet effective now                   EDUCATION NEEDS:   Education needs have been addressed  Skin:  Skin Assessment: Reviewed RN Assessment (incisions to chest and abdomen)  Last BM:  04/12/22 large  Height:   Ht Readings from Last 1 Encounters:  04/05/22 6' (1.829 m)    Weight:   Wt Readings from Last  1 Encounters:  04/13/22 (!) 151.8 kg    Ideal Body Weight:  80.9 kg  BMI:  Body mass index is 45.39 kg/m.  Estimated Nutritional Needs:   Kcal:  2500-2700  Protein:  120-140 grams  Fluid:  >/= 2.2 L    Gustavus Bryant, MS, RD, LDN Inpatient Clinical Dietitian Please see AMiON for contact information.

## 2022-04-13 NOTE — Progress Notes (Signed)
PT Cancellation Note  Patient Details Name: Keith Moreno MRN: [id number] DOB: Mar 21, 1961   Cancelled Treatment:    Reason Eval/Treat Not Completed: Patient declined, no reason specified, pt declining x2 attempts despite education and encouragement. Will check back as schedule allows to continue with PT POC.  [name]. PTA Acute Rehabilitation Services Office: [place] 04/13/2022, 3:38 PM

## 2022-04-14 ENCOUNTER — Inpatient Hospital Stay (HOSPITAL_COMMUNITY): Payer: No Typology Code available for payment source

## 2022-04-14 LAB — GLUCOSE, CAPILLARY
Glucose-Capillary: 183 mg/dL — ABNORMAL HIGH (ref 70–99)
Glucose-Capillary: 218 mg/dL — ABNORMAL HIGH (ref 70–99)
Glucose-Capillary: 182 mg/dL — ABNORMAL HIGH (ref 70–99)
Glucose-Capillary: 149 mg/dL — ABNORMAL HIGH (ref 70–99)
Glucose-Capillary: 125 mg/dL — ABNORMAL HIGH (ref 70–99)
Glucose-Capillary: 160 mg/dL — ABNORMAL HIGH (ref 70–99)

## 2022-04-14 LAB — BASIC METABOLIC PANEL
Anion gap: 8 (ref 5–15)
BUN: 27 mg/dL — ABNORMAL HIGH (ref 8–23)
CO2: 28 mmol/L (ref 22–32)
Calcium: 11.5 mg/dL — ABNORMAL HIGH (ref 8.9–10.3)
Chloride: 98 mmol/L (ref 98–111)
Creatinine, Ser: 0.92 mg/dL (ref 0.61–1.24)
GFR, Estimated: >60 mL/min (ref >60)
Glucose, Bld: 168 mg/dL — ABNORMAL HIGH (ref 70–99)
Potassium: 4.6 mmol/L (ref 3.5–5.1)
Sodium: 134 mmol/L — ABNORMAL LOW (ref 135–145)

## 2022-04-14 LAB — CBC
HCT: 24.3 % — ABNORMAL LOW (ref 39.0–52.0)
Hemoglobin: 8.4 g/dL — ABNORMAL LOW (ref 13.0–17.0)
MCH: 31.8 pg (ref 26.0–34.0)
MCHC: 34.6 g/dL (ref 30.0–36.0)
MCV: 92 fL (ref 80.0–100.0)
Platelets: 216 10*3/uL (ref 150–400)
RBC: 2.64 MIL/uL — ABNORMAL LOW (ref 4.22–5.81)
RDW: 13.4 % (ref 11.5–15.5)
WBC: 8.5 10*3/uL (ref 4.0–10.5)
nRBC: 0 % (ref 0.0–0.2)

## 2022-04-14 MED ORDER — PIPERACILLIN-TAZOBACTAM 3.375 G IVPB: 3.3750 g | Freq: Three times a day (TID) | INTRAVENOUS | Status: DC

## 2022-04-14 MED ORDER — FUROSEMIDE 10 MG/ML IJ SOLN
40.0000 mg | Freq: Once | INTRAMUSCULAR | Status: AC
  Administered 2022-04-14: 40 mg via INTRAVENOUS
  Filled 2022-04-14: qty 4

## 2022-04-14 MED ORDER — PIPERACILLIN-TAZOBACTAM 3.375 G IVPB 30 MIN
3.3750 g | Freq: Once | INTRAVENOUS | Status: DC
  Filled 2022-04-14: qty 50

## 2022-04-14 NOTE — Progress Notes (Signed)
Physical Therapy Treatment Patient Details Name: Keith Moreno MRN: [id number] DOB: 03/01/1961 Today's Date: 04/14/2022   History of Present Illness 61 yo male admitted 9/27 for esophagectomy with J tube placement due to esophageal CA. PMhx: chronic low back pain, esophageal CA    PT Comments    Pt in chair on arrival and continues to benefit from encouragement to mobilize as pt reports fatigue along with back and incisional pain limiting his tolerance. Pt on 1-2L during gait this session to maintain SPO2 >90% and slowly increasing gait tolerance. Wife present and reports pt needs to be able to walk at least 33' to achieve functional home distance which he as able to do with effort. Pt still needs to clear 2 steps to enter home with max encouragement for increased mobility and OOB throughout day.   HR 93-102 139/75 in standing   Recommendations for follow up therapy are one component of a multi-disciplinary discharge planning process, led by the attending physician.  Recommendations may be updated based on patient status, additional functional criteria and insurance authorization.  Follow Up Recommendations  Home health PT     Assistance Recommended at Discharge Intermittent Supervision/Assistance  Patient can return home with the following A little help with walking and/or transfers;A little help with bathing/dressing/bathroom;Assistance with cooking/housework;Assist for transportation;Help with stairs or ramp for entrance   Equipment Recommendations  Rollator (4 wheels)    Recommendations for Other Services       Precautions / Restrictions Precautions Precautions: Fall;Other (comment) Precaution Comments: JP drain rt flank, PEG     Mobility  Bed Mobility               General bed mobility comments: In chair on arrival and end of session    Transfers Overall transfer level: Needs assistance   Transfers: Sit to/from Stand Sit to Stand: Min guard            General transfer comment: cues for hand placement and safety with pt able to stand from chair and rollator    Ambulation/Gait Ambulation/Gait assistance: Min guard Gait Distance (Feet): 60 Feet Assistive device: Rollator (4 wheels) Gait Pattern/deviations: Step-through pattern, Decreased stride length       General Gait Details: pt on 1-2L with spo2 >90%. Pt walked 60' x 2 trials with seated rest between bouts limited by fatigue   Stairs             Wheelchair Mobility    Modified Rankin (Stroke Patients Only)       Balance Overall balance assessment: Needs assistance   Sitting balance-Leahy Scale: Good     Standing balance support: Bilateral upper extremity supported, Reliant on assistive device for balance Standing balance-Leahy Scale: Poor Standing balance comment: rollator in standing                            Cognition Arousal/Alertness: Awake/alert Behavior During Therapy: Flat affect Overall Cognitive Status: Within Functional Limits for tasks assessed                                 General Comments: limited interaction but appropriate responses, delayed response at times        Exercises General Exercises - Lower Extremity Long Arc Quad: AROM, Both, 10 reps, Seated    General Comments        Pertinent Vitals/Pain Pain Assessment Pain Assessment: No/denies pain  Home Living                          Prior Function            PT Goals (current goals can now be found in the care plan section) Progress towards PT goals: Progressing toward goals    Frequency    Min 3X/week      PT Plan Current plan remains appropriate    Co-evaluation              AM-PAC PT "6 Clicks" Mobility   Outcome Measure  Help needed turning from your back to your side while in a flat bed without using bedrails?: A Little Help needed moving from lying on your back to sitting on the side of a flat bed without  using bedrails?: A Little Help needed moving to and from a bed to a chair (including a wheelchair)?: A Little Help needed standing up from a chair using your arms (e.g., wheelchair or bedside chair)?: A Little Help needed to walk in hospital room?: A Little Help needed climbing 3-5 steps with a railing? : A Lot 6 Click Score: 17    End of Session Equipment Utilized During Treatment: Oxygen Activity Tolerance: Patient tolerated treatment well Patient left: in chair;with call bell/phone within reach;with family/visitor present Nurse Communication: Mobility status PT Visit Diagnosis: Other abnormalities of gait and mobility (R26.89);Difficulty in walking, not elsewhere classified (R26.2);Muscle weakness (generalized) (M62.81)     Time: 9735-3299 PT Time Calculation (min) (ACUTE ONLY): 30 min  Charges:  $Gait Training: 8-22 mins $Therapeutic Activity: 8-22 mins                     [name], PT Acute Rehabilitation Services Office: [place] 04/14/2022, 1:48 PM

## 2022-04-14 NOTE — Progress Notes (Signed)
Pharmacy Antibiotic Note  Keith Moreno is a 61 y.o. male s/p s/p esophagectomy + jejunostomy tube placement 9/27 and purulent drainage from J tube site. Pharmacy has been consulted for zosyn dosing. -WBC= 8.5, SCr= 0.92  Plan: -Zosyn 3.375gm IV q8h -Will follow renal function, cultures and clinical progress    Height: 6' (182.9 cm) Weight: (!) 148.9 kg (328 lb 4.2 oz) IBW/kg (Calculated) : 77.6  Temp (24hrs), Avg:98.8 F (37.1 C), Min:98.3 F (36.8 C), Max:99.5 F (37.5 C)  Recent Labs  Lab 04/09/22 0440 04/11/22 0900 04/13/22 0507 04/14/22 0500  WBC 8.2 10.2 7.2 8.5  CREATININE 1.03 1.04  --  0.92    Estimated Creatinine Clearance: 126.5 mL/min (by C-G formula based on SCr of 0.92 mg/dL).    Allergies  Allergen Reactions   Procaine     Other reaction(s): Other, Respiratory Distress   Chocolate     Hyperactivity, respiratory distress   Other     Novocaine- respiratory distress    Antimicrobials this admission: 10/6 zosyn   Microbiology results: 10/6 wound   Thank you for allowing pharmacy to be a part of this patient's care.  Hildred Laser, PharmD Clinical Pharmacist **Pharmacist phone directory can now be found on [email or web address] ([email or web address]).  Listed under [place].

## 2022-04-14 NOTE — Progress Notes (Addendum)
Patient having increased expiratory wheezing with RR going to 30's this morning; may need a diuretic to pull some fluid since SOB with exertion.  Had to use bedpan instead of BSC this AM for his safety. Also, may need to watch for signs of Sepsis since PEG tube site has some purulent drainage and odor with culture cancelled.  Will continue to monitor and teach deep breathing and pass onto next shift.

## 2022-04-14 NOTE — Progress Notes (Addendum)
      [address]       [place] [address]             [phone]      9 Days Post-Op Procedure(s) (LRB): XI ROBOTIC ASSISTED IVOR LEWIS ESOPHAGECTOMY (N/A) XI ROBOTIC ASSISTED JEJUNOSTOMY TUBE PLACEMENT (N/A) ESOPHAGOGASTRODUODENOSCOPY (EGD) (N/A) NODE DISSECTION INTERCOSTAL NERVE BLOCK  Subjective:  Patient with increased work of breathing overnight and this morning.  Pain at J tube site  Objective: Vital signs in last 24 hours: Temp:  [98.3 F (36.8 C)-99.4 F (37.4 C)] 99.4 F (37.4 C) (10/06 0339) Pulse Rate:  [66-98] 89 (10/06 0507) Cardiac Rhythm: Normal sinus rhythm;Bundle branch block;Heart block (10/05 1900) Resp:  [18-28] 19 (10/06 0507) BP: (110-141)/(64-94) 141/64 (10/06 0339) SpO2:  [91 %-96 %] 96 % (10/06 0507) Weight:  [148.9 kg] 148.9 kg (10/06 0339)  Intake/Output from previous day: 10/05 0701 - 10/06 0700 In: 5620.3 [P.O.:220; NG/GT:5280.3] Out: 2895 [Urine:2850; Drains:45]  General appearance: alert, cooperative, and mild distress Heart: regular rate and rhythm and tachycardia Lungs: wheezes bilaterally and tachypneic Abdomen: soft, non-tender; bowel sounds normal; no masses,  no organomegaly Extremities: extremities normal, atraumatic, no cyanosis or edema Wound: incision sites are C/D/I, JP drain with serous drain... J Tube site with maceration, large amounts of foul smelling purulent drainage  Lab Results: Recent Labs    04/13/22 0507 04/14/22 0500  WBC 7.2 8.5  HGB 7.7* 8.4*  HCT 23.8* 24.3*  PLT 165 216   BMET:  Recent Labs    04/11/22 0900 04/14/22 0500  NA 134* 134*  K 4.1 4.6  CL 102 98  CO2 25 28  GLUCOSE 227* 168*  BUN 24* 27*  CREATININE 1.04 0.92  CALCIUM 10.3 11.5*    PT/INR: No results for input(s): "LABPROT", "INR" in the last 72 hours. ABG    Component Value Date/Time   PHART 7.41 04/06/2022 0332   HCO3 24.1 04/06/2022 0332   TCO2 28 11/13/2021 2122   ACIDBASEDEF 0.3 04/06/2022 0332    O2SAT 98 04/06/2022 0332   CBG (last 3)  Recent Labs    04/13/22 2306 04/14/22 0337 04/14/22 0736  GLUCAP 180* 183* 218*    Assessment/Plan: S/P Procedure(s) (LRB): XI ROBOTIC ASSISTED IVOR LEWIS ESOPHAGECTOMY (N/A) XI ROBOTIC ASSISTED JEJUNOSTOMY TUBE PLACEMENT (N/A) ESOPHAGOGASTRODUODENOSCOPY (EGD) (N/A) NODE DISSECTION INTERCOSTAL NERVE BLOCK  CV- tachycardia, elevated BP at times- continue prn hydralazine Pulm- increased work of breathing, + wheezing at times.. CXR with atelectasis, will treat with IV lasix today Expected post operative blood loss anemia, mild Hgb at 8.4 ID- large amount of foul smelling purulent drainage from J tube site, febrile this morning, tachycardic, tachypneic- Im concerned patient is getting active infection/sepsis, I have sent wound culture, and obtained blood cultures, pharmacy consult for ABX recommendations.. monitor closely GI- tolerating tube feeds and diet w/o difficulty, has moved bowels DM- sugars have been mostly controlled, elevated this morning >200- continue SSIP if remains elevated can add long acting insulin for additional control   LOS: 9 days   Addendum: As discussed with [name], he feels bile is leaking around J Tube and ABX are not indicated.  I was told to discontinue ABX that I requested pharmacy to order.     Ellwood Handler, PA-C 04/14/2022

## 2022-04-14 NOTE — Plan of Care (Signed)
Discussed with patient plan of care for the evening, pain management and bedtime medications with some teach back displayed.  Also, talked about the importance of his mobility during the day to do PT to regain his strength.  Patient still having drainage from PEG tube with looks purulent and has an odor.  Patient and wife discussed the took a swab from it.  But it looks like it was cancelled before being processed to rule out another source of potential infection.  Problem: Education: Goal: Knowledge of General Education information will improve Description: Including pain rating scale, medication(s)/side effects and non-pharmacologic comfort measures Outcome: Progressing   Problem: Health Behavior/Discharge Planning: Goal: Ability to manage health-related needs will improve Outcome: Progressing

## 2022-04-14 NOTE — Progress Notes (Signed)
Brief Nutrition Note  Discussed pt with RN and MD. Plan is to continue 12-hour nocturnal tube feeds at this time. Pt to remain on dysphagia 1 diet with oral nutrition supplements ordered. Please see RD follow-up note from 10/05 for details.  Spoke with pt and wife at bedside. Provided pt with a strawberry Ensure Enlive. RN aware. Pt reports tolerating 12-hour nocturnal tube feeds without issue. Pt and wife report that they are having issues with getting food on pt's meal tray that he will eat. RD took pt's preferences and meal orders through dinner on Sunday.  RD will continue to follow pt during admission.   Gustavus Bryant, MS, RD, LDN Inpatient Clinical Dietitian Please see AMiON for contact information.

## 2022-04-14 NOTE — Progress Notes (Signed)
Mobility Specialist Progress Note    04/14/22 1456  Mobility  Activity Ambulated with assistance in hallway  Activity Response Tolerated fair  Distance Ambulated (ft) 130 ft (65+65)  $Mobility charge 1 Mobility  Level of Assistance Contact guard assist, steadying assist  Assistive Device Four wheel walker  Mobility Referral Yes   Pre-Mobility: 102 HR, 94% SpO2  Pt received in chair and agreeable. On 1LO2. Took x1 extended seated rest break. Returned to bed with call bell in reach.   Hildred Alamin Mobility Specialist

## 2022-04-14 NOTE — Progress Notes (Signed)
Occupational Therapy Treatment Patient Details Name: Keith Moreno MRN: [id number] DOB: 08-Sep-1960 Today's Date: 04/14/2022   History of present illness 61 yo male admitted 9/27 for esophagectomy with J tube placement due to esophageal CA. PMhx: chronic low back pain, esophageal CA   OT comments  Pt. Seen for skilled OT session.  Pt.s spouse present and active in her support and care for him.  Pt. Able to complete bed mobility, and lb dressing task seated eob.  Pt. Able to perform in room ambulation around the bed to recliner.  Education provided to both on energy conservation strategies during ADLS.  Agree with current d/c recommendations.     Recommendations for follow up therapy are one component of a multi-disciplinary discharge planning process, led by the attending physician.  Recommendations may be updated based on patient status, additional functional criteria and insurance authorization.    Follow Up Recommendations  Home health OT    Assistance Recommended at Discharge Intermittent Supervision/Assistance  Patient can return home with the following  A little help with walking and/or transfers;A little help with bathing/dressing/bathroom;Assistance with cooking/housework;Assist for transportation;Help with stairs or ramp for entrance   Equipment Recommendations       Recommendations for Other Services      Precautions / Restrictions Precautions Precautions: Fall;Other (comment) Precaution Comments: JP drain rt flank, PEG       Mobility Bed Mobility Overal bed mobility: Modified Independent             General bed mobility comments: HOB elevated    Transfers Overall transfer level: Needs assistance   Transfers: Sit to/from Stand, Bed to chair/wheelchair/BSC Sit to Stand: Min guard                 Balance                                           ADL either performed or assessed with clinical judgement   ADL Overall ADL's : Needs  assistance/impaired Eating/Feeding: Independent;Sitting                   Lower Body Dressing: Min guard;Sitting/lateral leans Lower Body Dressing Details (indicate cue type and reason): able to turn in bed each direction to bring leg in bed and don/doff socks Toilet Transfer: Min guard;Ambulation;Rolling walker (2 wheels) Toilet Transfer Details (indicate cue type and reason): simulated with in room ambulation around the bed to recliner (pt. and spouse report he amb. to/from b.room for toileting with assistance earlier today)         Functional mobility during ADLs: Min guard;Rolling walker (2 wheels)      Extremity/Trunk Assessment              Vision       Perception     Praxis      Cognition Arousal/Alertness: Awake/alert Behavior During Therapy: Flat affect Overall Cognitive Status: Within Functional Limits for tasks assessed                                 General Comments: appears slow processing, wife states its taking him longer to speak or answer he states its related to difficulty breathing not cognition        Exercises      Shoulder Instructions       General  Comments      Pertinent Vitals/ Pain       Pain Assessment Pain Assessment: No/denies pain  Home Living                                          Prior Functioning/Environment              Frequency  Min 2X/week        Progress Toward Goals  OT Goals(current goals can now be found in the care plan section)  Progress towards OT goals: Progressing toward goals     Plan Discharge plan remains appropriate    Co-evaluation                 AM-PAC OT "6 Clicks" Daily Activity     Outcome Measure   Help from another person eating meals?: None Help from another person taking care of personal grooming?: A Little Help from another person toileting, which includes using toliet, bedpan, or urinal?: A Little Help from another person  bathing (including washing, rinsing, drying)?: A Lot Help from another person to put on and taking off regular upper body clothing?: A Little Help from another person to put on and taking off regular lower body clothing?: A Lot 6 Click Score: 17    End of Session Equipment Utilized During Treatment: Rolling walker (2 wheels);Oxygen  OT Visit Diagnosis: Unsteadiness on feet (R26.81);Other abnormalities of gait and mobility (R26.89);Muscle weakness (generalized) (M62.81);Pain   Activity Tolerance Patient tolerated treatment well   Patient Left in chair;with call bell/phone within reach;with nursing/sitter in room;with family/visitor present   Nurse Communication Other (comment) (reviewed with rn i moved the location of o2 tubing)        Time: 3710-6269 OT Time Calculation (min): 31 min  Charges: OT General Charges $OT Visit: 1 Visit OT Treatments $Self Care/Home Management : 23-37 mins  Sonia Baller, COTA/L Acute Rehabilitation [phone]   Clearnce Sorrel Lorraine-COTA/L 04/14/2022, 11:59 AM

## 2022-04-14 NOTE — Progress Notes (Signed)
PT Cancellation Note  Patient Details Name: Tyse Auriemma MRN: [id number] DOB: [birth year]   Cancelled Treatment:    Reason Eval/Treat Not Completed: Patient at procedure or test/unavailable (working with OT)   Sandy Salaam Chrishana Spargur 04/14/2022, 11:41 AM [place] Office: [phone]

## 2022-04-15 LAB — GLUCOSE, CAPILLARY
Glucose-Capillary: 130 mg/dL — ABNORMAL HIGH (ref 70–99)
Glucose-Capillary: 158 mg/dL — ABNORMAL HIGH (ref 70–99)
Glucose-Capillary: 168 mg/dL — ABNORMAL HIGH (ref 70–99)
Glucose-Capillary: 172 mg/dL — ABNORMAL HIGH (ref 70–99)
Glucose-Capillary: 185 mg/dL — ABNORMAL HIGH (ref 70–99)
Glucose-Capillary: 213 mg/dL — ABNORMAL HIGH (ref 70–99)

## 2022-04-15 LAB — CBC
HCT: 23.8 % — ABNORMAL LOW (ref 39.0–52.0)
Hemoglobin: 7.6 g/dL — ABNORMAL LOW (ref 13.0–17.0)
MCH: 30.8 pg (ref 26.0–34.0)
MCHC: 31.9 g/dL (ref 30.0–36.0)
MCV: 96.4 fL (ref 80.0–100.0)
Platelets: 226 10*3/uL (ref 150–400)
RBC: 2.47 MIL/uL — ABNORMAL LOW (ref 4.22–5.81)
RDW: 13.3 % (ref 11.5–15.5)
WBC: 9 10*3/uL (ref 4.0–10.5)
nRBC: 0 % (ref 0.0–0.2)

## 2022-04-15 LAB — BASIC METABOLIC PANEL
Anion gap: 8 (ref 5–15)
BUN: 33 mg/dL — ABNORMAL HIGH (ref 8–23)
CO2: 29 mmol/L (ref 22–32)
Calcium: 10.5 mg/dL — ABNORMAL HIGH (ref 8.9–10.3)
Chloride: 96 mmol/L — ABNORMAL LOW (ref 98–111)
Creatinine, Ser: 1.08 mg/dL (ref 0.61–1.24)
GFR, Estimated: >60 mL/min (ref >60)
Glucose, Bld: 249 mg/dL — ABNORMAL HIGH (ref 70–99)
Potassium: 4 mmol/L (ref 3.5–5.1)
Sodium: 133 mmol/L — ABNORMAL LOW (ref 135–145)

## 2022-04-15 MED ORDER — SODIUM CHLORIDE 0.9% FLUSH: 10.0000 mL | INTRAVENOUS | Status: DC | PRN

## 2022-04-15 MED ORDER — SODIUM CHLORIDE 0.9% FLUSH
10.0000 mL | Freq: Two times a day (BID) | INTRAVENOUS | Status: DC
  Administered 2022-04-15: 10 mL via INTRAVENOUS
  Administered 2022-04-16: 30 mL via INTRAVENOUS
  Administered 2022-04-16 – 2022-04-28 (×15): 10 mL via INTRAVENOUS

## 2022-04-15 MED ORDER — SODIUM CHLORIDE 0.9% FLUSH: 3.0000 mL | INTRAVENOUS | Status: DC | PRN

## 2022-04-15 MED ORDER — SODIUM CHLORIDE 0.9% FLUSH
3.0000 mL | Freq: Two times a day (BID) | INTRAVENOUS | Status: DC
  Administered 2022-04-15 – 2022-04-28 (×19): 3 mL via INTRAVENOUS

## 2022-04-15 NOTE — Progress Notes (Signed)
Mobility Specialist Progress Note:   04/15/22 1450  Mobility  Activity Ambulated with assistance in hallway  Activity Response Tolerated well  Distance Ambulated (ft)  (40+60+60)  $Mobility charge 1 Mobility  Level of Assistance Standby assist, set-up cues, supervision of patient - no hands on  Assistive Device Four wheel walker   Pt received in chair willing to participate in mobility. No complaints of pain. Required 2 seated rest breaks d/t fatigue. Left in bed with call bell in reach and all needs met.   [facility] Surveyor, mining Chat only

## 2022-04-15 NOTE — Progress Notes (Signed)
Mobility Specialist Progress Note:   04/15/22 1147  Mobility  Activity Transferred from bed to chair  Activity Response Tolerated well  Distance Ambulated (ft) 4 ft  $Mobility charge 1 Mobility  Level of Assistance Standby assist, set-up cues, supervision of patient - no hands on  Assistive Device Front wheel walker   Pt received in bed. Pt wanting to walk after lunch but was willing to sit up in chair to eat. No complaints of pain. Left in chair with call bell in reach and all needs met.   [facility] Surveyor, mining Chat only

## 2022-04-15 NOTE — Plan of Care (Signed)
  Problem: Education: Goal: Knowledge of General Education information will improve Description: Including pain rating scale, medication(s)/side effects and non-pharmacologic comfort measures Outcome: Progressing   Problem: Health Behavior/Discharge Planning: Goal: Ability to manage health-related needs will improve Outcome: Progressing   Problem: Clinical Measurements: Goal: Ability to maintain clinical measurements within normal limits will improve Outcome: Progressing Goal: Will remain free from infection Outcome: Progressing Goal: Diagnostic test results will improve Outcome: Progressing Goal: Respiratory complications will improve Outcome: Progressing Goal: Cardiovascular complication will be avoided Outcome: Progressing   Problem: Activity: Goal: Risk for activity intolerance will decrease Outcome: Progressing   Problem: Nutrition: Goal: Adequate nutrition will be maintained Outcome: Progressing   Problem: Coping: Goal: Level of anxiety will decrease Outcome: Progressing   Problem: Elimination: Goal: Will not experience complications related to bowel motility Outcome: Progressing Goal: Will not experience complications related to urinary retention Outcome: Progressing   Problem: Pain Managment: Goal: General experience of comfort will improve Outcome: Progressing   Problem: Safety: Goal: Ability to remain free from injury will improve Outcome: Progressing   Problem: Skin Integrity: Goal: Risk for impaired skin integrity will decrease Outcome: Progressing   Problem: Education: Goal: Knowledge of the prescribed therapeutic regimen will improve Outcome: Progressing   Problem: Bowel/Gastric: Goal: Gastrointestinal status for postoperative course will improve Outcome: Progressing   Problem: Nutritional: Goal: Ability to achieve adequate nutritional intake will improve Outcome: Progressing   Problem: Clinical Measurements: Goal: Postoperative  complications will be avoided or minimized Outcome: Progressing   Problem: Respiratory: Goal: Ability to maintain a clear airway will improve Outcome: Progressing   Problem: Skin Integrity: Goal: Demonstration of wound healing without infection will improve Outcome: Progressing   Problem: Safety: Goal: Non-violent Restraint(s) Outcome: Progressing

## 2022-04-15 NOTE — Progress Notes (Signed)
     [address]       [place] [address]             [phone]       No events Coughing overnight  Vitals:   04/15/22 0745 04/15/22 1143  BP: 130/67 131/67  Pulse: 84 89  Resp: (!) 22 (!) 26  Temp: 98.9 F (37.2 C) 98.7 F (37.1 C)  SpO2: 94% 95%   Alert NAD Sinus EWOB ND, soft, bilious output from J tube site  POD 10 s/p Robotic assisted Ivor Lewis Medically ready for discharge. [place]

## 2022-04-15 NOTE — Progress Notes (Signed)
Mobility Specialist Progress Note:   04/15/22 1325  Mobility  Activity Ambulated with assistance in room;Ambulated with assistance to bathroom  Activity Response Tolerated well  Distance Ambulated (ft) 22 ft  $Mobility charge 1 Mobility  Level of Assistance Standby assist, set-up cues, supervision of patient - no hands on  Assistive Device Front wheel walker   Attempted to see pt for hallway ambulation but pt just received lunch tray and asked me to bring him to bathroom. No complaints of pain. Pt able to have a BM but required max A for peri-care. Left in chair with call bell in reach and all needs met.   [facility] Surveyor, mining Chat only

## 2022-04-16 LAB — GLUCOSE, CAPILLARY
Glucose-Capillary: 192 mg/dL — ABNORMAL HIGH (ref 70–99)
Glucose-Capillary: 188 mg/dL — ABNORMAL HIGH (ref 70–99)
Glucose-Capillary: 225 mg/dL — ABNORMAL HIGH (ref 70–99)
Glucose-Capillary: 134 mg/dL — ABNORMAL HIGH (ref 70–99)
Glucose-Capillary: 122 mg/dL — ABNORMAL HIGH (ref 70–99)

## 2022-04-16 LAB — AEROBIC CULTURE W GRAM STAIN (SUPERFICIAL SPECIMEN): Gram Stain: NONE SEEN

## 2022-04-16 NOTE — Progress Notes (Signed)
Mobility Specialist Progress Note:   04/16/22 1058  Mobility  Activity Ambulated with assistance in hallway  Activity Response Tolerated well  Distance Ambulated (ft) 120 ft (40+80)  $Mobility charge 1 Mobility  Level of Assistance Standby assist, set-up cues, supervision of patient - no hands on  Assistive Device Four wheel walker   Pt received in chair willing to participate in mobility. No complaints of pain. Required 1 seated break. Left in bed per RN for dressing change. Pt O2 remained above 90% on RA.   [facility] Surveyor, mining Chat only

## 2022-04-16 NOTE — Progress Notes (Signed)
[address]       [place] [address]             [phone]      11 Days Post-Op Procedure(s) (LRB): XI ROBOTIC ASSISTED IVOR LEWIS ESOPHAGECTOMY (N/A) XI ROBOTIC ASSISTED JEJUNOSTOMY TUBE PLACEMENT (N/A) ESOPHAGOGASTRODUODENOSCOPY (EGD) (N/A) NODE DISSECTION INTERCOSTAL NERVE BLOCK Subjective: Up in the bedside chair.  Says he feels about the same.  Continues to deal with migraine headache which has been chronic since 1989.   Was able to eat most of his breakfast this morning without any difficulty.  Currently on room air with oxygen saturation of 92%.  He continues to use the oxygen at night.  Objective: Vital signs in last 24 hours: Temp:  [98.7 F (37.1 C)-99.2 F (37.3 C)] 98.9 F (37.2 C) (10/08 0824) Pulse Rate:  [75-91] 91 (10/08 0343) Cardiac Rhythm: Heart block (10/08 0700) Resp:  [17-29] 21 (10/08 0343) BP: (123-135)/(49-69) 135/69 (10/08 0824) SpO2:  [94 %-97 %] 95 % (10/08 0343) Weight:  [147.8 kg] 147.8 kg (10/08 0350)     Intake/Output from previous day: 10/07 0701 - 10/08 0700 In: 677 [P.O.:477] Out: 500 [Urine:500] Intake/Output this shift: No intake/output data recorded.  General appearance: alert, cooperative, and mild distress Neurologic: intact Heart: Regular rate and rhythm.  No significant arrhythmias. Lungs: breath sounds are clear. O2 sat 92% on room air, Abdomen: Obese.  J-tube exits the left side of the abdomen.  There is green, foul-smelling mucoid drainage on the dressings around the J-tube exit site Wound: The port incisions are intact and dry, healing with no signs of complication.  Lab Results: Recent Labs    04/14/22 0500 04/15/22 0420  WBC 8.5 9.0  HGB 8.4* 7.6*  HCT 24.3* 23.8*  PLT 216 226   BMET:  Recent Labs    04/14/22 0500 04/15/22 0420  NA 134* 133*  K 4.6 4.0  CL 98 96*  CO2 28 29  GLUCOSE 168* 249*  BUN 27* 33*  CREATININE 0.92 1.08  CALCIUM 11.5* 10.5*    PT/INR: No results  for input(s): "LABPROT", "INR" in the last 72 hours. ABG    Component Value Date/Time   PHART 7.41 04/06/2022 0332   HCO3 24.1 04/06/2022 0332   TCO2 28 11/13/2021 2122   ACIDBASEDEF 0.3 04/06/2022 0332   O2SAT 98 04/06/2022 0332   CBG (last 3)  Recent Labs    04/15/22 2338 04/16/22 0331 04/16/22 0821  GLUCAP 172* 192* 188*    Assessment/Plan: S/P Procedure(s) (LRB): XI ROBOTIC ASSISTED IVOR LEWIS ESOPHAGECTOMY (N/A) XI ROBOTIC ASSISTED JEJUNOSTOMY TUBE PLACEMENT (N/A) ESOPHAGOGASTRODUODENOSCOPY (EGD) (N/A) NODE DISSECTION INTERCOSTAL NERVE BLOCK  - Postop day 28 Ivor-Lewis  robotic assisted esophagectomy for esophageal adenocarcinoma with placement of a jejunostomy feeding tube.  Had about 500 mL oral intake yesterday.  Continues to receive the tube feeding but with jejunostomy tube at night.   -Expected acute blood loss anemia-appears to be tolerating this reasonably well.  -Endo: History of type 2 diabetes mellitus, on Actos prior to admission.  Expected hyperglycemia with tube feedings.  Continue CBGs and sliding scale.  -Disposition-planning for eventual discharge to home with home health nursing, PT, and OT.  Shower stool and rolling walker have been ordered along with a hospital bed which apparently has not been approved yet. He will continue with nocturnal tube feeding by way of the jejunostomy tube at home in order to meet caloric needs.   LOS: 11 days  Antony Odea, PA-C [phone] 04/16/2022

## 2022-04-16 NOTE — Progress Notes (Signed)
Patient with copious amounts of bile coming from J tube site, soaking through bedpad after RN changed dressing 5 hours ago.

## 2022-04-17 ENCOUNTER — Inpatient Hospital Stay (HOSPITAL_COMMUNITY): Payer: No Typology Code available for payment source

## 2022-04-17 LAB — CBC WITH DIFFERENTIAL/PLATELET
Abs Immature Granulocytes: 0.17 10*3/uL — ABNORMAL HIGH (ref 0.00–0.07)
Basophils Absolute: 0 10*3/uL (ref 0.0–0.1)
Basophils Relative: 0 %
Eosinophils Absolute: 0 10*3/uL (ref 0.0–0.5)
Eosinophils Relative: 0 %
HCT: 23.3 % — ABNORMAL LOW (ref 39.0–52.0)
Hemoglobin: 7.6 g/dL — ABNORMAL LOW (ref 13.0–17.0)
Immature Granulocytes: 1 %
Lymphocytes Relative: 2 %
Lymphs Abs: 0.3 10*3/uL — ABNORMAL LOW (ref 0.7–4.0)
MCH: 30.8 pg (ref 26.0–34.0)
MCHC: 32.6 g/dL (ref 30.0–36.0)
MCV: 94.3 fL (ref 80.0–100.0)
Monocytes Absolute: 0.8 10*3/uL (ref 0.1–1.0)
Monocytes Relative: 6 %
Neutro Abs: 12.1 10*3/uL — ABNORMAL HIGH (ref 1.7–7.7)
Neutrophils Relative %: 91 %
Platelets: 289 10*3/uL (ref 150–400)
RBC: 2.47 MIL/uL — ABNORMAL LOW (ref 4.22–5.81)
RDW: 13.3 % (ref 11.5–15.5)
WBC: 13.3 10*3/uL — ABNORMAL HIGH (ref 4.0–10.5)
nRBC: 0 % (ref 0.0–0.2)

## 2022-04-17 LAB — BASIC METABOLIC PANEL
Anion gap: 4 — ABNORMAL LOW (ref 5–15)
BUN: 29 mg/dL — ABNORMAL HIGH (ref 8–23)
CO2: 27 mmol/L (ref 22–32)
Calcium: 9.9 mg/dL (ref 8.9–10.3)
Chloride: 101 mmol/L (ref 98–111)
Creatinine, Ser: 1.04 mg/dL (ref 0.61–1.24)
GFR, Estimated: >60 mL/min (ref >60)
Glucose, Bld: 259 mg/dL — ABNORMAL HIGH (ref 70–99)
Potassium: 4.3 mmol/L (ref 3.5–5.1)
Sodium: 132 mmol/L — ABNORMAL LOW (ref 135–145)

## 2022-04-17 LAB — GLUCOSE, CAPILLARY
Glucose-Capillary: 141 mg/dL — ABNORMAL HIGH (ref 70–99)
Glucose-Capillary: 155 mg/dL — ABNORMAL HIGH (ref 70–99)
Glucose-Capillary: 199 mg/dL — ABNORMAL HIGH (ref 70–99)
Glucose-Capillary: 245 mg/dL — ABNORMAL HIGH (ref 70–99)
Glucose-Capillary: 247 mg/dL — ABNORMAL HIGH (ref 70–99)
Glucose-Capillary: 301 mg/dL — ABNORMAL HIGH (ref 70–99)

## 2022-04-17 MED ORDER — PIPERACILLIN-TAZOBACTAM 3.375 G IVPB
3.3750 g | Freq: Three times a day (TID) | INTRAVENOUS | Status: DC
  Administered 2022-04-17 – 2022-04-20 (×10): 3.375 g via INTRAVENOUS
  Filled 2022-04-17 (×13): qty 50

## 2022-04-17 MED ORDER — DOCUSATE SODIUM 50 MG/5ML PO LIQD
100.0000 mg | Freq: Two times a day (BID) | ORAL | Status: DC
  Administered 2022-04-17 – 2022-04-25 (×8): 100 mg
  Filled 2022-04-17 (×15): qty 10

## 2022-04-17 MED ORDER — ACETAMINOPHEN 500 MG PO TABS
1000.0000 mg | ORAL_TABLET | Freq: Four times a day (QID) | ORAL | Status: DC | PRN
  Administered 2022-04-17 – 2022-04-19 (×3): 1000 mg
  Filled 2022-04-17 (×4): qty 2

## 2022-04-17 MED ORDER — ACETAMINOPHEN 160 MG/5ML PO SOLN
ORAL | Status: AC
  Filled 2022-04-17: qty 40.6

## 2022-04-17 MED ORDER — SODIUM CHLORIDE 0.9 % IV SOLN: INTRAVENOUS | Status: DC

## 2022-04-17 NOTE — Progress Notes (Signed)
[address]       [place] [address]             [phone]      12 Days Post-Op Procedure(s) (LRB): XI ROBOTIC ASSISTED IVOR LEWIS ESOPHAGECTOMY (N/A) XI ROBOTIC ASSISTED JEJUNOSTOMY TUBE PLACEMENT (N/A) ESOPHAGOGASTRODUODENOSCOPY (EGD) (N/A) NODE DISSECTION INTERCOSTAL NERVE BLOCK Subjective: Feels fair to poorly, spiked fever overnight  Objective: Vital signs in last 24 hours: Temp:  [98.5 F (36.9 C)-103 F (39.4 C)] 98.9 F (37.2 C) (10/09 0409) Pulse Rate:  [83-125] 101 (10/09 0600) Cardiac Rhythm: Sinus tachycardia;Bundle branch block;Heart block (10/09 0715) Resp:  [17-39] 35 (10/09 0600) BP: (122-156)/(49-93) 151/93 (10/09 0600) SpO2:  [93 %-95 %] 95 % (10/09 0409) Weight:  [147.1 kg] 147.1 kg (10/09 0442)  Hemodynamic parameters for last 24 hours:    Intake/Output from previous day: 10/08 0701 - 10/09 0700 In: 780 [P.O.:540; I.V.:39.3; IV Piggyback:0.7] Out: 800 [Urine:800] Intake/Output this shift: No intake/output data recorded.  General appearance: alert, cooperative, fatigued, and no distress Heart: regular rate and rhythm and tachy Lungs: some upper airway ronchi Abdomen: soft, obese , non tender Extremities: trace edema Wound: erethema surrounding J tube site, some purulence  Lab Results: Recent Labs    04/15/22 0420  WBC 9.0  HGB 7.6*  HCT 23.8*  PLT 226   BMET:  Recent Labs    04/15/22 0420  NA 133*  K 4.0  CL 96*  CO2 29  GLUCOSE 249*  BUN 33*  CREATININE 1.08  CALCIUM 10.5*    PT/INR: No results for input(s): "LABPROT", "INR" in the last 72 hours. ABG    Component Value Date/Time   PHART 7.41 04/06/2022 0332   HCO3 24.1 04/06/2022 0332   TCO2 28 11/13/2021 2122   ACIDBASEDEF 0.3 04/06/2022 0332   O2SAT 98 04/06/2022 0332   CBG (last 3)  Recent Labs    04/16/22 2016 04/17/22 0002 04/17/22 0431  GLUCAP 122* 155* 247*    Meds Scheduled Meds:  Chlorhexidine Gluconate Cloth  6 each Topical  Daily   docusate  100 mg Oral BID   enoxaparin (LOVENOX) injection  75 mg Subcutaneous Q24H   feeding supplement  237 mL Oral TID BM   feeding supplement (OSMOLITE 1.5 CAL)  1,000 mL Per Tube Q24H   feeding supplement (PROSource TF20)  60 mL Per Tube BID   free water  200 mL Per Tube Q4H   Gerhardt's butt cream   Topical BID   insulin aspart  0-24 Units Subcutaneous Q4H   olopatadine  1 drop Both Eyes BID   pantoprazole (PROTONIX) IV  40 mg Intravenous Q12H   sodium chloride flush  10-40 mL Intravenous Q12H   sodium chloride flush  3 mL Intravenous Q12H   Continuous Infusions:  sodium chloride 75 mL/hr at 04/17/22 0228   piperacillin-tazobactam (ZOSYN)  IV 3.375 g (04/17/22 0249)   PRN Meds:.acetaminophen, hydrALAZINE, HYDROcodone-acetaminophen, morphine injection, ondansetron (ZOFRAN) IV, sodium chloride flush, sodium chloride flush  Xrays DG Chest Port 1 View  Result Date: 04/17/2022 CLINICAL DATA:  [id number] with pneumothorax.  Recent esophagectomy. EXAM: PORTABLE CHEST 1 VIEW COMPARISON:  04/14/2022 portable chest FINDINGS: 5:31 a.m. [place] J port catheter again terminates in the distal SVC. The heart is enlarged. No vascular congestion is seen. No significant pleural effusion. Bilateral hilar adenopathy/enlargement is again noted, mediastinal widening and aortic ectasia and tortuosity. Today there is increased opacity in the retrocardiac left lower lobe area which could be  atelectasis, pneumonia or aspiration. The remaining lungs are clear. In all other respects no further changes. IMPRESSION: Increased opacity left base, could be atelectasis or pneumonia. Follow-up PA Lat recommended. Electronically Signed   By: Telford Nab M.D.   On: 04/17/2022 07:37    Recent Results (from the past 240 hour(s))  Aerobic Culture w Gram Stain (superficial specimen)     Status: None   Collection Time: 04/14/22  7:48 AM   Specimen: Other Source; Wound  Result Value Ref Range Status   Specimen  Description OTHER  Final   Special Requests Immunocompromised  Final   Gram Stain   Final    NO WBC SEEN ABUNDANT GRAM NEGATIVE RODS MODERATE GRAM POSITIVE COCCI FEW GRAM POSITIVE RODS    Culture   Final    ABUNDANT KLEBSIELLA PNEUMONIAE ABUNDANT PROTEUS MIRABILIS RARE PEDIOCOCCUS PENTOSACEUS Standardized susceptibility testing for this organism is not available. Performed at [facility], [place] [address] [place], [place] [address]    Report Status 04/16/2022 FINAL  Final   Organism ID, Bacteria KLEBSIELLA PNEUMONIAE  Final   Organism ID, Bacteria PROTEUS MIRABILIS  Final      Susceptibility   Klebsiella pneumoniae - MIC*    AMPICILLIN RESISTANT Resistant     CEFAZOLIN <=4 SENSITIVE Sensitive     CEFEPIME <=0.12 SENSITIVE Sensitive     CEFTAZIDIME <=1 SENSITIVE Sensitive     CEFTRIAXONE <=0.25 SENSITIVE Sensitive     CIPROFLOXACIN <=0.25 SENSITIVE Sensitive     GENTAMICIN <=1 SENSITIVE Sensitive     IMIPENEM <=0.25 SENSITIVE Sensitive     TRIMETH/SULFA <=20 SENSITIVE Sensitive     AMPICILLIN/SULBACTAM 4 SENSITIVE Sensitive     PIP/TAZO <=4 SENSITIVE Sensitive     * ABUNDANT KLEBSIELLA PNEUMONIAE   Proteus mirabilis - MIC*    AMPICILLIN <=2 SENSITIVE Sensitive     CEFAZOLIN <=4 SENSITIVE Sensitive     CEFEPIME <=0.12 SENSITIVE Sensitive     CEFTAZIDIME <=1 SENSITIVE Sensitive     CEFTRIAXONE <=0.25 SENSITIVE Sensitive     CIPROFLOXACIN <=0.25 SENSITIVE Sensitive     GENTAMICIN <=1 SENSITIVE Sensitive     IMIPENEM 2 SENSITIVE Sensitive     TRIMETH/SULFA <=20 SENSITIVE Sensitive     AMPICILLIN/SULBACTAM <=2 SENSITIVE Sensitive     PIP/TAZO <=4 SENSITIVE Sensitive     * ABUNDANT PROTEUS MIRABILIS  Culture, blood (Routine X 2) w Reflex to ID Panel     Status: None (Preliminary result)   Collection Time: 04/14/22  8:27 AM   Specimen: BLOOD  Result Value Ref Range Status   Specimen Description BLOOD PORTA CATH  Final   Special Requests   Final    BOTTLES DRAWN  AEROBIC AND ANAEROBIC Blood Culture adequate volume   Culture   Final    NO GROWTH 2 DAYS Performed at [facility], [address], [place], [place] [address]    Report Status PENDING  Incomplete  Culture, blood (Routine X 2) w Reflex to ID Panel     Status: None (Preliminary result)   Collection Time: 04/14/22  8:30 AM   Specimen: BLOOD  Result Value Ref Range Status   Specimen Description BLOOD RIGHT ANTECUBITAL  Final   Special Requests   Final    BOTTLES DRAWN AEROBIC AND ANAEROBIC Blood Culture adequate volume   Culture   Final    NO GROWTH 2 DAYS Performed at [facility], [place] [address] [place], [place] [address]    Report Status PENDING  Incomplete       Assessment/Plan: S/P Procedure(s) (LRB): XI ROBOTIC ASSISTED IVOR LEWIS ESOPHAGECTOMY (N/A) XI ROBOTIC ASSISTED JEJUNOSTOMY TUBE PLACEMENT (N/A) ESOPHAGOGASTRODUODENOSCOPY (EGD) (N/A) NODE DISSECTION INTERCOSTAL NERVE BLOCK POD#12  1 febrile, Tmax 103, s BP 120's-150's, Tachycardic 2 O2 sats good on 2 liters 3 poor UOP, if accurate 4 no new labs- will order 5 now on Zosyn, may need to broaden abx range, klebsiella and proteus primary - may need to consider abdominal CT scan 6 CXR some increase in Left retrocardiac lower lobe, atx v pneumonia per radiologist , will get PA/Lat in am 7 push rehab and pulm hygiene     LOS: 12 days    [name] PA-C Pager [phone] 04/17/2022

## 2022-04-17 NOTE — Progress Notes (Signed)
Occupational Therapy Treatment Patient Details Name: Keith Moreno MRN: [id number] DOB: 24-Feb-1961 Today's Date: 04/17/2022   History of present illness 61 yo male admitted 9/27 for esophagectomy with J tube placement due to esophageal CA. PMhx: chronic low back pain, esophageal CA   OT comments  Keith Moreno is incrementally progressing, pt with increased temperature and general malaise this date. Per RN request pt limited to EOB this session. Overall he required min A for bed mobility and was indep in grooming and feeding while sitting unsupported. He is min A for sit<>stand and steeping EOB. Pt placed back in supine, bed moved to chair position. OT to continue to follow, POC remains appropriate.    Recommendations for follow up therapy are one component of a multi-disciplinary discharge planning process, led by the attending physician.  Recommendations may be updated based on patient status, additional functional criteria and insurance authorization.    Follow Up Recommendations  Home health OT    Assistance Recommended at Discharge Intermittent Supervision/Assistance  Patient can return home with the following  A little help with walking and/or transfers;A little help with bathing/dressing/bathroom;Assistance with cooking/housework;Assist for transportation;Help with stairs or ramp for entrance   Equipment Recommendations  Other (comment)       Precautions / Restrictions Precautions Precautions: Fall;Other (comment) Precaution Comments: JP drain rt flank, PEG Restrictions Weight Bearing Restrictions: No       Mobility Bed Mobility Overal bed mobility: Needs Assistance Bed Mobility: Rolling, Sidelying to Sit Rolling: Supervision Sidelying to sit: Min assist       General bed mobility comments: min A for trunk elevation    Transfers Overall transfer level: Needs assistance   Transfers: Sit to/from Stand Sit to Stand: Min guard           General transfer comment:  limited to EOB this date per RN request     Balance Overall balance assessment: Needs assistance Sitting-balance support: Feet supported Sitting balance-Leahy Scale: Good     Standing balance support: Bilateral upper extremity supported, Reliant on assistive device for balance Standing balance-Leahy Scale: Poor                             ADL either performed or assessed with clinical judgement   ADL Overall ADL's : Needs assistance/impaired Eating/Feeding: Independent;Sitting Eating/Feeding Details (indicate cue type and reason): EOB for full meal Grooming: Set up;Sitting                               Functional mobility during ADLs: Supervision/safety General ADL Comments: per RN limited to EOB due to high temperature.    Extremity/Trunk Assessment Upper Extremity Assessment Upper Extremity Assessment: Generalized weakness   Lower Extremity Assessment Lower Extremity Assessment: Defer to PT evaluation        Vision   Vision Assessment?: No apparent visual deficits   Perception Perception Perception: Not tested   Praxis Praxis Praxis: Not tested    Cognition Arousal/Alertness: Awake/alert Behavior During Therapy: Flat affect Overall Cognitive Status: Within Functional Limits for tasks assessed                                 General Comments: general malaise, flat affect but following all commands              General Comments VSS on RA  Pertinent Vitals/ Pain       Pain Assessment Pain Assessment: Faces Faces Pain Scale: Hurts a little bit Pain Location: incisional, back, neck Pain Descriptors / Indicators: Aching Pain Intervention(s): Limited activity within patient's tolerance, Monitored during session   Frequency  Min 2X/week        Progress Toward Goals  OT Goals(current goals can now be found in the care plan section)  Progress towards OT goals: Progressing toward goals  Acute Rehab OT  Goals Patient Stated Goal: to feel better OT Goal Formulation: With patient Time For Goal Achievement: 04/25/22 Potential to Achieve Goals: Good ADL Goals Pt Will Perform Grooming: with modified independence;standing Pt Will Perform Lower Body Bathing: with supervision;sit to/from stand Pt Will Perform Lower Body Dressing: with supervision;sit to/from stand Pt Will Transfer to Toilet: ambulating;with modified independence;regular height toilet Additional ADL Goal #1: pt will indep recall at least 2 energy conservation strategies to apply in the home setting  Plan Discharge plan remains appropriate       AM-PAC OT "6 Clicks" Daily Activity     Outcome Measure   Help from another person eating meals?: None Help from another person taking care of personal grooming?: A Little Help from another person toileting, which includes using toliet, bedpan, or urinal?: A Little Help from another person bathing (including washing, rinsing, drying)?: A Lot Help from another person to put on and taking off regular upper body clothing?: A Little Help from another person to put on and taking off regular lower body clothing?: A Lot 6 Click Score: 17    End of Session    OT Visit Diagnosis: Unsteadiness on feet (R26.81);Other abnormalities of gait and mobility (R26.89);Muscle weakness (generalized) (M62.81);Pain   Activity Tolerance Patient tolerated treatment well   Patient Left in chair;with call bell/phone within reach;with nursing/sitter in room;with family/visitor present   Nurse Communication Other (comment)        Time: 7253-6644 OT Time Calculation (min): 18 min  Charges: OT General Charges $OT Visit: 1 Visit OT Treatments $Self Care/Home Management : 8-22 mins   Keith Moreno 04/17/2022, 3:32 PM

## 2022-04-17 NOTE — Progress Notes (Signed)
Mobility Specialist Progress Note    04/17/22 1643  Mobility  Activity Ambulated with assistance to bathroom  Level of Assistance Minimal assist, patient does 75% or more  Assistive Device Front wheel walker  Distance Ambulated (ft) 20 ft (10+10)  Activity Response Tolerated well  Mobility Referral Yes  $Mobility charge 1 Mobility   Pt received in bed and agreeable. C/o being hot. Declined sitting up in chair or on EOB for dinner. Left supine with call bell in reach and wife present.   Hildred Alamin Mobility Specialist

## 2022-04-17 NOTE — Progress Notes (Signed)
   04/17/22 0217  Assess: MEWS Score  Temp 99.9 F (37.7 C)  BP 122/72  MAP (mmHg) 86  Pulse Rate (!) 123  ECG Heart Rate (!) 123  Resp (!) 22  SpO2 93 %  O2 Device Nasal Cannula  Patient Activity (if Appropriate) In bed  O2 Flow Rate (L/min) 2 L/min  Assess: MEWS Score  MEWS Temp 0  MEWS Systolic 0  MEWS Pulse 2  MEWS RR 1  MEWS LOC 0  MEWS Score 3  MEWS Score Color Yellow  Treat  Pain Scale 0-10  Pain Score 7  Pain Type Acute pain  Pain Location Head  2nd Pain Site  Pain Score 6  Pain Type Chronic pain  Pain Location Back  Assess: SIRS CRITERIA  SIRS Temperature  0  SIRS Pulse 1  SIRS Respirations  1  SIRS WBC 1  SIRS Score Sum  3

## 2022-04-17 NOTE — Plan of Care (Signed)
Problem: Education: Goal: Knowledge of General Education information will improve Description: Including pain rating scale, medication(s)/side effects and non-pharmacologic comfort measures 04/17/2022 0136 by Shanon Ace, RN Outcome: Progressing 04/17/2022 0110 by Shanon Ace, RN Outcome: Progressing   Problem: Health Behavior/Discharge Planning: Goal: Ability to manage health-related needs will improve 04/17/2022 0136 by Shanon Ace, RN Outcome: Progressing 04/17/2022 0110 by Shanon Ace, RN Outcome: Progressing   Problem: Clinical Measurements: Goal: Ability to maintain clinical measurements within normal limits will improve 04/17/2022 0136 by Shanon Ace, RN Outcome: Progressing 04/17/2022 0110 by Shanon Ace, RN Outcome: Progressing Goal: Will remain free from infection 04/17/2022 0136 by Shanon Ace, RN Outcome: Progressing 04/17/2022 0110 by Shanon Ace, RN Outcome: Progressing Goal: Diagnostic test results will improve 04/17/2022 0136 by Shanon Ace, RN Outcome: Progressing 04/17/2022 0110 by Shanon Ace, RN Outcome: Progressing Goal: Respiratory complications will improve 04/17/2022 0136 by Shanon Ace, RN Outcome: Progressing 04/17/2022 0110 by Shanon Ace, RN Outcome: Progressing Goal: Cardiovascular complication will be avoided 04/17/2022 0136 by Shanon Ace, RN Outcome: Progressing 04/17/2022 0110 by Shanon Ace, RN Outcome: Progressing   Problem: Activity: Goal: Risk for activity intolerance will decrease 04/17/2022 0136 by Shanon Ace, RN Outcome: Progressing 04/17/2022 0110 by Shanon Ace, RN Outcome: Progressing   Problem: Nutrition: Goal: Adequate nutrition will be maintained 04/17/2022 0136 by Shanon Ace, RN Outcome: Progressing 04/17/2022 0110 by Shanon Ace, RN Outcome: Progressing   Problem: Coping: Goal: Level of anxiety will decrease 04/17/2022 0136 by Shanon Ace, RN Outcome: Progressing 04/17/2022 0110 by Shanon Ace, RN Outcome: Progressing   Problem: Elimination: Goal: Will not experience complications related to bowel motility 04/17/2022 0136 by Shanon Ace, RN Outcome: Progressing 04/17/2022 0110 by Shanon Ace, RN Outcome: Progressing Goal: Will not experience complications related to urinary retention 04/17/2022 0136 by Shanon Ace, RN Outcome: Progressing 04/17/2022 0110 by Shanon Ace, RN Outcome: Progressing   Problem: Pain Managment: Goal: General experience of comfort will improve 04/17/2022 0136 by Shanon Ace, RN Outcome: Progressing 04/17/2022 0110 by Shanon Ace, RN Outcome: Progressing   Problem: Safety: Goal: Ability to remain free from injury will improve 04/17/2022 0136 by Shanon Ace, RN Outcome: Progressing 04/17/2022 0110 by Shanon Ace, RN Outcome: Progressing   Problem: Skin Integrity: Goal: Risk for impaired skin integrity will decrease 04/17/2022 0136 by Shanon Ace, RN Outcome: Progressing 04/17/2022 0110 by Shanon Ace, RN Outcome: Progressing   Problem: Education: Goal: Knowledge of the prescribed therapeutic regimen will improve 04/17/2022 0136 by Shanon Ace, RN Outcome: Progressing 04/17/2022 0110 by Shanon Ace, RN Outcome: Progressing   Problem: Bowel/Gastric: Goal: Gastrointestinal status for postoperative course will improve 04/17/2022 0136 by Shanon Ace, RN Outcome: Progressing 04/17/2022 0110 by Shanon Ace, RN Outcome: Progressing   Problem: Nutritional: Goal: Ability to achieve adequate nutritional intake will improve 04/17/2022 0136 by Shanon Ace, RN Outcome: Progressing 04/17/2022 0110 by Shanon Ace, RN Outcome: Progressing   Problem: Clinical Measurements: Goal: Postoperative complications will be avoided or minimized 04/17/2022 0136 by Shanon Ace, RN Outcome: Progressing 04/17/2022 0110 by Shanon Ace, RN Outcome: Progressing   Problem: Respiratory: Goal: Ability to maintain a clear airway  will improve 04/17/2022 0136 by Shanon Ace, RN Outcome: Progressing 04/17/2022 0110 by Shanon Ace, RN Outcome: Progressing   Problem: Skin Integrity: Goal: Demonstration of wound healing without infection will improve 04/17/2022 0136 by Shanon Ace, RN Outcome: Progressing 04/17/2022 0110 by Shanon Ace, RN Outcome: Progressing   Problem: Safety: Goal: Non-violent Restraint(s) 04/17/2022 0136 by Shanon Ace, RN Outcome: Progressing 04/17/2022 0110 by Theresia Lo,  Vinnie Level, RN Outcome: Progressing

## 2022-04-17 NOTE — Progress Notes (Addendum)
Pharmacy Antibiotic Note  Keith Moreno is a 61 y.o. male admitted on 04/05/2022 with  s/p esophagectomy .  Pharmacy has been consulted for Zosyn dosing for fever up to 103. Last WBC WNL. Last Scr ok.   Plan: Zosyn 3.375G IV q8h to be infused over 4 hours Trend WBC, temp, renal function  F/U infectious work-up  Height: 6' (182.9 cm) Weight: (!) 147.8 kg (325 lb 13.4 oz) IBW/kg (Calculated) : 77.6  Temp (24hrs), Avg:99.6 F (37.6 C), Min:98.5 F (36.9 C), Max:103 F (39.4 C)  Recent Labs  Lab 04/11/22 0900 04/13/22 0507 04/14/22 0500 04/15/22 0420  WBC 10.2 7.2 8.5 9.0  CREATININE 1.04  --  0.92 1.08    Estimated Creatinine Clearance: 107.4 mL/min (by C-G formula based on SCr of 1.08 mg/dL).    Allergies  Allergen Reactions   Procaine     Other reaction(s): Other, Respiratory Distress   Chocolate     Hyperactivity, respiratory distress   Other     Novocaine- respiratory distress    Narda Bonds, PharmD, BCPS Clinical Pharmacist Phone: [phone]

## 2022-04-17 NOTE — Progress Notes (Signed)
   04/17/22 0300  Assess: MEWS Score  Temp (!) 100.7 F (38.2 C)  BP 133/64  MAP (mmHg) 84  Pulse Rate (!) 110  ECG Heart Rate (!) 110  Resp (!) 33  Level of Consciousness Alert  SpO2 93 %  O2 Device Nasal Cannula  Patient Activity (if Appropriate) In bed  O2 Flow Rate (L/min) 2 L/min  Assess: MEWS Score  MEWS Temp 1  MEWS Systolic 0  MEWS Pulse 1  MEWS RR 2  MEWS LOC 0  MEWS Score 4  MEWS Score Color Red  Treat  Pain Scale 0-10  Pain Score 3  2nd Pain Site  Pain Score 5  Assess: SIRS CRITERIA  SIRS Temperature  0  SIRS Pulse 1  SIRS Respirations  1  SIRS WBC 1  SIRS Score Sum  3   Pt still not feeling well, says he feels funny, continue to monitor Pt and VS.

## 2022-04-17 NOTE — Plan of Care (Signed)
  Problem: Education: Goal: Knowledge of General Education information will improve Description: Including pain rating scale, medication(s)/side effects and non-pharmacologic comfort measures Outcome: Progressing   Problem: Health Behavior/Discharge Planning: Goal: Ability to manage health-related needs will improve Outcome: Progressing   Problem: Clinical Measurements: Goal: Ability to maintain clinical measurements within normal limits will improve Outcome: Progressing Goal: Will remain free from infection Outcome: Progressing Goal: Diagnostic test results will improve Outcome: Progressing Goal: Respiratory complications will improve Outcome: Progressing Goal: Cardiovascular complication will be avoided Outcome: Progressing   Problem: Activity: Goal: Risk for activity intolerance will decrease Outcome: Progressing   Problem: Nutrition: Goal: Adequate nutrition will be maintained Outcome: Progressing   Problem: Coping: Goal: Level of anxiety will decrease Outcome: Progressing   Problem: Elimination: Goal: Will not experience complications related to bowel motility Outcome: Progressing Goal: Will not experience complications related to urinary retention Outcome: Progressing   Problem: Pain Managment: Goal: General experience of comfort will improve Outcome: Progressing   Problem: Safety: Goal: Ability to remain free from injury will improve Outcome: Progressing   Problem: Skin Integrity: Goal: Risk for impaired skin integrity will decrease Outcome: Progressing   Problem: Education: Goal: Knowledge of the prescribed therapeutic regimen will improve Outcome: Progressing   Problem: Bowel/Gastric: Goal: Gastrointestinal status for postoperative course will improve Outcome: Progressing   Problem: Nutritional: Goal: Ability to achieve adequate nutritional intake will improve Outcome: Progressing   Problem: Clinical Measurements: Goal: Postoperative  complications will be avoided or minimized Outcome: Progressing   Problem: Respiratory: Goal: Ability to maintain a clear airway will improve Outcome: Progressing   Problem: Skin Integrity: Goal: Demonstration of wound healing without infection will improve Outcome: Progressing   Problem: Safety: Goal: Non-violent Restraint(s) Outcome: Progressing

## 2022-04-17 NOTE — Progress Notes (Signed)
   04/17/22 0751  Assess: MEWS Score  Temp (!) 103.1 F (39.5 C)  BP (!) 160/69  MAP (mmHg) 93  Pulse Rate (!) 111  ECG Heart Rate (!) 111  Resp (!) 22  SpO2 94 %  Assess: MEWS Score  MEWS Temp 2  MEWS Systolic 0  MEWS Pulse 2  MEWS RR 1  MEWS LOC 0  MEWS Score 5  MEWS Score Color Red  Assess: if the MEWS score is Yellow or Red  Were vital signs taken at a resting state? Yes  Focused Assessment No change from prior assessment  Does the patient meet 2 or more of the SIRS criteria? Yes  Does the patient have a confirmed or suspected source of infection? Yes  Provider and Rapid Response Notified? No  MEWS guidelines implemented *See Row Information* No, previously red, continue vital signs every 4 hours  Take Vital Signs  Increase Vital Sign Frequency  Red: Q 1hr X 4 then Q 4hr X 4, if remains red, continue Q 4hrs  Escalate  MEWS: Escalate Red: discuss with charge nurse/RN and provider, consider discussing with RRT  Notify: Charge Nurse/RN  Name of Charge Nurse/RN Notified creshenda Rn  Date Charge Nurse/RN Notified 04/17/22  Time Charge Nurse/RN Notified 0800  Notify: Provider  Provider Name/Title wayne gold  Date Provider Notified 04/17/22  Time Provider Notified [phone]  Method of Notification Face-to-face  Notification Reason Critical result  Provider response At bedside  Date of Provider Response 04/17/22  Time of Provider Response [phone]  Document  Patient Outcome Not stable and remains on department  Progress note created (see row info) Yes  Assess: SIRS CRITERIA  SIRS Temperature  1  SIRS Pulse 1  SIRS Respirations  1  SIRS WBC 1  SIRS Score Sum  4

## 2022-04-17 NOTE — Progress Notes (Signed)
Dressing  on j tube site  saturated with yellowish secretions. Skin surrounding the tube is reddened. Cleansed with saline and gerhardt cream applied. Continue to monitor.

## 2022-04-17 NOTE — Significant Event (Signed)
Rapid Response Event Note   Reason for Call :  T-103, HR-120s, RR-30s.  Initial Focused Assessment:  Pt lying in bed with eyes open, alert and oriented. He denies CP/SOB. His breathing is tachypneic. Lungs clear, decreased in the bases. ABD large, distended, soft. J tube with bilious foul smelling drainage. Skin hot to touch.   T-103, HR-125, BP-135/85, RR-39, SpO2-95% on RA.   Interventions:  Tylenol '1000mg'$  given PO(previous prn order) Zosyn per pharmacy Plan of Care:  Allow tylenol time to work. Zosyn ordered. Continue to monitor pt. Call RRT if further assistance needed.    Event Summary:   MD Notified: Michael Litter, [place] notified by bedside RN Call Time:0140 Arrival [phone] End ELFY:[id number]  Dillard Essex, RN

## 2022-04-17 NOTE — Progress Notes (Signed)
   04/17/22 0409  Assess: MEWS Score  Temp 98.9 F (37.2 C)  BP (!) 140/91  MAP (mmHg) 107  Pulse Rate (!) 117  ECG Heart Rate (!) 117  Resp 19  Level of Consciousness Alert  SpO2 95 %  O2 Device Nasal Cannula  Patient Activity (if Appropriate) In bed  O2 Flow Rate (L/min) 2 L/min  Assess: MEWS Score  MEWS Temp 0  MEWS Systolic 0  MEWS Pulse 2  MEWS RR 0  MEWS LOC 0  MEWS Score 2  MEWS Score Color Yellow  Treat  Pain Scale 0-10  Pain Score 6  Pain Location Head  2nd Pain Site  Pain Score 6  Pain Location Back  Assess: SIRS CRITERIA  SIRS Temperature  0  SIRS Pulse 1  SIRS Respirations  0  SIRS WBC 1  SIRS Score Sum  2   Pt up to [facility], very shaky, desated into 70's and low 80's on ambulation, increased O2 to 4L to assist with oxygenation demand.  He was not able to have BM, just needed to move out of bed.

## 2022-04-17 NOTE — Progress Notes (Signed)
   04/17/22 0149  Assess: MEWS Score  Temp (!) 103 F (39.4 C)  BP 135/85  MAP (mmHg) 98  Pulse Rate (!) 125  ECG Heart Rate (!) 118  Resp (!) 39  SpO2 95 %  O2 Device Nasal Cannula  Patient Activity (if Appropriate) In bed  O2 Flow Rate (L/min) 2 L/min  Assess: MEWS Score  MEWS Temp 2  MEWS Systolic 0  MEWS Pulse 2  MEWS RR 3  MEWS LOC 0  MEWS Score 7  MEWS Score Color Red  Treat  Pain Scale 0-10  Pain Score 0  Assess: SIRS CRITERIA  SIRS Temperature  1  SIRS Pulse 1  SIRS Respirations  1  SIRS WBC 1  SIRS Score Sum  4    Pt not feeling well, notified RRT, to give Pt tylenol for fever and will notify team of fever and discuss culture results with them

## 2022-04-18 ENCOUNTER — Inpatient Hospital Stay (HOSPITAL_COMMUNITY): Payer: No Typology Code available for payment source

## 2022-04-18 LAB — GLUCOSE, CAPILLARY
Glucose-Capillary: 136 mg/dL — ABNORMAL HIGH (ref 70–99)
Glucose-Capillary: 145 mg/dL — ABNORMAL HIGH (ref 70–99)
Glucose-Capillary: 171 mg/dL — ABNORMAL HIGH (ref 70–99)
Glucose-Capillary: 197 mg/dL — ABNORMAL HIGH (ref 70–99)
Glucose-Capillary: 206 mg/dL — ABNORMAL HIGH (ref 70–99)
Glucose-Capillary: 215 mg/dL — ABNORMAL HIGH (ref 70–99)
Glucose-Capillary: 238 mg/dL — ABNORMAL HIGH (ref 70–99)

## 2022-04-18 MED ORDER — ENSURE ENLIVE PO LIQD
237.0000 mL | Freq: Two times a day (BID) | ORAL | Status: DC
  Administered 2022-04-19 – 2022-04-20 (×2): 237 mL via ORAL

## 2022-04-18 MED ORDER — LEVALBUTEROL HCL 0.63 MG/3ML IN NEBU
0.6300 mg | INHALATION_SOLUTION | Freq: Three times a day (TID) | RESPIRATORY_TRACT | Status: DC
  Administered 2022-04-18 – 2022-04-19 (×4): 0.63 mg via RESPIRATORY_TRACT
  Filled 2022-04-18 (×5): qty 3

## 2022-04-18 MED ORDER — IOHEXOL 350 MG/ML SOLN
75.0000 mL | Freq: Once | INTRAVENOUS | Status: AC | PRN
  Administered 2022-04-18: 75 mL via INTRAVENOUS

## 2022-04-18 NOTE — Inpatient Diabetes Management (Signed)
Inpatient Diabetes Program Recommendations  AACE/ADA: New Consensus Statement on Inpatient Glycemic Control (2015)  Target Ranges:  Prepandial:   less than 140 mg/dL      Peak postprandial:   less than 180 mg/dL (1-2 hours)      Critically ill patients:  140 - 180 mg/dL   Lab Results  Component Value Date   GLUCAP 215 (H) 04/18/2022   HGBA1C 5.1 04/03/2022    Review of Glycemic Control  Latest Reference Range & Units 04/17/22 07:57 04/17/22 12:00 04/17/22 17:23 04/17/22 19:59 04/17/22 23:51 04/18/22 04:31 04/18/22 07:37  Glucose-Capillary 70 - 99 mg/dL 301 (H) 245 (H) 199 (H) 141 (H) 206 (H) 197 (H) 215 (H)   DM 2, Alogliptin 12.5 mg qhs, Actos 30 mg Daily Current orders for Inpatient glycemic control: Novolog 0-24 units Q4 hours  A1c 5.1% on 9/25 Osmolite 110 ml/hour Ensure Enlive tid between meals  Inpatient Diabetes Program Recommendations:    -  Start Novolog 3-4 units Q4 Tube Feed Coverage  Thanks,  Tama Headings RN, MSN, BC-ADM Inpatient Diabetes Coordinator Team Pager [phone] (8a-5p)

## 2022-04-18 NOTE — Progress Notes (Signed)
PT Cancellation Note  Patient Details Name: Keith Moreno MRN: [id number] DOB: March 12, 1961   Cancelled Treatment:    Reason Eval/Treat Not Completed: Medical issues which prohibited therapy (hold per RN, pt tachypneic and with increased drainage from Jtube site.) Will check back as schedule allows to continue with PT POC.  [name]. PTA Acute Rehabilitation Services Office: [place] 04/18/2022, 10:56 AM

## 2022-04-18 NOTE — Progress Notes (Signed)
Mobility Specialist Progress Note    04/18/22 1617  Mobility  Activity Ambulated with assistance in hallway  Level of Assistance Minimal assist, patient does 75% or more  Assistive Device Four wheel walker  Distance Ambulated (ft) 150 ft (75+75)  Activity Response Tolerated well  Mobility Referral Yes  $Mobility charge 1 Mobility   Pre-Mobility: 90 HR, 98% SpO2 Post-Mobility: 112 HR  Pt received in bed and agreeable. No complaints. Took x1 seated rest break. Encouraged pursed lip breathing. SpO2 >/=90% on RA. Returned to BR to attempt BM. Encouraged to pull string for assistance. RN aware.    Hildred Alamin Mobility Specialist

## 2022-04-18 NOTE — Progress Notes (Signed)
Nutrition Follow-up  DOCUMENTATION CODES:   Non-severe (moderate) malnutrition in context of chronic illness  INTERVENTION:   Continue cyclic/nocturnal tube feeds via J-tube: - Osmolite 1.5 @ 110 ml/hr x 12 hours from 2000 to 0800 (total of 1320 ml nightly) - PROSource TF20 60 ml BID - Free water flushes per MD/PA, currently 200 ml q 4 hours  Cyclic/nocturnal tube feeding regimen with free water flushes provides 2140 kcal, 123 grams of protein, and 2206 ml of H2O (meets 86% of minimum kcal needs and 100% of minimum protein needs).  - Ensure Enlive po BID, each supplement provides 350 kcal and 20 grams of protein  - Pt can consume Ensure Max supplements from home, each supplement provides 150 kcal and 30 grams of protein  - Encourage PO intake  NUTRITION DIAGNOSIS:   Moderate Malnutrition related to chronic illness (esophageal cancer) as evidenced by mild muscle depletion, percent weight loss (9.2% weight loss in 5 months).  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 Pt did not complete all of his chemotherapy due to neutropenia but did complete radiation on 02/02/22.  09/27 - s/p Ivor Lewis esophagectomy, J-tube placement 10/02 - s/p swallow study showing no leak, NG tube removed, diet advanced to dysphagia 1 with thin liquids 10/07 - diet advanced to dysphagia 3  Spoke with pt and wife at bedside. Pt keeps falling asleep during RD conversation. Noted CT  chest/abdomen/pelvis with contrast has been ordered.  Pt reports liking the dysphagia 3 diet but states that some of the meats are harder to consume due to missing teeth. Pt's wife reports that pt consumed 100% of the eggs on his breakfast tray this morning. Per pt's wife, pt is very concerned about his CBG's and is avoiding carbohydrates, including Ensure supplements, because of this. Educated pt about the importance of overall kcal intake and carbohydrate intake. Explained that CBG's may be elevated at times due to nocturnal tube feeds and that SSI is ordered q 4 hours. Pt willing to try drinking Ensure supplements again. Pt's wife shares that family member brought Ensure Max from home. Encouraged pt to try these as well and see what he prefers.  Admit weight: 150.3 kg Current weight: 149.5 kg  Pt with non-pitting edema to BUE and mild pitting edema to BLE.  Meal Completion: 0-100% x last 8 meals  Medications reviewed and include: colace 100 mg BID, Ensure Enlive TID, SSI q 4 hours, IV protonix, IV abx IVF: NS @ 75 ml/hr  Labs reviewed: sodium 132, BUN 29, WBC 13.3, hemoglobin 7.6 CBG's: 141-245 x 24 hours  UOP: 2050 ml x 24 hours R chest JP drain: 10 ml x 24 hours I/O's: +7.0 L since admit  Diet Order:   Diet Order             DIET DYS 3 Room service appropriate? Yes; Fluid consistency: Thin  Diet effective now                   EDUCATION NEEDS:  Education needs have been addressed  Skin:  Skin Assessment: Reviewed RN Assessment (incisions to chest and abdomen)  Last BM:  04/17/22  Height:   Ht Readings from Last 1 Encounters:  04/05/22 6' (1.829 m)    Weight:   Wt Readings from Last 1 Encounters:  04/18/22 (!) 149.5 kg    Ideal Body Weight:  80.9 kg  BMI:  Body mass index is 44.7 kg/m.  Estimated Nutritional Needs:   Kcal:  2500-2700  Protein:  120-140 grams  Fluid:  >/= 2.2 L    Gustavus Bryant, MS, RD, LDN Inpatient Clinical Dietitian Please  see AMiON for contact information.

## 2022-04-18 NOTE — Progress Notes (Addendum)
Patient with complaints of feeling like he is not "getting enough air".  Lungs clear and diminished at bases. RT assessed patient.  MD notified. Verbal orders to stop running fluids. VSS.  RR elevated at 28.  Pt RR have been elevated.   0130-pt feeling improvements since fluids stopped and sitting up in the recliner.

## 2022-04-18 NOTE — Plan of Care (Signed)
  Problem: Education: Goal: Knowledge of General Education information will improve Description: Including pain rating scale, medication(s)/side effects and non-pharmacologic comfort measures Outcome: Progressing   Problem: Health Behavior/Discharge Planning: Goal: Ability to manage health-related needs will improve Outcome: Progressing   Problem: Clinical Measurements: Goal: Ability to maintain clinical measurements within normal limits will improve Outcome: Progressing Goal: Will remain free from infection Outcome: Progressing Goal: Cardiovascular complication will be avoided Outcome: Progressing   Problem: Activity: Goal: Risk for activity intolerance will decrease Outcome: Progressing   Problem: Coping: Goal: Level of anxiety will decrease Outcome: Progressing

## 2022-04-18 NOTE — Progress Notes (Addendum)
[address]       ,[place] [address]             [phone]      13 Days Post-Op Procedure(s) (LRB): XI ROBOTIC ASSISTED IVOR LEWIS ESOPHAGECTOMY (N/A) XI ROBOTIC ASSISTED JEJUNOSTOMY TUBE PLACEMENT (N/A) ESOPHAGOGASTRODUODENOSCOPY (EGD) (N/A) NODE DISSECTION INTERCOSTAL NERVE BLOCK Subjective: Some SOB, feels weak  Objective: Vital signs in last 24 hours: Temp:  [97.8 F (36.6 C)-103.1 F (39.5 C)] 99.3 F (37.4 C) (10/10 0738) Pulse Rate:  [84-111] 105 (10/10 0738) Cardiac Rhythm: Sinus tachycardia;Heart block (10/10 0711) Resp:  [22-41] 36 (10/10 0738) BP: (90-160)/(60-102) 125/102 (10/10 0738) SpO2:  [90 %-95 %] 93 % (10/10 0738) Weight:  [149.5 kg] 149.5 kg (10/10 0441)  Hemodynamic parameters for last 24 hours:    Intake/Output from previous day: 10/09 0701 - 10/10 0700 In: 4333.6 [P.O.:120; I.V.:1460.4; NG/GT:2453.2; IV Piggyback:100] Out: 2060 [Urine:2050; Drains:10] Intake/Output this shift: No intake/output data recorded.  General appearance: alert, cooperative, and no distress Heart: regular rate and rhythm and tachy Lungs: minor upper airway wheeze Abdomen: minor tenderness around the J tube Extremities: + edema Wound: erethema around J tube site  Lab Results: Recent Labs    04/17/22 1104  WBC 13.3*  HGB 7.6*  HCT 23.3*  PLT 289   BMET:  Recent Labs    04/17/22 1104  NA 132*  K 4.3  CL 101  CO2 27  GLUCOSE 259*  BUN 29*  CREATININE 1.04  CALCIUM 9.9    PT/INR: No results for input(s): "LABPROT", "INR" in the last 72 hours. ABG    Component Value Date/Time   PHART 7.41 04/06/2022 0332   HCO3 24.1 04/06/2022 0332   TCO2 28 11/13/2021 2122   ACIDBASEDEF 0.3 04/06/2022 0332   O2SAT 98 04/06/2022 0332   CBG (last 3)  Recent Labs    04/17/22 2351 04/18/22 0431 04/18/22 0737  GLUCAP 206* 197* 215*    Meds Scheduled Meds:  Chlorhexidine Gluconate Cloth  6 each Topical Daily   docusate  100 mg Per  Tube BID   enoxaparin (LOVENOX) injection  75 mg Subcutaneous Q24H   feeding supplement  237 mL Oral TID BM   feeding supplement (OSMOLITE 1.5 CAL)  1,000 mL Per Tube Q24H   feeding supplement (PROSource TF20)  60 mL Per Tube BID   free water  200 mL Per Tube Q4H   Gerhardt's butt cream   Topical BID   insulin aspart  0-24 Units Subcutaneous Q4H   olopatadine  1 drop Both Eyes BID   pantoprazole (PROTONIX) IV  40 mg Intravenous Q12H   sodium chloride flush  10-40 mL Intravenous Q12H   sodium chloride flush  3 mL Intravenous Q12H   Continuous Infusions:  sodium chloride 75 mL/hr at 04/18/22 0531   piperacillin-tazobactam (ZOSYN)  IV 3.375 g (04/18/22 0530)   PRN Meds:.acetaminophen, hydrALAZINE, HYDROcodone-acetaminophen, morphine injection, ondansetron (ZOFRAN) IV, sodium chloride flush, sodium chloride flush  Xrays DG Chest Port 1 View  Result Date: 04/17/2022 CLINICAL DATA:  [id number] with pneumothorax.  Recent esophagectomy. EXAM: PORTABLE CHEST 1 VIEW COMPARISON:  04/14/2022 portable chest FINDINGS: 5:31 a.m. [place]ona J port catheter again terminates in the distal SVC. The heart is enlarged. No vascular congestion is seen. No significant pleural effusion. Bilateral hilar adenopathy/enlargement is again noted, mediastinal widening and aortic ectasia and tortuosity. Today there is increased opacity in the retrocardiac left lower lobe area which could be atelectasis, pneumonia or aspiration.  The remaining lungs are clear. In all other respects no further changes. IMPRESSION: Increased opacity left base, could be atelectasis or pneumonia. Follow-up PA Lat recommended. Electronically Signed   By: Telford Nab M.D.   On: 04/17/2022 07:37   Recent Results (from the past 240 hour(s))  Aerobic Culture w Gram Stain (superficial specimen)     Status: None   Collection Time: 04/14/22  7:48 AM   Specimen: Other Source; Wound  Result Value Ref Range Status   Specimen Description OTHER  Final    Special Requests Immunocompromised  Final   Gram Stain   Final    NO WBC SEEN ABUNDANT GRAM NEGATIVE RODS MODERATE GRAM POSITIVE COCCI FEW GRAM POSITIVE RODS    Culture   Final    ABUNDANT KLEBSIELLA PNEUMONIAE ABUNDANT PROTEUS MIRABILIS RARE PEDIOCOCCUS PENTOSACEUS Standardized susceptibility testing for this organism is not available. Performed at [facility], [place] [address] [place], [place] [address]    Report Status 04/16/2022 FINAL  Final   Organism ID, Bacteria KLEBSIELLA PNEUMONIAE  Final   Organism ID, Bacteria PROTEUS MIRABILIS  Final      Susceptibility   Klebsiella pneumoniae - MIC*    AMPICILLIN RESISTANT Resistant     CEFAZOLIN <=4 SENSITIVE Sensitive     CEFEPIME <=0.12 SENSITIVE Sensitive     CEFTAZIDIME <=1 SENSITIVE Sensitive     CEFTRIAXONE <=0.25 SENSITIVE Sensitive     CIPROFLOXACIN <=0.25 SENSITIVE Sensitive     GENTAMICIN <=1 SENSITIVE Sensitive     IMIPENEM <=0.25 SENSITIVE Sensitive     TRIMETH/SULFA <=20 SENSITIVE Sensitive     AMPICILLIN/SULBACTAM 4 SENSITIVE Sensitive     PIP/TAZO <=4 SENSITIVE Sensitive     * ABUNDANT KLEBSIELLA PNEUMONIAE   Proteus mirabilis - MIC*    AMPICILLIN <=2 SENSITIVE Sensitive     CEFAZOLIN <=4 SENSITIVE Sensitive     CEFEPIME <=0.12 SENSITIVE Sensitive     CEFTAZIDIME <=1 SENSITIVE Sensitive     CEFTRIAXONE <=0.25 SENSITIVE Sensitive     CIPROFLOXACIN <=0.25 SENSITIVE Sensitive     GENTAMICIN <=1 SENSITIVE Sensitive     IMIPENEM 2 SENSITIVE Sensitive     TRIMETH/SULFA <=20 SENSITIVE Sensitive     AMPICILLIN/SULBACTAM <=2 SENSITIVE Sensitive     PIP/TAZO <=4 SENSITIVE Sensitive     * ABUNDANT PROTEUS MIRABILIS  Culture, blood (Routine X 2) w Reflex to ID Panel     Status: None (Preliminary result)   Collection Time: 04/14/22  8:27 AM   Specimen: BLOOD  Result Value Ref Range Status   Specimen Description BLOOD PORTA CATH  Final   Special Requests   Final    BOTTLES DRAWN AEROBIC AND ANAEROBIC Blood  Culture adequate volume   Culture   Final    NO GROWTH 3 DAYS Performed at [facility], [address] [place], [place] [address]    Report Status PENDING  Incomplete  Culture, blood (Routine X 2) w Reflex to ID Panel     Status: None (Preliminary result)   Collection Time: 04/14/22  8:30 AM   Specimen: BLOOD  Result Value Ref Range Status   Specimen Description BLOOD RIGHT ANTECUBITAL  Final   Special Requests   Final    BOTTLES DRAWN AEROBIC AND ANAEROBIC Blood Culture adequate volume   Culture   Final    NO GROWTH 3 DAYS Performed at [facility], [place] [address] [place], [place] [address]    Report Status PENDING  Incomplete  Culture, blood (  Routine X 2) w Reflex to ID Panel     Status: None (Preliminary result)   Collection Time: 04/17/22  3:07 AM   Specimen: BLOOD  Result Value Ref Range Status   Specimen Description BLOOD SITE NOT SPECIFIED  Final   Special Requests   Final    BOTTLES DRAWN AEROBIC AND ANAEROBIC Blood Culture results may not be optimal due to an inadequate volume of blood received in culture bottles   Culture   Final    NO GROWTH < 12 HOURS Performed at [facility], [address] [place], [place] [address]    Report Status PENDING  Incomplete  Culture, blood (Routine X 2) w Reflex to ID Panel     Status: None (Preliminary result)   Collection Time: 04/17/22  3:10 AM   Specimen: BLOOD  Result Value Ref Range Status   Specimen Description BLOOD SITE NOT SPECIFIED  Final   Special Requests   Final    BOTTLES DRAWN AEROBIC AND ANAEROBIC Blood Culture adequate volume   Culture   Final    NO GROWTH < 12 HOURS Performed at [facility], [place] [address], [place], [place] [address]    Report Status PENDING  Incomplete       Assessment/Plan: S/P Procedure(s) (LRB): XI ROBOTIC ASSISTED IVOR LEWIS ESOPHAGECTOMY (N/A) XI ROBOTIC ASSISTED JEJUNOSTOMY TUBE PLACEMENT (N/A) ESOPHAGOGASTRODUODENOSCOPY (EGD) (N/A) NODE  DISSECTION INTERCOSTAL NERVE BLOCK POD#13  1 Tmax 103.1 at 07:51 yesterday, only low grade elevation sinsce then 2 O2 sats ok , now on 4 liters 3 making goor UOP 4 Drain 10 cc 5 WBC 13.3 yest w/left shift- conts Zosyn 6 BS control fair 7 CXR pending- to have Chest/abd/pelvis CT today 8 cont pulm hygiene- will add nebs     LOS: 13 days    Keith Giovanni PA-C Pager [phone] 04/18/2022    Ct reviewed.  Gas and fluid related to pyloroplasty.  Clinically not acting like an intrabdominal abscess.  No fluid around the J tube site.  Will drain L effusion.  Keith Moreno

## 2022-04-19 ENCOUNTER — Inpatient Hospital Stay (HOSPITAL_COMMUNITY): Payer: No Typology Code available for payment source

## 2022-04-19 HISTORY — PX: IR THORACENTESIS ASP PLEURAL SPACE W/IMG GUIDE: IMG5380

## 2022-04-19 LAB — COMPREHENSIVE METABOLIC PANEL
ALT: 22 U/L (ref 0–44)
AST: 18 U/L (ref 15–41)
Albumin: 1.9 g/dL — ABNORMAL LOW (ref 3.5–5.0)
Alkaline Phosphatase: 176 U/L — ABNORMAL HIGH (ref 38–126)
Anion gap: 7 (ref 5–15)
BUN: 23 mg/dL (ref 8–23)
CO2: 28 mmol/L (ref 22–32)
Calcium: 10.5 mg/dL — ABNORMAL HIGH (ref 8.9–10.3)
Chloride: 100 mmol/L (ref 98–111)
Creatinine, Ser: 0.87 mg/dL (ref 0.61–1.24)
GFR, Estimated: >60 mL/min (ref >60)
Glucose, Bld: 209 mg/dL — ABNORMAL HIGH (ref 70–99)
Potassium: 4.2 mmol/L (ref 3.5–5.1)
Sodium: 135 mmol/L (ref 135–145)
Total Bilirubin: 0.6 mg/dL (ref 0.3–1.2)
Total Protein: 5.2 g/dL — ABNORMAL LOW (ref 6.5–8.1)

## 2022-04-19 LAB — GLUCOSE, CAPILLARY
Glucose-Capillary: 162 mg/dL — ABNORMAL HIGH (ref 70–99)
Glucose-Capillary: 232 mg/dL — ABNORMAL HIGH (ref 70–99)
Glucose-Capillary: 144 mg/dL — ABNORMAL HIGH (ref 70–99)
Glucose-Capillary: 154 mg/dL — ABNORMAL HIGH (ref 70–99)
Glucose-Capillary: 136 mg/dL — ABNORMAL HIGH (ref 70–99)
Glucose-Capillary: 204 mg/dL — ABNORMAL HIGH (ref 70–99)

## 2022-04-19 LAB — CBC
HCT: 23.2 % — ABNORMAL LOW (ref 39.0–52.0)
Hemoglobin: 7.3 g/dL — ABNORMAL LOW (ref 13.0–17.0)
MCH: 30.3 pg (ref 26.0–34.0)
MCHC: 31.5 g/dL (ref 30.0–36.0)
MCV: 96.3 fL (ref 80.0–100.0)
Platelets: 344 10*3/uL (ref 150–400)
RBC: 2.41 MIL/uL — ABNORMAL LOW (ref 4.22–5.81)
RDW: 13.4 % (ref 11.5–15.5)
WBC: 10.5 10*3/uL (ref 4.0–10.5)
nRBC: 0 % (ref 0.0–0.2)

## 2022-04-19 LAB — CULTURE, BLOOD (ROUTINE X 2)
Culture: NO GROWTH
Culture: NO GROWTH
Special Requests: ADEQUATE
Special Requests: ADEQUATE

## 2022-04-19 MED ORDER — LIDOCAINE HCL 1 % IJ SOLN
INTRAMUSCULAR | Status: AC
  Filled 2022-04-19: qty 20

## 2022-04-19 MED ORDER — LEVALBUTEROL HCL 0.63 MG/3ML IN NEBU
0.6300 mg | INHALATION_SOLUTION | Freq: Two times a day (BID) | RESPIRATORY_TRACT | Status: DC
  Administered 2022-04-19 – 2022-04-23 (×8): 0.63 mg via RESPIRATORY_TRACT
  Filled 2022-04-19 (×8): qty 3

## 2022-04-19 MED ORDER — LIDOCAINE HCL (PF) 1 % IJ SOLN
INTRAMUSCULAR | Status: DC | PRN
  Administered 2022-04-19: 5 mL

## 2022-04-19 NOTE — Inpatient Diabetes Management (Signed)
Inpatient Diabetes Program Recommendations  AACE/ADA: New Consensus Statement on Inpatient Glycemic Control (2015)  Target Ranges:  Prepandial:   less than 140 mg/dL      Peak postprandial:   less than 180 mg/dL (1-2 hours)      Critically ill patients:  140 - 180 mg/dL   Lab Results  Component Value Date   GLUCAP 232 (H) 04/19/2022   HGBA1C 5.1 04/03/2022    Review of Glycemic Control  Latest Reference Range & Units 04/18/22 07:37 04/18/22 12:12 04/18/22 16:29 04/18/22 19:41 04/18/22 23:28 04/19/22 03:40 04/19/22 08:28  Glucose-Capillary 70 - 99 mg/dL 215 (H) 171 (H) 136 (H) 145 (H) 238 (H) 162 (H) 232 (H)   DM 2, Alogliptin 12.5 mg qhs, Actos 30 mg Daily Current orders for Inpatient glycemic control: Novolog 0-24 units Q4 hours  A1c 5.1% on 9/25 Osmolite 110 ml/hour 2000-0800 Ensure Enlive bid between meals  Inpatient Diabetes Program Recommendations:    -  Schedule Tube Feed coverage Novolog 2 units scheduled at 2000 and at 0400 only to prevent glucose spikes during the night at midnight and 0800.   Thanks,  Tama Headings RN, MSN, BC-ADM Inpatient Diabetes Coordinator Team Pager [phone] (8a-5p)

## 2022-04-19 NOTE — Progress Notes (Signed)
Mobility Specialist Progress Note    04/19/22 1107  Mobility  Activity Ambulated with assistance in hallway  Level of Assistance Contact guard assist, steadying assist  Assistive Device Four wheel walker  Distance Ambulated (ft) 194 ft (87+87)  Activity Response Tolerated well  Mobility Referral Yes  $Mobility charge 1 Mobility   Pre-Mobility: 90 HR, 99% SpO2 Post-Mobility: 96 HR, 96% SpO2  Pt received in chair and agreeable. No complaints on walk. On 4LO2. Took x1 extended seated rest break. Returned to bed with call bell in reach.    Hildred Alamin Mobility Specialist

## 2022-04-19 NOTE — Progress Notes (Addendum)
[address]       [place] [address]             [phone]      14 Days Post-Op Procedure(s) (LRB): XI ROBOTIC ASSISTED IVOR LEWIS ESOPHAGECTOMY (N/A) XI ROBOTIC ASSISTED JEJUNOSTOMY TUBE PLACEMENT (N/A) ESOPHAGOGASTRODUODENOSCOPY (EGD) (N/A) NODE DISSECTION INTERCOSTAL NERVE BLOCK Subjective: Feeling fair  Objective: Vital signs in last 24 hours: Temp:  [98.5 F (36.9 C)-99.3 F (37.4 C)] 99.2 F (37.3 C) (10/11 0343) Pulse Rate:  [81-97] 91 (10/11 0343) Cardiac Rhythm: Sinus tachycardia;Heart block (10/11 0708) Resp:  [26-41] 26 (10/11 0343) BP: (125-144)/(58-76) 125/73 (10/11 0343) SpO2:  [90 %-97 %] 97 % (10/11 0343) Weight:  [151 kg] 151 kg (10/11 0448)  Hemodynamic parameters for last 24 hours:    Intake/Output from previous day: 10/10 0701 - 10/11 0700 In: 2994.8 [I.V.:1414.7; NG/GT:1430; IV Piggyback:150.1] Out: 2310 [Urine:2300; Drains:10] Intake/Output this shift: No intake/output data recorded.   Alert, NAD Lungs - more clear in upper airways Cor - RRR Abd- obese, soft, non tender except around J tube Wounds- purulent drainade around J tube, + erethema, improved Ext- + LE edema    Lab Results: Recent Labs    04/17/22 1104 04/19/22 0500  WBC 13.3* 10.5  HGB 7.6* 7.3*  HCT 23.3* 23.2*  PLT 289 344   BMET:  Recent Labs    04/17/22 1104 04/19/22 0500  NA 132* 135  K 4.3 4.2  CL 101 100  CO2 27 28  GLUCOSE 259* 209*  BUN 29* 23  CREATININE 1.04 0.87  CALCIUM 9.9 10.5*    PT/INR: No results for input(s): "LABPROT", "INR" in the last 72 hours. ABG    Component Value Date/Time   PHART 7.41 04/06/2022 0332   HCO3 24.1 04/06/2022 0332   TCO2 28 11/13/2021 2122   ACIDBASEDEF 0.3 04/06/2022 0332   O2SAT 98 04/06/2022 0332   CBG (last 3)  Recent Labs    04/18/22 1941 04/18/22 2328 04/19/22 0340  GLUCAP 145* 238* 162*    Meds Scheduled Meds:  Chlorhexidine Gluconate Cloth  6 each Topical Daily    docusate  100 mg Per Tube BID   enoxaparin (LOVENOX) injection  75 mg Subcutaneous Q24H   feeding supplement  237 mL Oral BID BM   feeding supplement (OSMOLITE 1.5 CAL)  1,000 mL Per Tube Q24H   feeding supplement (PROSource TF20)  60 mL Per Tube BID   free water  200 mL Per Tube Q4H   Gerhardt's butt cream   Topical BID   insulin aspart  0-24 Units Subcutaneous Q4H   levalbuterol  0.63 mg Nebulization TID   olopatadine  1 drop Both Eyes BID   pantoprazole (PROTONIX) IV  40 mg Intravenous Q12H   sodium chloride flush  10-40 mL Intravenous Q12H   sodium chloride flush  3 mL Intravenous Q12H   Continuous Infusions:  sodium chloride Stopped (04/18/22 2300)   piperacillin-tazobactam (ZOSYN)  IV 3.375 g (04/19/22 0501)   PRN Meds:.acetaminophen, hydrALAZINE, HYDROcodone-acetaminophen, morphine injection, ondansetron (ZOFRAN) IV, sodium chloride flush, sodium chloride flush  Xrays CT CHEST ABDOMEN PELVIS W CONTRAST  Result Date: 04/18/2022 CLINICAL DATA:  Sepsis, esophageal cancer * Tracking Code: BO * EXAM: CT CHEST, ABDOMEN, AND PELVIS WITH CONTRAST TECHNIQUE: Multidetector CT imaging of the chest, abdomen and pelvis was performed following the standard protocol during bolus administration of intravenous contrast. RADIATION DOSE REDUCTION: This exam was performed according to the departmental dose-optimization program which  includes automated exposure control, adjustment of the mA and/or kV according to patient size and/or use of iterative reconstruction technique. CONTRAST:  75m OMNIPAQUE IOHEXOL 350 MG/ML SOLN, additional enteric contrast administered per percutaneous jejunostomy COMPARISON:  CT abdomen pelvis, 01/08/2015, PET-CT, 03/01/2022 FINDINGS: CT CHEST FINDINGS Cardiovascular: Right chest port catheter. Aortic atherosclerosis. Normal heart size. Left coronary artery calcifications. Enlargement of the main pulmonary artery measuring up to 3.8 cm in caliber. No pericardial effusion.  Mediastinum/Nodes: Numerous, coarsely calcified mediastinal and hilar lymph nodes, unchanged (series 3, image 21). Status post interval pull-through esophagectomy. Fat containing postoperative hernia adjacent to the gastric pull-through (series 3, image 31). Thyroid and trachea are normal. Lungs/Pleura: Moderate left pleural effusion associated atelectasis or consolidation. Trace, loculated appearing right pleural effusion with right-sided chest tube in position posteriorly about the lung. Musculoskeletal: No chest wall abnormality. No acute osseous findings. CT ABDOMEN PELVIS FINDINGS Hepatobiliary: No solid liver abnormality is seen. No gallstones, gallbladder wall thickening, or biliary dilatation. Pancreas: Unremarkable. No pancreatic ductal dilatation or surrounding inflammatory changes. Spleen: Normal in size without significant abnormality. Adrenals/Urinary Tract: Adrenal glands are unremarkable. Small bilateral nonobstructive renal calculi. No hydronephrosis. Small focus of air within the urinary bladder, presumably secondary to recent catheterization. Stomach/Bowel: Status post pull-through esophagectomy. Elongated, extraluminal heterogeneous air and fluid collection anterior to the gastric body, pylorus, and duodenal bulb, measuring at least 14.0 x 4.0 x 3.2 cm (series 3, image 61, series 7, image 31). Percutaneous jejunostomy tube. Appendix appears normal. Descending and sigmoid diverticulosis. Vascular/Lymphatic: No significant vascular findings are present. No enlarged abdominal or pelvic lymph nodes. Reproductive: No mass or other abnormality. Other: Fat containing right inguinal hernia.  No ascites. Musculoskeletal: No acute osseous findings. IMPRESSION: 1. Status post interval pull-through esophagectomy. 2. Elongated, extraluminal heterogeneous air and fluid collection anterior to the gastric body, pylorus, and duodenal bulb, measuring at least 14.0 x 4.0 x 3.2 cm. Findings are consistent with  postoperative perforation and or abscess. 3. Moderate left pleural effusion and associated atelectasis or consolidation. Trace, loculated appearing right pleural effusion with right-sided chest tube in position posteriorly about the lung. 4. Enlargement of the main pulmonary artery, as can be seen in pulmonary hypertension. 5. Coronary artery disease. 6. Nonobstructive bilateral nephrolithiasis. 7. Descending and sigmoid diverticulosis without evidence of acute diverticulitis. These results will be called to the ordering clinician or representative by the Radiologist Assistant, and communication documented in the PACS or [employer] Aortic Atherosclerosis (ICD10-I70.0). Electronically Signed   By: ADelanna AhmadiM.D.   On: 04/18/2022 15:25   DG Chest 2 View  Result Date: 04/18/2022 CLINICAL DATA:  Surgery follow-up. History of esophagectomy. Shortness of breath and weakness. History of esophageal cancer. EXAM: CHEST - 2 VIEW COMPARISON:  AP chest 04/17/2022, 04/14/2022, 04/13/2022, 04/11/2022, 04/09/2022; chest two views 04/03/2022; PET-CT 03/01/2022 FINDINGS: Right chest wall porta catheter tip again overlies the central superior vena cava. Cardiac silhouette is again at the upper limits of normal size for AP technique. There is again enlargement of the bilateral hila, noting bilateral calcified lymphadenopathy on prior 03/01/2022 PET-CT. Mildly decreased lung volumes. The lateral view there is associated posterior basilar airspace opacity, possible atelectasis. Otherwise, no focal airspace opacity on frontal view to indicate pneumonia. On lateral view there appear to be small bilateral pleural effusions. No pneumothorax. Moderate multilevel degenerative disc changes of the thoracic spine. IMPRESSION: Small bilateral pleural effusions. Likely associated subsegmental atelectasis. Electronically Signed   By: RYvonne KendallM.D.   On: 04/18/2022 08:19  Recent Results (from the past 240 hour(s))   Aerobic Culture w Gram Stain (superficial specimen)     Status: None   Collection Time: 04/14/22  7:48 AM   Specimen: Other Source; Wound  Result Value Ref Range Status   Specimen Description OTHER  Final   Special Requests Immunocompromised  Final   Gram Stain   Final    NO WBC SEEN ABUNDANT GRAM NEGATIVE RODS MODERATE GRAM POSITIVE COCCI FEW GRAM POSITIVE RODS    Culture   Final    ABUNDANT KLEBSIELLA PNEUMONIAE ABUNDANT PROTEUS MIRABILIS RARE PEDIOCOCCUS PENTOSACEUS Standardized susceptibility testing for this organism is not available. Performed at [facility], [place] [address], [place], [place] [address]    Report Status 04/16/2022 FINAL  Final   Organism ID, Bacteria KLEBSIELLA PNEUMONIAE  Final   Organism ID, Bacteria PROTEUS MIRABILIS  Final      Susceptibility   Klebsiella pneumoniae - MIC*    AMPICILLIN RESISTANT Resistant     CEFAZOLIN <=4 SENSITIVE Sensitive     CEFEPIME <=0.12 SENSITIVE Sensitive     CEFTAZIDIME <=1 SENSITIVE Sensitive     CEFTRIAXONE <=0.25 SENSITIVE Sensitive     CIPROFLOXACIN <=0.25 SENSITIVE Sensitive     GENTAMICIN <=1 SENSITIVE Sensitive     IMIPENEM <=0.25 SENSITIVE Sensitive     TRIMETH/SULFA <=20 SENSITIVE Sensitive     AMPICILLIN/SULBACTAM 4 SENSITIVE Sensitive     PIP/TAZO <=4 SENSITIVE Sensitive     * ABUNDANT KLEBSIELLA PNEUMONIAE   Proteus mirabilis - MIC*    AMPICILLIN <=2 SENSITIVE Sensitive     CEFAZOLIN <=4 SENSITIVE Sensitive     CEFEPIME <=0.12 SENSITIVE Sensitive     CEFTAZIDIME <=1 SENSITIVE Sensitive     CEFTRIAXONE <=0.25 SENSITIVE Sensitive     CIPROFLOXACIN <=0.25 SENSITIVE Sensitive     GENTAMICIN <=1 SENSITIVE Sensitive     IMIPENEM 2 SENSITIVE Sensitive     TRIMETH/SULFA <=20 SENSITIVE Sensitive     AMPICILLIN/SULBACTAM <=2 SENSITIVE Sensitive     PIP/TAZO <=4 SENSITIVE Sensitive     * ABUNDANT PROTEUS MIRABILIS  Culture, blood (Routine X 2) w Reflex to ID Panel     Status: None (Preliminary result)    Collection Time: 04/14/22  8:27 AM   Specimen: BLOOD  Result Value Ref Range Status   Specimen Description BLOOD PORTA CATH  Final   Special Requests   Final    BOTTLES DRAWN AEROBIC AND ANAEROBIC Blood Culture adequate volume   Culture   Final    NO GROWTH 4 DAYS Performed at [facility], [address], [place], [place] [address]    Report Status PENDING  Incomplete  Culture, blood (Routine X 2) w Reflex to ID Panel     Status: None (Preliminary result)   Collection Time: 04/14/22  8:30 AM   Specimen: BLOOD  Result Value Ref Range Status   Specimen Description BLOOD RIGHT ANTECUBITAL  Final   Special Requests   Final    BOTTLES DRAWN AEROBIC AND ANAEROBIC Blood Culture adequate volume   Culture   Final    NO GROWTH 4 DAYS Performed at [facility], [place] [address], [place], [place] [address]    Report Status PENDING  Incomplete  Culture, blood (Routine X 2) w Reflex to ID Panel     Status: None (Preliminary result)   Collection Time: 04/17/22  3:07 AM   Specimen: BLOOD  Result Value Ref Range Status   Specimen Description BLOOD SITE NOT SPECIFIED  Final   Special Requests   Final    BOTTLES DRAWN AEROBIC AND ANAEROBIC Blood Culture results may not be optimal due to an inadequate volume of blood received in culture bottles   Culture   Final    NO GROWTH 1 DAY Performed at [place] [address] [place], [place] [address]    Report Status PENDING  Incomplete  Culture, blood (Routine X 2) w Reflex to ID Panel     Status: None (Preliminary result)   Collection Time: 04/17/22  3:10 AM   Specimen: BLOOD  Result Value Ref Range Status   Specimen Description BLOOD SITE NOT SPECIFIED  Final   Special Requests   Final    BOTTLES DRAWN AEROBIC AND ANAEROBIC Blood Culture adequate volume   Culture   Final    NO GROWTH 1 DAY Performed at [facility], [place] [address], [place], [place] [address]    Report Status PENDING  Incomplete     Assessment/Plan: S/P Procedure(s) (LRB): XI ROBOTIC ASSISTED IVOR LEWIS ESOPHAGECTOMY (N/A) XI ROBOTIC ASSISTED JEJUNOSTOMY TUBE PLACEMENT (N/A) ESOPHAGOGASTRODUODENOSCOPY (EGD) (N/A) NODE DISSECTION INTERCOSTAL NERVE BLOCK POD#14  1 afeb, Tmax 99,3, sinus tachy at times, s BP 120's-140's 2 sats ok on 4 liters 3 on D3 diet 4 good UOP, renal fxn remains normal 5 drain 10 cc 6 alk phos elevated at 176- no GB dz on CT scan 7 diverticulosis without diverticulitis on scan 8 for left thoracentesis today, NGSF on Blood cx 9 no leukocytosis- cont zosyn for same 3 organisms as documented above 10 H/H low but stable 11 BS control somewhat improved       LOS: 14 days    [name] PA-C Pager [phone] 04/19/2022  Agree with above L thora today Benign abdomen Continue wound care to J tube site  [employer]

## 2022-04-19 NOTE — Progress Notes (Signed)
Physical Therapy Treatment Patient Details Name: Keith Moreno MRN: [id number] DOB: Apr 21, 1961 Today's Date: 04/19/2022   History of Present Illness 61 yo male admitted 9/27 for esophagectomy with J tube placement due to esophageal CA. PMhx: chronic low back pain, esophageal CA    PT Comments    Pt with continued progress this session with focus on safe transfers and gait training for increased activity tolerance and LE strength. Pt able to complete gait with increased tolerance with rollator and min guard for safety, with seated rest break needed secondary to fatigue. Pt agreeable to time up in chair at end of session. Encouraged pt and educated re; importance of continued mobility and benefits of several walks throughout day for increasing activity tolerance with pt verbalizing understanding. Plan to continue to advance gait and initiate stair training next session. Pt continues to benefit from skilled PT services to progress toward functional mobility goals.    Recommendations for follow up therapy are one component of a multi-disciplinary discharge planning process, led by the attending physician.  Recommendations may be updated based on patient status, additional functional criteria and insurance authorization.  Follow Up Recommendations  Home health PT     Assistance Recommended at Discharge Intermittent Supervision/Assistance  Patient can return home with the following A little help with walking and/or transfers;A little help with bathing/dressing/bathroom;Assistance with cooking/housework;Assist for transportation;Help with stairs or ramp for entrance   Equipment Recommendations  Rollator (4 wheels)    Recommendations for Other Services       Precautions / Restrictions Precautions Precautions: Fall;Other (comment) Precaution Comments: JP drain rt flank, PEG Restrictions Weight Bearing Restrictions: No     Mobility  Bed Mobility Overal bed mobility: Needs  Assistance Bed Mobility: Rolling, Sidelying to Sit Rolling: Supervision Sidelying to sit: Min guard       General bed mobility comments: min gaurd for safety    Transfers Overall transfer level: Needs assistance Equipment used: Rollator (4 wheels) Transfers: Sit to/from Stand Sit to Stand: Min guard           General transfer comment: min guard for safety    Ambulation/Gait Ambulation/Gait assistance: Min guard Gait Distance (Feet): 116 Feet (x2 with seated recovery break) Assistive device: Rollator (4 wheels) Gait Pattern/deviations: Step-through pattern, Decreased stride length Gait velocity: decr     General Gait Details: pt on 4L with SpO2 >95%. distance continues to be limited by fatigue   Stairs             Wheelchair Mobility    Modified Rankin (Stroke Patients Only)       Balance Overall balance assessment: Needs assistance Sitting-balance support: Feet supported Sitting balance-Leahy Scale: Good     Standing balance support: Bilateral upper extremity supported, Reliant on assistive device for balance Standing balance-Leahy Scale: Poor Standing balance comment: rollator in standing                            Cognition Arousal/Alertness: Awake/alert Behavior During Therapy: Flat affect Overall Cognitive Status: Within Functional Limits for tasks assessed                                 General Comments: flat affect but following all commands        Exercises      General Comments        Pertinent Vitals/Pain Pain Assessment Pain Assessment: Faces Faces  Pain Scale: Hurts a little bit Pain Location: general Pain Descriptors / Indicators: Aching Pain Intervention(s): Monitored during session, Limited activity within patient's tolerance, Repositioned    Home Living                          Prior Function            PT Goals (current goals can now be found in the care plan section) Acute  Rehab PT Goals Patient Stated Goal: return home PT Goal Formulation: With patient Time For Goal Achievement: 04/25/22    Frequency    Min 3X/week      PT Plan Current plan remains appropriate    Co-evaluation              AM-PAC PT "6 Clicks" Mobility   Outcome Measure  Help needed turning from your back to your side while in a flat bed without using bedrails?: A Little Help needed moving from lying on your back to sitting on the side of a flat bed without using bedrails?: A Little Help needed moving to and from a bed to a chair (including a wheelchair)?: A Little Help needed standing up from a chair using your arms (e.g., wheelchair or bedside chair)?: A Little Help needed to walk in hospital room?: A Little Help needed climbing 3-5 steps with a railing? : A Lot 6 Click Score: 17    End of Session Equipment Utilized During Treatment: Oxygen Activity Tolerance: Patient tolerated treatment well Patient left: in chair;with call bell/phone within reach;with nursing/sitter in room Nurse Communication: Mobility status PT Visit Diagnosis: Other abnormalities of gait and mobility (R26.89);Difficulty in walking, not elsewhere classified (R26.2);Muscle weakness (generalized) (M62.81)     Time: 5284-1324 PT Time Calculation (min) (ACUTE ONLY): 26 min  Charges:  $Gait Training: 8-22 mins $Therapeutic Activity: 8-22 mins                    Machele Deihl R. PTA Acute Rehabilitation Services Office: [phone]    Catalina Antigua 04/19/2022, 4:33 PM

## 2022-04-19 NOTE — Procedures (Signed)
PROCEDURE SUMMARY:  Successful US guided left thoracentesis. Yielded 1.1 L of serosanguinous fluid. Pt tolerated procedure well. No immediate complications.  Specimen was sent for labs. CXR ordered.  EBL < 5 mL  Ascencion Dike PA-C 04/19/2022 12:31 PM

## 2022-04-19 NOTE — Plan of Care (Signed)
  Problem: Education: Goal: Knowledge of General Education information will improve Description: Including pain rating scale, medication(s)/side effects and non-pharmacologic comfort measures Outcome: Progressing   Problem: Health Behavior/Discharge Planning: Goal: Ability to manage health-related needs will improve Outcome: Progressing   Problem: Clinical Measurements: Goal: Ability to maintain clinical measurements within normal limits will improve Outcome: Progressing Goal: Will remain free from infection Outcome: Progressing Goal: Diagnostic test results will improve Outcome: Progressing Goal: Respiratory complications will improve Outcome: Progressing Goal: Cardiovascular complication will be avoided Outcome: Progressing   Problem: Activity: Goal: Risk for activity intolerance will decrease Outcome: Progressing   Problem: Nutrition: Goal: Adequate nutrition will be maintained Outcome: Progressing   Problem: Coping: Goal: Level of anxiety will decrease Outcome: Progressing   Problem: Elimination: Goal: Will not experience complications related to bowel motility Outcome: Progressing Goal: Will not experience complications related to urinary retention Outcome: Progressing   Problem: Pain Managment: Goal: General experience of comfort will improve Outcome: Progressing   Problem: Safety: Goal: Ability to remain free from injury will improve Outcome: Progressing   Problem: Skin Integrity: Goal: Risk for impaired skin integrity will decrease Outcome: Progressing   Problem: Education: Goal: Knowledge of the prescribed therapeutic regimen will improve Outcome: Progressing   Problem: Bowel/Gastric: Goal: Gastrointestinal status for postoperative course will improve Outcome: Progressing   Problem: Nutritional: Goal: Ability to achieve adequate nutritional intake will improve Outcome: Progressing   Problem: Clinical Measurements: Goal: Postoperative  complications will be avoided or minimized Outcome: Progressing   Problem: Respiratory: Goal: Ability to maintain a clear airway will improve Outcome: Progressing   Problem: Skin Integrity: Goal: Demonstration of wound healing without infection will improve Outcome: Progressing   Problem: Safety: Goal: Non-violent Restraint(s) Outcome: Progressing

## 2022-04-20 ENCOUNTER — Inpatient Hospital Stay (HOSPITAL_COMMUNITY): Payer: No Typology Code available for payment source

## 2022-04-20 LAB — GLUCOSE, CAPILLARY
Glucose-Capillary: 137 mg/dL — ABNORMAL HIGH (ref 70–99)
Glucose-Capillary: 142 mg/dL — ABNORMAL HIGH (ref 70–99)
Glucose-Capillary: 156 mg/dL — ABNORMAL HIGH (ref 70–99)
Glucose-Capillary: 157 mg/dL — ABNORMAL HIGH (ref 70–99)
Glucose-Capillary: 189 mg/dL — ABNORMAL HIGH (ref 70–99)
Glucose-Capillary: 236 mg/dL — ABNORMAL HIGH (ref 70–99)

## 2022-04-20 MED ORDER — GLUCERNA SHAKE PO LIQD
237.0000 mL | Freq: Three times a day (TID) | ORAL | Status: DC
  Administered 2022-04-21 – 2022-04-23 (×6): 237 mL via ORAL

## 2022-04-20 MED ORDER — LEVALBUTEROL HCL 0.63 MG/3ML IN NEBU: 0.6300 mg | INHALATION_SOLUTION | Freq: Four times a day (QID) | RESPIRATORY_TRACT | Status: DC | PRN

## 2022-04-20 MED ORDER — SODIUM CHLORIDE 0.9% FLUSH
10.0000 mL | Freq: Two times a day (BID) | INTRAVENOUS | Status: DC
  Administered 2022-04-20 – 2022-04-28 (×16): 10 mL

## 2022-04-20 MED ORDER — ACETAMINOPHEN 160 MG/5ML PO SOLN
1000.0000 mg | Freq: Four times a day (QID) | ORAL | Status: DC | PRN
  Administered 2022-04-20 – 2022-04-27 (×5): 1000 mg
  Filled 2022-04-20 (×6): qty 40.6

## 2022-04-20 MED ORDER — SODIUM CHLORIDE 0.9% FLUSH: 10.0000 mL | INTRAVENOUS | Status: DC | PRN

## 2022-04-20 MED ORDER — CIPROFLOXACIN HCL 500 MG PO TABS
500.0000 mg | ORAL_TABLET | Freq: Two times a day (BID) | ORAL | Status: DC
  Administered 2022-04-20 – 2022-04-26 (×14): 500 mg via ORAL
  Filled 2022-04-20 (×15): qty 1

## 2022-04-20 NOTE — TOC Progression Note (Addendum)
Transition of Care [facility]) - Progression Note    Patient Details  Name: Ahren Pettinger MRN: [id number] Date of Birth: 30-Jun-1961  [facility] CM/SW Contact  Zenon Mayo, RN Phone Number: 04/20/2022, 11:12 AM  Clinical Narrative:    Per April with [facility], patient goes to [place] PCP is [place], and CSW is [place] [phone].  NCM left message for  Harriet Pho at the [place]. For return call to  make sure she has everything needed for night time tube feeds and DME.  Awaiting call back.         Expected Discharge Plan and Services                                                 Social Determinants of Health (SDOH) Interventions    Readmission Risk Interventions     No data to display

## 2022-04-20 NOTE — Plan of Care (Signed)
  Problem: Education: Goal: Knowledge of General Education information will improve Description: Including pain rating scale, medication(s)/side effects and non-pharmacologic comfort measures Outcome: Progressing   Problem: Health Behavior/Discharge Planning: Goal: Ability to manage health-related needs will improve Outcome: Progressing   Problem: Clinical Measurements: Goal: Ability to maintain clinical measurements within normal limits will improve Outcome: Progressing Goal: Will remain free from infection Outcome: Progressing Goal: Diagnostic test results will improve Outcome: Progressing Goal: Respiratory complications will improve Outcome: Progressing Goal: Cardiovascular complication will be avoided Outcome: Progressing   Problem: Activity: Goal: Risk for activity intolerance will decrease Outcome: Progressing   Problem: Nutrition: Goal: Adequate nutrition will be maintained Outcome: Progressing   Problem: Coping: Goal: Level of anxiety will decrease Outcome: Progressing   Problem: Elimination: Goal: Will not experience complications related to bowel motility Outcome: Progressing Goal: Will not experience complications related to urinary retention Outcome: Progressing   Problem: Pain Managment: Goal: General experience of comfort will improve Outcome: Progressing   Problem: Safety: Goal: Ability to remain free from injury will improve Outcome: Progressing   Problem: Skin Integrity: Goal: Risk for impaired skin integrity will decrease Outcome: Progressing   Problem: Education: Goal: Knowledge of the prescribed therapeutic regimen will improve Outcome: Progressing   Problem: Bowel/Gastric: Goal: Gastrointestinal status for postoperative course will improve Outcome: Progressing   Problem: Nutritional: Goal: Ability to achieve adequate nutritional intake will improve Outcome: Progressing   Problem: Clinical Measurements: Goal: Postoperative  complications will be avoided or minimized Outcome: Progressing   Problem: Respiratory: Goal: Ability to maintain a clear airway will improve Outcome: Progressing   Problem: Skin Integrity: Goal: Demonstration of wound healing without infection will improve Outcome: Progressing   Problem: Safety: Goal: Non-violent Restraint(s) Outcome: Progressing

## 2022-04-20 NOTE — Progress Notes (Signed)
Occupational Therapy Treatment Patient Details Name: Keith Moreno MRN: [id number] DOB: [birth year] Today's Date: 04/20/2022   History of present illness 61 yo male admitted 9/27 for esophagectomy with J tube placement due to esophageal CA. PMhx: chronic low back pain, esophageal CA   OT comments  Pt continues to require 3.5L 02 to maintain Sp02> 90%. Fatigue continues to limit. Educated in energy conservation strategies and recommended seated showering when he returns home. Pt is able to perform figure 4 to reach feet. Pt will have supportive wife at home to assist him as needed.    Recommendations for follow up therapy are one component of a multi-disciplinary discharge planning process, led by the attending physician.  Recommendations may be updated based on patient status, additional functional criteria and insurance authorization.    Follow Up Recommendations  Home health OT    Assistance Recommended at Discharge Intermittent Supervision/Assistance  Patient can return home with the following  A little help with walking and/or transfers;A little help with bathing/dressing/bathroom;Assistance with cooking/housework;Assist for transportation;Help with stairs or ramp for entrance   Equipment Recommendations       Recommendations for Other Services      Precautions / Restrictions Precautions Precautions: Fall;Other (comment) Precaution Comments: JP drain rt flank, PEG Restrictions Weight Bearing Restrictions: No       Mobility Bed Mobility               General bed mobility comments: in chair    Transfers   Equipment used: Rolling walker (2 wheels) Transfers: Sit to/from Stand Sit to Stand: Min guard                 Balance Overall balance assessment: Needs assistance Sitting-balance support: Feet supported Sitting balance-Leahy Scale: Good     Standing balance support: Bilateral upper extremity supported, Reliant on assistive device for  balance Standing balance-Leahy Scale: Poor                             ADL either performed or assessed with clinical judgement   ADL Overall ADL's : Needs assistance/impaired                     Lower Body Dressing: Set up;Sitting/lateral leans Lower Body Dressing Details (indicate cue type and reason): pt is able to perform figure 4 to reach feet for donning socks             Functional mobility during ADLs: Rolling walker (2 wheels);Min guard General ADL Comments: Educated in energy conservation and benefits of seated showering.    Extremity/Trunk Assessment              Vision       Perception     Praxis      Cognition Arousal/Alertness: Awake/alert Behavior During Therapy: Flat affect Overall Cognitive Status: Within Functional Limits for tasks assessed                                          Exercises      Shoulder Instructions       General Comments      Pertinent Vitals/ Pain       Pain Assessment Pain Assessment: No/denies pain  Home Living  Prior Functioning/Environment              Frequency  Min 2X/week        Progress Toward Goals  OT Goals(current goals can now be found in the care plan section)  Progress towards OT goals: Progressing toward goals  Acute Rehab OT Goals OT Goal Formulation: With patient Time For Goal Achievement: 04/25/22 Potential to Achieve Goals: Good  Plan Discharge plan remains appropriate    Co-evaluation                 AM-PAC OT "6 Clicks" Daily Activity     Outcome Measure   Help from another person eating meals?: None Help from another person taking care of personal grooming?: A Little Help from another person toileting, which includes using toliet, bedpan, or urinal?: A Little Help from another person bathing (including washing, rinsing, drying)?: A Little Help from another person to  put on and taking off regular upper body clothing?: A Little Help from another person to put on and taking off regular lower body clothing?: A Little 6 Click Score: 19    End of Session Equipment Utilized During Treatment: Rolling walker (2 wheels)  OT Visit Diagnosis: Unsteadiness on feet (R26.81);Other abnormalities of gait and mobility (R26.89);Muscle weakness (generalized) (M62.81);Pain   Activity Tolerance Patient tolerated treatment well   Patient Left in chair;with call bell/phone within reach;with nursing/sitter in room;with family/visitor present   Nurse Communication          Time: 1740-8144 OT Time Calculation (min): 20 min  Charges: OT General Charges $OT Visit: 1 Visit OT Treatments $Self Care/Home Management : 8-22 mins  Cleta Alberts, OTR/L Acute Rehabilitation Services Office: [phone]   [name] 04/20/2022, 3:38 PM

## 2022-04-20 NOTE — Progress Notes (Signed)
Physical Therapy Treatment Patient Details Name: Keith Moreno MRN: [id number] DOB: 1961/03/20 Today's Date: 04/20/2022   History of Present Illness 61 yo male admitted 9/27 for esophagectomy with J tube placement due to esophageal CA. PMhx: chronic low back pain, esophageal CA    PT Comments    Pt received OOB in recliner on arrival and agreeable to session, however pt continues to be limited by fatigue. Session focused on gait training for increased activity tolerance with pt mobilizing with grossly min guard  with rollator throughout, however pt continues to need extended seated recovery break between gait bouts secondary to faitgue. SpO2 >95% on 4L, with HR 95-121bpm during activity. Continued education re; increasing activity tolerance slowly, activity recommendations post acutely and strategies for stair negotiation and safe mobility at home, with pt and pt spouse verbalizing understanding. Pt continues to benefit from skilled PT services to progress toward functional mobility goals.    Recommendations for follow up therapy are one component of a multi-disciplinary discharge planning process, led by the attending physician.  Recommendations may be updated based on patient status, additional functional criteria and insurance authorization.  Follow Up Recommendations  Home health PT     Assistance Recommended at Discharge Intermittent Supervision/Assistance  Patient can return home with the following A little help with walking and/or transfers;A little help with bathing/dressing/bathroom;Assistance with cooking/housework;Assist for transportation;Help with stairs or ramp for entrance   Equipment Recommendations  Rollator (4 wheels)    Recommendations for Other Services       Precautions / Restrictions Precautions Precautions: Fall;Other (comment) Precaution Comments: JP drain rt flank, PEG Restrictions Weight Bearing Restrictions: No     Mobility  Bed Mobility Overal bed  mobility: Needs Assistance             General bed mobility comments: pt OOB pre and post session    Transfers Overall transfer level: Needs assistance Equipment used: Rollator (4 wheels) Transfers: Sit to/from Stand Sit to Stand: Min guard           General transfer comment: min guard for safety from low recliner and rolaltor    Ambulation/Gait Ambulation/Gait assistance: Min guard Gait Distance (Feet): 85 Feet (x 2) Assistive device: Rollator (4 wheels) Gait Pattern/deviations: Step-through pattern, Decreased stride length Gait velocity: decr     General Gait Details: pt on 4L with SpO2 >95%. distance continues to be limited by fatigue   Stairs             Wheelchair Mobility    Modified Rankin (Stroke Patients Only)       Balance Overall balance assessment: Needs assistance Sitting-balance support: Feet supported Sitting balance-Leahy Scale: Good     Standing balance support: Bilateral upper extremity supported, Reliant on assistive device for balance Standing balance-Leahy Scale: Poor Standing balance comment: rollator in standing                            Cognition Arousal/Alertness: Awake/alert Behavior During Therapy: Flat affect Overall Cognitive Status: Within Functional Limits for tasks assessed                                 General Comments: flat affect but following all commands        Exercises      General Comments        Pertinent Vitals/Pain Pain Assessment Pain Assessment: Faces Faces Pain Scale: Hurts  a little bit Pain Location: general Pain Descriptors / Indicators: Aching Pain Intervention(s): Monitored during session, Limited activity within patient's tolerance    Home Living                          Prior Function            PT Goals (current goals can now be found in the care plan section) Acute Rehab PT Goals Patient Stated Goal: return home PT Goal Formulation:  With patient Time For Goal Achievement: 04/25/22    Frequency    Min 3X/week      PT Plan      Co-evaluation              AM-PAC PT "6 Clicks" Mobility   Outcome Measure  Help needed turning from your back to your side while in a flat bed without using bedrails?: A Little Help needed moving from lying on your back to sitting on the side of a flat bed without using bedrails?: A Little Help needed moving to and from a bed to a chair (including a wheelchair)?: A Little Help needed standing up from a chair using your arms (e.g., wheelchair or bedside chair)?: A Little Help needed to walk in hospital room?: A Little Help needed climbing 3-5 steps with a railing? : A Lot 6 Click Score: 17    End of Session Equipment Utilized During Treatment: Oxygen Activity Tolerance: Patient limited by fatigue Patient left: in chair;with call bell/phone within reach;with family/visitor present Nurse Communication: Mobility status PT Visit Diagnosis: Other abnormalities of gait and mobility (R26.89);Difficulty in walking, not elsewhere classified (R26.2);Muscle weakness (generalized) (M62.81)     Time: 1308-6578 PT Time Calculation (min) (ACUTE ONLY): 27 min  Charges:  $Gait Training: 8-22 mins $Therapeutic Activity: 8-22 mins                     Alistar Mcenery R. PTA Acute Rehabilitation Services Office: [phone]    Catalina Antigua 04/20/2022, 12:51 PM

## 2022-04-20 NOTE — Progress Notes (Addendum)
[address]       [place] [address]             [phone]      15 Days Post-Op Procedure(s) (LRB): XI ROBOTIC ASSISTED IVOR LEWIS ESOPHAGECTOMY (N/A) XI ROBOTIC ASSISTED JEJUNOSTOMY TUBE PLACEMENT (N/A) ESOPHAGOGASTRODUODENOSCOPY (EGD) (N/A) NODE DISSECTION INTERCOSTAL NERVE BLOCK Subjective: Breathing a bit more comfortable, no further fevers  Objective: Vital signs in last 24 hours: Temp:  [97.6 F (36.4 C)-99.2 F (37.3 C)] 97.6 F (36.4 C) (10/12 0342) Pulse Rate:  [86-104] 86 (10/12 0342) Cardiac Rhythm: Heart block (10/11 1900) Resp:  [20-28] 28 (10/12 0342) BP: (115-139)/(65-86) 115/65 (10/12 0342) SpO2:  [93 %-100 %] 97 % (10/12 0342) Weight:  [150.2 kg] 150.2 kg (10/12 0418)  Hemodynamic parameters for last 24 hours:    Intake/Output from previous day: 10/11 0701 - 10/12 0700 In: 3755.7 [NG/GT:3605.8; IV Piggyback:149.9] Out: 2060 [Urine:2050; Drains:10] Intake/Output this shift: Total I/O In: 1291.6 [NG/GT:1205.8; IV Piggyback:85.8] Out: 1160 [Urine:1150; Drains:10]  General appearance: alert, cooperative, fatigued, and no distress Heart: regular rate and rhythm Lungs: clear to auscultation bilaterally Abdomen: benign Extremities: minor edema Wound: incis healing well, J tube still with some purulent drainage and erethema/skin break down- looks a little better  Lab Results: Recent Labs    04/17/22 1104 04/19/22 0500  WBC 13.3* 10.5  HGB 7.6* 7.3*  HCT 23.3* 23.2*  PLT 289 344   BMET:  Recent Labs    04/17/22 1104 04/19/22 0500  NA 132* 135  K 4.3 4.2  CL 101 100  CO2 27 28  GLUCOSE 259* 209*  BUN 29* 23  CREATININE 1.04 0.87  CALCIUM 9.9 10.5*    PT/INR: No results for input(s): "LABPROT", "INR" in the last 72 hours. ABG    Component Value Date/Time   PHART 7.41 04/06/2022 0332   HCO3 24.1 04/06/2022 0332   TCO2 28 11/13/2021 2122   ACIDBASEDEF 0.3 04/06/2022 0332   O2SAT 98 04/06/2022 0332   CBG  (last 3)  Recent Labs    04/19/22 2003 04/19/22 2317 04/20/22 0321  GLUCAP 136* 204* 236*    Meds Scheduled Meds:  Chlorhexidine Gluconate Cloth  6 each Topical Daily   docusate  100 mg Per Tube BID   enoxaparin (LOVENOX) injection  75 mg Subcutaneous Q24H   feeding supplement  237 mL Oral BID BM   feeding supplement (OSMOLITE 1.5 CAL)  1,000 mL Per Tube Q24H   feeding supplement (PROSource TF20)  60 mL Per Tube BID   free water  200 mL Per Tube Q4H   Gerhardt's butt cream   Topical BID   insulin aspart  0-24 Units Subcutaneous Q4H   levalbuterol  0.63 mg Nebulization BID   olopatadine  1 drop Both Eyes BID   pantoprazole (PROTONIX) IV  40 mg Intravenous Q12H   sodium chloride flush  10-40 mL Intravenous Q12H   sodium chloride flush  10-40 mL Intracatheter Q12H   sodium chloride flush  3 mL Intravenous Q12H   Continuous Infusions:  sodium chloride Stopped (04/18/22 2300)   piperacillin-tazobactam (ZOSYN)  IV 3.375 g (04/20/22 0609)   PRN Meds:.acetaminophen, hydrALAZINE, HYDROcodone-acetaminophen, lidocaine (PF), morphine injection, ondansetron (ZOFRAN) IV, sodium chloride flush, sodium chloride flush, sodium chloride flush  Xrays IR THORACENTESIS ASP PLEURAL SPACE W/IMG GUIDE  Result Date: 04/19/2022 INDICATION: Shortness of breath. Left-sided pleural effusion. Request for diagnostic and therapeutic thoracentesis. EXAM: ULTRASOUND GUIDED LEFT THORACENTESIS MEDICATIONS: 1% plain lidocaine, 7  mL COMPLICATIONS: None immediate. PROCEDURE: An ultrasound guided thoracentesis was thoroughly discussed with the patient and questions answered. The benefits, risks, alternatives and complications were also discussed. The patient understands and wishes to proceed with the procedure. Written consent was obtained. Ultrasound was performed to localize and mark an adequate pocket of fluid in the left chest. The area was then prepped and draped in the normal sterile fashion. 1% Lidocaine was used  for local anesthesia. Under ultrasound guidance a 6 Fr Safe-T-Centesis catheter was introduced. Thoracentesis was performed. The catheter was removed and a dressing applied. FINDINGS: A total of approximately 1.1 L of serosanguineous pleural fluid was removed. Samples were sent to the laboratory as requested by the clinical team. IMPRESSION: Successful ultrasound guided left thoracentesis yielding 1.1 L of pleural fluid. Read by: Ascencion Dike PA-C Electronically Signed   By: Sandi Mariscal M.D.   On: 04/19/2022 13:30   DG Chest 1 View  Result Date: 04/19/2022 CLINICAL DATA:  Pleural effusion on left.  Post thoracentesis. EXAM: CHEST  1 VIEW COMPARISON:  Chest two views 04/18/2022, CT chest 04/18/2022, PET-CT 09/26/2021 FINDINGS: Right chest wall porta catheter tip overlies the central superior vena cava. Heart size is mildly enlarged. There is again widening of the mediastinum and bilateral hilar contours, consistent with the bilateral calcified lymphadenopathy on prior 03/01/2022 PET-CT. On frontal view, no significant pleural effusion is seen, however note is made that a moderate pleural effusion was seen on yesterday's chest CT and only visualized on yesterday's lateral chest radiograph. No pneumothorax is seen. No acute skeletal abnormality. IMPRESSION: 1. No significant pleural effusion is seen following thoracentesis, however note is made that a moderate pleural effusion was seen on yesterday's chest CT and this was only visualized on yesterday's lateral chest radiograph. 2. No pneumothorax. Electronically Signed   By: Yvonne Kendall M.D.   On: 04/19/2022 12:49   CT CHEST ABDOMEN PELVIS W CONTRAST  Result Date: 04/18/2022 CLINICAL DATA:  Sepsis, esophageal cancer * Tracking Code: BO * EXAM: CT CHEST, ABDOMEN, AND PELVIS WITH CONTRAST TECHNIQUE: Multidetector CT imaging of the chest, abdomen and pelvis was performed following the standard protocol during bolus administration of intravenous contrast.  RADIATION DOSE REDUCTION: This exam was performed according to the departmental dose-optimization program which includes automated exposure control, adjustment of the mA and/or kV according to patient size and/or use of iterative reconstruction technique. CONTRAST:  22m OMNIPAQUE IOHEXOL 350 MG/ML SOLN, additional enteric contrast administered per percutaneous jejunostomy COMPARISON:  CT abdomen pelvis, 01/08/2015, PET-CT, 03/01/2022 FINDINGS: CT CHEST FINDINGS Cardiovascular: Right chest port catheter. Aortic atherosclerosis. Normal heart size. Left coronary artery calcifications. Enlargement of the main pulmonary artery measuring up to 3.8 cm in caliber. No pericardial effusion. Mediastinum/Nodes: Numerous, coarsely calcified mediastinal and hilar lymph nodes, unchanged (series 3, image 21). Status post interval pull-through esophagectomy. Fat containing postoperative hernia adjacent to the gastric pull-through (series 3, image 31). Thyroid and trachea are normal. Lungs/Pleura: Moderate left pleural effusion associated atelectasis or consolidation. Trace, loculated appearing right pleural effusion with right-sided chest tube in position posteriorly about the lung. Musculoskeletal: No chest wall abnormality. No acute osseous findings. CT ABDOMEN PELVIS FINDINGS Hepatobiliary: No solid liver abnormality is seen. No gallstones, gallbladder wall thickening, or biliary dilatation. Pancreas: Unremarkable. No pancreatic ductal dilatation or surrounding inflammatory changes. Spleen: Normal in size without significant abnormality. Adrenals/Urinary Tract: Adrenal glands are unremarkable. Small bilateral nonobstructive renal calculi. No hydronephrosis. Small focus of air within the urinary bladder, presumably secondary to recent catheterization. Stomach/Bowel:  Status post pull-through esophagectomy. Elongated, extraluminal heterogeneous air and fluid collection anterior to the gastric body, pylorus, and duodenal bulb,  measuring at least 14.0 x 4.0 x 3.2 cm (series 3, image 61, series 7, image 31). Percutaneous jejunostomy tube. Appendix appears normal. Descending and sigmoid diverticulosis. Vascular/Lymphatic: No significant vascular findings are present. No enlarged abdominal or pelvic lymph nodes. Reproductive: No mass or other abnormality. Other: Fat containing right inguinal hernia.  No ascites. Musculoskeletal: No acute osseous findings. IMPRESSION: 1. Status post interval pull-through esophagectomy. 2. Elongated, extraluminal heterogeneous air and fluid collection anterior to the gastric body, pylorus, and duodenal bulb, measuring at least 14.0 x 4.0 x 3.2 cm. Findings are consistent with postoperative perforation and or abscess. 3. Moderate left pleural effusion and associated atelectasis or consolidation. Trace, loculated appearing right pleural effusion with right-sided chest tube in position posteriorly about the lung. 4. Enlargement of the main pulmonary artery, as can be seen in pulmonary hypertension. 5. Coronary artery disease. 6. Nonobstructive bilateral nephrolithiasis. 7. Descending and sigmoid diverticulosis without evidence of acute diverticulitis. These results will be called to the ordering clinician or representative by the Radiologist Assistant, and communication documented in the PACS or [employer]. Aortic Atherosclerosis (ICD10-I70.0). Electronically Signed   By: Delanna Ahmadi M.D.   On: 04/18/2022 15:25   DG Chest 2 View  Result Date: 04/18/2022 CLINICAL DATA:  Surgery follow-up. History of esophagectomy. Shortness of breath and weakness. History of esophageal cancer. EXAM: CHEST - 2 VIEW COMPARISON:  AP chest 04/17/2022, 04/14/2022, 04/13/2022, 04/11/2022, 04/09/2022; chest two views 04/03/2022; PET-CT 03/01/2022 FINDINGS: Right chest wall porta catheter tip again overlies the central superior vena cava. Cardiac silhouette is again at the upper limits of normal size for AP technique. There is  again enlargement of the bilateral hila, noting bilateral calcified lymphadenopathy on prior 03/01/2022 PET-CT. Mildly decreased lung volumes. The lateral view there is associated posterior basilar airspace opacity, possible atelectasis. Otherwise, no focal airspace opacity on frontal view to indicate pneumonia. On lateral view there appear to be small bilateral pleural effusions. No pneumothorax. Moderate multilevel degenerative disc changes of the thoracic spine. IMPRESSION: Small bilateral pleural effusions. Likely associated subsegmental atelectasis. Electronically Signed   By: Yvonne Kendall M.D.   On: 04/18/2022 08:19     Recent Results (from the past 240 hour(s))  Aerobic Culture w Gram Stain (superficial specimen)     Status: None   Collection Time: 04/14/22  7:48 AM   Specimen: Other Source; Wound  Result Value Ref Range Status   Specimen Description OTHER  Final   Special Requests Immunocompromised  Final   Gram Stain   Final    NO WBC SEEN ABUNDANT GRAM NEGATIVE RODS MODERATE GRAM POSITIVE COCCI FEW GRAM POSITIVE RODS    Culture   Final    ABUNDANT KLEBSIELLA PNEUMONIAE ABUNDANT PROTEUS MIRABILIS RARE PEDIOCOCCUS PENTOSACEUS Standardized susceptibility testing for this organism is not available. Performed at [facility], [place] [address], [place], [place] [address]    Report Status 04/16/2022 FINAL  Final   Organism ID, Bacteria KLEBSIELLA PNEUMONIAE  Final   Organism ID, Bacteria PROTEUS MIRABILIS  Final      Susceptibility   Klebsiella pneumoniae - MIC*    AMPICILLIN RESISTANT Resistant     CEFAZOLIN <=4 SENSITIVE Sensitive     CEFEPIME <=0.12 SENSITIVE Sensitive     CEFTAZIDIME <=1 SENSITIVE Sensitive     CEFTRIAXONE <=0.25 SENSITIVE Sensitive     CIPROFLOXACIN <=0.25 SENSITIVE Sensitive  GENTAMICIN <=1 SENSITIVE Sensitive     IMIPENEM <=0.25 SENSITIVE Sensitive     TRIMETH/SULFA <=20 SENSITIVE Sensitive     AMPICILLIN/SULBACTAM 4 SENSITIVE Sensitive      PIP/TAZO <=4 SENSITIVE Sensitive     * ABUNDANT KLEBSIELLA PNEUMONIAE   Proteus mirabilis - MIC*    AMPICILLIN <=2 SENSITIVE Sensitive     CEFAZOLIN <=4 SENSITIVE Sensitive     CEFEPIME <=0.12 SENSITIVE Sensitive     CEFTAZIDIME <=1 SENSITIVE Sensitive     CEFTRIAXONE <=0.25 SENSITIVE Sensitive     CIPROFLOXACIN <=0.25 SENSITIVE Sensitive     GENTAMICIN <=1 SENSITIVE Sensitive     IMIPENEM 2 SENSITIVE Sensitive     TRIMETH/SULFA <=20 SENSITIVE Sensitive     AMPICILLIN/SULBACTAM <=2 SENSITIVE Sensitive     PIP/TAZO <=4 SENSITIVE Sensitive     * ABUNDANT PROTEUS MIRABILIS  Culture, blood (Routine X 2) w Reflex to ID Panel     Status: None   Collection Time: 04/14/22  8:27 AM   Specimen: BLOOD  Result Value Ref Range Status   Specimen Description BLOOD PORTA CATH  Final   Special Requests   Final    BOTTLES DRAWN AEROBIC AND ANAEROBIC Blood Culture adequate volume   Culture   Final    NO GROWTH 5 DAYS Performed at [facility], [address], [place], [place] [address]    Report Status 04/19/2022 FINAL  Final  Culture, blood (Routine X 2) w Reflex to ID Panel     Status: None   Collection Time: 04/14/22  8:30 AM   Specimen: BLOOD  Result Value Ref Range Status   Specimen Description BLOOD RIGHT ANTECUBITAL  Final   Special Requests   Final    BOTTLES DRAWN AEROBIC AND ANAEROBIC Blood Culture adequate volume   Culture   Final    NO GROWTH 5 DAYS Performed at [facility], [place] [address], [place], [place] [address]    Report Status 04/19/2022 FINAL  Final  Culture, blood (Routine X 2) w Reflex to ID Panel     Status: None (Preliminary result)   Collection Time: 04/17/22  3:07 AM   Specimen: BLOOD  Result Value Ref Range Status   Specimen Description BLOOD SITE NOT SPECIFIED  Final   Special Requests   Final    BOTTLES DRAWN AEROBIC AND ANAEROBIC Blood Culture results may not be optimal due to an inadequate volume of blood received in culture bottles   Culture    Final    NO GROWTH 2 DAYS Performed at [facility], [place] [address], [place], [place] [address]    Report Status PENDING  Incomplete  Culture, blood (Routine X 2) w Reflex to ID Panel     Status: None (Preliminary result)   Collection Time: 04/17/22  3:10 AM   Specimen: BLOOD  Result Value Ref Range Status   Specimen Description BLOOD SITE NOT SPECIFIED  Final   Special Requests   Final    BOTTLES DRAWN AEROBIC AND ANAEROBIC Blood Culture adequate volume   Culture   Final    NO GROWTH 2 DAYS Performed at [facility], [place] [address], [place], [place] [address]    Report Status PENDING  Incomplete  Body fluid culture w Gram Stain     Status: None (Preliminary result)   Collection Time: 04/19/22 12:45 PM   Specimen: Lung, Left; Pleural Fluid  Result Value Ref Range Status   Specimen Description PLEURAL  Final  Special Requests LEFT LUNG  Final   Gram Stain   Final    ABUNDANT WBC PRESENT, PREDOMINANTLY PMN NO ORGANISMS SEEN Performed at [facility], [place] [address], [place], [place] [address]    Culture PENDING  Incomplete   Report Status PENDING  Incomplete        Assessment/Plan: S/P Procedure(s) (LRB): XI ROBOTIC ASSISTED IVOR LEWIS ESOPHAGECTOMY (N/A) XI ROBOTIC ASSISTED JEJUNOSTOMY TUBE PLACEMENT (N/A) ESOPHAGOGASTRODUODENOSCOPY (EGD) (N/A) NODE DISSECTION INTERCOSTAL NERVE BLOCK POD#15  1 Tmax 99.2, VSS, sinus rhythm/tachy 2 sats good on 4 liters 3 good UOP 4 bulb 10 cc 5 CXR improved L effus- 1 liter removed by thoracentesis 6 no new labs, Blood cx- neg so far, pleural fluid- + PMN's- no organisms- cont Zosyn 7 cont therapies for rehab 8 diabetes coordinator assisting with DM2 control 9 recheck labs in am 110 D/C IVF     LOS: 15 days    [name] PA-C Pager [phone] 04/20/2022   Agree with above [place]

## 2022-04-21 LAB — CBC WITH DIFFERENTIAL/PLATELET
Abs Immature Granulocytes: 0.07 10*3/uL (ref 0.00–0.07)
Basophils Absolute: 0 10*3/uL (ref 0.0–0.1)
Basophils Relative: 0 %
Eosinophils Absolute: 0.1 10*3/uL (ref 0.0–0.5)
Eosinophils Relative: 2 %
HCT: 21.8 % — ABNORMAL LOW (ref 39.0–52.0)
Hemoglobin: 7.2 g/dL — ABNORMAL LOW (ref 13.0–17.0)
Immature Granulocytes: 1 %
Lymphocytes Relative: 3 %
Lymphs Abs: 0.2 10*3/uL — ABNORMAL LOW (ref 0.7–4.0)
MCH: 31.3 pg (ref 26.0–34.0)
MCHC: 33 g/dL (ref 30.0–36.0)
MCV: 94.8 fL (ref 80.0–100.0)
Monocytes Absolute: 0.4 10*3/uL (ref 0.1–1.0)
Monocytes Relative: 6 %
Neutro Abs: 5.7 10*3/uL (ref 1.7–7.7)
Neutrophils Relative %: 88 %
Platelets: 337 10*3/uL (ref 150–400)
RBC: 2.3 MIL/uL — ABNORMAL LOW (ref 4.22–5.81)
RDW: 13.5 % (ref 11.5–15.5)
WBC: 6.5 10*3/uL (ref 4.0–10.5)
nRBC: 0 % (ref 0.0–0.2)

## 2022-04-21 LAB — GLUCOSE, CAPILLARY
Glucose-Capillary: 114 mg/dL — ABNORMAL HIGH (ref 70–99)
Glucose-Capillary: 117 mg/dL — ABNORMAL HIGH (ref 70–99)
Glucose-Capillary: 122 mg/dL — ABNORMAL HIGH (ref 70–99)
Glucose-Capillary: 149 mg/dL — ABNORMAL HIGH (ref 70–99)
Glucose-Capillary: 199 mg/dL — ABNORMAL HIGH (ref 70–99)
Glucose-Capillary: 230 mg/dL — ABNORMAL HIGH (ref 70–99)

## 2022-04-21 LAB — BASIC METABOLIC PANEL
Anion gap: 5 (ref 5–15)
BUN: 25 mg/dL — ABNORMAL HIGH (ref 8–23)
CO2: 30 mmol/L (ref 22–32)
Calcium: 10.5 mg/dL — ABNORMAL HIGH (ref 8.9–10.3)
Chloride: 100 mmol/L (ref 98–111)
Creatinine, Ser: 0.86 mg/dL (ref 0.61–1.24)
GFR, Estimated: >60 mL/min (ref >60)
Glucose, Bld: 236 mg/dL — ABNORMAL HIGH (ref 70–99)
Potassium: 4.4 mmol/L (ref 3.5–5.1)
Sodium: 135 mmol/L (ref 135–145)

## 2022-04-21 LAB — PREPARE RBC (CROSSMATCH)

## 2022-04-21 NOTE — Progress Notes (Addendum)
[address]       [place] [address]             [phone]      16 Days Post-Op Procedure(s) (LRB): XI ROBOTIC ASSISTED IVOR LEWIS ESOPHAGECTOMY (N/A) XI ROBOTIC ASSISTED JEJUNOSTOMY TUBE PLACEMENT (N/A) ESOPHAGOGASTRODUODENOSCOPY (EGD) (N/A) NODE DISSECTION INTERCOSTAL NERVE BLOCK Subjective: Feeling fairly well, up and eating on side of bed   Objective: Vital signs in last 24 hours: Temp:  [98.3 F (36.8 C)-98.6 F (37 C)] 98.6 F (37 C) (10/13 0409) Pulse Rate:  [87-95] 92 (10/13 0409) Cardiac Rhythm: Normal sinus rhythm (10/12 2015) Resp:  [24-35] 24 (10/13 0409) BP: (118-143)/(60-87) 129/60 (10/13 0409) SpO2:  [96 %-100 %] 96 % (10/13 0409) Weight:  [150.7 kg] 150.7 kg (10/13 0409)  Hemodynamic parameters for last 24 hours:    Intake/Output from previous day: 10/12 0701 - 10/13 0700 In: 3490.7 [P.O.:240; I.V.:60; NG/GT:3100.7] Out: 1310 [Urine:1300; Drains:10] Intake/Output this shift: No intake/output data recorded.  General appearance: alert, cooperative, and no distress Heart: regular rate and rhythm Lungs: clear to auscultation bilaterally Abdomen: some tenderness around J tube, o/w benign Extremities: + edema Wound: no changes from prev exam  Lab Results:   BMET:  Recent Labs    04/19/22 0500 04/21/22 0425  NA 135 135  K 4.2 4.4  CL 100 100  CO2 28 30  GLUCOSE 209* 236*  BUN 23 25*  CREATININE 0.87 0.86  CALCIUM 10.5* 10.5*    PT/INR: No results for input(s): "LABPROT", "INR" in the last 72 hours. ABG    Component Value Date/Time   PHART 7.41 04/06/2022 0332   HCO3 24.1 04/06/2022 0332   TCO2 28 11/13/2021 2122   ACIDBASEDEF 0.3 04/06/2022 0332   O2SAT 98 04/06/2022 0332   CBG (last 3)  Recent Labs    04/20/22 1955 04/20/22 2344 04/21/22 0413  GLUCAP 137* 157* 230*    Meds Scheduled Meds:  Chlorhexidine Gluconate Cloth  6 each Topical Daily   ciprofloxacin  500 mg Oral BID   docusate  100 mg Per  Tube BID   enoxaparin (LOVENOX) injection  75 mg Subcutaneous Q24H   feeding supplement (GLUCERNA SHAKE)  237 mL Oral TID BM   feeding supplement (OSMOLITE 1.5 CAL)  1,000 mL Per Tube Q24H   feeding supplement (PROSource TF20)  60 mL Per Tube BID   free water  200 mL Per Tube Q4H   Gerhardt's butt cream   Topical BID   insulin aspart  0-24 Units Subcutaneous Q4H   levalbuterol  0.63 mg Nebulization BID   olopatadine  1 drop Both Eyes BID   pantoprazole (PROTONIX) IV  40 mg Intravenous Q12H   sodium chloride flush  10-40 mL Intravenous Q12H   sodium chloride flush  10-40 mL Intracatheter Q12H   sodium chloride flush  3 mL Intravenous Q12H   Continuous Infusions: PRN Meds:.acetaminophen (TYLENOL) oral liquid 160 mg/5 mL, hydrALAZINE, HYDROcodone-acetaminophen, levalbuterol, lidocaine (PF), morphine injection, ondansetron (ZOFRAN) IV, sodium chloride flush, sodium chloride flush, sodium chloride flush  Xrays DG Chest 1 View  Result Date: 04/20/2022 CLINICAL DATA:  Status post esophagectomy EXAM: CHEST  1 VIEW COMPARISON:  04/19/2022 FINDINGS: Unchanged AP portable chest radiograph status post esophagectomy. Unchanged contour of the right mediastinum reflecting a fat containing postoperative hernia. Right-sided chest tube remains in position. Cardiomegaly. Small left pleural effusion. Right chest port catheter. No new airspace opacity. IMPRESSION: Unchanged AP portable chest radiograph status post esophagectomy.  No new airspace opacity. Electronically Signed   By: Delanna Ahmadi M.D.   On: 04/20/2022 08:21   IR THORACENTESIS ASP PLEURAL SPACE W/IMG GUIDE  Result Date: 04/19/2022 INDICATION: Shortness of breath. Left-sided pleural effusion. Request for diagnostic and therapeutic thoracentesis. EXAM: ULTRASOUND GUIDED LEFT THORACENTESIS MEDICATIONS: 1% plain lidocaine, 7 mL COMPLICATIONS: None immediate. PROCEDURE: An ultrasound guided thoracentesis was thoroughly discussed with the patient and  questions answered. The benefits, risks, alternatives and complications were also discussed. The patient understands and wishes to proceed with the procedure. Written consent was obtained. Ultrasound was performed to localize and mark an adequate pocket of fluid in the left chest. The area was then prepped and draped in the normal sterile fashion. 1% Lidocaine was used for local anesthesia. Under ultrasound guidance a 6 Fr Safe-T-Centesis catheter was introduced. Thoracentesis was performed. The catheter was removed and a dressing applied. FINDINGS: A total of approximately 1.1 L of serosanguineous pleural fluid was removed. Samples were sent to the laboratory as requested by the clinical team. IMPRESSION: Successful ultrasound guided left thoracentesis yielding 1.1 L of pleural fluid. Read by: Ascencion Dike PA-C Electronically Signed   By: Sandi Mariscal M.D.   On: 04/19/2022 13:30   DG Chest 1 View  Result Date: 04/19/2022 CLINICAL DATA:  Pleural effusion on left.  Post thoracentesis. EXAM: CHEST  1 VIEW COMPARISON:  Chest two views 04/18/2022, CT chest 04/18/2022, PET-CT 09/26/2021 FINDINGS: Right chest wall porta catheter tip overlies the central superior vena cava. Heart size is mildly enlarged. There is again widening of the mediastinum and bilateral hilar contours, consistent with the bilateral calcified lymphadenopathy on prior 03/01/2022 PET-CT. On frontal view, no significant pleural effusion is seen, however note is made that a moderate pleural effusion was seen on yesterday's chest CT and only visualized on yesterday's lateral chest radiograph. No pneumothorax is seen. No acute skeletal abnormality. IMPRESSION: 1. No significant pleural effusion is seen following thoracentesis, however note is made that a moderate pleural effusion was seen on yesterday's chest CT and this was only visualized on yesterday's lateral chest radiograph. 2. No pneumothorax. Electronically Signed   By: Yvonne Kendall M.D.    On: 04/19/2022 12:49     Recent Results (from the past 240 hour(s))  Aerobic Culture w Gram Stain (superficial specimen)     Status: None   Collection Time: 04/14/22  7:48 AM   Specimen: Other Source; Wound  Result Value Ref Range Status   Specimen Description OTHER  Final   Special Requests Immunocompromised  Final   Gram Stain   Final    NO WBC SEEN ABUNDANT GRAM NEGATIVE RODS MODERATE GRAM POSITIVE COCCI FEW GRAM POSITIVE RODS    Culture   Final    ABUNDANT KLEBSIELLA PNEUMONIAE ABUNDANT PROTEUS MIRABILIS RARE PEDIOCOCCUS PENTOSACEUS Standardized susceptibility testing for this organism is not available. Performed at [facility], [place] [address] [place], [place] [address]    Report Status 04/16/2022 FINAL  Final   Organism ID, Bacteria KLEBSIELLA PNEUMONIAE  Final   Organism ID, Bacteria PROTEUS MIRABILIS  Final      Susceptibility   Klebsiella pneumoniae - MIC*    AMPICILLIN RESISTANT Resistant     CEFAZOLIN <=4 SENSITIVE Sensitive     CEFEPIME <=0.12 SENSITIVE Sensitive     CEFTAZIDIME <=1 SENSITIVE Sensitive     CEFTRIAXONE <=0.25 SENSITIVE Sensitive     CIPROFLOXACIN <=0.25 SENSITIVE Sensitive     GENTAMICIN <=1 SENSITIVE Sensitive     IMIPENEM <=0.25  SENSITIVE Sensitive     TRIMETH/SULFA <=20 SENSITIVE Sensitive     AMPICILLIN/SULBACTAM 4 SENSITIVE Sensitive     PIP/TAZO <=4 SENSITIVE Sensitive     * ABUNDANT KLEBSIELLA PNEUMONIAE   Proteus mirabilis - MIC*    AMPICILLIN <=2 SENSITIVE Sensitive     CEFAZOLIN <=4 SENSITIVE Sensitive     CEFEPIME <=0.12 SENSITIVE Sensitive     CEFTAZIDIME <=1 SENSITIVE Sensitive     CEFTRIAXONE <=0.25 SENSITIVE Sensitive     CIPROFLOXACIN <=0.25 SENSITIVE Sensitive     GENTAMICIN <=1 SENSITIVE Sensitive     IMIPENEM 2 SENSITIVE Sensitive     TRIMETH/SULFA <=20 SENSITIVE Sensitive     AMPICILLIN/SULBACTAM <=2 SENSITIVE Sensitive     PIP/TAZO <=4 SENSITIVE Sensitive     * ABUNDANT PROTEUS MIRABILIS  Culture, blood  (Routine X 2) w Reflex to ID Panel     Status: None   Collection Time: 04/14/22  8:27 AM   Specimen: BLOOD  Result Value Ref Range Status   Specimen Description BLOOD PORTA CATH  Final   Special Requests   Final    BOTTLES DRAWN AEROBIC AND ANAEROBIC Blood Culture adequate volume   Culture   Final    NO GROWTH 5 DAYS Performed at [facility], [address], [place], [place] [address]    Report Status 04/19/2022 FINAL  Final  Culture, blood (Routine X 2) w Reflex to ID Panel     Status: None   Collection Time: 04/14/22  8:30 AM   Specimen: BLOOD  Result Value Ref Range Status   Specimen Description BLOOD RIGHT ANTECUBITAL  Final   Special Requests   Final    BOTTLES DRAWN AEROBIC AND ANAEROBIC Blood Culture adequate volume   Culture   Final    NO GROWTH 5 DAYS Performed at [facility], [place] [address], [place], [place] [address]    Report Status 04/19/2022 FINAL  Final  Culture, blood (Routine X 2) w Reflex to ID Panel     Status: None (Preliminary result)   Collection Time: 04/17/22  3:07 AM   Specimen: BLOOD  Result Value Ref Range Status   Specimen Description BLOOD SITE NOT SPECIFIED  Final   Special Requests   Final    BOTTLES DRAWN AEROBIC AND ANAEROBIC Blood Culture results may not be optimal due to an inadequate volume of blood received in culture bottles   Culture   Final    NO GROWTH 3 DAYS Performed at [facility], [place] [address] [place], [place] [address]    Report Status PENDING  Incomplete  Culture, blood (Routine X 2) w Reflex to ID Panel     Status: None (Preliminary result)   Collection Time: 04/17/22  3:10 AM   Specimen: BLOOD  Result Value Ref Range Status   Specimen Description BLOOD SITE NOT SPECIFIED  Final   Special Requests   Final    BOTTLES DRAWN AEROBIC AND ANAEROBIC Blood Culture adequate volume   Culture   Final    NO GROWTH 3 DAYS Performed at [facility], [address], [place],  [address]    Report  Status PENDING  Incomplete  Body fluid culture w Gram Stain     Status: None (Preliminary result)   Collection Time: 04/19/22 12:45 PM   Specimen: Lung, Left; Pleural Fluid  Result Value Ref Range Status   Specimen Description PLEURAL  Final   Special Requests LEFT LUNG  Final   Gram  Stain   Final    ABUNDANT WBC PRESENT, PREDOMINANTLY PMN NO ORGANISMS SEEN    Culture   Final    NO GROWTH < 24 HOURS Performed at [facility] [address], [place], [place] [address]    Report Status PENDING  Incomplete    Assessment/Plan: S/P Procedure(s) (LRB): XI ROBOTIC ASSISTED IVOR LEWIS ESOPHAGECTOMY (N/A) XI ROBOTIC ASSISTED JEJUNOSTOMY TUBE PLACEMENT (N/A) ESOPHAGOGASTRODUODENOSCOPY (EGD) (N/A) NODE DISSECTION INTERCOSTAL NERVE BLOCK  POD#16  1 afeb, VSS , RR elev at timessinus w 1 deg block, some PVC's 2 sats good on 3 liters, cont pulm meds/hygiene, wean O2 3 good UOP- normal BUN/Creat 4 no leukocytosis, no left shift, blood cx neg, pleural fluid, no growth 5 H/H now down to 7.2/21.8- will d/w MD poss transfusion 6 cont rehab modalities 7 zosyn changed to po cipro- observe temp curve/WBC's, clinically still with moderate purulence around J tube     LOS: 16 days    [name] PA-C Pager [phone] 04/21/2022   Agree with above Will remove chest tube Will transfuse 1 unit Dispo planning

## 2022-04-21 NOTE — TOC Progression Note (Addendum)
Transition of Care [facility]) - Progression Note    Patient Details  Name: Keith Moreno MRN: [id number] Date of Birth: 12-27-1960  [facility] CM/SW Contact  Zenon Mayo, RN Phone Number: 04/21/2022, 1:31 PM  Clinical Narrative:    This NCM is still awaiting a phone call back from the [place], left message for [place] Con[place] who are the CSW 's at the [facility], have not received a call back yet.  Per wife she has not received the DME yet from the [place].  MD states plan to dc patient on Monday. NCM spoke with patient wife, she states the only thing she has received is the tube feeding pole and the Jevity tube feed.  The Hospital bed and the shower chair and the rollator has not been delivered.    1545 - NCM received call from [place] with [facility], she states she was not avoiding my calls she has just been very busy .  She states she will have to look to see if they have the order for the hospital bed ,rollator and shower chair, she states lookls like they have it, so they will have to order it and it will take up to 5  business days .  Patient will need the hospital bed in order to be elevated to do the tube feeds.  [facility].  This NCM sent the order for the [facility]  by scanning it in to them , this way they will get it faster.  NCM informed the MD of this information and the Staff RN , the hospital bed will not be there on Monday.  It will take up to 5 days before hospital bed is delivered.    16 15 NCM received call from [place] stating they did not get the order for the hospital bed , shower chair and rollator, this NCM scanned orders in to them.  She states it will still take around 5 business days to get and to be delivered.           Expected Discharge Plan and Services                                                 Social Determinants of Health (SDOH) Interventions    Readmission Risk  Interventions     No data to display

## 2022-04-21 NOTE — Progress Notes (Signed)
Mobility Specialist Progress Note    04/21/22 1444  Mobility  Activity Ambulated with assistance in hallway  Level of Assistance Contact guard assist, steadying assist  Assistive Device Four wheel walker  Distance Ambulated (ft) 300 ft (150+150)  Activity Response Tolerated well  Mobility Referral Yes  $Mobility charge 1 Mobility   Pre-Mobility: 96 HR, 96% SpO2 During Mobility: 121 HR, 94% SpO2 Post-Mobility: 100 HR, 90% SpO2  Pt received in bed and agreeable. No complaints. Started on 2LO2. Took x1 extended seated rest break. Pt moved to RA once seated for rest break. Encouraged pursed lip breathing. SpO2 maintained >/=90% for the rest of session.  Left in chair with call bell in reach on RA. RN notified.   Hildred Alamin Mobility Specialist  Secure Chat Only

## 2022-04-21 NOTE — Progress Notes (Signed)
Physical Therapy Treatment Patient Details Name: Keith Moreno MRN: [id number] DOB: 10/30/1960 Today's Date: 04/21/2022   History of Present Illness 61 yo male admitted 9/27 for esophagectomy with J tube placement due to esophageal CA. PMhx: chronic low back pain, esophageal CA    PT Comments    Pt with continued good progress toward acute goals. Session focused on continued gait training for increased activity tolerance and safe stair negotiation. Pt able to ascend/descend 3 steps in stairwell with good technique with no LOB and min guard for safety. Pt able to complete x2 gait bouts with seated recovery between at supervision level with pt demonstrating good safety awareness with rolaltor and good self pacing. SpO2 >92% throughout activity on 2L. Pt continues to benefit from skilled PT services to progress toward functional mobility goals.    Recommendations for follow up therapy are one component of a multi-disciplinary discharge planning process, led by the attending physician.  Recommendations may be updated based on patient status, additional functional criteria and insurance authorization.  Follow Up Recommendations  Home health PT     Assistance Recommended at Discharge Intermittent Supervision/Assistance  Patient can return home with the following A little help with walking and/or transfers;A little help with bathing/dressing/bathroom;Assistance with cooking/housework;Assist for transportation;Help with stairs or ramp for entrance   Equipment Recommendations  Rollator (4 wheels)    Recommendations for Other Services       Precautions / Restrictions Precautions Precautions: Fall;Other (comment) Precaution Comments: JP drain rt flank, PEG Restrictions Weight Bearing Restrictions: No     Mobility  Bed Mobility               General bed mobility comments: in chair    Transfers Overall transfer level: Needs assistance Equipment used: Rollator (4  wheels) Transfers: Sit to/from Stand, Bed to chair/wheelchair/BSC Sit to Stand: Min guard   Step pivot transfers: Min guard       General transfer comment: min guard for safety from low recliner and rolaltor    Ambulation/Gait Ambulation/Gait assistance: Min guard, Supervision Gait Distance (Feet): 120 Feet (+ 85') Assistive device: Rollator (4 wheels) Gait Pattern/deviations: Step-through pattern, Decreased stride length Gait velocity: decr     General Gait Details: pt on 2L with SpO2 >93%. distance continues to be limited by fatigue, pt continues to demonstrate safe technique and good safety awarness with rollator   Stairs Stairs: Yes Stairs assistance: Min guard Stair Management: One rail Right, Sideways, Step to pattern Number of Stairs: 3 General stair comments: no LOB, pt turning sideways to have bil hands on rail with good return   Wheelchair Mobility    Modified Rankin (Stroke Patients Only)       Balance Overall balance assessment: Needs assistance Sitting-balance support: Feet supported Sitting balance-Leahy Scale: Good     Standing balance support: Bilateral upper extremity supported, Reliant on assistive device for balance Standing balance-Leahy Scale: Fair Standing balance comment: can static stand without UE support                            Cognition Arousal/Alertness: Awake/alert Behavior During Therapy: WFL for tasks assessed/performed Overall Cognitive Status: Within Functional Limits for tasks assessed                                          Exercises      General  Comments General comments (skin integrity, edema, etc.): SpO2 >92% during activity on 2L      Pertinent Vitals/Pain Pain Assessment Pain Assessment: No/denies pain    Home Living                          Prior Function            PT Goals (current goals can now be found in the care plan section) Acute Rehab PT Goals PT Goal  Formulation: With patient Time For Goal Achievement: 04/25/22    Frequency    Min 3X/week      PT Plan Current plan remains appropriate    Co-evaluation              AM-PAC PT "6 Clicks" Mobility   Outcome Measure  Help needed turning from your back to your side while in a flat bed without using bedrails?: A Little Help needed moving from lying on your back to sitting on the side of a flat bed without using bedrails?: A Little Help needed moving to and from a bed to a chair (including a wheelchair)?: A Little Help needed standing up from a chair using your arms (e.g., wheelchair or bedside chair)?: A Little Help needed to walk in hospital room?: A Little Help needed climbing 3-5 steps with a railing? : A Lot 6 Click Score: 17    End of Session Equipment Utilized During Treatment: Oxygen Activity Tolerance: Patient limited by fatigue;Patient tolerated treatment well Patient left: in chair;with call bell/phone within reach;with family/visitor present Nurse Communication: Mobility status PT Visit Diagnosis: Other abnormalities of gait and mobility (R26.89);Difficulty in walking, not elsewhere classified (R26.2);Muscle weakness (generalized) (M62.81)     Time: 1914-7829 PT Time Calculation (min) (ACUTE ONLY): 33 min  Charges:  $Gait Training: 23-37 mins                     Rayleen Wyrick R. PTA Acute Rehabilitation Services Office: [phone]   Catalina Antigua 04/21/2022, 11:20 AM

## 2022-04-21 NOTE — Plan of Care (Signed)
  Problem: Education: Goal: Knowledge of General Education information will improve Description: Including pain rating scale, medication(s)/side effects and non-pharmacologic comfort measures Outcome: Progressing   Problem: Health Behavior/Discharge Planning: Goal: Ability to manage health-related needs will improve Outcome: Progressing   Problem: Clinical Measurements: Goal: Ability to maintain clinical measurements within normal limits will improve Outcome: Progressing Goal: Will remain free from infection Outcome: Progressing Goal: Diagnostic test results will improve Outcome: Progressing Goal: Respiratory complications will improve Outcome: Progressing Goal: Cardiovascular complication will be avoided Outcome: Progressing   Problem: Activity: Goal: Risk for activity intolerance will decrease Outcome: Progressing   Problem: Nutrition: Goal: Adequate nutrition will be maintained Outcome: Progressing   Problem: Coping: Goal: Level of anxiety will decrease Outcome: Progressing   Problem: Elimination: Goal: Will not experience complications related to bowel motility Outcome: Progressing Goal: Will not experience complications related to urinary retention Outcome: Progressing   Problem: Pain Managment: Goal: General experience of comfort will improve Outcome: Progressing   Problem: Safety: Goal: Ability to remain free from injury will improve Outcome: Progressing   Problem: Skin Integrity: Goal: Risk for impaired skin integrity will decrease Outcome: Progressing   Problem: Education: Goal: Knowledge of the prescribed therapeutic regimen will improve Outcome: Progressing   Problem: Bowel/Gastric: Goal: Gastrointestinal status for postoperative course will improve Outcome: Progressing   Problem: Nutritional: Goal: Ability to achieve adequate nutritional intake will improve Outcome: Progressing   Problem: Clinical Measurements: Goal: Postoperative  complications will be avoided or minimized Outcome: Progressing   Problem: Respiratory: Goal: Ability to maintain a clear airway will improve Outcome: Progressing   Problem: Skin Integrity: Goal: Demonstration of wound healing without infection will improve Outcome: Progressing   Problem: Safety: Goal: Non-violent Restraint(s) Outcome: Progressing

## 2022-04-22 LAB — GLUCOSE, CAPILLARY
Glucose-Capillary: 189 mg/dL — ABNORMAL HIGH (ref 70–99)
Glucose-Capillary: 120 mg/dL — ABNORMAL HIGH (ref 70–99)
Glucose-Capillary: 141 mg/dL — ABNORMAL HIGH (ref 70–99)
Glucose-Capillary: 105 mg/dL — ABNORMAL HIGH (ref 70–99)
Glucose-Capillary: 103 mg/dL — ABNORMAL HIGH (ref 70–99)
Glucose-Capillary: 165 mg/dL — ABNORMAL HIGH (ref 70–99)

## 2022-04-22 LAB — TYPE AND SCREEN
ABO/RH(D): O POS
Antibody Screen: NEGATIVE
Unit division: 0

## 2022-04-22 LAB — BPAM RBC
Blood Product Expiration Date: 202311152359
ISSUE DATE / TIME: 202310131954
Unit Type and Rh: 5100

## 2022-04-22 LAB — CULTURE, BLOOD (ROUTINE X 2)
Culture: NO GROWTH
Culture: NO GROWTH
Special Requests: ADEQUATE

## 2022-04-22 LAB — CBC
HCT: 25.6 % — ABNORMAL LOW (ref 39.0–52.0)
Hemoglobin: 8 g/dL — ABNORMAL LOW (ref 13.0–17.0)
MCH: 30.1 pg (ref 26.0–34.0)
MCHC: 31.3 g/dL (ref 30.0–36.0)
MCV: 96.2 fL (ref 80.0–100.0)
Platelets: 346 10*3/uL (ref 150–400)
RBC: 2.66 MIL/uL — ABNORMAL LOW (ref 4.22–5.81)
RDW: 13.9 % (ref 11.5–15.5)
WBC: 6.1 10*3/uL (ref 4.0–10.5)
nRBC: 0 % (ref 0.0–0.2)

## 2022-04-22 LAB — BODY FLUID CULTURE W GRAM STAIN: Culture: NO GROWTH

## 2022-04-22 NOTE — Plan of Care (Signed)
  Problem: Safety: Goal: Non-violent Restraint(s) 04/22/2022 0331 by Ancil Linsey, RN Outcome: Completed/Met 04/22/2022 0330 by Ancil Linsey, RN Outcome: Progressing

## 2022-04-22 NOTE — Plan of Care (Signed)
  Problem: Education: Goal: Knowledge of General Education information will improve Description: Including pain rating scale, medication(s)/side effects and non-pharmacologic comfort measures Outcome: Progressing   Problem: Health Behavior/Discharge Planning: Goal: Ability to manage health-related needs will improve Outcome: Progressing   Problem: Clinical Measurements: Goal: Ability to maintain clinical measurements within normal limits will improve Outcome: Progressing Goal: Will remain free from infection Outcome: Progressing Goal: Diagnostic test results will improve Outcome: Progressing Goal: Respiratory complications will improve Outcome: Progressing Goal: Cardiovascular complication will be avoided Outcome: Progressing   Problem: Activity: Goal: Risk for activity intolerance will decrease Outcome: Progressing   Problem: Nutrition: Goal: Adequate nutrition will be maintained Outcome: Progressing   Problem: Coping: Goal: Level of anxiety will decrease Outcome: Progressing   Problem: Elimination: Goal: Will not experience complications related to bowel motility Outcome: Progressing Goal: Will not experience complications related to urinary retention Outcome: Progressing   Problem: Pain Managment: Goal: General experience of comfort will improve Outcome: Progressing   Problem: Safety: Goal: Ability to remain free from injury will improve Outcome: Progressing   Problem: Skin Integrity: Goal: Risk for impaired skin integrity will decrease Outcome: Progressing   Problem: Education: Goal: Knowledge of the prescribed therapeutic regimen will improve Outcome: Progressing   Problem: Bowel/Gastric: Goal: Gastrointestinal status for postoperative course will improve Outcome: Progressing   Problem: Nutritional: Goal: Ability to achieve adequate nutritional intake will improve Outcome: Progressing   Problem: Clinical Measurements: Goal: Postoperative  complications will be avoided or minimized Outcome: Progressing   Problem: Respiratory: Goal: Ability to maintain a clear airway will improve Outcome: Progressing   Problem: Skin Integrity: Goal: Demonstration of wound healing without infection will improve Outcome: Progressing   Problem: Safety: Goal: Non-violent Restraint(s) Outcome: Progressing

## 2022-04-22 NOTE — Progress Notes (Signed)
Mobility Specialist Progress Note    04/22/22 1324  Mobility  Activity Ambulated with assistance in hallway  Level of Assistance Standby assist, set-up cues, supervision of patient - no hands on  Assistive Device Four wheel walker  Distance Ambulated (ft) 350 ft  Activity Response Tolerated well  Mobility Referral Yes  $Mobility charge 1 Mobility   Pre-Mobility: 92 HR, 92% SpO2 During Mobility: 114 HR, >/=90% SpO2 Post-Mobility: 101 HR, 92% SpO2  Pt received in bed and agreeable. No complaints on walk. Tolerated on RA. Took x2 seated rest breaks. Returned to bed with call bell in reach.    Hildred Alamin Mobility Specialist  Secure Chat Only

## 2022-04-22 NOTE — Progress Notes (Addendum)
      [address]       [place] [address]             [phone]      17 Days Post-Op Procedure(s) (LRB): XI ROBOTIC ASSISTED IVOR LEWIS ESOPHAGECTOMY (N/A) XI ROBOTIC ASSISTED JEJUNOSTOMY TUBE PLACEMENT (N/A) ESOPHAGOGASTRODUODENOSCOPY (EGD) (N/A) NODE DISSECTION INTERCOSTAL NERVE BLOCK  Subjective:  Sitting up in chair.  Doing okay, tolerating diet.  States he is stools are thickening up.    Objective: Vital signs in last 24 hours: Temp:  [97.6 F (36.4 C)-99.3 F (37.4 C)] 97.6 F (36.4 C) (10/14 0755) Pulse Rate:  [87-100] 94 (10/14 0755) Cardiac Rhythm: Normal sinus rhythm;Bundle branch block;Heart block (10/14 0756) Resp:  [16-21] 16 (10/14 0755) BP: (129-140)/(68-73) 132/73 (10/14 0755) SpO2:  [89 %-97 %] 94 % (10/14 0755) Weight:  [149.3 kg] 149.3 kg (10/14 0634)  Intake/Output from previous day: 10/13 0701 - 10/14 0700 In: 1931.8 [P.O.:240; Blood:315; NG/GT:1376.8] Out: 2425 [Urine:2425]  General appearance: alert, cooperative, and no distress Heart: regular rate and rhythm Lungs: clear to auscultation bilaterally Abdomen: mild tenderness, purulence persists around J Tube Extremities: extremities normal, atraumatic, no cyanosis or edema Wound: stable  Lab Results: Recent Labs    04/21/22 0425 04/22/22 0452  WBC 6.5 6.1  HGB 7.2* 8.0*  HCT 21.8* 25.6*  PLT 337 346   BMET:  Recent Labs    04/21/22 0425  NA 135  K 4.4  CL 100  CO2 30  GLUCOSE 236*  BUN 25*  CREATININE 0.86  CALCIUM 10.5*    PT/INR: No results for input(s): "LABPROT", "INR" in the last 72 hours. ABG    Component Value Date/Time   PHART 7.41 04/06/2022 0332   HCO3 24.1 04/06/2022 0332   TCO2 28 11/13/2021 2122   ACIDBASEDEF 0.3 04/06/2022 0332   O2SAT 98 04/06/2022 0332   CBG (last 3)  Recent Labs    04/21/22 1949 04/21/22 2330 04/22/22 0421  GLUCAP 117* 199* 189*    Assessment/Plan: S/P Procedure(s) (LRB): XI ROBOTIC ASSISTED IVOR LEWIS  ESOPHAGECTOMY (N/A) XI ROBOTIC ASSISTED JEJUNOSTOMY TUBE PLACEMENT (N/A) ESOPHAGOGASTRODUODENOSCOPY (EGD) (N/A) NODE DISSECTION INTERCOSTAL NERVE BLOCK  CV-NSR with PVCs Pulm- wean off oxygen, continue IS ID- post operative abscess, culture positive from around J Tube site for Proteus, Klebsiella, Pediococcus- continue Cipro GI- tube feeds running nightly, continue oral intake as tolerated Deconditioning- home needs are being arranged, [facility] states will take 5 business days for DME to be delivered Dispo- patient stable, continue current care, will be stable for discharge once DME is delivered to home.   LOS: 17 days   Erin Barrett, PA-C 04/22/2022  No complaints other than some difficulty controlling blood sugar.  Patient examined and agree with above assessment and plan   P Prescott Gum MD

## 2022-04-23 LAB — GLUCOSE, CAPILLARY
Glucose-Capillary: 183 mg/dL — ABNORMAL HIGH (ref 70–99)
Glucose-Capillary: 206 mg/dL — ABNORMAL HIGH (ref 70–99)
Glucose-Capillary: 119 mg/dL — ABNORMAL HIGH (ref 70–99)
Glucose-Capillary: 113 mg/dL — ABNORMAL HIGH (ref 70–99)
Glucose-Capillary: 98 mg/dL (ref 70–99)
Glucose-Capillary: 162 mg/dL — ABNORMAL HIGH (ref 70–99)

## 2022-04-23 NOTE — Plan of Care (Signed)
  Problem: Education: Goal: Knowledge of General Education information will improve Description: Including pain rating scale, medication(s)/side effects and non-pharmacologic comfort measures Outcome: Progressing   Problem: Health Behavior/Discharge Planning: Goal: Ability to manage health-related needs will improve Outcome: Progressing   Problem: Clinical Measurements: Goal: Ability to maintain clinical measurements within normal limits will improve Outcome: Progressing Goal: Will remain free from infection Outcome: Progressing Goal: Diagnostic test results will improve Outcome: Progressing Goal: Respiratory complications will improve Outcome: Progressing Goal: Cardiovascular complication will be avoided Outcome: Progressing   Problem: Activity: Goal: Risk for activity intolerance will decrease Outcome: Progressing   Problem: Nutrition: Goal: Adequate nutrition will be maintained Outcome: Progressing   Problem: Coping: Goal: Level of anxiety will decrease Outcome: Progressing   Problem: Elimination: Goal: Will not experience complications related to bowel motility Outcome: Progressing Goal: Will not experience complications related to urinary retention Outcome: Progressing   Problem: Pain Managment: Goal: General experience of comfort will improve Outcome: Progressing   Problem: Safety: Goal: Ability to remain free from injury will improve Outcome: Progressing   Problem: Skin Integrity: Goal: Risk for impaired skin integrity will decrease Outcome: Progressing   Problem: Education: Goal: Knowledge of the prescribed therapeutic regimen will improve Outcome: Progressing   Problem: Bowel/Gastric: Goal: Gastrointestinal status for postoperative course will improve Outcome: Progressing   Problem: Nutritional: Goal: Ability to achieve adequate nutritional intake will improve Outcome: Progressing   Problem: Clinical Measurements: Goal: Postoperative  complications will be avoided or minimized Outcome: Progressing   Problem: Respiratory: Goal: Ability to maintain a clear airway will improve Outcome: Progressing   Problem: Skin Integrity: Goal: Demonstration of wound healing without infection will improve Outcome: Progressing   

## 2022-04-23 NOTE — Progress Notes (Signed)
      [address]       [place] [address]             [phone]      18 Days Post-Op Procedure(s) (LRB): XI ROBOTIC ASSISTED IVOR LEWIS ESOPHAGECTOMY (N/A) XI ROBOTIC ASSISTED JEJUNOSTOMY TUBE PLACEMENT (N/A) ESOPHAGOGASTRODUODENOSCOPY (EGD) (N/A) NODE DISSECTION INTERCOSTAL NERVE BLOCK  Subjective:  Patient without complaints.  + ambulating without difficulty.  + BM  Objective: Vital signs in last 24 hours: Temp:  [97.8 F (36.6 C)-98.6 F (37 C)] 98.6 F (37 C) (10/15 0723) Pulse Rate:  [88-100] 93 (10/15 0723) Cardiac Rhythm: Heart block (10/15 0700) Resp:  [18-24] 24 (10/15 0723) BP: (117-153)/(73-82) 117/73 (10/15 0723) SpO2:  [93 %-96 %] 94 % (10/15 0723) Weight:  [150 kg-150.5 kg] 150 kg (10/15 0531)  Intake/Output from previous day: 10/14 0701 - 10/15 0700 In: 1992 [P.O.:870; NG/GT:1122] Out: 2450 [Urine:2450] Intake/Output this shift: Total I/O In: 203 [Other:60; NG/GT:143] Out: 725 [Urine:725]  General appearance: alert, cooperative, and no distress Heart: regular rate and rhythm Lungs: clear to auscultation bilaterally Abdomen: soft, non-tender; bowel sounds normal; no masses,  no organomegaly Extremities: extremities normal, atraumatic, no cyanosis or edema Wound: purulent drainage around J tube... remaining incisions C/D/I  Lab Results: Recent Labs    04/21/22 0425 04/22/22 0452  WBC 6.5 6.1  HGB 7.2* 8.0*  HCT 21.8* 25.6*  PLT 337 346   BMET:  Recent Labs    04/21/22 0425  NA 135  K 4.4  CL 100  CO2 30  GLUCOSE 236*  BUN 25*  CREATININE 0.86  CALCIUM 10.5*    PT/INR: No results for input(s): "LABPROT", "INR" in the last 72 hours. ABG    Component Value Date/Time   PHART 7.41 04/06/2022 0332   HCO3 24.1 04/06/2022 0332   TCO2 28 11/13/2021 2122   ACIDBASEDEF 0.3 04/06/2022 0332   O2SAT 98 04/06/2022 0332   CBG (last 3)  Recent Labs    04/22/22 2326 04/23/22 0209 04/23/22 0726  GLUCAP 165* 183*  206*    Assessment/Plan: S/P Procedure(s) (LRB): XI ROBOTIC ASSISTED IVOR LEWIS ESOPHAGECTOMY (N/A) XI ROBOTIC ASSISTED JEJUNOSTOMY TUBE PLACEMENT (N/A) ESOPHAGOGASTRODUODENOSCOPY (EGD) (N/A) NODE DISSECTION INTERCOSTAL NERVE BLOCK  CV- NSR PVCs Pulm- weaning oxygen as tolerated, continue IS ID- post operative abscess- purulence continues from around J tube site.Marland Kitchen afebrile, leukocytosis resolved, continue wound care, Ciprofloxacin GI- continue nightly tube feeds, currently on dysphagia diet.Marland Kitchen difficult for patient to eat as food choices are not great  CBGs elevated, not a diabetic.. A1c is 5.1..  Dispo- patient remains stable, awaiting dispo planning to be finalized.. [facility] takes 5 business days for approval/delivery of DME.. home once arrangements made   LOS: 18 days    Ellwood Handler, PA-C 04/23/2022

## 2022-04-23 NOTE — Progress Notes (Signed)
Mobility Specialist Progress Note    04/23/22 1020  Mobility  Activity Ambulated with assistance in hallway  Level of Assistance Standby assist, set-up cues, supervision of patient - no hands on  Assistive Device Four wheel walker  Distance Ambulated (ft) 420 ft  Activity Response Tolerated well  Mobility Referral Yes  $Mobility charge 1 Mobility   Pre-Mobility: 95 HR, 93% SpO2 During Mobility: 114 HR, >/=90% SpO2 Post-Mobility: 100 HR, 97% SpO2  Pt received in chair and agreeable. No complaints on walk. Took x1 seated rest break for 5 minutes. Encouraged pursed lip breathing. Tolerated on RA. Returned to chair with call bell in reach.    Hildred Alamin Mobility Specialist  Secure Chat Only

## 2022-04-23 NOTE — Progress Notes (Signed)
Mobility Specialist Progress Note    04/23/22 1554  Mobility  Activity Ambulated with assistance in hallway  Level of Assistance Standby assist, set-up cues, supervision of patient - no hands on  Assistive Device Four wheel walker  Distance Ambulated (ft) 300 ft (150+150)  Activity Response Tolerated well  Mobility Referral Yes  $Mobility charge 1 Mobility   Pre-Mobility: 101 HR, 93% SpO2 During Mobility: 112 HR Post-Mobility: 111 HR, 95% SpO2  Pt received in chair and agreeable. No complaints on walk. Took x1 seated rest break. Returned to chair with call bell in reach.    Hildred Alamin Mobility Specialist  Secure Chat Only

## 2022-04-24 LAB — GLUCOSE, CAPILLARY
Glucose-Capillary: 215 mg/dL — ABNORMAL HIGH (ref 70–99)
Glucose-Capillary: 141 mg/dL — ABNORMAL HIGH (ref 70–99)
Glucose-Capillary: 133 mg/dL — ABNORMAL HIGH (ref 70–99)
Glucose-Capillary: 118 mg/dL — ABNORMAL HIGH (ref 70–99)
Glucose-Capillary: 130 mg/dL — ABNORMAL HIGH (ref 70–99)
Glucose-Capillary: 87 mg/dL (ref 70–99)

## 2022-04-24 MED ORDER — ENSURE ENLIVE PO LIQD
237.0000 mL | Freq: Three times a day (TID) | ORAL | Status: DC
  Administered 2022-04-24 – 2022-04-27 (×5): 237 mL via ORAL

## 2022-04-24 NOTE — Progress Notes (Signed)
Mobility Specialist Progress Note    04/24/22 1620  Mobility  Activity Refused mobility   Pt c/o back pain and migraine. RN notified. Will f/u as schedule permits.   Hildred Alamin Mobility Specialist  Secure Chat Only

## 2022-04-24 NOTE — Progress Notes (Signed)
Nutrition Follow-up  DOCUMENTATION CODES:   Non-severe (moderate) malnutrition in context of chronic illness  INTERVENTION:   - 48-hour calorie count to start today at lunch meal, RD to follow up 04/25/22 with day 1 results  - Ensure Enlive po TID, each supplement provides 350 kcal and 20 grams of protein  - Pt can consume Boost VHC oral nutrition supplements from home, each supplement provides 530 kcal and 22 grams of protein  - Encourage PO intake  NUTRITION DIAGNOSIS:   Moderate Malnutrition related to chronic illness (esophageal cancer) as evidenced by mild muscle depletion, percent weight loss (9.2% weight loss in 5 months).  Ongoing, being addressed via diet advancement and oral nutrition supplements  GOAL:   Patient will meet greater than or equal to 90% of their needs  Progressing  MONITOR:   PO intake, Supplement acceptance, Diet advancement, Labs, Weight trends, TF tolerance, Skin, I & O's  REASON FOR ASSESSMENT:   Malnutrition Screening Tool, Consult Enteral/tube feeding initiation and management  ASSESSMENT:   61 year old male with PMH of esophageal adenocarcinoma stage IIb s/p neoadjuvant chemoradiation, pre-DM, HTN. Pt did not complete all of his chemotherapy due to neutropenia but did complete radiation on 02/02/22.  09/27 - s/p Ivor Lewis esophagectomy, J-tube placement 10/02 - s/p swallow study showing no leak, NG tube removed, diet advanced to dysphagia 1 with thin liquids 10/07 - diet advanced to dysphagia 3 10/11 - s/p thoracentesis with 1.1 L fluid removed 10/15 - nocturnal TF d/c  Received consult for 48-hour calorie count. Discussed with RN. Calorie count envelope on pt's door.  Spoke with pt and wife at bedside. Explained plan for calorie count and to hold on nocturnal tube feeds at this time. Pt willing to drink Ensure oral nutrition supplements; RD provided pt with one at time of visit. Pt's wife to bring in pt's preferred supplement (Boost  VHC) from home.  Admit weight: 150.3 kg Current weight: 150 kg  Pt with non-pitting edema to BUE and BLE.  Meal Completion: 0-100% x last 8 meals  Medications reviewed and include: cipro, colace, Glucerna TID, PROSource TF20 60 ml BID, SSI q 4 hours, IV protonix  Labs reviewed: hemoglobin 8.0 CBG's: 98-215 x 24 hours  UOP: 3025 ml x 24 hours I/O's: +8.5 L since admit  Diet Order:   Diet Order             DIET DYS 3 Room service appropriate? Yes; Fluid consistency: Thin  Diet effective now                   EDUCATION NEEDS:   Education needs have been addressed  Skin:  Skin Assessment: Reviewed RN Assessment (incisions to chest and abdomen)  Last BM:  04/23/22 small type 6  Height:   Ht Readings from Last 1 Encounters:  04/05/22 6' (1.829 m)    Weight:   Wt Readings from Last 1 Encounters:  04/23/22 (!) 150 kg    Ideal Body Weight:  80.9 kg  BMI:  Body mass index is 44.85 kg/m.  Estimated Nutritional Needs:   Kcal:  2500-2700  Protein:  120-140 grams  Fluid:  >/= 2.2 L    Gustavus Bryant, MS, RD, LDN Inpatient Clinical Dietitian Please see AMiON for contact information.

## 2022-04-24 NOTE — Progress Notes (Signed)
Initiated teaching the wife on how to give meds and flush the J-tube and also on how to do dressing change on the site. Wife is so perceptive  with the teaching.

## 2022-04-24 NOTE — Progress Notes (Signed)
OT Cancellation Note  Patient Details Name: Keith Moreno MRN: [id number] DOB: [birth year]   Cancelled Treatment:    Reason Eval/Treat Not Completed: Pain limiting ability to participate (Pt reporting headache, back and LE pain. Declining OOB. OT to continue efforts for treatment.)  Elliot Cousin 04/24/2022, 4:29 PM

## 2022-04-24 NOTE — Progress Notes (Signed)
Physical Therapy Treatment Patient Details Name: Keith Moreno MRN: [id number] DOB: 07/08/1961 Today's Date: 04/24/2022   History of Present Illness 61 yo male admitted 9/27 for esophagectomy with J tube placement due to esophageal CA. PMhx: chronic low back pain, esophageal CA    PT Comments    Pt with continued progress this session with focus on gait training with rolaltor for increased activity tolerance and LE strength. Pt on RA throughout session with SpO2 remaining >94% throughout. Pt needing supervision assist throughout gait training for increased distance needing x1 seated recovery brake. Pt with continued good safety awareness throughout session with rollator. Extended time spent in discussion and education re; importance of continued mobility post acutely, activity recommendations and HEP. Pt continues to benefit from skilled PT services to progress toward functional mobility goals.    Recommendations for follow up therapy are one component of a multi-disciplinary discharge planning process, led by the attending physician.  Recommendations may be updated based on patient status, additional functional criteria and insurance authorization.  Follow Up Recommendations  Home health PT     Assistance Recommended at Discharge Intermittent Supervision/Assistance  Patient can return home with the following A little help with walking and/or transfers;A little help with bathing/dressing/bathroom;Assistance with cooking/housework;Assist for transportation;Help with stairs or ramp for entrance   Equipment Recommendations  Rollator (4 wheels)    Recommendations for Other Services       Precautions / Restrictions Precautions Precautions: Fall;Other (comment) Precaution Comments: JP drain rt flank, PEG Restrictions Weight Bearing Restrictions: No     Mobility  Bed Mobility Overal bed mobility: Needs Assistance             General bed mobility comments: up in chair pre and  post session    Transfers Overall transfer level: Needs assistance Equipment used: Rollator (4 wheels) Transfers: Sit to/from Stand, Bed to chair/wheelchair/BSC Sit to Stand: Supervision, Min guard           General transfer comment: supervision from recliner, min guard with rollator to turn and sit<>stand and turn, good recall for safety locking brakes and pushing againts wall before sitting    Ambulation/Gait Ambulation/Gait assistance: Supervision Gait Distance (Feet): 420 Feet (x 1 seated rest) Assistive device: Rollator (4 wheels) Gait Pattern/deviations: Step-through pattern, Decreased stride length Gait velocity: decr     General Gait Details: x1 seated rest, increased gait speed slightly this session, SpO2 >94% on RA throughout   Stairs             Wheelchair Mobility    Modified Rankin (Stroke Patients Only)       Balance Overall balance assessment: Needs assistance Sitting-balance support: Feet supported Sitting balance-Leahy Scale: Good     Standing balance support: Bilateral upper extremity supported, Reliant on assistive device for balance Standing balance-Leahy Scale: Fair Standing balance comment: can static stand without UE support                            Cognition Arousal/Alertness: Awake/alert Behavior During Therapy: WFL for tasks assessed/performed Overall Cognitive Status: Within Functional Limits for tasks assessed                                          Exercises      General Comments        Pertinent Vitals/Pain Pain Assessment Pain Assessment: Faces  Faces Pain Scale: Hurts a little bit Pain Location: general Pain Descriptors / Indicators: Aching Pain Intervention(s): Monitored during session, Limited activity within patient's tolerance    Home Living                          Prior Function            PT Goals (current goals can now be found in the care plan section)  Acute Rehab PT Goals Patient Stated Goal: return home PT Goal Formulation: With patient Time For Goal Achievement: 04/25/22    Frequency    Min 3X/week      PT Plan Current plan remains appropriate    Co-evaluation              AM-PAC PT "6 Clicks" Mobility   Outcome Measure  Help needed turning from your back to your side while in a flat bed without using bedrails?: A Little Help needed moving from lying on your back to sitting on the side of a flat bed without using bedrails?: A Little Help needed moving to and from a bed to a chair (including a wheelchair)?: A Little Help needed standing up from a chair using your arms (e.g., wheelchair or bedside chair)?: A Little Help needed to walk in hospital room?: A Little Help needed climbing 3-5 steps with a railing? : A Lot 6 Click Score: 17    End of Session   Activity Tolerance: Patient tolerated treatment well Patient left: in chair;with call bell/phone within reach;with family/visitor present Nurse Communication: Mobility status PT Visit Diagnosis: Other abnormalities of gait and mobility (R26.89);Difficulty in walking, not elsewhere classified (R26.2);Muscle weakness (generalized) (M62.81)     Time: 1610-9604 PT Time Calculation (min) (ACUTE ONLY): 24 min  Charges:  $Therapeutic Exercise: 23-37 mins                     Erling Arrazola R. PTA Acute Rehabilitation Services Office: [phone]    Catalina Antigua 04/24/2022, 10:49 AM

## 2022-04-24 NOTE — Progress Notes (Addendum)
[address]       [place] [address]             [phone]      19 Days Post-Op Procedure(s) (LRB): XI ROBOTIC ASSISTED IVOR LEWIS ESOPHAGECTOMY (N/A) XI ROBOTIC ASSISTED JEJUNOSTOMY TUBE PLACEMENT (N/A) ESOPHAGOGASTRODUODENOSCOPY (EGD) (N/A) NODE DISSECTION INTERCOSTAL NERVE BLOCK Subjective: Does not like the food choices but overall doing well  Objective: Vital signs in last 24 hours: Temp:  [97.4 F (36.3 C)-98 F (36.7 C)] 98 F (36.7 C) (10/16 0318) Pulse Rate:  [87-99] 94 (10/16 0318) Cardiac Rhythm: Normal sinus rhythm;Bundle branch block (10/16 0737) Resp:  [17-27] 24 (10/16 0318) BP: (126-137)/(70-83) 137/74 (10/16 0318) SpO2:  [92 %-97 %] 97 % (10/16 0318)  Hemodynamic parameters for last 24 hours:    Intake/Output from previous day: 10/15 0701 - 10/16 0700 In: 960.2 [NG/GT:900.2] Out: 3025 [Urine:3025] Intake/Output this shift: No intake/output data recorded.  General appearance: alert, cooperative, and no distress Heart: regular rate and rhythm Lungs: clear to auscultation bilaterally Abdomen: Benign Extremities: No edema Wound: Incisions healing well, some drainage continues appears to be less in the region of the J-tube site  Lab Results: Recent Labs    04/22/22 0452  WBC 6.1  HGB 8.0*  HCT 25.6*  PLT 346   BMET: No results for input(s): "NA", "K", "CL", "CO2", "GLUCOSE", "BUN", "CREATININE", "CALCIUM" in the last 72 hours.  PT/INR: No results for input(s): "LABPROT", "INR" in the last 72 hours. ABG    Component Value Date/Time   PHART 7.41 04/06/2022 0332   HCO3 24.1 04/06/2022 0332   TCO2 28 11/13/2021 2122   ACIDBASEDEF 0.3 04/06/2022 0332   O2SAT 98 04/06/2022 0332   CBG (last 3)  Recent Labs    04/23/22 1917 04/23/22 2325 04/24/22 0316  GLUCAP 98 162* 215*    Meds Scheduled Meds:  Chlorhexidine Gluconate Cloth  6 each Topical Daily   ciprofloxacin  500 mg Oral BID   docusate  100 mg Per Tube  BID   enoxaparin (LOVENOX) injection  75 mg Subcutaneous Q24H   feeding supplement (GLUCERNA SHAKE)  237 mL Oral TID BM   feeding supplement (OSMOLITE 1.5 CAL)  1,000 mL Per Tube Q24H   feeding supplement (PROSource TF20)  60 mL Per Tube BID   free water  200 mL Per Tube Q4H   Gerhardt's butt cream   Topical BID   insulin aspart  0-24 Units Subcutaneous Q4H   olopatadine  1 drop Both Eyes BID   pantoprazole (PROTONIX) IV  40 mg Intravenous Q12H   sodium chloride flush  10-40 mL Intravenous Q12H   sodium chloride flush  10-40 mL Intracatheter Q12H   sodium chloride flush  3 mL Intravenous Q12H   Continuous Infusions: PRN Meds:.acetaminophen (TYLENOL) oral liquid 160 mg/5 mL, hydrALAZINE, HYDROcodone-acetaminophen, levalbuterol, lidocaine (PF), morphine injection, ondansetron (ZOFRAN) IV, sodium chloride flush, sodium chloride flush, sodium chloride flush  Xrays No results found.  Assessment/Plan: S/P Procedure(s) (LRB): XI ROBOTIC ASSISTED IVOR LEWIS ESOPHAGECTOMY (N/A) XI ROBOTIC ASSISTED JEJUNOSTOMY TUBE PLACEMENT (N/A) ESOPHAGOGASTRODUODENOSCOPY (EGD) (N/A) NODE DISSECTION INTERCOSTAL NERVE BLOCK  POD #19 1 afebrile/stable vital signs, sinus rhythm with first-degree AV block 2 Oxygen saturations good on 1 L 3 excellent urine output 4 no new labs or x-rays 5 remains on oral Cipro, will need to determine duration, continues to have purulent drainage in the region of the J-tube insertion site 6 continues dysphagia diet,  tolerating-remains on nightly  J-tube feedings 7 awaiting DME approval/delivery from [place], hopefully home once these arrangements have been finalized and delivered.      LOS: 19 days    [name] PA-C Pager [phone] 04/24/2022    Agree with above Will stop tube feeds today, and assess PO intake Dispo planning  [place]

## 2022-04-25 ENCOUNTER — Inpatient Hospital Stay (HOSPITAL_COMMUNITY): Payer: No Typology Code available for payment source

## 2022-04-25 LAB — GLUCOSE, CAPILLARY
Glucose-Capillary: 107 mg/dL — ABNORMAL HIGH (ref 70–99)
Glucose-Capillary: 109 mg/dL — ABNORMAL HIGH (ref 70–99)
Glucose-Capillary: 116 mg/dL — ABNORMAL HIGH (ref 70–99)
Glucose-Capillary: 134 mg/dL — ABNORMAL HIGH (ref 70–99)
Glucose-Capillary: 145 mg/dL — ABNORMAL HIGH (ref 70–99)
Glucose-Capillary: 182 mg/dL — ABNORMAL HIGH (ref 70–99)

## 2022-04-25 MED ORDER — ZINC OXIDE 12.8 % EX OINT
TOPICAL_OINTMENT | Freq: Two times a day (BID) | CUTANEOUS | Status: DC
  Filled 2022-04-25 (×2): qty 56.7

## 2022-04-25 NOTE — Progress Notes (Addendum)
[address]       [place] [address]             [phone]      20 Days Post-Op Procedure(s) (LRB): XI ROBOTIC ASSISTED IVOR LEWIS ESOPHAGECTOMY (N/A) XI ROBOTIC ASSISTED JEJUNOSTOMY TUBE PLACEMENT (N/A) ESOPHAGOGASTRODUODENOSCOPY (EGD) (N/A) NODE DISSECTION INTERCOSTAL NERVE BLOCK Subjective: Some improvement in dietary intake but somewhat limited  Objective: Vital signs in last 24 hours: Temp:  [97.4 F (36.3 C)-98.9 F (37.2 C)] 97.4 F (36.3 C) (10/17 0753) Pulse Rate:  [86-106] 106 (10/17 0753) Cardiac Rhythm: Sinus tachycardia;Bundle branch block (10/17 0718) Resp:  [18-31] 18 (10/17 0753) BP: (109-145)/(70-102) 113/70 (10/17 0753) SpO2:  [90 %-93 %] 93 % (10/17 0753) Weight:  [146.9 kg] 146.9 kg (10/17 0602)  Hemodynamic parameters for last 24 hours:    Intake/Output from previous day: 10/16 0701 - 10/17 0700 In: 837 [P.O.:437; NG/GT:400] Out: 1850 [Urine:1850] Intake/Output this shift: Total I/O In: -  Out: 600 [Urine:600]  General appearance: alert, cooperative, and no distress Heart: regular rate and rhythm Lungs: clear to auscultation bilaterally Abdomen: Benign exam Extremities: Positive edema Wound: Incisions healing well without evidence of infection, G-tube site drainage has decreased  Lab Results: No results for input(s): "WBC", "HGB", "HCT", "PLT" in the last 72 hours. BMET: No results for input(s): "NA", "K", "CL", "CO2", "GLUCOSE", "BUN", "CREATININE", "CALCIUM" in the last 72 hours.  PT/INR: No results for input(s): "LABPROT", "INR" in the last 72 hours. ABG    Component Value Date/Time   PHART 7.41 04/06/2022 0332   HCO3 24.1 04/06/2022 0332   TCO2 28 11/13/2021 2122   ACIDBASEDEF 0.3 04/06/2022 0332   O2SAT 98 04/06/2022 0332   CBG (last 3)  Recent Labs    04/24/22 2316 04/25/22 0324 04/25/22 0805  GLUCAP 87 109* 182*    Meds Scheduled Meds:  Chlorhexidine Gluconate Cloth  6 each Topical Daily    ciprofloxacin  500 mg Oral BID   docusate  100 mg Per Tube BID   enoxaparin (LOVENOX) injection  75 mg Subcutaneous Q24H   feeding supplement  237 mL Oral TID BM   feeding supplement (PROSource TF20)  60 mL Per Tube BID   free water  200 mL Per Tube Q4H   Gerhardt's butt cream   Topical BID   insulin aspart  0-24 Units Subcutaneous Q4H   olopatadine  1 drop Both Eyes BID   pantoprazole (PROTONIX) IV  40 mg Intravenous Q12H   sodium chloride flush  10-40 mL Intravenous Q12H   sodium chloride flush  10-40 mL Intracatheter Q12H   sodium chloride flush  3 mL Intravenous Q12H   Continuous Infusions: PRN Meds:.acetaminophen (TYLENOL) oral liquid 160 mg/5 mL, hydrALAZINE, HYDROcodone-acetaminophen, levalbuterol, lidocaine (PF), morphine injection, ondansetron (ZOFRAN) IV, sodium chloride flush, sodium chloride flush, sodium chloride flush  Xrays No results found.  Assessment/Plan: S/P Procedure(s) (LRB): XI ROBOTIC ASSISTED IVOR LEWIS ESOPHAGECTOMY (N/A) XI ROBOTIC ASSISTED JEJUNOSTOMY TUBE PLACEMENT (N/A) ESOPHAGOGASTRODUODENOSCOPY (EGD) (N/A) NODE DISSECTION INTERCOSTAL NERVE BLOCK  POD #20  1 afebrile, vital signs stable, sinus with first-degree heart block 2 oxygen saturations good on room air 3 excellent urine output 4 calorie count in progress nighttime tube feeds are currently on hold 5 blood sugars adequate control 6 remains on Cipro will complete 1 week course, previous Zosyn IV 7 we will ask wound care nurse to give recommendations on J-tube site 8 continues therapies 9 awaiting DME delivery from [place] to home  LOS: 20 days    [name] PA-C  Pager [phone] 04/25/2022    Tolerating diet Will likely remove J tube tomorrow if calorie counts acceptable IS, ambulation Medically ready for discharge  Dayyan Krist O [place]

## 2022-04-25 NOTE — TOC Progression Note (Signed)
Transition of Care [facility]) - Progression Note    Patient Details  Name: Keith Moreno MRN: [id number] Date of Birth: Jul 31, 1960  [facility]) CM/SW Contact  Zenon Mayo, RN Phone Number: 04/25/2022, 4:04 PM  Clinical Narrative:    Calorie count in progress , tube fees on hold.  Awaiting DME from [place] to be delivered to home, wife states she has not received it yet.  NCM spoke with [facility] rep, she states it is still going to take up to 5 days from the day it was ordered which was 10/13.  TOC following.         Expected Discharge Plan and Services                                                 Social Determinants of Health (SDOH) Interventions    Readmission Risk Interventions     No data to display

## 2022-04-25 NOTE — Progress Notes (Signed)
Calorie Count Note: Day 1 Results  48-hour calorie count ordered. Calorie count started yesterday, 04/24/22, at lunch meal. Please see day 1 results below.  Diet: dysphagia 3, thin liquids Supplements: - Ensure Enlive TID  Day 1: 10/16 Lunch: 428 kcal, 34 grams of protein 10/16 Dinner: 577 kcal, 37 grams of protein 10/17 Breakfast: 326 kcal, 18 grams of protein Supplements: 700 kcal, 40 grams of protein (2 Ensure)  Day 1 total 24-hour intake: 2031 kcal (81% of minimum estimated needs)  129 grams protein (100% of minimum estimated needs)  Nutrition Diagnosis: Moderate Malnutrition related to chronic illness (esophageal cancer) as evidenced by mild muscle depletion, percent weight loss (9.2% weight loss in 5 months).  Goal: Patient will meet greater than or equal to 90% of their needs.  Intervention:  - Continue 48-hour calorie count - Continue Ensure Enlive TID   Gustavus Bryant, MS, RD, LDN Inpatient Clinical Dietitian Please see AMiON for contact information.

## 2022-04-25 NOTE — Consult Note (Signed)
WOC Nurse Consult Note: Patient receiving care in [facility] 2C14.  Reason for Consult:Jejunostomy tube site care Wound type: Contact dermatitis from leakage from the J tube as well as tissue impairment from the protrusions on the bumper.  It would be very helpful if the sutures on the bumper could be removed. Pressure Injury POA: Yes/No/NA Measurement: na Wound bed: red, partial thickness impairment Drainage (amount, consistency, odor) yellow, no odor Periwound: erythematous Dressing procedure/placement/frequency: Wash under the bumper of the J tube with soap and water, or no rinse spray. Dry the area as thoroughly as possible. Place Triple Paste on the reddened skin. Place a Drawtex dressing Kellie Simmering [id number]) as best you can under the bumper. Cover with ABD pad, tape in place.  Monitor the wound area(s) for worsening of condition such as: Signs/symptoms of infection,  Increase in size,  Development of or worsening of odor, Development of pain, or increased pain at the affected locations.  Notify the medical team if any of these develop.  Thank you for the consult.  Discussed plan of care with the patient and bedside nurse.  [place] nurse will not follow at this time.  Please re-consult the [facility] team if needed.  Val Riles, RN, MSN, CWOCN, CNS-BC, pager [phone]

## 2022-04-25 NOTE — Plan of Care (Signed)

## 2022-04-25 NOTE — Progress Notes (Signed)
Occupational Therapy Treatment Patient Details Name: Keith Moreno MRN: [id number] DOB: 06-Jan-1961 Today's Date: 04/25/2022   History of present illness 61 yo male admitted 9/27 for esophagectomy with J tube placement due to esophageal CA. PMhx: chronic low back pain, esophageal CA   OT comments  Keith Moreno is making great progress. He required generalized supervision for all mobility and transfers with RW. Pt completed toileting with max A for hygiene due to pain with reaching/twisting, and grooming in standing with generalized supervision. He remains limited by activity tolerance, pain and decreased activity tolerance. OT to continue to follow acutely. D/c recommendation remains appropriate.    Recommendations for follow up therapy are one component of a multi-disciplinary discharge planning process, led by the attending physician.  Recommendations may be updated based on patient status, additional functional criteria and insurance authorization.    Follow Up Recommendations  Home health OT    Assistance Recommended at Discharge Intermittent Supervision/Assistance  Patient can return home with the following  A little help with walking and/or transfers;A little help with bathing/dressing/bathroom;Assistance with cooking/housework;Assist for transportation;Help with stairs or ramp for entrance   Equipment Recommendations  Other (comment)       Precautions / Restrictions Precautions Precautions: Fall;Other (comment) Precaution Comments: JP drain rt flank, PEG Restrictions Weight Bearing Restrictions: No       Mobility Bed Mobility               General bed mobility comments: OOB in chair upon arrival    Transfers Overall transfer level: Needs assistance Equipment used: Rolling walker (2 wheels) Transfers: Sit to/from Stand Sit to Stand: Supervision                 Balance Overall balance assessment: Needs assistance Sitting-balance support: Feet  supported Sitting balance-Leahy Scale: Good     Standing balance support: Bilateral upper extremity supported, Reliant on assistive device for balance Standing balance-Leahy Scale: Fair Standing balance comment: can static stand without UE support                           ADL either performed or assessed with clinical judgement   ADL Overall ADL's : Needs assistance/impaired Eating/Feeding: Independent;Sitting   Grooming: Supervision/safety;Standing Grooming Details (indicate cue type and reason): at the sink     Lower Body Bathing: Min guard;Sit to/from stand Lower Body Bathing Details (indicate cue type and reason): standing at the toilet         Toilet Transfer: Supervision/safety;Rolling walker (2 wheels) Toilet Transfer Details (indicate cue type and reason): distant supervision a Toileting- Clothing Manipulation and Hygiene: Maximal assistance;Sit to/from stand Toileting - Clothing Manipulation Details (indicate cue type and reason): unable to reach rear peri due to pain     Functional mobility during ADLs: Supervision/safety;Rolling walker (2 wheels) General ADL Comments: Flat affect throughout but overall WFL for cog tasks. good safety awareness. verbalized 1 energy conservation strategy    Extremity/Trunk Assessment Upper Extremity Assessment Upper Extremity Assessment: Generalized weakness   Lower Extremity Assessment Lower Extremity Assessment: Defer to PT evaluation        Vision   Vision Assessment?: No apparent visual deficits   Perception Perception Perception: Not tested   Praxis Praxis Praxis: Not tested    Cognition Arousal/Alertness: Awake/alert Behavior During Therapy: Flat affect Overall Cognitive Status: Within Functional Limits for tasks assessed  General Comments VSS on RA    Pertinent Vitals/ Pain       Pain Assessment Pain Assessment: Faces Faces Pain Scale: Hurts a little bit Pain  Location: generalized wtih movement Pain Descriptors / Indicators: Aching Pain Intervention(s): Limited activity within patient's tolerance, Monitored during session   Frequency  Min 2X/week        Progress Toward Goals  OT Goals(current goals can now be found in the care plan section)  Progress towards OT goals: Progressing toward goals  Acute Rehab OT Goals Patient Stated Goal: to get better OT Goal Formulation: With patient Potential to Achieve Goals: Good ADL Goals Pt Will Perform Grooming: with modified independence;standing Pt Will Perform Lower Body Bathing: with modified independence;sit to/from stand Pt Will Perform Lower Body Dressing: with modified independence;sit to/from stand Pt Will Transfer to Toilet: with modified independence;ambulating Additional ADL Goal #1: pt will indep recall at least 2 energy conservation strategies to apply in the home setting  Plan Discharge plan remains appropriate       AM-PAC OT "6 Clicks" Daily Activity     Outcome Measure   Help from another person eating meals?: None Help from another person taking care of personal grooming?: A Little Help from another person toileting, which includes using toliet, bedpan, or urinal?: A Little Help from another person bathing (including washing, rinsing, drying)?: A Little Help from another person to put on and taking off regular upper body clothing?: A Little Help from another person to put on and taking off regular lower body clothing?: A Little 6 Click Score: 19    End of Session Equipment Utilized During Treatment: Rolling walker (2 wheels)  OT Visit Diagnosis: Unsteadiness on feet (R26.81);Other abnormalities of gait and mobility (R26.89);Muscle weakness (generalized) (M62.81);Pain   Activity Tolerance Patient tolerated treatment well   Patient Left in chair;with call bell/phone within reach (eating lunch)           Time: 6269-4854 OT Time Calculation (min): 20  min  Charges: OT General Charges $OT Visit: 1 Visit OT Treatments $Self Care/Home Management : 8-22 mins    Elliot Cousin 04/25/2022, 12:20 PM

## 2022-04-25 NOTE — Progress Notes (Signed)
  Mobility Specialist Progress Note    04/25/22 1347  Mobility  Activity Refused mobility   Patient still has migraine and has generalized pain with any movement. Will f/u as time permits.    [place] Glada Wickstrom, [place], [place]  Office: [phone]

## 2022-04-26 LAB — GLUCOSE, CAPILLARY
Glucose-Capillary: 105 mg/dL — ABNORMAL HIGH (ref 70–99)
Glucose-Capillary: 106 mg/dL — ABNORMAL HIGH (ref 70–99)
Glucose-Capillary: 110 mg/dL — ABNORMAL HIGH (ref 70–99)
Glucose-Capillary: 125 mg/dL — ABNORMAL HIGH (ref 70–99)
Glucose-Capillary: 128 mg/dL — ABNORMAL HIGH (ref 70–99)
Glucose-Capillary: 176 mg/dL — ABNORMAL HIGH (ref 70–99)

## 2022-04-26 MED ORDER — PANTOPRAZOLE SODIUM 40 MG PO TBEC
40.0000 mg | DELAYED_RELEASE_TABLET | Freq: Two times a day (BID) | ORAL | Status: DC
  Administered 2022-04-26 – 2022-04-28 (×4): 40 mg via ORAL
  Filled 2022-04-26 (×4): qty 1

## 2022-04-26 MED ORDER — PANTOPRAZOLE 2 MG/ML SUSPENSION
40.0000 mg | Freq: Two times a day (BID) | ORAL | Status: DC
  Filled 2022-04-26: qty 20

## 2022-04-26 NOTE — Progress Notes (Signed)
PT Cancellation Note  Patient Details Name: Delroy Ordway MRN: [id number] DOB: 02/11/1961   Cancelled Treatment:    Reason Eval/Treat Not Completed: Pain limiting ability to participate. Pt declining PT, reports having a migraine. PT to re-attempt as time allows.   Lorriane Shire 04/26/2022, 9:59 AM

## 2022-04-26 NOTE — Progress Notes (Addendum)
[address]       [place] [address]             [phone]      21 Days Post-Op Procedure(s) (LRB): XI ROBOTIC ASSISTED IVOR LEWIS ESOPHAGECTOMY (N/A) XI ROBOTIC ASSISTED JEJUNOSTOMY TUBE PLACEMENT (N/A) ESOPHAGOGASTRODUODENOSCOPY (EGD) (N/A) NODE DISSECTION INTERCOSTAL NERVE BLOCK Subjective: Has a migraine  Objective: Vital signs in last 24 hours: Temp:  [97.4 F (36.3 C)-98.9 F (37.2 C)] 98.3 F (36.8 C) (10/18 0345) Pulse Rate:  [87-106] 95 (10/18 0655) Cardiac Rhythm: Normal sinus rhythm (10/18 0345) Resp:  [18-31] 20 (10/18 0655) BP: (113-141)/(59-84) 124/66 (10/18 0345) SpO2:  [93 %-96 %] 95 % (10/18 0655) Weight:  [144.5 kg] 144.5 kg (10/18 0425)  Hemodynamic parameters for last 24 hours:    Intake/Output from previous day: 10/17 0701 - 10/18 0700 In: 20 [I.V.:20] Out: 2850 [Urine:2850] Intake/Output this shift: No intake/output data recorded.  General appearance: alert, cooperative, fatigued, and no distress Heart: regular rate and rhythm Lungs: clear to auscultation bilaterally Abdomen: Benign exam Extremities: Very minor edema Wound: Healing well without evidence of infection, J-tube site drainage has continued to decrease  Lab Results: No results for input(s): "WBC", "HGB", "HCT", "PLT" in the last 72 hours. BMET: No results for input(s): "NA", "K", "CL", "CO2", "GLUCOSE", "BUN", "CREATININE", "CALCIUM" in the last 72 hours.  PT/INR: No results for input(s): "LABPROT", "INR" in the last 72 hours. ABG    Component Value Date/Time   PHART 7.41 04/06/2022 0332   HCO3 24.1 04/06/2022 0332   TCO2 28 11/13/2021 2122   ACIDBASEDEF 0.3 04/06/2022 0332   O2SAT 98 04/06/2022 0332   CBG (last 3)  Recent Labs    04/25/22 1944 04/25/22 2338 04/26/22 0342  GLUCAP 145* 116* 125*    Meds Scheduled Meds:  Chlorhexidine Gluconate Cloth  6 each Topical Daily   ciprofloxacin  500 mg Oral BID   docusate  100 mg Per Tube BID    enoxaparin (LOVENOX) injection  75 mg Subcutaneous Q24H   feeding supplement  237 mL Oral TID BM   feeding supplement (PROSource TF20)  60 mL Per Tube BID   free water  200 mL Per Tube Q4H   Gerhardt's butt cream   Topical BID   insulin aspart  0-24 Units Subcutaneous Q4H   olopatadine  1 drop Both Eyes BID   pantoprazole (PROTONIX) IV  40 mg Intravenous Q12H   sodium chloride flush  10-40 mL Intravenous Q12H   sodium chloride flush  10-40 mL Intracatheter Q12H   sodium chloride flush  3 mL Intravenous Q12H   Zinc Oxide   Topical BID   Continuous Infusions: PRN Meds:.acetaminophen (TYLENOL) oral liquid 160 mg/5 mL, hydrALAZINE, HYDROcodone-acetaminophen, levalbuterol, lidocaine (PF), morphine injection, ondansetron (ZOFRAN) IV, sodium chloride flush, sodium chloride flush, sodium chloride flush  Xrays DG Chest Port 1 View  Result Date: 04/25/2022 CLINICAL DATA:  Pleural effusion, shortness of breath EXAM: PORTABLE CHEST 1 VIEW COMPARISON:  04/20/2022 FINDINGS: Chest port catheter tip overlies the distal superior vena cava. Unchanged cardiomediastinal silhouette. Postsurgical changes of Ivor Lewis esophagectomy with gastric pull-through. Stable small left pleural effusion with increased adjacent atelectasis. No evidence of pneumothorax. IMPRESSION: Postsurgical changes of esophagectomy. Stable small left pleural effusion with increased adjacent atelectasis. Electronically Signed   By: Maurine Simmering M.D.   On: 04/25/2022 10:56    Assessment/Plan: S/P Procedure(s) (LRB): XI ROBOTIC ASSISTED IVOR LEWIS ESOPHAGECTOMY (N/A) XI ROBOTIC ASSISTED JEJUNOSTOMY TUBE  PLACEMENT (N/A) ESOPHAGOGASTRODUODENOSCOPY (EGD) (N/A) NODE DISSECTION INTERCOSTAL NERVE BLOCK POD#21  1 Afeb, VSS, sinus rhythm with first-degree AV block 2 oxygen saturations good on 2 L, had to be replaced for lower sats, was off O2 previously-chest x-ray from yesterday shows small left effusion with some atelectasis, hopefully will  remain so and not require additional thoracentesis.  We will repeat x-ray in the morning including lateral 3 excellent urine output 4 no new labs or x-rays 5 calorie count in progress 6 TOC assisting with home needs, [facility] with stated  5-day until started on 10/13 7 continues therapies 8 Home soon when arrangements finalized [name] after tomorrow      LOS: 21 days    Keith Moreno 04/26/2022    Feeding tube removed Will plan on SNF placement  [place]

## 2022-04-26 NOTE — Progress Notes (Signed)
Mobility Specialist Progress Note    04/26/22 1139  Mobility  Activity Ambulated with assistance in hallway  Level of Assistance Standby assist, set-up cues, supervision of patient - no hands on  Assistive Device Four wheel walker  Distance Ambulated (ft) 420 ft  Activity Response Tolerated well  Mobility Referral Yes  $Mobility charge 1 Mobility   Pre-Mobility: 91 HR, 94% SpO2 During Mobility: 123 HR, 96% SpO2 on RA Post-Mobility: 104 HR, 94% SpO2 on RA  Pt received in bed and agreeable. No complaints on walk. Encouraged pursed lip breathing. Tolerated activity on RA. Returned to chair with call bell in reach. Left on RA.   Hildred Alamin Mobility Specialist  Secure Chat Only

## 2022-04-26 NOTE — TOC Progression Note (Addendum)
Transition of Care [facility]) - Progression Note    Patient Details  Name: Keith Moreno MRN: [id number] Date of Birth: [birth year]  [facility]) CM/SW Contact  Zenon Mayo, RN Phone Number: 04/26/2022, 1:49 PM  Clinical Narrative:    NCM spoke with patient and wife at the bedside, wife states their son took their bed down in preparation for the hospital bed, so now the son has to come back and put the bed up again since they do  not need the hospital bed, and he will not be able to come by to this until Maybe Thursday or Friday, wife states they do not have a bed up yet to sleep on, so if possible wondering if they could be dc on Friday morning in order to give her son time enough to put their bed back together for them.  This NCM stated will let MD know of this information and Staff RN Mickel Baas.  Mickel Baas states she will make sure MD gets this information. Tube feedings have been discontinued so he no longer needs the hospital bed.  NCM left message for [name] [phone] ext [phone] at the [facility] for a return call to let her know that patient no longer needs hospital bed or tube feeds.   15:47 NCM received call back from [facility] at [facility], informed her that patient no longer needs tube feeds or hospital bed, but they do want to keep the shower chair and the rollator, and they are still set up with [facility] for [place], [place], [place].         Expected Discharge Plan and Services                                                 Social Determinants of Health (SDOH) Interventions    Readmission Risk Interventions     No data to display

## 2022-04-26 NOTE — Progress Notes (Signed)
Calorie Count Note: Day 2 Results  48-hour calorie count ordered. Calorie count started on 04/24/22 at lunch meal. Please see day 2 results below.  Diet: dysphagia 3, thin liquids Supplements: - Ensure Enlive TID  Day 2: 10/17 Lunch: 256 kcal, 5 grams of protein 10/17 Dinner: 120 kcal, 0 grams of protein 10/18 Breakfast: 300 kcal, 16 grams of protein Supplements: 530 kcal, 22 grams of protein (1 Boost VHC from home)  Day 2 total 24-hour intake: 1206 kcal (48% of minimum estimated needs)  43 grams protein (36% of minimum estimated needs)  Nutrition Diagnosis: Moderate Malnutrition related to chronic illness (esophageal cancer) as evidenced by mild muscle depletion, percent weight loss (9.2% weight loss in 5 months).  Goal: Patient will meet greater than or equal to 90% of their needs.  Intervention: - d/c 48-hour calorie count - J-tube has been removed - Continue Ensure Enlive TID - Pt to consume Boost Very High Calorie oral nutrition supplements from home - Diet education for discharge provided, including information on calorie and protein goals   Gustavus Bryant, MS, RD, LDN Inpatient Clinical Dietitian Please see AMiON for contact information.

## 2022-04-26 NOTE — TOC Progression Note (Signed)
Transition of Care [facility]) - Progression Note    Patient Details  Name: Keith Moreno MRN: [id number] Date of Birth: [birth year]  Transition of Care [facility]) CM/SW [place], [place] Phone Number: 04/26/2022, 11:57 AM  Clinical Narrative:    CSW spoke with pt and wife in room after being informed that the MD wants pt to go to SNF. Pt and wife declined SNF and stated they want to take pt home and the wife cares for him around the clock. Pt no longer needs the hospital bed delivered to his home but asks if he can DC Friday bc they need to get his room set back up again bc everything was moved out when he originally needed the hospital bed delivered.         Expected Discharge Plan and Services                                                 Social Determinants of Health (SDOH) Interventions    Readmission Risk Interventions     No data to display

## 2022-04-27 ENCOUNTER — Inpatient Hospital Stay (HOSPITAL_COMMUNITY): Payer: No Typology Code available for payment source

## 2022-04-27 ENCOUNTER — Ambulatory Visit: Payer: No Typology Code available for payment source | Admitting: Thoracic Surgery (Cardiothoracic Vascular Surgery)

## 2022-04-27 LAB — GLUCOSE, CAPILLARY
Glucose-Capillary: 113 mg/dL — ABNORMAL HIGH (ref 70–99)
Glucose-Capillary: 121 mg/dL — ABNORMAL HIGH (ref 70–99)
Glucose-Capillary: 124 mg/dL — ABNORMAL HIGH (ref 70–99)
Glucose-Capillary: 127 mg/dL — ABNORMAL HIGH (ref 70–99)
Glucose-Capillary: 131 mg/dL — ABNORMAL HIGH (ref 70–99)
Glucose-Capillary: 192 mg/dL — ABNORMAL HIGH (ref 70–99)

## 2022-04-27 MED ORDER — DOCUSATE SODIUM 100 MG PO CAPS
100.0000 mg | ORAL_CAPSULE | Freq: Two times a day (BID) | ORAL | Status: DC
  Administered 2022-04-27 – 2022-04-28 (×2): 100 mg via ORAL
  Filled 2022-04-27: qty 1

## 2022-04-27 MED ORDER — HYDROCODONE-ACETAMINOPHEN 7.5-325 MG/15ML PO SOLN: 10.0000 mL | Freq: Four times a day (QID) | ORAL | Status: DC | PRN

## 2022-04-27 MED ORDER — ACETAMINOPHEN 325 MG PO TABS
650.0000 mg | ORAL_TABLET | Freq: Four times a day (QID) | ORAL | Status: DC | PRN
  Administered 2022-04-28: 650 mg via ORAL
  Filled 2022-04-27: qty 2

## 2022-04-27 NOTE — Progress Notes (Signed)
[address]       [place] [address]             [phone]      22 Days Post-Op Procedure(s) (LRB): XI ROBOTIC ASSISTED IVOR LEWIS ESOPHAGECTOMY (N/A) XI ROBOTIC ASSISTED JEJUNOSTOMY TUBE PLACEMENT (N/A) ESOPHAGOGASTRODUODENOSCOPY (EGD) (N/A) NODE DISSECTION INTERCOSTAL NERVE BLOCK Subjective: Feels well  Objective: Vital signs in last 24 hours: Temp:  [98.3 F (36.8 C)-98.9 F (37.2 C)] 98.5 F (36.9 C) (10/19 0430) Pulse Rate:  [86-109] 98 (10/19 0430) Cardiac Rhythm: Sinus tachycardia;Bundle branch block (10/18 1931) Resp:  [15-23] 15 (10/19 0430) BP: (101-152)/(70-86) 101/70 (10/19 0430) SpO2:  [92 %-96 %] 94 % (10/19 0430) Weight:  [145 kg] 145 kg (10/19 0606)  Hemodynamic parameters for last 24 hours:    Intake/Output from previous day: 10/18 0701 - 10/19 0700 In: 10 [I.V.:10] Out: 1500 [Urine:1500] Intake/Output this shift: No intake/output data recorded.  General appearance: alert, cooperative, and no distress Heart: regular rate and rhythm Lungs: clear Abdomen: benign Extremities: minor edema Wound: incis healing well, maceration at former J tube site, some purulence persists   Lab Results: No results for input(s): "WBC", "HGB", "HCT", "PLT" in the last 72 hours. BMET: No results for input(s): "NA", "K", "CL", "CO2", "GLUCOSE", "BUN", "CREATININE", "CALCIUM" in the last 72 hours.  PT/INR: No results for input(s): "LABPROT", "INR" in the last 72 hours. ABG    Component Value Date/Time   PHART 7.41 04/06/2022 0332   HCO3 24.1 04/06/2022 0332   TCO2 28 11/13/2021 2122   ACIDBASEDEF 0.3 04/06/2022 0332   O2SAT 98 04/06/2022 0332   CBG (last 3)  Recent Labs    04/26/22 1940 04/26/22 2352 04/27/22 0426  GLUCAP 176* 106* 127*    Meds Scheduled Meds:  Chlorhexidine Gluconate Cloth  6 each Topical Daily   ciprofloxacin  500 mg Oral BID   docusate  100 mg Per Tube BID   enoxaparin (LOVENOX) injection  75 mg Subcutaneous  Q24H   feeding supplement  237 mL Oral TID BM   feeding supplement (PROSource TF20)  60 mL Per Tube BID   free water  200 mL Per Tube Q4H   Gerhardt's butt cream   Topical BID   insulin aspart  0-24 Units Subcutaneous Q4H   olopatadine  1 drop Both Eyes BID   pantoprazole  40 mg Oral BID   sodium chloride flush  10-40 mL Intravenous Q12H   sodium chloride flush  10-40 mL Intracatheter Q12H   sodium chloride flush  3 mL Intravenous Q12H   Zinc Oxide   Topical BID   Continuous Infusions: PRN Meds:.acetaminophen (TYLENOL) oral liquid 160 mg/5 mL, hydrALAZINE, HYDROcodone-acetaminophen, levalbuterol, lidocaine (PF), morphine injection, ondansetron (ZOFRAN) IV, sodium chloride flush, sodium chloride flush, sodium chloride flush  Xrays DG Chest Port 1 View  Result Date: 04/25/2022 CLINICAL DATA:  Pleural effusion, shortness of breath EXAM: PORTABLE CHEST 1 VIEW COMPARISON:  04/20/2022 FINDINGS: Chest port catheter tip overlies the distal superior vena cava. Unchanged cardiomediastinal silhouette. Postsurgical changes of Ivor Lewis esophagectomy with gastric pull-through. Stable small left pleural effusion with increased adjacent atelectasis. No evidence of pneumothorax. IMPRESSION: Postsurgical changes of esophagectomy. Stable small left pleural effusion with increased adjacent atelectasis. Electronically Signed   By: Maurine Simmering M.D.   On: 04/25/2022 10:56      FINAL MICROSCOPIC DIAGNOSIS:   A. LYMPH NODE, LEVEL 7, EXCISION:  -  1 lymph node with fibrosis/scar and abundant noncaseating  granulomas.  -  Negative for viable tumor/malignancy on sections examined.   B. 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 near complete  response.  -  21 lymph nodes, negative for malignancy (0/21).  ypT2, ypN0, see note and synoptic  report below.   Note: The clinical history of a moderately to poorly differentiated  adenocarcinoma; HER2/neu negative ([id number]) is noted.  The  excisional specimen has a "lesion" that measures 4.6 x 3.6 cm; however,  the vast majority of this lesion consists of predominantly mucosal and  submucosal fibrosis with only focal areas of single cells/rare groups of  cells consistent with residual adenocarcinoma and a tumor regression  score of 1 of 3, i.e. near complete response.  In addition the vast  majority of the lymph nodes show fibrosis/scarlike areas with associated  granulomatous response consistent with complete treatment response.  There is no residual adenocarcinoma identified in the lymph nodes.   ESOPHAGUS, CARCINOMA: Resection   Procedure: Esophagogastrectomy  Tumor Site: Distal esophagus  Relationship of Tumor to Esophagogastric Junction: Just proximal to the  GE junction  Distance of Tumor Center from Esophagogastric Junction: See above  Tumor Size: 4.6 x 3.6 cm  Histologic Type: Adenocarcinoma  Histologic Grade: Poorly differentiated; however this is after  chemoradiation therapy.  Tumor Extension: Focally present in muscularis propria.  Treatment Effect: Tumor regression score (modified Ryan scheme) 1 of 3  (single cells/rare groups of cancer cells (near complete response)  Lymphovascular Invasion: Not identified  Margins:       Margin Status for Invasive Carcinoma: All margins negative for  invasive carcinoma       Margin Status for Dysplasia and Intestinal Metaplasia: All margins  negative for dysplasia and intestinal metaplasia.  Regional Lymph Nodes:       Number of Lymph Nodes with Tumor: 0 (the majority of the lymph  nodes have fibrosis/scarlike tissue and noncaseating granulomatous  inflammation suggestive of complete response.       Number of Lymph Nodes Examined: 22  Distant Metastasis:       Distant Site(s) Involved: N/A  Pathologic Stage  Classification (pTNM, AJCC 8th Edition): ypT2, ypN0  Ancillary Studies: Can be performed at clinician's request  Representative Tumor Block: B6/B7  Comment(s): [None]   (v4.2.0.0)    GROSS DESCRIPTION:   A: The specimen is received fresh and consists of 2 tan-white possible  lymph nodes, measuring 0.5 cm and 1.5 x 1.2 x 0.6 cm.  Bisecting the  largest possible lymph node reveals a tan-gray solid cut surface.  The  specimen is entirely submitted in 2 cassettes.   B: The specimen is received fresh labeled esophagogastrectomy, and  consists of a portion of esophagus and stomach with stapled resection  margins.  The esophagus measures approximately 8.5 cm, and upon opening  the stomach measures approximately 17.5 x 12.0 cm.  The serosal surface  is tan-pink and hyperemic, with attached tan-yellow adipose tissue.  The  outer surface of the esophagus is inked black, and opening reveals a 4.6  x 3.6 cm tan-white, firm, ill-defined area identified.  Please note the  patient is status post neoadjuvant therapy.  The gastroesophageal  junction is slightly irregular and disrupted, with the possible lesion  lying just proximal to the junction.  The lumen of the stomach is filled  with a small amount of tan-green gelatinous material.  The mucosa is  tan-pink with mild flattening of the mucosal folds.  The possible lesion  measures approximately 2.3 cm from the proximal margin and 12.7 cm to  the closest distal margin.  Sectioning through the possible lesion  reveals a tan, fibrotic cut surface.  No distinct invasion is grossly  identified.  Identified within the surrounding adipose tissue are 22  tan-pink possible lymph nodes, ranging from 0.2 cm to 2.6 x 1.2 x 0.9  cm.  The largest 3 possible lymph nodes display tan-white, indurated cut  surfaces.  Representative sections are submitted in 40 cassettes.  1 = esophagus margin  2 and 3 = closest stomach margin  4-30 = entire possible lesion,  sequentially submitted to include GEJ  31 = grossly uninvolved esophagus  32 = stomach  33-36 = 4 possible lymph nodes, each  37 = 3 possible lymph nodes  38 = 1 possible lymph node, bisected  39 and 40 = representative sections from 2 largest possible lymph nodes  Craig Staggers 04/06/2022)     Final Diagnosis performed by Tilford Pillar DO.   Electronically signed  04/07/2022  Technical component performed at [employer]. [facility], [place] [address] [place], [place] [address].   Professional component performed at [facility],  [place] [address], [place], [place] [address].   Immunohistochemistry Technical component (if applicable) was performed  at [facility]. [address] [place],  [place], [place] [address].   IMMUNOHISTOCHEMISTRY DISCLAIMER (if applicable):  Some of these immunohistochemical stains may have been developed and the  performance characteristics determine by [facility]. Some  may not have been cleared or approved by the U.S. Food and Drug  Administration. The FDA has determined that such clearance or approval  is not necessary. This test is used for clinical purposes. It should not  be regarded as investigational or for research. This laboratory is  certified under the [place]  (CLIA-88) as qualified to perform high complexity clinical laboratory  testing.  The controls stained appropriately   Assessment/Plan: S/P Procedure(s) (LRB): XI ROBOTIC ASSISTED IVOR LEWIS ESOPHAGECTOMY (N/A) XI ROBOTIC ASSISTED JEJUNOSTOMY TUBE PLACEMENT (N/A) ESOPHAGOGASTRODUODENOSCOPY (EGD) (N/A) NODE DISSECTION INTERCOSTAL NERVE BLOCK Postop day #22  1 afebrile, systolic blood pressure 993T to 150s, sinus rhythm/sinus tach, with first-degree AV block 2 oxygen saturations good on room air 3 good urine output 4 J-tube has been removed, nutritionist continues to educate patient on dietary  needs 5 chest x-ray-left effusion does not appear to have reaccumulated in any substantial manner. 6 no new blood tests/labs 7 final path noted above 8 home equipment appears to be in place including shower chair and rollator.  Other arrangements for home nursing physical therapy and Occupational Therapy have been made.  Patient's family requests 1 more day to prepare home prior to discharge 9 d/c cipro  LOS: 22 days    Keith Moreno 04/27/2022

## 2022-04-27 NOTE — Progress Notes (Signed)
Mobility Specialist Progress Note    04/27/22 0948  Mobility  Activity Ambulated with assistance in hallway  Level of Assistance Standby assist, set-up cues, supervision of patient - no hands on  Assistive Device Four wheel walker  Distance Ambulated (ft) 420 ft  Activity Response Tolerated well  Mobility Referral Yes  $Mobility charge 1 Mobility   Pre-Mobility: 84 HR, 96% SpO2 During Mobility: 113 HR, 94% SpO2 Post-Mobility: 104 HR, 98% SpO2  Pt received in chair and agreeable. C/o some fatigue, took x1 2 minute seated rest break. Returned to chair with call bell in reach. Tolerated on RA.   Hildred Alamin Mobility Specialist  Secure Chat Only

## 2022-04-27 NOTE — Progress Notes (Signed)
Physical Therapy Treatment Patient Details Name: Keith Moreno MRN: [id number] DOB: 06/13/1961 Today's Date: 04/27/2022   History of Present Illness 61 yo male admitted 9/27 for esophagectomy with J tube placement due to esophageal CA. PMhx: chronic low back pain, esophageal CA    PT Comments    Pt with continued progress this session with focus on gait and stair training for increased activity tolerance. Pt demonstrating gait at supervision level with rollator needing x1 seated recovery break. Pt able to ascend /descend 3 steps in stairwell without fault and continue gait without need for seated recovery. VSS throughout session on RA. Current plan remains appropriate to address deficits and maximize functional independence and safety. Pt continues to benefit from skilled PT services to progress toward functional mobility goals.    Recommendations for follow up therapy are one component of a multi-disciplinary discharge planning process, led by the attending physician.  Recommendations may be updated based on patient status, additional functional criteria and insurance authorization.  Follow Up Recommendations  Home health PT     Assistance Recommended at Discharge Intermittent Supervision/Assistance  Patient can return home with the following A little help with walking and/or transfers;A little help with bathing/dressing/bathroom;Assistance with cooking/housework;Assist for transportation;Help with stairs or ramp for entrance   Equipment Recommendations  Rollator (4 wheels)    Recommendations for Other Services       Precautions / Restrictions Precautions Precautions: Fall;Other (comment) Precaution Comments: JP drain rt flank Restrictions Weight Bearing Restrictions: No     Mobility  Bed Mobility Overal bed mobility: Needs Assistance             General bed mobility comments: OOB in chair upon arrival    Transfers Overall transfer level: Needs  assistance Equipment used: Rolling walker (2 wheels), None, Rollator (4 wheels) Transfers: Sit to/from Stand Sit to Stand: Supervision           General transfer comment: supervision throughout session    Ambulation/Gait Ambulation/Gait assistance: Supervision Gait Distance (Feet): 440 Feet Assistive device: Rollator (4 wheels), None Gait Pattern/deviations: Step-through pattern, Decreased stride length Gait velocity: decr     General Gait Details: x1 seated rest, pt able to take steps in room without AD   Stairs   Stairs assistance: Min guard Stair Management: One rail Right, Sideways, Step to pattern Number of Stairs: 3 General stair comments: no LOB, pt turning sideways to have bil hands on rail with good return   Wheelchair Mobility    Modified Rankin (Stroke Patients Only)       Balance Overall balance assessment: Needs assistance Sitting-balance support: Feet supported Sitting balance-Leahy Scale: Good     Standing balance support: Bilateral upper extremity supported, Reliant on assistive device for balance Standing balance-Leahy Scale: Fair Standing balance comment: can static stand without UE support and take steps                            Cognition Arousal/Alertness: Awake/alert Behavior During Therapy: WFL for tasks assessed/performed Overall Cognitive Status: Within Functional Limits for tasks assessed                                          Exercises      General Comments General comments (skin integrity, edema, etc.): VSS on RA      Pertinent Vitals/Pain Pain Assessment Pain Assessment: No/denies  pain    Home Living                          Prior Function            PT Goals (current goals can now be found in the care plan section) Acute Rehab PT Goals Patient Stated Goal: return home PT Goal Formulation: With patient Time For Goal Achievement: 04/25/22    Frequency    Min  3X/week      PT Plan Current plan remains appropriate    Co-evaluation              AM-PAC PT "6 Clicks" Mobility   Outcome Measure  Help needed turning from your back to your side while in a flat bed without using bedrails?: A Little Help needed moving from lying on your back to sitting on the side of a flat bed without using bedrails?: A Little Help needed moving to and from a bed to a chair (including a wheelchair)?: A Little Help needed standing up from a chair using your arms (e.g., wheelchair or bedside chair)?: A Little Help needed to walk in hospital room?: A Little Help needed climbing 3-5 steps with a railing? : A Little 6 Click Score: 18    End of Session   Activity Tolerance: Patient tolerated treatment well Patient left: in chair;with call bell/phone within reach;with family/visitor present Nurse Communication: Mobility status PT Visit Diagnosis: Other abnormalities of gait and mobility (R26.89);Difficulty in walking, not elsewhere classified (R26.2);Muscle weakness (generalized) (M62.81)     Time: 1610-9604 PT Time Calculation (min) (ACUTE ONLY): 25 min  Charges:  $Gait Training: 8-22 mins $Therapeutic Activity: 8-22 mins                     [name] R. PTA Acute Rehabilitation Services Office: [phone]    Catalina Antigua 04/27/2022, 2:59 PM

## 2022-04-28 LAB — GLUCOSE, CAPILLARY
Glucose-Capillary: 128 mg/dL — ABNORMAL HIGH (ref 70–99)
Glucose-Capillary: 116 mg/dL — ABNORMAL HIGH (ref 70–99)

## 2022-04-28 MED ORDER — ENSURE ENLIVE PO LIQD: 237.0000 mL | Freq: Three times a day (TID) | ORAL | 90 refills | Status: DC

## 2022-04-28 MED ORDER — HEPARIN SOD (PORK) LOCK FLUSH 100 UNIT/ML IV SOLN
250.0000 [IU] | INTRAVENOUS | Status: AC | PRN
  Administered 2022-04-28: 250 [IU]

## 2022-04-28 MED ORDER — ZINC OXIDE 12.8 % EX OINT: TOPICAL_OINTMENT | Freq: Two times a day (BID) | CUTANEOUS | 0 refills | Status: DC

## 2022-04-28 MED ORDER — PANTOPRAZOLE SODIUM 40 MG PO TBEC: 40.0000 mg | DELAYED_RELEASE_TABLET | Freq: Two times a day (BID) | ORAL | 1 refills | Status: DC

## 2022-04-28 MED ORDER — ACETAMINOPHEN 325 MG PO TABS: 650.0000 mg | ORAL_TABLET | Freq: Four times a day (QID) | ORAL | Status: DC | PRN

## 2022-04-28 NOTE — Progress Notes (Signed)
[address]       [place] [address]             [phone]      23 Days Post-Op Procedure(s) (LRB): XI ROBOTIC ASSISTED IVOR LEWIS ESOPHAGECTOMY (N/A) XI ROBOTIC ASSISTED JEJUNOSTOMY TUBE PLACEMENT (N/A) ESOPHAGOGASTRODUODENOSCOPY (EGD) (N/A) NODE DISSECTION INTERCOSTAL NERVE BLOCK Subjective: Feels okay, oral intake improving  Objective: Vital signs in last 24 hours: Temp:  [98.3 F (36.8 C)-99.4 F (37.4 C)] 98.4 F (36.9 C) (10/20 0345) Pulse Rate:  [88-100] 95 (10/20 0345) Cardiac Rhythm: Normal sinus rhythm (10/20 0400) Resp:  [20-25] 24 (10/20 0345) BP: (120-146)/(76-88) 131/76 (10/20 0345) SpO2:  [92 %-97 %] 97 % (10/20 0345) Weight:  [143.7 kg] 143.7 kg (10/20 0500)  Hemodynamic parameters for last 24 hours:    Intake/Output from previous day: 10/19 0701 - 10/20 0700 In: 230 [P.O.:230] Out: 1100 [Urine:1100] Intake/Output this shift: No intake/output data recorded.  General appearance: alert, cooperative, and no distress Heart: regular rate and rhythm Lungs: clear to auscultation bilaterally Abdomen: Benign Extremities: Minor edema Wound: Incisions healed well jejunostomy site stable in appearance with erythematous maceration and some purulent drainage persists but less  Lab Results: No results for input(s): "WBC", "HGB", "HCT", "PLT" in the last 72 hours. BMET: No results for input(s): "NA", "K", "CL", "CO2", "GLUCOSE", "BUN", "CREATININE", "CALCIUM" in the last 72 hours.  PT/INR: No results for input(s): "LABPROT", "INR" in the last 72 hours. ABG    Component Value Date/Time   PHART 7.41 04/06/2022 0332   HCO3 24.1 04/06/2022 0332   TCO2 28 11/13/2021 2122   ACIDBASEDEF 0.3 04/06/2022 0332   O2SAT 98 04/06/2022 0332   CBG (last 3)  Recent Labs    04/27/22 2016 04/27/22 2347 04/28/22 0351  GLUCAP 121* 113* 128*    Meds Scheduled Meds:  Chlorhexidine Gluconate Cloth  6 each Topical Daily   docusate sodium  100 mg  Oral BID   enoxaparin (LOVENOX) injection  75 mg Subcutaneous Q24H   feeding supplement  237 mL Oral TID BM   Gerhardt's butt cream   Topical BID   insulin aspart  0-24 Units Subcutaneous Q4H   olopatadine  1 drop Both Eyes BID   pantoprazole  40 mg Oral BID   sodium chloride flush  10-40 mL Intravenous Q12H   sodium chloride flush  10-40 mL Intracatheter Q12H   sodium chloride flush  3 mL Intravenous Q12H   Zinc Oxide   Topical BID   Continuous Infusions: PRN Meds:.acetaminophen, hydrALAZINE, HYDROcodone-acetaminophen, levalbuterol, lidocaine (PF), morphine injection, ondansetron (ZOFRAN) IV, sodium chloride flush, sodium chloride flush, sodium chloride flush  Xrays DG Chest 2 View  Result Date: 04/27/2022 CLINICAL DATA:  Follow-up of pleural effusion EXAM: CHEST - 2 VIEW COMPARISON:  04/25/2022 FINDINGS: Right Port-A-Cath tip at low SVC. Midline trachea. Mild cardiomegaly. Stable mediastinal contours, status post esophagectomy and gastric pull-through. Small left pleural effusion. No pneumothorax. No congestive failure. Left base and right infrahilar airspace disease are similar. IMPRESSION: No change since 04/25/2022. Small left pleural effusion with bilateral lower lung airspace disease, likely atelectasis. Electronically Signed   By: Abigail Miyamoto M.D.   On: 04/27/2022 08:13    Assessment/Plan: S/P Procedure(s) (LRB): XI ROBOTIC ASSISTED IVOR LEWIS ESOPHAGECTOMY (N/A) XI ROBOTIC ASSISTED JEJUNOSTOMY TUBE PLACEMENT (N/A) ESOPHAGOGASTRODUODENOSCOPY (EGD) (N/A) NODE DISSECTION INTERCOSTAL NERVE BLOCK POD #23   1 Tmax 99.4, vital signs stable oxygen saturations have been okay, short-term desaturations but improves with deep  breaths and resting 2 good urine output 3 no new labs or x-rays 4 stable for discharge 5 Home health arranged including DME 6 appetite and oral intake showing steady improvement     LOS: 23 days    Keith Giovanni PA-C Pager [phone] 04/28/2022

## 2022-04-28 NOTE — Progress Notes (Signed)
Occupational Therapy Treatment and Discharge Patient Details Name: Keith Moreno MRN: [id number] DOB: 06/24/1961 Today's Date: 04/28/2022   History of present illness 61 yo male admitted 9/27 for esophagectomy with J tube placement due to esophageal CA. PMhx: chronic low back pain, esophageal CA   OT comments  This 61 yo male admitted and underwent above presents to acute OT with all acute OT education completed with pt at an overall S level from a rollator standpoint for basic ADLs. His wife will be with him 24/7 at home so she can A prn. HHOT is still recommended to make sure all is well from an ADL standpoint in his own environment. We will D/C from acute OT.   Recommendations for follow up therapy are one component of a multi-disciplinary discharge planning process, led by the attending physician.  Recommendations may be updated based on patient status, additional functional criteria and insurance authorization.    Follow Up Recommendations  Home health OT    Assistance Recommended at Discharge Intermittent Supervision/Assistance  Patient can return home with the following  A little help with walking and/or transfers;A little help with bathing/dressing/bathroom;Assistance with cooking/housework;Assist for transportation;Help with stairs or ramp for entrance   Equipment Recommendations  Tub/shower seat       Precautions / Restrictions Precautions Precautions: Fall Precaution Comments: JP drain rt flank, PEG Restrictions Weight Bearing Restrictions: No       Mobility Bed Mobility               General bed mobility comments: OOB in chair upon arrival    Transfers Overall transfer level: Needs assistance Equipment used: Rollator (4 wheels) Transfers: Sit to/from Stand Sit to Stand: Supervision           General transfer comment: Ambulation on RA around unit with sats staying 90 or above on RA with HR going from 96-129     Balance Overall balance assessment:  Needs assistance Sitting-balance support: No upper extremity supported, Feet supported Sitting balance-Leahy Scale: Good     Standing balance support: No upper extremity supported Standing balance-Leahy Scale: Fair                             ADL either performed or assessed with clinical judgement   ADL Overall ADL's : Needs assistance/impaired       Grooming Details (indicate cue type and reason): We discussed that when he gets his rollator he can use it to sit on at sink when he needs to while grooming, to slowly build up standing tolerance adding 30 sec/1 minute to each time he stands for grooming.       Lower Body Bathing Details (indicate cue type and reason): As long as pt is seated he can cross his legs to get to his feet with S sit<>stand       Lower Body Dressing Details (indicate cue type and reason): He can cross his legs to get to his feet while seated with S sit<>stand       Toileting - Clothing Manipulation Details (indicate cue type and reason): We discussed the use of wet wipes for back peri care to get clean easier   Tub/Shower Transfer Details (indicate cue type and reason): We discussed the use of the shower seat he is getting from [place] as far as using it walk in shower v. tub--both have hand held shower heads   General ADL Comments: We discussed how standing takes more  energy than sitting to do the same task.    Extremity/Trunk Assessment Upper Extremity Assessment Upper Extremity Assessment: Overall WFL for tasks assessed            Vision Baseline Vision/History: 0 No visual deficits            Cognition Arousal/Alertness: Awake/alert Behavior During Therapy: WFL for tasks assessed/performed Overall Cognitive Status: Within Functional Limits for tasks assessed                                                     Pertinent Vitals/ Pain       Pain Assessment Pain Assessment: No/denies pain          Frequency  Min 2X/week        Progress Toward Goals  OT Goals(current goals can now be found in the care plan section)  Progress towards OT goals: Progressing toward goals (all acute OT education completed.)  Acute Rehab OT Goals Patient Stated Goal: to go home today OT Goal Formulation: With patient Time For Goal Achievement: 04/28/22 Potential to Achieve Goals: Good  Plan Discharge plan remains appropriate       AM-PAC OT "6 Clicks" Daily Activity     Outcome Measure   Help from another person eating meals?: None Help from another person taking care of personal grooming?: A Little Help from another person toileting, which includes using toliet, bedpan, or urinal?: A Little Help from another person bathing (including washing, rinsing, drying)?: A Little Help from another person to put on and taking off regular upper body clothing?: A Little Help from another person to put on and taking off regular lower body clothing?: A Little 6 Click Score: 19    End of Session Equipment Utilized During Treatment: Rollator (4 wheels)  OT Visit Diagnosis: Unsteadiness on feet (R26.81);Muscle weakness (generalized) (M62.81)   Activity Tolerance Patient tolerated treatment well   Patient Left in chair;with call bell/phone within reach   Nurse Communication  (no need for O2 for ambulation)        Time: 3291-9166 OT Time Calculation (min): 37 min  Charges: OT General Charges $OT Visit: 1 Visit OT Treatments $Self Care/Home Management : 23-37 mins  Golden Circle, OTR/L Acute Rehab Services [facility] [phone] Office [phone]    Almon Register 04/28/2022, 9:34 AM

## 2022-04-28 NOTE — TOC Transition Note (Addendum)
Transition of Care [facility]) - CM/SW Discharge Note   Patient Details  Name: Keith Moreno MRN: [id number] Date of Birth: 1960/08/04  Transition of [facility]) CM/SW Contact:  Zenon Mayo, RN Phone Number: 04/28/2022, 11:07 AM   Clinical Narrative:    Patient is for dc today, he has transport home, sign other is in the room with him.  Nurse mentioned that he may need sleep study, NCM informed Nurse to have MD to put in the dc summary so that this NCM can fax over to the PCP office and they can work on it outpatient.  He  has a walker to use to go home with at dc.  Wife states she has not received the rollator and the shower chair yet from the [place], the rep had stated it would take up to 5 business days from last Friday.  Cory with [facility]  states he has authorization for [facility] services.  Patient's PCP at the [place] is Lorelle Formosa fax number is [phone]. NCM  will fax dc summary today once completed.  Faxed DC summary.   Final next level of care: [place] Barriers to Discharge: No Barriers Identified   Patient Goals and CMS Choice Patient states their goals for this hospitalization and ongoing recovery are:: return home CMS Medicare.gov Compare Post Acute Care list provided to:: Patient Choice offered to / list presented to : Patient  Discharge Placement                       Discharge Plan and Services                DME Arranged: Walker rolling with seat, Shower stool ([place])   Date DME Agency Contacted: 04/21/22 Time DME Agency Contacted: 1000 Representative spoke with at DME Agency: [place]: RN, PT, OT [facility] Agency: [place] Date [place]: 04/12/22 Time [place]: 1549 Representative spoke with at [place] (by previous NCM)  Social Determinants of Health (SDOH) Interventions     Readmission Risk Interventions     No data to display

## 2022-04-28 NOTE — Progress Notes (Signed)
.  SATURATION QUALIFICATIONS: (This note is used to comply with regulatory documentation for home oxygen)  Patient Saturations on Room Air at Rest = 93%/HR 99  Patient Saturations on Room Air while Ambulating = 90%/HR 129  Patient and wife do report that at night while sleeping is O2 has dropped as low as 81%  Please briefly explain why patient needs home oxygen: Drop in O2 while sleeping.  Golden Circle, OTR/L Acute Rehab Services [facility] [phone] Office [phone]

## 2022-04-28 NOTE — Progress Notes (Signed)
Mobility Specialist Progress Note    04/28/22 0940  Mobility  Activity Ambulated with assistance in hallway  Level of Assistance Standby assist, set-up cues, supervision of patient - no hands on  Assistive Device Four wheel walker  Distance Ambulated (ft) 420 ft  Activity Response Tolerated well  Mobility Referral Yes  $Mobility charge 1 Mobility   Pre-Mobility: 96 HR, 94% SpO2 During Mobility: 122 HR, 94% SpO2 Post-Mobility: 105 HR, 97% SpO2  Pt received in chair and agreeable. No complaints on walk. Returned to chair with call bell in reach.    Hildred Alamin Mobility Specialist  Secure Chat Only

## 2022-05-04 ENCOUNTER — Other Ambulatory Visit: Payer: Self-pay | Admitting: *Deleted

## 2022-05-04 ENCOUNTER — Telehealth: Payer: Self-pay | Admitting: *Deleted

## 2022-05-04 ENCOUNTER — Ambulatory Visit: Payer: No Typology Code available for payment source | Admitting: Dietician

## 2022-05-04 DIAGNOSIS — C159 Malignant neoplasm of esophagus, unspecified: Secondary | ICD-10-CM

## 2022-05-04 NOTE — Progress Notes (Signed)
Symptom Management Consult note Keith Moreno    Patient Care Team: [facility] as PCP - General Earl Gala, Deliah Goody, RN as Oncology Nurse Navigator (Oncology) Cordelia Poche as Physician Assistant (Oncology) Orson Slick, MD as Consulting Physician (Hematology and Oncology)    Name of the patient: Keith Moreno  [id number]  [birth year]   Date of visit: 05/05/2022   Chief Complaint/Reason for visit: decreased PO intake   Current Therapy: Finished chemotherapy with carboplatin and paclitaxel 01/23/22    ASSESSMENT & PLAN: Patient is a 61 y.o. male  with oncologic history of esophageal adenocarcinoma followed by Dr. Lorenso Courier.  I have viewed most recent oncology note and lab work.    #) Decreased PO intake -Patient given IVF today -CBC without leukocytosis, hemoglobin 9.2 is consistent with prior.  No indication for blood transfusion at this time. -Patient given a liter of IV fluids as well as Pepcid for symptoms concerning for GERD.  #) Hypercalcemia - CMP with corrected calcium of 13.4. Patient has history of the same back in 09/23. At that time he was discharged from the ED as he was able to tolerate PO intake.  -Patient not tolerating adequate PO intake, unknown cause of hypercalcemia needing admission for further work up. -Discussed case with hospitalist Dr. Marylyn Ishihara who accepts patient for admission admission however would be best served at [facility] so that he can be seen by cardiothoracic surgery. -Patient is stable to wait for admission at home as it is unlikely he will get a bed during office hours per discussion with bed placement.  Discussed symptoms that would warrant emergent ED evaluation.   #) Esophageal adenocarcinoma -s/p Ivor-Lewis esophagectomy w/ [name] 04/05/22. Next appointment with surgeon is scheduled for 05/10/22. - Next appointment with oncologist is 05/18/22. -Patient complaining of difficulty swallowing, ?if  this is related to recent procedure.  His airway is intact and he is tolerating secretions.  I reached out to cardiothoracic APP to let them know of pending admission over to [facility] as cardiothoracic likely be consulted by hospitalist. Appreciate their assistance.  Heme/Onc History: Oncology History  Esophageal adenocarcinoma ([place])  12/12/2021 Initial Diagnosis   Esophageal adenocarcinoma ([place])   12/26/2021 - 01/23/2022 Chemotherapy   Patient is on Treatment Plan : ESOPHAGUS Carboplatin/PACLitaxel weekly x 6 weeks with XRT       12/26/2021 Cancer Staging   Staging form: Esophagus - Adenocarcinoma, AJCC 8th Edition - Clinical: Stage IIB (cT2, cN0, cM0, G2) - Signed by Orson Slick, MD on 12/26/2021 Total positive nodes: 0 Histologic grading system: 3 grade system       Interval history-: Keith Moreno is a 61 y.o. male with oncologic history as above presenting to [facility] today with chief complaint of decreased p.o. intake, generalized weakness and dizziness x4 days.  Patient is accompanied by his spouse who provides additional history.  Patient states his symptoms have been progressively worsening.  He estimates 32 ounces of fluid intake in the last 24 hours.  Patient states when drinking he is only able to take a sip at a time and then feels like the fluid gets stuck in his throat causing him to burp to be able to drink another sip. He takes Protonix, unsure if this is helping at all. He has not had any vomiting.  He states his dizziness is worse and changing positions.  He has not had any falls or head injury.  Patient has been  trying to stay hydrated by drinking Ensure, boost and water. He takes multiple over the counter vitamins including multivitamin, Vitamin C, B12, D, and E. He admits to decreased urinary output and comments that his urine is pale yellow in color.  Spouse reports ever since having the surgery he does not eat very much, only takes small bites of things and has not had  a full meal.  Patient denies fever, cough, chest pain, abdominal pain.     ROS  All other systems are reviewed and are negative for acute change except as noted in the HPI.    Allergies  Allergen Reactions   Procaine     Other reaction(s): Other, Respiratory Distress   Chocolate     Hyperactivity, respiratory distress   Other     Novocaine- respiratory distress     Past Medical History:  Diagnosis Date   Anxiety    Cancer ([place])    Esophageal Cancer   COVID 2022   mild case   Dyspnea    r/t chemo and radiation   Headache    History of kidney stones    Hypertension    Kidney infection    at age 66   Pneumonia    61 years old   Pre-diabetes    PTSD (post-traumatic stress disorder)    per pt, if woken up suddenly he "cocks back arm" as if to punch but usually wakes up enough to come to before he hits anyone     Past Surgical History:  Procedure Laterality Date   BIOPSY  11/13/2021   Procedure: BIOPSY;  Surgeon: Jerene Bears, MD;  Location: [place];  Service: Gastroenterology;;   BIOPSY  12/14/2021   Procedure: BIOPSY;  Surgeon: Irving Copas., MD;  Location: [facility] ENDOSCOPY;  Service: Gastroenterology;;   BRONCHIAL BRUSHINGS  12/14/2021   Procedure: BRONCHIAL BRUSHINGS;  Surgeon: Collene Gobble, MD;  Location: [place];  Service: Cardiopulmonary;;   BRONCHIAL NEEDLE ASPIRATION BIOPSY  12/14/2021   Procedure: BRONCHIAL NEEDLE ASPIRATION BIOPSIES;  Surgeon: Collene Gobble, MD;  Location: [place] ENDOSCOPY;  Service: Cardiopulmonary;;   COLONOSCOPY  2018   CYST EXCISION  2022   left side of head   ESOPHAGECTOMY  04/05/2022   ESOPHAGOGASTRODUODENOSCOPY N/A 11/13/2021   Procedure: ESOPHAGOGASTRODUODENOSCOPY (EGD);  Surgeon: Jerene Bears, MD;  [facility] ENDOSCOPY;  Service: Gastroenterology;  Laterality: N/A;   ESOPHAGOGASTRODUODENOSCOPY N/A 04/05/2022   Procedure: ESOPHAGOGASTRODUODENOSCOPY (EGD);  Surgeon: Lajuana Matte, MD;  Location: [facility] OR;   Service: Thoracic;  Laterality: N/A;   ESOPHAGOGASTRODUODENOSCOPY (EGD) WITH PROPOFOL N/A 12/14/2021   Procedure: ESOPHAGOGASTRODUODENOSCOPY (EGD) WITH PROPOFOL;  Surgeon: Irving Copas., MD;  Location: [place];  Service: Gastroenterology;  Laterality: N/A;   EUS N/A 12/14/2021   Procedure: UPPER ENDOSCOPIC ULTRASOUND (EUS) LINEAR;  Surgeon: Irving Copas., MD;  Location: [place];  Service: Gastroenterology;  Laterality: N/A;   FINE NEEDLE ASPIRATION  12/14/2021   Procedure: FINE NEEDLE ASPIRATION (FNA) LINEAR;  Surgeon: Irving Copas., MD;  Location: [place];  Service: Gastroenterology;;   FOREIGN BODY REMOVAL  11/13/2021   Procedure: FOREIGN BODY REMOVAL;  Surgeon: Jerene Bears, MD;  Location: [place];  Service: Gastroenterology;;   HUMERUS FRACTURE SURGERY Right    INTERCOSTAL NERVE BLOCK  04/05/2022   Procedure: INTERCOSTAL NERVE BLOCK;  Surgeon: Lajuana Matte, MD;  Location: [place];  Service: Thoracic;;   IR IMAGING GUIDED PORT INSERTION  12/23/2021   IR THORACENTESIS ASP PLEURAL SPACE W/IMG  GUIDE  04/19/2022   MULTIPLE TOOTH EXTRACTIONS  2015   NODE DISSECTION  04/05/2022   Procedure: NODE DISSECTION;  Surgeon: Lajuana Matte, MD;  Location: [place];  Service: Thoracic;;   TONSILLECTOMY AND ADENOIDECTOMY     as a child   VIDEO BRONCHOSCOPY WITH ENDOBRONCHIAL ULTRASOUND Bilateral 12/14/2021   Procedure: VIDEO BRONCHOSCOPY WITH ENDOBRONCHIAL ULTRASOUND;  Surgeon: Collene Gobble, MD;  Location: [place];  Service: Cardiopulmonary;  Laterality: Bilateral;    Social History   Socioeconomic History   Marital status: Single    Spouse name: Not on file   Number of children: Not on file   Years of education: Not on file   Highest education level: Not on file  Occupational History   Not on file  Tobacco Use   Smoking status: Former    Types: Cigars, Cigarettes, Pipe    Quit date: 35    Years since quitting: 40.8    Smokeless tobacco: Former    Types: Chew    Quit date: 1983  Vaping Use   Vaping Use: Never used  Substance and Sexual Activity   Alcohol use: Yes    Comment: Rarely   Drug use: No   Sexual activity: Yes  Other Topics Concern   Not on file  Social History Narrative   Not on file   Social Determinants of Health   Financial Resource Strain: Not on file  Food Insecurity: No Food Insecurity (04/06/2022)   Hunger Vital Sign    Worried About Running Out of Food in the Last Year: Never true    Ran Out of Food in the Last Year: Never true  Transportation Needs: No Transportation Needs (04/06/2022)   PRAPARE - Hydrologist (Medical): No    Lack of Transportation (Non-Medical): No  Physical Activity: Not on file  Stress: Not on file  Social Connections: Not on file  Intimate Partner Violence: Not At Risk (04/06/2022)   Humiliation, Afraid, Rape, and Kick questionnaire    Fear of Current or Ex-Partner: No    Emotionally Abused: No    Physically Abused: No    Sexually Abused: No    Family History  Problem Relation Age of Onset   Hypertension Mother    Diabetes Mother    Bipolar disorder Mother    Hypertension Father    Kidney cancer Sister    Colon cancer Paternal Uncle    Cancer Cousin    Bladder Cancer Cousin    Cancer Cousin      Current Outpatient Medications:    acetaminophen (TYLENOL) 325 MG tablet, Take 2 tablets (650 mg total) by mouth every 6 (six) hours as needed for fever., Disp: , Rfl:    Alogliptin Benzoate 25 MG TABS, Take 12.5 mg by mouth at bedtime., Disp: , Rfl:    amLODipine (NORVASC) 10 MG tablet, Take 10 mg by mouth at bedtime., Disp: , Rfl:    Ascorbic Acid (VITAMIN C) 1000 MG tablet, Take 2,000 mg by mouth every morning., Disp: , Rfl:    Cholecalciferol (VITAMIN D3) 50 MCG (2000 UT) capsule, Take 20,000 Units by mouth at bedtime., Disp: , Rfl:    CINNAMON PO, Take 4,000 mg by mouth 2 (two) times daily., Disp: , Rfl:     Cyanocobalamin (B-12 PO), Take 1 tablet by mouth daily., Disp: , Rfl:    feeding supplement (ENSURE ENLIVE / ENSURE PLUS) LIQD, Take 237 mLs by mouth 3 (three) times daily between meals., Disp:  237 mL, Rfl: 90   FERROUS SULFATE PO, Take 1 tablet by mouth daily., Disp: , Rfl:    lidocaine (LIDODERM) 5 %, Place 1 patch onto the skin daily as needed (pain)., Disp: , Rfl:    lidocaine-prilocaine (EMLA) cream, Apply 1 application. topically as needed., Disp: 30 g, Rfl: 0   lisinopril (PRINIVIL,ZESTRIL) 40 MG tablet, Take 40 mg by mouth at bedtime., Disp: , Rfl:    Melatonin 10 MG TABS, Take 40 mg by mouth at bedtime as needed (Sleep)., Disp: , Rfl:    Multiple Vitamin (MULTIVITAMIN WITH MINERALS) TABS tablet, Take 1 tablet by mouth daily., Disp: , Rfl:    olopatadine (PATANOL) 0.1 % ophthalmic solution, Place 1 drop into both eyes 2 (two) times daily., Disp: , Rfl:    ondansetron (ZOFRAN-ODT) 8 MG disintegrating tablet, Take 1 tablet (8 mg total) by mouth every 8 (eight) hours as needed for nausea or vomiting., Disp: 90 tablet, Rfl: 3   pantoprazole (PROTONIX) 40 MG tablet, Take 1 tablet (40 mg total) by mouth 2 (two) times daily., Disp: 30 tablet, Rfl: 1   pioglitazone (ACTOS) 30 MG tablet, Take 30 mg by mouth every morning., Disp: , Rfl:    vitamin E 180 MG (400 UNITS) capsule, Take 1,200 Units by mouth 2 (two) times daily., Disp: , Rfl:    Zinc Oxide (TRIPLE PASTE) 12.8 % ointment, Apply topically 2 (two) times daily. To J-Tube site, Disp: 56.7 g, Rfl: 0  PHYSICAL EXAM: ECOG FS:2 - Symptomatic, <50% confined to bed    Vitals:   05/05/22 1028  BP: 138/76  Pulse: 96  Resp: 16  Temp: 97.9 F (36.6 C)  TempSrc: Temporal  SpO2: 97%  Weight: (!) 320 lb 11.2 oz (145.5 kg)   Physical Exam Vitals and nursing note reviewed.  Constitutional:      Appearance: He is well-developed. He is obese. He is not ill-appearing or toxic-appearing.  HENT:     Head: Normocephalic.     Nose: Nose normal.      Mouth/Throat:     Mouth: Mucous membranes are dry.  Eyes:     Conjunctiva/sclera: Conjunctivae normal.  Neck:     Vascular: No JVD.  Cardiovascular:     Rate and Rhythm: Normal rate and regular rhythm.     Pulses: Normal pulses.     Heart sounds: Normal heart sounds.  Pulmonary:     Effort: Pulmonary effort is normal.     Breath sounds: Normal breath sounds.  Abdominal:     General: There is no distension.     Palpations: Abdomen is soft.     Comments: Generalized abdominal tenderness with voluntary guarding  Musculoskeletal:     Cervical back: Normal range of motion.     Right lower leg: No edema.     Left lower leg: No edema.  Skin:    General: Skin is warm and dry.     Capillary Refill: Capillary refill takes less than 2 seconds.  Neurological:     Mental Status: He is oriented to person, place, and time.        LABORATORY DATA: I have reviewed the data as listed    Latest Ref Rng & Units 05/05/2022    9:57 AM 04/22/2022    4:52 AM 04/21/2022    4:25 AM  CBC  WBC 4.0 - 10.5 K/uL 5.5  6.1  6.5   Hemoglobin 13.0 - 17.0 g/dL 9.2  8.0  7.2   Hematocrit 39.0 -  52.0 % 27.9  25.6  21.8   Platelets 150 - 400 K/uL 315  346  337         Latest Ref Rng & Units 05/05/2022    9:57 AM 04/21/2022    4:25 AM 04/19/2022    5:00 AM  CMP  Glucose 70 - 99 mg/dL 112  236  209   BUN 8 - 23 mg/dL '25  25  23   '$ Creatinine 0.61 - 1.24 mg/dL 1.13  0.86  0.87   Sodium 135 - 145 mmol/L 135  135  135   Potassium 3.5 - 5.1 mmol/L 3.8  4.4  4.2   Chloride 98 - 111 mmol/L 101  100  100   CO2 22 - 32 mmol/L '31  30  28   '$ Calcium 8.9 - 10.3 mg/dL 12.8  10.5  10.5   Total Protein 6.5 - 8.1 g/dL 6.6   5.2   Total Bilirubin 0.3 - 1.2 mg/dL 0.5   0.6   Alkaline Phos 38 - 126 U/L 129   176   AST 15 - 41 U/L 17   18   ALT 0 - 44 U/L 18   22        RADIOGRAPHIC STUDIES (from last 24 hours if applicable) I have personally reviewed the radiological images as listed and agreed with the  findings in the report. No results found.      Visit Diagnosis: 1. Esophageal adenocarcinoma ([place])   2. Port-A-Cath in place   3. Hypercalcemia   4. Poor fluid intake      No orders of the defined types were placed in this encounter.   All questions were answered. No barriers to learning was detected.  I have spent a total of 30 minutes minutes of face-to-face and non-face-to-face time, preparing to see the patient, obtaining and/or reviewing separately obtained history, performing a medically appropriate examination, counseling and educating the patient, ordering tests, documenting clinical information in the electronic health record, and care coordination (communications with other health care professionals or caregivers).    Thank you for allowing me to participate in the care of this patient.    Barrie Folk, PA-C Department of Hematology/Oncology [facility] at [facility] Phone: [phone]  [phone]    05/05/2022 2:21 PM  I have read the above note and personally examined the patient. I agree with the assessment and plan as noted above.  Briefly Mr. Kinsler Soeder is a 61 year old male with medical history significant for esophageal adenocarcinoma status post neoadjuvant chemotherapy and surgical resection who presents with severe dysphagia and failure to thrive.  Given his findings I am deeply concerned there may be a persisting postsurgical issue.  We are going to have him admitted to [facility] with repeat imaging and surgical evaluation.  The patient is reluctant to be admitted but is agreeable.  We will also need to further evaluate his hypercalcemia.   Ledell Peoples, MD Department of Hematology/Oncology [place] at [facility] Phone: [phone] Pager: [phone] Email: [email or web address]

## 2022-05-04 NOTE — Progress Notes (Signed)
Nutrition Follow Up: Reached out to patient at home telephone number.    Reason for Assessment: Recent hospitalized and diagnosed by IP RD with Moderate malnutrition   ASSESSMENT: Patient reports very fatigued and he feels like he is dehydrated and in need of IVF.  He reports struggling with thin liquids and dysphagia especially with plain water.  He relayed that he thinks he's only getting 24 oz of water per day.  He is attempting some PO intake every 90 minutes to 2 hours.  He finds Gatorade, and milk, and VHC Boost easier to tolerate without feeling like it is backing up and feeling like he's going to vomit.  He also states he has been eating other consistencies without as much difficulty (oatmeal with milk, [place] food that he tries to chew really well).  Reports has few teeth and chewing food very well is a challenge.  He states he does take BP at home and it was 120/75 with pulse of 92.  He's urinating every couple of hours but not a lot of volume and color is straw yellow.  Asked to pinch skin at clavicle and note response.    Nutrition Focused Physical Exam: unable to perform NFPE  Labs: no new labs since discharged   Anthropometrics:   Height: 72" Weight:  10/20/23316.8#  BMI: 42.97  NUTRITION DIAGNOSIS:  Inadequate PO intake since Ivor Lewis esophagectomy r/t inability to tolerate thin liquids and adequate volume to maintain strength and stamina AEB PO recall and symptoms relayed   MALNUTRITION DIAGNOSIS: PER IP RD.Marland KitchenMarland KitchenMarland KitchenModerate Malnutrition related to chronic illness (esophageal cancer) as evidenced by mild muscle depletion, percent weight loss (9.2% weight loss in 5 months).   Ongoing, being addressed via diet advancement and oral nutrition supplements   INTERVENTION:  Encouraged thicker liquids and continues use if ONS. Encouraged blended and pureed foods with added fluids to aid in hydration.  Suggested trial of jello and Greek yogurts as fluid resources.  Offered  recipes for blended shakes and pureed options he decline stating has many resources already.  Sent text with [place] phone # and my contact information   MONITORING, EVALUATION, GOAL: weight trends, nutrition impact symptoms, PO intake, labs   Next Visit: Remote next week  [name], RDN, LDN Registered Dietitian, [place] (Usual office hours: Tuesday-Thursday) Mobile: [phone] Remote Office: [phone]

## 2022-05-04 NOTE — Telephone Encounter (Signed)
Received call from pt. He states he is having trouble getting enough fluids in. He is s/p esophageal surgery for cancer. He has completed his chemotherapy. He has been home from the hospital for 1 week. He states he ends up swallowing a lot of air and burps so much he cannot swallow enough. Discussed with Dr. Lorenso Courier. He states we can see him in Symptom Management today or tomorrow with labs and also have pt contact has Psychologist, sport and exercise. TC back to patient and advised of the above plan. Advised we can see him tomorrow morning for labs and [facility] with IV fluids.  He is agreeable to this plan. He states he does have an appt with the surgeon on 05/10/22  Scheduling message sent. Lab orders placed.

## 2022-05-05 ENCOUNTER — Inpatient Hospital Stay: Payer: No Typology Code available for payment source | Attending: Physician Assistant

## 2022-05-05 ENCOUNTER — Encounter (HOSPITAL_COMMUNITY): Payer: Self-pay

## 2022-05-05 ENCOUNTER — Inpatient Hospital Stay (HOSPITAL_COMMUNITY): Payer: No Typology Code available for payment source

## 2022-05-05 ENCOUNTER — Inpatient Hospital Stay (HOSPITAL_COMMUNITY)
Admission: AD | Admit: 2022-05-05 | Discharge: 2022-06-05 | DRG: 326 | Disposition: A | Discharge status: Home Health | Payer: No Typology Code available for payment source | Source: Ambulatory Visit | Attending: Thoracic Surgery (Cardiothoracic Vascular Surgery) | Admitting: Thoracic Surgery (Cardiothoracic Vascular Surgery)

## 2022-05-05 ENCOUNTER — Inpatient Hospital Stay (HOSPITAL_BASED_OUTPATIENT_CLINIC_OR_DEPARTMENT_OTHER): Payer: No Typology Code available for payment source | Admitting: Physician Assistant

## 2022-05-05 ENCOUNTER — Inpatient Hospital Stay: Payer: No Typology Code available for payment source

## 2022-05-05 VITALS — BP 138/76 | HR 96 | Temp 97.9°F | Resp 16 | Wt 320.7 lb

## 2022-05-05 DIAGNOSIS — R638 Other symptoms and signs concerning food and fluid intake: Secondary | ICD-10-CM | POA: Diagnosis not present

## 2022-05-05 DIAGNOSIS — C155 Malignant neoplasm of lower third of esophagus: Secondary | ICD-10-CM | POA: Diagnosis not present

## 2022-05-05 DIAGNOSIS — G4734 Idiopathic sleep related nonobstructive alveolar hypoventilation: Secondary | ICD-10-CM | POA: Diagnosis present

## 2022-05-05 DIAGNOSIS — Z79899 Other long term (current) drug therapy: Secondary | ICD-10-CM | POA: Diagnosis not present

## 2022-05-05 DIAGNOSIS — Z800 Family history of malignant neoplasm of digestive organs: Secondary | ICD-10-CM

## 2022-05-05 DIAGNOSIS — K223 Perforation of esophagus: Secondary | ICD-10-CM | POA: Diagnosis present

## 2022-05-05 DIAGNOSIS — Z8051 Family history of malignant neoplasm of kidney: Secondary | ICD-10-CM | POA: Insufficient documentation

## 2022-05-05 DIAGNOSIS — R251 Tremor, unspecified: Secondary | ICD-10-CM | POA: Diagnosis not present

## 2022-05-05 DIAGNOSIS — T80818A Extravasation of other vesicant agent, initial encounter: Secondary | ICD-10-CM | POA: Diagnosis present

## 2022-05-05 DIAGNOSIS — I10 Essential (primary) hypertension: Secondary | ICD-10-CM | POA: Diagnosis present

## 2022-05-05 DIAGNOSIS — K9189 Other postprocedural complications and disorders of digestive system: Principal | ICD-10-CM | POA: Diagnosis present

## 2022-05-05 DIAGNOSIS — Z7984 Long term (current) use of oral hypoglycemic drugs: Secondary | ICD-10-CM

## 2022-05-05 DIAGNOSIS — Z87440 Personal history of urinary (tract) infections: Secondary | ICD-10-CM

## 2022-05-05 DIAGNOSIS — K5900 Constipation, unspecified: Secondary | ICD-10-CM | POA: Diagnosis not present

## 2022-05-05 DIAGNOSIS — Z8052 Family history of malignant neoplasm of bladder: Secondary | ICD-10-CM | POA: Diagnosis not present

## 2022-05-05 DIAGNOSIS — C159 Malignant neoplasm of esophagus, unspecified: Secondary | ICD-10-CM

## 2022-05-05 DIAGNOSIS — R0902 Hypoxemia: Secondary | ICD-10-CM | POA: Diagnosis present

## 2022-05-05 DIAGNOSIS — Z8616 Personal history of COVID-19: Secondary | ICD-10-CM

## 2022-05-05 DIAGNOSIS — E162 Hypoglycemia, unspecified: Secondary | ICD-10-CM | POA: Diagnosis not present

## 2022-05-05 DIAGNOSIS — E860 Dehydration: Secondary | ICD-10-CM | POA: Diagnosis present

## 2022-05-05 DIAGNOSIS — J9589 Other postprocedural complications and disorders of respiratory system, not elsewhere classified: Secondary | ICD-10-CM | POA: Diagnosis present

## 2022-05-05 DIAGNOSIS — Z6841 Body Mass Index (BMI) 40.0 and over, adult: Secondary | ICD-10-CM

## 2022-05-05 DIAGNOSIS — D649 Anemia, unspecified: Secondary | ICD-10-CM | POA: Diagnosis present

## 2022-05-05 DIAGNOSIS — Z809 Family history of malignant neoplasm, unspecified: Secondary | ICD-10-CM | POA: Insufficient documentation

## 2022-05-05 DIAGNOSIS — K2270 Barrett's esophagus without dysplasia: Secondary | ICD-10-CM | POA: Insufficient documentation

## 2022-05-05 DIAGNOSIS — Z818 Family history of other mental and behavioral disorders: Secondary | ICD-10-CM | POA: Insufficient documentation

## 2022-05-05 DIAGNOSIS — Z87891 Personal history of nicotine dependence: Secondary | ICD-10-CM | POA: Insufficient documentation

## 2022-05-05 DIAGNOSIS — Z87442 Personal history of urinary calculi: Secondary | ICD-10-CM

## 2022-05-05 DIAGNOSIS — N179 Acute kidney failure, unspecified: Secondary | ICD-10-CM | POA: Diagnosis not present

## 2022-05-05 DIAGNOSIS — B961 Klebsiella pneumoniae [K. pneumoniae] as the cause of diseases classified elsewhere: Secondary | ICD-10-CM | POA: Diagnosis present

## 2022-05-05 DIAGNOSIS — D72819 Decreased white blood cell count, unspecified: Secondary | ICD-10-CM | POA: Diagnosis not present

## 2022-05-05 DIAGNOSIS — L8990 Pressure ulcer of unspecified site, unspecified stage: Secondary | ICD-10-CM | POA: Insufficient documentation

## 2022-05-05 DIAGNOSIS — K9413 Enterostomy malfunction: Secondary | ICD-10-CM | POA: Diagnosis not present

## 2022-05-05 DIAGNOSIS — Z9049 Acquired absence of other specified parts of digestive tract: Secondary | ICD-10-CM

## 2022-05-05 DIAGNOSIS — Z8701 Personal history of pneumonia (recurrent): Secondary | ICD-10-CM

## 2022-05-05 DIAGNOSIS — E43 Unspecified severe protein-calorie malnutrition: Secondary | ICD-10-CM | POA: Diagnosis not present

## 2022-05-05 DIAGNOSIS — Y832 Surgical operation with anastomosis, bypass or graft as the cause of abnormal reaction of the patient, or of later complication, without mention of misadventure at the time of the procedure: Secondary | ICD-10-CM | POA: Diagnosis present

## 2022-05-05 DIAGNOSIS — Z95828 Presence of other vascular implants and grafts: Secondary | ICD-10-CM

## 2022-05-05 DIAGNOSIS — B9689 Other specified bacterial agents as the cause of diseases classified elsewhere: Secondary | ICD-10-CM | POA: Diagnosis present

## 2022-05-05 DIAGNOSIS — R627 Adult failure to thrive: Secondary | ICD-10-CM | POA: Diagnosis present

## 2022-05-05 DIAGNOSIS — Z9889 Other specified postprocedural states: Secondary | ICD-10-CM

## 2022-05-05 DIAGNOSIS — B964 Proteus (mirabilis) (morganii) as the cause of diseases classified elsewhere: Secondary | ICD-10-CM | POA: Diagnosis present

## 2022-05-05 DIAGNOSIS — Z884 Allergy status to anesthetic agent status: Secondary | ICD-10-CM

## 2022-05-05 DIAGNOSIS — Z833 Family history of diabetes mellitus: Secondary | ICD-10-CM | POA: Diagnosis not present

## 2022-05-05 DIAGNOSIS — J690 Pneumonitis due to inhalation of food and vomit: Secondary | ICD-10-CM | POA: Diagnosis present

## 2022-05-05 DIAGNOSIS — L89313 Pressure ulcer of right buttock, stage 3: Secondary | ICD-10-CM | POA: Diagnosis not present

## 2022-05-05 DIAGNOSIS — J9851 Mediastinitis: Secondary | ICD-10-CM

## 2022-05-05 DIAGNOSIS — F419 Anxiety disorder, unspecified: Secondary | ICD-10-CM | POA: Diagnosis present

## 2022-05-05 DIAGNOSIS — R7303 Prediabetes: Secondary | ICD-10-CM | POA: Diagnosis present

## 2022-05-05 DIAGNOSIS — J90 Pleural effusion, not elsewhere classified: Secondary | ICD-10-CM | POA: Diagnosis present

## 2022-05-05 DIAGNOSIS — F4310 Post-traumatic stress disorder, unspecified: Secondary | ICD-10-CM | POA: Diagnosis present

## 2022-05-05 DIAGNOSIS — J9811 Atelectasis: Secondary | ICD-10-CM | POA: Diagnosis not present

## 2022-05-05 DIAGNOSIS — E870 Hyperosmolality and hypernatremia: Secondary | ICD-10-CM | POA: Diagnosis not present

## 2022-05-05 DIAGNOSIS — K219 Gastro-esophageal reflux disease without esophagitis: Secondary | ICD-10-CM | POA: Diagnosis present

## 2022-05-05 DIAGNOSIS — I9789 Other postprocedural complications and disorders of the circulatory system, not elsewhere classified: Secondary | ICD-10-CM | POA: Diagnosis not present

## 2022-05-05 DIAGNOSIS — Z8249 Family history of ischemic heart disease and other diseases of the circulatory system: Secondary | ICD-10-CM | POA: Insufficient documentation

## 2022-05-05 DIAGNOSIS — G4730 Sleep apnea, unspecified: Secondary | ICD-10-CM | POA: Diagnosis present

## 2022-05-05 DIAGNOSIS — R1310 Dysphagia, unspecified: Secondary | ICD-10-CM

## 2022-05-05 DIAGNOSIS — I440 Atrioventricular block, first degree: Secondary | ICD-10-CM | POA: Diagnosis present

## 2022-05-05 DIAGNOSIS — I4510 Unspecified right bundle-branch block: Secondary | ICD-10-CM | POA: Diagnosis present

## 2022-05-05 DIAGNOSIS — Z91018 Allergy to other foods: Secondary | ICD-10-CM

## 2022-05-05 LAB — CMP (CANCER CENTER ONLY)
ALT: 18 U/L (ref 0–44)
AST: 17 U/L (ref 15–41)
Albumin: 3.2 g/dL — ABNORMAL LOW (ref 3.5–5.0)
Alkaline Phosphatase: 129 U/L — ABNORMAL HIGH (ref 38–126)
Anion gap: 3 — ABNORMAL LOW (ref 5–15)
BUN: 25 mg/dL — ABNORMAL HIGH (ref 8–23)
CO2: 31 mmol/L (ref 22–32)
Calcium: 12.8 mg/dL — ABNORMAL HIGH (ref 8.9–10.3)
Chloride: 101 mmol/L (ref 98–111)
Creatinine: 1.13 mg/dL (ref 0.61–1.24)
GFR, Estimated: >60 mL/min (ref >60)
Glucose, Bld: 112 mg/dL — ABNORMAL HIGH (ref 70–99)
Potassium: 3.8 mmol/L (ref 3.5–5.1)
Sodium: 135 mmol/L (ref 135–145)
Total Bilirubin: 0.5 mg/dL (ref 0.3–1.2)
Total Protein: 6.6 g/dL (ref 6.5–8.1)

## 2022-05-05 LAB — CBC WITH DIFFERENTIAL (CANCER CENTER ONLY)
Abs Immature Granulocytes: 0.02 10*3/uL (ref 0.00–0.07)
Basophils Absolute: 0 10*3/uL (ref 0.0–0.1)
Basophils Relative: 0 %
Eosinophils Absolute: 0.1 10*3/uL (ref 0.0–0.5)
Eosinophils Relative: 1 %
HCT: 27.9 % — ABNORMAL LOW (ref 39.0–52.0)
Hemoglobin: 9.2 g/dL — ABNORMAL LOW (ref 13.0–17.0)
Immature Granulocytes: 0 %
Lymphocytes Relative: 4 %
Lymphs Abs: 0.2 10*3/uL — ABNORMAL LOW (ref 0.7–4.0)
MCH: 29 pg (ref 26.0–34.0)
MCHC: 33 g/dL (ref 30.0–36.0)
MCV: 88 fL (ref 80.0–100.0)
Monocytes Absolute: 0.6 10*3/uL (ref 0.1–1.0)
Monocytes Relative: 11 %
Neutro Abs: 4.6 10*3/uL (ref 1.7–7.7)
Neutrophils Relative %: 84 %
Platelet Count: 315 10*3/uL (ref 150–400)
RBC: 3.17 MIL/uL — ABNORMAL LOW (ref 4.22–5.81)
RDW: 13.5 % (ref 11.5–15.5)
WBC Count: 5.5 10*3/uL (ref 4.0–10.5)
nRBC: 0 % (ref 0.0–0.2)

## 2022-05-05 LAB — GLUCOSE, CAPILLARY: Glucose-Capillary: 96 mg/dL (ref 70–99)

## 2022-05-05 LAB — SAMPLE TO BLOOD BANK

## 2022-05-05 MED ORDER — ACETAMINOPHEN 325 MG PO TABS
650.0000 mg | ORAL_TABLET | Freq: Four times a day (QID) | ORAL | Status: DC | PRN
  Administered 2022-05-06 – 2022-05-08 (×5): 650 mg via ORAL
  Filled 2022-05-05 (×5): qty 2

## 2022-05-05 MED ORDER — ACETAMINOPHEN 650 MG RE SUPP: 650.0000 mg | Freq: Four times a day (QID) | RECTAL | Status: DC | PRN

## 2022-05-05 MED ORDER — SODIUM CHLORIDE 0.9 % IV SOLN: Freq: Once | INTRAVENOUS | Status: AC

## 2022-05-05 MED ORDER — HYDRALAZINE HCL 20 MG/ML IJ SOLN
10.0000 mg | Freq: Four times a day (QID) | INTRAMUSCULAR | Status: DC | PRN
  Administered 2022-05-15: 10 mg via INTRAVENOUS
  Filled 2022-05-05 (×2): qty 1

## 2022-05-05 MED ORDER — HEPARIN SODIUM (PORCINE) 5000 UNIT/ML IJ SOLN
5000.0000 [IU] | Freq: Three times a day (TID) | INTRAMUSCULAR | Status: DC
  Administered 2022-05-05 – 2022-05-08 (×10): 5000 [IU] via SUBCUTANEOUS
  Filled 2022-05-05 (×10): qty 1

## 2022-05-05 MED ORDER — SODIUM CHLORIDE 0.9% FLUSH
3.0000 mL | Freq: Two times a day (BID) | INTRAVENOUS | Status: DC
  Administered 2022-05-05 – 2022-05-08 (×5): 3 mL via INTRAVENOUS

## 2022-05-05 MED ORDER — SODIUM CHLORIDE 0.9% FLUSH
10.0000 mL | Freq: Once | INTRAVENOUS | Status: AC
  Administered 2022-05-05: 10 mL

## 2022-05-05 MED ORDER — SENNOSIDES-DOCUSATE SODIUM 8.6-50 MG PO TABS
1.0000 | ORAL_TABLET | Freq: Every evening | ORAL | Status: DC | PRN
  Administered 2022-05-06 – 2022-05-08 (×2): 1 via ORAL
  Filled 2022-05-05 (×2): qty 1

## 2022-05-05 MED ORDER — CHLORHEXIDINE GLUCONATE CLOTH 2 % EX PADS
6.0000 | MEDICATED_PAD | Freq: Every day | CUTANEOUS | Status: DC
  Administered 2022-05-06 – 2022-05-08 (×3): 6 via TOPICAL

## 2022-05-05 MED ORDER — SODIUM CHLORIDE 0.9% FLUSH: 10.0000 mL | INTRAVENOUS | Status: DC | PRN

## 2022-05-05 MED ORDER — ONDANSETRON HCL 4 MG/2ML IJ SOLN
4.0000 mg | Freq: Four times a day (QID) | INTRAMUSCULAR | Status: DC | PRN
  Administered 2022-05-07: 4 mg via INTRAVENOUS
  Filled 2022-05-05: qty 2

## 2022-05-05 MED ORDER — FAMOTIDINE IN NACL 20-0.9 MG/50ML-% IV SOLN
20.0000 mg | Freq: Once | INTRAVENOUS | Status: AC
  Administered 2022-05-05: 20 mg via INTRAVENOUS
  Filled 2022-05-05: qty 50

## 2022-05-05 MED ORDER — ONDANSETRON HCL 4 MG PO TABS: 4.0000 mg | ORAL_TABLET | Freq: Four times a day (QID) | ORAL | Status: DC | PRN

## 2022-05-05 MED ORDER — SODIUM CHLORIDE 0.9% FLUSH
10.0000 mL | Freq: Two times a day (BID) | INTRAVENOUS | Status: DC
  Administered 2022-05-05 – 2022-05-07 (×3): 10 mL

## 2022-05-05 MED ORDER — SODIUM CHLORIDE 0.9 % IV SOLN: INTRAVENOUS | Status: AC

## 2022-05-05 MED ORDER — HEPARIN SOD (PORK) LOCK FLUSH 100 UNIT/ML IV SOLN
500.0000 [IU] | Freq: Once | INTRAVENOUS | Status: AC
  Administered 2022-05-05: 500 [IU]

## 2022-05-05 NOTE — Progress Notes (Signed)
Patient arrived on unit via wheelchair. Patient transferred to bed without difficulty. Patient oriented to room and unit. NAD noted at this time. Notified Admitting that patient had arrived.

## 2022-05-05 NOTE — H&P (Signed)
History and Physical    [name] NWG:[id number] DOB: [birth year] DOA: 05/05/2022  PCP: [facility]  Patient coming from: Home  I have personally briefly reviewed patient's old medical records in [place]  Chief Complaint: Poor oral intake, difficulty swallowing fluids  HPI: Keith Moreno is a 61 y.o. male with medical history significant for esophageal adenocarcinoma s/p esophagectomy with [name] 04/05/2022, HTN who presented to the cancer center with complaint of decreased oral intake, difficulty swallowing liquids, generalized weakness, and dizziness.  Patient with recent prolonged hospitalization (9/27-10/20) after elective esophagectomy performed on 04/05/2022 with placement of jejunostomy tube.  He was placed on J-tube feeding and on postop day #5 was started on dysphagia 1 diet after swallowing study showed no leak.  He was noted to have drainage around the G-tube site during hospitalization with foul-smelling discharge.  Wound cultures were obtained and grew abundant Klebsiella pneumoniae, abundant Proteus mirabilis, and rare Pediococcus pentosaceus. Blood cultures were negative. He was initially placed on Zosyn.  CT chest/abdomen/pelvis showed air and fluid collection anterior to the gastric body, pylorus, duodenal bulb.  This was felt to be in the region of the pyloroplasty and not consistent with abscess or perforation per CT surgery.  Moderate left-sided pleural effusion was seen and thoracentesis was performed yielding 1 L of serosanguineous fluid, cultures negative.  Patient was transitioned to oral ciprofloxacin to complete a 7-day course. J-tube since removed. He was discharged on 10/20 with home health.  He was seen in the cancer center today for evaluation of poor oral intake associated with difficulty swallowing liquids, generalized weakness, and dizziness.  Labs were obtained and were notable for corrected calcium 13.4.  Hemoglobin 9.2 which was  improved from prior.  Patient was given 1 L IV fluids in the cancer center.  Direct admission was arranged for management of hypercalcemia and dysphagia.  Patient states that over the last few days he has had difficulty swallowing even small amounts of liquids.  He feels fluid and air bubble like sensation in his upper esophagus after trying to swallow liquids.  He says he does eat small amount of solid foods such as a fried or scrambled egg or Chicken but has not had difficulty swallowing these.  He has been feeling generally weak, dehydrated, and getting lightheaded intermittently.  Review of Systems: All systems reviewed and are negative except as documented in history of present illness above.   Past Medical History:  Diagnosis Date   Anxiety    Cancer ([place])    Esophageal Cancer   COVID 2022   mild case   Dyspnea    r/t chemo and radiation   Headache    History of kidney stones    Hypertension    Kidney infection    at age 89   Pneumonia    61 years old   Pre-diabetes    PTSD (post-traumatic stress disorder)    per pt, if woken up suddenly he "cocks back arm" as if to punch but usually wakes up enough to come to before he hits anyone    Past Surgical History:  Procedure Laterality Date   BIOPSY  11/13/2021   Procedure: BIOPSY;  Surgeon: Jerene Bears, MD;  Location: [facility];  Service: Gastroenterology;;   BIOPSY  12/14/2021   Procedure: BIOPSY;  Surgeon: Irving Copas., MD;  Location: [facility];  Service: Gastroenterology;;   BRONCHIAL BRUSHINGS  12/14/2021   Procedure: BRONCHIAL BRUSHINGS;  Surgeon: Collene Gobble, MD;  Location:  [facility];  Service: Cardiopulmonary;;   BRONCHIAL NEEDLE ASPIRATION BIOPSY  12/14/2021   Procedure: BRONCHIAL NEEDLE ASPIRATION BIOPSIES;  Surgeon: Collene Gobble, MD;  Location: [facility];  Service: Cardiopulmonary;;   COLONOSCOPY  2018   CYST EXCISION  2022   left side of head   ESOPHAGECTOMY  04/05/2022    ESOPHAGOGASTRODUODENOSCOPY N/A 11/13/2021   Procedure: ESOPHAGOGASTRODUODENOSCOPY (EGD);  Surgeon: Jerene Bears, MD;  Location: [facility] ENDOSCOPY;  Service: Gastroenterology;  Laterality: N/A;   ESOPHAGOGASTRODUODENOSCOPY N/A 04/05/2022   Procedure: ESOPHAGOGASTRODUODENOSCOPY (EGD);  Surgeon: Lajuana Matte, MD;  Location: [facility] OR;  Service: Thoracic;  Laterality: N/A;   ESOPHAGOGASTRODUODENOSCOPY (EGD) WITH PROPOFOL N/A 12/14/2021   Procedure: ESOPHAGOGASTRODUODENOSCOPY (EGD) WITH PROPOFOL;  Surgeon: Irving Copas., MD;  Location: [facility];  Service: Gastroenterology;  Laterality: N/A;   EUS N/A 12/14/2021   Procedure: UPPER ENDOSCOPIC ULTRASOUND (EUS) LINEAR;  Surgeon: Irving Copas., MD;  Location: [place];  Service: Gastroenterology;  Laterality: N/A;   FINE NEEDLE ASPIRATION  12/14/2021   Procedure: FINE NEEDLE ASPIRATION (FNA) LINEAR;  Surgeon: Irving Copas., MD;  Location: [facility];  Service: Gastroenterology;;   FOREIGN BODY REMOVAL  11/13/2021   Procedure: FOREIGN BODY REMOVAL;  Surgeon: Jerene Bears, MD;  Location: [place];  Service: Gastroenterology;;   HUMERUS FRACTURE SURGERY Right    INTERCOSTAL NERVE BLOCK  04/05/2022   Procedure: INTERCOSTAL NERVE BLOCK;  Surgeon: Lajuana Matte, MD;  Location: [facility];  Service: Thoracic;;   IR IMAGING GUIDED PORT INSERTION  12/23/2021   IR THORACENTESIS ASP PLEURAL SPACE W/IMG GUIDE  04/19/2022   MULTIPLE TOOTH EXTRACTIONS  2015   NODE DISSECTION  04/05/2022   Procedure: NODE DISSECTION;  Surgeon: Lajuana Matte, MD;  Location: [place];  Service: Thoracic;;   TONSILLECTOMY AND ADENOIDECTOMY     as a child   VIDEO BRONCHOSCOPY WITH ENDOBRONCHIAL ULTRASOUND Bilateral 12/14/2021   Procedure: VIDEO BRONCHOSCOPY WITH ENDOBRONCHIAL ULTRASOUND;  Surgeon: Collene Gobble, MD;  Location: [place] [facility];  Service: Cardiopulmonary;  Laterality: Bilateral;    Social History:  reports that he quit  smoking about 40 years ago. His smoking use included cigars, cigarettes, and pipe. He quit smokeless tobacco use about 40 years ago.  His smokeless tobacco use included chew. He reports current alcohol use. He reports that he does not use drugs.  Allergies  Allergen Reactions   Procaine     Other reaction(s): Other, Respiratory Distress   Chocolate     Hyperactivity, respiratory distress   Other     Novocaine- respiratory distress    Family History  Problem Relation Age of Onset   Hypertension Mother    Diabetes Mother    Bipolar disorder Mother    Hypertension Father    Kidney cancer Sister    Colon cancer Paternal Uncle    Cancer Cousin    Bladder Cancer Cousin    Cancer Cousin      Prior to Admission medications   Medication Sig Start Date End Date Taking? Authorizing Provider  acetaminophen (TYLENOL) 325 MG tablet Take 2 tablets (650 mg total) by mouth every 6 (six) hours as needed for fever. 04/28/22   [name], PA-C  Alogliptin Benzoate 25 MG TABS Take 12.5 mg by mouth at bedtime. 09/30/21   [provider]  amLODipine (NORVASC) 10 MG tablet Take 10 mg by mouth at bedtime.    [provider]  Ascorbic Acid (VITAMIN C) 1000 MG tablet Take 2,000 mg by  mouth every morning.    [provider]  Cholecalciferol (VITAMIN D3) 50 MCG (2000 UT) capsule Take 20,000 Units by mouth at bedtime.    [provider]  CINNAMON PO Take 4,000 mg by mouth 2 (two) times daily.    [provider]  Cyanocobalamin (B-12 PO) Take 1 tablet by mouth daily.    [provider]  feeding supplement (ENSURE ENLIVE / ENSURE PLUS) LIQD Take 237 mLs by mouth 3 (three) times daily between meals. 04/28/22   Gold, Wayne E, PA-C  FERROUS SULFATE PO Take 1 tablet by mouth daily.    [provider]  lidocaine (LIDODERM) 5 % Place 1 patch onto the skin daily as needed (pain). 07/26/21   [provider]  lidocaine-prilocaine (EMLA) cream  Apply 1 application. topically as needed. 12/15/21   Lincoln Brigham, PA-C  lisinopril (PRINIVIL,ZESTRIL) 40 MG tablet Take 40 mg by mouth at bedtime.    [provider]  Melatonin 10 MG TABS Take 40 mg by mouth at bedtime as needed (Sleep).    [provider]  Multiple Vitamin (MULTIVITAMIN WITH MINERALS) TABS tablet Take 1 tablet by mouth daily.    [provider]  olopatadine (PATANOL) 0.1 % ophthalmic solution Place 1 drop into both eyes 2 (two) times daily. 10/28/21   [provider]  ondansetron (ZOFRAN-ODT) 8 MG disintegrating tablet Take 1 tablet (8 mg total) by mouth every 8 (eight) hours as needed for nausea or vomiting. 12/15/21   Lincoln Brigham, PA-C  pantoprazole (PROTONIX) 40 MG tablet Take 1 tablet (40 mg total) by mouth 2 (two) times daily. 04/28/22   Gold, Patrick Jupiter E, PA-C  pioglitazone (ACTOS) 30 MG tablet Take 30 mg by mouth every morning. 09/30/21   [provider]  vitamin E 180 MG (400 UNITS) capsule Take 1,200 Units by mouth 2 (two) times daily.    [provider]  Zinc Oxide (TRIPLE PASTE) 12.8 % ointment Apply topically 2 (two) times daily. To J-Tube site 04/28/22   [name], PA-C    Physical Exam: Vitals:   05/05/22 1839  BP: 133/85  Pulse: 86  Resp: 18  Temp: 98.7 F (37.1 C)  TempSrc: Oral  SpO2: 99%   Constitutional: Resting in bed, NAD, calm, comfortable Eyes: EOMI, lids and conjunctivae normal ENMT: Mucous membranes are moist. Posterior pharynx clear of any exudate or lesions.poor dentition.  Neck: normal, supple, no masses. Respiratory: clear to auscultation bilaterally, no wheezing, no crackles. Normal respiratory effort. No accessory muscle use.  Cardiovascular: Regular rate and rhythm, no murmurs / rubs / gallops. No extremity edema. 2+ pedal pulses. Abdomen: Obese abdomen, no tenderness, well-healed surgical incisions. Musculoskeletal: no clubbing / cyanosis. No joint deformity upper and lower  extremities. Good ROM, no contractures. Normal muscle tone.  Skin: no rashes, lesions, ulcers. No induration Neurologic: Sensation intact. Strength 5/5 in all 4.  Psychiatric:  Alert and oriented x 3. Normal mood.   EKG: Ordered and pending.  Assessment/Plan Principal Problem:   Hypercalcemia Active Problems:   Esophageal adenocarcinoma (HCC)   Nocturnal hypoxia   History of esophagectomy   Dysphagia   Hypertension   Keith Moreno is a 61 y.o. male with medical history significant for esophageal adenocarcinoma s/p esophagectomy with [name] 04/05/2022, HTN who is admitted with hypercalcemia.  Assessment and Plan: * Hypercalcemia Review of labs show that this has been a recurrent issue potentially related to his underlying malignancy but also likely worsened in setting of  dehydration from poor oral intake.  Intact PTH 03/31/2022 was 64.  Corrected calcium today is 13.4.  He is asymptomatic. -Start IV NS'@150'$  mL/hour x12 hours -Check PTH and follow calcium level -Obtain EKG, keep on telemetry  Esophageal adenocarcinoma (HCC) S/p esophagectomy with [name] 04/05/2022 Dysphagia Has been having difficulty swallowing liquids with air bubbles and feeling liquid stuck in his upper esophagus.  Able to tolerate small amount of solid food without similar symptoms. -Keep n.p.o. -Obtain swallow study -Continue IV fluid hydration overnight  Nocturnal hypoxia Noted on last hospitalization.  Patient also reports O2 drops to mid-80s when sleeping at home when using his home pulse ox.  Had recent left-sided pleural effusion requiring thoracentesis with 1 L fluid pulled off.  Also question of underlying sleep apnea. -Obtain CXR -Supplemental O2 qhs prn  Hypertension BP is stable.  Holding oral meds pending swallow study.  DVT prophylaxis: heparin injection 5,000 Units Start: 05/05/22 2200 Code Status: Full code, confirmed with patient on admission Family Communication: Spouse at  bedside Disposition Plan: From home and likely discharge to home pending clinical progress Consults called: None Severity of Illness: The appropriate patient status for this patient is INPATIENT. Inpatient status is judged to be reasonable and necessary in order to provide the required intensity of service to ensure the patient's safety. The patient's presenting symptoms, physical exam findings, and initial radiographic and laboratory data in the context of their chronic comorbidities is felt to place them at high risk for further clinical deterioration. Furthermore, it is not anticipated that the patient will be medically stable for discharge from the hospital within 2 midnights of admission.   * I certify that at the point of admission it is my clinical judgment that the patient will require inpatient hospital care spanning beyond 2 midnights from the point of admission due to high intensity of service, high risk for further deterioration and high frequency of surveillance required.Zada Finders MD [facility]  If 7PM-7AM, please contact night-coverage [email or web address]  05/05/2022, 7:45 PM

## 2022-05-05 NOTE — Hospital Course (Signed)
Keith Moreno is a 61 y.o. male with medical history significant for esophageal adenocarcinoma s/p esophagectomy with [name] 04/05/2022, HTN who is admitted with hypercalcemia.

## 2022-05-05 NOTE — Assessment & Plan Note (Addendum)
S/p esophagectomy with [name] 04/05/2022 Dysphagia Has been having difficulty swallowing liquids with air bubbles and feeling liquid stuck in his upper esophagus.  Able to tolerate small amount of solid food without similar symptoms. -Keep n.p.o. -Obtain swallow study -Continue IV fluid hydration overnight

## 2022-05-05 NOTE — Assessment & Plan Note (Signed)
Noted on last hospitalization.  Patient also reports O2 drops to mid-80s when sleeping at home when using his home pulse ox.  Had recent left-sided pleural effusion requiring thoracentesis with 1 L fluid pulled off.  Also question of underlying sleep apnea. -Obtain CXR -Supplemental O2 qhs prn

## 2022-05-05 NOTE — Assessment & Plan Note (Signed)
BP is stable.  Holding oral meds pending swallow study.

## 2022-05-05 NOTE — Assessment & Plan Note (Signed)
Review of labs show that this has been a recurrent issue potentially related to his underlying malignancy but also likely worsened in setting of dehydration from poor oral intake.  Intact PTH 03/31/2022 was 64.  Corrected calcium today is 13.4.  He is asymptomatic. -Start IV NS'@150'$  mL/hour x12 hours -Check PTH and follow calcium level -Obtain EKG, keep on telemetry

## 2022-05-06 LAB — CBC
HCT: 27.8 % — ABNORMAL LOW (ref 39.0–52.0)
Hemoglobin: 8.8 g/dL — ABNORMAL LOW (ref 13.0–17.0)
MCH: 28.7 pg (ref 26.0–34.0)
MCHC: 31.7 g/dL (ref 30.0–36.0)
MCV: 90.6 fL (ref 80.0–100.0)
Platelets: 264 10*3/uL (ref 150–400)
RBC: 3.07 MIL/uL — ABNORMAL LOW (ref 4.22–5.81)
RDW: 13.7 % (ref 11.5–15.5)
WBC: 4.1 10*3/uL (ref 4.0–10.5)
nRBC: 0 % (ref 0.0–0.2)

## 2022-05-06 LAB — COMPREHENSIVE METABOLIC PANEL
ALT: 20 U/L (ref 0–44)
AST: 17 U/L (ref 15–41)
Albumin: 2.3 g/dL — ABNORMAL LOW (ref 3.5–5.0)
Alkaline Phosphatase: 111 U/L (ref 38–126)
Anion gap: 8 (ref 5–15)
BUN: 20 mg/dL (ref 8–23)
CO2: 26 mmol/L (ref 22–32)
Calcium: 12.1 mg/dL — ABNORMAL HIGH (ref 8.9–10.3)
Chloride: 101 mmol/L (ref 98–111)
Creatinine, Ser: 1.22 mg/dL (ref 0.61–1.24)
GFR, Estimated: >60 mL/min (ref >60)
Glucose, Bld: 104 mg/dL — ABNORMAL HIGH (ref 70–99)
Potassium: 3.6 mmol/L (ref 3.5–5.1)
Sodium: 135 mmol/L (ref 135–145)
Total Bilirubin: 0.5 mg/dL (ref 0.3–1.2)
Total Protein: 5.6 g/dL — ABNORMAL LOW (ref 6.5–8.1)

## 2022-05-06 LAB — HIV ANTIBODY (ROUTINE TESTING W REFLEX): HIV Screen 4th Generation wRfx: NONREACTIVE

## 2022-05-06 NOTE — Evaluation (Signed)
Clinical/Bedside Swallow Evaluation Patient Details  Name: Keith Moreno MRN: [id number] Date of Birth: [birth year]  Today's Date: 05/06/2022 Time: SLP Start Time (ACUTE ONLY): 1134 SLP Stop Time (ACUTE ONLY): 1145 SLP Time Calculation (min) (ACUTE ONLY): 11 min  Past Medical History:  Past Medical History:  Diagnosis Date   Anxiety    Cancer ([place])    Esophageal Cancer   COVID 2022   mild case   Dyspnea    r/t chemo and radiation   Headache    History of kidney stones    Hypertension    Kidney infection    at age 38   Pneumonia    61 years old   Pre-diabetes    PTSD (post-traumatic stress disorder)    per pt, if woken up suddenly he "cocks back arm" as if to punch but usually wakes up enough to come to before he hits anyone   Past Surgical History:  Past Surgical History:  Procedure Laterality Date   BIOPSY  11/13/2021   Procedure: BIOPSY;  Surgeon: Jerene Bears, MD;  Location: [facility];  Service: Gastroenterology;;   BIOPSY  12/14/2021   Procedure: BIOPSY;  Surgeon: Irving Copas., MD;  Location: [place];  Service: Gastroenterology;;   BRONCHIAL BRUSHINGS  12/14/2021   Procedure: BRONCHIAL BRUSHINGS;  Surgeon: Collene Gobble, MD;  Location: [place];  Service: Cardiopulmonary;;   BRONCHIAL NEEDLE ASPIRATION BIOPSY  12/14/2021   Procedure: BRONCHIAL NEEDLE ASPIRATION BIOPSIES;  Surgeon: Collene Gobble, MD;  Location: [facility];  Service: Cardiopulmonary;;   COLONOSCOPY  2018   CYST EXCISION  2022   left side of head   ESOPHAGECTOMY  04/05/2022   ESOPHAGOGASTRODUODENOSCOPY N/A 11/13/2021   Procedure: ESOPHAGOGASTRODUODENOSCOPY (EGD);  Surgeon: Jerene Bears, MD;  Location: [facility];  Service: Gastroenterology;  Laterality: N/A;   ESOPHAGOGASTRODUODENOSCOPY N/A 04/05/2022   Procedure: ESOPHAGOGASTRODUODENOSCOPY (EGD);  Surgeon: Lajuana Matte, MD;  Location: [facility] OR;  Service: Thoracic;  Laterality: N/A;   ESOPHAGOGASTRODUODENOSCOPY  (EGD) WITH PROPOFOL N/A 12/14/2021   Procedure: ESOPHAGOGASTRODUODENOSCOPY (EGD) WITH PROPOFOL;  Surgeon: Irving Copas., MD;  Location: [place];  Service: Gastroenterology;  Laterality: N/A;   EUS N/A 12/14/2021   Procedure: UPPER ENDOSCOPIC ULTRASOUND (EUS) LINEAR;  Surgeon: Irving Copas., MD;  Location: [place];  Service: Gastroenterology;  Laterality: N/A;   FINE NEEDLE ASPIRATION  12/14/2021   Procedure: FINE NEEDLE ASPIRATION (FNA) LINEAR;  Surgeon: Irving Copas., MD;  Location: [place];  Service: Gastroenterology;;   FOREIGN BODY REMOVAL  11/13/2021   Procedure: FOREIGN BODY REMOVAL;  Surgeon: Jerene Bears, MD;  Location: [place];  Service: Gastroenterology;;   HUMERUS FRACTURE SURGERY Right    INTERCOSTAL NERVE BLOCK  04/05/2022   Procedure: INTERCOSTAL NERVE BLOCK;  Surgeon: Lajuana Matte, MD;  Location: [place];  Service: Thoracic;;   IR IMAGING GUIDED PORT INSERTION  12/23/2021   IR THORACENTESIS ASP PLEURAL SPACE W/IMG GUIDE  04/19/2022   MULTIPLE TOOTH EXTRACTIONS  2015   NODE DISSECTION  04/05/2022   Procedure: NODE DISSECTION;  Surgeon: Lajuana Matte, MD;  Location: [place];  Service: Thoracic;;   TONSILLECTOMY AND ADENOIDECTOMY     as a child   VIDEO BRONCHOSCOPY WITH ENDOBRONCHIAL ULTRASOUND Bilateral 12/14/2021   Procedure: VIDEO BRONCHOSCOPY WITH ENDOBRONCHIAL ULTRASOUND;  Surgeon: Collene Gobble, MD;  Location: [place];  Service: Cardiopulmonary;  Laterality: Bilateral;   HPI:  Keith Moreno is a 61 y.o. male who presented to the cancer center with  complaint of decreased oral intake, difficulty swallowing liquids, generalized weakness, and dizziness. Pt with recent esophageal adenocarcinoma s/p esophagectomy with [name] 04/05/2022 with J tube placed and subsequently removed during that admission (9/23-10/20).  He was seen in the cancer center for evaluation of poor oral intake associated with difficulty  swallowing liquids, generalized weakness, and dizziness. Direct admission was arranged for management of hypercalcemia and dysphagia.  Patient states that over the last few days he has had difficulty swallowing even small amounts of liquids.  He feels fluid and air bubble like sensation in his upper esophagus after trying to swallow liquids.  He says he does eat small amount of solid foods such as a fried or scrambled egg or Chicken but has not had difficulty swallowing these.  He has been feeling generally weak, dehydrated, and getting lightheaded intermittently. CXR 10/27: "Small left pleural effusion with atelectasis at the lung bases,  unchanged."  Pt with hx HTN.    Assessment / Plan / Recommendation  Clinical Impression  Pt presents with grossly normal swallow function as assessed clincially. Pt tolerated all consistencies trialed with no clinical s/s of aspiration.  Pt noted to grimace and stated he was experiencing some odynophagia.  Pt c/o aerophagia. And was noted to belch following 2 trials of water.  He states he is having more difficulty with liquids than with solids and that the aerophagia is leading to discomfort/pain/pressure and reduced PO intake.  Pt appears safe to resume a regular diet.  Pt would likely benefit from further assessment of esophagus, and does have orders for esophagram.  Spoke with MD.  SLP will also plan for MBSS early next week to assess oropharyngeal swallow function for possible contribution to aerophagia, given that this is one of pt's chief complaings.    Recommend regular texture diet with thin liquids.    Of note, pt's wife reports changes to speech since hospitalization; he speaks much more slowly and appears to be using increased efforts to find words.  SLP to follow with cognitive-linguistic assessment.  SLP Visit Diagnosis: Dysphagia, unspecified (R13.10)    Aspiration Risk  No limitations    Diet Recommendation Regular;Thin liquid   Liquid  Administration via: Cup;Straw Medication Administration:  (As tolerated) Supervision: Staff to assist with self feeding Compensations: Slow rate;Small sips/bites Postural Changes: Seated upright at 90 degrees;Remain upright for at least 30 minutes after po intake    Other  Recommendations Recommended Consults: Consider GI evaluation;Consider esophageal assessment Oral Care Recommendations: Oral care BID    Recommendations for follow up therapy are one component of a multi-disciplinary discharge planning process, led by the attending physician.  Recommendations may be updated based on patient status, additional functional criteria and insurance authorization.  Follow up Recommendations  (TBD)      Assistance Recommended at Discharge    Functional Status Assessment Patient has had a recent decline in their functional status and demonstrates the ability to make significant improvements in function in a reasonable and predictable amount of time.  Frequency and Duration min 2x/week  2 weeks       Prognosis Prognosis for Safe Diet Advancement: Good      Swallow Study   General HPI: Zhane Bluitt is a 61 y.o. male who presented to the cancer center with complaint of decreased oral intake, difficulty swallowing liquids, generalized weakness, and dizziness. Pt with recent esophageal adenocarcinoma s/p esophagectomy with [name] 04/05/2022 with J tube placed and subsequently removed during that admission (9/23-10/20).  He was  seen in the cancer center for evaluation of poor oral intake associated with difficulty swallowing liquids, generalized weakness, and dizziness. Direct admission was arranged for management of hypercalcemia and dysphagia.  Patient states that over the last few days he has had difficulty swallowing even small amounts of liquids.  He feels fluid and air bubble like sensation in his upper esophagus after trying to swallow liquids.  He says he does eat small amount of solid  foods such as a fried or scrambled egg or Chicken but has not had difficulty swallowing these.  He has been feeling generally weak, dehydrated, and getting lightheaded intermittently. CXR 10/27: "Small left pleural effusion with atelectasis at the lung bases,  unchanged."  Pt with hx HTN. Type of Study: Bedside Swallow Evaluation Previous Swallow Assessment: Barium swallow    Oral/Motor/Sensory Function Overall Oral Motor/Sensory Function: Within functional limits Facial ROM: Within Functional Limits Facial Symmetry: Within Functional Limits Lingual ROM: Within Functional Limits Lingual Symmetry: Within Functional Limits Lingual Strength: Within Functional Limits Velum: Within Functional Limits Mandible: Within Functional Limits   Ice Chips Ice chips: Not tested   Thin Liquid Thin Liquid: Within functional limits Other Comments: Belching    Nectar Thick Nectar Thick Liquid: Not tested   Honey Thick Honey Thick Liquid: Not tested   Puree Puree: Within functional limits   Solid     Solid: Within functional limits      Celedonio Savage, [place], [place] Office: [phone] 05/06/2022,12:15 PM

## 2022-05-06 NOTE — Progress Notes (Addendum)
      [address]       [place] [address]             [phone]           Subjective: Patient has had difficulty swallowing liquids, poor oral intake. He has had multiple bowel movements (not loose) over the last few days. He has occasional nausea but no vomiting. He denies abdominal pain.  Objective: Vital signs in last 24 hours: Temp:  [97.7 F (36.5 C)-98.7 F (37.1 C)] 98.3 F (36.8 C) (10/28 0746) Pulse Rate:  [80-96] 83 (10/28 0746) Cardiac Rhythm: Normal sinus rhythm;Bundle branch block;Other (Comment) (10/28 0258) Resp:  [16-20] 17 (10/28 0746) BP: (120-138)/(66-85) 134/83 (10/28 0746) SpO2:  [95 %-99 %] 98 % (10/28 0746) Weight:  [145.5 kg] 145.5 kg (10/27 1028)     Intake/Output from previous day: 10/27 0701 - 10/28 0700 In: 917.9 [I.V.:917.9] Out: -    Physical Exam:  Cardiovascular: RRR Pulmonary: Clear to auscultation bilaterally Abdomen: Soft, obese, non tender, bowel sounds present. J tube wound is clean, no purulence Extremities: Mild bilateral lower extremity edema. Wounds: Clean and dry.  No erythema or signs of infection.   Lab Results: CBC: Recent Labs    05/05/22 0957 05/06/22 0213  WBC 5.5 4.1  HGB 9.2* 8.8*  HCT 27.9* 27.8*  PLT 315 264   BMET:  Recent Labs    05/05/22 0957 05/06/22 0213  NA 135 135  K 3.8 3.6  CL 101 101  CO2 31 26  GLUCOSE 112* 104*  BUN 25* 20  CREATININE 1.13 1.22  CALCIUM 12.8* 12.1*    PT/INR: No results for input(s): "LABPROT", "INR" in the last 72 hours. ABG:  INR: Will add last result for INR, ABG once components are confirmed Will add last 4 CBG results once components are confirmed  Assessment/Plan:  1. CV - SR, first degree heart block. 2.  Pulmonary - On room air. 3. GI-Inadequate oral intake, difficulty swallowing liquids. NPO, IVF. Await Esophagram 4. Hypercalcemia-calcium this am slightly decreased from 12.8 to 12.1. Per medicine 5. Anemia-H and H this am 8.8  and 27.8 [name] will be available Monday 10/30.  Keith Budai M ZimmermanPA-C 05/06/2022,9:32 AM

## 2022-05-06 NOTE — Progress Notes (Signed)
PROGRESS NOTE    Keith Moreno  MEQ:[id number] DOB: [birth year] DOA: 05/05/2022 PCP: [facility]   Brief Narrative:  HPI: Keith Moreno is a 61 y.o. male with medical history significant for esophageal adenocarcinoma s/p esophagectomy with [name] 04/05/2022, HTN who presented to the cancer center with complaint of decreased oral intake, difficulty swallowing liquids, generalized weakness, and dizziness.   Patient with recent prolonged hospitalization (9/27-10/20) after elective esophagectomy performed on 04/05/2022 with placement of jejunostomy tube.  He was placed on J-tube feeding and on postop day #5 was started on dysphagia 1 diet after swallowing study showed no leak.  He was noted to have drainage around the G-tube site during hospitalization with foul-smelling discharge.  Wound cultures were obtained and grew abundant Klebsiella pneumoniae, abundant Proteus mirabilis, and rare Pediococcus pentosaceus. Blood cultures were negative. He was initially placed on Zosyn.  CT chest/abdomen/pelvis showed air and fluid collection anterior to the gastric body, pylorus, duodenal bulb.  This was felt to be in the region of the pyloroplasty and not consistent with abscess or perforation per CT surgery.  Moderate left-sided pleural effusion was seen and thoracentesis was performed yielding 1 L of serosanguineous fluid, cultures negative.  Patient was transitioned to oral ciprofloxacin to complete a 7-day course. J-tube since removed. He was discharged on 10/20 with home health.   He was seen in the cancer center today for evaluation of poor oral intake associated with difficulty swallowing liquids, generalized weakness, and dizziness.  Labs were obtained and were notable for corrected calcium 13.4.  Hemoglobin 9.2 which was improved from prior.  Patient was given 1 L IV fluids in the cancer center.  Direct admission was arranged for management of hypercalcemia and dysphagia.   Patient  states that over the last few days he has had difficulty swallowing even small amounts of liquids.  He feels fluid and air bubble like sensation in his upper esophagus after trying to swallow liquids.  He says he does eat small amount of solid foods such as a fried or scrambled egg or Chicken but has not had difficulty swallowing these.  He has been feeling generally weak, dehydrated, and getting lightheaded intermittently.  Assessment & Plan:   Principal Problem:   Hypercalcemia Active Problems:   Esophageal adenocarcinoma ([facility])   Nocturnal hypoxia   History of esophagectomy   Dysphagia   Hypertension  Hypercalcemia Review of labs show that this has been a recurrent issue potentially related to his underlying malignancy but also likely worsened in setting of dehydration from poor oral intake.  Intact PTH 03/31/2022 was 64.  Corrected calcium today is 13.4.  He is asymptomatic.  Calcium improving.  Continue IV fluids.  He is weak.  We will consult PT OT.  PTH in process.   Esophageal adenocarcinoma ([place]) S/p esophagectomy with [name] 04/05/2022 Dysphagia Has been having difficulty swallowing liquids with air bubbles and feeling liquid stuck in his upper esophagus.  Able to tolerate small amount of solid food without similar symptoms.  Discussed with CTS [name] and he recommends starting on full liquid diet.  I have also consulted SLP.  Esophagogram is ordered but I was informed by radiology that it cannot be done until Monday since this is nonemergent.   Nocturnal hypoxia/?  Apnea: Noted on last hospitalization.  Patient also reports O2 drops to mid-80s when sleeping at home when using his home pulse ox.  Had recent left-sided pleural effusion requiring thoracentesis with 1 L fluid pulled off.  Also question of underlying sleep apnea.  He has been advised to follow-up as outpatient for sleep study.   Hypertension BP is stable.  Holding oral meds at the moment.  DVT prophylaxis:  heparin injection 5,000 Units Start: 05/05/22 2200   Code Status: Full Code  Family Communication: Wife present at bedside.  Plan of care discussed with patient in length and he/she verbalized understanding and agreed with it.  Status is: Inpatient Remains inpatient appropriate because: Hypercalcemia, dysphagia.   Estimated body mass index is 43.49 kg/m as calculated from the following:   Height as of 04/05/22: 6' (1.829 m).   Weight as of an earlier encounter on 05/05/22: 145.5 kg.    Nutritional Assessment: There is no height or weight on file to calculate BMI.. Seen by dietician.  I agree with the assessment and plan as outlined below: Nutrition Status:        . Skin Assessment: I have examined the patient's skin and I agree with the wound assessment as performed by the wound care RN as outlined below:    Consultants:  Cardiothoracic surgery  Procedures:  None  Antimicrobials:  Anti-infectives (From admission, onward)    None         Subjective: Patient seen and examined.  He feels better.  Wife at the bedside.  He wants to eat.  Requesting to put him on some sort of diet.  Objective: Vitals:   05/05/22 2006 05/06/22 0004 05/06/22 0305 05/06/22 0746  BP: 136/77 131/67 120/66 134/83  Pulse: 80 91 94 83  Resp: '18 19 20 17  '$ Temp: 98.7 F (37.1 C) 98.1 F (36.7 C) 97.7 F (36.5 C) 98.3 F (36.8 C)  TempSrc: Oral Oral Oral Oral  SpO2: 95% 96% 98% 98%    Intake/Output Summary (Last 24 hours) at 05/06/2022 1113 Last data filed at 05/06/2022 0747 Gross per 24 hour  Intake 917.93 ml  Output --  Net 917.93 ml   There were no vitals filed for this visit.  Examination:  General exam: Appears calm and comfortable  Respiratory system: Clear to auscultation. Respiratory effort normal. Cardiovascular system: S1 & S2 heard, RRR. No JVD, murmurs, rubs, gallops or clicks. No pedal edema. Gastrointestinal system: Abdomen is nondistended, soft and nontender. No  organomegaly or masses felt. Normal bowel sounds heard. Central nervous system: Alert and oriented. No focal neurological deficits. Extremities: Symmetric 5 x 5 power. Skin: No rashes, lesions or ulcers Psychiatry: Judgement and insight appear normal. Mood & affect appropriate.    Data Reviewed: I have personally reviewed following labs and imaging studies  CBC: Recent Labs  Lab 05/05/22 0957 05/06/22 0213  WBC 5.5 4.1  NEUTROABS 4.6  --   HGB 9.2* 8.8*  HCT 27.9* 27.8*  MCV 88.0 90.6  PLT 315 646   Basic Metabolic Panel: Recent Labs  Lab 05/05/22 0957 05/06/22 0213  NA 135 135  K 3.8 3.6  CL 101 101  CO2 31 26  GLUCOSE 112* 104*  BUN 25* 20  CREATININE 1.13 1.22  CALCIUM 12.8* 12.1*   GFR: Estimated Creatinine Clearance: 94.3 mL/min (by C-G formula based on SCr of 1.22 mg/dL). Liver Function Tests: Recent Labs  Lab 05/05/22 0957 05/06/22 0213  AST 17 17  ALT 18 20  ALKPHOS 129* 111  BILITOT 0.5 0.5  PROT 6.6 5.6*  ALBUMIN 3.2* 2.3*   No results for input(s): "LIPASE", "AMYLASE" in the last 168 hours. No results for input(s): "AMMONIA" in the last 168 hours. Coagulation  Profile: No results for input(s): "INR", "PROTIME" in the last 168 hours. Cardiac Enzymes: No results for input(s): "CKTOTAL", "CKMB", "CKMBINDEX", "TROPONINI" in the last 168 hours. BNP (last 3 results) No results for input(s): "PROBNP" in the last 8760 hours. HbA1C: No results for input(s): "HGBA1C" in the last 72 hours. CBG: Recent Labs  Lab 05/05/22 2007  GLUCAP 96   Lipid Profile: No results for input(s): "CHOL", "HDL", "LDLCALC", "TRIG", "CHOLHDL", "LDLDIRECT" in the last 72 hours. Thyroid Function Tests: No results for input(s): "TSH", "T4TOTAL", "FREET4", "T3FREE", "THYROIDAB" in the last 72 hours. Anemia Panel: No results for input(s): "VITAMINB12", "FOLATE", "FERRITIN", "TIBC", "IRON", "RETICCTPCT" in the last 72 hours. Sepsis Labs: No results for input(s):  "PROCALCITON", "LATICACIDVEN" in the last 168 hours.  No results found for this or any previous visit (from the past 240 hour(s)).   Radiology Studies: X-ray chest PA and lateral  Result Date: 05/05/2022 CLINICAL DATA:  History of esophagectomy. EXAM: CHEST - 2 VIEW COMPARISON:  04/27/2022. FINDINGS: The heart size and mediastinal contours are stable. A perihilar opacities noted on the right measuring 11.7 x 8.3 cm, not significantly changed from the prior exam. Lung volumes are low and mild atelectasis is noted at the lung bases. Small left pleural effusion. No pneumothorax. A stable right chest port is noted. Degenerative changes are present in the thoracic spine. IMPRESSION: 1. Small left pleural effusion with atelectasis at the lung bases, unchanged. 2. Remaining findings are unchanged. Electronically Signed   By: Brett Fairy M.D.   On: 05/05/2022 23:12    Scheduled Meds:  Chlorhexidine Gluconate Cloth  6 each Topical Daily   heparin  5,000 Units Subcutaneous Q8H   sodium chloride flush  10-40 mL Intracatheter Q12H   sodium chloride flush  3 mL Intravenous Q12H   Continuous Infusions:   LOS: 1 day   Darliss Cheney, MD [facility]  05/06/2022, 11:13 AM   *Please note that this is a verbal dictation therefore any spelling or grammatical errors are due to the "[employer]" system interpretation.  Please page via [place] and do not message via secure chat for urgent patient care matters. Secure chat can be used for non urgent patient care matters.  How to contact the [facility] Attending or Consulting provider [place] or covering provider during after hours [place], for this patient?  Check the care team in [facility] and look for a) attending/consulting [facility] provider listed and b) the [facility] team listed. Page or secure chat [phone]. Log into [email or web address] and use [place]'s universal password to access. If you do not have the password, please contact the hospital operator. Locate the [facility]  provider you are looking for under [facility] and page to a number that you can be directly reached. If you still have difficulty reaching the provider, please page the [facility] (Director on Call) for the Hospitalists listed on amion for assistance.

## 2022-05-07 LAB — GLUCOSE, CAPILLARY: Glucose-Capillary: 132 mg/dL — ABNORMAL HIGH (ref 70–99)

## 2022-05-07 LAB — COMPREHENSIVE METABOLIC PANEL
ALT: 17 U/L (ref 0–44)
AST: 16 U/L (ref 15–41)
Albumin: 2.2 g/dL — ABNORMAL LOW (ref 3.5–5.0)
Alkaline Phosphatase: 90 U/L (ref 38–126)
Anion gap: 7 (ref 5–15)
BUN: 15 mg/dL (ref 8–23)
CO2: 25 mmol/L (ref 22–32)
Calcium: 12.1 mg/dL — ABNORMAL HIGH (ref 8.9–10.3)
Chloride: 107 mmol/L (ref 98–111)
Creatinine, Ser: 1 mg/dL (ref 0.61–1.24)
GFR, Estimated: >60 mL/min (ref >60)
Glucose, Bld: 101 mg/dL — ABNORMAL HIGH (ref 70–99)
Potassium: 3.5 mmol/L (ref 3.5–5.1)
Sodium: 139 mmol/L (ref 135–145)
Total Bilirubin: 0.5 mg/dL (ref 0.3–1.2)
Total Protein: 5.3 g/dL — ABNORMAL LOW (ref 6.5–8.1)

## 2022-05-07 MED ORDER — SODIUM CHLORIDE 0.9 % IV SOLN: INTRAVENOUS | Status: DC

## 2022-05-07 NOTE — Progress Notes (Signed)
PROGRESS NOTE    Keith Moreno  ZOX:[id number] DOB: 1960/11/25 DOA: 05/05/2022 PCP: [facility]   Brief Narrative:  HPI: Keith Moreno is a 61 y.o. male with medical history significant for esophageal adenocarcinoma s/p esophagectomy with [name] 04/05/2022, HTN who presented to the cancer center with complaint of decreased oral intake, difficulty swallowing liquids, generalized weakness, and dizziness.   Patient with recent prolonged hospitalization (9/27-10/20) after elective esophagectomy performed on 04/05/2022 with placement of jejunostomy tube.  He was placed on J-tube feeding and on postop day #5 was started on dysphagia 1 diet after swallowing study showed no leak.  He was noted to have drainage around the G-tube site during hospitalization with foul-smelling discharge.  Wound cultures were obtained and grew abundant Klebsiella pneumoniae, abundant Proteus mirabilis, and rare Pediococcus pentosaceus. Blood cultures were negative. He was initially placed on Zosyn.  CT chest/abdomen/pelvis showed air and fluid collection anterior to the gastric body, pylorus, duodenal bulb.  This was felt to be in the region of the pyloroplasty and not consistent with abscess or perforation per CT surgery.  Moderate left-sided pleural effusion was seen and thoracentesis was performed yielding 1 L of serosanguineous fluid, cultures negative.  Patient was transitioned to oral ciprofloxacin to complete a 7-day course. J-tube since removed. He was discharged on 10/20 with home health.   He was seen in the cancer center today for evaluation of poor oral intake associated with difficulty swallowing liquids, generalized weakness, and dizziness.  Labs were obtained and were notable for corrected calcium 13.4.  Hemoglobin 9.2 which was improved from prior.  Patient was given 1 L IV fluids in the cancer center.  Direct admission was arranged for management of hypercalcemia and dysphagia.   Patient  states that over the last few days he has had difficulty swallowing even small amounts of liquids.  He feels fluid and air bubble like sensation in his upper esophagus after trying to swallow liquids.  He says he does eat small amount of solid foods such as a fried or scrambled egg or Chicken but has not had difficulty swallowing these.  He has been feeling generally weak, dehydrated, and getting lightheaded intermittently.  Assessment & Plan:   Principal Problem:   Hypercalcemia Active Problems:   Esophageal adenocarcinoma (HCC)   Nocturnal hypoxia   History of esophagectomy   Dysphagia   Hypertension  Hypercalcemia Review of labs show that this has been a recurrent issue potentially related to his underlying malignancy but also likely worsened in setting of dehydration from poor oral intake.  Intact PTH 03/31/2022 was 64.  Corrected calcium today is 13.4.  He is asymptomatic.  PTH normal.  Calcium stable.  Resume IV fluids.   Esophageal adenocarcinoma ([place]) S/p esophagectomy with [name] 04/05/2022 Dysphagia Has been having difficulty swallowing liquids with air bubbles and feeling liquid stuck in his upper esophagus.  Able to tolerate small amount of solid food without similar symptoms.  Discussed with CTS [name] and he recommends starting on full liquid diet.  I have also consulted SLP.  Esophagogram is ordered but I was informed by radiology that it cannot be done until Monday since this is nonemergent.  Also SLP following and MBS is also scheduled for Monday.   Nocturnal hypoxia/?  Apnea: Noted on last hospitalization.  Patient also reports O2 drops to mid-80s when sleeping at home when using his home pulse ox.  Had recent left-sided pleural effusion requiring thoracentesis with 1 L fluid pulled off.  Also question of underlying sleep apnea.  He has been advised to follow-up as outpatient for sleep study.   Hypertension BP is stable.  Holding oral meds at the moment.  DVT  prophylaxis: heparin injection 5,000 Units Start: 05/05/22 2200   Code Status: Full Code  Family Communication: Wife present at bedside.  Plan of care discussed with patient in length and he/she verbalized understanding and agreed with it.  Status is: Inpatient Remains inpatient appropriate because: Hypercalcemia, dysphagia.   Estimated body mass index is 43.49 kg/m as calculated from the following:   Height as of 04/05/22: 6' (1.829 m).   Weight as of an earlier encounter on 05/05/22: 145.5 kg.    Nutritional Assessment: There is no height or weight on file to calculate BMI.. Seen by dietician.  I agree with the assessment and plan as outlined below: Nutrition Status:        . Skin Assessment: I have examined the patient's skin and I agree with the wound assessment as performed by the wound care RN as outlined below:    Consultants:  Cardiothoracic surgery  Procedures:  None  Antimicrobials:  Anti-infectives (From admission, onward)    None         Subjective:  Patient seen and examined.  Wife at the bedside.  Patient states that he is feeling better, swallowing is better as well.  No new complaint.  Objective: Vitals:   05/06/22 1659 05/06/22 2148 05/07/22 0622 05/07/22 0911  BP: 139/82 130/78 130/76 (!) 144/79  Pulse: (!) 102 85 84 85  Resp: '17 19 18 17  '$ Temp: 98.3 F (36.8 C) 97.6 F (36.4 C) 97.8 F (36.6 C) 98.4 F (36.9 C)  TempSrc: Oral Oral Oral Oral  SpO2: 98% 99% 98% 96%    Intake/Output Summary (Last 24 hours) at 05/07/2022 1056 Last data filed at 05/07/2022 7591 Gross per 24 hour  Intake 840 ml  Output 603 ml  Net 237 ml    There were no vitals filed for this visit.  Examination:  General exam: Appears calm and comfortable  Respiratory system: Clear to auscultation. Respiratory effort normal. Cardiovascular system: S1 & S2 heard, RRR. No JVD, murmurs, rubs, gallops or clicks. No pedal edema. Gastrointestinal system: Abdomen is  nondistended, soft and nontender. No organomegaly or masses felt. Normal bowel sounds heard. Central nervous system: Alert and oriented. No focal neurological deficits. Extremities: Symmetric 5 x 5 power. Skin: No rashes, lesions or ulcers.  Psychiatry: Judgement and insight appear normal. Mood & affect appropriate.    Data Reviewed: I have personally reviewed following labs and imaging studies  CBC: Recent Labs  Lab 05/05/22 0957 05/06/22 0213  WBC 5.5 4.1  NEUTROABS 4.6  --   HGB 9.2* 8.8*  HCT 27.9* 27.8*  MCV 88.0 90.6  PLT 315 638    Basic Metabolic Panel: Recent Labs  Lab 05/05/22 0957 05/06/22 0213 05/07/22 0306  NA 135 135 139  K 3.8 3.6 3.5  CL 101 101 107  CO2 '31 26 25  '$ GLUCOSE 112* 104* 101*  BUN 25* 20 15  CREATININE 1.13 1.22 1.00  CALCIUM 12.8* 12.1* 12.1*    GFR: Estimated Creatinine Clearance: 115 mL/min (by C-G formula based on SCr of 1 mg/dL). Liver Function Tests: Recent Labs  Lab 05/05/22 0957 05/06/22 0213 05/07/22 0306  AST '17 17 16  '$ ALT '18 20 17  '$ ALKPHOS 129* 111 90  BILITOT 0.5 0.5 0.5  PROT 6.6 5.6* 5.3*  ALBUMIN 3.2* 2.3* 2.2*  No results for input(s): "LIPASE", "AMYLASE" in the last 168 hours. No results for input(s): "AMMONIA" in the last 168 hours. Coagulation Profile: No results for input(s): "INR", "PROTIME" in the last 168 hours. Cardiac Enzymes: No results for input(s): "CKTOTAL", "CKMB", "CKMBINDEX", "TROPONINI" in the last 168 hours. BNP (last 3 results) No results for input(s): "PROBNP" in the last 8760 hours. HbA1C: No results for input(s): "HGBA1C" in the last 72 hours. CBG: Recent Labs  Lab 05/05/22 2007  GLUCAP 96    Lipid Profile: No results for input(s): "CHOL", "HDL", "LDLCALC", "TRIG", "CHOLHDL", "LDLDIRECT" in the last 72 hours. Thyroid Function Tests: No results for input(s): "TSH", "T4TOTAL", "FREET4", "T3FREE", "THYROIDAB" in the last 72 hours. Anemia Panel: No results for input(s):  "VITAMINB12", "FOLATE", "FERRITIN", "TIBC", "IRON", "RETICCTPCT" in the last 72 hours. Sepsis Labs: No results for input(s): "PROCALCITON", "LATICACIDVEN" in the last 168 hours.  No results found for this or any previous visit (from the past 240 hour(s)).   Radiology Studies: X-ray chest PA and lateral  Result Date: 05/05/2022 CLINICAL DATA:  History of esophagectomy. EXAM: CHEST - 2 VIEW COMPARISON:  04/27/2022. FINDINGS: The heart size and mediastinal contours are stable. A perihilar opacities noted on the right measuring 11.7 x 8.3 cm, not significantly changed from the prior exam. Lung volumes are low and mild atelectasis is noted at the lung bases. Small left pleural effusion. No pneumothorax. A stable right chest port is noted. Degenerative changes are present in the thoracic spine. IMPRESSION: 1. Small left pleural effusion with atelectasis at the lung bases, unchanged. 2. Remaining findings are unchanged. Electronically Signed   By: Brett Fairy M.D.   On: 05/05/2022 23:12    Scheduled Meds:  Chlorhexidine Gluconate Cloth  6 each Topical Daily   heparin  5,000 Units Subcutaneous Q8H   sodium chloride flush  10-40 mL Intracatheter Q12H   sodium chloride flush  3 mL Intravenous Q12H   Continuous Infusions:  sodium chloride 75 mL/hr at 05/07/22 0855     LOS: 2 days   Darliss Cheney, MD [facility]  05/07/2022, 10:56 AM   *Please note that this is a verbal dictation therefore any spelling or grammatical errors are due to the "[employer]" system interpretation.  Please page via [place] and do not message via secure chat for urgent patient care matters. Secure chat can be used for non urgent patient care matters.  How to contact the [facility] Attending or Consulting provider [place] or covering provider during after hours [place], for this patient?  Check the care team in [facility] and look for a) attending/consulting TRH provider listed and b) the [facility] team listed. Page or secure  chat [phone]-7P. Log into [email or web address] and use [place]'s universal password to access. If you do not have the password, please contact the hospital operator. Locate the [facility] provider you are looking for under [facility] and page to a number that you can be directly reached. If you still have difficulty reaching the provider, please page the  [facility] (Director on Call) for the Hospitalists listed on amion for assistance.

## 2022-05-08 ENCOUNTER — Inpatient Hospital Stay (HOSPITAL_COMMUNITY): Payer: No Typology Code available for payment source

## 2022-05-08 LAB — COMPREHENSIVE METABOLIC PANEL
ALT: 16 U/L (ref 0–44)
AST: 15 U/L (ref 15–41)
Albumin: 2.3 g/dL — ABNORMAL LOW (ref 3.5–5.0)
Alkaline Phosphatase: 96 U/L (ref 38–126)
Anion gap: 4 — ABNORMAL LOW (ref 5–15)
BUN: 14 mg/dL (ref 8–23)
CO2: 28 mmol/L (ref 22–32)
Calcium: 12.3 mg/dL — ABNORMAL HIGH (ref 8.9–10.3)
Chloride: 108 mmol/L (ref 98–111)
Creatinine, Ser: 1.1 mg/dL (ref 0.61–1.24)
GFR, Estimated: >60 mL/min (ref >60)
Glucose, Bld: 114 mg/dL — ABNORMAL HIGH (ref 70–99)
Potassium: 3.5 mmol/L (ref 3.5–5.1)
Sodium: 140 mmol/L (ref 135–145)
Total Bilirubin: 0.4 mg/dL (ref 0.3–1.2)
Total Protein: 5.5 g/dL — ABNORMAL LOW (ref 6.5–8.1)

## 2022-05-08 LAB — PTH, INTACT AND CALCIUM
Calcium, Total (PTH): 12.3 mg/dL — ABNORMAL HIGH (ref 8.6–10.2)
PTH: 62 pg/mL (ref 15–65)

## 2022-05-08 MED ORDER — POLYETHYLENE GLYCOL 3350 17 G PO PACK: 17.0000 g | PACK | Freq: Every day | ORAL | Status: DC

## 2022-05-08 NOTE — Progress Notes (Signed)
SLP Cancellation Note  Patient Details Name: Keith Moreno MRN: [id number] DOB: [birth year]   Cancelled treatment:       Reason Eval/Treat Not Completed: Patient not medically ready. Radiology reports pt will need to be cleared by MD/surgeon prior to [place]. Await esophagram findings.    Danylle Ouk, Katherene Ponto 05/08/2022, 11:01 AM

## 2022-05-08 NOTE — TOC Initial Note (Addendum)
Transition of Care [facility]) - Initial/Assessment Note    Patient Details  Name: Keith Moreno MRN: [id number] Date of Birth: 08/03/1960  Transition of [facility]) CM/SW Contact:    Keith Favre, RN Phone Number: 05/08/2022, 1:47 PM  Clinical Narrative:                 Spoke to patient and significant other at bedisde.   Patient recently discharged with [facility] RN,PT,OT, and would like to continue services with them. Confirmed with Keith Moreno with [facility].   Patient received walker and rollator from [place] but has not received 3 in 1. [facility]   PCP Keith Moreno fax [phone] . Faxed clinicals along with another order for 3 in 1 with ht and wt .   Emailed Urgent Items for Discharge another order for 3 in 1 also.   [place] Called and left message with NCM direct phone number   Expected Discharge Plan: Keith Moreno to Discharge: Continued Medical Work up   Patient Goals and CMS Choice Patient states their goals for this hospitalization and ongoing recovery are:: to return to home CMS Medicare.gov Compare Post Acute Care list provided to:: Patient Choice offered to / list presented to : Patient  Expected Discharge Plan and Services Expected Discharge Plan: [place]   Discharge Planning Services: CM Consult Post Acute Care Choice: [place] arrangements for the past 2 months: Single Family Home                 DME Arranged:  (see note)         HH Arranged: RN, PT, OT HH Agency: [place] Date [facility] Agency Contacted: 05/08/22 Time HH Agency Contacted: 54 Representative spoke with at [place]: cory  Prior Living Arrangements/Services Living arrangements for the past 2 months: [place] with:: Significant Other Patient language and need for interpreter reviewed:: Yes Do you feel safe going back to the place where you live?: Yes       Need for Family Participation in Patient Care: Yes (Comment) Care giver support system in place?: Yes (comment) Current home services: DME Criminal Activity/Legal Involvement Pertinent to Current Situation/Hospitalization: No - Comment as needed  Activities of Daily Living      Permission Sought/Granted   Permission granted to share information with : Yes, Verbal Permission Granted  Share Information with NAME: Significant other Keith Moreno  Permission granted to share info w AGENCY: Keith Moreno, [facility]        Emotional Assessment Appearance:: Appears stated age Attitude/Demeanor/Rapport: Engaged Affect (typically observed): Accepting Orientation: : Oriented to Self, Oriented to Place, Oriented to  Time, Oriented to Situation Alcohol / Substance Use: Not Applicable Psych Involvement: No (comment)  Admission diagnosis:  Hypercalcemia [E83.52] Patient Active Problem List   Diagnosis Date Noted   Hypercalcemia 05/05/2022   History of esophagectomy 05/05/2022   Dysphagia 05/05/2022   Hypertension    Nocturnal hypoxia    Malnutrition of moderate degree 04/06/2022   Esophageal cancer ([place]) 04/05/2022   Port-A-Cath in place 12/26/2021   Esophageal adenocarcinoma ([place]) 12/12/2021   Mediastinal adenopathy 12/08/2021   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

## 2022-05-08 NOTE — Progress Notes (Signed)
Pt arrived from ..6N..., A/ox .Marland Kitchen4.pt denies any pain, MD aware,CCMD called. CHG bath given,no further needs at this time

## 2022-05-08 NOTE — Plan of Care (Signed)
  Problem: Education: Goal: Knowledge of General Education information will improve Description Including pain rating scale, medication(s)/side effects and non-pharmacologic comfort measures Outcome: Progressing   Problem: Health Behavior/Discharge Planning: Goal: Ability to manage health-related needs will improve Outcome: Progressing   

## 2022-05-08 NOTE — Progress Notes (Signed)
PROGRESS NOTE    Keith Moreno  TGY:[id number] DOB: [birth year] DOA: 05/05/2022 PCP: [facility]   Brief Narrative:  HPI: Keith Moreno is a 61 y.o. male with medical history significant for esophageal adenocarcinoma s/p esophagectomy with [name] 04/05/2022, HTN who presented to the cancer center with complaint of decreased oral intake, difficulty swallowing liquids, generalized weakness, and dizziness.   Patient with recent prolonged hospitalization (9/27-10/20) after elective esophagectomy performed on 04/05/2022 with placement of jejunostomy tube.  He was placed on J-tube feeding and on postop day #5 was started on dysphagia 1 diet after swallowing study showed no leak.  He was noted to have drainage around the G-tube site during hospitalization with foul-smelling discharge.  Wound cultures were obtained and grew abundant Klebsiella pneumoniae, abundant Proteus mirabilis, and rare Pediococcus pentosaceus. Blood cultures were negative. He was initially placed on Zosyn.  CT chest/abdomen/pelvis showed air and fluid collection anterior to the gastric body, pylorus, duodenal bulb.  This was felt to be in the region of the pyloroplasty and not consistent with abscess or perforation per CT surgery.  Moderate left-sided pleural effusion was seen and thoracentesis was performed yielding 1 L of serosanguineous fluid, cultures negative.  Patient was transitioned to oral ciprofloxacin to complete a 7-day course. J-tube since removed. He was discharged on 10/20 with home health.   He was seen in the cancer center today for evaluation of poor oral intake associated with difficulty swallowing liquids, generalized weakness, and dizziness.  Labs were obtained and were notable for corrected calcium 13.4.  Hemoglobin 9.2 which was improved from prior.  Patient was given 1 L IV fluids in the cancer center.  Direct admission was arranged for management of hypercalcemia and dysphagia.   Patient  states that over the last few days he has had difficulty swallowing even small amounts of liquids.  He feels fluid and air bubble like sensation in his upper esophagus after trying to swallow liquids.  He says he does eat small amount of solid foods such as a fried or scrambled egg or Chicken but has not had difficulty swallowing these.  He has been feeling generally weak, dehydrated, and getting lightheaded intermittently.  Assessment & Plan:   Principal Problem:   Hypercalcemia Active Problems:   Esophageal adenocarcinoma (HCC)   Nocturnal hypoxia   History of esophagectomy   Dysphagia   Hypertension  Hypercalcemia Review of labs show that this has been a recurrent issue potentially related to his underlying malignancy but also likely worsened in setting of dehydration from poor oral intake.  Intact PTH 03/31/2022 was 64.  Corrected calcium today is 13.4.  He is asymptomatic.  PTH normal.  Calcium stable.  Continue IV fluids.   Esophageal adenocarcinoma ([place]) S/p esophagectomy with [name] 04/05/2022 Dysphagia Has been having difficulty swallowing liquids with air bubbles and feeling liquid stuck in his upper esophagus.  Able to tolerate small amount of solid food without similar symptoms.  Discussed with CTS [name] and he recommends starting on full liquid diet.  When he was seen by SLP, since he was having more issues with liquids and less with disorders, they recommended starting solids he was started on regular diet.  Esophagram is completed and there is some suspicion of leak.  MBS is also pending.  SLP following.  We are waiting for further evaluation and recommendation by cardiothoracic surgery.   Nocturnal hypoxia/?  Apnea: Noted on last hospitalization.  Patient also reports O2 drops to mid-80s when sleeping at  home when using his home pulse ox.  Had recent left-sided pleural effusion requiring thoracentesis with 1 L fluid pulled off.  Also question of underlying sleep  apnea.  He has been advised to follow-up as outpatient for sleep study.   Hypertension BP is stable.  Holding oral meds at the moment.  DVT prophylaxis: heparin injection 5,000 Units Start: 05/05/22 2200   Code Status: Full Code  Family Communication: Wife present at bedside.  Plan of care discussed with patient in length and he/she verbalized understanding and agreed with it.  Status is: Inpatient Remains inpatient appropriate because: Hypercalcemia, dysphagia.   Estimated body mass index is 43.49 kg/m as calculated from the following:   Height as of 04/05/22: 6' (1.829 m).   Weight as of an earlier encounter on 05/05/22: 145.5 kg.    Nutritional Assessment: There is no height or weight on file to calculate BMI.. Seen by dietician.  I agree with the assessment and plan as outlined below: Nutrition Status:        . Skin Assessment: I have examined the patient's skin and I agree with the wound assessment as performed by the wound care RN as outlined below:    Consultants:  Cardiothoracic surgery  Procedures:  None  Antimicrobials:  Anti-infectives (From admission, onward)    None         Subjective:  Seen and examined.  No new complaint.  Objective: Vitals:   05/07/22 1637 05/07/22 2208 05/08/22 0541 05/08/22 0803  BP: 117/69 (!) 140/72 (!) 142/87 (!) 147/85  Pulse: 96 82 85 79  Resp: '18 18 18 18  '$ Temp: 99.3 F (37.4 C) 98.1 F (36.7 C) 98.4 F (36.9 C) 98.2 F (36.8 C)  TempSrc: Oral Oral Oral Oral  SpO2: 99% 100% 99% 98%    Intake/Output Summary (Last 24 hours) at 05/08/2022 1246 Last data filed at 05/08/2022 0700 Gross per 24 hour  Intake 1879.79 ml  Output --  Net 1879.79 ml    There were no vitals filed for this visit.  Examination:  General exam: Appears calm and comfortable  Respiratory system: Clear to auscultation. Respiratory effort normal. Cardiovascular system: S1 & S2 heard, RRR. No JVD, murmurs, rubs, gallops or clicks. No  pedal edema. Gastrointestinal system: Abdomen is nondistended, soft and nontender. No organomegaly or masses felt. Normal bowel sounds heard. Central nervous system: Alert and oriented. No focal neurological deficits. Extremities: Symmetric 5 x 5 power. Skin: No rashes, lesions or ulcers.  Psychiatry: Judgement and insight appear normal. Mood & affect appropriate.    Data Reviewed: I have personally reviewed following labs and imaging studies  CBC: Recent Labs  Lab 05/05/22 0957 05/06/22 0213  WBC 5.5 4.1  NEUTROABS 4.6  --   HGB 9.2* 8.8*  HCT 27.9* 27.8*  MCV 88.0 90.6  PLT 315 161    Basic Metabolic Panel: Recent Labs  Lab 05/05/22 0957 05/05/22 2157 05/06/22 0213 05/07/22 0306 05/08/22 0235  NA 135  --  135 139 140  K 3.8  --  3.6 3.5 3.5  CL 101  --  101 107 108  CO2 31  --  '26 25 28  '$ GLUCOSE 112*  --  104* 101* 114*  BUN 25*  --  '20 15 14  '$ CREATININE 1.13  --  1.22 1.00 1.10  CALCIUM 12.8* 12.3* 12.1* 12.1* 12.3*    GFR: Estimated Creatinine Clearance: 104.5 mL/min (by C-G formula based on SCr of 1.1 mg/dL). Liver Function Tests: Recent Labs  Lab 05/05/22 0957 05/06/22 0213 05/07/22 0306 05/08/22 0235  AST '17 17 16 15  '$ ALT '18 20 17 16  '$ ALKPHOS 129* 111 90 96  BILITOT 0.5 0.5 0.5 0.4  PROT 6.6 5.6* 5.3* 5.5*  ALBUMIN 3.2* 2.3* 2.2* 2.3*    No results for input(s): "LIPASE", "AMYLASE" in the last 168 hours. No results for input(s): "AMMONIA" in the last 168 hours. Coagulation Profile: No results for input(s): "INR", "PROTIME" in the last 168 hours. Cardiac Enzymes: No results for input(s): "CKTOTAL", "CKMB", "CKMBINDEX", "TROPONINI" in the last 168 hours. BNP (last 3 results) No results for input(s): "PROBNP" in the last 8760 hours. HbA1C: No results for input(s): "HGBA1C" in the last 72 hours. CBG: Recent Labs  Lab 05/05/22 2007 05/07/22 1130  GLUCAP 96 132*    Lipid Profile: No results for input(s): "CHOL", "HDL", "LDLCALC", "TRIG",  "CHOLHDL", "LDLDIRECT" in the last 72 hours. Thyroid Function Tests: No results for input(s): "TSH", "T4TOTAL", "FREET4", "T3FREE", "THYROIDAB" in the last 72 hours. Anemia Panel: No results for input(s): "VITAMINB12", "FOLATE", "FERRITIN", "TIBC", "IRON", "RETICCTPCT" in the last 72 hours. Sepsis Labs: No results for input(s): "PROCALCITON", "LATICACIDVEN" in the last 168 hours.  No results found for this or any previous visit (from the past 240 hour(s)).   Radiology Studies: DG ESOPHAGUS W SINGLE CM (SOL OR THIN BA)  Addendum Date: 05/08/2022   ADDENDUM REPORT: 05/08/2022 11:54 ADDENDUM: These results were called by telephone at the time of interpretation on 05/08/2022 at 11:53 am to provider [facility], who verbally acknowledged these results. Electronically Signed   By: Lorin Picket M.D.   On: 05/08/2022 11:54   Result Date: 05/08/2022 CLINICAL DATA:  Difficulty swallowing food. Recent esophagectomy gastric pull-through. EXAM: ESOPHAGUS/BARIUM SWALLOW/TABLET STUDY TECHNIQUE: Single contrast examination was performed using thin liquid barium. This exam was performed by Brynda Greathouse PA and Lorin Picket, MD, and was supervised and interpreted by Lorin Picket, MD. FLUOROSCOPY: Radiation Exposure Index (as provided by the fluoroscopic device): 4.8 mGy Kerma COMPARISON:  CT chest abdomen pelvis 04/18/2022, esophagram 04/10/2022. FINDINGS: Swallowing: Not assessed. Pharynx: Not assessed. Esophagus: Ivor Lewis esophagectomy with gastric pull-through. There is immediate large volume leak of contrast at the upper gastroesophageal anastomosis. Contrast flows into the stomach, stagnating in the distal stomach, below the diaphragmatic hiatus. Contrast administration was terminated, limiting the evaluation for additional leak below the diaphragmatic hiatus. Esophageal motility: Not assessed. Hiatal Hernia: N/A. Gastroesophageal reflux: The assessed. Ingested 55m barium tablet: Not assessed.  Other: None. IMPRESSION: 1. Ivor Lewis esophagectomy with gastric pull-through. Immediate large volume leak of contrast at the upper gastroesophageal anastomosis. 2. Study was terminated due to gastroesophageal leak but additional imaging assessment was performed, showing stasis of contrast in the distal stomach. Evaluation for a leak below the diaphragmatic hiatus is therefore limited. Electronically Signed: By: MLorin PicketM.D. On: 05/08/2022 11:12    Scheduled Meds:  Chlorhexidine Gluconate Cloth  6 each Topical Daily   heparin  5,000 Units Subcutaneous Q8H   sodium chloride flush  3 mL Intravenous Q12H   Continuous Infusions:  sodium chloride 75 mL/hr at 05/07/22 2229     LOS: 3 days   RDarliss Cheney MD [facility]  05/08/2022, 12:46 PM   *Please note that this is a verbal dictation therefore any spelling or grammatical errors are due to the "DBrinkleyOne" system interpretation.  Please page via [email or web address] do not message via secure chat for urgent patient care matters. Secure chat can be used for  non urgent patient care matters.  How to contact the [facility] Attending or Consulting provider [place] or covering provider during after hours [place], for this patient?  Check the care team in [facility] and look for a) attending/consulting [facility] provider listed and b) the [facility] team listed. Page or secure chat [phone]. Log into [email or web address] and use [place] universal password to access. If you do not have the password, please contact the hospital operator. Locate the [facility] provider you are looking for under [facility] and page to a number that you can be directly reached. If you still have difficulty reaching the provider, please page the [facility] (Director on Call) for the Hospitalists listed on amion for assistance.

## 2022-05-08 NOTE — Anesthesia Preprocedure Evaluation (Addendum)
Anesthesia Evaluation  Patient identified by MRN, date of birth, ID band  Airway Mallampati: II       Dental   Pulmonary shortness of breath, pneumonia, former smoker   breath sounds clear to auscultation       Cardiovascular hypertension,  Rhythm:Regular Rate:Normal     Neuro/Psych  Headaches PSYCHIATRIC DISORDERS         GI/Hepatic negative GI ROS,,,  Endo/Other    Renal/GU Renal disease     Musculoskeletal   Abdominal   Peds  Hematology   Anesthesia Other Findings   Reproductive/Obstetrics                             Anesthesia Physical Anesthesia Plan  ASA: 3  Anesthesia Plan: General   Post-op Pain Management:    Induction:   PONV Risk Score and Plan: Ondansetron, Dexamethasone and Midazolam  Airway Management Planned: Oral ETT  Additional Equipment: Arterial line  Intra-op Plan:   Post-operative Plan: Possible Post-op intubation/ventilation  Informed Consent:      Dental advisory given  Plan Discussed with: Anesthesiologist and CRNA  Anesthesia Plan Comments:         Anesthesia Quick Evaluation

## 2022-05-08 NOTE — Progress Notes (Signed)
Report called to [facility] on 4E.

## 2022-05-08 NOTE — Consult Note (Signed)
[address]       [place] [address]             [phone]                    Keith Moreno [place] Medical Record #[id number] Date of Birth: August 26, 1960  Referring: Barrie Folk Primary Care: [facility], [facility] Primary Cardiologist: None  Chief Complaint:   No chief complaint on file.   History of Present Illness:    Keith Moreno 61 y.o. male was readmitted to the hospitalist service secondary to failure to thrive and dysphagia.  He underwent a robotic assisted Ivor Lewis esophagectomy on 04/05/2022.  His postoperative course was uncomplicated.  He was discharged tolerating a soft mechanical diet.  He underwent an esophagram today which showed extravasation at the GE anastamosis, and the diaphragm.    Past Medical History:  Diagnosis Date   Anxiety    Cancer ([place])    Esophageal Cancer   COVID 2022   mild case   Dyspnea    r/t chemo and radiation   Headache    History of kidney stones    Hypertension    Kidney infection    at age 49   Pneumonia    61 years old   Pre-diabetes    PTSD (post-traumatic stress disorder)    per pt, if woken up suddenly he "cocks back arm" as if to punch but usually wakes up enough to come to before he hits anyone    Past Surgical History:  Procedure Laterality Date   BIOPSY  11/13/2021   Procedure: BIOPSY;  Surgeon: Jerene Bears, MD;  Location: [place];  Service: Gastroenterology;;   BIOPSY  12/14/2021   Procedure: BIOPSY;  Surgeon: Irving Copas., MD;  Location: [place];  Service: Gastroenterology;;   BRONCHIAL BRUSHINGS  12/14/2021   Procedure: BRONCHIAL BRUSHINGS;  Surgeon: Collene Gobble, MD;  Location: [place];  Service: Cardiopulmonary;;   BRONCHIAL NEEDLE ASPIRATION BIOPSY  12/14/2021   Procedure: BRONCHIAL NEEDLE ASPIRATION BIOPSIES;  Surgeon: Collene Gobble, MD;  Location: [place];  Service: Cardiopulmonary;;   COLONOSCOPY  2018   CYST EXCISION   2022   left side of head   ESOPHAGECTOMY  04/05/2022   ESOPHAGOGASTRODUODENOSCOPY N/A 11/13/2021   Procedure: ESOPHAGOGASTRODUODENOSCOPY (EGD);  Surgeon: Jerene Bears, MD;  Location: [facility] ENDOSCOPY;  Service: Gastroenterology;  Laterality: N/A;   ESOPHAGOGASTRODUODENOSCOPY N/A 04/05/2022   Procedure: ESOPHAGOGASTRODUODENOSCOPY (EGD);  Surgeon: Lajuana Matte, MD;  Location: [facility];  Service: Thoracic;  Laterality: N/A;   ESOPHAGOGASTRODUODENOSCOPY (EGD) WITH PROPOFOL N/A 12/14/2021   Procedure: ESOPHAGOGASTRODUODENOSCOPY (EGD) WITH PROPOFOL;  Surgeon: Irving Copas., MD;  Location: [place];  Service: Gastroenterology;  Laterality: N/A;   EUS N/A 12/14/2021   Procedure: UPPER ENDOSCOPIC ULTRASOUND (EUS) LINEAR;  Surgeon: Irving Copas., MD;  Location: [place];  Service: Gastroenterology;  Laterality: N/A;   FINE NEEDLE ASPIRATION  12/14/2021   Procedure: FINE NEEDLE ASPIRATION (FNA) LINEAR;  Surgeon: Irving Copas., MD;  Location: [place];  Service: Gastroenterology;;   FOREIGN BODY REMOVAL  11/13/2021   Procedure: FOREIGN BODY REMOVAL;  Surgeon: Jerene Bears, MD;  Location: [place];  Service: Gastroenterology;;   HUMERUS FRACTURE SURGERY Right    INTERCOSTAL NERVE BLOCK  04/05/2022   Procedure: INTERCOSTAL NERVE BLOCK;  Surgeon: Lajuana Matte, MD;  Location: [place];  Service: Thoracic;;   IR IMAGING GUIDED PORT INSERTION  12/23/2021   IR THORACENTESIS ASP PLEURAL SPACE W/IMG GUIDE  04/19/2022   MULTIPLE TOOTH EXTRACTIONS  2015   NODE DISSECTION  04/05/2022   Procedure: NODE DISSECTION;  Surgeon: Lajuana Matte, MD;  Location: [place];  Service: Thoracic;;   TONSILLECTOMY AND ADENOIDECTOMY     as a child   VIDEO BRONCHOSCOPY WITH ENDOBRONCHIAL ULTRASOUND Bilateral 12/14/2021   Procedure: VIDEO BRONCHOSCOPY WITH ENDOBRONCHIAL ULTRASOUND;  Surgeon: Collene Gobble, MD;  Location: [place];  Service: Cardiopulmonary;  Laterality:  Bilateral;    Family History  Problem Relation Age of Onset   Hypertension Mother    Diabetes Mother    Bipolar disorder Mother    Hypertension Father    Kidney cancer Sister    Colon cancer Paternal Uncle    Cancer Cousin    Bladder Cancer Cousin    Cancer Cousin      Social History   Tobacco Use  Smoking Status Former   Types: Cigars, Cigarettes, Pipe   Quit date: 1983   Years since quitting: 40.8  Smokeless Tobacco Former   Types: Chew   Quit date: 1983    Social History   Substance and Sexual Activity  Alcohol Use Yes   Comment: Rarely     Allergies  Allergen Reactions   Procaine     Other reaction(s): Other, Respiratory Distress   Chocolate     Hyperactivity, respiratory distress   Other     Novocaine- respiratory distress    Current Facility-Administered Medications  Medication Dose Route Frequency Provider Last Rate Last Admin   0.9 %  sodium chloride infusion   Intravenous Continuous Darliss Cheney, MD 75 mL/hr at 05/08/22 1627 New Bag at 05/08/22 1627   acetaminophen (TYLENOL) tablet 650 mg  650 mg Oral Q6H PRN Lenore Cordia, MD   650 mg at 05/08/22 0110   Or   acetaminophen (TYLENOL) suppository 650 mg  650 mg Rectal Q6H PRN Lenore Cordia, MD       Chlorhexidine Gluconate Cloth 2 % PADS 6 each  6 each Topical Daily Lenore Cordia, MD   6 each at 05/08/22 1625   heparin injection 5,000 Units  5,000 Units Subcutaneous Q8H Zada Finders R, MD   5,000 Units at 05/08/22 1624   hydrALAZINE (APRESOLINE) injection 10 mg  10 mg Intravenous Q6H PRN Lenore Cordia, MD       ondansetron (ZOFRAN) tablet 4 mg  4 mg Oral Q6H PRN Lenore Cordia, MD       Or   ondansetron (ZOFRAN) injection 4 mg  4 mg Intravenous Q6H PRN Zada Finders R, MD   4 mg at 05/07/22 0844   polyethylene glycol (MIRALAX / GLYCOLAX) packet 17 g  17 g Oral Daily Pahwani, Einar Grad, MD       senna-docusate (Senokot-S) tablet 1 tablet  1 tablet Oral QHS PRN Lenore Cordia, MD   1 tablet at  05/08/22 0109   sodium chloride flush (NS) 0.9 % injection 10-40 mL  10-40 mL Intracatheter PRN Zada Finders R, MD       sodium chloride flush (NS) 0.9 % injection 3 mL  3 mL Intravenous Q12H Zada Finders R, MD   3 mL at 05/07/22 1103    Review of Systems  Constitutional:  Positive for malaise/fatigue. Negative for fever.  Respiratory:  Negative for shortness of breath.   Cardiovascular:  Negative for chest pain.  Gastrointestinal:  Positive for abdominal pain.    PHYSICAL EXAMINATION:  BP (!) 147/85 (BP Location: Left Arm)   Pulse 79   Temp 98.2 F (36.8 C) (Oral)   Resp 18   SpO2 98%   Physical Exam Constitutional:      General: He is not in acute distress.    Appearance: He is obese. He is not ill-appearing or toxic-appearing.  Eyes:     Extraocular Movements: Extraocular movements intact.  Cardiovascular:     Rate and Rhythm: Normal rate.  Abdominal:     General: There is no distension.  Musculoskeletal:     Cervical back: Normal range of motion.  Skin:    General: Skin is warm and dry.  Neurological:     General: No focal deficit present.     Mental Status: He is alert and oriented to person, place, and time.      Diagnostic Studies & Laboratory data:     Recent Radiology Findings:   DG ESOPHAGUS W SINGLE CM (SOL OR THIN BA)  Addendum Date: 05/08/2022   ADDENDUM REPORT: 05/08/2022 11:54 ADDENDUM: These results were called by telephone at the time of interpretation on 05/08/2022 at 11:53 am to provider [facility], who verbally acknowledged these results. Electronically Signed   By: Lorin Picket M.D.   On: 05/08/2022 11:54   Result Date: 05/08/2022 CLINICAL DATA:  Difficulty swallowing food. Recent esophagectomy gastric pull-through. EXAM: ESOPHAGUS/BARIUM SWALLOW/TABLET STUDY TECHNIQUE: Single contrast examination was performed using thin liquid barium. This exam was performed by Brynda Greathouse PA and Lorin Picket, MD, and was supervised and  interpreted by Lorin Picket, MD. FLUOROSCOPY: Radiation Exposure Index (as provided by the fluoroscopic device): 4.8 mGy Kerma COMPARISON:  CT chest abdomen pelvis 04/18/2022, esophagram 04/10/2022. FINDINGS: Swallowing: Not assessed. Pharynx: Not assessed. Esophagus: Ivor Lewis esophagectomy with gastric pull-through. There is immediate large volume leak of contrast at the upper gastroesophageal anastomosis. Contrast flows into the stomach, stagnating in the distal stomach, below the diaphragmatic hiatus. Contrast administration was terminated, limiting the evaluation for additional leak below the diaphragmatic hiatus. Esophageal motility: Not assessed. Hiatal Hernia: N/A. Gastroesophageal reflux: The assessed. Ingested 54m barium tablet: Not assessed. Other: None. IMPRESSION: 1. Ivor Lewis esophagectomy with gastric pull-through. Immediate large volume leak of contrast at the upper gastroesophageal anastomosis. 2. Study was terminated due to gastroesophageal leak but additional imaging assessment was performed, showing stasis of contrast in the distal stomach. Evaluation for a leak below the diaphragmatic hiatus is therefore limited. Electronically Signed: By: MLorin PicketM.D. On: 05/08/2022 11:12   X-ray chest PA and lateral  Result Date: 05/05/2022 CLINICAL DATA:  History of esophagectomy. EXAM: CHEST - 2 VIEW COMPARISON:  04/27/2022. FINDINGS: The heart size and mediastinal contours are stable. A perihilar opacities noted on the right measuring 11.7 x 8.3 cm, not significantly changed from the prior exam. Lung volumes are low and mild atelectasis is noted at the lung bases. Small left pleural effusion. No pneumothorax. A stable right chest port is noted. Degenerative changes are present in the thoracic spine. IMPRESSION: 1. Small left pleural effusion with atelectasis at the lung bases, unchanged. 2. Remaining findings are unchanged. Electronically Signed   By: LBrett FairyM.D.   On: 05/05/2022  23:12   DG Chest 2 View  Result Date: 04/27/2022 CLINICAL DATA:  Follow-up of pleural effusion EXAM: CHEST - 2 VIEW COMPARISON:  04/25/2022 FINDINGS: Right Port-A-Cath tip at low SVC. Midline trachea. Mild cardiomegaly. Stable mediastinal contours, status post esophagectomy and gastric pull-through. Small left pleural effusion. No pneumothorax. No congestive  failure. Left base and right infrahilar airspace disease are similar. IMPRESSION: No change since 04/25/2022. Small left pleural effusion with bilateral lower lung airspace disease, likely atelectasis. Electronically Signed   By: Abigail Miyamoto M.D.   On: 04/27/2022 08:13   DG Chest Port 1 View  Result Date: 04/25/2022 CLINICAL DATA:  Pleural effusion, shortness of breath EXAM: PORTABLE CHEST 1 VIEW COMPARISON:  04/20/2022 FINDINGS: Chest port catheter tip overlies the distal superior vena cava. Unchanged cardiomediastinal silhouette. Postsurgical changes of Ivor Lewis esophagectomy with gastric pull-through. Stable small left pleural effusion with increased adjacent atelectasis. No evidence of pneumothorax. IMPRESSION: Postsurgical changes of esophagectomy. Stable small left pleural effusion with increased adjacent atelectasis. Electronically Signed   By: Maurine Simmering M.D.   On: 04/25/2022 10:56   DG Chest 1 View  Result Date: 04/20/2022 CLINICAL DATA:  Status post esophagectomy EXAM: CHEST  1 VIEW COMPARISON:  04/19/2022 FINDINGS: Unchanged AP portable chest radiograph status post esophagectomy. Unchanged contour of the right mediastinum reflecting a fat containing postoperative hernia. Right-sided chest tube remains in position. Cardiomegaly. Small left pleural effusion. Right chest port catheter. No new airspace opacity. IMPRESSION: Unchanged AP portable chest radiograph status post esophagectomy. No new airspace opacity. Electronically Signed   By: Delanna Ahmadi M.D.   On: 04/20/2022 08:21   IR THORACENTESIS ASP PLEURAL SPACE W/IMG  GUIDE  Result Date: 04/19/2022 INDICATION: Shortness of breath. Left-sided pleural effusion. Request for diagnostic and therapeutic thoracentesis. EXAM: ULTRASOUND GUIDED LEFT THORACENTESIS MEDICATIONS: 1% plain lidocaine, 7 mL COMPLICATIONS: None immediate. PROCEDURE: An ultrasound guided thoracentesis was thoroughly discussed with the patient and questions answered. The benefits, risks, alternatives and complications were also discussed. The patient understands and wishes to proceed with the procedure. Written consent was obtained. Ultrasound was performed to localize and mark an adequate pocket of fluid in the left chest. The area was then prepped and draped in the normal sterile fashion. 1% Lidocaine was used for local anesthesia. Under ultrasound guidance a 6 Fr Safe-T-Centesis catheter was introduced. Thoracentesis was performed. The catheter was removed and a dressing applied. FINDINGS: A total of approximately 1.1 L of serosanguineous pleural fluid was removed. Samples were sent to the laboratory as requested by the clinical team. IMPRESSION: Successful ultrasound guided left thoracentesis yielding 1.1 L of pleural fluid. Read by: Ascencion Dike PA-C Electronically Signed   By: Sandi Mariscal M.D.   On: 04/19/2022 13:30   DG Chest 1 View  Result Date: 04/19/2022 CLINICAL DATA:  Pleural effusion on left.  Post thoracentesis. EXAM: CHEST  1 VIEW COMPARISON:  Chest two views 04/18/2022, CT chest 04/18/2022, PET-CT 09/26/2021 FINDINGS: Right chest wall porta catheter tip overlies the central superior vena cava. Heart size is mildly enlarged. There is again widening of the mediastinum and bilateral hilar contours, consistent with the bilateral calcified lymphadenopathy on prior 03/01/2022 PET-CT. On frontal view, no significant pleural effusion is seen, however note is made that a moderate pleural effusion was seen on yesterday's chest CT and only visualized on yesterday's lateral chest radiograph. No  pneumothorax is seen. No acute skeletal abnormality. IMPRESSION: 1. No significant pleural effusion is seen following thoracentesis, however note is made that a moderate pleural effusion was seen on yesterday's chest CT and this was only visualized on yesterday's lateral chest radiograph. 2. No pneumothorax. Electronically Signed   By: Yvonne Kendall M.D.   On: 04/19/2022 12:49   CT CHEST ABDOMEN PELVIS W CONTRAST  Result Date: 04/18/2022 CLINICAL DATA:  Sepsis, esophageal cancer *  Tracking Code: BO * EXAM: CT CHEST, ABDOMEN, AND PELVIS WITH CONTRAST TECHNIQUE: Multidetector CT imaging of the chest, abdomen and pelvis was performed following the standard protocol during bolus administration of intravenous contrast. RADIATION DOSE REDUCTION: This exam was performed according to the departmental dose-optimization program which includes automated exposure control, adjustment of the mA and/or kV according to patient size and/or use of iterative reconstruction technique. CONTRAST:  69m OMNIPAQUE IOHEXOL 350 MG/ML SOLN, additional enteric contrast administered per percutaneous jejunostomy COMPARISON:  CT abdomen pelvis, 01/08/2015, PET-CT, 03/01/2022 FINDINGS: CT CHEST FINDINGS Cardiovascular: Right chest port catheter. Aortic atherosclerosis. Normal heart size. Left coronary artery calcifications. Enlargement of the main pulmonary artery measuring up to 3.8 cm in caliber. No pericardial effusion. Mediastinum/Nodes: Numerous, coarsely calcified mediastinal and hilar lymph nodes, unchanged (series 3, image 21). Status post interval pull-through esophagectomy. Fat containing postoperative hernia adjacent to the gastric pull-through (series 3, image 31). Thyroid and trachea are normal. Lungs/Pleura: Moderate left pleural effusion associated atelectasis or consolidation. Trace, loculated appearing right pleural effusion with right-sided chest tube in position posteriorly about the lung. Musculoskeletal: No chest wall  abnormality. No acute osseous findings. CT ABDOMEN PELVIS FINDINGS Hepatobiliary: No solid liver abnormality is seen. No gallstones, gallbladder wall thickening, or biliary dilatation. Pancreas: Unremarkable. No pancreatic ductal dilatation or surrounding inflammatory changes. Spleen: Normal in size without significant abnormality. Adrenals/Urinary Tract: Adrenal glands are unremarkable. Small bilateral nonobstructive renal calculi. No hydronephrosis. Small focus of air within the urinary bladder, presumably secondary to recent catheterization. Stomach/Bowel: Status post pull-through esophagectomy. Elongated, extraluminal heterogeneous air and fluid collection anterior to the gastric body, pylorus, and duodenal bulb, measuring at least 14.0 x 4.0 x 3.2 cm (series 3, image 61, series 7, image 31). Percutaneous jejunostomy tube. Appendix appears normal. Descending and sigmoid diverticulosis. Vascular/Lymphatic: No significant vascular findings are present. No enlarged abdominal or pelvic lymph nodes. Reproductive: No mass or other abnormality. Other: Fat containing right inguinal hernia.  No ascites. Musculoskeletal: No acute osseous findings. IMPRESSION: 1. Status post interval pull-through esophagectomy. 2. Elongated, extraluminal heterogeneous air and fluid collection anterior to the gastric body, pylorus, and duodenal bulb, measuring at least 14.0 x 4.0 x 3.2 cm. Findings are consistent with postoperative perforation and or abscess. 3. Moderate left pleural effusion and associated atelectasis or consolidation. Trace, loculated appearing right pleural effusion with right-sided chest tube in position posteriorly about the lung. 4. Enlargement of the main pulmonary artery, as can be seen in pulmonary hypertension. 5. Coronary artery disease. 6. Nonobstructive bilateral nephrolithiasis. 7. Descending and sigmoid diverticulosis without evidence of acute diverticulitis. These results will be called to the ordering  clinician or representative by the Radiologist Assistant, and communication documented in the PACS or [employer] Aortic Atherosclerosis (ICD10-I70.0). Electronically Signed   By: ADelanna AhmadiM.D.   On: 04/18/2022 15:25   DG Chest 2 View  Result Date: 04/18/2022 CLINICAL DATA:  Surgery follow-up. History of esophagectomy. Shortness of breath and weakness. History of esophageal cancer. EXAM: CHEST - 2 VIEW COMPARISON:  AP chest 04/17/2022, 04/14/2022, 04/13/2022, 04/11/2022, 04/09/2022; chest two views 04/03/2022; PET-CT 03/01/2022 FINDINGS: Right chest wall porta catheter tip again overlies the central superior vena cava. Cardiac silhouette is again at the upper limits of normal size for AP technique. There is again enlargement of the bilateral hila, noting bilateral calcified lymphadenopathy on prior 03/01/2022 PET-CT. Mildly decreased lung volumes. The lateral view there is associated posterior basilar airspace opacity, possible atelectasis. Otherwise, no focal airspace opacity on frontal view to indicate  pneumonia. On lateral view there appear to be small bilateral pleural effusions. No pneumothorax. Moderate multilevel degenerative disc changes of the thoracic spine. IMPRESSION: Small bilateral pleural effusions. Likely associated subsegmental atelectasis. Electronically Signed   By: Yvonne Kendall M.D.   On: 04/18/2022 08:19   DG Chest Port 1 View  Result Date: 04/17/2022 CLINICAL DATA:  [id number] with pneumothorax.  Recent esophagectomy. EXAM: PORTABLE CHEST 1 VIEW COMPARISON:  04/14/2022 portable chest FINDINGS: 5:31 a.m. [place] J port catheter again terminates in the distal SVC. The heart is enlarged. No vascular congestion is seen. No significant pleural effusion. Bilateral hilar adenopathy/enlargement is again noted, mediastinal widening and aortic ectasia and tortuosity. Today there is increased opacity in the retrocardiac left lower lobe area which could be atelectasis, pneumonia or  aspiration. The remaining lungs are clear. In all other respects no further changes. IMPRESSION: Increased opacity left base, could be atelectasis or pneumonia. Follow-up PA Lat recommended. Electronically Signed   By: Telford Nab M.D.   On: 04/17/2022 07:37   DG CHEST PORT 1 VIEW  Result Date: 04/14/2022 CLINICAL DATA:  Follow-up esophagectomy. EXAM: PORTABLE CHEST 1 VIEW COMPARISON:  View chest x-ray 04/13/2022 FINDINGS: Heart size is normal. Right IJ Port-A-Cath is in place. Hilar adenopathy is again noted. Lung volumes are scratched at the lung volumes remain low. Mild bibasilar atelectasis is stable. IMPRESSION: 1. Stable right IJ Port-A-Cath. 2. Low lung volumes and mild bibasilar atelectasis. Electronically Signed   By: San Morelle M.D.   On: 04/14/2022 06:51   DG Chest Port 1 View  Result Date: 04/13/2022 CLINICAL DATA:  Central line removal.  Re-evaluate Port-A-Cath. EXAM: PORTABLE CHEST 1 VIEW COMPARISON:  Chest radiograph 04/11/2022 FINDINGS: A right jugular catheter has been removed. A right jugular Port-A-Cath remains in place with tip overlying the lower SVC. The cardiomediastinal silhouette is unchanged with mediastinal widening and right greater than left hilar enlargement in the setting of esophagectomy and known lymphadenopathy. There are left greater than right basilar opacities with improved aeration of the right lung base. No sizable pleural effusion or pneumothorax is identified. IMPRESSION: 1. Interval removal of right jugular catheter. Unchanged right jugular Port-A-Cath. 2. Left greater than right basilar atelectasis or consolidation, with improved aeration on the right. Electronically Signed   By: Logan Bores M.D.   On: 04/13/2022 17:48   DG CHEST PORT 1 VIEW  Result Date: 04/11/2022 CLINICAL DATA:  Fever, hx esophagectomy EXAM: PORTABLE CHEST - 1 VIEW COMPARISON:  04/09/2022 FINDINGS: Relatively low lung volumes with some patchy atelectasis/consolidation in both  lung bases. Right hilar mass is again noted. Stable right IJ port catheter to the SVC. The right IJ central venous catheter appears kinked near the skin entry site, tip in the proximal SVC. Heart size upper limits normal for technique. Mass abutting the aortic arch as before. There is blunting of the left lateral costophrenic angle suggesting possible small effusion, stable. Visualized bones unremarkable. IMPRESSION: 1. Low volumes with bibasilar atelectasis/consolidation. 2. 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 3. Kinked right IJ central venous catheter. Electronically Signed   By: Lucrezia Europe M.D.   On: 04/11/2022 08:49   DG ESOPHAGUS W SINGLE CM (SOL OR THIN BA)  Result Date: 04/10/2022 CLINICAL DATA:  Evaluate for leak.  Esophagectomy. EXAM: ESOPHOGRAM/BARIUM SWALLOW TECHNIQUE: Single contrast examination was performed using thin barium or water soluble. FLUOROSCOPY: Radiation Exposure Index (as provided by the fluoroscopic device): 14.6 mGy Kerma COMPARISON:  None Available. FINDINGS: Postsurgical changes are identified.  No leak identified. Motility is within normal limits. IMPRESSION: No leak.  No other abnormalities identified. Electronically Signed   By: Dorise Bullion III M.D.   On: 04/10/2022 10:21   DG Chest Port 1 View  Result Date: 04/09/2022 CLINICAL DATA:  Follow-up exam.  Esophagectomy. EXAM: PORTABLE CHEST 1 VIEW COMPARISON:  04/07/2022 and older exams. FINDINGS: Basilar atelectasis, unchanged from the previous exam. Remainder of the lungs is clear. No convincing pleural effusion.  No pneumothorax. Nasal/orogastric tube, right internal jugular central venous lines and right-sided chest tube are stable. IMPRESSION: 1. No significant change from the most recent prior exam. 2. Mild persistent lung base atelectasis. 3. Support apparatus is stable in well positioned. 4. No pneumothorax. Electronically Signed   By: Lajean Manes M.D.   On: 04/09/2022 08:39       I have independently  reviewed the above radiology studies  and reviewed the findings with the patient.   Recent Lab Findings: Lab Results  Component Value Date   WBC 4.1 05/06/2022   HGB 8.8 (L) 05/06/2022   HCT 27.8 (L) 05/06/2022   PLT 264 05/06/2022   GLUCOSE 114 (H) 05/08/2022   ALT 16 05/08/2022   AST 15 05/08/2022   NA 140 05/08/2022   K 3.5 05/08/2022   CL 108 05/08/2022   CREATININE 1.10 05/08/2022   BUN 14 05/08/2022   CO2 28 05/08/2022   INR 1.1 04/03/2022   HGBA1C 5.1 04/03/2022       Assessment / Plan:   61yo male s/p Robotic assisted Ivor Lewis esophagectomy 1 month ago now with extravasation at the anastomosis, and pyloroplasty site.  Clinically stable.  Will plan for EGD, esophageal stent placement, replacement of the jejunostomy tube, and R VATS, chest tube placement.      Lajuana Matte 05/08/2022 4:44 PM

## 2022-05-09 ENCOUNTER — Other Ambulatory Visit: Payer: Self-pay

## 2022-05-09 ENCOUNTER — Encounter (HOSPITAL_COMMUNITY)
Admission: AD | Disposition: A | Discharge status: Home Health | Payer: Self-pay | Source: Ambulatory Visit | Attending: Thoracic Surgery (Cardiothoracic Vascular Surgery)

## 2022-05-09 ENCOUNTER — Inpatient Hospital Stay (HOSPITAL_COMMUNITY): Payer: No Typology Code available for payment source | Admitting: Anesthesiology

## 2022-05-09 ENCOUNTER — Inpatient Hospital Stay (HOSPITAL_COMMUNITY): Payer: No Typology Code available for payment source

## 2022-05-09 DIAGNOSIS — E43 Unspecified severe protein-calorie malnutrition: Secondary | ICD-10-CM | POA: Diagnosis not present

## 2022-05-09 DIAGNOSIS — K9189 Other postprocedural complications and disorders of digestive system: Secondary | ICD-10-CM

## 2022-05-09 DIAGNOSIS — Z6841 Body Mass Index (BMI) 40.0 and over, adult: Secondary | ICD-10-CM | POA: Diagnosis not present

## 2022-05-09 DIAGNOSIS — Z87891 Personal history of nicotine dependence: Secondary | ICD-10-CM

## 2022-05-09 DIAGNOSIS — I9789 Other postprocedural complications and disorders of the circulatory system, not elsewhere classified: Secondary | ICD-10-CM | POA: Diagnosis not present

## 2022-05-09 DIAGNOSIS — E162 Hypoglycemia, unspecified: Secondary | ICD-10-CM | POA: Diagnosis not present

## 2022-05-09 DIAGNOSIS — I10 Essential (primary) hypertension: Secondary | ICD-10-CM

## 2022-05-09 DIAGNOSIS — L8990 Pressure ulcer of unspecified site, unspecified stage: Secondary | ICD-10-CM | POA: Insufficient documentation

## 2022-05-09 HISTORY — PX: VIDEO ASSISTED THORACOSCOPY: SHX5073

## 2022-05-09 HISTORY — PX: ESOPHAGOGASTRODUODENOSCOPY: SHX5428

## 2022-05-09 HISTORY — PX: LAPAROSCOPIC JEJUNOSTOMY: SHX6880

## 2022-05-09 LAB — COMPREHENSIVE METABOLIC PANEL
ALT: 15 U/L (ref 0–44)
AST: 14 U/L — ABNORMAL LOW (ref 15–41)
Albumin: 2.1 g/dL — ABNORMAL LOW (ref 3.5–5.0)
Alkaline Phosphatase: 84 U/L (ref 38–126)
Anion gap: 6 (ref 5–15)
BUN: 12 mg/dL (ref 8–23)
CO2: 27 mmol/L (ref 22–32)
Calcium: 11.9 mg/dL — ABNORMAL HIGH (ref 8.9–10.3)
Chloride: 107 mmol/L (ref 98–111)
Creatinine, Ser: 0.98 mg/dL (ref 0.61–1.24)
GFR, Estimated: >60 mL/min (ref >60)
Glucose, Bld: 96 mg/dL (ref 70–99)
Potassium: 3.4 mmol/L — ABNORMAL LOW (ref 3.5–5.1)
Sodium: 140 mmol/L (ref 135–145)
Total Bilirubin: 0.7 mg/dL (ref 0.3–1.2)
Total Protein: 5.2 g/dL — ABNORMAL LOW (ref 6.5–8.1)

## 2022-05-09 LAB — GLUCOSE, CAPILLARY: Glucose-Capillary: 149 mg/dL — ABNORMAL HIGH (ref 70–99)

## 2022-05-09 LAB — POCT I-STAT 7, (LYTES, BLD GAS, ICA,H+H)
Acid-Base Excess: 2 mmol/L (ref 0.0–2.0)
Bicarbonate: 25.8 mmol/L (ref 20.0–28.0)
Calcium, Ion: 1.77 mmol/L (ref 1.15–1.40)
HCT: 23 % — ABNORMAL LOW (ref 39.0–52.0)
Hemoglobin: 7.8 g/dL — ABNORMAL LOW (ref 13.0–17.0)
O2 Saturation: 100 %
Patient temperature: 36.2
Potassium: 3.6 mmol/L (ref 3.5–5.1)
Sodium: 138 mmol/L (ref 135–145)
TCO2: 27 mmol/L (ref 22–32)
pCO2 arterial: 36.9 mmHg (ref 32–48)
pH, Arterial: 7.45 (ref 7.35–7.45)
pO2, Arterial: 200 mmHg — ABNORMAL HIGH (ref 83–108)

## 2022-05-09 LAB — PREPARE RBC (CROSSMATCH)

## 2022-05-09 SURGERY — EGD (ESOPHAGOGASTRODUODENOSCOPY)
Anesthesia: General

## 2022-05-09 SURGERY — CREATION, JEJUNOSTOMY, LAPAROSCOPIC
Anesthesia: General | Laterality: Right

## 2022-05-09 MED ORDER — BUPIVACAINE LIPOSOME 1.3 % IJ SUSP
INTRAMUSCULAR | Status: DC | PRN
  Administered 2022-05-09: 50 mL

## 2022-05-09 MED ORDER — PHENYLEPHRINE 80 MCG/ML (10ML) SYRINGE FOR IV PUSH (FOR BLOOD PRESSURE SUPPORT)
PREFILLED_SYRINGE | INTRAVENOUS | Status: DC | PRN
  Administered 2022-05-09 (×2): 160 ug via INTRAVENOUS

## 2022-05-09 MED ORDER — AMIODARONE HCL IN DEXTROSE 360-4.14 MG/200ML-% IV SOLN: 30.0000 mg/h | INTRAVENOUS | Status: DC

## 2022-05-09 MED ORDER — JEVITY 1.2 CAL PO LIQD
1000.0000 mL | ORAL | Status: DC
  Filled 2022-05-09: qty 1000

## 2022-05-09 MED ORDER — ENOXAPARIN SODIUM 40 MG/0.4ML IJ SOSY
40.0000 mg | PREFILLED_SYRINGE | Freq: Every day | INTRAMUSCULAR | Status: DC
  Administered 2022-05-10 – 2022-06-05 (×27): 40 mg via SUBCUTANEOUS
  Filled 2022-05-09 (×26): qty 0.4

## 2022-05-09 MED ORDER — AMIODARONE HCL IN DEXTROSE 360-4.14 MG/200ML-% IV SOLN: 60.0000 mg/h | INTRAVENOUS | Status: DC

## 2022-05-09 MED ORDER — DEXAMETHASONE SODIUM PHOSPHATE 10 MG/ML IJ SOLN
INTRAMUSCULAR | Status: DC | PRN
  Administered 2022-05-09: 8 mg via INTRAVENOUS

## 2022-05-09 MED ORDER — FLUCONAZOLE IN SODIUM CHLORIDE 200-0.9 MG/100ML-% IV SOLN
200.0000 mg | INTRAVENOUS | Status: DC
  Administered 2022-05-10 – 2022-05-21 (×12): 200 mg via INTRAVENOUS
  Filled 2022-05-09 (×13): qty 100

## 2022-05-09 MED ORDER — FENTANYL CITRATE (PF) 250 MCG/5ML IJ SOLN
INTRAMUSCULAR | Status: AC
  Filled 2022-05-09: qty 5

## 2022-05-09 MED ORDER — PROPOFOL 10 MG/ML IV BOLUS
INTRAVENOUS | Status: DC | PRN
  Administered 2022-05-09 (×2): 30 mg via INTRAVENOUS
  Administered 2022-05-09: 150 mg via INTRAVENOUS

## 2022-05-09 MED ORDER — ROCURONIUM BROMIDE 10 MG/ML (PF) SYRINGE
PREFILLED_SYRINGE | INTRAVENOUS | Status: DC | PRN
  Administered 2022-05-09 (×2): 20 mg via INTRAVENOUS
  Administered 2022-05-09: 50 mg via INTRAVENOUS
  Administered 2022-05-09 (×2): 30 mg via INTRAVENOUS
  Administered 2022-05-09: 10 mg via INTRAVENOUS

## 2022-05-09 MED ORDER — CEFAZOLIN SODIUM 1 G IJ SOLR
INTRAMUSCULAR | Status: AC
  Filled 2022-05-09: qty 40

## 2022-05-09 MED ORDER — CHLORHEXIDINE GLUCONATE CLOTH 2 % EX PADS
6.0000 | MEDICATED_PAD | Freq: Every day | CUTANEOUS | Status: DC
  Administered 2022-05-09 – 2022-06-05 (×28): 6 via TOPICAL

## 2022-05-09 MED ORDER — FLUCONAZOLE IN SODIUM CHLORIDE 400-0.9 MG/200ML-% IV SOLN
400.0000 mg | INTRAVENOUS | Status: AC
  Administered 2022-05-09: 400 mg via INTRAVENOUS
  Filled 2022-05-09: qty 200

## 2022-05-09 MED ORDER — PROPOFOL 10 MG/ML IV BOLUS
INTRAVENOUS | Status: AC
  Filled 2022-05-09: qty 20

## 2022-05-09 MED ORDER — FENTANYL CITRATE (PF) 250 MCG/5ML IJ SOLN
INTRAMUSCULAR | Status: DC | PRN
  Administered 2022-05-09 (×3): 50 ug via INTRAVENOUS
  Administered 2022-05-09: 150 ug via INTRAVENOUS

## 2022-05-09 MED ORDER — KETOROLAC TROMETHAMINE 30 MG/ML IJ SOLN
30.0000 mg | Freq: Four times a day (QID) | INTRAMUSCULAR | Status: DC
  Administered 2022-05-09 – 2022-05-11 (×7): 30 mg via INTRAVENOUS
  Filled 2022-05-09 (×8): qty 1

## 2022-05-09 MED ORDER — ONDANSETRON HCL 4 MG/2ML IJ SOLN
INTRAMUSCULAR | Status: DC | PRN
  Administered 2022-05-09: 4 mg via INTRAVENOUS

## 2022-05-09 MED ORDER — 0.9 % SODIUM CHLORIDE (POUR BTL) OPTIME
TOPICAL | Status: DC | PRN
  Administered 2022-05-09: 2000 mL

## 2022-05-09 MED ORDER — HYDROMORPHONE HCL 1 MG/ML IJ SOLN
0.2500 mg | INTRAMUSCULAR | Status: DC | PRN
  Administered 2022-05-09 (×2): 0.5 mg via INTRAVENOUS

## 2022-05-09 MED ORDER — BUPIVACAINE LIPOSOME 1.3 % IJ SUSP
INTRAMUSCULAR | Status: AC
  Filled 2022-05-09: qty 20

## 2022-05-09 MED ORDER — BUPIVACAINE HCL (PF) 0.5 % IJ SOLN
INTRAMUSCULAR | Status: AC
  Filled 2022-05-09: qty 30

## 2022-05-09 MED ORDER — HYDROCODONE-ACETAMINOPHEN 7.5-325 MG/15ML PO SOLN
10.0000 mL | Freq: Four times a day (QID) | ORAL | Status: DC | PRN
  Administered 2022-05-13 – 2022-06-04 (×39): 10 mL
  Filled 2022-05-09 (×41): qty 15

## 2022-05-09 MED ORDER — SODIUM CHLORIDE 0.9 % IV SOLN: 2.0000 g | Freq: Four times a day (QID) | INTRAVENOUS | Status: DC

## 2022-05-09 MED ORDER — LIDOCAINE 2% (20 MG/ML) 5 ML SYRINGE
INTRAMUSCULAR | Status: AC
  Filled 2022-05-09: qty 5

## 2022-05-09 MED ORDER — ONDANSETRON HCL 4 MG/2ML IJ SOLN
4.0000 mg | INTRAMUSCULAR | Status: DC | PRN
  Administered 2022-05-27 – 2022-06-03 (×3): 4 mg via INTRAVENOUS
  Filled 2022-05-09 (×3): qty 2

## 2022-05-09 MED ORDER — POTASSIUM CHLORIDE IN NACL 20-0.9 MEQ/L-% IV SOLN
INTRAVENOUS | Status: DC
  Filled 2022-05-09 (×12): qty 1000

## 2022-05-09 MED ORDER — LACTATED RINGERS IV SOLN: INTRAVENOUS | Status: DC | PRN

## 2022-05-09 MED ORDER — MIDAZOLAM HCL 2 MG/2ML IJ SOLN
INTRAMUSCULAR | Status: DC | PRN
  Administered 2022-05-09: 2 mg via INTRAVENOUS

## 2022-05-09 MED ORDER — SUCCINYLCHOLINE CHLORIDE 200 MG/10ML IV SOSY
PREFILLED_SYRINGE | INTRAVENOUS | Status: AC
  Filled 2022-05-09: qty 10

## 2022-05-09 MED ORDER — MIDAZOLAM HCL 2 MG/2ML IJ SOLN
INTRAMUSCULAR | Status: AC
  Filled 2022-05-09: qty 2

## 2022-05-09 MED ORDER — HYDROMORPHONE HCL 1 MG/ML IJ SOLN
INTRAMUSCULAR | Status: AC
  Filled 2022-05-09: qty 1

## 2022-05-09 MED ORDER — LIDOCAINE 2% (20 MG/ML) 5 ML SYRINGE
INTRAMUSCULAR | Status: DC | PRN
  Administered 2022-05-09: 60 mg via INTRAVENOUS

## 2022-05-09 MED ORDER — DEXAMETHASONE SODIUM PHOSPHATE 10 MG/ML IJ SOLN
INTRAMUSCULAR | Status: AC
  Filled 2022-05-09: qty 1

## 2022-05-09 MED ORDER — PIPERACILLIN-TAZOBACTAM 3.375 G IVPB
3.3750 g | Freq: Three times a day (TID) | INTRAVENOUS | Status: DC
  Administered 2022-05-09 – 2022-05-30 (×62): 3.375 g via INTRAVENOUS
  Filled 2022-05-09 (×66): qty 50

## 2022-05-09 MED ORDER — HYDRALAZINE HCL 20 MG/ML IJ SOLN
5.0000 mg | Freq: Once | INTRAMUSCULAR | Status: AC
  Administered 2022-05-09 (×2): 2.5 mg via INTRAVENOUS

## 2022-05-09 MED ORDER — MORPHINE SULFATE (PF) 2 MG/ML IV SOLN
1.0000 mg | INTRAVENOUS | Status: DC | PRN
  Administered 2022-05-13 – 2022-05-19 (×2): 2 mg via INTRAVENOUS
  Administered 2022-05-20 – 2022-05-21 (×2): 4 mg via INTRAVENOUS
  Administered 2022-05-21 – 2022-06-02 (×6): 2 mg via INTRAVENOUS
  Filled 2022-05-09 (×4): qty 1
  Filled 2022-05-09: qty 2
  Filled 2022-05-09: qty 1
  Filled 2022-05-09: qty 2
  Filled 2022-05-09 (×3): qty 1

## 2022-05-09 MED ORDER — AMIODARONE HCL IN DEXTROSE 360-4.14 MG/200ML-% IV SOLN
30.0000 mg/h | INTRAVENOUS | Status: DC
  Filled 2022-05-09 (×2): qty 200

## 2022-05-09 MED ORDER — PHENYLEPHRINE 80 MCG/ML (10ML) SYRINGE FOR IV PUSH (FOR BLOOD PRESSURE SUPPORT)
PREFILLED_SYRINGE | INTRAVENOUS | Status: AC
  Filled 2022-05-09: qty 10

## 2022-05-09 MED ORDER — SODIUM CHLORIDE 0.9% IV SOLUTION: Freq: Once | INTRAVENOUS | Status: DC

## 2022-05-09 MED ORDER — MORPHINE SULFATE (PF) 2 MG/ML IV SOLN
INTRAVENOUS | Status: AC
  Filled 2022-05-09: qty 1

## 2022-05-09 MED ORDER — SUCCINYLCHOLINE CHLORIDE 200 MG/10ML IV SOSY
PREFILLED_SYRINGE | INTRAVENOUS | Status: DC | PRN
  Administered 2022-05-09: 120 mg via INTRAVENOUS

## 2022-05-09 MED ORDER — CHLORHEXIDINE GLUCONATE 0.12 % MT SOLN
15.0000 mL | OROMUCOSAL | Status: AC
  Administered 2022-05-09: 15 mL via OROMUCOSAL
  Filled 2022-05-09: qty 15

## 2022-05-09 MED ORDER — AMIODARONE HCL IN DEXTROSE 360-4.14 MG/200ML-% IV SOLN
60.0000 mg/h | INTRAVENOUS | Status: DC
  Administered 2022-05-09 (×2): 60 mg/h via INTRAVENOUS
  Filled 2022-05-09 (×2): qty 200

## 2022-05-09 MED ORDER — LABETALOL HCL 5 MG/ML IV SOLN
INTRAVENOUS | Status: DC | PRN
  Administered 2022-05-09: 5 mg via INTRAVENOUS

## 2022-05-09 MED ORDER — PANTOPRAZOLE SODIUM 40 MG IV SOLR
40.0000 mg | Freq: Two times a day (BID) | INTRAVENOUS | Status: DC
  Administered 2022-05-09 – 2022-06-05 (×54): 40 mg via INTRAVENOUS
  Filled 2022-05-09 (×54): qty 10

## 2022-05-09 MED ORDER — PHENYLEPHRINE HCL-NACL 20-0.9 MG/250ML-% IV SOLN
INTRAVENOUS | Status: DC | PRN
  Administered 2022-05-09: 35 ug/min via INTRAVENOUS

## 2022-05-09 MED ORDER — ONDANSETRON HCL 4 MG/2ML IJ SOLN
INTRAMUSCULAR | Status: AC
  Filled 2022-05-09: qty 2

## 2022-05-09 MED ORDER — ROCURONIUM BROMIDE 10 MG/ML (PF) SYRINGE
PREFILLED_SYRINGE | INTRAVENOUS | Status: AC
  Filled 2022-05-09: qty 20

## 2022-05-09 MED ORDER — INSULIN ASPART 100 UNIT/ML IJ SOLN
0.0000 [IU] | INTRAMUSCULAR | Status: DC
  Administered 2022-05-09 – 2022-05-16 (×17): 2 [IU] via SUBCUTANEOUS
  Administered 2022-05-17: 4 [IU] via SUBCUTANEOUS
  Administered 2022-05-17 (×6): 2 [IU] via SUBCUTANEOUS
  Administered 2022-05-18: 4 [IU] via SUBCUTANEOUS
  Administered 2022-05-18 (×4): 2 [IU] via SUBCUTANEOUS
  Administered 2022-05-19: 4 [IU] via SUBCUTANEOUS
  Administered 2022-05-19 (×3): 2 [IU] via SUBCUTANEOUS
  Administered 2022-05-20 (×2): 4 [IU] via SUBCUTANEOUS
  Administered 2022-05-20 – 2022-05-28 (×18): 2 [IU] via SUBCUTANEOUS
  Administered 2022-05-29: 4 [IU] via SUBCUTANEOUS
  Administered 2022-05-29: 2 [IU] via SUBCUTANEOUS
  Administered 2022-05-30: 4 [IU] via SUBCUTANEOUS
  Administered 2022-05-30 – 2022-06-02 (×10): 2 [IU] via SUBCUTANEOUS
  Administered 2022-06-02: 4 [IU] via SUBCUTANEOUS
  Administered 2022-06-03 – 2022-06-04 (×8): 2 [IU] via SUBCUTANEOUS
  Administered 2022-06-05: 4 [IU] via SUBCUTANEOUS

## 2022-05-09 MED ORDER — ROCURONIUM BROMIDE 10 MG/ML (PF) SYRINGE
PREFILLED_SYRINGE | INTRAVENOUS | Status: AC
  Filled 2022-05-09: qty 10

## 2022-05-09 MED ORDER — EPINEPHRINE PF 1 MG/ML IJ SOLN
INTRAMUSCULAR | Status: AC
  Filled 2022-05-09: qty 1

## 2022-05-09 MED ORDER — SODIUM CHLORIDE 0.9 % IV SOLN: INTRAVENOUS | Status: DC | PRN

## 2022-05-09 MED ORDER — DEXTROSE 5 % IV SOLN
INTRAVENOUS | Status: DC | PRN
  Administered 2022-05-09: 3 g via INTRAVENOUS

## 2022-05-09 MED ORDER — ACETAMINOPHEN 160 MG/5ML PO SOLN
650.0000 mg | Freq: Four times a day (QID) | ORAL | Status: DC | PRN
  Administered 2022-05-14: 650 mg
  Filled 2022-05-09: qty 20.3

## 2022-05-09 MED ORDER — HYDRALAZINE HCL 20 MG/ML IJ SOLN
INTRAMUSCULAR | Status: AC
  Filled 2022-05-09: qty 1

## 2022-05-09 MED ORDER — SUGAMMADEX SODIUM 200 MG/2ML IV SOLN
INTRAVENOUS | Status: DC | PRN
  Administered 2022-05-09: 400 mg via INTRAVENOUS

## 2022-05-09 MED ORDER — KETOROLAC TROMETHAMINE 30 MG/ML IJ SOLN
INTRAMUSCULAR | Status: AC
  Filled 2022-05-09: qty 1

## 2022-05-09 SURGICAL SUPPLY — 141 items
ADH SKN CLS APL DERMABOND .7 (GAUZE/BANDAGES/DRESSINGS) ×2
APL PRP STRL LF DISP 70% ISPRP (MISCELLANEOUS) ×1
APPLIER CLIP ROT 10 11.4 M/L (STAPLE)
APR CLP MED LRG 11.4X10 (STAPLE)
BAG SPEC RTRVL LRG 6X4 10 (ENDOMECHANICALS)
BLADE CLIPPER SURG (BLADE) ×2 IMPLANT
BLADE SURG 11 STRL SS (BLADE) ×2 IMPLANT
BUTTON OLYMPUS DEFENDO 5 PIECE (MISCELLANEOUS) ×2 IMPLANT
CANISTER SUCT 3000ML PPV (MISCELLANEOUS) ×2 IMPLANT
CATH THORACIC 28FR (CATHETERS) IMPLANT
CATH THORACIC 28FR RT ANG (CATHETERS) IMPLANT
CATH THORACIC 36FR (CATHETERS) IMPLANT
CATH THORACIC 36FR RT ANG (CATHETERS) IMPLANT
CATH TROCAR 20FR (CATHETERS) IMPLANT
CHLORAPREP W/TINT 26 (MISCELLANEOUS) ×2 IMPLANT
CLIP APPLIE ROT 10 11.4 M/L (STAPLE) IMPLANT
CLIP VESOCCLUDE MED 6/CT (CLIP) ×2 IMPLANT
CNTNR URN SCR LID CUP LEK RST (MISCELLANEOUS) ×4 IMPLANT
CONN ST 1/4X3/8  BEN (MISCELLANEOUS)
CONN ST 1/4X3/8 BEN (MISCELLANEOUS) IMPLANT
CONN Y 3/8X3/8X3/8  BEN (MISCELLANEOUS)
CONN Y 3/8X3/8X3/8 BEN (MISCELLANEOUS) IMPLANT
CONT SPEC 4OZ STRL OR WHT (MISCELLANEOUS) ×4
COVER SURGICAL LIGHT HANDLE (MISCELLANEOUS) IMPLANT
DEFOGGER SCOPE WARMER CLEARIFY (MISCELLANEOUS) ×2 IMPLANT
DERMABOND ADVANCED .7 DNX12 (GAUZE/BANDAGES/DRESSINGS) ×2 IMPLANT
DEVICE SUTURE ENDOST 10MM (ENDOMECHANICALS) ×2 IMPLANT
DISSECTOR BLUNT TIP ENDO 5MM (MISCELLANEOUS) IMPLANT
DRAIN CHANNEL 19F RND (DRAIN) IMPLANT
DRAIN CHANNEL 28F RND 3/8 FF (WOUND CARE) IMPLANT
DRAIN CHANNEL 32F RND 10.7 FF (WOUND CARE) IMPLANT
DRAPE INCISE IOBAN 66X45 STRL (DRAPES) IMPLANT
DRAPE LAPAROSCOPIC ABDOMINAL (DRAPES) ×2 IMPLANT
DRAPE WARM FLUID 44X44 (DRAPES) ×2 IMPLANT
ELECT BLADE 6.5 EXT (BLADE) ×2 IMPLANT
ELECT REM PT RETURN 9FT ADLT (ELECTROSURGICAL) ×2
ELECTRODE REM PT RTRN 9FT ADLT (ELECTROSURGICAL) ×2 IMPLANT
EVACUATOR SILICONE 100CC (DRAIN) IMPLANT
FORCEPS GRASP COMBO 8X230 (FORCEP) IMPLANT
GAUZE 4X4 16PLY ~~LOC~~+RFID DBL (SPONGE) ×2 IMPLANT
GAUZE SPONGE 4X4 12PLY STRL (GAUZE/BANDAGES/DRESSINGS) ×2 IMPLANT
GLOVE BIO SURGEON STRL SZ7 (GLOVE) ×4 IMPLANT
GLOVE BIO SURGEON STRL SZ7.5 (GLOVE) ×2 IMPLANT
GOWN STRL REUS W/ TWL LRG LVL3 (GOWN DISPOSABLE) ×4 IMPLANT
GOWN STRL REUS W/ TWL XL LVL3 (GOWN DISPOSABLE) ×2 IMPLANT
GOWN STRL REUS W/TWL LRG LVL3 (GOWN DISPOSABLE) ×4
GOWN STRL REUS W/TWL XL LVL3 (GOWN DISPOSABLE) ×2
GRASPER SUT TROCAR 14GX15 (MISCELLANEOUS) ×2 IMPLANT
GUIDEWIRE STIFF .035X260 (MISCELLANEOUS) IMPLANT
HEMOSTAT SURGICEL 2X14 (HEMOSTASIS) IMPLANT
IRRIGATION STRYKERFLOW (MISCELLANEOUS) IMPLANT
IRRIGATOR STRYKERFLOW (MISCELLANEOUS) ×2
KIT BASIN OR (CUSTOM PROCEDURE TRAY) ×2 IMPLANT
KIT DILATOR VASC 18G NDL (KITS) ×2 IMPLANT
KIT SUCTION CATH 14FR (SUCTIONS) IMPLANT
KIT TUBE JEJUNAL 16FR (CATHETERS) IMPLANT
KIT TURNOVER KIT B (KITS) ×2 IMPLANT
MARKER SKIN DUAL TIP RULER LAB (MISCELLANEOUS) ×2 IMPLANT
NDL 22X1.5 STRL (OR ONLY) (MISCELLANEOUS) ×2 IMPLANT
NDL SPNL 18GX3.5 QUINCKE PK (NEEDLE) IMPLANT
NEEDLE 22X1.5 STRL (OR ONLY) (MISCELLANEOUS) ×2 IMPLANT
NEEDLE SPNL 18GX3.5 QUINCKE PK (NEEDLE) IMPLANT
NS IRRIG 1000ML POUR BTL (IV SOLUTION) ×6 IMPLANT
OBTURATOR OPTICAL STANDARD 8MM (TROCAR) ×2
OBTURATOR OPTICAL STND 8 DVNC (TROCAR) ×2
OBTURATOR OPTICALSTD 8 DVNC (TROCAR) IMPLANT
OIL SILICONE PENTAX (PARTS (SERVICE/REPAIRS)) IMPLANT
PACK CHEST (CUSTOM PROCEDURE TRAY) ×2 IMPLANT
PACK LAPAROSCOPY I 1258 (SET/KITS/TRAYS/PACK) ×2 IMPLANT
PACK UNIVERSAL I (CUSTOM PROCEDURE TRAY) ×2 IMPLANT
PAD ARMBOARD 7.5X6 YLW CONV (MISCELLANEOUS) ×4 IMPLANT
POUCH ENDO CATCH II 15MM (MISCELLANEOUS) IMPLANT
POUCH SPECIMEN RETRIEVAL 10MM (ENDOMECHANICALS) IMPLANT
RELOAD ENDO STITCH (ENDOMECHANICALS) ×16 IMPLANT
RELOAD SUT TRIPLE-STITCH 2-0 (ENDOMECHANICALS) ×16 IMPLANT
RETRACTOR WOUND ALXS 19CM XSML (INSTRUMENTS) IMPLANT
RTRCTR WOUND ALEXIS 19CM XSML (INSTRUMENTS)
SCISSORS ENDO CVD 5DCS (MISCELLANEOUS) ×2 IMPLANT
SCISSORS LAP 5X35 DISP (ENDOMECHANICALS) IMPLANT
SEAL CANN UNIV 5-8 DVNC XI (MISCELLANEOUS) IMPLANT
SEAL XI 5MM-8MM UNIVERSAL (MISCELLANEOUS) ×2
SEALANT PROGEL (MISCELLANEOUS) IMPLANT
SEALANT SURG COSEAL 4ML (VASCULAR PRODUCTS) IMPLANT
SEALANT SURG COSEAL 8ML (VASCULAR PRODUCTS) IMPLANT
SEALER LIGASURE MARYLAND 30 (ELECTROSURGICAL) ×2 IMPLANT
SET GASTRO INIT PLACEMENT MED (SET/KITS/TRAYS/PACK) IMPLANT
SET IRRIG TUBING LAPAROSCOPIC (IRRIGATION / IRRIGATOR) IMPLANT
SLEEVE ENDOPATH XCEL 5M (ENDOMECHANICALS) ×2 IMPLANT
SPECIMEN JAR MEDIUM (MISCELLANEOUS) IMPLANT
SPONGE INTESTINAL PEANUT (DISPOSABLE) ×4 IMPLANT
SPONGE T-LAP 18X18 ~~LOC~~+RFID (SPONGE) ×8 IMPLANT
SPONGE TONSIL TAPE 1 RFD (DISPOSABLE) ×2 IMPLANT
STAPLER ENDO GIA 12 SHRT THIN (STAPLE) ×2 IMPLANT
STAPLER ENDO GIA 12MM SHORT (STAPLE) ×2 IMPLANT
STENT WALLFLEX ESOPH 23X125MM (Stent) IMPLANT
STOPCOCK 4 WAY LG BORE MALE ST (IV SETS) ×2 IMPLANT
SUT ETHILON 1 TP 1 60 (SUTURE) IMPLANT
SUT ETHILON 3 0 PS 1 (SUTURE) IMPLANT
SUT MNCRL AB 3-0 PS2 18 (SUTURE) IMPLANT
SUT MNCRL AB 4-0 PS2 18 (SUTURE) ×4 IMPLANT
SUT MON AB 2-0 CT1 36 (SUTURE) IMPLANT
SUT PDS AB 1 CTX 36 (SUTURE) IMPLANT
SUT PROLENE 4 0 RB 1 (SUTURE)
SUT PROLENE 4-0 RB1 .5 CRCL 36 (SUTURE) IMPLANT
SUT SILK  1 MH (SUTURE) ×4
SUT SILK 1 MH (SUTURE) ×2 IMPLANT
SUT SILK 1 TIES 10X30 (SUTURE) ×2 IMPLANT
SUT SILK 2 0 SH (SUTURE) IMPLANT
SUT SILK 2 0SH CR/8 30 (SUTURE) IMPLANT
SUT VIC AB 1 CTX 36 (SUTURE)
SUT VIC AB 1 CTX36XBRD ANBCTR (SUTURE) IMPLANT
SUT VIC AB 2-0 CT1 27 (SUTURE) ×4
SUT VIC AB 2-0 CT1 TAPERPNT 27 (SUTURE) ×4 IMPLANT
SUT VIC AB 3-0 SH 27 (SUTURE) ×6
SUT VIC AB 3-0 SH 27X BRD (SUTURE) ×2 IMPLANT
SUT VIC AB 3-0 X1 27 (SUTURE) IMPLANT
SUT VICRYL 0 UR6 27IN ABS (SUTURE) ×4 IMPLANT
SUT VICRYL 2 TP 1 (SUTURE) IMPLANT
SYR 10ML LL (SYRINGE) ×2 IMPLANT
SYR 20ML ECCENTRIC (SYRINGE) ×2 IMPLANT
SYR 30ML LL (SYRINGE) ×2 IMPLANT
SYR 50ML LL SCALE MARK (SYRINGE) ×2 IMPLANT
SYSTEM SAHARA CHEST DRAIN ATS (WOUND CARE) ×2 IMPLANT
TAPE CLOTH 4X10 WHT NS (GAUZE/BANDAGES/DRESSINGS) ×2 IMPLANT
TAPE CLOTH SURG 4X10 WHT LF (GAUZE/BANDAGES/DRESSINGS) IMPLANT
TAPE CLOTH SURG 6X10 WHT LF (GAUZE/BANDAGES/DRESSINGS) IMPLANT
TIP APPLICATOR SPRAY EXTEND 16 (VASCULAR PRODUCTS) IMPLANT
TOWEL GREEN STERILE (TOWEL DISPOSABLE) ×2 IMPLANT
TOWEL GREEN STERILE FF (TOWEL DISPOSABLE) ×2 IMPLANT
TRAY FOLEY MTR SLVR 16FR STAT (SET/KITS/TRAYS/PACK) ×2 IMPLANT
TROCAR XCEL 12X100 BLDLESS (ENDOMECHANICALS) ×2 IMPLANT
TROCAR XCEL BLADELESS 5X75MML (TROCAR) ×2 IMPLANT
TROCAR XCEL NON-BLD 5MMX100MML (ENDOMECHANICALS) ×2 IMPLANT
TUBE CONNECTING 20X1/4 (TUBING) ×2 IMPLANT
TUBE J 18FR (TUBING) IMPLANT
TUBING ENDO SMARTCAP (MISCELLANEOUS) ×2 IMPLANT
TUBING EXTENTION W/L.L. (IV SETS) ×2 IMPLANT
TUBING LAP HI FLOW INSUFFLATIO (TUBING) ×2 IMPLANT
UNDERPAD 30X36 HEAVY ABSORB (UNDERPADS AND DIAPERS) ×2 IMPLANT
WATER STERILE IRR 1000ML POUR (IV SOLUTION) ×2 IMPLANT
WIRE EMERALD 3MM-J .035X150CM (WIRE) ×2 IMPLANT

## 2022-05-09 NOTE — Progress Notes (Signed)
ABG attempted 2x unsuccessfully. Will ask another RT to attempt.

## 2022-05-09 NOTE — Anesthesia Postprocedure Evaluation (Signed)
Anesthesia Post Note  Patient: Keith Moreno  Procedure(s) Performed: LAPAROSCOPIC JEJUNOSTOMY ESOPHAGOGASTRODUODENOSCOPY (EGD) VIDEO ASSISTED THORACOSCOPY (Right)     Patient location during evaluation: PACU Anesthesia Type: General Level of consciousness: awake Pain management: pain level controlled Respiratory status: spontaneous breathing Cardiovascular status: stable Postop Assessment: no apparent nausea or vomiting Anesthetic complications: no   No notable events documented.  Last Vitals:  Vitals:   05/09/22 1210 05/09/22 1215  BP: (!) 165/112 (!) 166/146  Pulse: 70 72  Resp: (!) 33 (!) 30  Temp:    SpO2: 95% 95%    Last Pain:  Vitals:   05/09/22 1145  TempSrc:   PainSc: 6                  Ishitha Roper

## 2022-05-09 NOTE — Anesthesia Procedure Notes (Signed)
Arterial Line Insertion Start/End10/31/2023 7:15 AM, 05/09/2022 7:25 AM Performed by: Inda Coke, CRNA, CRNA  Patient location: Pre-op. Preanesthetic checklist: patient identified, IV checked, site marked, risks and benefits discussed, surgical consent, monitors and equipment checked, pre-op evaluation, timeout performed and anesthesia consent Lidocaine 1% used for infiltration Right, radial was placed Catheter size: 20 G Hand hygiene performed  and maximum sterile barriers used  Allen's test indicative of satisfactory collateral circulation Attempts: 3 Procedure performed without using ultrasound guided technique. Following insertion, dressing applied and Biopatch. Post procedure assessment: normal  Post procedure complications: second provider assisted. Patient tolerated the procedure well with no immediate complications.

## 2022-05-09 NOTE — Progress Notes (Signed)
Pts HR irregular on monitor. Dr. Carlota Raspberry notified, EKG ordered

## 2022-05-09 NOTE — Progress Notes (Signed)
Radiology calls with report impression. Asked [place] to notify [name] of results with CT of chest recommendation.  Paged [name].

## 2022-05-09 NOTE — Anesthesia Procedure Notes (Signed)
Procedure Name: Intubation Date/Time: 05/09/2022 7:52 AM  Performed by: Inda Coke, CRNAPre-anesthesia Checklist: Patient identified, Emergency Drugs available, Suction available, Timeout performed and Patient being monitored Patient Re-evaluated:Patient Re-evaluated prior to induction Oxygen Delivery Method: Circle system utilized Preoxygenation: Pre-oxygenation with 100% oxygen Induction Type: IV induction Ventilation: Mask ventilation without difficulty Laryngoscope Size: Mac and 4 Grade View: Grade I Tube type: Oral Tube size: 7.5 mm Number of attempts: 1 Airway Equipment and Method: Stylet Placement Confirmation: ETT inserted through vocal cords under direct vision, positive ETCO2, CO2 detector and breath sounds checked- equal and bilateral Secured at: 23 cm Tube secured with: Tape Dental Injury: Teeth and Oropharynx as per pre-operative assessment

## 2022-05-09 NOTE — Progress Notes (Signed)
SLP Cancellation Note  Patient Details Name: Keith Moreno MRN: [id number] DOB: 26-Feb-1961   Cancelled treatment:        Esophagram revealed "immediate large volume leak of contrast at the upper gastroesophageal anastomosis." Pt in surgery today to address and for re-placement of J tube.  SLP will sign off at this time.  If there is concern for pharyngeal dysphagia following surgical intervention after pt is cleared for POs, please re-consult speech therapy at that time.                                                                                                 Celedonio Savage, [place], [place] Office: [phone] 05/09/2022, 9:16 AM

## 2022-05-09 NOTE — Progress Notes (Signed)
     [address]       [place] [address]             [phone]       No events OR today for Esophageal stenting, and replacement of J tube  Hisayo Delossantos O Gayathri Futrell

## 2022-05-09 NOTE — Op Note (Signed)
[address]       ,[place] [address]             [phone]        05/09/2022  Patient:  Keith Moreno Pre-Op Dx: History of a robotic assisted Ivor Lewis esophagectomy   Anastomotic leak   Extravasation at the pyloroplasty site.   Severe protein malnutrition.   Morbid obesity Post-op Dx: Same Procedure: - Esophagogastroscopy -Placement of 125 mm esophageal stent -Diagnostic laparoscopy with placement of a jejunostomy tube -Right thoracoscopy with placement of a 19 French Blake chest tube.   Surgeon and Role:      * Jaree Dwight, Lucile Crater, MD - Primary Assistant: Josie Saunders, PA-C  Anesthesia  general EBL: 15 ml Blood Administration: 1 unit of packed red blood cells Specimen: None   Counts: correct   Indications: 61yo male s/p Robotic assisted Ivor Lewis esophagectomy 1 month ago now with extravasation at the anastomosis, and pyloroplasty site.  Clinically stable.  Will plan for EGD, esophageal stent placement, replacement of the jejunostomy tube, and R VATS, chest tube placemen   Findings: There was a focal perforation at the anastomotic site.  The V-Loc suture was evident.  The gastric conduit was viable and healthy.  I was unable to find the perforation the pyloroplasty.  The J-tube site was used for replacement of the feeding tube.  The small bowel was still tacked to the abdominal wall.  I was unable to mobilize the liver safely off of the hiatus.  A drain was placed in that area.  Operative Technique: After the risks, benefits and alternatives were thoroughly discussed, the patient was brought to the operative theatre.  Anesthesia was induced. The patient was prepped and draped in normal sterile fashion.  An appropriate surgical pause was performed, and pre-operative antibiotics were dosed accordingly.  The gastroscope was advanced through the oropharynx into the cervical esophagus under direct visualization.  The scope was passed into  the stomach.  The scope was then pulled back, and the esophageal mucosa was visualized.  A mucosal defect was noted at at the anastomotic site.  There is a small local perforation leading into a contained cavity.   This area was marked on the skin with paper clips.  Next a Jag wire was passed through the gastroscope into the stomach with fluoroscopic guidance.  The esophageal stent was then placed over the wire, and positioned under fluoroscopy to cover the mucosal defect.    We next prepped and draped the abdomen in standard fashion.  We entered the abdominal cavity using the Optiview.  The previous incisions reuses port sites.  The abdomen was then insufflated to 15 mmHg.  The intestine was tacked up in the previous site for the jejunostomy tube.  We then introduced a needle into the abdominal wall and passed it directly into the small intestine.  We confirmed its intraluminal position by insufflating air, wire was then passed into the distal small bowel.  This tract was then sequentially dilated and a 16 French jejunostomy tube was then placed.  The balloon was inflated and this was pulled up to the abdominal wall.  I then focused our attention to the hiatus.  There was omentum that was lodged in this area.  I was unable to mobilize the liver off of this area safely.  There is no evidence of any succus or frank infection.  I then placed a 87 [place] Blake drain under the liver.  The pneumoperitoneum was then released and all port sites were removed.  The skin was closed with absorbable suture.  The patient was then placed in a left lateral decubitus position.  The previous port sites were used to gain access into the pleural space.  The pleural space was then insufflated to 8 mmHg.  I mobilized the lung off of the posterior chest wall and gutter.  There is a small amount of fluid but this was mostly serous that was noted.  Then placed a 26 [place] Blake drain in the posterior gutter.  The port sites were all  removed and we visualized the lung fully expand.  The skin and soft tissue were closed with resorbable suture.  The patient tolerated the procedure without any immediate complications, and was transferred to the PACU in stable condition.  [name]

## 2022-05-09 NOTE — Progress Notes (Signed)
Patient transferred to OR via bed with tele monitoring. CCMD notified. Report given to Rejeana Brock CRNA. Notified pt had brief afib this am, and HR had dropped to non sustained 30-40's couple times. Pt non symptomatic throughout night. Vitals stable.Connected pt to their monitor., 2l 02 appled via [place]. Wife waiting in waiting area.

## 2022-05-09 NOTE — Discharge Summary (Signed)
[address]       [place] [address]             [phone]    Physician Discharge Summary  Patient ID: Keith Moreno MRN: [id number] DOB/AGE: 03/31/1961 61 y.o.  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[facility])    Low back pain 11/01/2021   Discharge Diagnoses:  Patient Active Problem List   Diagnosis Date Noted   Mediastinitis 05/30/2022   Pressure injury of skin 05/09/2022   Esophageal anastomotic leak 05/09/2022   Hypercalcemia 05/05/2022   History of esophagectomy 05/05/2022   Dysphagia 05/05/2022   Hypertension    Nocturnal hypoxia    Malnutrition of moderate degree 04/06/2022   Esophageal cancer ([facility]) 04/05/2022   Port-A-Cath in place 12/26/2021   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 male was readmitted to the hospitalist service secondary to failure to thrive and dysphagia.  He underwent a robotic assisted Ivor Lewis esophagectomy, pyloroplasty, and laparoscopic feeding jejunostomy on 04/05/2022.   He was discharged tolerating a soft mechanical diet.  He underwent an esophagram 05/08/2022 which showed extravasation at the GE anastamosis, and the diaphragm. Dr.Lightfoot discussed the need for EGD,  esophageal stent, laparoscopic feeding jejunostomy placement, right VATS and placement of right chest tube. Potential risks,benefits, and complications of the surgery were discussed with the patient and he agreed to proceed with surgery.  Hospital Course: He underwent an EGD, esophageal stent, laparoscopic feeding jejunostomy placement, placement of drain (under liver), right VATS and placement of right chest tube. He was extubated and transferred from the OR to PACU in stable condition. A line was removed. Post op CXR showed no pneumothorax, low lung volumes, and obscured left hemidiaphragm (? pleural effusion and airspace disease). He was NPO, given IVF, and slowly started of tube feedings. He was put on Lovenox for DVT prophylaxis. He was put on Zosyn and Difulcan IV as with esophageal leak.  PT and OT have been consulted to assist with ongoing rehabilitation needs.  Nutritional needs continue to be addressed at he will require J-tube feedings for at least 6 months prior to initiating oral feedings.  He had some hypertension resulting from being unable to take his usual oral medications. This was managed with PRN IV hydralazine then changed to a scheduled clonidine patch. He was tolerating tube feedings at a rate of 70 ml/hr on 05/15/2022. He was requiring 2 liters of oxygen but was then weaned to room air. Abdominal drain was pulled back and mostly out so this was removed on 05/16/2022. BP was then better controlled with scheduled IV Hydralazine and Clonidine patch was decreased. He was on Lisinopril and Amlodipine prior to surgery but unable to resume until able to tolerate po.   The patient was hypercalcemic.  Pharmacy consult was obtained and they recommended Calcitonin injection for 4 doses and Zometa IV dose.  The patient developed worsening hypercalcemia and associated hypernatremia.  It was felt he may not be sensitive to Calcitonin and his dose was increased.  The hypernatremia is felt to be due to  dehydration.  His IV fluids were increased to 200 ml/hr. He also developed acute AKI and creatinine went up to 2.46 on 11/13. J tube became clogged on 11/12. Nurse attempted to unclog;IR was consulted to 11/13 to assist. Eugenio Hoes valve removed and tube flushed aggressively in a pulsating fashion with a christmas tree adapter and normal saline flush until resistance was no longer felt  Care order was placed to flush J tube every time (with at least 20 cc of normal saline) medications given. Renal function and hypercalcemia improved. Creatinine was 2.33 and calcium was 11 on 11/14; however, sodium was up to 157. Per [name], decreased fluid to 75 ml/hr and increased frequency of free water. Nutrition asked to evaluate. Hypercalcemia resolved as calcium down to 10.2 on 11/15 and creatinine decreased to 2.1. IVF changed from 0.9% saline to 0.45% saline as sodium up to 159. Labs continued to improve as of 11/20, sodium is down to 145 and creatinine down to 1.18. Calcium was 8.2 so supplemented. Esophagram done 11/20 showed continued leak.  The patient will remain NPO and continue tube feeds.  ID consult was obtained and recommended 4 weeks of oral Augmentin.  He will be transitioned to Metformin solution for diabetes control.  If this does not require adequate control the patient may require insulin.  He had a virtual evaluation with OT department through the [facility].  They are agreeable to hospital bed. All home arrangements have been made.  He is medically stable for discharge home today.  Consults: None  Significant Diagnostic Studies:   Narrative & Impression  CLINICAL DATA:  Difficulty swallowing food. Recent esophagectomy gastric pull-through.   EXAM: ESOPHAGUS/BARIUM SWALLOW/TABLET STUDY   TECHNIQUE: Single contrast examination was performed using thin liquid barium. This exam was performed by Brynda Greathouse PA and Lorin Picket, MD, and was supervised and interpreted by Lorin Picket, MD.    FLUOROSCOPY: Radiation Exposure Index (as provided by the fluoroscopic device): 4.8 mGy Kerma   COMPARISON:  CT chest abdomen pelvis 04/18/2022, esophagram 04/10/2022.   FINDINGS: Swallowing: Not assessed.   Pharynx: Not assessed.   Esophagus: Ivor Lewis esophagectomy with gastric pull-through. There is immediate large volume leak of contrast at the upper gastroesophageal anastomosis. Contrast flows into the stomach, stagnating in the distal stomach, below the diaphragmatic hiatus. Contrast administration was terminated, limiting the evaluation for additional leak below the diaphragmatic hiatus.   Esophageal motility: Not assessed.   Hiatal Hernia: N/A.   Gastroesophageal reflux: The assessed.   Ingested 72m barium tablet: Not assessed.   Other: None.   IMPRESSION: 1. Ivor Lewis esophagectomy with gastric pull-through. Immediate large volume leak of contrast at the upper gastroesophageal anastomosis. 2. Study was terminated due to gastroesophageal leak but additional imaging assessment was performed, showing stasis of contrast in the distal stomach. Evaluation for a leak below the diaphragmatic hiatus is therefore limited.   Electronically Signed: By: MLorin PicketM.D. On: 05/08/2022 11:12   Narrative & Impression  CLINICAL DATA:  Recent esophagectomy. Pleural effusion. Shortness of breath.   EXAM: PORTABLE CHEST 1 VIEW   COMPARISON:  05/10/2022   FINDINGS: Support apparatus: Right Port-A-Cath tip is difficult to follow centrally, likely terminating at the  superior caval/atrial junction. The right-sided chest tube is unchanged in position. Numerous leads and wires project over the chest. Gastrointestinal stent projects over the right side of the thorax.   Heart/mediastinum: Midline trachea. Mild cardiomegaly. Soft tissue fullness about the right-side of the mediastinum is likely related to gastric pull-through anatomy.   Pleura: No pneumothorax.  Possible residual small left pleural effusion.   Lungs: No congestive failure. Improved to resolved interstitial edema. Persistent left base airspace disease.   Other: None   IMPRESSION: Improved aeration with improved to resolved interstitial edema and nearly resolved left pleural effusion.   Slightly improved left base opacity, favoring atelectasis.   No pneumothorax.     Electronically Signed   By: Abigail Miyamoto M.D.   On: 05/17/2022 08:46    Treatments: surgery:   Esophagogastroscopy -Placement of 125 mm esophageal stent -Diagnostic laparoscopy with placement of a jejunostomy tube -Right thoracoscopy with placement of a 19 French Blake chest tube by [name] on 05/09/2022.    Discharge Exam: Blood pressure (!) 147/75, pulse 87, temperature 98.3 F (36.8 C), temperature source Oral, resp. rate 14, height 6' (1.829 m), weight (!) 145.4 kg, SpO2 97 %. Alert, NAD Lungs clear Cardiac RRR Abd Obese, benign Incisions healing well   Discharge Medications:  Discharge Instructions     Discharge patient   Complete by: As directed    Discharge disposition: 01-Home or Self Care   Discharge patient date: 06/05/2022      Allergies as of 06/05/2022       Reactions   Procaine    Other reaction(s): Other, Respiratory Distress   Chocolate    Hyperactivity, respiratory distress   Other    Novocaine- respiratory distress        Medication List     STOP taking these medications    acetaminophen 325 MG tablet Commonly known as: TYLENOL Replaced by: acetaminophen 160 MG/5ML solution   Alogliptin Benzoate 25 MG Tabs   amLODipine 10 MG tablet Commonly known as: NORVASC   B-12 PO   CINNAMON PO   FERROUS SULFATE PO   lidocaine 5 % Commonly known as: LIDODERM   lidocaine-prilocaine cream Commonly known as: EMLA   lisinopril 40 MG tablet Commonly known as: ZESTRIL   Melatonin 10 MG Tabs   multivitamin with minerals Tabs tablet   pantoprazole  40 MG tablet Commonly known as: PROTONIX   pioglitazone 30 MG tablet Commonly known as: ACTOS   vitamin C 1000 MG tablet   Vitamin D3 50 MCG (2000 UT) capsule   vitamin E 180 MG (400 UNITS) capsule       TAKE these medications    acetaminophen 160 MG/5ML solution Commonly known as: TYLENOL Place 20.3 mLs (650 mg total) into feeding tube every 6 (six) hours as needed for mild pain or headache. Replaces: acetaminophen 325 MG tablet   amoxicillin-clavulanate 400-57 MG/5ML suspension Commonly known as: AUGMENTIN Place 10.9 mLs (872 mg total) into feeding tube every 12 (twelve) hours.   cloNIDine 0.1 mg/24hr patch Commonly known as: CATAPRES - Dosed in mg/24 hr Place 1 patch (0.1 mg total) onto the skin once a week.   feeding supplement (OSMOLITE 1.5 CAL) Liqd Place 1,710 mLs into feeding tube daily. What changed:  how much to take how to take this when to take this   guaiFENesin 100 MG/5ML liquid Commonly known as: ROBITUSSIN Place 15 mLs into feeding tube every 4 (four) hours as needed for cough or to loosen phlegm.   HYDROcodone-acetaminophen  7.5-325 mg/15 ml solution Commonly known as: HYCET Place 10 mLs into feeding tube every 6 (six) hours as needed for moderate pain.   lansoprazole 30 MG capsule Commonly known as: Prevacid Take 1 capsule (30 mg total) by mouth 2 (two) times daily before a meal. Sprinkle capsule into water and place in JP tube, then flush with more water   Metformin HCl 500 MG/5ML Soln Place 5 mLs (500 mg total) into feeding tube 2 (two) times daily.   metoprolol tartrate 25 mg/10 mL Susp Commonly known as: LOPRESSOR Place 5 mLs (12.5 mg total) into feeding tube 2 (two) times daily.   Normal Saline Flush 0.9 % Soln 30 mLs by Per J Tube route every 8 (eight) hours.   olopatadine 0.1 % ophthalmic solution Commonly known as: PATANOL Place 1 drop into both eyes 2 (two) times daily.   ondansetron 8 MG disintegrating tablet Commonly known  as: ZOFRAN-ODT Take 1 tablet (8 mg total) by mouth every 8 (eight) hours as needed for nausea or vomiting.   Zinc Oxide 12.8 % ointment Commonly known as: TRIPLE PASTE Apply topically 2 (two) times daily. To J-Tube site        Follow-up Information     Lajuana Matte, MD. Go on 06/09/2022.   Specialty: Cardiothoracic Surgery Why: Appointment is at 10:20 am Contact information: [place] [address] [phone]                 Signed:  [name], PA-C 06/06/2022, 8:49 AM

## 2022-05-09 NOTE — Progress Notes (Signed)
Patient had irregular rythm in the monitor that showed Afib. Called CCMD and verified. 12 lead EKG obtained twice but results didn't transferred over to epic, placed in the chart. On call triad MD notified, before notifying CT surgery, pt converted back. Another 12 lead Obtained, that showed wide QRS rhythm. Vitals stable, patient denies any pain or discomfort. Plan of care continues.

## 2022-05-09 NOTE — Hospital Course (Addendum)
HPI: This is a 61 y.o. male was readmitted to the hospitalist service secondary to failure to thrive and dysphagia.  He underwent a robotic assisted Ivor Lewis esophagectomy, pyloroplasty, and laparoscopic feeding jejunostomy on 04/05/2022.   He was discharged tolerating a soft mechanical diet.  He underwent an esophagram 05/08/2022 which showed extravasation at the GE anastamosis, and the diaphragm. Dr.Lightfoot discussed the need for EGD, esophageal stent, laparoscopic feeding jejunostomy placement, right VATS and placement of right chest tube. Potential risks,benefits, and complications of the surgery were discussed with the patient and he agreed to proceed with surgery.  Hospital Course: He underwent an EGD, esophageal stent, laparoscopic feeding jejunostomy placement, placement of drain (under liver), right VATS and placement of right chest tube. He was extubated and transferred from the OR to PACU in stable condition. A line was removed. Post op CXR showed no pneumothorax, low lung volumes, and obscured left hemidiaphragm (? pleural effusion and airspace disease). He was NPO, given IVF, and slowly started of tube feedings. He was put on Lovenox for DVT prophylaxis. He was put on Zosyn and Difulcan IV as with esophageal leak.  PT and OT have been consulted to assist with ongoing rehabilitation needs.  Nutritional needs continue to be addressed at he will require J-tube feedings for at least 6 months prior to initiating oral feedings.  He had some hypertension resulting from being unable to take his usual oral medications. This was managed with PRN IV hydralazine then changed to a scheduled clonidine patch. He was tolerating tube feedings at a rate of 70 ml/hr on 05/15/2022. He was requiring 2 liters of oxygen but was then weaned to room air. Abdominal drain was pulled back and mostly out so this was removed on 05/16/2022. BP was then better controlled with scheduled IV Hydralazine and Clonidine patch was  decreased. He was on Lisinopril and Amlodipine prior to surgery but unable to resume until able to tolerate po.   The patient was hypercalcemic.  Pharmacy consult was obtained and they recommended Calcitonin injection for 4 doses and Zometa IV dose.  The patient developed worsening hypercalcemia and associated hypernatremia.  It was felt he may not be sensitive to Calcitonin and his dose was increased.  The hypernatremia is felt to be due to dehydration.  His IV fluids were increased to 200 ml/hr. He also developed acute AKI and creatinine went up to 2.46 on 11/13. J tube became clogged on 11/12. Nurse attempted to unclog;IR was consulted to 11/13 to assist. Eugenio Hoes valve removed and tube flushed aggressively in a pulsating fashion with a christmas tree adapter and normal saline flush until resistance was no longer felt  Care order was placed to flush J tube every time (with at least 20 cc of normal saline) medications given. Renal function and hypercalcemia improved. Creatinine was 2.33 and calcium was 11 on 11/14;however, sodium was up to 157. Per [name], decreased fluid to 75 ml/hr and increased frequency of free water. Nutrition asked to evaluate. Hypercalcemia resolved as calcium down to 10.2 on 11/15 and creatinine decreased to 2.1. IVF changed from 0.9% saline to 0.45% saline as sodium up to 159. Labs continued to improve as of 11/20, sodium is down to 145 and creatinine down to 1.18. Calcium was 8.2 so supplemented. Esophagram done 11/20 showed continued leak.  The patient will remain NPO and continue tube feeds.  ID consult was obtained and recommended 4 weeks of oral Augmentin.  He had a virtual evaluation with OT department through  the [place].  They recommended ***.

## 2022-05-09 NOTE — Progress Notes (Signed)
Pharmacy Antibiotic Note  Keith Moreno is a 61 y.o. male admitted on 05/05/2022 with aspiration pneumonia.  Pharmacy has been consulted for Zosyn dosing.  Plan: Zosyn 3.375g IV q8h (4 hour infusion). Monitor clinical progress, cultures/sensitivities, renal function, abx plan  Weight: (!) 144.6 kg (318 lb 12.8 oz)  Temp (24hrs), Avg:98.1 F (36.7 C), Min:96.7 F (35.9 C), Max:98.6 F (37 C)  Recent Labs  Lab 05/05/22 0957 05/06/22 0213 05/07/22 0306 05/08/22 0235 05/09/22 0311  WBC 5.5 4.1  --   --   --   CREATININE 1.13 1.22 1.00 1.10 0.98    Estimated Creatinine Clearance: 116.9 mL/min (by C-G formula based on SCr of 0.98 mg/dL).    Allergies  Allergen Reactions   Procaine     Other reaction(s): Other, Respiratory Distress   Chocolate     Hyperactivity, respiratory distress   Other     Novocaine- respiratory distress    Antimicrobials this admission: 10/31 Zosyn >>     Thank you for allowing [place] to participate in this patients care. Jens Som, PharmD 05/09/2022 12:45 PM  **Pharmacist phone directory can be found on [email or web address] listed under [place]**

## 2022-05-09 NOTE — Progress Notes (Signed)
Received a message from [name] that he is going to transfer this patient under his service due to patient needing surgery.  Hospitalist signing off.

## 2022-05-09 NOTE — Transfer of Care (Signed)
Immediate Anesthesia Transfer of Care Note  Patient: Keith Moreno  Procedure(s) Performed: LAPAROSCOPIC JEJUNOSTOMY ESOPHAGOGASTRODUODENOSCOPY (EGD) VIDEO ASSISTED THORACOSCOPY (Right)  Patient Location: PACU  Anesthesia Type:General  Level of Consciousness: awake and drowsy  Airway & Oxygen Therapy: Patient Spontanous Breathing and Patient connected to face mask oxygen  Post-op Assessment: Report given to RN and Post -op Vital signs reviewed and stable  Post vital signs: Reviewed and stable  Last Vitals:  Vitals Value Taken Time  BP 125/78 05/09/22 1145  Temp 35.9 C 05/09/22 1145  Pulse 66 05/09/22 1154  Resp 35 05/09/22 1154  SpO2 95 % 05/09/22 1154  Vitals shown include unvalidated device data.  Last Pain:  Vitals:   05/09/22 0343  TempSrc: Oral  PainSc:       Patients Stated Pain Goal: 0 (21/97/58 8325)  Complications: No notable events documented.

## 2022-05-09 NOTE — Brief Op Note (Signed)
05/05/2022 - 05/09/2022  9:04 AM  PATIENT:  Keith Moreno  61 y.o. male  PRE-OPERATIVE DIAGNOSIS:  1. Anastomotic leak 2. History of Xi assisted Ivor [employer], lap feeding J tube  POST-OPERATIVE DIAGNOSIS:  1. Anastomotic leak 2. History of Xi assisted Ivor [employer], lap feeding J tube  PROCEDURE:  ESOPHAGOGASTRODUODENOSCOPY (EGD),  ESOPHAGEAL STENT PLACEMENT, RIGHT VIDEO ASSISTED THORACOSCOPY,LAPAROSCOPIC JEJUNOSTOMY, and RIGHT VATS for RIGHT CHEST TUBE PLACEMENT  SURGEON:  Surgeon(s) and Role:    Lightfoot, Lucile Crater, MD - Primary  PHYSICIAN ASSISTANT: Lars Pinks PA-C  ANESTHESIA:   general  EBL:  Minimal  BLOOD ADMINISTERED:none  DRAINS:  2 19 Blake drains. 1 placed under the liver and 1 placed in the right pleural space    LOCAL MEDICATIONS USED:  OTHER Exparel  SPECIMEN:  No Specimen  COUNTS:  YES   DICTATION: .Dragon Dictation  PLAN OF CARE: Admit to inpatient   PATIENT DISPOSITION:  PACU - hemodynamically stable.   Delay start of Pharmacological VTE agent (>24hrs) due to surgical blood loss or risk of bleeding: no

## 2022-05-09 NOTE — Plan of Care (Signed)

## 2022-05-10 ENCOUNTER — Inpatient Hospital Stay (HOSPITAL_COMMUNITY): Payer: No Typology Code available for payment source

## 2022-05-10 ENCOUNTER — Ambulatory Visit: Payer: Self-pay | Admitting: Thoracic Surgery (Cardiothoracic Vascular Surgery)

## 2022-05-10 ENCOUNTER — Encounter (HOSPITAL_COMMUNITY): Payer: Self-pay | Admitting: Thoracic Surgery (Cardiothoracic Vascular Surgery)

## 2022-05-10 LAB — CBC
HCT: 29.9 % — ABNORMAL LOW (ref 39.0–52.0)
Hemoglobin: 9.4 g/dL — ABNORMAL LOW (ref 13.0–17.0)
MCH: 28.1 pg (ref 26.0–34.0)
MCHC: 31.4 g/dL (ref 30.0–36.0)
MCV: 89.5 fL (ref 80.0–100.0)
Platelets: 250 10*3/uL (ref 150–400)
RBC: 3.34 MIL/uL — ABNORMAL LOW (ref 4.22–5.81)
RDW: 14.2 % (ref 11.5–15.5)
WBC: 5.8 10*3/uL (ref 4.0–10.5)
nRBC: 0 % (ref 0.0–0.2)

## 2022-05-10 LAB — BASIC METABOLIC PANEL
Anion gap: 6 (ref 5–15)
BUN: 16 mg/dL (ref 8–23)
CO2: 28 mmol/L (ref 22–32)
Calcium: 12.4 mg/dL — ABNORMAL HIGH (ref 8.9–10.3)
Chloride: 106 mmol/L (ref 98–111)
Creatinine, Ser: 1.12 mg/dL (ref 0.61–1.24)
GFR, Estimated: >60 mL/min (ref >60)
Glucose, Bld: 117 mg/dL — ABNORMAL HIGH (ref 70–99)
Potassium: 4 mmol/L (ref 3.5–5.1)
Sodium: 140 mmol/L (ref 135–145)

## 2022-05-10 LAB — GLUCOSE, CAPILLARY
Glucose-Capillary: 104 mg/dL — ABNORMAL HIGH (ref 70–99)
Glucose-Capillary: 107 mg/dL — ABNORMAL HIGH (ref 70–99)
Glucose-Capillary: 115 mg/dL — ABNORMAL HIGH (ref 70–99)
Glucose-Capillary: 130 mg/dL — ABNORMAL HIGH (ref 70–99)
Glucose-Capillary: 133 mg/dL — ABNORMAL HIGH (ref 70–99)
Glucose-Capillary: 95 mg/dL (ref 70–99)
Glucose-Capillary: 98 mg/dL (ref 70–99)

## 2022-05-10 MED ORDER — FREE WATER
100.0000 mL | Freq: Four times a day (QID) | Status: DC
  Administered 2022-05-10 – 2022-05-19 (×32): 100 mL

## 2022-05-10 MED ORDER — OSMOLITE 1.5 CAL PO LIQD
1000.0000 mL | ORAL | Status: DC
  Administered 2022-05-10: 1000 mL
  Filled 2022-05-10: qty 1000

## 2022-05-10 MED ORDER — LACTULOSE 10 GM/15ML PO SOLN
20.0000 g | Freq: Every day | ORAL | Status: DC
  Administered 2022-05-10 – 2022-05-11 (×2): 20 g
  Filled 2022-05-10 (×5): qty 30

## 2022-05-10 MED ORDER — OSMOLITE 1.5 CAL PO LIQD
1000.0000 mL | ORAL | Status: DC
  Administered 2022-05-10 – 2022-05-17 (×10): 1000 mL
  Filled 2022-05-10: qty 1000

## 2022-05-10 MED ORDER — PROSOURCE TF20 ENFIT COMPATIBL EN LIQD
60.0000 mL | Freq: Two times a day (BID) | ENTERAL | Status: DC
  Administered 2022-05-10 – 2022-05-30 (×38): 60 mL
  Filled 2022-05-10 (×39): qty 60

## 2022-05-10 MED ORDER — LACTULOSE 10 GM/15ML PO SOLN: 20.0000 g | Freq: Every day | ORAL | Status: DC

## 2022-05-10 NOTE — TOC Progression Note (Signed)
Transition of Care [facility]) - Progression Note    Patient Details  Name: Arjun Hard MRN: [id number] Date of Birth: 1961/02/05  [facility] CM/SW Contact  Carles Collet, RN Phone Number: 05/10/2022, 1:42 PM  Clinical Narrative:     Francesco Runner Feeds  Spoke w patient at bedside, Patients states that he has cans of Jevity 1.5 and a tube feeding pump supplied from the [place] at home. He does not think they have bags for the pump.  He would like to use Jevity 1.5 at home if possible since they already have those in stock. J tube placed, feeds have not been initiated at this time  He also states that he would like hospital bed.   Currently on Supp O2, no home O2. Marland Kitchen   He states that [place] is only coverage, no coverage through work.    Expected Discharge Plan: [place] Barriers to Discharge: Continued Medical Work up  Expected Discharge Plan and Services Expected Discharge Plan: [place]   Discharge Planning Services: CM Consult Post Acute Care Choice: [place] arrangements for the past 2 months: Single Family Home                 DME Arranged:  (see note)         HH Arranged: RN, PT, OT HH Agency: [place] Date [facility] Agency Contacted: 05/08/22 Time Juncal: 32 Representative spoke with at [place]: cory   Social Determinants of Health ([place]) Interventions    Readmission Risk Interventions     No data to display

## 2022-05-10 NOTE — Progress Notes (Signed)
Initial Nutrition Assessment  DOCUMENTATION CODES:   Non-severe (moderate) malnutrition in context of chronic illness  INTERVENTION:   Initiate tube feeds via J-tube: - Start Osmolite 1.5 @ 10 ml/hr and advance by 10 ml q 12 hours to goal rate of 70 ml/hr (1680 ml/day) - PROSource TF20 60 ml BID - Free water flushes of 100 ml q 6 hours  Tube feeding regimen at goal rate provides 2680 kcal, 145 grams of protein, and 1280 ml of H2O.   Total free water with flushes: 1680 ml  - Recommend increasing free water flushes to 150 ml q 4 hours after IVF are d/c  NUTRITION DIAGNOSIS:   Moderate Malnutrition related to chronic illness (esophageal cancer) as evidenced by mild fat depletion, moderate muscle depletion, percent weight loss (8.9% weight loss in less than 5 months).  GOAL:   Patient will meet greater than or equal to 90% of their needs  MONITOR:   Labs, Weight trends, TF tolerance, Skin, I & O's  REASON FOR ASSESSMENT:   Consult Enteral/tube feeding initiation and management  ASSESSMENT:   61 year old male who presented on 10/27 as a direct admission from the [facility] with poor oral intake, dysphagia with fluids, hypercalcemia. PMH of esophageal adenocarcinoma s/p Ivor Lewis esophagectomy and J-tube placement on 04/05/22 (later removed prior to discharge), HTN, pre-DM.  10/28 - full liquid diet, later advanced to regular 10/30 - esophagram showing extravasation at the GE anastomosis and the diaphragm 10/31 - s/p esophageal stent placement, R VATS, J-tube placement, R chest tube placement  Consult received for enteral nutrition initiation and management. Pt with new J-tube placed yesterday in OR. Tube feeds ordered but not started per RN. RD to adjust to better meet pt's needs. Will slowly titrate tube feeds to goal. Discussed with MD and PA; okay to advance feeds by 10 ml q 12 hours. Discussed plan with RN. RN reports pt will have to be NPO for 6 months.  Spoke with pt  at bedside. Pt reports doing well with solid foods at home but struggling with liquid intake due to difficulty swallowing. Pt reports trying to eat or drink something every 2-3 hours. Typical food intake included oatmeal, eggs, chicken, [place] food, soup. Pt has been drinking mostly Gatorade, Boost VHC, milk, juice. Pt reports drinking 1-2 Boost VHC supplements daily at home but only being able to consume half at a time due to fullness.  Explained plan for pt to be NPO with tube feeds as sole source of nutrition at this time. Pt expresses understanding. Tube feeds to start at trickle rate and slowly advance to goal per TCTS request.  Pt reports that he has continued to lose weight after discharge. He reports a UBW of 360 lbs and states that he last weighed this in April 2023. Reviewed weight history in chart. Pt with a 14.2 kg weight loss since 01/03/22. This is an 8.9% weight loss in less than 5 months which is severe and significant for timeframe. Pt meets criteria for moderate malnutrition.  Meal Completion: 50% x 1 meal on 10/28, now NPO  Medications reviewed and include: SSI q 4 hours, IV protonix, IV abx, IV diflucan IVF: NS with KCl @ 75 ml/hr  Labs reviewed: ionized calcium 1.77, corrected calcium 13.92, albumin 2.1, hemoglobin 9.4  UOP: 750 ml x 24 hours R abd JP drain: 120 ml x 24 hours R chest JP drain: 80 ml x 24 hours I/O's: +5.7 L since admit  NUTRITION - FOCUSED PHYSICAL EXAM:  Flowsheet Row Most Recent Value  Orbital Region Mild depletion  Upper Arm Region No depletion  Thoracic and Lumbar Region Mild depletion  Buccal Region Mild depletion  Temple Region Mild depletion  Clavicle Bone Region Moderate depletion  Clavicle and Acromion Bone Region Moderate depletion  Scapular Bone Region Mild depletion  Dorsal Hand Mild depletion  Patellar Region Mild depletion  Anterior Thigh Region Moderate depletion  Posterior Calf Region Mild depletion  Edema (RD Assessment) Mild   Hair Reviewed  Eyes Reviewed  Mouth Reviewed  Skin Reviewed  Nails Reviewed       Diet Order:   Diet Order             Diet NPO time specified  Diet effective now                   EDUCATION NEEDS:   Education needs have been addressed  Skin:  Skin Assessment: Skin Integrity Issues: Stage II: L buttocks Incisions: abdomen, R chest  Last BM:  05/05/22  Height:   Ht Readings from Last 1 Encounters:  04/05/22 6' (1.829 m)    Weight:   Wt Readings from Last 1 Encounters:  05/09/22 (!) 144.6 kg    Ideal Body Weight:  80.9 kg  BMI:  Body mass index is 43.24 kg/m.  Estimated Nutritional Needs:   Kcal:  2500-2700  Protein:  125-145 grams  Fluid:  >2.2 L    Gustavus Bryant, MS, RD, LDN Inpatient Clinical Dietitian Please see [facility] for contact information.

## 2022-05-10 NOTE — Evaluation (Signed)
Physical Therapy Evaluation Patient Details Name: Keith Moreno MRN: [id number] DOB: Dec 20, 1960 Today's Date: 05/10/2022  History of Present Illness  Pt is 61 y/o M who [facility] to [facility] on 05/05[facility] from cancer center with weakness, dizziness, difficulty swalling, admitted for hypercalcemia. 10/31 esophagogastroscopy and VATS. Of note,  pt with recent prolonged hospitalization (9/27-10/20) after esophagectomy  9/27 with placement of jejunostomy tube. PMH to include esophageal CA, HTN, sacral wound.   Clinical Impression  Pt presents with an overall decrease in functional mobility secondary to above. PTA, pt was home for less than a week before readmitted to hospital. Prior to most recent admission, pt was ambulating with RW or furniture guidance, had minimally left home after previous prolonged hospitalization. Today, pt able to ambulate 145ft min guard with RW. Pt overly focused on line setup and required distraction to successful progress through session. Pt would benefit from continued acute PT services to maximize functional mobility and independence prior to d/c to home with HHPT when medically stable.        Recommendations for follow up therapy are one component of a multi-disciplinary discharge planning process, led by the attending physician.  Recommendations may be updated based on patient status, additional functional criteria and insurance authorization.  Follow Up Recommendations Home health PT      Assistance Recommended at Discharge Intermittent Supervision/Assistance  Patient can return home with the following  A little help with walking and/or transfers;A little help with bathing/dressing/bathroom;Assistance with cooking/housework;Assist for transportation;Help with stairs or ramp for entrance    Equipment Recommendations Other (comment) (TBD)  Recommendations for Other Services       Functional Status Assessment Patient has had a recent decline in their functional status  and demonstrates the ability to make significant improvements in function in a reasonable and predictable amount of time.     Precautions / Restrictions Precautions Precautions: Fall Precaution Comments: JP drain x2 Restrictions Weight Bearing Restrictions: No      Mobility  Bed Mobility Overal bed mobility: Needs Assistance Bed Mobility: Supine to Sit   Sidelying to sit: HOB elevated, Supervision       General bed mobility comments: pt able to get EOB supervision with use of handrails, changed bed height, and increased time    Transfers Overall transfer level: Needs assistance Equipment used: Rolling walker (2 wheels) Transfers: Sit to/from Stand Sit to Stand: Min guard, From elevated surface           General transfer comment: min guard for line and JP drain mgmt, pt requested increased bed height and would not trial at traditional bed height    Ambulation/Gait Ambulation/Gait assistance: Min guard Gait Distance (Feet): 120 Feet Assistive device: Rolling walker (2 wheels) Gait Pattern/deviations: Step-through pattern, Decreased stride length Gait velocity: decreased     General Gait Details: pt with HR up to 127 bpm during ambulation, able to turn with RW safely. pt with great preference to use rollator due to built in Programmer, applications    Modified Rankin (Stroke Patients Only)       Balance Overall balance assessment: Needs assistance Sitting-balance support: Feet supported, No upper extremity supported Sitting balance-Leahy Scale: Good Sitting balance - Comments: pt with prolonged sitting EOB while assessing SpO2 without supplemental oxygen   Standing balance support: Bilateral upper extremity supported, During functional activity Standing balance-Leahy Scale: Fair Standing balance comment: pt able to intermittently reach outside BOS to fix lines  and touch IV pole                             Pertinent  Vitals/Pain Pain Assessment Pain Assessment: Faces Faces Pain Scale: Hurts a little bit Pain Location: abdoemn Pain Descriptors / Indicators: Operative site guarding, Aching Pain Intervention(s): Monitored during session    Home Living Family/patient expects to be discharged to:: Private residence Living Arrangements: Spouse/significant other Available Help at Discharge: Family;Available 24 hours/day Type of Home: House Home Access: Stairs to enter Entrance Stairs-Rails: Right Entrance Stairs-Number of Steps: 2   Home Layout: One level Home Equipment: Agricultural consultant (2 wheels);Rollator (4 wheels) Additional Comments: pt received rollator after last admission but has not put it together yet    Prior Function Prior Level of Function : Needs assist;Driving             Mobility Comments: before recent round of hospital admissions, pt independent without assistive device. 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     Hand Dominance   Dominant Hand: Right    Extremity/Trunk Assessment   Upper Extremity Assessment Upper Extremity Assessment: Overall WFL for tasks assessed    Lower Extremity Assessment Lower Extremity Assessment: Overall WFL for tasks assessed (great LE AROM demonstrated during bed mobility)    Cervical / Trunk Assessment Cervical / Trunk Assessment: Normal;Other exceptions Cervical / Trunk Exceptions: R JP drains impacting posture slightly  Communication   Communication: No difficulties  Cognition Arousal/Alertness: Awake/alert Behavior During Therapy: WFL for tasks assessed/performed, Flat affect Overall Cognitive Status: No family/caregiver present to determine baseline cognitive functioning Area of Impairment:  Safety/judgement, Problem solving                         Safety/Judgement: Decreased awareness of deficits   Problem Solving: Requires verbal cues, Requires tactile cues, Slow processing General Comments: Pt fixated on lines and all their functions, very particular about setup, unsure if baseline personality due to no family present        General Comments General comments (skin integrity, edema, etc.): trialed removing supplemental O2 for mobility, pt reported increased SOB and readministed 3L [place]    Exercises     Assessment/Plan    PT Assessment Patient needs continued PT services  PT Problem List Decreased strength;Decreased mobility;Decreased activity tolerance;Decreased balance;Decreased knowledge of use of DME;Decreased safety awareness       PT Treatment Interventions Gait training;Stair training;Functional mobility training;Therapeutic activities;Patient/family education;DME instruction;Therapeutic exercise;Balance training    PT Goals (Current goals can be found in the Care Plan section)  Acute Rehab PT Goals Patient Stated Goal: get back home Time For Goal Achievement: 05/24/22 Potential to Achieve Goals: Good    Frequency Min 3X/week     Co-evaluation               AM-PAC PT "6 Clicks" Mobility  Outcome Measure Help needed turning from your back to your side while in a flat bed without using bedrails?: A Little Help needed moving from lying on your back to sitting on the side of a flat bed without using bedrails?: A Little Help needed moving to and from a  bed to a chair (including a wheelchair)?: A Little Help needed standing up from a chair using your arms (e.g., wheelchair or bedside chair)?: A Little Help needed to walk in hospital room?: A Little Help needed climbing 3-5 steps with a railing? : A Lot 6 Click Score: 17    End of Session Equipment Utilized During Treatment: Gait belt;Oxygen Activity Tolerance: Patient tolerated treatment  well Patient left: in chair;with call bell/phone within reach Nurse Communication: Mobility status PT Visit Diagnosis: Other abnormalities of gait and mobility (R26.89);Difficulty in walking, not elsewhere classified (R26.2);Muscle weakness (generalized) (M62.81)    Time: 1610-9604 PT Time Calculation (min) (ACUTE ONLY): 41 min   Charges:   PT Evaluation $PT Eval Moderate Complexity: 1 Mod PT Treatments $Therapeutic Activity: 8-22 mins        Alric Ran, SPT   [place] Lanyah Spengler 05/10/2022, 1:25 PM

## 2022-05-10 NOTE — Plan of Care (Signed)
  Problem: Education: Goal: Knowledge of General Education information will improve Description: Including pain rating scale, medication(s)/side effects and non-pharmacologic comfort measures Outcome: Progressing   Problem: Health Behavior/Discharge Planning: Goal: Ability to manage health-related needs will improve Outcome: Progressing   Problem: Clinical Measurements: Goal: Ability to maintain clinical measurements within normal limits will improve Outcome: Progressing Goal: Will remain free from infection Outcome: Progressing Goal: Diagnostic test results will improve Outcome: Progressing Goal: Respiratory complications will improve Outcome: Progressing Goal: Cardiovascular complication will be avoided Outcome: Progressing   Problem: Activity: Goal: Risk for activity intolerance will decrease Outcome: Progressing   Problem: Nutrition: Goal: Adequate nutrition will be maintained Outcome: Progressing   Problem: Coping: Goal: Level of anxiety will decrease Outcome: Progressing   Problem: Elimination: Goal: Will not experience complications related to bowel motility Outcome: Progressing Goal: Will not experience complications related to urinary retention Outcome: Progressing   Problem: Pain Managment: Goal: General experience of comfort will improve Outcome: Progressing   Problem: Safety: Goal: Ability to remain free from injury will improve Outcome: Progressing   Problem: Skin Integrity: Goal: Risk for impaired skin integrity will decrease Outcome: Progressing   Problem: Education: Goal: Knowledge of the prescribed therapeutic regimen will improve Outcome: Progressing   Problem: Bowel/Gastric: Goal: Gastrointestinal status for postoperative course will improve Outcome: Progressing   Problem: Nutritional: Goal: Ability to achieve adequate nutritional intake will improve Outcome: Progressing   Problem: Clinical Measurements: Goal: Postoperative  complications will be avoided or minimized Outcome: Progressing   Problem: Respiratory: Goal: Ability to maintain a clear airway will improve Outcome: Progressing   Problem: Skin Integrity: Goal: Demonstration of wound healing without infection will improve Outcome: Progressing

## 2022-05-10 NOTE — Progress Notes (Addendum)
[address]       [place] [address]             [phone]      1 Day Post-Op Procedure(s) (LRB): LAPAROSCOPIC JEJUNOSTOMY (N/A) ESOPHAGOGASTRODUODENOSCOPY (EGD) (N/A) VIDEO ASSISTED THORACOSCOPY (Right) Subjective: Feels ok  Objective: Vital signs in last 24 hours: Temp:  [96.7 F (35.9 C)-98.4 F (36.9 C)] 97.9 F (36.6 C) (11/01 0352) Pulse Rate:  [64-83] 76 (11/01 0352) Cardiac Rhythm: Heart block;Bundle branch block (10/31 1900) Resp:  [20-35] 20 (11/01 0352) BP: (125-174)/(70-146) 155/83 (11/01 0352) SpO2:  [91 %-98 %] 95 % (11/01 0352) Arterial Line BP: (167-207)/(66-86) 207/86 (10/31 1215)  Hemodynamic parameters for last 24 hours:    Intake/Output from previous day: 10/31 0701 - 11/01 0700 In: 2813.7 [I.V.:2230.7; Blood:283; IV Piggyback:300] Out: 1000 [Urine:750; Drains:200; Blood:50] Intake/Output this shift: No intake/output data recorded.  General appearance: alert, cooperative, and no distress Heart: regular rate and rhythm Lungs: mildly dim in bases Abdomen: softt, non tender, no BS Extremities: PAS in place Wound: without signs of infection  Lab Results: Recent Labs    05/09/22 0824 05/10/22 0400  WBC  --  5.8  HGB 7.8* 9.4*  HCT 23.0* 29.9*  PLT  --  250   BMET:  Recent Labs    05/09/22 0311 05/09/22 0824 05/10/22 0400  NA 140 138 140  K 3.4* 3.6 4.0  CL 107  --  106  CO2 27  --  28  GLUCOSE 96  --  117*  BUN 12  --  16  CREATININE 0.98  --  1.12  CALCIUM 11.9*  --  12.4*    PT/INR: No results for input(s): "LABPROT", "INR" in the last 72 hours. ABG    Component Value Date/Time   PHART 7.450 05/09/2022 0824   HCO3 25.8 05/09/2022 0824   TCO2 27 05/09/2022 0824   ACIDBASEDEF 0.3 04/06/2022 0332   O2SAT 100 05/09/2022 0824   CBG (last 3)  Recent Labs    05/09/22 2018 05/10/22 0022 05/10/22 0400  GLUCAP 149* 130* 115*    Meds Scheduled Meds:  Chlorhexidine Gluconate Cloth  6 each Topical  Daily   enoxaparin (LOVENOX) injection  40 mg Subcutaneous Daily   insulin aspart  0-24 Units Subcutaneous Q4H   ketorolac  30 mg Intravenous Q6H   pantoprazole (PROTONIX) IV  40 mg Intravenous Q12H   Continuous Infusions:  0.9 % NaCl with KCl 20 mEq / L 75 mL/hr at 05/10/22 0000   feeding supplement (JEVITY 1.2 CAL)     fluconazole (DIFLUCAN) IV     piperacillin-tazobactam (ZOSYN)  IV 3.375 g (05/10/22 0613)   PRN Meds:.acetaminophen (TYLENOL) oral liquid 160 mg/5 mL, hydrALAZINE, HYDROcodone-acetaminophen, morphine injection, ondansetron (ZOFRAN) IV  Xrays DG Chest Port 1 View  Result Date: 05/09/2022 CLINICAL DATA:  Question pneumothorax. EXAM: PORTABLE CHEST 1 VIEW COMPARISON:  Radiograph from May 08, 2022. FINDINGS: RIGHT-sided Port-A-Cath in situ terminating at the low superior vena cava. Signs of gastrointestinal stenting within the gastric pull-through. Retained barium about the mediastinum. Change of LEFT mediastinal contours since previous imaging with blurring of the aortic knob along the superior aspect of the cardiomediastinal contour. Fullness of RIGHT mediastinal contour since previous imaging likely related to pull-through and adjacent inflammation. Lung volumes are low which also accentuate cardiomediastinal contours. RIGHT-sided chest tube in place. No pneumothorax. Obscured LEFT hemidiaphragm may reflect a mixture of effusion and airspace disease in the LEFT chest. EKG leads  project over the chest. On limited assessment no acute skeletal process. IMPRESSION: 1. RIGHT-sided chest tube in place. No pneumothorax. 2. LEFT mediastinal contours with loss of discrete aortic contour. Based on clinical history potentially related to inflammation in the mediastinum following leak of gastric pull-through and subsequent esophageal stent placement. CT of the chest may be helpful for further evaluation to establish new baseline following recent intervention and known leak. This appearance  could also be related to low lung volumes, lordotic projection and known pre-vascular nodal enlargement. 3. Obscured LEFT hemidiaphragm may reflect a mixture of effusion and airspace disease in the LEFT chest. Electronically Signed   By: Zetta Bills M.D.   On: 05/09/2022 13:59   DG C-Arm 1-60 Min  Result Date: 05/09/2022 CLINICAL DATA:  Fluoroscopic assistance for esophageal stent placement EXAM: DG C-ARM 1-60 MIN FLUOROSCOPY: Fluoroscopy Time:  2 minutes and 14 seconds Radiation Exposure Index (if provided by the fluoroscopic device): 49.93 mGy Number of Acquired Spot Images: 2 COMPARISON:  Barium swallow done on 05/08/2022 FINDINGS: There is interval placement of esophageal stent. There is contrast in the mediastinum, possibly extravasated barium from the previous barium swallow examination. IMPRESSION: Fluoroscopic assistance was provided for placement of esophageal stent. Electronically Signed   By: Elmer Picker M.D.   On: 05/09/2022 09:26   DG ESOPHAGUS W SINGLE CM (SOL OR THIN BA)  Addendum Date: 05/08/2022   ADDENDUM REPORT: 05/08/2022 11:54 ADDENDUM: These results were called by telephone at the time of interpretation on 05/08/2022 at 11:53 am to provider [facility], who verbally acknowledged these results. Electronically Signed   By: Lorin Picket M.D.   On: 05/08/2022 11:54   Result Date: 05/08/2022 CLINICAL DATA:  Difficulty swallowing food. Recent esophagectomy gastric pull-through. EXAM: ESOPHAGUS/BARIUM SWALLOW/TABLET STUDY TECHNIQUE: Single contrast examination was performed using thin liquid barium. This exam was performed by Brynda Greathouse PA and Lorin Picket, MD, and was supervised and interpreted by Lorin Picket, MD. FLUOROSCOPY: Radiation Exposure Index (as provided by the fluoroscopic device): 4.8 mGy Kerma COMPARISON:  CT chest abdomen pelvis 04/18/2022, esophagram 04/10/2022. FINDINGS: Swallowing: Not assessed. Pharynx: Not assessed. Esophagus: Ivor Lewis  esophagectomy with gastric pull-through. There is immediate large volume leak of contrast at the upper gastroesophageal anastomosis. Contrast flows into the stomach, stagnating in the distal stomach, below the diaphragmatic hiatus. Contrast administration was terminated, limiting the evaluation for additional leak below the diaphragmatic hiatus. Esophageal motility: Not assessed. Hiatal Hernia: N/A. Gastroesophageal reflux: The assessed. Ingested 73m barium tablet: Not assessed. Other: None. IMPRESSION: 1. Ivor Lewis esophagectomy with gastric pull-through. Immediate large volume leak of contrast at the upper gastroesophageal anastomosis. 2. Study was terminated due to gastroesophageal leak but additional imaging assessment was performed, showing stasis of contrast in the distal stomach. Evaluation for a leak below the diaphragmatic hiatus is therefore limited. Electronically Signed: By: MLorin PicketM.D. On: 05/08/2022 11:12    Assessment/Plan: S/P Procedure(s) (LRB): LAPAROSCOPIC JEJUNOSTOMY (N/A) ESOPHAGOGASTRODUODENOSCOPY (EGD) (N/A) VIDEO ASSISTED THORACOSCOPY (Right) POD #1  1 afebrile, SBP 120s to 170s- on prn hydralizine, sinus rhythm with 1 deg AVB, PVC's 2 oxygen saturations okay on 3 L 3 fair urine output 4 drains-200 cc 5 blood sugars adequately controlled 6 normal renal function 7 H/H 9.4/29.9,  no leukocytosis 8 chest x-ray-pending 9 J tube feedings to start slowly today 10 mobilize, pulm hygiene- routine, will get PT consult to assist   LOS: 5 days    WJohn Giovanni11/07/2021    Agree with above Titrating  tube feeds Continue abx and antifungals PT/OT  Alea Ryer Bary Leriche

## 2022-05-10 NOTE — TOC Progression Note (Signed)
Transition of Care [facility]) - Progression Note    Patient Details  Name: Keith Moreno MRN: [id number] Date of Birth: [birth year]  [facility]) CM/SW Contact  Zenon Mayo, RN Phone Number: 05/10/2022, 1:47 PM  Clinical Narrative:    NCM left vm for [name] [phone] ext [phone] at the [facility] for return call.  Per MD patient will need to go home with hospital bed and tube feeding when ever he gets stable.  NCM scanned Urgent items for discharge  ( hospital bed and bariatric shower chair to the [place] today).  NCM spoke with [name] the Nutritionist, informed her patient has Jevity 1.5 at home from last time he was here, she states this may not work for him this time because he has a j tube not peg tube, she will let us know what he will need closer to discharge.   11/2[name]  with [name] called  this NCM and states she received the order for the hospital bed and she also has the order for the bariatric shower chair.     Expected Discharge Plan: [place] Barriers to Discharge: Continued Medical Work up  Expected Discharge Plan and Services Expected Discharge Plan: [place]   Discharge Planning Services: CM Consult Post Acute Care Choice: [place] arrangements for the past 2 months: Single Family Home                 DME Arranged:  (see note)         HH Arranged: RN, PT, OT HH Agency: [place] Date [facility] Agency Contacted: 05/08/22 Time [place]: 6 Representative spoke with at [place]: cory   Social Determinants of Health ([place]) Interventions    Readmission Risk Interventions     No data to display

## 2022-05-11 ENCOUNTER — Encounter (HOSPITAL_COMMUNITY): Payer: Self-pay | Admitting: Thoracic Surgery (Cardiothoracic Vascular Surgery)

## 2022-05-11 ENCOUNTER — Encounter: Payer: No Typology Code available for payment source | Admitting: Dietician

## 2022-05-11 ENCOUNTER — Other Ambulatory Visit: Payer: Self-pay

## 2022-05-11 LAB — GLUCOSE, CAPILLARY
Glucose-Capillary: 105 mg/dL — ABNORMAL HIGH (ref 70–99)
Glucose-Capillary: 101 mg/dL — ABNORMAL HIGH (ref 70–99)
Glucose-Capillary: 97 mg/dL (ref 70–99)
Glucose-Capillary: 110 mg/dL — ABNORMAL HIGH (ref 70–99)
Glucose-Capillary: 106 mg/dL — ABNORMAL HIGH (ref 70–99)
Glucose-Capillary: 136 mg/dL — ABNORMAL HIGH (ref 70–99)

## 2022-05-11 MED ORDER — KETOROLAC TROMETHAMINE 30 MG/ML IJ SOLN
30.0000 mg | Freq: Four times a day (QID) | INTRAMUSCULAR | Status: AC
  Administered 2022-05-11 – 2022-05-12 (×4): 30 mg via INTRAVENOUS
  Filled 2022-05-11 (×3): qty 1

## 2022-05-11 NOTE — TOC Progression Note (Addendum)
Transition of Care [facility]) - Progression Note    Patient Details  Name: Keith Moreno MRN: [id number] Date of Birth: Dec 11, 1960  Transition of Care [facility]) CM/SW Contact  Ernesteen Mihalic, Edson Snowball, RN Phone Number: 05/11/2022, 12:09 PM  Clinical Narrative:     Previous CM sent orders for hospital bed and 3 in 1 bariatric and confirmed [facility] received   Patient has Jevity at home and may need Osmolite at discharge.   NCM will need orders once home tube feeding is determined and then NCM will call and send to [facility] to patient at bedside . Patient aware of all of above  Expected Discharge Plan: [place] Barriers to Discharge: Continued Medical Work up  Expected Discharge Plan and Services Expected Discharge Plan: [place]   Discharge Planning Services: CM Consult Post Acute Care Choice: [place] arrangements for the past 2 months: Single Family Home                 DME Arranged:  (see note)         HH Arranged: RN, PT, OT HH Agency: [place] Date [facility] Agency Contacted: 05/08/22 Time [place]: 83 Representative spoke with at [place]: cory   Social Determinants of Health (SDOH) Interventions    Readmission Risk Interventions     No data to display

## 2022-05-11 NOTE — Evaluation (Signed)
Occupational Therapy Evaluation Patient Details Name: Keith Moreno MRN: [id number] DOB: 06/03/1961 Today's Date: 05/11/2022   History of Present Illness Pt is 61 y/o M who [facility] from cancer center with weakness, dizziness, difficulty swalling, admitted for hypercalcemia. 10/31 esophagogastroscopy and VATS. Of note,  pt with recent prolonged hospitalization (9/27-10/20) after esophagectomy  9/27 with placement of jejunostomy tube. PMH to include esophageal CA, HTN, sacral wound.   Clinical Impression   Patient admitted for the diagnosis above.  PTA he lives at home with his spouse, who assist with sponge bathing, lower body ADL, and iADLs.  Patient generally did not use an AD for mobility, but does have DME from the [place] to use.  OT will continue efforts in the acute setting with [facility] OT recommended for post acute rehab.       Recommendations for follow up therapy are one component of a multi-disciplinary discharge planning process, led by the attending physician.  Recommendations may be updated based on patient status, additional functional criteria and insurance authorization.   Follow Up Recommendations  Home health OT    Assistance Recommended at Discharge Intermittent Supervision/Assistance  Patient can return home with the following A little help with walking and/or transfers;A little help with bathing/dressing/bathroom;Assistance with cooking/housework;Assist for transportation;Help with stairs or ramp for entrance    Functional Status Assessment  Patient has had a recent decline in their functional status and demonstrates the ability to make significant improvements in function in a reasonable and predictable amount of time.  Equipment Recommendations  None recommended by OT    Recommendations for Other Services       Precautions / Restrictions Precautions Precautions: Fall Precaution Comments: JP drain x2, tube feed Restrictions Weight Bearing  Restrictions: No      Mobility Bed Mobility Overal bed mobility: Needs Assistance Bed Mobility: Sit to Supine   Sidelying to sit: HOB elevated, Supervision         Patient Response: Cooperative  Transfers Overall transfer level: Needs assistance Equipment used: Rolling walker (2 wheels) Transfers: Sit to/from Stand, Bed to chair/wheelchair/BSC Sit to Stand: Min guard, From elevated surface     Step pivot transfers: Supervision            Balance Overall balance assessment: Needs assistance Sitting-balance support: Feet supported, No upper extremity supported Sitting balance-Leahy Scale: Good     Standing balance support: Bilateral upper extremity supported, During functional activity Standing balance-Leahy Scale: Fair                             ADL either performed or assessed with clinical judgement   ADL   Eating/Feeding: NPO   Grooming: Set up;Sitting   Upper Body Bathing: Minimal assistance;Sitting   Lower Body Bathing: Moderate assistance;Sit to/from stand   Upper Body Dressing : Moderate assistance;Sitting   Lower Body Dressing: Moderate assistance;Sit to/from stand   Toilet Transfer: Supervision/safety;BSC/3in1;Stand-pivot                   Vision Baseline Vision/History: 0 No visual deficits       Perception Perception Perception: Within Functional Limits   Praxis Praxis Praxis: Intact    Pertinent Vitals/Pain Pain Assessment Pain Assessment: PAINAD Pain Score: 4  Pain Location: generalized Pain Descriptors / Indicators: Aching Pain Intervention(s): Monitored during session     Hand Dominance Right   Extremity/Trunk Assessment Upper Extremity Assessment Upper Extremity Assessment: Generalized weakness   Lower  Extremity Assessment Lower Extremity Assessment: Defer to PT evaluation       Communication Communication Communication: No difficulties   Cognition Arousal/Alertness: Awake/alert Behavior  During Therapy: WFL for tasks assessed/performed, Flat affect Overall Cognitive Status: Within Functional Limits for tasks assessed                                       General Comments   O2 94% on RA    Exercises     Shoulder Instructions      Home Living Family/patient expects to be discharged to:: Private residence Living Arrangements: Spouse/significant other Available Help at Discharge: Family;Available 24 hours/day Type of Home: House Home Access: Stairs to enter [employer] of Steps: 2 Entrance Stairs-Rails: Right Home Layout: One level     Bathroom Shower/Tub: Tub/shower unit;Walk-in shower   Bathroom Toilet: Standard Bathroom Accessibility: Yes How Accessible: Accessible via walker Home Equipment: [place] (2 wheels);Rollator (4 wheels)          Prior Functioning/Environment               Mobility Comments: before recent round of hospital admissions, pt independent without assistive device. 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        OT Problem List: Decreased strength;Decreased range of motion;Decreased activity tolerance;Impaired balance (sitting and/or standing);Decreased safety awareness;Decreased knowledge of use of DME or AE;Decreased knowledge of precautions;Pain      OT Treatment/Interventions: Self-care/ADL training;Therapeutic exercise;DME and/or AE instruction;Therapeutic activities;Patient/family education;Balance training;Energy conservation    OT Goals(Current goals can be found in the care plan section) Acute Rehab OT Goals Patient Stated Goal: Return home OT Goal Formulation: With patient Time For Goal Achievement: 05/25/22 Potential to Achieve Goals:  Good ADL Goals Pt Will Perform Grooming: with modified independence;standing Pt Will Perform Upper Body Bathing: with set-up;sitting Pt Will Perform Upper Body Dressing: with set-up;sitting;standing Pt Will Transfer to Toilet: with modified independence;regular height toilet;ambulating Pt/caregiver will Perform Home Exercise Program: Increased strength;Both right and left upper extremity;With theraband;With written HEP provided  OT Frequency: Min 2X/week    Co-evaluation              AM-PAC OT "6 Clicks" Daily Activity     Outcome Measure Help from another person eating meals?: Total Help from another person taking care of personal grooming?: A Little Help from another person toileting, which includes using toliet, bedpan, or urinal?: A Little Help from another person bathing (including washing, rinsing, drying)?: A Lot Help from another person to put on and taking off regular upper body clothing?: A Lot Help from another person to put on and taking off regular lower body clothing?: A Lot 6 Click Score: 13   End of Session Equipment Utilized During Treatment: Rolling walker (2 wheels) Nurse Communication: Mobility status  Activity Tolerance: Patient tolerated treatment well Patient left: in chair;with call bell/phone within reach;with family/visitor present  OT Visit Diagnosis: Unsteadiness on feet (R26.81);Muscle weakness (generalized) (M62.81)                Time: 7948-0165 OT Time Calculation (min): 22 min Charges:  OT General Charges $OT Visit: 1 Visit OT  Evaluation $OT Eval Moderate Complexity: 1 Mod  05/11/2022  RP, OTR/L  Acute Rehabilitation Services  Office:  [phone]   Keith Moreno 05/11/2022, 10:18 AM

## 2022-05-11 NOTE — Progress Notes (Signed)
     [address]       [place] [address]             [phone]       No events overnight Tolerating tube feeds.  Currently '@30'$   Vitals:   05/11/22 0300 05/11/22 0700  BP: (!) 162/83 (!) 159/80  Pulse: 81 71  Resp: (!) 21 (!) 21  Temp: 97.9 F (36.6 C) 97.9 F (36.6 C)  SpO2: 97% 99%   Alert NAD Sinus EWOB Abd soft Tubes in place  S/p rIVor Lewis esophagectomy on 9/27 with late leak at the anastomosis and pyloroplasty.  Now s/p Esophageal stent placement.  Will check serial pre-albumins to assess nutritional status Continue zosyn and diflucan PT/OT  Keith Moreno

## 2022-05-11 NOTE — Progress Notes (Signed)
Mobility Specialist Progress Note    05/11/22 1533  Mobility  Activity Ambulated with assistance to bathroom  Level of Assistance Minimal assist, patient does 75% or more  Assistive Device Front wheel walker  Distance Ambulated (ft) 20 ft (10+10)  Activity Response Tolerated fair  Mobility Referral Yes  $Mobility charge 1 Mobility   Post-Mobility: 93 HR, 156/86 (106) BP, 95% SpO2  Pt received requesting to attempt BM and successful. Agreeable to further ambulation but deferred d/t pt c/o being lightheaded and visibly shaking. Returned to chair with call bell in reach. RN notified.   Hildred Alamin Mobility Specialist  Secure Chat Only

## 2022-05-11 NOTE — Progress Notes (Signed)
Mobility Specialist Progress Note    05/11/22 1133  Mobility  Activity Ambulated with assistance in hallway  Level of Assistance Contact guard assist, steadying assist  Assistive Device Four wheel walker  Distance Ambulated (ft) 150 ft (75+75)  Activity Response Tolerated well  Mobility Referral Yes  $Mobility charge 1 Mobility   Pre-Mobility: 84 HR, 167/82 (106) BP, 92% SpO2 During Mobility: 118 HR, >/=90% SpO2 Post-Mobility: 95 HR, 177/101 (119) BP, 94% SpO2  Pt received in chair and agreeable. No complaints. Encouraged pursed lip breathing. SpO2 >/=90% on RA with reliable pleth. Took x1 seated rest break. Returned to chair with call bell in reach.   Hildred Alamin Mobility Specialist  Secure Chat Only

## 2022-05-12 LAB — TYPE AND SCREEN
ABO/RH(D): O POS
Antibody Screen: NEGATIVE
Unit division: 0
Unit division: 0

## 2022-05-12 LAB — BPAM RBC
Blood Product Expiration Date: 202311282359
Blood Product Expiration Date: 202311282359
ISSUE DATE / TIME: 202310310846
ISSUE DATE / TIME: 202310310846
Unit Type and Rh: 5100
Unit Type and Rh: 5100

## 2022-05-12 LAB — GLUCOSE, CAPILLARY
Glucose-Capillary: 131 mg/dL — ABNORMAL HIGH (ref 70–99)
Glucose-Capillary: 108 mg/dL — ABNORMAL HIGH (ref 70–99)
Glucose-Capillary: 105 mg/dL — ABNORMAL HIGH (ref 70–99)
Glucose-Capillary: 116 mg/dL — ABNORMAL HIGH (ref 70–99)
Glucose-Capillary: 116 mg/dL — ABNORMAL HIGH (ref 70–99)
Glucose-Capillary: 127 mg/dL — ABNORMAL HIGH (ref 70–99)

## 2022-05-12 MED ORDER — OLOPATADINE HCL 0.1 % OP SOLN
1.0000 [drp] | Freq: Two times a day (BID) | OPHTHALMIC | Status: DC
  Administered 2022-05-12 – 2022-06-05 (×49): 1 [drp] via OPHTHALMIC
  Filled 2022-05-12 (×2): qty 5

## 2022-05-12 MED ORDER — ORAL CARE MOUTH RINSE: 15.0000 mL | OROMUCOSAL | Status: DC | PRN

## 2022-05-12 MED ORDER — ORAL CARE MOUTH RINSE
15.0000 mL | OROMUCOSAL | Status: DC
  Administered 2022-05-12 – 2022-06-05 (×60): 15 mL via OROMUCOSAL

## 2022-05-12 NOTE — Progress Notes (Signed)
Physical Therapy Treatment Patient Details Name: Keith Moreno MRN: [id number] DOB: 28-Sep-1960 Today's Date: 05/12/2022   History of Present Illness Pt is 61 y/o M who presents to [facility] on 05/05/22 from cancer center with weakness, dizziness, difficulty swalling, admitted for hypercalcemia. 10/31 esophagogastroscopy and VATS. Of note,  pt with recent prolonged hospitalization (9/27-10/20) after esophagectomy  9/27 with placement of jejunostomy tube. PMH to include esophageal CA, HTN, sacral wound.    PT Comments    Pt progressing with mobility. Today's session focused on ambulation while monitoring dyspnea and vital signs. Pt ambulated 190ftx2 min guard with rollator, one seated rest break due to mild dyspnea and fatigue. Pt concerned about UE shakes since operation, notified MD during encounter while ambulating. O2 titrated from 3L to 2L [place], SpO2 >92% during session when pleth reliable.  Pt remains limited by generalized weakness, decreased activity tolerance, and impaired balance strategies/postural reactions. Continue to recommend acute PT services to maximize functional mobility and independence prior to d/c to HHPT.    Recommendations for follow up therapy are one component of a multi-disciplinary discharge planning process, led by the attending physician.  Recommendations may be updated based on patient status, additional functional criteria and insurance authorization.  Follow Up Recommendations  Home health PT     Assistance Recommended at Discharge Intermittent Supervision/Assistance  Patient can return home with the following A little help with walking and/or transfers;A little help with bathing/dressing/bathroom;Assistance with cooking/housework;Assist for transportation;Help with stairs or ramp for entrance   Equipment Recommendations  Other (comment) (TBD - please verify pt has bariatric rollator at home)    Recommendations for Other Services       Precautions / Restrictions  Precautions Precautions: Fall;Other (comment) Precaution Comments: JP drain x2, tube feed Restrictions Weight Bearing Restrictions: No     Mobility  Bed Mobility Overal bed mobility: Needs Assistance Bed Mobility: Supine to Sit     Supine to sit: HOB elevated, Modified independent (Device/Increase time)     General bed mobility comments: pt able to get EOB mod independent with use of handrails and elevated changed bed height, did require one verbal cue for drain position    Transfers Overall transfer level: Needs assistance Equipment used: Rollator (4 wheels) Transfers: Sit to/from Stand Sit to Stand: From elevated surface, Min guard           General transfer comment: min guard for line management to monitor due to one bout of dizziness yesterday after mobility    Ambulation/Gait Ambulation/Gait assistance: Min guard Gait Distance (Feet): 110 Feet Assistive device: Rollator (4 wheels) Gait Pattern/deviations: Step-through pattern, Decreased stride length Gait velocity: decreased     General Gait Details: min guard for line management, 1103ft x 2 with one seated rest break, pt appreciative of using rollator due to secutiy of built in seat; did experience some dyspnea when speaking while ambulating   Stairs             Wheelchair Mobility    Modified Rankin (Stroke Patients Only)       Balance Overall balance assessment: Needs assistance Sitting-balance support: Feet supported, No upper extremity supported Sitting balance-Leahy Scale: Good Sitting balance - Comments: sitting EOB without external support and no LOB   Standing balance support: Single extremity supported, Bilateral upper extremity supported, During functional activity Standing balance-Leahy Scale: Fair Standing balance comment: pt always with one UE on rollator, was able to reach fwd with other to demonstrate fair dynamic standing balance  Cognition  Arousal/Alertness: Awake/alert Behavior During Therapy: WFL for tasks assessed/performed, Flat affect Overall Cognitive Status: Impaired/Different from baseline Area of Impairment: Safety/judgement, Problem solving                         Safety/Judgement: Decreased awareness of deficits   Problem Solving: Requires verbal cues, Requires tactile cues, Slow processing General Comments: pt remains to be particular about line setup prior to movement, over fixated on new development of shakiness. pt states unsure if because of anxiety of operation, did tell MD today as we encountered him during ambulation        Exercises      General Comments General comments (skin integrity, edema, etc.): Pre mobility BP 153/84, SpO2 >92% on 2L [place] (titrated down from 3L)      Pertinent Vitals/Pain Pain Assessment Faces Pain Scale: Hurts a little bit Pain Location: abdomen Pain Descriptors / Indicators: Discomfort Pain Intervention(s): Monitored during session    Home Living                          Prior Function            PT Goals (current goals can now be found in the care plan section) Acute Rehab PT Goals Patient Stated Goal: get back home PT Goal Formulation: With patient Time For Goal Achievement: 05/24/22 Potential to Achieve Goals: Good Progress towards PT goals: Progressing toward goals    Frequency    Min 3X/week      PT Plan Current plan remains appropriate    Co-evaluation              AM-PAC PT "6 Clicks" Mobility   Outcome Measure  Help needed turning from your back to your side while in a flat bed without using bedrails?: A Little Help needed moving from lying on your back to sitting on the side of a flat bed without using bedrails?: A Little Help needed moving to and from a bed to a chair (including a wheelchair)?: A Little Help needed standing up from a chair using your arms (e.g., wheelchair or bedside chair)?: A Little Help needed  to walk in hospital room?: A Little Help needed climbing 3-5 steps with a railing? : A Little 6 Click Score: 18    End of Session Equipment Utilized During Treatment: Gait belt;Oxygen Activity Tolerance: Patient tolerated treatment well Patient left: in chair;with call bell/phone within reach Nurse Communication: Mobility status;Other (comment) (oxygen status) PT Visit Diagnosis: Other abnormalities of gait and mobility (R26.89);Difficulty in walking, not elsewhere classified (R26.2);Muscle weakness (generalized) (M62.81)     Time: 4098-1191 PT Time Calculation (min) (ACUTE ONLY): 40 min  Charges:  $Gait Training: 8-22 mins $Therapeutic Exercise: 8-22 mins $Therapeutic Activity: 8-22 mins                    Alric Ran, SPT    [place] Danaya Geddis 05/12/2022, 10:52 AM

## 2022-05-12 NOTE — Progress Notes (Signed)
Mobility Specialist Progress Note    05/12/22 1610  Mobility  Activity Ambulated with assistance in hallway  Level of Assistance Standby assist, set-up cues, supervision of patient - no hands on  Assistive Device Four wheel walker  Distance Ambulated (ft) 360 ft (180+180)  Activity Response Tolerated well  Mobility Referral Yes  $Mobility charge 1 Mobility   Pre-Mobility: 81 HR, BP, 98% SpO2 During Mobility: 120 HR, >/=88% SpO2 on 3LO2 Post-Mobility: 93 HR, 97% SpO2  Pt received in chair and agreeable. No complaints on walk. On 3LO2. Took x1 seated rest break ~2 minutes. Returned to chair with call bell in reach.    Hildred Alamin Mobility Specialist  Secure Chat Only

## 2022-05-12 NOTE — TOC Progression Note (Signed)
Transition of Care [facility]) - Progression Note    Patient Details  Name: Keith Moreno MRN: [id number] Date of Birth: 1961-04-07  Transition of [facility]) CM/SW Contact  Jacalyn Lefevre Edson Snowball, RN Phone Number: 05/12/2022, 11:42 AM  Clinical Narrative:     Damaris Schooner to dietitian regarding home tube feeding. Home tube feeding has not been determined. Once determined NCM will fax order to [facility]    Expected Discharge Plan: [place] Barriers to Discharge: Continued Medical Work up  Expected Discharge Plan and Services Expected Discharge Plan: [place]   Discharge Planning Services: CM Consult Post Acute Care Choice: [place] arrangements for the past 2 months: Single Family Home                 DME Arranged:  (see note)         HH Arranged: RN, PT, OT HH Agency: [place] Date [facility] Agency Contacted: 05/08/22 Time [place]: 33 Representative spoke with at [place]: cory   Social Determinants of Health (SDOH) Interventions    Readmission Risk Interventions     No data to display

## 2022-05-12 NOTE — Progress Notes (Addendum)
[address]       [place] [address]             [phone]     3 Days Post-Op Procedure(s) (LRB): LAPAROSCOPIC JEJUNOSTOMY (N/A) ESOPHAGOGASTRODUODENOSCOPY (EGD) (N/A) VIDEO ASSISTED THORACOSCOPY (Right) Subjective: Feels okay, having some minor difficulties at times with pulmonary secretions making him feel like he is choking a little  Objective: Vital signs in last 24 hours: Temp:  [97.7 F (36.5 C)-98.6 F (37 C)] 98.1 F (36.7 C) (11/03 0726) Pulse Rate:  [73-106] 73 (11/03 0726) Cardiac Rhythm: Heart block (11/03 0700) Resp:  [16-25] 20 (11/03 0726) BP: (118-173)/(76-95) 135/76 (11/03 0726) SpO2:  [92 %-96 %] 96 % (11/03 0726)  Hemodynamic parameters for last 24 hours:    Intake/Output from previous day: 11/02 0701 - 11/03 0700 In: 3815.2 [I.V.:2091.4; NG/GT:1409.3; IV Piggyback:254.5] Out: 695 [Urine:650; Drains:45] Intake/Output this shift: No intake/output data recorded.  General appearance: alert, cooperative, and no distress Heart: regular rate and rhythm Lungs: Diminished left greater than right base Abdomen: Soft nontender Extremities: No significant edema Wound: Incisions healing well  Lab Results: Recent Labs    05/09/22 0824 05/10/22 0400  WBC  --  5.8  HGB 7.8* 9.4*  HCT 23.0* 29.9*  PLT  --  250   BMET:  Recent Labs    05/09/22 0824 05/10/22 0400  NA 138 140  K 3.6 4.0  CL  --  106  CO2  --  28  GLUCOSE  --  117*  BUN  --  16  CREATININE  --  1.12  CALCIUM  --  12.4*    PT/INR: No results for input(s): "LABPROT", "INR" in the last 72 hours. ABG    Component Value Date/Time   PHART 7.450 05/09/2022 0824   HCO3 25.8 05/09/2022 0824   TCO2 27 05/09/2022 0824   ACIDBASEDEF 0.3 04/06/2022 0332   O2SAT 100 05/09/2022 0824   CBG (last 3)  Recent Labs    05/11/22 2319 05/12/22 0320 05/12/22 0728  GLUCAP 136* 131* 108*    Meds Scheduled Meds:  Chlorhexidine Gluconate Cloth  6 each Topical Daily    enoxaparin (LOVENOX) injection  40 mg Subcutaneous Daily   feeding supplement (PROSource TF20)  60 mL Per Tube BID   free water  100 mL Per Tube Q6H   insulin aspart  0-24 Units Subcutaneous Q4H   ketorolac  30 mg Intravenous Q6H   lactulose  20 g Per Tube Daily   mouth rinse  15 mL Mouth Rinse 4 times per day   pantoprazole (PROTONIX) IV  40 mg Intravenous Q12H   Continuous Infusions:  0.9 % NaCl with KCl 20 mEq / L 75 mL/hr at 05/12/22 0700   feeding supplement (OSMOLITE 1.5 CAL) 30 mL/hr at 05/12/22 0700   fluconazole (DIFLUCAN) IV 200 mg (05/11/22 0955)   piperacillin-tazobactam (ZOSYN)  IV 12.5 mL/hr at 05/12/22 0700   PRN Meds:.acetaminophen (TYLENOL) oral liquid 160 mg/5 mL, hydrALAZINE, HYDROcodone-acetaminophen, morphine injection, ondansetron (ZOFRAN) IV, mouth rinse  Xrays DG Chest Port 1 View  Result Date: 05/10/2022 CLINICAL DATA:  Follow-up examination [id number] H/O esophagectomy EXAM: PORTABLE CHEST 1 VIEW COMPARISON:  Chest x-ray May 09, 2022. FINDINGS: Right-sided chest tube remains in place. Postsurgical changes of gastrointestinal stenting. Redemonstrated barium in the mediastinum. No definite visible pneumothorax. Similar left basilar opacity. Probable layering left pleural effusion. Similar mediastinal contour, discussed on the prior. IMPRESSION: 1. Right chest tube in place without  definite visible pneumothorax. 2. Similar mediastinal contour, discussed on the prior. 3. Similar left basilar opacity, possibly a combination of layering pleural effusion overlying atelectasis and/or consolidation. Electronically Signed   By: Margaretha Sheffield M.D.   On: 05/10/2022 09:17    Assessment/Plan: S/P Procedure(s) (LRB): LAPAROSCOPIC JEJUNOSTOMY (N/A) ESOPHAGOGASTRODUODENOSCOPY (EGD) (N/A) VIDEO ASSISTED THORACOSCOPY (Right)   1 afebrile, vital signs variable with systolic blood pressure 280K to 170s 2 oxygen saturations okay on 3 L as she may require home oxygen at  discharge 3 voiding, all urine output is not measured 4 drains-45 cc/24 h 5 no new labs or x-rays-we will order labs for a.m. 6 advancing tube feeds 7 remains on Diflucan and Zosyn 8 continue therapies rehabilitation 9 lovenox for DVT ppx   LOS: 7 days    Gaspar Bidding Pager [phone] 05/12/2022  Agree with above On tube feeds Continue abx and antifungal Will check pre-albumin on Monday  [place]

## 2022-05-13 LAB — GLUCOSE, CAPILLARY
Glucose-Capillary: 100 mg/dL — ABNORMAL HIGH (ref 70–99)
Glucose-Capillary: 106 mg/dL — ABNORMAL HIGH (ref 70–99)
Glucose-Capillary: 116 mg/dL — ABNORMAL HIGH (ref 70–99)
Glucose-Capillary: 134 mg/dL — ABNORMAL HIGH (ref 70–99)
Glucose-Capillary: 127 mg/dL — ABNORMAL HIGH (ref 70–99)
Glucose-Capillary: 107 mg/dL — ABNORMAL HIGH (ref 70–99)

## 2022-05-13 LAB — BASIC METABOLIC PANEL
Anion gap: 6 (ref 5–15)
BUN: 21 mg/dL (ref 8–23)
CO2: 27 mmol/L (ref 22–32)
Calcium: 12.4 mg/dL — ABNORMAL HIGH (ref 8.9–10.3)
Chloride: 113 mmol/L — ABNORMAL HIGH (ref 98–111)
Creatinine, Ser: 0.99 mg/dL (ref 0.61–1.24)
GFR, Estimated: >60 mL/min (ref >60)
Glucose, Bld: 119 mg/dL — ABNORMAL HIGH (ref 70–99)
Potassium: 3.9 mmol/L (ref 3.5–5.1)
Sodium: 146 mmol/L — ABNORMAL HIGH (ref 135–145)

## 2022-05-13 LAB — CBC
HCT: 29.5 % — ABNORMAL LOW (ref 39.0–52.0)
Hemoglobin: 8.7 g/dL — ABNORMAL LOW (ref 13.0–17.0)
MCH: 27.8 pg (ref 26.0–34.0)
MCHC: 29.5 g/dL — ABNORMAL LOW (ref 30.0–36.0)
MCV: 94.2 fL (ref 80.0–100.0)
Platelets: 215 10*3/uL (ref 150–400)
RBC: 3.13 MIL/uL — ABNORMAL LOW (ref 4.22–5.81)
RDW: 14.5 % (ref 11.5–15.5)
WBC: 3.8 10*3/uL — ABNORMAL LOW (ref 4.0–10.5)
nRBC: 0 % (ref 0.0–0.2)

## 2022-05-13 MED ORDER — GERHARDT'S BUTT CREAM
TOPICAL_CREAM | CUTANEOUS | Status: DC | PRN
  Filled 2022-05-13 (×2): qty 1

## 2022-05-13 NOTE — Plan of Care (Signed)
  Problem: Education: Goal: Knowledge of General Education information will improve Description: Including pain rating scale, medication(s)/side effects and non-pharmacologic comfort measures Outcome: Progressing   Problem: Health Behavior/Discharge Planning: Goal: Ability to manage health-related needs will improve Outcome: Progressing   Problem: Clinical Measurements: Goal: Ability to maintain clinical measurements within normal limits will improve Outcome: Progressing Goal: Will remain free from infection Outcome: Progressing Goal: Diagnostic test results will improve Outcome: Progressing Goal: Respiratory complications will improve Outcome: Progressing Goal: Cardiovascular complication will be avoided Outcome: Progressing   Problem: Activity: Goal: Risk for activity intolerance will decrease Outcome: Progressing   Problem: Nutrition: Goal: Adequate nutrition will be maintained Outcome: Progressing   Problem: Coping: Goal: Level of anxiety will decrease Outcome: Progressing   Problem: Elimination: Goal: Will not experience complications related to bowel motility Outcome: Progressing Goal: Will not experience complications related to urinary retention Outcome: Progressing   Problem: Pain Managment: Goal: General experience of comfort will improve Outcome: Progressing   Problem: Safety: Goal: Ability to remain free from injury will improve Outcome: Progressing   Problem: Skin Integrity: Goal: Risk for impaired skin integrity will decrease Outcome: Progressing   Problem: Education: Goal: Knowledge of the prescribed therapeutic regimen will improve Outcome: Progressing   Problem: Bowel/Gastric: Goal: Gastrointestinal status for postoperative course will improve Outcome: Progressing   Problem: Nutritional: Goal: Ability to achieve adequate nutritional intake will improve Outcome: Progressing   Problem: Clinical Measurements: Goal: Postoperative  complications will be avoided or minimized Outcome: Progressing   Problem: Respiratory: Goal: Ability to maintain a clear airway will improve Outcome: Progressing   Problem: Skin Integrity: Goal: Demonstration of wound healing without infection will improve Outcome: Progressing

## 2022-05-13 NOTE — Progress Notes (Addendum)
[address]       [place] [address]             [phone]     4 Days Post-Op Procedure(s) (LRB): LAPAROSCOPIC JEJUNOSTOMY (N/A) ESOPHAGOGASTRODUODENOSCOPY (EGD) (N/A) VIDEO ASSISTED THORACOSCOPY (Right) Subjective: Sitting up in bed, says he feels OK, Having bowel movements and asked to stop the laxatives. Denies pain.   TF at 29m/hr  Objective: Vital signs in last 24 hours: Temp:  [97.6 F (36.4 C)-98.6 F (37 C)] 97.6 F (36.4 C) (11/04 0721) Pulse Rate:  [61-80] 75 (11/04 0721) Cardiac Rhythm: Heart block (11/04 0700) Resp:  [19-23] 20 (11/04 0721) BP: (119-170)/(80-93) 165/80 (11/04 0721) SpO2:  [97 %-99 %] 99 % (11/04 0721)  Hemodynamic parameters for last 24 hours:    Intake/Output from previous day: 11/03 0701 - 11/04 0700 In: 1950.1 [I.V.:734.3; NG/GT:1045.8; IV Piggyback:50] Out: 370 [Urine:300; Drains:70] Intake/Output this shift: Total I/O In: -  Out: 650 [Urine:650]  General appearance: alert, cooperative, and no distress Heart: regular rate and rhythm Lungs: Diminished left greater than right base Abdomen: Soft nontender Extremities: No significant edema Wound: Incisions intact and healing. JP drains secure.   Lab Results: Recent Labs    05/13/22 0345  WBC 3.8*  HGB 8.7*  HCT 29.5*  PLT 215    BMET:  Recent Labs    05/13/22 0345  NA 146*  K 3.9  CL 113*  CO2 27  GLUCOSE 119*  BUN 21  CREATININE 0.99  CALCIUM 12.4*     PT/INR: No results for input(s): "LABPROT", "INR" in the last 72 hours. ABG    Component Value Date/Time   PHART 7.450 05/09/2022 0824   HCO3 25.8 05/09/2022 0824   TCO2 27 05/09/2022 0824   ACIDBASEDEF 0.3 04/06/2022 0332   O2SAT 100 05/09/2022 0824   CBG (last 3)  Recent Labs    05/13/22 0313 05/13/22 0724 05/13/22 1113  GLUCAP 100* 106* 116*     Meds Scheduled Meds:  Chlorhexidine Gluconate Cloth  6 each Topical Daily   enoxaparin (LOVENOX) injection  40 mg  Subcutaneous Daily   feeding supplement (PROSource TF20)  60 mL Per Tube BID   free water  100 mL Per Tube Q6H   insulin aspart  0-24 Units Subcutaneous Q4H   lactulose  20 g Per Tube Daily   olopatadine  1 drop Both Eyes BID   mouth rinse  15 mL Mouth Rinse 4 times per day   pantoprazole (PROTONIX) IV  40 mg Intravenous Q12H   Continuous Infusions:  0.9 % NaCl with KCl 20 mEq / L 75 mL/hr at 05/13/22 09323  feeding supplement (OSMOLITE 1.5 CAL) 50 mL/hr at 05/13/22 0355   fluconazole (DIFLUCAN) IV 200 mg (05/12/22 1102)   piperacillin-tazobactam (ZOSYN)  IV 3.375 g (05/13/22 0554)   PRN Meds:.acetaminophen (TYLENOL) oral liquid 160 mg/5 mL, hydrALAZINE, HYDROcodone-acetaminophen, morphine injection, ondansetron (ZOFRAN) IV, mouth rinse  Xrays No results found.  Assessment/Plan: S/P Procedure(s) (LRB): LAPAROSCOPIC JEJUNOSTOMY (N/A) ESOPHAGOGASTRODUODENOSCOPY (EGD) (N/A) VIDEO ASSISTED THORACOSCOPY (Right)   1 afebrile, vital signs variable with systolic blood pressure 1557Dto 170s. Has PRN hydralazine. 2 oxygen saturations okay on 3 L 3 JP drains-79m24 h thin serous fluid 4 no leukocytosis and Hct is stable. Will check pre-albumin on Monday 5 advancing tube feeds per dietician 6 remains on Diflucan and Zosyn.  OR cultures negative to date.  7 continue to work on mobility with PT / OT 8  On lovenox for DVT ppx   LOS: 8 days    Joline Maxcy [phone] 05/13/2022  Patient seen and examined, agree with above  Remo Lipps C. Roxan Hockey, MD [facility] [phone]

## 2022-05-13 NOTE — Progress Notes (Signed)
Mobility Specialist Progress Note    05/13/22 1056  Mobility  Activity Ambulated with assistance in hallway  Level of Assistance Standby assist, set-up cues, supervision of patient - no hands on  Assistive Device Four wheel walker  Distance Ambulated (ft) 360 ft (180+180)  Activity Response Tolerated well  Mobility Referral Yes  $Mobility charge 1 Mobility   Pre-Mobility: 73 HR, 124/92 (103) BP, 99% SpO2 During Mobility: 125 HR, >/=88% SpO2 on RA Post-Mobility: 82 HR, 96% SpO2  Pt received in chair and agreeable. No complaints on walk. Took x1 seated rest break for ~3 minutes. Encouraged pursed lip breathing and tolerated on RA. Returned to BR to attempt BM. Encouraged to pull string when ready to get up. RN aware.   Hildred Alamin Mobility Specialist  Secure Chat Only

## 2022-05-14 LAB — GLUCOSE, CAPILLARY
Glucose-Capillary: 123 mg/dL — ABNORMAL HIGH (ref 70–99)
Glucose-Capillary: 119 mg/dL — ABNORMAL HIGH (ref 70–99)
Glucose-Capillary: 140 mg/dL — ABNORMAL HIGH (ref 70–99)
Glucose-Capillary: 122 mg/dL — ABNORMAL HIGH (ref 70–99)
Glucose-Capillary: 132 mg/dL — ABNORMAL HIGH (ref 70–99)
Glucose-Capillary: 97 mg/dL (ref 70–99)

## 2022-05-14 MED ORDER — GUAIFENESIN 100 MG/5ML PO LIQD
15.0000 mL | ORAL | Status: DC | PRN
  Administered 2022-05-14 – 2022-06-03 (×15): 15 mL
  Filled 2022-05-14 (×16): qty 20

## 2022-05-14 MED ORDER — CLONIDINE HCL 0.2 MG/24HR TD PTWK
0.2000 mg | MEDICATED_PATCH | TRANSDERMAL | Status: DC
  Administered 2022-05-14: 0.2 mg via TRANSDERMAL
  Filled 2022-05-14: qty 1

## 2022-05-14 NOTE — Progress Notes (Signed)
5 Days Post-Op Procedure(s) (LRB): LAPAROSCOPIC JEJUNOSTOMY (N/A) ESOPHAGOGASTRODUODENOSCOPY (EGD) (N/A) VIDEO ASSISTED THORACOSCOPY (Right) Subjective: Some incisional pain, trying to avoid morphine as much as possible  Objective: Vital signs in last 24 hours: Temp:  [97.6 F (36.4 C)-98.1 F (36.7 C)] 97.6 F (36.4 C) (11/05 0746) Pulse Rate:  [63-79] 75 (11/05 0746) Cardiac Rhythm: Heart block (11/05 0700) Resp:  [18-24] 24 (11/05 0746) BP: (128-174)/(83-91) 174/87 (11/05 0746) SpO2:  [93 %-100 %] 100 % (11/05 0746)  Hemodynamic parameters for last 24 hours:    Intake/Output from previous day: 11/04 0701 - 11/05 0700 In: 3962.1 [I.V.:1864.8; NG/GT:1768.8; IV Piggyback:268.4] Out: 2700 [Urine:2675; Drains:25] Intake/Output this shift: No intake/output data recorded.  General appearance: alert, cooperative, and no distress Neurologic: tremor Heart: regular rate and rhythm Lungs: diminished breath sounds bibasilar Abdomen: soft Wound: some drainage noted around JT earlier, site clean with no erythema  Lab Results: Recent Labs    05/13/22 0345  WBC 3.8*  HGB 8.7*  HCT 29.5*  PLT 215   BMET:  Recent Labs    05/13/22 0345  NA 146*  K 3.9  CL 113*  CO2 27  GLUCOSE 119*  BUN 21  CREATININE 0.99  CALCIUM 12.4*    PT/INR: No results for input(s): "LABPROT", "INR" in the last 72 hours. ABG    Component Value Date/Time   PHART 7.450 05/09/2022 0824   HCO3 25.8 05/09/2022 0824   TCO2 27 05/09/2022 0824   ACIDBASEDEF 0.3 04/06/2022 0332   O2SAT 100 05/09/2022 0824   CBG (last 3)  Recent Labs    05/13/22 2302 05/14/22 0327 05/14/22 0749  GLUCAP 107* 123* 119*    Assessment/Plan: S/P Procedure(s) (LRB): LAPAROSCOPIC JEJUNOSTOMY (N/A) ESOPHAGOGASTRODUODENOSCOPY (EGD) (N/A) VIDEO ASSISTED THORACOSCOPY (Right) - Esophageal leak- stent in place, NPO On Zosyn and Diflucan TF for protein calorie malnutrition Hypertension remains problematic.  Since  NPO will add clonidine patch Mobilize    LOS: 9 days    Keith Moreno 05/14/2022

## 2022-05-14 NOTE — Plan of Care (Signed)
  Problem: Education: Goal: Knowledge of General Education information will improve Description: Including pain rating scale, medication(s)/side effects and non-pharmacologic comfort measures 05/14/2022 2101 by Shanon Ace, RN Outcome: Progressing 05/14/2022 2101 by Shanon Ace, RN Outcome: Progressing   Problem: Health Behavior/Discharge Planning: Goal: Ability to manage health-related needs will improve 05/14/2022 2101 by Shanon Ace, RN Outcome: Progressing 05/14/2022 2101 by Shanon Ace, RN Outcome: Progressing   Problem: Clinical Measurements: Goal: Ability to maintain clinical measurements within normal limits will improve 05/14/2022 2101 by Shanon Ace, RN Outcome: Progressing 05/14/2022 2101 by Shanon Ace, RN Outcome: Progressing Goal: Will remain free from infection 05/14/2022 2101 by Shanon Ace, RN Outcome: Progressing 05/14/2022 2101 by Shanon Ace, RN Outcome: Progressing Goal: Diagnostic test results will improve 05/14/2022 2101 by Shanon Ace, RN Outcome: Progressing 05/14/2022 2101 by Shanon Ace, RN Outcome: Progressing Goal: Respiratory complications will improve Outcome: Progressing Goal: Cardiovascular complication will be avoided Outcome: Progressing   Problem: Activity: Goal: Risk for activity intolerance will decrease Outcome: Progressing   Problem: Nutrition: Goal: Adequate nutrition will be maintained Outcome: Progressing   Problem: Coping: Goal: Level of anxiety will decrease Outcome: Progressing   Problem: Elimination: Goal: Will not experience complications related to bowel motility Outcome: Progressing Goal: Will not experience complications related to urinary retention Outcome: Progressing   Problem: Pain Managment: Goal: General experience of comfort will improve Outcome: Progressing   Problem: Safety: Goal: Ability to remain free from injury will improve Outcome: Progressing   Problem: Skin Integrity: Goal: Risk  for impaired skin integrity will decrease Outcome: Progressing   Problem: Education: Goal: Knowledge of the prescribed therapeutic regimen will improve Outcome: Progressing   Problem: Bowel/Gastric: Goal: Gastrointestinal status for postoperative course will improve Outcome: Progressing   Problem: Nutritional: Goal: Ability to achieve adequate nutritional intake will improve Outcome: Progressing   Problem: Clinical Measurements: Goal: Postoperative complications will be avoided or minimized Outcome: Progressing   Problem: Respiratory: Goal: Ability to maintain a clear airway will improve Outcome: Progressing   Problem: Skin Integrity: Goal: Demonstration of wound healing without infection will improve Outcome: Progressing

## 2022-05-15 LAB — BASIC METABOLIC PANEL
Anion gap: 8 (ref 5–15)
BUN: 19 mg/dL (ref 8–23)
CO2: 29 mmol/L (ref 22–32)
Calcium: 12.1 mg/dL — ABNORMAL HIGH (ref 8.9–10.3)
Chloride: 105 mmol/L (ref 98–111)
Creatinine, Ser: 1.05 mg/dL (ref 0.61–1.24)
GFR, Estimated: >60 mL/min (ref >60)
Glucose, Bld: 114 mg/dL — ABNORMAL HIGH (ref 70–99)
Potassium: 4.3 mmol/L (ref 3.5–5.1)
Sodium: 142 mmol/L (ref 135–145)

## 2022-05-15 LAB — GLUCOSE, CAPILLARY
Glucose-Capillary: 103 mg/dL — ABNORMAL HIGH (ref 70–99)
Glucose-Capillary: 106 mg/dL — ABNORMAL HIGH (ref 70–99)
Glucose-Capillary: 106 mg/dL — ABNORMAL HIGH (ref 70–99)
Glucose-Capillary: 115 mg/dL — ABNORMAL HIGH (ref 70–99)
Glucose-Capillary: 116 mg/dL — ABNORMAL HIGH (ref 70–99)
Glucose-Capillary: 130 mg/dL — ABNORMAL HIGH (ref 70–99)

## 2022-05-15 LAB — PREALBUMIN: Prealbumin: 17 mg/dL — ABNORMAL LOW (ref 18–38)

## 2022-05-15 NOTE — Progress Notes (Addendum)
      [address]       [place] [address]             [phone]       6 Days Post-Op Procedure(s) (LRB): LAPAROSCOPIC JEJUNOSTOMY (N/A) ESOPHAGOGASTRODUODENOSCOPY (EGD) (N/A) VIDEO ASSISTED THORACOSCOPY (Right)  Subjective: Patient "coughing up mucous plugs" and has loose stools. He denies nausea or vomiting. One of JP drains was pulled on this weekend.   Objective: Vital signs in last 24 hours: Temp:  [97.6 F (36.4 C)-98.5 F (36.9 C)] 98.1 F (36.7 C) (11/06 0300) Pulse Rate:  [61-76] 76 (11/06 0300) Cardiac Rhythm: Normal sinus rhythm;Bundle branch block (11/05 1903) Resp:  [16-24] 20 (11/06 0300) BP: (160-178)/(78-91) 160/78 (11/06 0300) SpO2:  [96 %-100 %] 96 % (11/06 0300)     Intake/Output from previous day: 11/05 0701 - 11/06 0700 In: 4265.5 [I.V.:1804.1; NG/GT:2095.2; IV Piggyback:266.2] Out: 2236 [Urine:2201; Drains:35]   Physical Exam:  Cardiovascular: RRR Pulmonary: Clear to auscultation bilaterally; Abdomen: Soft, non tender, bowel sounds present. Extremities: Trace bilateral lower extremity edema. Wounds: Clean and dry.  No erythema or signs of infection.   Lab Results: CBC: Recent Labs    05/13/22 0345  WBC 3.8*  HGB 8.7*  HCT 29.5*  PLT 215   BMET:  Recent Labs    05/13/22 0345  NA 146*  K 3.9  CL 113*  CO2 27  GLUCOSE 119*  BUN 21  CREATININE 0.99  CALCIUM 12.4*    PT/INR: No results for input(s): "LABPROT", "INR" in the last 72 hours. ABG:  INR: Will add last result for INR, ABG once components are confirmed Will add last 4 CBG results once components are confirmed  Assessment/Plan:  1. CV - SR, hypertensive. Clonidine patch 0.2 mg added yesterday. Monitor BP as may need to increase or add additional medication. 2.  Pulmonary - On 2 liters of oxygen via [place]. Wean as able. He may need home oxygen. JP drains with serous output. JP drain under liver appears to have been pulled back. Will discuss with Dr.  Kipp Brood. Encourage incentive spirometer 3. Anemia-Last H and H 8.7 and 29.5 4. GI-NPO. On IVF at 75 ml/hr. CBGs 132/97/106. Tube feedings at 70 ml/hr. Stop stool softeners and IVFs 5. ID-Zosyn  6. Deconditioned-continue PT 7. On Lovenox for DVT  Donielle M ZimmermanPA-C 05/15/2022,6:57 AM  Agree with above Stopping IV fluids Continue tube feeds Checking nutrition labs  Antoniette Peake O [place]

## 2022-05-15 NOTE — TOC Progression Note (Signed)
Transition of Care [facility]) - Progression Note    Patient Details  Name: [name] MRN: [id number] Date of Birth: 11-21-1960  Transition of Care [facility]) CM/SW Contact  Jacalyn Lefevre Edson Snowball, RN Phone Number: 05/15/2022, 3:32 PM  Clinical Narrative:     NCM left vm for [name] [phone] ext [phone] at the [facility] for return call to follow up on  hospital bed and bariatric shower chair for home . Once home tube feeding determined will send order to [facility]  Expected Discharge Plan: [place] Barriers to Discharge: Continued Medical Work up  Expected Discharge Plan and Services Expected Discharge Plan: [place]   Discharge Planning Services: CM Consult Post Acute Care Choice: [place] arrangements for the past 2 months: Single Family Home                 DME Arranged:  (see note)         HH Arranged: RN, PT, OT HH Agency: [place] Date [facility] Agency Contacted: 05/08/22 Time HH Agency Contacted: 33 Representative spoke with at [place]: cory   Social Determinants of Health ([place]) Interventions    Readmission Risk Interventions     No data to display

## 2022-05-15 NOTE — Progress Notes (Signed)
Occupational Therapy Treatment Patient Details Name: Keith Moreno MRN: [id number] DOB: 02-08-1961 Today's Date: 05/15/2022   History of present illness Pt is 61 y/o M who presents to [facility] on 05/05/22 from cancer center with weakness, dizziness, difficulty swalling, admitted for hypercalcemia. 10/31 esophagogastroscopy and VATS. Of note,  pt with recent prolonged hospitalization (9/27-10/20) after esophagectomy  9/27 with placement of jejunostomy tube. PMH to include esophageal CA, HTN, sacral wound.   OT comments  Sriram is making steady progress however he did demonstrate some self limiting behaviors and declined ADLs and further mobility as he just returned from a walk with MS. Admits that "motivation" is a limiting factor for him. Agreeable to sit<>stands from the chair with superivsion A and RW. Pillows paced on chair and asked secretary to order geo-mat to prevent further skin breakdown, educated pt on frequent positional changes for skin integrity. OT to continue to follow acutely. POC remains appropriate.    Recommendations for follow up therapy are one component of a multi-disciplinary discharge planning process, led by the attending physician.  Recommendations may be updated based on patient status, additional functional criteria and insurance authorization.    Follow Up Recommendations  Home health OT    Assistance Recommended at Discharge Intermittent Supervision/Assistance  Patient can return home with the following  A little help with walking and/or transfers;A little help with bathing/dressing/bathroom;Assistance with cooking/housework;Assist for transportation;Help with stairs or ramp for entrance   Equipment Recommendations  None recommended by OT       Precautions / Restrictions Precautions Precautions: Fall;Other (comment) Precaution Comments: JP drain x2, tube feed Restrictions Weight Bearing Restrictions: No       Mobility Bed Mobility                General bed mobility comments: OOB upon arrival    Transfers Overall transfer level: Needs assistance Equipment used: Rolling walker (2 wheels) Transfers: Sit to/from Stand Sit to Stand: Supervision                 Balance Overall balance assessment: Needs assistance Sitting-balance support: Feet supported, No upper extremity supported Sitting balance-Leahy Scale: Good     Standing balance support: Single extremity supported, Bilateral upper extremity supported, During functional activity Standing balance-Leahy Scale: Fair                             ADL either performed or assessed with clinical judgement   ADL Overall ADL's : Needs assistance/impaired                         Toilet Transfer: Supervision/safety;Ambulation;Rolling walker (2 wheels) Toilet Transfer Details (indicate cue type and reason): simulated         Functional mobility during ADLs: Supervision/safety;Rolling walker (2 wheels) General ADL Comments: self limiting, pt declining ADLs and further mobility as he had just returned ot room after ambulation wit MS    Extremity/Trunk Assessment Upper Extremity Assessment Upper Extremity Assessment: Generalized weakness   Lower Extremity Assessment Lower Extremity Assessment: Defer to PT evaluation        Vision   Vision Assessment?: No apparent visual deficits   Perception Perception Perception: Not tested   Praxis      Cognition Arousal/Alertness: Awake/alert Behavior During Therapy: WFL for tasks assessed/performed, Flat affect Overall Cognitive Status: Impaired/Different from baseline Area of Impairment: Safety/judgement, Problem solving  Safety/Judgement: Decreased awareness of deficits   Problem Solving: Requires verbal cues, Requires tactile cues, Slow processing General Comments: Good awareness of lines, able to vocalize appropriate steps to hold tube feed. However, self  limiting behavoirs for activity tolerance. Admits "motivation" his a limiting factor for him        Exercises Exercises:  (pt declined exercises)       General Comments VSS    Pertinent Vitals/ Pain       Pain Assessment Pain Assessment: Faces Faces Pain Scale: Hurts a little bit Pain Location: rear peri, skin breakdown Pain Descriptors / Indicators: Discomfort Pain Intervention(s): Limited activity within patient's tolerance, Monitored during session   Frequency  Min 2X/week        Progress Toward Goals  OT Goals(current goals can now be found in the care plan section)  Progress towards OT goals: Progressing toward goals  Acute Rehab OT Goals Patient Stated Goal: to feel better OT Goal Formulation: With patient Time For Goal Achievement: 05/25/22 Potential to Achieve Goals: Good ADL Goals Pt Will Perform Grooming: with modified independence;standing Pt Will Perform Upper Body Bathing: with set-up;sitting Pt Will Perform Lower Body Bathing: with modified independence;sit to/from stand Pt Will Perform Upper Body Dressing: with set-up;sitting;standing Pt Will Perform Lower Body Dressing: with modified independence;sit to/from stand Pt Will Transfer to Toilet: with modified independence;regular height toilet;ambulating Pt/caregiver will Perform Home Exercise Program: Increased strength;Both right and left upper extremity;With theraband;With written HEP provided Additional ADL Goal #1: pt will indep recall at least 2 energy conservation strategies to apply in the home setting  Plan Discharge plan remains appropriate       AM-PAC OT "6 Clicks" Daily Activity     Outcome Measure   Help from another person eating meals?: Total Help from another person taking care of personal grooming?: A Little Help from another person toileting, which includes using toliet, bedpan, or urinal?: A Little Help from another person bathing (including washing, rinsing, drying)?: A Lot Help  from another person to put on and taking off regular upper body clothing?: A Little Help from another person to put on and taking off regular lower body clothing?: A Lot 6 Click Score: 14    End of Session Equipment Utilized During Treatment: Rolling walker (2 wheels)  OT Visit Diagnosis: Unsteadiness on feet (R26.81);Muscle weakness (generalized) (M62.81)   Activity Tolerance Patient tolerated treatment well   Patient Left in chair;with call bell/phone within reach;with family/visitor present   Nurse Communication Mobility status        Time: 6440-3474 OT Time Calculation (min): 12 min  Charges: OT General Charges $OT Visit: 1 Visit OT Treatments $Therapeutic Activity: 8-22 mins    Elliot Cousin 05/15/2022, 3:41 PM

## 2022-05-15 NOTE — Progress Notes (Signed)
   05/15/22 1511  Mobility  Activity Ambulated with assistance in hallway  Level of Assistance Contact guard assist, steadying assist  Assistive Device Four wheel walker  Distance Ambulated (ft) 360 ft  Activity Response Tolerated well  Mobility Referral Yes  $Mobility charge 1 Mobility   Mobility Specialist Progress Note  Pre-Mobility:  95% SpO2 During Mobility: 125HR, 87-92%SpO2 Post-Mobility: 94% SpO2  Pt was in chair and agreeable. X1 setaed break d/t fatigue. X2 standing breaks d/t SOB but recovered after being coached through pursed lip breathing. Returned to chair w/ all needs met and call bell in reach.   Lucious Groves Mobility Specialist

## 2022-05-16 LAB — GLUCOSE, CAPILLARY
Glucose-Capillary: 107 mg/dL — ABNORMAL HIGH (ref 70–99)
Glucose-Capillary: 127 mg/dL — ABNORMAL HIGH (ref 70–99)
Glucose-Capillary: 130 mg/dL — ABNORMAL HIGH (ref 70–99)
Glucose-Capillary: 137 mg/dL — ABNORMAL HIGH (ref 70–99)
Glucose-Capillary: 140 mg/dL — ABNORMAL HIGH (ref 70–99)
Glucose-Capillary: 155 mg/dL — ABNORMAL HIGH (ref 70–99)

## 2022-05-16 MED ORDER — HYDRALAZINE HCL 20 MG/ML IJ SOLN
10.0000 mg | Freq: Three times a day (TID) | INTRAMUSCULAR | Status: DC
  Administered 2022-05-16 – 2022-05-17 (×3): 10 mg via INTRAVENOUS
  Filled 2022-05-16 (×3): qty 1

## 2022-05-16 NOTE — Progress Notes (Addendum)
      [address]       ,[place] [address]             [phone]       7 Days Post-Op Procedure(s) (LRB): LAPAROSCOPIC JEJUNOSTOMY (N/A) ESOPHAGOGASTRODUODENOSCOPY (EGD) (N/A) VIDEO ASSISTED THORACOSCOPY (Right)  Subjective: Patient did not sleep well.  Objective: Vital signs in last 24 hours: Temp:  [97.6 F (36.4 C)-99.3 F (37.4 C)] 98.4 F (36.9 C) (11/07 0355) Pulse Rate:  [73-90] 87 (11/07 0355) Cardiac Rhythm: Junctional rhythm;Heart block;Bundle branch block (11/06 2001) Resp:  [18-23] 20 (11/07 0355) BP: (161-180)/(84-102) 166/84 (11/07 0355) SpO2:  [94 %-96 %] 95 % (11/07 0355)     Intake/Output from previous day: 11/06 0701 - 11/07 0700 In: 3011.4 [I.V.:573.3; NG/GT:2059; IV Piggyback:379.1] Out: 0932 [Urine:5100; Drains:35]   Physical Exam:  Cardiovascular: RRR Pulmonary: Clear to auscultation bilaterally Abdomen: Soft, non tender, bowel sounds present. Extremities: Trace bilateral lower extremity edema. Wounds: Clean and dry.  No erythema or signs of infection. Drains: Serous output. Abdominal drain is barely in  Lab Results: CBC: No results for input(s): "WBC", "HGB", "HCT", "PLT" in the last 72 hours.  BMET:  Recent Labs    05/15/22 0800  NA 142  K 4.3  CL 105  CO2 29  GLUCOSE 114*  BUN 19  CREATININE 1.05  CALCIUM 12.1*     PT/INR: No results for input(s): "LABPROT", "INR" in the last 72 hours. ABG:  INR: Will add last result for INR, ABG once components are confirmed Will add last 4 CBG results once components are confirmed  Assessment/Plan:  1. CV - First degree heart block,hypertensive. Clonidine patch 0.2 mg added yesterday. Hydralazine IV has been on order PRN but will schedule to help with BP. Once able to take liquids, will resume Amlodipine 10 mg daily, Lisinopril 40 mg daily. 2.  Pulmonary - On 2 liters of oxygen via [place]. Wean as able. He may need home oxygen. JP drains with serous output. JP drain  under liver is barely in (black dot on outside). Encourage incentive spirometer 3. Anemia-Last H and H 8.7 and 29.5 4. GI-NPO. CBGs 130/103/140. Tube feedings at 70 ml/hr. 5. ID-Zosyn  6. Deconditioned-continue PT 7. On Lovenox for DVT  Donielle M ZimmermanPA-C 05/16/2022,6:57 AM   Agree with above Pre-albumin remain low.  Continue tube feeds Continue NPO for now Continue abx and diflucan  Yasir Kitner O Ahnna Dungan

## 2022-05-16 NOTE — Progress Notes (Signed)
Pt placed on overnight pulse ox.

## 2022-05-16 NOTE — Progress Notes (Signed)
Physical Therapy Treatment Patient Details Name: Keith Moreno MRN: [id number] DOB: 07/15/1960 Today's Date: 05/16/2022   History of Present Illness Pt is 61 y/o male admitted 05/05/22 from cancer center with weakness, dizziness, difficulty swallowing, admitted for hypercalcemia. S/p esophagogastroscopy, VATS 10/31. Of note, pt with recent prolonged hospitalization (9/27-10/20) after esophagectomy with placement of J-tube. Other PMH includes esophageal CA, HTN, sacral wound.   PT Comments    Pt progressing with mobility; flat affect this session, endorses increased fatigue. Today's session focused on ambulation for improving activity tolerance; pt declines further mobility. SpO2 >/92% on RA with activity, DOE 3-4/4 requiring seated rest breaks. Pt's wife present and supportive; increased time discussing return home and activity recommendations. Pt remains limited by generalized weakness, decreased activity tolerance, and impaired balance strategies/postural reactions. Will continue to follow acutely.  SpO2 >/92% on RA HR 105-137    Recommendations for follow up therapy are one component of a multi-disciplinary discharge planning process, led by the attending physician.  Recommendations may be updated based on patient status, additional functional criteria and insurance authorization.  Follow Up Recommendations  Home health PT     Assistance Recommended at Discharge Intermittent Supervision/Assistance  Patient can return home with the following A little help with bathing/dressing/bathroom;Assistance with cooking/housework;Assist for transportation;Help with stairs or ramp for entrance   Equipment Recommendations  None recommended by PT    Recommendations for Other Services       Precautions / Restrictions Precautions Precautions: Fall;Other (comment) Precaution Comments: JP drain; J-tube Restrictions Weight Bearing Restrictions: No     Mobility  Bed Mobility                General bed mobility comments: received sitting in recliner    Transfers Overall transfer level: Needs assistance Equipment used: Rollator (4 wheels) Transfers: Sit to/from Stand Sit to Stand: Modified independent (Device/Increase time)           General transfer comment: mod indep sit<>stand from recliner to rollator, pt opting not to lock brakes despite cues; mod indep sit<>stand from rollator seat in hallway, pt pushing device against wall and locking brakes prior to sitting without cues    Ambulation/Gait Ambulation/Gait assistance: Supervision Gait Distance (Feet): 180 Feet (+ 180') Assistive device: Rollator (4 wheels) Gait Pattern/deviations: Step-through pattern, Decreased stride length, Trunk flexed Gait velocity: Decreased     General Gait Details: slow, fatigued gait with rollator and supervision for safety/lines; DOE 3/4, pt minimally talkative reports secondary to SOB and fatigue; 1x seated rest break secondary to fatigue and dyspnea   Stairs             Wheelchair Mobility    Modified Rankin (Stroke Patients Only)       Balance Overall balance assessment: Needs assistance Sitting-balance support: Feet supported, No upper extremity supported Sitting balance-Leahy Scale: Good     Standing balance support: Single extremity supported, Bilateral upper extremity supported, During functional activity, No upper extremity supported Standing balance-Leahy Scale: Fair Standing balance comment: can static stand without UE support, preference for rollator                            Cognition Arousal/Alertness: Awake/alert Behavior During Therapy: Flat affect Overall Cognitive Status: Impaired/Different from baseline                                 General Comments: very flat affect  this session. pt slow to respond verbally in conversation, endorses fatigue and SOB, suspect more related to this        Exercises       General Comments General comments (skin integrity, edema, etc.): pt's wife present, supportive. increased time discussing activity recommendations for home (including shorter bouts of activity, more frequently throughout day), epsecially since pt seems extremely fatigued after long hallway ambulation distance. SpO2 >/92% on RA with activity; DOE 3-4/4. cues for pursed lip breathing but pt reports nose feels "clogged" making inhalation through nose difficult      Pertinent Vitals/Pain Pain Assessment Pain Assessment: Faces Faces Pain Scale: Hurts a little bit Pain Location: generalized Pain Descriptors / Indicators: Tiring Pain Intervention(s): Monitored during session    Home Living                          Prior Function            PT Goals (current goals can now be found in the care plan section) Progress towards PT goals: Progressing toward goals    Frequency    Min 3X/week      PT Plan Current plan remains appropriate    Co-evaluation              AM-PAC PT "6 Clicks" Mobility   Outcome Measure  Help needed turning from your back to your side while in a flat bed without using bedrails?: A Little Help needed moving from lying on your back to sitting on the side of a flat bed without using bedrails?: A Little Help needed moving to and from a bed to a chair (including a wheelchair)?: A Little Help needed standing up from a chair using your arms (e.g., wheelchair or bedside chair)?: None Help needed to walk in hospital room?: A Little Help needed climbing 3-5 steps with a railing? : A Little 6 Click Score: 19    End of Session Equipment Utilized During Treatment: Gait belt;Oxygen Activity Tolerance: Patient tolerated treatment well;Patient limited by fatigue Patient left: in chair;with call bell/phone within reach;with family/visitor present Nurse Communication: Mobility status PT Visit Diagnosis: Other abnormalities of gait and mobility  (R26.89);Difficulty in walking, not elsewhere classified (R26.2);Muscle weakness (generalized) (M62.81)     Time: 5053-9767 PT Time Calculation (min) (ACUTE ONLY): 31 min  Charges:  $Therapeutic Exercise: 8-22 mins $Self Care/Home Management: [place], PT, DPT Acute Rehabilitation Services  Personal: [place] [facility] Office: [place] 05/16/2022, 5:48 PM

## 2022-05-16 NOTE — TOC Progression Note (Addendum)
Transition of Care [facility]) - Progression Note    Patient Details  Name: Keith Moreno MRN: [id number] Date of Birth: 06/07/1961  [facility]) CM/SW Contact  Jacalyn Lefevre Edson Snowball, RN Phone Number: 05/16/2022, 11:09 AM  Clinical Narrative:     Spoke to patient and wife at bedside. They have not heard from [place] regarding hospital bed or 3 in 1 .   In progression today discussed that he will need tube feeding continuous at home for 6 months. They have jevity at home, will need osmolite.   Secure chatted dietitian she will see patient today to see and make recommendations for home. Once complete, NCM will enter order and send to [place].   Patient currently on oxygen does not have it at home. Patient stated at last discharge he passed the ambulation oxygen saturation test , however,  he would wake up in middle of night short of breath and sats in low 80's. Secure chatted PA to see if patient needs  overnight pulse ox study . PA will order   Patient and wife aware of all of above and voiced understanding.   1413 Await updated dietitian note to order tube feeds   1420 Spoke to [place] worker [phone] ext [phone] at the [facility] . They have received the order for bariatric bedside commode on 05/08/22 , it takes 5 to 7 business days for delivery, they received order for hospital bed on 05/10/22 and it takes 7 to 10 business days for delivery.   43 Talked to RD and PA, undetermined at this point what tube feeding  rate will be at discharge . Patient will  require a feeding pump and 7 cartons of Osmolite 1.5 per day . Entered order and sent to [email or web address]. Also faxed copy to [facility] P[facility] Lorelle Formosa fax [phone]   Tube feeding and pump order also sent to [email or web address]  [facility] is patient's home health agency  Expected Discharge Plan: [place] Barriers to Discharge: Continued Medical Work  up  Expected Discharge Plan and Services Expected Discharge Plan: [place]   Discharge Planning Services: CM Consult Post Acute Care Choice: [place] arrangements for the past 2 months: Single Family Home                 DME Arranged:  (see note)         HH Arranged: RN, PT, OT HH Agency: [place] Date [facility] Agency Contacted: 05/08/22 Time [place]: 12 Representative spoke with at [place]: cory   Social Determinants of Health ([place]) Interventions    Readmission Risk Interventions     No data to display

## 2022-05-16 NOTE — Plan of Care (Signed)
  Problem: Education: Goal: Knowledge of General Education information will improve Description: Including pain rating scale, medication(s)/side effects and non-pharmacologic comfort measures Outcome: Progressing   Problem: Health Behavior/Discharge Planning: Goal: Ability to manage health-related needs will improve Outcome: Progressing   Problem: Clinical Measurements: Goal: Ability to maintain clinical measurements within normal limits will improve Outcome: Progressing Goal: Will remain free from infection Outcome: Progressing Goal: Diagnostic test results will improve Outcome: Progressing Goal: Respiratory complications will improve Outcome: Progressing Goal: Cardiovascular complication will be avoided Outcome: Progressing   Problem: Activity: Goal: Risk for activity intolerance will decrease Outcome: Progressing   Problem: Nutrition: Goal: Adequate nutrition will be maintained Outcome: Progressing   Problem: Coping: Goal: Level of anxiety will decrease Outcome: Progressing   Problem: Elimination: Goal: Will not experience complications related to bowel motility Outcome: Progressing Goal: Will not experience complications related to urinary retention Outcome: Progressing   Problem: Pain Managment: Goal: General experience of comfort will improve Outcome: Progressing   Problem: Safety: Goal: Ability to remain free from injury will improve Outcome: Progressing   Problem: Skin Integrity: Goal: Risk for impaired skin integrity will decrease Outcome: Progressing   Problem: Education: Goal: Knowledge of the prescribed therapeutic regimen will improve Outcome: Progressing   Problem: Bowel/Gastric: Goal: Gastrointestinal status for postoperative course will improve Outcome: Progressing   Problem: Nutritional: Goal: Ability to achieve adequate nutritional intake will improve Outcome: Progressing   Problem: Clinical Measurements: Goal: Postoperative  complications will be avoided or minimized Outcome: Progressing   Problem: Respiratory: Goal: Ability to maintain a clear airway will improve Outcome: Progressing   Problem: Skin Integrity: Goal: Demonstration of wound healing without infection will improve Outcome: Progressing

## 2022-05-16 NOTE — Progress Notes (Signed)
Mobility Specialist Progress Note    05/16/22 1159  Mobility  Activity Ambulated with assistance in hallway  Level of Assistance Standby assist, set-up cues, supervision of patient - no hands on  Assistive Device Four wheel walker  Distance Ambulated (ft) 360 ft (180+180)  Activity Response Tolerated well  Mobility Referral Yes  $Mobility charge 1 Mobility   Pre-Mobility: 82 HR, 177/97 (121) BP, 92% SpO2 During Mobility: 125 HR Post-Mobility: 89 HR, 98% SpO2  Pt received in chair and agreeable. No complaints on walk. On 2LO2. Took x1 seated rest break for <2 minutes. Returned to chair with call bell in reach.    Hildred Alamin Mobility Specialist  Secure Chat Only

## 2022-05-16 NOTE — Plan of Care (Signed)
  Problem: Education: Goal: Knowledge of General Education information will improve Description: Including pain rating scale, medication(s)/side effects and non-pharmacologic comfort measures Outcome: Progressing   Problem: Health Behavior/Discharge Planning: Goal: Ability to manage health-related needs will improve Outcome: Progressing   Problem: Clinical Measurements: Goal: Ability to maintain clinical measurements within normal limits will improve Outcome: Progressing Goal: Will remain free from infection Outcome: Progressing Goal: Diagnostic test results will improve Outcome: Progressing Goal: Respiratory complications will improve Outcome: Progressing Goal: Cardiovascular complication will be avoided Outcome: Progressing   Problem: Activity: Goal: Risk for activity intolerance will decrease Outcome: Progressing   Problem: Nutrition: Goal: Adequate nutrition will be maintained Outcome: Progressing   Problem: Coping: Goal: Level of anxiety will decrease Outcome: Progressing   Problem: Elimination: Goal: Will not experience complications related to bowel motility Outcome: Progressing Goal: Will not experience complications related to urinary retention Outcome: Progressing   Problem: Pain Managment: Goal: General experience of comfort will improve Outcome: Progressing   Problem: Safety: Goal: Ability to remain free from injury will improve Outcome: Progressing   Problem: Skin Integrity: Goal: Risk for impaired skin integrity will decrease Outcome: Progressing   Problem: Education: Goal: Knowledge of the prescribed therapeutic regimen will improve Outcome: Progressing   Problem: Bowel/Gastric: Goal: Gastrointestinal status for postoperative course will improve Outcome: Progressing   Problem: Nutritional: Goal: Ability to achieve adequate nutritional intake will improve Outcome: Progressing   Problem: Clinical Measurements: Goal: Postoperative  complications will be avoided or minimized Outcome: Progressing   Problem: Respiratory: Goal: Ability to maintain a clear airway will improve Outcome: Progressing

## 2022-05-16 NOTE — Progress Notes (Signed)
Brief Nutrition Follow-up:  Dietitian Consult received for TF recs for d/c as pt will be NPO and will require TF for at least 6 months.   RD visited pt today, wife present.   Currently tolerating Osmolite 1.5 at 70 ml/hr (1680 mL in 24 hours). Current TF infuses 24 hours per day.   RD attempted to discuss alternative feeding options with pt and wife as it is not ideal to be connected to a feeding pump 24 hours a day, 7 days a week. The ideal situation with regards to a J-tube is allowing the pt a break, at least a few hours a day (if not more), when pt will not be connected and can account for things like MD visits, etc. Patient's with Jejunal tubes are not appropriate for bolus feedings. This can be done by doing nocturnal OR day time feedings only or combination of the 2. TF can be cycled over 14, 16 or 18 hours.  RD plans to follow-up and discuss these options further with pt and his wife.   However, at discharge pt goal is 1680 mL (or 7 cartons) of Osmolite 1.5 to be administered via feeding pump. This was discussed with TOC and PA.   TF Prescription Recommendations:  Formula: Osmolite 1.5  Volume: 7 cartons (237 mL each)  Modality: administer via feeding pump Enteral Access: J-tube  [name] MS, RDN, LDN, CNSC Registered Dietitian 3 Clinical Nutrition RD Pager and On-Call Pager Number Located in [place]

## 2022-05-17 ENCOUNTER — Inpatient Hospital Stay (HOSPITAL_COMMUNITY): Payer: No Typology Code available for payment source

## 2022-05-17 ENCOUNTER — Encounter: Payer: No Typology Code available for payment source | Admitting: Dietician

## 2022-05-17 LAB — CBC
HCT: 30.6 % — ABNORMAL LOW (ref 39.0–52.0)
Hemoglobin: 9.7 g/dL — ABNORMAL LOW (ref 13.0–17.0)
MCH: 28.4 pg (ref 26.0–34.0)
MCHC: 31.7 g/dL (ref 30.0–36.0)
MCV: 89.5 fL (ref 80.0–100.0)
Platelets: 201 10*3/uL (ref 150–400)
RBC: 3.42 MIL/uL — ABNORMAL LOW (ref 4.22–5.81)
RDW: 14.6 % (ref 11.5–15.5)
WBC: 5.4 10*3/uL (ref 4.0–10.5)
nRBC: 0 % (ref 0.0–0.2)

## 2022-05-17 LAB — GLUCOSE, CAPILLARY
Glucose-Capillary: 135 mg/dL — ABNORMAL HIGH (ref 70–99)
Glucose-Capillary: 132 mg/dL — ABNORMAL HIGH (ref 70–99)
Glucose-Capillary: 145 mg/dL — ABNORMAL HIGH (ref 70–99)
Glucose-Capillary: 156 mg/dL — ABNORMAL HIGH (ref 70–99)
Glucose-Capillary: 156 mg/dL — ABNORMAL HIGH (ref 70–99)
Glucose-Capillary: 163 mg/dL — ABNORMAL HIGH (ref 70–99)

## 2022-05-17 MED ORDER — FUROSEMIDE 10 MG/ML IJ SOLN
40.0000 mg | Freq: Every day | INTRAMUSCULAR | Status: DC
  Administered 2022-05-17 – 2022-05-18 (×2): 40 mg via INTRAVENOUS
  Filled 2022-05-17 (×3): qty 4

## 2022-05-17 MED ORDER — HYDRALAZINE HCL 20 MG/ML IJ SOLN
20.0000 mg | Freq: Three times a day (TID) | INTRAMUSCULAR | Status: DC
  Administered 2022-05-17: 20 mg via INTRAVENOUS
  Filled 2022-05-17: qty 1

## 2022-05-17 MED ORDER — CLONIDINE HCL 0.1 MG/24HR TD PTWK
0.1000 mg | MEDICATED_PATCH | TRANSDERMAL | Status: DC
  Administered 2022-06-04: 0.1 mg via TRANSDERMAL
  Filled 2022-05-17 (×3): qty 1

## 2022-05-17 MED ORDER — OSMOLITE 1.5 CAL PO LIQD
1000.0000 mL | ORAL | Status: DC
  Administered 2022-05-17 – 2022-05-18 (×2): 1000 mL
  Filled 2022-05-17: qty 1000

## 2022-05-17 MED ORDER — HYDRALAZINE HCL 20 MG/ML IJ SOLN
20.0000 mg | Freq: Three times a day (TID) | INTRAMUSCULAR | Status: DC
  Administered 2022-05-18 – 2022-06-05 (×53): 20 mg via INTRAVENOUS
  Filled 2022-05-17 (×54): qty 1

## 2022-05-17 MED ORDER — OSMOLITE 1.5 CAL PO LIQD: 1000.0000 mL | ORAL | Status: DC

## 2022-05-17 NOTE — Progress Notes (Signed)
Mobility Specialist Progress Note    05/17/22 1107  Mobility  Activity Ambulated with assistance in hallway  Level of Assistance Standby assist, set-up cues, supervision of patient - no hands on  Assistive Device Four wheel walker  Distance Ambulated (ft) 170 ft  Activity Response Tolerated well  Mobility Referral Yes  $Mobility charge 1 Mobility   Pre-Mobility: 99 HR, 143/103 (113) BP, 97% SpO2 During Mobility: 133 HR, >/=90% SpO2 Post-Mobility: 109 HR, 99% SpO2  Pt received in chair and agreeable. Requested to have O2 increased to 3LO2 before standing. No complaints. Returned to chair with dietician team present.   Hildred Alamin Mobility Specialist  Secure Chat Only

## 2022-05-17 NOTE — Progress Notes (Signed)
Brief nutrition Follow-up:  RD visited pt and wife again today to discuss possibility of changing to an 18 hour feeding (allowing 6 hour break from TF and the pump). This would allow pt "freedom" from the pump and would make mobility, performing ADLs and leaving the house easier as pt would not have to drag the TF and the pump with him everywhere. This is common practice in patients with J-tube feedings  Wife in particular is very worried of making any changes; she is "afraid" of anything bad happening at discharge. Initially pt and wife agreeable to plan to trial 18 hour feedings and planned to start this afternoon. RD later received message that wife does not feel good about making any changes and wants to continue 24 hour continuous feedings for now and wants to discuss with [name].   Recommendation was to change to Osmolite 1.5 at 95 ml/hr x 18 hours (infuse from 1600 to 1000 daily) which is equivalent nutritionally to 70 ml/hr x 24 hours. Pt would receive the same nutrition either way.   Of note, RD also received message from [facility] team as [facility] to Jevity 1.5 formula as pt has some of this already at home (although has never tried Jevity 1.5 formula per feeding tube). RD is ok with changing to Jevity 1.5 but pt would need to start Jevity 1.5 while in patient to see if he is able to tolerate this. Also someone would need to discuss with wife and pt to see if they are willing on trying a different formula.   Interventions:  After much discussion, wife wishes to continue Osmolite 1.5 at 70 ml/hr for 24 hours and wants to discuss further with [name]  RD to follow-up regarding [place] MS, RDN, LDN, CNSC Registered Dietitian 3 Clinical Nutrition RD Pager and On-Call Pager Number Located in [place]

## 2022-05-17 NOTE — Care Management (Signed)
Received call from [facility] department [phone] ext [phone]. Keith Moreno states that patient received a large supply of Jevity 1.5 on 03/10/22, with quite a bit remaining at home. She verbalized receiving the order for Osmolite 1.5 yesterday and notes no nutritional difference except the fiber content. Keith Moreno states that it would take 5-7 to get Osmolite delivered to the patient.  [facility] requesting that we consider changing feed to Jevity since they already have it at home.  Spoke w RD, who states that changes to rate were made today. Patient and wife very cautious even stating they were scared with making changes. RD states that there is a supply that is available for them to take home that would cover the delivery time of Osmolite from the [place].  Due to patient and wife's reluctance to trust changes and me not having established a therapeutic relationship with them, RD will approach changing the formula with the patient tomorrow since the rate changes were already done today. Since a back up plan is in place for Osmol;ite to be sent home with them, this will not delay DC plan.  Please call Keith Moreno at the [facility] Thursday to update her re patient decision to try Jevity again, or if she needs to go ahead and order the Osmolite

## 2022-05-17 NOTE — Plan of Care (Signed)
  Problem: Education: Goal: Knowledge of General Education information will improve Description: Including pain rating scale, medication(s)/side effects and non-pharmacologic comfort measures Outcome: Progressing   Problem: Health Behavior/Discharge Planning: Goal: Ability to manage health-related needs will improve Outcome: Progressing   Problem: Clinical Measurements: Goal: Ability to maintain clinical measurements within normal limits will improve Outcome: Progressing Goal: Will remain free from infection Outcome: Progressing Goal: Diagnostic test results will improve Outcome: Progressing Goal: Respiratory complications will improve Outcome: Progressing Goal: Cardiovascular complication will be avoided Outcome: Progressing   Problem: Activity: Goal: Risk for activity intolerance will decrease Outcome: Progressing   Problem: Nutrition: Goal: Adequate nutrition will be maintained Outcome: Progressing   Problem: Coping: Goal: Level of anxiety will decrease Outcome: Progressing   Problem: Elimination: Goal: Will not experience complications related to bowel motility Outcome: Progressing Goal: Will not experience complications related to urinary retention Outcome: Progressing   Problem: Pain Managment: Goal: General experience of comfort will improve Outcome: Progressing   Problem: Safety: Goal: Ability to remain free from injury will improve Outcome: Progressing   Problem: Skin Integrity: Goal: Risk for impaired skin integrity will decrease Outcome: Progressing   Problem: Education: Goal: Knowledge of the prescribed therapeutic regimen will improve Outcome: Progressing   Problem: Bowel/Gastric: Goal: Gastrointestinal status for postoperative course will improve Outcome: Progressing   Problem: Nutritional: Goal: Ability to achieve adequate nutritional intake will improve Outcome: Progressing   Problem: Clinical Measurements: Goal: Postoperative  complications will be avoided or minimized Outcome: Progressing   Problem: Respiratory: Goal: Ability to maintain a clear airway will improve Outcome: Progressing   Problem: Skin Integrity: Goal: Demonstration of wound healing without infection will improve Outcome: Progressing

## 2022-05-17 NOTE — Progress Notes (Addendum)
      [address]       [place], [address]             [phone]       8 Days Post-Op Procedure(s) (LRB): LAPAROSCOPIC JEJUNOSTOMY (N/A) ESOPHAGOGASTRODUODENOSCOPY (EGD) (N/A) VIDEO ASSISTED THORACOSCOPY (Right)  Subjective: Patient has been up most of the night  Objective: Vital signs in last 24 hours: Temp:  [97.6 F (36.4 C)-99.4 F (37.4 C)] 97.6 F (36.4 C) (11/08 0348) Pulse Rate:  [75-90] 89 (11/08 0348) Cardiac Rhythm: Normal sinus rhythm (11/07 1918) Resp:  [16-23] 18 (11/08 0348) BP: (150-178)/(76-98) 156/86 (11/08 0348) SpO2:  [93 %-97 %] 94 % (11/08 0348)     Intake/Output from previous day: 11/07 0701 - 11/08 0700 In: 1990.9 [NG/GT:1838.7; IV Piggyback:152.3] Out: 3600 [Urine:3600]   Physical Exam:  Cardiovascular: RRR Pulmonary: Clear to auscultation bilaterally Abdomen: Soft, non tender, bowel sounds present. Extremities: Trace bilateral lower extremity edema. Wounds: Clean and dry.  No erythema or signs of infection. Minor bloody like drainage around J tube fixator Drains: Serous output from right chest tube drain  Lab Results: CBC: Recent Labs    05/17/22 0335  WBC 5.4  HGB 9.7*  HCT 30.6*  PLT 201    BMET:  Recent Labs    05/15/22 0800  NA 142  K 4.3  CL 105  CO2 29  GLUCOSE 114*  BUN 19  CREATININE 1.05  CALCIUM 12.1*     PT/INR: No results for input(s): "LABPROT", "INR" in the last 72 hours. ABG:  INR: Will add last result for INR, ABG once components are confirmed Will add last 4 CBG results once components are confirmed  Assessment/Plan:  1. CV - First degree heart block. SBP in the 150's. On Clonidine patch 0.2 mg Hydralazine 10 mg IV tid. Will increase Hydralazine. Once able to take liquids, will resume Amlodipine 10 mg daily, Lisinopril 40 mg daily. 2.  Pulmonary - On room air (oxygen in nose but not on). As discussed with nursing staff, he will not need oxygen at discharge as does not meet  criteria.  Right JP drain with serous output and minimal output as not recorded. CXR this am appears stable.  Encourage incentive spirometer 3. Anemia- H and H this am up to 9.7 and 30.6 4. GI-NPO. CBGs 137/127/135. Tube feedings at 70 ml/hr.  5. ID-on Zosyn (possible aspiration PNA) and Diflucan. Will discuss with [name] length of Zosyn (day 9) 6. Deconditioned-continue PT. Home PT and OT arranged 7. On Lovenox for DVT  Donielle M ZimmermanPA-C 05/17/2022,6:58 AM    Agree with above Continue abx and antifungals for esophageal leak Will diurese today Titrating bp meds [place]

## 2022-05-18 ENCOUNTER — Ambulatory Visit: Payer: No Typology Code available for payment source | Admitting: Hematology and Oncology

## 2022-05-18 ENCOUNTER — Other Ambulatory Visit: Payer: No Typology Code available for payment source

## 2022-05-18 LAB — GLUCOSE, CAPILLARY
Glucose-Capillary: 139 mg/dL — ABNORMAL HIGH (ref 70–99)
Glucose-Capillary: 140 mg/dL — ABNORMAL HIGH (ref 70–99)
Glucose-Capillary: 154 mg/dL — ABNORMAL HIGH (ref 70–99)
Glucose-Capillary: 104 mg/dL — ABNORMAL HIGH (ref 70–99)
Glucose-Capillary: 163 mg/dL — ABNORMAL HIGH (ref 70–99)
Glucose-Capillary: 125 mg/dL — ABNORMAL HIGH (ref 70–99)

## 2022-05-18 MED ORDER — OSMOLITE 1.5 CAL PO LIQD: 1440.0000 mL | ORAL | Status: DC

## 2022-05-18 MED ORDER — OSMOLITE 1.5 CAL PO LIQD: 1000.0000 mL | ORAL | Status: DC

## 2022-05-18 MED ORDER — OSMOLITE 1.5 CAL PO LIQD
1440.0000 mL | ORAL | Status: DC
  Administered 2022-05-18: 1000 mL

## 2022-05-18 MED ORDER — SODIUM CHLORIDE 0.9 % IV SOLN
12.5000 mg | Freq: Three times a day (TID) | INTRAVENOUS | Status: DC | PRN
  Administered 2022-05-21 – 2022-05-27 (×3): 12.5 mg via INTRAVENOUS
  Filled 2022-05-18 (×4): qty 0.5

## 2022-05-18 MED ORDER — OSMOLITE 1.5 CAL PO LIQD
1000.0000 mL | ORAL | Status: AC
  Administered 2022-05-18: 1000 mL

## 2022-05-18 NOTE — Progress Notes (Signed)
Brief Nutrition Follow-up:   Interventions:  1) Pt and wife request to remain on Osmolite 1.5. TOC and MD are aware. [place] has some cases of Osmolite 1.5 they can to provide to pt at discharge if this is needed until [place] can provide formula. RD spoke with Dietitian at [facility] and formula will be available next week at [facility] for pick up. Wife/pt need to call BEFORE coming to pick up tube feeding formula. They should call [facility] at [phone] and Virgilio Belling will bring formula to front desk for pick up.   2) Pt and wife agreeable to trial of 18 hours feedings. Pt only request is to go slowly. Today plan is for 80 ml/hr starting at 4pm with orders to infuse for 18 hours only. If tolerating, plan to titrate again tomorrow. Goal is 95 ml/hr x 18 hours. Plan discussed with TOC, RN and MD  [name] MS, RDN, LDN, CNSC Registered Dietitian 3 Clinical Nutrition RD Pager and On-Call Pager Number Located in [place]

## 2022-05-18 NOTE — Progress Notes (Signed)
Occupational Therapy Treatment Patient Details Name: Keith Moreno MRN: [id number] DOB: Jun 12, 1961 Today's Date: 05/18/2022   History of present illness Pt is 61 y/o male [facility]05/[facility] from cancer center with weakness, dizziness, difficulty swallowing, admitted for hypercalcemia. S/p esophagogastroscopy, VATS 10/31. Of note, pt with recent prolonged hospitalization (9/27-10/20) after esophagectomy with placement of J-tube. Other PMH includes esophageal CA, HTN, sacral wound.   OT comments  Pt receptive to education in energy conservation strategies and techniques to reduce B UE tremor during ADLs stabilizing UEs on table, arm of chair. Pt reports his wife sponge bathes and completes LB dressing for him and this works out well for them. Assisted with LB bathing and dressing after primofit failed when pt stood. Pt completed seated grooming with min assist. Pt reports he has requested a tub seat from the [place].    Recommendations for follow up therapy are one component of a multi-disciplinary discharge planning process, led by the attending physician.  Recommendations may be updated based on patient status, additional functional criteria and insurance authorization.    Follow Up Recommendations  Home health OT    Assistance Recommended at Discharge Intermittent Supervision/Assistance  Patient can return home with the following  A little help with walking and/or transfers;Assistance with cooking/housework;Assist for transportation;Help with stairs or ramp for entrance;A lot of help with bathing/dressing/bathroom   Equipment Recommendations  None recommended by OT    Recommendations for Other Services      Precautions / Restrictions Precautions Precautions: Fall;Other (comment) Precaution Comments: JP drain; J-tube Restrictions Weight Bearing Restrictions: No       Mobility Bed Mobility               General bed mobility comments: in chair    Transfers Overall transfer  level: Needs assistance Equipment used: Rolling walker (2 wheels) Transfers: Sit to/from Stand Sit to Stand: Modified independent (Device/Increase time), From elevated surface                 Balance Overall balance assessment: Needs assistance   Sitting balance-Leahy Scale: Good     Standing balance support: Bilateral upper extremity supported Standing balance-Leahy Scale: Fair Standing balance comment: stood with support of RW                           ADL either performed or assessed with clinical judgement   ADL Overall ADL's : Needs assistance/impaired Eating/Feeding: Set up;Sitting Eating/Feeding Details (indicate cue type and reason): ice chips with spoon, difficulty due to tremor Grooming: Oral care;Sitting;Minimal assistance Grooming Details (indicate cue type and reason): tremor interfering, educated in option of stabilizing elbow     Lower Body Bathing: Maximal assistance;Sit to/from stand       Lower Body Dressing: Total assistance                 General ADL Comments: pt urinated in standing with primofit leakage, assisted to wash LEs and change socks    Extremity/Trunk Assessment Upper Extremity Assessment Upper Extremity Assessment: RUE deficits/detail;LUE deficits/detail RUE Deficits / Details: significant tremor, pt with history of surgery and hardware in his shoulder with longstanding limitations RUE Coordination: decreased fine motor;decreased gross motor LUE Deficits / Details: significant tremor LUE Coordination: decreased fine motor;decreased gross motor            Vision       Perception     Praxis      Cognition Arousal/Alertness: Awake/alert Behavior During Therapy:  Flat affect Overall Cognitive Status: Impaired/Different from baseline Area of Impairment: Problem solving                       Following Commands: Follows one step commands with increased time     Problem Solving: Slow  processing General Comments: pt receptive to energy conservation strategies, reports his wife is upset the MD has allowed pt to have small amounts of ice chips he suctions out of his mouth after they melt        Exercises      Shoulder Instructions       General Comments      Pertinent Vitals/ Pain       Pain Assessment Pain Assessment: Faces Faces Pain Scale: Hurts little more Pain Location: back Pain Descriptors / Indicators: Grimacing, Guarding, Discomfort Pain Intervention(s): Repositioned  Home Living                                          Prior Functioning/Environment              Frequency  Min 2X/week        Progress Toward Goals  OT Goals(current goals can now be found in the care plan section)  Progress towards OT goals: Progressing toward goals  Acute Rehab OT Goals OT Goal Formulation: With patient Time For Goal Achievement: 05/25/22 Potential to Achieve Goals: Good  Plan Discharge plan remains appropriate    Co-evaluation                 AM-PAC OT "6 Clicks" Daily Activity     Outcome Measure   Help from another person eating meals?: None Help from another person taking care of personal grooming?: A Little Help from another person toileting, which includes using toliet, bedpan, or urinal?: A Little Help from another person bathing (including washing, rinsing, drying)?: A Lot Help from another person to put on and taking off regular upper body clothing?: A Little Help from another person to put on and taking off regular lower body clothing?: Total 6 Click Score: 16    End of Session Equipment Utilized During Treatment: Rolling walker (2 wheels)  OT Visit Diagnosis: Unsteadiness on feet (R26.81);Muscle weakness (generalized) (M62.81)   Activity Tolerance Patient tolerated treatment well   Patient Left in chair;with call bell/phone within reach;with family/visitor present   Nurse Communication Other (comment)  (NT changed primofit and emptied cannister)        Time: 3267-1245 OT Time Calculation (min): 25 min  Charges: OT General Charges $OT Visit: 1 Visit OT Treatments $Self Care/Home Management : 23-37 mins  Cleta Alberts, OTR/L Acute Rehabilitation Services Office: [phone]   [name] 05/18/2022, 2:24 PM

## 2022-05-18 NOTE — Plan of Care (Signed)
  Problem: Education: Goal: Knowledge of General Education information will improve Description: Including pain rating scale, medication(s)/side effects and non-pharmacologic comfort measures Outcome: Progressing   Problem: Health Behavior/Discharge Planning: Goal: Ability to manage health-related needs will improve Outcome: Progressing   Problem: Clinical Measurements: Goal: Ability to maintain clinical measurements within normal limits will improve Outcome: Progressing Goal: Will remain free from infection Outcome: Progressing Goal: Diagnostic test results will improve Outcome: Progressing Goal: Respiratory complications will improve Outcome: Progressing Goal: Cardiovascular complication will be avoided Outcome: Progressing   Problem: Activity: Goal: Risk for activity intolerance will decrease Outcome: Progressing   Problem: Nutrition: Goal: Adequate nutrition will be maintained Outcome: Progressing   Problem: Coping: Goal: Level of anxiety will decrease Outcome: Progressing   Problem: Elimination: Goal: Will not experience complications related to bowel motility Outcome: Progressing Goal: Will not experience complications related to urinary retention Outcome: Progressing   Problem: Pain Managment: Goal: General experience of comfort will improve Outcome: Progressing   Problem: Safety: Goal: Ability to remain free from injury will improve Outcome: Progressing   Problem: Skin Integrity: Goal: Risk for impaired skin integrity will decrease Outcome: Progressing   Problem: Education: Goal: Knowledge of the prescribed therapeutic regimen will improve Outcome: Progressing   Problem: Bowel/Gastric: Goal: Gastrointestinal status for postoperative course will improve Outcome: Progressing   Problem: Nutritional: Goal: Ability to achieve adequate nutritional intake will improve Outcome: Progressing   Problem: Clinical Measurements: Goal: Postoperative  complications will be avoided or minimized Outcome: Progressing   Problem: Respiratory: Goal: Ability to maintain a clear airway will improve Outcome: Progressing   Problem: Skin Integrity: Goal: Demonstration of wound healing without infection will improve Outcome: Progressing

## 2022-05-18 NOTE — TOC Progression Note (Addendum)
Transition of Care [facility]) - Progression Note    Patient Details  Name: Keith Moreno MRN: [id number] Date of Birth: 02-16-1961  Transition of Care [facility]) CM/SW Contact  Jacalyn Lefevre Edson Snowball, RN Phone Number: 05/18/2022, 11:29 AM  Clinical Narrative:     Spoke to PA, RD and patient and wife regarding tube feeding formula at home. Patient wants to stay on Osmolite 1.5 . NCM called Vinnie Level with [facility] [phone] ext [phone] and left message. Awaiting call back.   Per Heloise Beecham RD , RD at [facility] has several cases of Osmolite 1.5 she can donate to them   Patient and wife have not heard from [place] regarding 3 in 1 and hospital bed .  Spoke to [place] worker [phone] ext [phone] at the [employer] . They have received the order for bariatric bedside commode on 05/08/22 , it takes 5 to 7 business days for delivery, they received order for hospital bed on 05/10/22 and it takes 7 to 10 business days for delivery.  [facility] is closed [facility]19[facility]22 for [facility]   Patient on oxygen. NCM asked for pulse oximetry study 05/16/22 if home oxygen needed will need order and fax to [facility]   Expected Discharge Plan: [place] Barriers to Discharge: Continued Medical Work up  Expected Discharge Plan and Services Expected Discharge Plan: [place]   Discharge Planning Services: CM Consult Post Acute Care Choice: [place] arrangements for the past 2 months: Single Family Home                 DME Arranged:  (see note)         HH Arranged: RN, PT, OT HH Agency: [place] Date [facility] Agency Contacted: 05/08/22 Time [place]: 33 Representative spoke with at [place]: cory   Social Determinants of Health (SDOH) Interventions    Readmission Risk Interventions     No data to display

## 2022-05-18 NOTE — Progress Notes (Addendum)
      [address]       [place] [address]             [phone]       9 Days Post-Op Procedure(s) (LRB): LAPAROSCOPIC JEJUNOSTOMY (N/A) ESOPHAGOGASTRODUODENOSCOPY (EGD) (N/A) VIDEO ASSISTED THORACOSCOPY (Right)  Subjective: Patient slept much better last evening  Objective: Vital signs in last 24 hours: Temp:  [97.6 F (36.4 C)-98.6 F (37 C)] 98.6 F (37 C) (11/09 0346) Pulse Rate:  [87-96] 87 (11/09 0346) Cardiac Rhythm: Normal sinus rhythm (11/08 1935) Resp:  [14-21] 14 (11/09 0346) BP: (117-156)/(66-103) 117/66 (11/09 0346) SpO2:  [95 %-96 %] 96 % (11/09 0346)     Intake/Output from previous day: 11/08 0701 - 11/09 0700 In: 2465.1 [NG/GT:2157.2; IV Piggyback:247.9] Out: 2375 [Urine:2375]   Physical Exam:  Cardiovascular: RRR Pulmonary: Clear to auscultation bilaterally Abdomen: Soft, non tender, bowel sounds present. Extremities: Trace bilateral lower extremity edema. Wounds: Clean and dry.  No erythema or signs of infection. Minor bloody like drainage around J tube fixator Drains: Serous output from right chest tube drain  Lab Results: CBC: Recent Labs    05/17/22 0335  WBC 5.4  HGB 9.7*  HCT 30.6*  PLT 201    BMET:  Recent Labs    05/15/22 0800  NA 142  K 4.3  CL 105  CO2 29  GLUCOSE 114*  BUN 19  CREATININE 1.05  CALCIUM 12.1*     PT/INR: No results for input(s): "LABPROT", "INR" in the last 72 hours. ABG:  INR: Will add last result for INR, ABG once components are confirmed Will add last 4 CBG results once components are confirmed  Assessment/Plan:  1. CV - First degree heart block. SBP improved with increase in IV Hydralazine. On Clonidine patch 0.2 mg Hydralazine 20 mg IV tid. Once able to take liquids, will resume Amlodipine 10 mg daily, Lisinopril 40 mg daily. 2.  Pulmonary - On room air (oxygen in nose but not on). Right JP drain with serous output and minimal output as not recorded. CXR this am appears  stable.  Encourage incentive spirometer 3. Anemia- Last H and H  9.7 and 30.6 4. GI-NPO. CBGs 163/139/140. Tube feedings at 70 ml/hr. Per nutrition notes, on Osmolite, ? switch to Jevity as patient has at home.  5. ID-on Zosyn (possible aspiration PNA) and Diflucan. Continue antibiotics as had esophageal leak on this admission 6. Deconditioned-continue PT. Home PT and OT arranged 7. On Lovenox for DVT 8. Per [name], give IV Lasix again today  Sharalyn Ink [facility] 05/18/2022,7:23 AM  Agree with above Ok to transition tube feeds  Pre-albumin on Monday Ok to have 1 cup of ice/day  Lajuana Matte

## 2022-05-18 NOTE — Progress Notes (Signed)
Nutrition Brief Note  Received page forwarded from on-call RD to assist RN with updating TF order for patient as RN unable to scan TF for current order. Plan is for patient to receive Osmolite 1.5 Cal at 80 mL/hour x 18 hours starting at 1600. Order was changed from 24 hour to continuous and dose was changed from 1000 mL to 1440 mL, so RN was unable to scan TF for this order.  Tube feeds were started at 1600 by RN per original order. As it is after 1600, placed one order for today (start time defaulted to 1800) for 80 mL/hour x 18 hours, but RN aware this was actually started at 1600 and should end at 1000 tomorrow 11/10. Placed a separate order starting on 11/10 at 1400 for 80 mL/hour x 18 hours.  Loanne Drilling, MS, RD, LDN, CNSC Pager number available on [place]

## 2022-05-18 NOTE — Progress Notes (Signed)
Physical Therapy Treatment Patient Details Name: Keith Moreno MRN: [id number] DOB: [birth year] Today's Date: 05/18/2022   History of Present Illness Pt is 61 y/o male admitted [facility]/05/[facility] from [facility] center with weakness, dizziness, difficulty swallowing, admitted for hypercalcemia. S/p esophagogastroscopy, VATS 10/31. Of note, pt with recent prolonged hospitalization (9/27-10/20) after esophagectomy with placement of J-tube. Other PMH includes esophageal CA, HTN, sacral wound.    PT Comments    Pt steady progressing with mobility. Today's session focused on ambulation endurance while monitoring vitals. Pt ambulated 139ftx2 supervision with rollator, experienced increased fatigue during session with mild SOB impacting ability to progress ambulation further. Mood appeared altered today during session, unsure if prolonged hospitalization affecting pt's emotional status. Pt remains limited by generalized weakness, decreased activity tolerance, and impaired balance strategies/postural reactions. Continue to recommend acute PT services to maximize functional mobility and independence prior to d/c with HHPT.  HR up to high 120s during mobility RR low 30s during ambulation   Recommendations for follow up therapy are one component of a multi-disciplinary discharge planning process, led by the attending physician.  Recommendations may be updated based on patient status, additional functional criteria and insurance authorization.  Follow Up Recommendations  Home health PT     Assistance Recommended at Discharge Intermittent Supervision/Assistance  Patient can return home with the following A little help with bathing/dressing/bathroom;Assistance with cooking/housework;Assist for transportation;Help with stairs or ramp for entrance   Equipment Recommendations  None recommended by PT    Recommendations for Other Services       Precautions / Restrictions Precautions Precautions: Fall;Other  (comment) Precaution Comments: JP drain; J-tube Restrictions Weight Bearing Restrictions: No     Mobility  Bed Mobility Overal bed mobility: Needs Assistance Bed Mobility: Supine to Sit     Supine to sit: HOB elevated, Modified independent (Device/Increase time)     General bed mobility comments: mod ind to move from supine to EOB with occassional cue for line negotiation    Transfers Overall transfer level: Needs assistance Equipment used: Rollator (4 wheels) Transfers: Sit to/from Stand Sit to Stand: Modified independent (Device/Increase time), From elevated surface           General transfer comment: mod ind to stand from bed and rollator seat, pt very aware of line placement and particular of how equipment is arranged    Ambulation/Gait Ambulation/Gait assistance: Supervision Gait Distance (Feet): 200 Feet Assistive device: Rollator (4 wheels) Gait Pattern/deviations: Step-through pattern, Decreased stride length Gait velocity: Decreased     General Gait Details: slow cautious gait requiring supervision for line mgmt, increased fatigue during session today with challenge to maintain conversation while keeping steady RR   Stairs             Wheelchair Mobility    Modified Rankin (Stroke Patients Only)       Balance Overall balance assessment: Needs assistance Sitting-balance support: Feet supported, No upper extremity supported Sitting balance-Leahy Scale: Good Sitting balance - Comments: sitting EOB without external support and no LOB   Standing balance support: Single extremity supported, Bilateral upper extremity supported, During functional activity, No upper extremity supported Standing balance-Leahy Scale: Fair Standing balance comment: demonstrated static standing without UE support with no LOB when transitioning to toilet upon completion of ambulation                            Cognition Arousal/Alertness:  Awake/alert Behavior During Therapy: Flat affect Overall Cognitive Status: Impaired/Different from baseline  Area of Impairment: Safety/judgement, Problem solving, Following commands                       Following Commands: Follows one step commands with increased time, Follows multi-step commands inconsistently Safety/Judgement: Decreased awareness of deficits   Problem Solving: Requires verbal cues, Requires tactile cues, Slow processing, Decreased initiation General Comments: pt with apparent change in mood this session, continues with flat affect and less wanting to converse in conversation. pt reports excess tiredness and fatigue, more so than previous days        Exercises      General Comments General comments (skin integrity, edema, etc.): pt with increase in RR during ambulation today, continues to required seated rest breaks due to SOB and fatigue. HR up to high 120s bpm during mobility      Pertinent Vitals/Pain Pain Assessment Faces Pain Scale: Hurts a little bit Pain Location: legs Pain Descriptors / Indicators: Tiring Pain Intervention(s): Monitored during session    Home Living                          Prior Function            PT Goals (current goals can now be found in the care plan section) Acute Rehab PT Goals Patient Stated Goal: get back home PT Goal Formulation: With patient Time For Goal Achievement: 05/24/22 Potential to Achieve Goals: Good Progress towards PT goals: Progressing toward goals    Frequency    Min 3X/week      PT Plan Current plan remains appropriate    Co-evaluation              AM-PAC PT "6 Clicks" Mobility   Outcome Measure  Help needed turning from your back to your side while in a flat bed without using bedrails?: A Little Help needed moving from lying on your back to sitting on the side of a flat bed without using bedrails?: A Little Help needed moving to and from a bed to a chair  (including a wheelchair)?: A Little Help needed standing up from a chair using your arms (e.g., wheelchair or bedside chair)?: None Help needed to walk in hospital room?: A Little Help needed climbing 3-5 steps with a railing? : A Little 6 Click Score: 19    End of Session Equipment Utilized During Treatment: Gait belt;Oxygen Activity Tolerance: Patient tolerated treatment well;Patient limited by fatigue Patient left: with family/visitor present;Other (comment) (on commode) Nurse Communication: Mobility status;Other (comment) (sitting on commode at end of session) PT Visit Diagnosis: Other abnormalities of gait and mobility (R26.89);Difficulty in walking, not elsewhere classified (R26.2);Muscle weakness (generalized) (M62.81)     Time: 1610-9604 PT Time Calculation (min) (ACUTE ONLY): 28 min  Charges:  $Therapeutic Exercise: 8-22 mins $Therapeutic Activity: 8-22 mins                     Alric Ran, SPT    Keith Moreno 05/18/2022, 10:51 AM

## 2022-05-19 LAB — GLUCOSE, CAPILLARY
Glucose-Capillary: 106 mg/dL — ABNORMAL HIGH (ref 70–99)
Glucose-Capillary: 139 mg/dL — ABNORMAL HIGH (ref 70–99)
Glucose-Capillary: 142 mg/dL — ABNORMAL HIGH (ref 70–99)
Glucose-Capillary: 155 mg/dL — ABNORMAL HIGH (ref 70–99)
Glucose-Capillary: 167 mg/dL — ABNORMAL HIGH (ref 70–99)
Glucose-Capillary: 182 mg/dL — ABNORMAL HIGH (ref 70–99)

## 2022-05-19 LAB — BASIC METABOLIC PANEL
Anion gap: 6 (ref 5–15)
BUN: 45 mg/dL — ABNORMAL HIGH (ref 8–23)
CO2: 32 mmol/L (ref 22–32)
Calcium: 13.5 mg/dL (ref 8.9–10.3)
Chloride: 104 mmol/L (ref 98–111)
Creatinine, Ser: 2 mg/dL — ABNORMAL HIGH (ref 0.61–1.24)
GFR, Estimated: 37 mL/min — ABNORMAL LOW (ref >60)
Glucose, Bld: 161 mg/dL — ABNORMAL HIGH (ref 70–99)
Potassium: 3.9 mmol/L (ref 3.5–5.1)
Sodium: 142 mmol/L (ref 135–145)

## 2022-05-19 MED ORDER — FREE WATER
150.0000 mL | Status: DC
  Administered 2022-05-19 – 2022-05-23 (×16): 150 mL

## 2022-05-19 MED ORDER — OSMOLITE 1.5 CAL PO LIQD
1620.0000 mL | ORAL | Status: DC
  Administered 2022-05-19 – 2022-05-22 (×4): 1620 mL
  Filled 2022-05-19: qty 1659

## 2022-05-19 MED ORDER — FUROSEMIDE 10 MG/ML IJ SOLN
40.0000 mg | Freq: Every day | INTRAMUSCULAR | Status: AC
  Administered 2022-05-19: 40 mg via INTRAVENOUS

## 2022-05-19 NOTE — Progress Notes (Signed)
Nutrition Follow-up  DOCUMENTATION CODES:   Non-severe (moderate) malnutrition in context of chronic illness  INTERVENTION:   Tube Feeding via J-tube: Increase Osmolite 1.5 to 90 ml/hr over 18 hours (4pm to 10am daily) Goal: Osmolite 1.5 at 95 ml/hr over 18 hours Goal regimen provides 2565 kcals, 107 g of protein and 1300 mL of free water  Pro-Source TF20 60 mL BID, each packet provides 20 g of protein and 80 kcals Pro-Source TF currently ordered for additional protein. but can be discontinued at discharge.   Increase free water flush to 150 mL q 4 hours. Total free water from TF and free water flush: 2200 mL  Re-Weight Patient; no new weight since 10/31   NUTRITION DIAGNOSIS:   Moderate Malnutrition related to chronic illness (esophageal cancer) as evidenced by mild fat depletion, moderate muscle depletion, percent weight loss (8.9% weight loss in less than 5 months).  Being addressed via TF   GOAL:   Patient will meet greater than or equal to 90% of their needs  Progressing  MONITOR:   Labs, Weight trends, TF tolerance, Skin, I & O's  REASON FOR ASSESSMENT:   Consult Enteral/tube feeding initiation and management  ASSESSMENT:   61 year old male who presented on 10/27 as a direct admission from the [facility] with poor oral intake, dysphagia with fluids, hypercalcemia. PMH of esophageal adenocarcinoma s/p Ivor Lewis esophagectomy and J-tube placement on 04/05/22 (later removed prior to discharge), HTN, pre-DM.  Pt has been ambulating; noted PT recommending home health PT  Wife not present on visit today  Tolerated Vital/Peptamen 1.5 at 80 ml/hr over 18 hours via J-tube. Plan to increase by 90 mL/hr today as tolerated 80 ml/hr and pt ok with increasing by 10 mL q 24 hours   Current free water flush of 100 mL q 6 hours. Plan to increase free water flushes to meet hydration needs as NPO and no MIVF infusing   No weight since 10/31. Need new weight  Labs:  calcium 13.5 (H), sodium 142, Creatinine 2.0, BUN 45. Noted iPTH wdl on admission Meds: reviewed  Diet Order:   Diet Order             Diet NPO time specified  Diet effective now                   EDUCATION NEEDS:   Education needs have been addressed  Skin:  Skin Assessment: Skin Integrity Issues: Skin Integrity Issues:: Stage II, Incisions Stage II: L buttocks Incisions: abdomen, R chest  Last BM:  05/05/22  Height:   Ht Readings from Last 1 Encounters:  05/11/22 6' (1.829 m)    Weight:   Wt Readings from Last 1 Encounters:  05/09/22 (!) 144.6 kg    Ideal Body Weight:  80.9 kg  BMI:  Body mass index is 43.24 kg/m.  Estimated Nutritional Needs:   Kcal:  2500-2700  Protein:  125-145 grams  Fluid:  >2.2 L   [name] MS, RDN, LDN, CNSC Registered Dietitian 3 Clinical Nutrition RD Pager and On-Call Pager Number Located in [place]

## 2022-05-19 NOTE — Progress Notes (Signed)
Mobility Specialist Progress Note    05/19/22 1043  Mobility  Activity Ambulated with assistance in hallway  Level of Assistance Minimal assist, patient does 75% or more  Assistive Device Four wheel walker  Distance Ambulated (ft) 140 ft (60+80)  Activity Response Tolerated fair  Mobility Referral Yes  $Mobility charge 1 Mobility   Pre-Mobility: 97 HR, 126/91 (102) BP, 96% SpO2 During Mobility: 133 HR Post-Mobility: 119 HR, 98% SpO2  Pt received in bed and agreeable. C/o fatigue and upper body shaking. Pt requesting to have O2 increased to 3LO2 for activity. Took x1 seated rest break. Returned to chair. Left with NT present.  Hildred Alamin Mobility Specialist  Please Psychologist, sport and exercise or Rehab Office at [phone]

## 2022-05-19 NOTE — Plan of Care (Signed)
  Problem: Education: Goal: Knowledge of General Education information will improve Description: Including pain rating scale, medication(s)/side effects and non-pharmacologic comfort measures Outcome: Progressing   Problem: Health Behavior/Discharge Planning: Goal: Ability to manage health-related needs will improve Outcome: Progressing   Problem: Clinical Measurements: Goal: Ability to maintain clinical measurements within normal limits will improve Outcome: Progressing Goal: Will remain free from infection Outcome: Progressing Goal: Diagnostic test results will improve Outcome: Progressing Goal: Respiratory complications will improve Outcome: Progressing Goal: Cardiovascular complication will be avoided Outcome: Progressing   Problem: Activity: Goal: Risk for activity intolerance will decrease Outcome: Progressing   Problem: Nutrition: Goal: Adequate nutrition will be maintained Outcome: Progressing   Problem: Coping: Goal: Level of anxiety will decrease Outcome: Progressing   Problem: Elimination: Goal: Will not experience complications related to bowel motility Outcome: Progressing Goal: Will not experience complications related to urinary retention Outcome: Progressing   Problem: Pain Managment: Goal: General experience of comfort will improve Outcome: Progressing   Problem: Safety: Goal: Ability to remain free from injury will improve Outcome: Progressing   Problem: Skin Integrity: Goal: Risk for impaired skin integrity will decrease Outcome: Progressing   Problem: Education: Goal: Knowledge of the prescribed therapeutic regimen will improve Outcome: Progressing   Problem: Bowel/Gastric: Goal: Gastrointestinal status for postoperative course will improve Outcome: Progressing   Problem: Nutritional: Goal: Ability to achieve adequate nutritional intake will improve Outcome: Progressing   Problem: Clinical Measurements: Goal: Postoperative  complications will be avoided or minimized Outcome: Progressing   Problem: Respiratory: Goal: Ability to maintain a clear airway will improve Outcome: Progressing   Problem: Skin Integrity: Goal: Demonstration of wound healing without infection will improve Outcome: Progressing

## 2022-05-19 NOTE — Progress Notes (Addendum)
      [address]       [place] [address]             [phone]       10 Days Post-Op Procedure(s) (LRB): LAPAROSCOPIC JEJUNOSTOMY (N/A) ESOPHAGOGASTRODUODENOSCOPY (EGD) (N/A) VIDEO ASSISTED THORACOSCOPY (Right)  Subjective: Patient used bathroom this am. He slept for a few hours last night.  Objective: Vital signs in last 24 hours: Temp:  [97.6 F (36.4 C)-98.2 F (36.8 C)] 97.8 F (36.6 C) (11/10 0752) Pulse Rate:  [72-93] 93 (11/10 0752) Cardiac Rhythm: Normal sinus rhythm (11/09 1932) Resp:  [13-20] 18 (11/10 0752) BP: (118-148)/(64-81) 148/81 (11/10 0752) SpO2:  [97 %-99 %] 97 % (11/10 0752)     Intake/Output from previous day: 11/09 0701 - 11/10 0700 In: 1783.9 [NG/GT:1539.7; IV Piggyback:244.2] Out: 2400 [Urine:2400]   Physical Exam:  Cardiovascular: RRR Pulmonary: Clear to auscultation bilaterally Abdomen: Soft, non tender, bowel sounds present. Extremities: Trace bilateral lower extremity edema. Wounds: Clean and dry.  No erythema or signs of infection. Minor dried bloody like drainage around J tube fixator Drains: Serous output from right chest tube drain  Lab Results: CBC: Recent Labs    05/17/22 0335  WBC 5.4  HGB 9.7*  HCT 30.6*  PLT 201    BMET:  Recent Labs    05/19/22 0350  NA 142  K 3.9  CL 104  CO2 32  GLUCOSE 161*  BUN 45*  CREATININE 2.00*  CALCIUM 13.5*     PT/INR: No results for input(s): "LABPROT", "INR" in the last 72 hours. ABG:  INR: Will add last result for INR, ABG once components are confirmed Will add last 4 CBG results once components are confirmed  Assessment/Plan:  1. CV - First degree heart block. On Clonidine patch 0.1 mg Hydralazine 20 mg IV tid. Once able to take liquids, will resume Amlodipine 10 mg daily, Lisinopril 40 mg daily. 2.  Pulmonary - On room air (oxygen in nose but not on). Right JP drain with serous output and minimal output as not recorded.  Encourage incentive  spirometer 3. Anemia- Last H and H  9.7 and 30.6 4. GI-NPO. CBGs 163/139/140. Tube feedings at 70 ml/hr. Per nutrition notes, on Osmolite, ? switch to Jevity as patient has at home.  5. ID-on Zosyn (possible aspiration PNA) and Diflucan. Continue antibiotics as had esophageal leak on this admission 6. Deconditioned-continue PT. Home PT and OT arranged 7. On Lovenox for DVT 8. Per [name], give IV Lasix again today and then will stop  Donielle M ZimmermanPA-C 05/19/2022,8:12 AM   Agree with above. Continue tube feeds. Continue IV antibiotics. We will check nutritional labs tomorrow.  Lanah Steines Bary Leriche

## 2022-05-20 LAB — GLUCOSE, CAPILLARY
Glucose-Capillary: 145 mg/dL — ABNORMAL HIGH (ref 70–99)
Glucose-Capillary: 159 mg/dL — ABNORMAL HIGH (ref 70–99)
Glucose-Capillary: 90 mg/dL (ref 70–99)
Glucose-Capillary: 119 mg/dL — ABNORMAL HIGH (ref 70–99)
Glucose-Capillary: 172 mg/dL — ABNORMAL HIGH (ref 70–99)

## 2022-05-20 MED ORDER — CALCITONIN (SALMON) 200 UNIT/ML IJ SOLN
400.0000 [IU] | Freq: Two times a day (BID) | INTRAMUSCULAR | Status: DC
  Administered 2022-05-20 – 2022-05-21 (×3): 400 [IU] via SUBCUTANEOUS
  Filled 2022-05-20 (×4): qty 2

## 2022-05-20 MED ORDER — ZOLEDRONIC ACID 4 MG/5ML IV CONC
4.0000 mg | Freq: Once | INTRAVENOUS | Status: AC
  Administered 2022-05-20: 4 mg via INTRAVENOUS
  Filled 2022-05-20: qty 5

## 2022-05-20 MED ORDER — SODIUM CHLORIDE 0.9 % IV SOLN: INTRAVENOUS | Status: DC

## 2022-05-20 NOTE — Progress Notes (Addendum)
      [address]       [place] [address]             [phone]       11 Days Post-Op Procedure(s) (LRB): LAPAROSCOPIC JEJUNOSTOMY (N/A) ESOPHAGOGASTRODUODENOSCOPY (EGD) (N/A) VIDEO ASSISTED THORACOSCOPY (Right)  Subjective:  Patient complaints of his tremor being worse.  He has ambulated, fatigues more easily.  Some loose stools.  Objective: Vital signs in last 24 hours: Temp:  [97.8 F (36.6 C)-98.6 F (37 C)] 98.6 F (37 C) (11/11 0755) Pulse Rate:  [82-108] 88 (11/11 0755) Cardiac Rhythm: Normal sinus rhythm;Bundle branch block;Heart block (11/10 1900) Resp:  [15-18] 17 (11/11 0755) BP: (129-157)/(53-95) 157/80 (11/11 0755) SpO2:  [97 %-98 %] 97 % (11/11 0755)  Intake/Output from previous day: 11/10 0701 - 11/11 0700 In: 1999.5 [NG/GT:1681.5; IV Piggyback:258] Out: 625 [Urine:625] Intake/Output this shift: Total I/O In: -  Out: 425 [Urine:425]  General appearance: alert, cooperative, and no distress Heart: regular rate and rhythm Lungs: clear to auscultation bilaterally Abdomen: soft, non-tender; bowel sounds normal; no masses,  no organomegaly Neuro: visible resting tremor UE  Lab Results: No results for input(s): "WBC", "HGB", "HCT", "PLT" in the last 72 hours. BMET:  Recent Labs    05/19/22 0350  NA 142  K 3.9  CL 104  CO2 32  GLUCOSE 161*  BUN 45*  CREATININE 2.00*  CALCIUM 13.5*    PT/INR: No results for input(s): "LABPROT", "INR" in the last 72 hours. ABG    Component Value Date/Time   PHART 7.450 05/09/2022 0824   HCO3 25.8 05/09/2022 0824   TCO2 27 05/09/2022 0824   ACIDBASEDEF 0.3 04/06/2022 0332   O2SAT 100 05/09/2022 0824   CBG (last 3)  Recent Labs    05/19/22 2357 05/20/22 0421 05/20/22 0754  GLUCAP 167* 145* 159*    Assessment/Plan: S/P Procedure(s) (LRB): LAPAROSCOPIC JEJUNOSTOMY (N/A) ESOPHAGOGASTRODUODENOSCOPY (EGD) (N/A) VIDEO ASSISTED THORACOSCOPY (Right)  CV- NSR with 1st degree block-  continue Clonidine patch and prn Hydralazine for HTN Pulm- off oxygen, no acute issues, JP drain output is minimal GI- remains NPO, continue tube feedings per nutrition consult Hypercalcemia- level is climbing, up to 13.6, ? Cause? Will discuss management with Dr. Cyndia Bent..  ID- on Zosyn, Diflucan for possible aspiration pneumonia Continue Lovenox for DVT prophylaxis   LOS: 15 days    Ellwood Handler, PA-C 05/20/2022   Chart reviewed, patient examined, agree with above. He has worsening hypercalcemia with low albumin and tremor is worsening. He is alert and oriented and walking in hall with mobility team. He says he feels ok except for tremor. He has had good urine output (? Too good) with rising BUN/creat and may be getting dehydrated from hypercalcemia. Discussed with pharmacy and will start IV saline, calcitonin and zoledronic acid. Followup on calcium and renal function.

## 2022-05-20 NOTE — Progress Notes (Signed)
Mobility Specialist Progress Note:   05/20/22 1423  Mobility  Activity Ambulated with assistance in hallway  Level of Assistance Contact guard assist, steadying assist  Assistive Device Four wheel walker  Distance Ambulated (ft) 150 ft  Activity Response Tolerated well  Mobility Referral Yes  $Mobility charge 1 Mobility   Pt received in chair and agreeable. Pt experiencing tremors in UE, worsened with ambulation. Pt left in chair with all needs met, call bell in reach, and family in room.   Andrey Campanile Mobility Specialist Please contact via SecureChat or  Rehab office at [phone]

## 2022-05-20 NOTE — Plan of Care (Signed)
  Problem: Education: Goal: Knowledge of General Education information will improve Description: Including pain rating scale, medication(s)/side effects and non-pharmacologic comfort measures Outcome: Progressing   Problem: Health Behavior/Discharge Planning: Goal: Ability to manage health-related needs will improve Outcome: Progressing   Problem: Clinical Measurements: Goal: Ability to maintain clinical measurements within normal limits will improve Outcome: Progressing Goal: Will remain free from infection Outcome: Progressing Goal: Diagnostic test results will improve Outcome: Progressing Goal: Respiratory complications will improve Outcome: Progressing Goal: Cardiovascular complication will be avoided Outcome: Progressing   Problem: Activity: Goal: Risk for activity intolerance will decrease Outcome: Progressing   Problem: Nutrition: Goal: Adequate nutrition will be maintained Outcome: Progressing   Problem: Coping: Goal: Level of anxiety will decrease Outcome: Progressing   Problem: Elimination: Goal: Will not experience complications related to bowel motility Outcome: Progressing Goal: Will not experience complications related to urinary retention Outcome: Progressing   Problem: Pain Managment: Goal: General experience of comfort will improve Outcome: Progressing   Problem: Safety: Goal: Ability to remain free from injury will improve Outcome: Progressing   Problem: Skin Integrity: Goal: Risk for impaired skin integrity will decrease Outcome: Progressing   Problem: Education: Goal: Knowledge of the prescribed therapeutic regimen will improve Outcome: Progressing   Problem: Bowel/Gastric: Goal: Gastrointestinal status for postoperative course will improve Outcome: Progressing   Problem: Clinical Measurements: Goal: Postoperative complications will be avoided or minimized Outcome: Progressing   Problem: Respiratory: Goal: Ability to maintain a clear  airway will improve Outcome: Progressing   Problem: Skin Integrity: Goal: Demonstration of wound healing without infection will improve Outcome: Progressing

## 2022-05-20 NOTE — Progress Notes (Signed)
MEDICATION RELATED CONSULT NOTE - INITIAL   Pharmacy Consult for treatment of hypercalcemia   Allergies  Allergen Reactions   Procaine     Other reaction(s): Other, Respiratory Distress   Chocolate     Hyperactivity, respiratory distress   Other     Novocaine- respiratory distress    Patient Measurements: Height: 6' (182.9 cm) Weight: (!) 144.6 kg (318 lb 12.8 oz) IBW/kg (Calculated) : 77.6  Vital Signs: Temp: 98.7 F (37.1 C) (11/11 1217) Temp Source: Oral (11/11 1217) BP: 150/88 (11/11 1217) Pulse Rate: 99 (11/11 1217) Intake/Output from previous day: 11/10 0701 - 11/11 0700 In: 1999.5 [NG/GT:1681.5; IV Piggyback:258] Out: 625 [Urine:625] Intake/Output from this shift: Total I/O In: -  Out: 425 [Urine:425]  Labs: Recent Labs    05/19/22 0350  CREATININE 2.00*   Estimated Creatinine Clearance: 57.3 mL/min (A) (by C-G formula based on SCr of 2 mg/dL (H)).   Microbiology: No results found for this or any previous visit (from the past 720 hour(s)).  Medical History: Past Medical History:  Diagnosis Date   Anxiety    Cancer ([place])    Esophageal Cancer   COVID 2022   mild case   Dyspnea    r/t chemo and radiation   Headache    History of kidney stones    Hypertension    Kidney infection    at age 51   Pneumonia    61 years old   Pre-diabetes    PTSD (post-traumatic stress disorder)    per pt, if woken up suddenly he "cocks back arm" as if to punch but usually wakes up enough to come to before he hits anyone    Assessment: 61 year old male s/p esophajectomy d/t esophageal adenocarcinoma. Course complicated by possible leak and aspiration pneumonia.   His calcium has has been elevated since admission at over 12. Labs yesterday show a bump up to 13.5 (corrected to over 15 given albumin of 2.1). His creatinine is also up to 2.0 so there may be a component of dehydration.   Discussed treatment option with surgery team, will give calcitonin and zolendronic  acid with NS for hydration today.  Recheck labs in am. Treatment may take a few days to see full effect.   Goal of Therapy:  Calcium<12 post treatment  Plan:  Calcitonin 400units bid x 4 Zolendronic acid '4mg'$  x1 NS'@100ml'$ /hr for now Recheck bmet/albumin in am  Erin Hearing PharmD., BCPS Clinical Pharmacist 05/20/2022 2:18 PM

## 2022-05-21 LAB — BASIC METABOLIC PANEL
Anion gap: 11 (ref 5–15)
BUN: 58 mg/dL — ABNORMAL HIGH (ref 8–23)
CO2: 32 mmol/L (ref 22–32)
Calcium: 14.6 mg/dL (ref 8.9–10.3)
Chloride: 109 mmol/L (ref 98–111)
Creatinine, Ser: 2.38 mg/dL — ABNORMAL HIGH (ref 0.61–1.24)
GFR, Estimated: 30 mL/min — ABNORMAL LOW (ref >60)
Glucose, Bld: 157 mg/dL — ABNORMAL HIGH (ref 70–99)
Potassium: 4.3 mmol/L (ref 3.5–5.1)
Sodium: 152 mmol/L — ABNORMAL HIGH (ref 135–145)

## 2022-05-21 LAB — ALBUMIN: Albumin: 2.8 g/dL — ABNORMAL LOW (ref 3.5–5.0)

## 2022-05-21 LAB — GLUCOSE, CAPILLARY
Glucose-Capillary: 105 mg/dL — ABNORMAL HIGH (ref 70–99)
Glucose-Capillary: 110 mg/dL — ABNORMAL HIGH (ref 70–99)
Glucose-Capillary: 137 mg/dL — ABNORMAL HIGH (ref 70–99)
Glucose-Capillary: 145 mg/dL — ABNORMAL HIGH (ref 70–99)
Glucose-Capillary: 152 mg/dL — ABNORMAL HIGH (ref 70–99)
Glucose-Capillary: 91 mg/dL (ref 70–99)
Glucose-Capillary: 93 mg/dL (ref 70–99)

## 2022-05-21 LAB — PREALBUMIN: Prealbumin: 24 mg/dL (ref 18–38)

## 2022-05-21 MED ORDER — PANCRELIPASE (LIP-PROT-AMYL) 10440-39150 UNITS PO TABS
20880.0000 [IU] | ORAL_TABLET | Freq: Once | ORAL | Status: AC
  Administered 2022-05-21: 20880 [IU]
  Filled 2022-05-21: qty 2

## 2022-05-21 MED ORDER — CALCITONIN (SALMON) 200 UNIT/ML IJ SOLN
800.0000 [IU] | Freq: Two times a day (BID) | INTRAMUSCULAR | Status: AC
  Administered 2022-05-21: 800 [IU] via SUBCUTANEOUS
  Filled 2022-05-21: qty 4

## 2022-05-21 MED ORDER — SODIUM BICARBONATE 650 MG PO TABS
650.0000 mg | ORAL_TABLET | Freq: Once | ORAL | Status: AC
  Administered 2022-05-21: 650 mg
  Filled 2022-05-21: qty 1

## 2022-05-21 NOTE — Progress Notes (Signed)
Date and time results received: 05/21/22 0510   Test: Calcium Critical Value: 14.6  Name of Provider Notified:  will notify MD on rounding by day RN. Pt being treated for Hypercalcemia - per Pharmacy note 05/20/2022 Erin Hearing, expected high level during treatment effective for a few days.  Orders Received? Or Actions Taken?: Pt being treated for hypercalcemia

## 2022-05-21 NOTE — Progress Notes (Addendum)
      [address]       [place] [address]             [phone]       12 Days Post-Op Procedure(s) (LRB): LAPAROSCOPIC JEJUNOSTOMY (N/A) ESOPHAGOGASTRODUODENOSCOPY (EGD) (N/A) VIDEO ASSISTED THORACOSCOPY (Right)  Subjective:  He states tremors are much worse today.  Objective: Vital signs in last 24 hours: Temp:  [98 F (36.7 C)-98.7 F (37.1 C)] 98.1 F (36.7 C) (11/12 1114) Pulse Rate:  [88-105] 105 (11/12 1114) Cardiac Rhythm: Sinus tachycardia;Bundle branch block (11/12 0708) Resp:  [15-23] 23 (11/12 1114) BP: (150-161)/(74-93) 153/86 (11/12 1114) SpO2:  [92 %-97 %] 92 % (11/12 1114)  Intake/Output from previous day: 11/11 0701 - 11/12 0700 In: 4077.7 [I.V.:1203.7; NG/GT:2640; IV Piggyback:234] Out: 2395 [Urine:2375; Drains:20] Intake/Output this shift: Total I/O In: -  Out: 725 [Urine:725]  General appearance: alert, cooperative, and no distress Heart: regular rate and rhythm Lungs: clear to auscultation bilaterally Abdomen: soft, non-tender; bowel sounds normal; no masses,  no organomegaly Extremities: visible tremor UE, worse than yesterday   Lab Results: No results for input(s): "WBC", "HGB", "HCT", "PLT" in the last 72 hours. BMET:  Recent Labs    05/19/22 0350 05/21/22 0430  NA 142 152*  K 3.9 4.3  CL 104 109  CO2 32 32  GLUCOSE 161* 157*  BUN 45* 58*  CREATININE 2.00* 2.38*  CALCIUM 13.5* 14.6*    PT/INR: No results for input(s): "LABPROT", "INR" in the last 72 hours. ABG    Component Value Date/Time   PHART 7.450 05/09/2022 0824   HCO3 25.8 05/09/2022 0824   TCO2 27 05/09/2022 0824   ACIDBASEDEF 0.3 04/06/2022 0332   O2SAT 100 05/09/2022 0824   CBG (last 3)  Recent Labs    05/21/22 0412 05/21/22 0824 05/21/22 1112  GLUCAP 137* 152* 93    Assessment/Plan: S/P Procedure(s) (LRB): LAPAROSCOPIC JEJUNOSTOMY (N/A) ESOPHAGOGASTRODUODENOSCOPY (EGD) (N/A) VIDEO ASSISTED THORACOSCOPY (Right)  CV- NSR with  1st degree AV Block, on Clonidine and prn Hydralazine Pulm- no acute issues, continue JP drain GI- remains NPO, continue tube feedings Hypercalcemia- appreciate pharmacy assistance, further increase today which is expected during treatment Hypernatremia- at 152, suspect this is related to treatment for hypercalcemia, monitor Renal- creatinine at 2.38, likely due to autodiuresis with hypercalcemia.Marland Kitchen leading to dehydration, may need to adjust fluids ID- on Zosyn, Diflucan for aspiration pneumonia   LOS: 16 days    Ellwood Handler, PA-C 05/21/2022   Chart reviewed, patient examined, agree with above. Received NS infusion, calcitonin 4 units/kg and zoledronic acid yesterday. Calcium actually increased further. He may not be calcitonin sensitive so will increase dose to 8 units/kg. Increase NS to 200/hr. Repeat labs in am.

## 2022-05-21 NOTE — Plan of Care (Signed)
  Problem: Education: Goal: Knowledge of General Education information will improve Description: Including pain rating scale, medication(s)/side effects and non-pharmacologic comfort measures Outcome: Progressing   Problem: Health Behavior/Discharge Planning: Goal: Ability to manage health-related needs will improve Outcome: Progressing   Problem: Clinical Measurements: Goal: Ability to maintain clinical measurements within normal limits will improve Outcome: Progressing Goal: Will remain free from infection Outcome: Progressing Goal: Diagnostic test results will improve Outcome: Progressing Goal: Respiratory complications will improve Outcome: Progressing Goal: Cardiovascular complication will be avoided Outcome: Progressing   Problem: Activity: Goal: Risk for activity intolerance will decrease Outcome: Progressing   Problem: Nutrition: Goal: Adequate nutrition will be maintained Outcome: Progressing   Problem: Coping: Goal: Level of anxiety will decrease Outcome: Progressing   Problem: Elimination: Goal: Will not experience complications related to bowel motility Outcome: Progressing Goal: Will not experience complications related to urinary retention Outcome: Progressing   Problem: Pain Managment: Goal: General experience of comfort will improve Outcome: Progressing   Problem: Safety: Goal: Ability to remain free from injury will improve Outcome: Progressing   Problem: Skin Integrity: Goal: Risk for impaired skin integrity will decrease Outcome: Progressing   Problem: Education: Goal: Knowledge of the prescribed therapeutic regimen will improve Outcome: Progressing   Problem: Bowel/Gastric: Goal: Gastrointestinal status for postoperative course will improve Outcome: Progressing   Problem: Nutritional: Goal: Ability to achieve adequate nutritional intake will improve Outcome: Progressing   Problem: Clinical Measurements: Goal: Postoperative  complications will be avoided or minimized Outcome: Progressing   Problem: Respiratory: Goal: Ability to maintain a clear airway will improve Outcome: Progressing   Problem: Skin Integrity: Goal: Demonstration of wound healing without infection will improve Outcome: Progressing

## 2022-05-22 LAB — BASIC METABOLIC PANEL
Anion gap: 5 (ref 5–15)
BUN: 53 mg/dL — ABNORMAL HIGH (ref 8–23)
CO2: 29 mmol/L (ref 22–32)
Calcium: 12.5 mg/dL — ABNORMAL HIGH (ref 8.9–10.3)
Chloride: 121 mmol/L — ABNORMAL HIGH (ref 98–111)
Creatinine, Ser: 2.46 mg/dL — ABNORMAL HIGH (ref 0.61–1.24)
GFR, Estimated: 29 mL/min — ABNORMAL LOW (ref >60)
Glucose, Bld: 89 mg/dL (ref 70–99)
Potassium: 4.1 mmol/L (ref 3.5–5.1)
Sodium: 155 mmol/L — ABNORMAL HIGH (ref 135–145)

## 2022-05-22 LAB — GLUCOSE, CAPILLARY
Glucose-Capillary: 84 mg/dL (ref 70–99)
Glucose-Capillary: 90 mg/dL (ref 70–99)
Glucose-Capillary: 112 mg/dL — ABNORMAL HIGH (ref 70–99)
Glucose-Capillary: 92 mg/dL (ref 70–99)
Glucose-Capillary: 119 mg/dL — ABNORMAL HIGH (ref 70–99)
Glucose-Capillary: 106 mg/dL — ABNORMAL HIGH (ref 70–99)

## 2022-05-22 LAB — ALBUMIN: Albumin: 2.7 g/dL — ABNORMAL LOW (ref 3.5–5.0)

## 2022-05-22 NOTE — Progress Notes (Signed)
Request to IR for assessment of clogged J-tube, patient currently has a 16 Fr straight J-tube placed 05/09/22 by CT surgery during the course of esophageal stent placement.   Per patient J-tube is being flushed after each use however he states "some nurses flush a lot and some only flush a little." He notes the J-tube to be working yesterday afternoon and then in the evening it was clogged after receiving medications. He reports staff has tried [employer] and something else was put in the tube (he is not sure what) to try and break up the clog.  There is a milky white substance within tubing on exam, no leakage from site. Lopez valve removed and tube flushed aggressively in a pulsating fashion with a christmas tree adapter and normal saline flush until resistance was no longer felt. The tube was then flushed with 30 cc NS without resistance or leakage from site.  Tube is ready for immediate use.  Recommend flushing with at least 20 cc normal saline each time, particularly after giving crushed medications, to ensure patency of tube.  IR remains available as needed. Please call with questions or concerns.  Candiss Norse, PA-C

## 2022-05-22 NOTE — Plan of Care (Signed)
  Problem: Education: Goal: Knowledge of General Education information will improve Description: Including pain rating scale, medication(s)/side effects and non-pharmacologic comfort measures Outcome: Progressing   Problem: Health Behavior/Discharge Planning: Goal: Ability to manage health-related needs will improve Outcome: Progressing   Problem: Clinical Measurements: Goal: Ability to maintain clinical measurements within normal limits will improve Outcome: Progressing Goal: Will remain free from infection Outcome: Progressing Goal: Diagnostic test results will improve Outcome: Progressing Goal: Respiratory complications will improve Outcome: Progressing Goal: Cardiovascular complication will be avoided Outcome: Progressing   Problem: Activity: Goal: Risk for activity intolerance will decrease Outcome: Progressing   Problem: Nutrition: Goal: Adequate nutrition will be maintained Outcome: Progressing   Problem: Coping: Goal: Level of anxiety will decrease Outcome: Progressing   Problem: Elimination: Goal: Will not experience complications related to bowel motility Outcome: Progressing Goal: Will not experience complications related to urinary retention Outcome: Progressing   Problem: Pain Managment: Goal: General experience of comfort will improve Outcome: Progressing   Problem: Safety: Goal: Ability to remain free from injury will improve Outcome: Progressing   Problem: Skin Integrity: Goal: Risk for impaired skin integrity will decrease Outcome: Progressing   Problem: Education: Goal: Knowledge of the prescribed therapeutic regimen will improve Outcome: Progressing   Problem: Bowel/Gastric: Goal: Gastrointestinal status for postoperative course will improve Outcome: Progressing   Problem: Nutritional: Goal: Ability to achieve adequate nutritional intake will improve Outcome: Progressing

## 2022-05-22 NOTE — Progress Notes (Signed)
Mobility Specialist Progress Note    05/22/22 1458  Mobility  Activity Ambulated with assistance in hallway  Level of Assistance Minimal assist, patient does 75% or more  Assistive Device Four wheel walker  Distance Ambulated (ft) 250 ft (140+110)  Activity Response Tolerated well  Mobility Referral Yes  $Mobility charge 1 Mobility   Pre-Mobility: 91 HR, 94% SpO2 During Mobility: 120 HR Post-Mobility: 98 HR, 94% SpO2  Pt received in bed and agreeable. No complaints on walk. On 2LO2. Took x1 seated rest break. Returned to bed with call bell in reach.    Hildred Alamin Mobility Specialist  Please Psychologist, sport and exercise or Rehab Office at [phone]

## 2022-05-22 NOTE — Progress Notes (Addendum)
      [address]       [place] [address]             [phone]       13 Days Post-Op Procedure(s) (LRB): LAPAROSCOPIC JEJUNOSTOMY (N/A) ESOPHAGOGASTRODUODENOSCOPY (EGD) (N/A) VIDEO ASSISTED THORACOSCOPY (Right)  Subjective: Patient had a very rough Sunday and evening. He continues to have tremors  Objective: Vital signs in last 24 hours: Temp:  [97.6 F (36.4 C)-98.1 F (36.7 C)] 98.1 F (36.7 C) (11/13 0413) Pulse Rate:  [91-105] 91 (11/13 0413) Cardiac Rhythm: Normal sinus rhythm;Bundle branch block (11/12 2011) Resp:  [17-23] 17 (11/13 0413) BP: (153-172)/(71-95) 172/73 (11/13 0413) SpO2:  [92 %-97 %] 95 % (11/13 0413)     Intake/Output from previous day: 11/12 0701 - 11/13 0700 In: 2276 [I.V.:2000; IV Piggyback:276] Out: 2875 [Urine:2875]   Physical Exam:  Cardiovascular: RRR Pulmonary: Clear to auscultation bilaterally Abdomen: Soft, non tender, bowel sounds present. Extremities: Trace bilateral lower extremity edema. Wounds: Clean and dry.  No erythema or signs of infection. Drains: Serous output from right chest tube drain  Lab Results: CBC: No results for input(s): "WBC", "HGB", "HCT", "PLT" in the last 72 hours.  BMET:  Recent Labs    05/21/22 0430 05/22/22 0420  NA 152* 155*  K 4.3 4.1  CL 109 121*  CO2 32 29  GLUCOSE 157* 89  BUN 58* 53*  CREATININE 2.38* 2.46*  CALCIUM 14.6* 12.5*     PT/INR: No results for input(s): "LABPROT", "INR" in the last 72 hours. ABG:  INR: Will add last result for INR, ABG once components are confirmed Will add last 4 CBG results once components are confirmed  Assessment/Plan:  1. CV - First degree heart block. On Clonidine patch 0.1 mg Hydralazine 20 mg IV tid. Once able to take liquids, will resume Amlodipine 10 mg daily, Lisinopril 40 mg daily. 2.  Pulmonary - On room air. Right JP drain with serous output and minimal output as not recorded.  Encourage incentive spirometer 3.  Anemia- Last H and H  9.7 and 30.6 4. GI-NPO. CBGs 105/91/84. J tube clogged -? IR consult but will discuss with [name]. Pre albumin yesterday 24 (improved) and Albumin 2.7 this am 5. ID-on Zosyn (possible aspiration PNA) and Diflucan. Continue Zosyn as had esophageal leak on this admission. Per Dr.Lightfoot, stop Diflucan 6. On Lovenox for DVT 7. Hypercalcemia-Calcium up to 155 this am IV saline, Calcitonin, and Zolendonic acid given. Possibly related to dehydration. Await Hydroxy Vitamin D results 8. AKI-Creatinine this am 2.46. Had previously been given several doses of IV Lasix. Monitor and avoid nephrotoxic agents. 9. Deconditioned-continue PT. Home PT and OT arranged  Carlyn Lemke M ZimmermanPA-C 05/22/2022,6:59 AM    Agree with above  IR for J tube exchange Correcting calcium  Harrell O Lightfoot

## 2022-05-22 NOTE — Progress Notes (Signed)
OT Cancellation Note  Patient Details Name: Keith Moreno MRN: [id number] DOB: Dec 07, 1960   Cancelled Treatment:    Reason Eval/Treat Not Completed: Patient declined, no reason specified (pt declined OOB activity reporting he has walked 3x already today. OT to continue efforts.)  Elliot Cousin 05/22/2022, 4:31 PM

## 2022-05-22 NOTE — Progress Notes (Signed)
Physical Therapy Treatment Patient Details Name: Keith Moreno MRN: [id number] DOB: 1960-11-30 Today's Date: 05/22/2022   History of Present Illness Pt is 61 y/o male admitted [facility]/05/22 from cancer center with weakness, dizziness, difficulty swallowing, admitted for hypercalcemia. S/p esophagogastroscopy, VATS 10/31. Of note, pt with recent prolonged hospitalization (9/27-10/20) after esophagectomy with placement of J-tube. Other PMH includes esophageal CA, HTN, sacral wound.    PT Comments    Pt with slow progression for mobility, unable to progress ambulation endurance due to SOB and onset of fatigue. Today's session focused on safe ambulation with rollator 100+120 ft RW min guard. 1 prolonged seated rest break necessary due to DOE. Pt remains limited by generalized weakness, decreased activity tolerance, and impaired balance strategies/postural reactions. Continue to recommend acute PT services to maximize functional mobility and independence prior to d/c to HHPT, pending medical appropriateness.    Recommendations for follow up therapy are one component of a multi-disciplinary discharge planning process, led by the attending physician.  Recommendations may be updated based on patient status, additional functional criteria and insurance authorization.  Follow Up Recommendations  Home health PT     Assistance Recommended at Discharge Intermittent Supervision/Assistance  Patient can return home with the following A little help with bathing/dressing/bathroom;Assistance with cooking/housework;Assist for transportation;Help with stairs or ramp for entrance   Equipment Recommendations  None recommended by PT    Recommendations for Other Services       Precautions / Restrictions Precautions Precautions: Fall;Other (comment) Precaution Comments: JP drain; J-tube Restrictions Weight Bearing Restrictions: No     Mobility  Bed Mobility Overal bed mobility: Needs Assistance              General bed mobility comments: pt up in recliner upon arrival; did demonstrate weight shifitng in recliner mod ind    Transfers Overall transfer level: Needs assistance Equipment used: Rollator (4 wheels) Transfers: Sit to/from Stand Sit to Stand: Modified independent (Device/Increase time)           General transfer comment: mod ind to stand from recliner and rollator seat, receptive to cues about how to make transfer easier with hand placement and weight shifting    Ambulation/Gait Ambulation/Gait assistance: Supervision Gait Distance (Feet): 220 Feet Assistive device: Rollator (4 wheels) Gait Pattern/deviations: Step-through pattern, Decreased stride length Gait velocity: Decreased     General Gait Details: slow cautious gait with increased SOB during ambulation today, required prolonged seated rest break to recover back to baseline RR. unable to progress ambulation endurance due to fatigue and increase in severity of shakiness   Stairs             Wheelchair Mobility    Modified Rankin (Stroke Patients Only)       Balance Overall balance assessment: Needs assistance Sitting-balance support: Feet supported, No upper extremity supported Sitting balance-Leahy Scale: Good Sitting balance - Comments: sitting up in recliner without external support and no LOB, able to independently put recliner legs up and down   Standing balance support: Bilateral upper extremity supported Standing balance-Leahy Scale: Fair Standing balance comment: appears unstead due to UE shakes but manages dynamic standing balance well, especially with rollator repositioning during sit to stand transfers                            Cognition Arousal/Alertness: Awake/alert Behavior During Therapy: Flat affect Overall Cognitive Status: Impaired/Different from baseline Area of Impairment: Problem solving, Following commands, Safety/judgement  Following Commands: Follows one step commands with increased time Safety/Judgement: Decreased awareness of deficits   Problem Solving: Slow processing, Requires verbal cues, Requires tactile cues General Comments: pt able to engage in conversation during session with appropriate responses, dual tasking during ambulation. pt with improved spirits although still continues to maintain flat affect.        Exercises      General Comments General comments (skin integrity, edema, etc.): continue to endose SOB during ambulation, SpO2 stable on 2L [place] and HR up to high 120s bpm during session      Pertinent Vitals/Pain Pain Assessment Faces Pain Scale: Hurts little more Pain Location: back Pain Descriptors / Indicators: Constant, Discomfort, Guarding Pain Intervention(s): Monitored during session, Repositioned, RN gave pain meds during session    Home Living                          Prior Function            PT Goals (current goals can now be found in the care plan section) Acute Rehab PT Goals Patient Stated Goal: get back home PT Goal Formulation: With patient Time For Goal Achievement: 05/24/22 Potential to Achieve Goals: Good Progress towards PT goals: Progressing toward goals    Frequency    Min 3X/week      PT Plan Current plan remains appropriate    Co-evaluation              AM-PAC PT "6 Clicks" Mobility   Outcome Measure  Help needed turning from your back to your side while in a flat bed without using bedrails?: A Little Help needed moving from lying on your back to sitting on the side of a flat bed without using bedrails?: A Little Help needed moving to and from a bed to a chair (including a wheelchair)?: A Little Help needed standing up from a chair using your arms (e.g., wheelchair or bedside chair)?: None Help needed to walk in hospital room?: A Little Help needed climbing 3-5 steps with a railing? : A Little 6 Click Score: 19    End  of Session Equipment Utilized During Treatment: Gait belt;Oxygen Activity Tolerance: Patient tolerated treatment well;Patient limited by fatigue Patient left: with family/visitor present;with call bell/phone within reach;in chair Nurse Communication: Mobility status PT Visit Diagnosis: Other abnormalities of gait and mobility (R26.89);Difficulty in walking, not elsewhere classified (R26.2);Muscle weakness (generalized) (M62.81)     Time: 1010-1045 PT Time Calculation (min) (ACUTE ONLY): 35 min  Charges:  $Therapeutic Exercise: 8-22 mins $Therapeutic Activity: 8-22 mins                    Alric Ran, SPT    [place] Keith Moreno 05/22/2022, 12:07 PM

## 2022-05-23 LAB — BASIC METABOLIC PANEL
Anion gap: 5 (ref 5–15)
BUN: 49 mg/dL — ABNORMAL HIGH (ref 8–23)
CO2: 27 mmol/L (ref 22–32)
Calcium: 11 mg/dL — ABNORMAL HIGH (ref 8.9–10.3)
Chloride: 125 mmol/L — ABNORMAL HIGH (ref 98–111)
Creatinine, Ser: 2.33 mg/dL — ABNORMAL HIGH (ref 0.61–1.24)
GFR, Estimated: 31 mL/min — ABNORMAL LOW (ref >60)
Glucose, Bld: 129 mg/dL — ABNORMAL HIGH (ref 70–99)
Potassium: 3.8 mmol/L (ref 3.5–5.1)
Sodium: 157 mmol/L — ABNORMAL HIGH (ref 135–145)

## 2022-05-23 LAB — GLUCOSE, CAPILLARY
Glucose-Capillary: 106 mg/dL — ABNORMAL HIGH (ref 70–99)
Glucose-Capillary: 108 mg/dL — ABNORMAL HIGH (ref 70–99)
Glucose-Capillary: 118 mg/dL — ABNORMAL HIGH (ref 70–99)
Glucose-Capillary: 148 mg/dL — ABNORMAL HIGH (ref 70–99)
Glucose-Capillary: 160 mg/dL — ABNORMAL HIGH (ref 70–99)
Glucose-Capillary: 94 mg/dL (ref 70–99)

## 2022-05-23 LAB — CBC
HCT: 28.2 % — ABNORMAL LOW (ref 39.0–52.0)
Hemoglobin: 8.2 g/dL — ABNORMAL LOW (ref 13.0–17.0)
MCH: 28.5 pg (ref 26.0–34.0)
MCHC: 29.1 g/dL — ABNORMAL LOW (ref 30.0–36.0)
MCV: 97.9 fL (ref 80.0–100.0)
Platelets: 175 10*3/uL (ref 150–400)
RBC: 2.88 MIL/uL — ABNORMAL LOW (ref 4.22–5.81)
RDW: 15.9 % — ABNORMAL HIGH (ref 11.5–15.5)
WBC: 3.8 10*3/uL — ABNORMAL LOW (ref 4.0–10.5)
nRBC: 0 % (ref 0.0–0.2)

## 2022-05-23 LAB — CALCIUM, IONIZED: Calcium, Ionized, Serum: 8.5 mg/dL — ABNORMAL HIGH (ref 4.5–5.6)

## 2022-05-23 MED ORDER — FREE WATER
150.0000 mL | Status: DC
  Administered 2022-05-23 – 2022-05-26 (×35): 150 mL

## 2022-05-23 MED ORDER — FREE WATER: 150.0000 mL | Status: DC

## 2022-05-23 MED ORDER — OSMOLITE 1.5 CAL PO LIQD
1710.0000 mL | ORAL | Status: DC
  Administered 2022-05-23 – 2022-05-24 (×2): 1710 mL
  Administered 2022-05-25: 1000 mL
  Administered 2022-05-26 – 2022-05-29 (×4): 1710 mL
  Administered 2022-05-30 – 2022-06-01 (×3): 1000 mL
  Administered 2022-06-02: 1710 mL
  Administered 2022-06-03: 1000 mL
  Administered 2022-06-04: 1710 mL
  Filled 2022-05-23: qty 2000
  Filled 2022-05-23: qty 1000

## 2022-05-23 MED ORDER — FREE WATER
100.0000 mL | Status: DC
  Administered 2022-05-23: 100 mL

## 2022-05-23 NOTE — Progress Notes (Signed)
Mobility Specialist Progress Note    05/23/22 1030  Mobility  Activity Ambulated with assistance in hallway  Level of Assistance Standby assist, set-up cues, supervision of patient - no hands on  Assistive Device Four wheel walker  Distance Ambulated (ft) 220 ft (120+100)  Activity Response Tolerated well  Mobility Referral Yes  $Mobility charge 1 Mobility   Pre-Mobility: 91 HR, 95% SpO2 During Mobility: 120 HR, 93-95% SpO2 on RA Post-Mobility: 106 HR, 147/81 (101) BP, 94% SpO2 on RA  Pt received in chair and agreeable. C/o some pain but stated he just received meds. Took x1 extended seated rest break. Encouraged pursed lip breathing. Tolerated on RA. Returned to chair with call bell in reach. Left on RA. RN notified.   Hildred Alamin Mobility Specialist  Please Psychologist, sport and exercise or Rehab Office at [phone]

## 2022-05-23 NOTE — Progress Notes (Signed)
Nutrition Follow-up  DOCUMENTATION CODES:   Non-severe (moderate) malnutrition in context of chronic illness  INTERVENTION:   Tube Feeding via J-tube:  Increase to Peptamen 1.5 at 95 ml/hr via J-tube x 18 hours daily. This is patient goal Goal regimen provides 2565 kcals, 107 g of protein and 1300 mL of free water   Pro-Source TF20 60 mL BID, each packet provides 20 g of protein and 80 kcals Pro-Source TF currently ordered for additional protein. but can be discontinued at discharge.   Free water 150 mL q 2 hours per tube. Total free water with TF plus increased FWF: 3100 mL free water. This number does not include additional free water flushes with meds, etc. If sodium does not improve with increased flush, recommend considering short term IVF until free water deficit resolved then can maintain hydration with appropriate free water flush  NUTRITION DIAGNOSIS:   Moderate Malnutrition related to chronic illness (esophageal cancer) as evidenced by mild fat depletion, moderate muscle depletion, percent weight loss (8.9% weight loss in less than 5 months).  Progressing  GOAL:   Patient will meet greater than or equal to 90% of their needs  Met via TF   MONITOR:   Labs, Weight trends, TF tolerance, Skin, I & O's  REASON FOR ASSESSMENT:   Consult Enteral/tube feeding initiation and management  ASSESSMENT:   61 year old male who presented on 10/27 as a direct admission from the [facility] with poor oral intake, dysphagia with fluids, hypercalcemia. PMH of esophageal adenocarcinoma s/p Ivor Lewis esophagectomy and J-tube placement on 04/05/22 (later removed prior to discharge), HTN, pre-DM.  Pt appears to be in much better spirits today. Looks better and talkative  SOB while talking today. Encouraged pt to take breaks.  +tremors.   Tolerating Peptamen 1.5 at 90 ml/hr x 18 hours. Discussed increasing to goal rate of 95 ml/hr today with pt and wife. Both agreeable  Noted  hypernatremia Noted J-tube clogged for about 24 hours and unclogged yesterday but that was a period of missed free water.  11/10 RD increased free water from 100 mL q 6 hours (400 mL) to 150 mL q 4 hours (900 mL).   Based on current sodium and current weight (question accuracy), pt with free water deficit around 10 L. May require IVF to correct. Increased water flushes should def help.   Still no new weight, recommend new weight as no weight since admission.   Calcium improving but remains elevated. PTH 62 (wdl)  Pt is well versed currently with how to care for his J-tube with regards to flushing and med administration.   Wife also indicates she has been in contact with [facility] and has already picked up the Osmolite 1.5 cases  Labs: sodium 157 (H), corrected calcium 12 (albumin 2.7) Meds: reviewed  Diet Order:   Diet Order             Diet NPO time specified  Diet effective now                   EDUCATION NEEDS:   Education needs have been addressed  Skin:  Skin Assessment: Skin Integrity Issues: Skin Integrity Issues:: Stage II, Incisions Stage II: L buttocks Incisions: abdomen, R chest  Last BM:  11/10  Height:   Ht Readings from Last 1 Encounters:  05/11/22 6' (1.829 m)    Weight:   Wt Readings from Last 1 Encounters:  05/09/22 (!) 144.6 kg    Ideal Body Weight:  80.9 kg  BMI:  Body mass index is 43.24 kg/m.  Estimated Nutritional Needs:   Kcal:  2500-2700  Protein:  125-145 grams  Fluid:  >2.2 L   [place]  MS, RDN, LDN, CNSC Registered Dietitian 3 Clinical Nutrition RD Pager and On-Call Pager Number Located in Amion   

## 2022-05-23 NOTE — Progress Notes (Addendum)
      [address]       [place] [address]             [phone]       14 Days Post-Op Procedure(s) (LRB): LAPAROSCOPIC JEJUNOSTOMY (N/A) ESOPHAGOGASTRODUODENOSCOPY (EGD) (N/A) VIDEO ASSISTED THORACOSCOPY (Right)  Subjective: Patient has difficulty with phlegm and tremors.  Objective: Vital signs in last 24 hours: Temp:  [97.6 F (36.4 C)-98.4 F (36.9 C)] 98.1 F (36.7 C) (11/14 0314) Pulse Rate:  [76-102] 76 (11/14 0314) Cardiac Rhythm: Normal sinus rhythm;Bundle branch block;Heart block (11/13 1900) Resp:  [17-24] 19 (11/14 0314) BP: (115-168)/(64-99) 115/72 (11/14 0314) SpO2:  [94 %-97 %] 97 % (11/14 0314)     Intake/Output from previous day: 11/13 0701 - 11/14 0700 In: 6631.3 [I.V.:4653.5; NG/GT:1827.8; IV Piggyback:149.9] Out: 2200 [Urine:2200]   Physical Exam:  Cardiovascular: RRR Pulmonary: Clear to auscultation bilaterally Abdomen: Soft, non tender, bowel sounds present. Extremities: No lower extremity edema. Wounds: Clean and dry.  No erythema or signs of infection. Drains: Serous output from right chest tube drain  Lab Results: CBC: Recent Labs    05/23/22 0320  WBC 3.8*  HGB 8.2*  HCT 28.2*  PLT 175    BMET:  Recent Labs    05/22/22 0420 05/23/22 0320  NA 155* 157*  K 4.1 3.8  CL 121* 125*  CO2 29 27  GLUCOSE 89 129*  BUN 53* 49*  CREATININE 2.46* 2.33*  CALCIUM 12.5* 11.0*     PT/INR: No results for input(s): "LABPROT", "INR" in the last 72 hours. ABG:  INR: Will add last result for INR, ABG once components are confirmed Will add last 4 CBG results once components are confirmed  Assessment/Plan:  1. CV - First degree heart block. SBP improved. On Clonidine patch 0.1 mg Hydralazine 20 mg IV tid.  2.  Pulmonary - On room air. Right JP drain with serous output and minimal output as not recorded.  Encourage incentive spirometer 3. Anemia- H and H this am decreased to 8.2 and 28.2 4. GI-NPO. CBGs  119/106/118. J tube clogged -IR able to use saline flush to unclog J tube drain yesterday. Pre albumin yesterday 24 (improved) and Albumin 2.7 this am 5. ID-on Zosyn (possible aspiration PNA). Diflucan stopped 11/13. Continue Zosyn as had esophageal leak on this admission.  6. On Lovenox for DVT 7. Hypernatremia and hypercalcemia-Sodium up to 157 this am. Calcium decreased to 11. He has been given IV saline, Calcitonin, and Zolendonic acid given. Possibly related to dehydration. Await Hydroxy vitamin D result 8. AKI-Creatinine this am slightly decreased to 2.33. Had previously been given several doses of IV Lasix. Monitor and avoid nephrotoxic agents. 9. Deconditioned-continue PT. Home PT and OT arranged 10. Leukopenia-WBC 3800 this am  Keith Liston Alba PA-C 05/23/2022,6:57 AM    J tube working WBC down Calcium improving [place]

## 2022-05-23 NOTE — Progress Notes (Signed)
Occupational Therapy Treatment Patient Details Name: Keith Moreno MRN: [id number] DOB: 1961-05-22 Today's Date: 05/23/2022   History of present illness Pt is 61 y/o male admitted 05/05/22 from cancer center with weakness, dizziness, difficulty swallowing, admitted for hypercalcemia. S/p esophagogastroscopy, VATS 10/31. Of note, pt with recent prolonged hospitalization (9/27-10/20) after esophagectomy with placement of J-tube. Other PMH includes esophageal CA, HTN, sacral wound.   OT comments  Pt is making good progress. He tolerated >household distance ambulation on RA with SpO2 >90% and HR to 120s. He required a two minute sitting rest break. Pt hyperfocuesed on lines this date, and needing cues for attention. Wife present and asking about pt goals, encouraged pt to start completing oral hygiene while standing at the sink 2x daily, he declined stating he will not stand at home to do it. OT to continue acute efforts. POC remains appropriate.    Recommendations for follow up therapy are one component of a multi-disciplinary discharge planning process, led by the attending physician.  Recommendations may be updated based on patient status, additional functional criteria and insurance authorization.    Follow Up Recommendations  Home health OT     Assistance Recommended at Discharge Intermittent Supervision/Assistance  Patient can return home with the following  A little help with walking and/or transfers;Assistance with cooking/housework;Assist for transportation;Help with stairs or ramp for entrance;A lot of help with bathing/dressing/bathroom   Equipment Recommendations  None recommended by OT       Precautions / Restrictions Precautions Precautions: Fall;Other (comment) Precaution Comments: JP drain; J-tube Restrictions Weight Bearing Restrictions: No       Mobility Bed Mobility               General bed mobility comments: in chair upon arrival    Transfers Overall  transfer level: Needs assistance Equipment used: Rollator (4 wheels) Transfers: Sit to/from Stand Sit to Stand: Supervision                 Balance Overall balance assessment: Needs assistance Sitting-balance support: Feet supported, No upper extremity supported Sitting balance-Leahy Scale: Good     Standing balance support: Bilateral upper extremity supported                               ADL either performed or assessed with clinical judgement   ADL Overall ADL's : Needs assistance/impaired       Grooming Details (indicate cue type and reason): encouraged pt begin brushing teeth 2x a day while standing at the sink. pt declined                             Functional mobility during ADLs: Supervision/safety;Rollator (4 wheels) General ADL Comments: declined all ADLs despite education.    Extremity/Trunk Assessment Upper Extremity Assessment Upper Extremity Assessment: RUE deficits/detail;LUE deficits/detail RUE Deficits / Details: significant tremor, pt with history of surgery and hardware in his shoulder with longstanding limitations LUE Deficits / Details: significant tremor   Lower Extremity Assessment Lower Extremity Assessment: Defer to PT evaluation        Vision   Vision Assessment?: No apparent visual deficits   Perception     Praxis      Cognition Arousal/Alertness: Awake/alert Behavior During Therapy: Flat affect Overall Cognitive Status: Impaired/Different from baseline Area of Impairment: Problem solving, Following commands, Safety/judgement  Following Commands: Follows one step commands with increased time Safety/Judgement: Decreased awareness of deficits   Problem Solving: Slow processing, Requires verbal cues, Requires tactile cues General Comments: very particular about lines, hyperfocused on where things are and routine of tube feedings. Able to verbalize importance of mobility and  states his goal is to walk around malls/stores with his wife for 1-2 hours              General Comments VSS on RA, HR to 120s with mobility    Pertinent Vitals/ Pain       Pain Assessment Pain Assessment: Faces Faces Pain Scale: Hurts a little bit Pain Location: back Pain Descriptors / Indicators: Constant, Discomfort, Guarding Pain Intervention(s): Limited activity within patient's tolerance, Monitored during session   Frequency  Min 2X/week        Progress Toward Goals  OT Goals(current goals can now be found in the care plan section)  Progress towards OT goals: Progressing toward goals  Acute Rehab OT Goals Patient Stated Goal: to go home next week OT Goal Formulation: With patient Time For Goal Achievement: 05/25/22 Potential to Achieve Goals: Good ADL Goals Pt Will Perform Grooming: with modified independence;standing Pt Will Perform Upper Body Bathing: with set-up;sitting Pt Will Perform Lower Body Bathing: with modified independence;sit to/from stand Pt Will Perform Upper Body Dressing: with set-up;sitting;standing Pt Will Perform Lower Body Dressing: with modified independence;sit to/from stand Pt Will Transfer to Toilet: with modified independence;regular height toilet;ambulating Pt/caregiver will Perform Home Exercise Program: Increased strength;Both right and left upper extremity;With theraband;With written HEP provided Additional ADL Goal #1: pt will indep recall at least 2 energy conservation strategies to apply in the home setting  Plan Discharge plan remains appropriate       AM-PAC OT "6 Clicks" Daily Activity     Outcome Measure   Help from another person eating meals?: None Help from another person taking care of personal grooming?: A Little Help from another person toileting, which includes using toliet, bedpan, or urinal?: A Little Help from another person bathing (including washing, rinsing, drying)?: A Lot Help from another person to put on  and taking off regular upper body clothing?: A Little Help from another person to put on and taking off regular lower body clothing?: A Lot 6 Click Score: 17    End of Session Equipment Utilized During Treatment: Rollator (4 wheels)  OT Visit Diagnosis: Unsteadiness on feet (R26.81);Muscle weakness (generalized) (M62.81)   Activity Tolerance Patient tolerated treatment well   Patient Left in chair;with call bell/phone within reach;with family/visitor present   Nurse Communication Other (comment)        Time: 1245-8099 OT Time Calculation (min): 23 min  Charges: OT General Charges $OT Visit: 1 Visit OT Treatments $Therapeutic Activity: 23-37 mins  Elliot Cousin 05/23/2022, 5:09 PM

## 2022-05-24 ENCOUNTER — Ambulatory Visit: Payer: No Typology Code available for payment source | Admitting: Physician Assistant

## 2022-05-24 ENCOUNTER — Encounter: Payer: No Typology Code available for payment source | Admitting: Dietician

## 2022-05-24 ENCOUNTER — Other Ambulatory Visit: Payer: No Typology Code available for payment source

## 2022-05-24 ENCOUNTER — Telehealth: Payer: Self-pay | Admitting: Dietician

## 2022-05-24 LAB — GLUCOSE, CAPILLARY
Glucose-Capillary: 109 mg/dL — ABNORMAL HIGH (ref 70–99)
Glucose-Capillary: 112 mg/dL — ABNORMAL HIGH (ref 70–99)
Glucose-Capillary: 119 mg/dL — ABNORMAL HIGH (ref 70–99)
Glucose-Capillary: 140 mg/dL — ABNORMAL HIGH (ref 70–99)
Glucose-Capillary: 152 mg/dL — ABNORMAL HIGH (ref 70–99)
Glucose-Capillary: 94 mg/dL (ref 70–99)

## 2022-05-24 LAB — BASIC METABOLIC PANEL
Anion gap: 6 (ref 5–15)
BUN: 46 mg/dL — ABNORMAL HIGH (ref 8–23)
CO2: 25 mmol/L (ref 22–32)
Calcium: 10.2 mg/dL (ref 8.9–10.3)
Chloride: 128 mmol/L — ABNORMAL HIGH (ref 98–111)
Creatinine, Ser: 2.1 mg/dL — ABNORMAL HIGH (ref 0.61–1.24)
GFR, Estimated: 35 mL/min — ABNORMAL LOW (ref >60)
Glucose, Bld: 139 mg/dL — ABNORMAL HIGH (ref 70–99)
Potassium: 3.6 mmol/L (ref 3.5–5.1)
Sodium: 159 mmol/L — ABNORMAL HIGH (ref 135–145)

## 2022-05-24 MED ORDER — SODIUM CHLORIDE 0.45 % IV SOLN: INTRAVENOUS | Status: DC

## 2022-05-24 NOTE — Progress Notes (Signed)
Physical Therapy Treatment Patient Details Name: Keith Moreno MRN: [id number] DOB: 01-Jan-1961 Today's Date: 05/24/2022   History of Present Illness Pt is 61 y/o male admitted 05/05/22 from cancer center with weakness, dizziness, difficulty swallowing, admitted for hypercalcemia. S/p esophagogastroscopy, VATS 10/31. Of note, pt with recent prolonged hospitalization (9/27-10/20) after esophagectomy with placement of J-tube. Other PMH includes esophageal CA, HTN, sacral wound.    PT Comments    Pt progressing with mobility. Today's session focused on safe ambulation with challenging increased time in single limb stance to best re-create stair environment due to pt declining trial of stairs today. Ambulated 100 ft x 2 min guard with HR up to 136 before taking a seated rest break. Pt remains limited by generalized weakness, decreased activity tolerance, and impaired balance strategies/postural reactions. Continue to recommend acute PT services to maximize functional mobility and independence prior to d/c to HHPT.  HR with brief spike up to 136 bpm during mobility    Recommendations for follow up therapy are one component of a multi-disciplinary discharge planning process, led by the attending physician.  Recommendations may be updated based on patient status, additional functional criteria and insurance authorization.  Follow Up Recommendations  Home health PT     Assistance Recommended at Discharge Intermittent Supervision/Assistance  Patient can return home with the following A little help with bathing/dressing/bathroom;Assistance with cooking/housework;Assist for transportation;Help with stairs or ramp for entrance;A little help with walking and/or transfers   Equipment Recommendations  None recommended by PT    Recommendations for Other Services       Precautions / Restrictions Precautions Precautions: Fall;Other (comment) Precaution Comments: JP drain; J-tube; intermittent  shakiness Restrictions Weight Bearing Restrictions: No     Mobility  Bed Mobility Overal bed mobility: Needs Assistance             General bed mobility comments: in chair upon arrival, mod independent for scooting hips in recliner with use of B arm rests    Transfers Overall transfer level: Needs assistance Equipment used: Rollator (4 wheels) Transfers: Sit to/from Stand Sit to Stand: Supervision           General transfer comment: supervision today for sit to stand from recliner and rollator due to increasing shakiness and HR fluctuation with mobility    Ambulation/Gait Ambulation/Gait assistance: Min guard Gait Distance (Feet): 100 Feet (+100) Assistive device: Rollator (4 wheels) Gait Pattern/deviations: Step-through pattern, Decreased stride length, Trunk flexed Gait velocity: Decreased     General Gait Details: slow cautious gait with increased SOB during ambulation today with HR up to 136 before taking seated rest break after 100 ft ambulation   Stairs         General stair comments: pt declined trialing stairs today, did practice taking large steps over flat 10 in threshold on unit to imitate increased time in single limb stance, preference for the increased stance time to be on RLE   Wheelchair Mobility    Modified Rankin (Stroke Patients Only)       Balance Overall balance assessment: Needs assistance Sitting-balance support: Feet supported, No upper extremity supported Sitting balance-Leahy Scale: Good Sitting balance - Comments: sitting up in recliner without external support and no LOB, able to independently put recliner legs up and down   Standing balance support: Bilateral upper extremity supported, During functional activity Standing balance-Leahy Scale: Fair Standing balance comment: appears unstead due to UE shakes but manages dynamic standing balance well, especially with rollator repositioning during sit to stand transfers and  clothing mgmt before/after mobility                            Cognition Arousal/Alertness: Awake/alert Behavior During Therapy: Flat affect Overall Cognitive Status: Impaired/Different from baseline Area of Impairment: Problem solving, Following commands, Safety/judgement                       Following Commands: Follows one step commands with increased time Safety/Judgement: Decreased awareness of deficits   Problem Solving: Slow processing, Requires verbal cues, Requires tactile cues General Comments: pt in better spirits today, looking forward to performing mobility. continues to have strong preferences for line setup, did not believe needed a rest break during ambulation although demonstrating dyspnea and HR up to 136 bpm.        Exercises      General Comments General comments (skin integrity, edema, etc.): SpO2 >93% on 2L [place]; HR up to 136 bpm with mobility but did not sustain spike      Pertinent Vitals/Pain Pain Assessment Faces Pain Scale: Hurts a little bit Pain Location: low back Pain Descriptors / Indicators: Constant, Discomfort, Tender Pain Intervention(s): Monitored during session, Repositioned    Home Living                          Prior Function            PT Goals (current goals can now be found in the care plan section) Acute Rehab PT Goals Patient Stated Goal: get back home PT Goal Formulation: With patient Time For Goal Achievement: 06/07/22 Potential to Achieve Goals: Good Progress towards PT goals: Progressing toward goals (Goals updated - see care plan)    Frequency    Min 3X/week      PT Plan Current plan remains appropriate    Co-evaluation              AM-PAC PT "6 Clicks" Mobility   Outcome Measure  Help needed turning from your back to your side while in a flat bed without using bedrails?: A Little Help needed moving from lying on your back to sitting on the side of a flat bed without  using bedrails?: A Little Help needed moving to and from a bed to a chair (including a wheelchair)?: A Little Help needed standing up from a chair using your arms (e.g., wheelchair or bedside chair)?: A Little Help needed to walk in hospital room?: A Little Help needed climbing 3-5 steps with a railing? : A Little 6 Click Score: 18    End of Session Equipment Utilized During Treatment: Gait belt;Oxygen Activity Tolerance: Patient tolerated treatment well;Patient limited by fatigue Patient left: with family/visitor present;with call bell/phone within reach;in chair Nurse Communication: Mobility status PT Visit Diagnosis: Other abnormalities of gait and mobility (R26.89);Difficulty in walking, not elsewhere classified (R26.2);Muscle weakness (generalized) (M62.81)     Time: 2130-8657 PT Time Calculation (min) (ACUTE ONLY): 27 min  Charges:  $Therapeutic Exercise: 23-37 mins                     Alric Ran, SPT    [place] Havier Deeb 05/24/2022, 1:00 PM

## 2022-05-24 NOTE — Progress Notes (Addendum)
      [address]       [place] [address]             [phone]       15 Days Post-Op Procedure(s) (LRB): LAPAROSCOPIC JEJUNOSTOMY (N/A) ESOPHAGOGASTRODUODENOSCOPY (EGD) (N/A) VIDEO ASSISTED THORACOSCOPY (Right)  Subjective: Patient did ambulate yesterday. He has no specific complaint this am. Patient does not want swallow study yet;will discuss with [name]  Objective: Vital signs in last 24 hours: Temp:  [97.6 F (36.4 C)-98.2 F (36.8 C)] 98.2 F (36.8 C) (11/15 0358) Pulse Rate:  [86-109] 95 (11/15 0358) Cardiac Rhythm: Sinus tachycardia;Bundle branch block (11/14 1900) Resp:  [17-20] 18 (11/15 0358) BP: (125-160)/(72-108) 158/75 (11/15 0358) SpO2:  [94 %-96 %] 96 % (11/15 0358)     Intake/Output from previous day: 11/14 0701 - 11/15 0700 In: 30  Out: 1960 [Urine:1900; Drains:60]   Physical Exam:  Cardiovascular: RRR Pulmonary: Clear to auscultation bilaterally Abdomen: Soft, non tender, bowel sounds present. Extremities: No lower extremity edema. Tremors Wounds: Clean and dry.  No erythema or signs of infection. Drains: Serous output from right chest tube drain  Lab Results: CBC: Recent Labs    05/23/22 0320  WBC 3.8*  HGB 8.2*  HCT 28.2*  PLT 175    BMET:  Recent Labs    05/23/22 0320 05/24/22 0440  NA 157* 159*  K 3.8 3.6  CL 125* 128*  CO2 27 25  GLUCOSE 129* 139*  BUN 49* 46*  CREATININE 2.33* 2.10*  CALCIUM 11.0* 10.2     PT/INR: No results for input(s): "LABPROT", "INR" in the last 72 hours. ABG:  INR: Will add last result for INR, ABG once components are confirmed Will add last 4 CBG results once components are confirmed  Assessment/Plan:  1. CV - First degree heart block.  On Clonidine patch 0.1 mg Hydralazine 20 mg IV tid.  2.  Pulmonary - On room air. Right JP drain with serous output and 60 cc recorded for 12 hours.  Encourage incentive spirometer 3. Anemia- H and H this am decreased to 8.2  and 28.2 4. GI-NPO. CBGs 160/108/140.  Appreciate nutrition's recommendations. 5. ID-on Zosyn (possible aspiration PNA). Diflucan stopped 11/13. Continue Zosyn as had esophageal leak on this admission.  6. On Lovenox for DVT 7. Hypernatremia, hypercalcemia resolved-Sodium up to 159 this am. Calcium decreased to 10.2. He has been given IV saline, Calcitonin, and Zolendonic acid given. Possibly related to dehydration. Await Hydroxy vitamin D result. Will continue free water every 2 hours via J tube and IVF at 75 ml/hr. Will change IVF to 0.45% saline instead of 0.9% 8. AKI-Creatinine this am slightly decreased to 2.33. Had previously been given several doses of IV Lasix. Monitor and avoid nephrotoxic agents. 9. Deconditioned-continue OT/PT. Home PT and OT arranged 10. Leukopenia-Last WBC 3800  Keith M Zimmerman PA-C 05/24/2022,6:59 AM     Agree with above On tube feeds IS, ambulation  Keith Moreno

## 2022-05-24 NOTE — Plan of Care (Signed)
  Problem: Education: Goal: Knowledge of General Education information will improve Description: Including pain rating scale, medication(s)/side effects and non-pharmacologic comfort measures Outcome: Progressing   Problem: Health Behavior/Discharge Planning: Goal: Ability to manage health-related needs will improve Outcome: Progressing   Problem: Clinical Measurements: Goal: Ability to maintain clinical measurements within normal limits will improve Outcome: Progressing Goal: Will remain free from infection Outcome: Progressing Goal: Diagnostic test results will improve Outcome: Progressing Goal: Respiratory complications will improve Outcome: Progressing Goal: Cardiovascular complication will be avoided Outcome: Progressing   Problem: Activity: Goal: Risk for activity intolerance will decrease Outcome: Progressing   Problem: Nutrition: Goal: Adequate nutrition will be maintained Outcome: Progressing   Problem: Coping: Goal: Level of anxiety will decrease Outcome: Progressing   Problem: Elimination: Goal: Will not experience complications related to bowel motility Outcome: Progressing Goal: Will not experience complications related to urinary retention Outcome: Progressing   Problem: Pain Managment: Goal: General experience of comfort will improve Outcome: Progressing   Problem: Safety: Goal: Ability to remain free from injury will improve Outcome: Progressing   Problem: Skin Integrity: Goal: Risk for impaired skin integrity will decrease Outcome: Progressing   Problem: Education: Goal: Knowledge of the prescribed therapeutic regimen will improve Outcome: Progressing   Problem: Bowel/Gastric: Goal: Gastrointestinal status for postoperative course will improve Outcome: Progressing   Problem: Nutritional: Goal: Ability to achieve adequate nutritional intake will improve Outcome: Progressing   Problem: Clinical Measurements: Goal: Postoperative  complications will be avoided or minimized Outcome: Progressing   Problem: Skin Integrity: Goal: Demonstration of wound healing without infection will improve Outcome: Progressing

## 2022-05-24 NOTE — Telephone Encounter (Signed)
Called patient on mobile telephone as his nutrition follow up was rescheduled since he was in the hospital.  He reports he does have an outpatient RD at the [facility] but he doesn't know the contact information.  He will get all his supplies ordered through the [place].  I text my contact information and assured him there would be continuity of care following discharge as a resource to he and his wife.  [name], RDN, LDN Registered Dietitian, [place] Part Time Remote (Usual office hours: Tuesday-Thursday) Mobile: [phone] Remote Office: [phone]

## 2022-05-24 NOTE — Plan of Care (Signed)
  Problem: Education: Goal: Knowledge of General Education information will improve Description: Including pain rating scale, medication(s)/side effects and non-pharmacologic comfort measures Outcome: Progressing   Problem: Health Behavior/Discharge Planning: Goal: Ability to manage health-related needs will improve Outcome: Progressing   Problem: Clinical Measurements: Goal: Ability to maintain clinical measurements within normal limits will improve Outcome: Progressing Goal: Will remain free from infection Outcome: Progressing Goal: Diagnostic test results will improve Outcome: Progressing Goal: Respiratory complications will improve Outcome: Progressing Goal: Cardiovascular complication will be avoided Outcome: Progressing   Problem: Activity: Goal: Risk for activity intolerance will decrease Outcome: Progressing   Problem: Nutrition: Goal: Adequate nutrition will be maintained Outcome: Progressing   Problem: Coping: Goal: Level of anxiety will decrease Outcome: Progressing   Problem: Elimination: Goal: Will not experience complications related to bowel motility Outcome: Progressing Goal: Will not experience complications related to urinary retention Outcome: Progressing   Problem: Pain Managment: Goal: General experience of comfort will improve Outcome: Progressing   Problem: Safety: Goal: Ability to remain free from injury will improve Outcome: Progressing   Problem: Skin Integrity: Goal: Risk for impaired skin integrity will decrease Outcome: Progressing   Problem: Education: Goal: Knowledge of the prescribed therapeutic regimen will improve Outcome: Progressing   Problem: Bowel/Gastric: Goal: Gastrointestinal status for postoperative course will improve Outcome: Progressing   Problem: Nutritional: Goal: Ability to achieve adequate nutritional intake will improve Outcome: Progressing   Problem: Clinical Measurements: Goal: Postoperative  complications will be avoided or minimized Outcome: Progressing   Problem: Respiratory: Goal: Ability to maintain a clear airway will improve Outcome: Progressing   Problem: Skin Integrity: Goal: Demonstration of wound healing without infection will improve Outcome: Progressing

## 2022-05-24 NOTE — Plan of Care (Signed)
  Problem: Education: Goal: Knowledge of General Education information will improve Description: Including pain rating scale, medication(s)/side effects and non-pharmacologic comfort measures Outcome: Progressing   Problem: Health Behavior/Discharge Planning: Goal: Ability to manage health-related needs will improve Outcome: Progressing   Problem: Clinical Measurements: Goal: Ability to maintain clinical measurements within normal limits will improve Outcome: Progressing Goal: Will remain free from infection Outcome: Progressing Goal: Diagnostic test results will improve Outcome: Progressing   Problem: Clinical Measurements: Goal: Will remain free from infection Outcome: Progressing

## 2022-05-25 LAB — BASIC METABOLIC PANEL
Anion gap: 5 (ref 5–15)
BUN: 39 mg/dL — ABNORMAL HIGH (ref 8–23)
CO2: 25 mmol/L (ref 22–32)
Calcium: 9.7 mg/dL (ref 8.9–10.3)
Chloride: 126 mmol/L — ABNORMAL HIGH (ref 98–111)
Creatinine, Ser: 1.81 mg/dL — ABNORMAL HIGH (ref 0.61–1.24)
GFR, Estimated: 42 mL/min — ABNORMAL LOW (ref >60)
Glucose, Bld: 155 mg/dL — ABNORMAL HIGH (ref 70–99)
Potassium: 3.6 mmol/L (ref 3.5–5.1)
Sodium: 156 mmol/L — ABNORMAL HIGH (ref 135–145)

## 2022-05-25 LAB — GLUCOSE, CAPILLARY
Glucose-Capillary: 124 mg/dL — ABNORMAL HIGH (ref 70–99)
Glucose-Capillary: 138 mg/dL — ABNORMAL HIGH (ref 70–99)
Glucose-Capillary: 101 mg/dL — ABNORMAL HIGH (ref 70–99)
Glucose-Capillary: 92 mg/dL (ref 70–99)
Glucose-Capillary: 156 mg/dL — ABNORMAL HIGH (ref 70–99)
Glucose-Capillary: 103 mg/dL — ABNORMAL HIGH (ref 70–99)

## 2022-05-25 MED ORDER — ZINC OXIDE 40 % EX OINT
TOPICAL_OINTMENT | Freq: Two times a day (BID) | CUTANEOUS | Status: DC
  Filled 2022-05-25 (×2): qty 57

## 2022-05-25 NOTE — Progress Notes (Signed)
Physical Therapy Treatment Patient Details Name: Keith Moreno MRN: [id number] DOB: [birth year] Today's Date: 05/25/2022   History of Present Illness Pt is 61 y/o male [facility] with weakness, dizziness, difficulty swallowing, admitted for hypercalcemia. S/p esophagogastroscopy, VATS 10/31. Of note, pt with recent prolonged hospitalization (9/27-10/20) after esophagectomy with placement of J-tube. Other PMH includes esophageal CA, HTN, sacral wound.    PT Comments    Pt received in recliner, agreeable to therapy session with good participation and tolerance for longer household distance gait trials and stair negotiation. Pt needing up to min guard to perform stairs and performs gait/transfers at Supervision to modI level. Pt has nearly met all goals, may be able to update goals or DC acute PT in 1-2 more sessions pending progress; pt also working well with mobility specialists. Pt continues to benefit from PT services to progress toward functional mobility goals.    Recommendations for follow up therapy are one component of a multi-disciplinary discharge planning process, led by the attending physician.  Recommendations may be updated based on patient status, additional functional criteria and insurance authorization.  Follow Up Recommendations  Home health PT     Assistance Recommended at Discharge Intermittent Supervision/Assistance  Patient can return home with the following A little help with bathing/dressing/bathroom;Assistance with cooking/housework;Assist for transportation;Help with stairs or ramp for entrance;A little help with walking and/or transfers   Equipment Recommendations  None recommended by PT    Recommendations for Other Services       Precautions / Restrictions Precautions Precautions: Fall;Other (comment) Precaution Comments: JP drain; J-tube; intermittent shakiness Restrictions Weight Bearing Restrictions: No     Mobility  Bed  Mobility               General bed mobility comments: in chair upon arrival, pt defers return to bed until NT fixes his lines for him.    Transfers Overall transfer level: Needs assistance Equipment used: Rollator (4 wheels), Rolling walker (2 wheels) Transfers: Sit to/from Stand Sit to Stand: Modified independent (Device/Increase time)           General transfer comment: from chair and rollator heights; good safety awareness with lines and brakes.    Ambulation/Gait Ambulation/Gait assistance: Supervision Gait Distance (Feet): 170 Feet (125ft x2, 114ft with seated breaks between) Assistive device: Rollator (4 wheels) Gait Pattern/deviations: Step-through pattern, Decreased stride length, Trunk flexed Gait velocity: Decreased     General Gait Details: DOE to 3/4, pt able to indicate when he needs seated breaks and good line awareness; PTA assisting with IV pole/line mgmt; nearly modI   Stairs Stairs: Yes Stairs assistance: Min guard Stair Management: Step to pattern, Forwards, One rail Right, With walker Number of Stairs: 2 (x2 trials) General stair comments: cues for step sequencing/energy conservation, pt able to step up onto 7" platform with RW to simulate step-up x2 reps, then during hallway gait trial, pt able to perform x2 steps in regular stairwell with R rail pt semi-sideways facing rail for stability per home technique; HR to ~130 bpm, pt aware and took seated break after without needing reminder from PTA to rest.   Wheelchair Mobility    Modified Rankin (Stroke Patients Only)       Balance Overall balance assessment: Mild deficits observed, not formally tested Sitting-balance support: Feet supported, No upper extremity supported Sitting balance-Leahy Scale: Good     Standing balance support: Bilateral upper extremity supported, During functional activity Standing balance-Leahy Scale: Fair  Cognition  Arousal/Alertness: Awake/alert Behavior During Therapy: WFL for tasks assessed/performed Overall Cognitive Status: Impaired/Different from baseline Area of Impairment: Problem solving, Following commands, Safety/judgement                       Following Commands: Follows one step commands with increased time Safety/Judgement: Decreased awareness of deficits   Problem Solving: Requires verbal cues General Comments: Self-directed, pt self-monitoring for sx fatigue and able to indicate when he needs seated break        Exercises      General Comments General comments (skin integrity, edema, etc.): BP 151/94 (112), SpO2 92% and above on RA with exertion, HR 107-130 bpm with exertion. Monitor briefly reading Vtach ~2 seconds during gait trial but anticipate this was artifact as pt cords were shaking and pt denies symptoms.      Pertinent Vitals/Pain Pain Assessment Pain Assessment: Faces Faces Pain Scale: Hurts a little bit Pain Location: low back Pain Descriptors / Indicators: Discomfort Pain Intervention(s): Monitored during session     PT Goals (current goals can now be found in the care plan section) Acute Rehab PT Goals Patient Stated Goal: get back home PT Goal Formulation: With patient Time For Goal Achievement: 06/07/22 Progress towards PT goals: Progressing toward goals    Frequency    Min 3X/week      PT Plan Current plan remains appropriate       AM-PAC PT "6 Clicks" Mobility   Outcome Measure  Help needed turning from your back to your side while in a flat bed without using bedrails?: None Help needed moving from lying on your back to sitting on the side of a flat bed without using bedrails?: A Little Help needed moving to and from a bed to a chair (including a wheelchair)?: A Little Help needed standing up from a chair using your arms (e.g., wheelchair or bedside chair)?: None Help needed to walk in hospital room?: A Little Help needed  climbing 3-5 steps with a railing? : A Little 6 Click Score: 20    End of Session   Activity Tolerance: Patient tolerated treatment well Patient left: in chair;with call bell/phone within reach;with nursing/sitter in room (NT entering room to assist pt with return to bed) Nurse Communication: Mobility status PT Visit Diagnosis: Other abnormalities of gait and mobility (R26.89);Difficulty in walking, not elsewhere classified (R26.2);Muscle weakness (generalized) (M62.81)     Time: 1191-4782 PT Time Calculation (min) (ACUTE ONLY): 28 min  Charges:  $Gait Training: 23-37 mins                     Kendall Arnell P., PTA Acute Rehabilitation Services Secure Chat Preferred 9a-5:30pm Office: [phone]    Dorathy Kinsman [facility] 05/25/2022, 5:09 PM

## 2022-05-25 NOTE — Progress Notes (Signed)
Mobility Specialist Progress Note    05/25/22 1145  Mobility  Activity Ambulated with assistance in hallway  Level of Assistance Standby assist, set-up cues, supervision of patient - no hands on  Assistive Device Four wheel walker  Distance Ambulated (ft) 420 ft  Activity Response Tolerated well  Mobility Referral Yes  $Mobility charge 1 Mobility   Pre-Mobility: 83 HR, 94% SpO2 on RA During Mobility: >/=93% SpO2 on RA Post-Mobility: 100 HR, 174/85 (105) BP, 95% SpO2  Pt received in chair and agreeable. No complaints on walk. Returned to chair with call bell in reach.    Hildred Alamin Mobility Specialist  Please Psychologist, sport and exercise or Rehab Office at [phone]

## 2022-05-25 NOTE — TOC Progression Note (Signed)
Transition of Care (TOC) - Progression Note  Emailed [place] regarding home tube feedings. Received a reply that Vinnie Level at [place] is following patient and she is off today . No update available   Patient Details  Name: Keith Moreno MRN: [id number] Date of Birth: 1961-05-22  Transition of Care [facility]) CM/SW Contact  Decklin Weddington, Edson Snowball, RN Phone Number: 05/25/2022, 1:59 PM  Clinical Narrative:       Expected Discharge Plan: [place] Barriers to Discharge: Continued Medical Work up  Expected Discharge Plan and Services Expected Discharge Plan: [place]   Discharge Planning Services: CM Consult Post Acute Care Choice: [place] arrangements for the past 2 months: Single Family Home                 DME Arranged:  (see note)         HH Arranged: RN, PT, OT HH Agency: [facility] Date [facility] Agency Contacted: 05/08/22 Time [place]: 47 Representative spoke with at [place]: cory   Social Determinants of Health (SDOH) Interventions    Readmission Risk Interventions     No data to display

## 2022-05-25 NOTE — Consult Note (Signed)
[facility] Nurse Consult Note: Patient receiving care in [facility] 2C2.  Reason for Consult: "Stage 3 on right butt cheek" Wound type: The scattered, superifical areas on the right buttock areas of the intergluteal cleft are NOT pressure injuries. These are related to excessive moisture (MASD-ITD) without fungal overgrowth. Pressure Injury POA: Yes/No/NA Measurement: na Wound bed: pnk Drainage (amount, consistency, odor) none Periwound: intact Dressing procedure/placement/frequency: Cleanse intergluteal cleft/buttock area.  Pat dry. Place Desitin on the scattered wounds on the RIGHT buttock area.  Monitor the wound area(s) for worsening of condition such as: Signs/symptoms of infection,  Increase in size,  Development of or worsening of odor, Development of pain, or increased pain at the affected locations.  Notify the medical team if any of these develop.  Thank you for the consult.  Discussed plan of care with the patient and bedside nurse.  [facility] nurse will not follow at this time.  Please re-consult the [place] team if needed.  Val Riles, RN, MSN, CWOCN, CNS-BC, pager [phone]

## 2022-05-25 NOTE — Plan of Care (Signed)
  Problem: Education: Goal: Knowledge of General Education information will improve Description: Including pain rating scale, medication(s)/side effects and non-pharmacologic comfort measures Outcome: Progressing   Problem: Health Behavior/Discharge Planning: Goal: Ability to manage health-related needs will improve Outcome: Progressing   Problem: Clinical Measurements: Goal: Ability to maintain clinical measurements within normal limits will improve Outcome: Progressing Goal: Will remain free from infection Outcome: Progressing Goal: Diagnostic test results will improve Outcome: Progressing Goal: Respiratory complications will improve Outcome: Progressing Goal: Cardiovascular complication will be avoided Outcome: Progressing   Problem: Activity: Goal: Risk for activity intolerance will decrease Outcome: Progressing   Problem: Nutrition: Goal: Adequate nutrition will be maintained Outcome: Progressing   Problem: Coping: Goal: Level of anxiety will decrease Outcome: Progressing   Problem: Elimination: Goal: Will not experience complications related to bowel motility Outcome: Progressing Goal: Will not experience complications related to urinary retention Outcome: Progressing   Problem: Pain Managment: Goal: General experience of comfort will improve Outcome: Progressing   Problem: Safety: Goal: Ability to remain free from injury will improve Outcome: Progressing   Problem: Skin Integrity: Goal: Risk for impaired skin integrity will decrease Outcome: Progressing   Problem: Education: Goal: Knowledge of the prescribed therapeutic regimen will improve Outcome: Progressing   Problem: Bowel/Gastric: Goal: Gastrointestinal status for postoperative course will improve Outcome: Progressing   Problem: Nutritional: Goal: Ability to achieve adequate nutritional intake will improve Outcome: Progressing   Problem: Clinical Measurements: Goal: Postoperative  complications will be avoided or minimized Outcome: Progressing   Problem: Respiratory: Goal: Ability to maintain a clear airway will improve Outcome: Progressing   Problem: Skin Integrity: Goal: Demonstration of wound healing without infection will improve Outcome: Progressing

## 2022-05-25 NOTE — Progress Notes (Addendum)
      [address]       [place] [address]             [phone]       16 Days Post-Op Procedure(s) (LRB): LAPAROSCOPIC JEJUNOSTOMY (N/A) ESOPHAGOGASTRODUODENOSCOPY (EGD) (N/A) VIDEO ASSISTED THORACOSCOPY (Right)  Subjective: Patient had a fairly good day yesterday and he was able to sleep last night.  Objective: Vital signs in last 24 hours: Temp:  [97.9 F (36.6 C)-98.6 F (37 C)] 98.3 F (36.8 C) (11/16 0358) Pulse Rate:  [79-100] 79 (11/16 0358) Cardiac Rhythm: Normal sinus rhythm (11/15 1942) Resp:  [18-27] 20 (11/16 0358) BP: (151-167)/(70-94) 157/75 (11/16 0358) SpO2:  [92 %-95 %] 95 % (11/16 0358)     Intake/Output from previous day: 11/15 0701 - 11/16 0700 In: 5120.1 [P.O.:60; I.V.:1388.1; JG/OT:1572.6; IV Piggyback:324.9] Out: 3775 [Urine:3775]   Physical Exam:  Cardiovascular: RRR Pulmonary: Clear to auscultation bilaterally Abdomen: Soft, non tender, bowel sounds present. Extremities: No lower extremity edema. Tremors Wounds: Clean and dry.  No erythema or signs of infection. Drains: Serous output from right chest tube drain  Lab Results: CBC: Recent Labs    05/23/22 0320  WBC 3.8*  HGB 8.2*  HCT 28.2*  PLT 175    BMET:  Recent Labs    05/24/22 0440 05/25/22 0435  NA 159* 156*  K 3.6 3.6  CL 128* 126*  CO2 25 25  GLUCOSE 139* 155*  BUN 46* 39*  CREATININE 2.10* 1.81*  CALCIUM 10.2 9.7     PT/INR: No results for input(s): "LABPROT", "INR" in the last 72 hours. ABG:  INR: Will add last result for INR, ABG once components are confirmed Will add last 4 CBG results once components are confirmed  Assessment/Plan:  1. CV - First degree heart block.  On Clonidine patch 0.1 mg Hydralazine 20 mg IV tid.  2.  Pulmonary - On room air. Right JP drain with serous output and no recorded output ? Encourage incentive spirometer 3. Anemia- Last H and H  8.2 and 28.2 4. GI-NPO. CBGs 152/94/124.  Appreciate nutrition's  recommendations. ? Swallow study Monday 5. ID-on Zosyn (possible aspiration PNA). Diflucan stopped 11/13. Continue Zosyn as had esophageal leak on this admission.  6. On Lovenox for DVT 7. Hypernatremia, -Sodium decreased to 156 this am. Will continue free water every 2 hours via J tube and IVF 0.45% saline at 75 ml/hr.  8. Hypercalcemia resolved-Calcium decreased to 8.  He had been given IV saline, Calcitonin, and Zolendonic acid. Likely related to dehydration. Await Hydroxy vitamin D result.  9. AKI-Creatinine this am decreased to 1.81 Had previously been given several doses of IV Lasix. Continue to monitor and avoid nephrotoxic agents. 10. Deconditioned-continue OT/PT. Home PT and OT arranged   Lou Loewe Liston Alba PA-C 05/25/2022,6:54 AM

## 2022-05-26 LAB — BASIC METABOLIC PANEL
Anion gap: 9 (ref 5–15)
BUN: 36 mg/dL — ABNORMAL HIGH (ref 8–23)
CO2: 23 mmol/L (ref 22–32)
Calcium: 9.1 mg/dL (ref 8.9–10.3)
Chloride: 122 mmol/L — ABNORMAL HIGH (ref 98–111)
Creatinine, Ser: 1.58 mg/dL — ABNORMAL HIGH (ref 0.61–1.24)
GFR, Estimated: 49 mL/min — ABNORMAL LOW (ref >60)
Glucose, Bld: 154 mg/dL — ABNORMAL HIGH (ref 70–99)
Potassium: 3.6 mmol/L (ref 3.5–5.1)
Sodium: 154 mmol/L — ABNORMAL HIGH (ref 135–145)

## 2022-05-26 LAB — GLUCOSE, CAPILLARY
Glucose-Capillary: 133 mg/dL — ABNORMAL HIGH (ref 70–99)
Glucose-Capillary: 136 mg/dL — ABNORMAL HIGH (ref 70–99)
Glucose-Capillary: 81 mg/dL (ref 70–99)
Glucose-Capillary: 97 mg/dL (ref 70–99)
Glucose-Capillary: 159 mg/dL — ABNORMAL HIGH (ref 70–99)
Glucose-Capillary: 101 mg/dL — ABNORMAL HIGH (ref 70–99)

## 2022-05-26 LAB — ALBUMIN: Albumin: 2.4 g/dL — ABNORMAL LOW (ref 3.5–5.0)

## 2022-05-26 MED ORDER — POTASSIUM CHLORIDE 20 MEQ PO PACK
20.0000 meq | PACK | Freq: Every day | ORAL | Status: DC
  Administered 2022-05-26 – 2022-06-05 (×11): 20 meq
  Filled 2022-05-26 (×11): qty 1

## 2022-05-26 MED ORDER — FREE WATER
200.0000 mL | Status: DC
  Administered 2022-05-26 – 2022-05-29 (×32): 200 mL

## 2022-05-26 NOTE — Progress Notes (Addendum)
Brief Nutrition Note:   Remains NPO, noted possible Swallow on Monday  Tolerating Osmolite 1.5 at 95 ml/hr x 18 hours via J-tube  Hypernatremia improving but persists; free water flush of 150 mL q 2 hours and 1/2 NS at 75 ml/hr in addition to flushes given with meds per tube and free water provided by TF. Noted IVF changed from NS to 1/2 NS on 11/15  Tremors improving some, hypercalcemia improving. Corrected calcium 10.4 (albumin 2.7, serum calcium 9.1)  Interventions: Continue Osmolite 1.5 at 95 ml/hr x 14 hours Goal regimen provides 2565 kcals, 107 g of protein and 1300 mL of free water  Pro-Source TF20 60 mL BID, each packet provides 20 g of protein and 80 kcals Pro-Source TF currently ordered for additional protein. but can be discontinued at discharge.  Increase free water flush to 200 mL q 2 hours; if pt does not tolerate, recommend reducing back to 150 mL q 2 hours Question if pt would benefit from changing 1/2 NS to D5 infusion until hypernatremia resolves if increased   [name] MS, RDN, LDN, CNSC Registered Dietitian 3 Clinical Nutrition RD Pager and On-Call Pager Number Located in [place]

## 2022-05-26 NOTE — Progress Notes (Addendum)
      [address]       [place] [address]             [phone]       17 Days Post-Op Procedure(s) (LRB): LAPAROSCOPIC JEJUNOSTOMY (N/A) ESOPHAGOGASTRODUODENOSCOPY (EGD) (N/A) VIDEO ASSISTED THORACOSCOPY (Right)  Subjective: Patient just finished washing up (with wife's assistance) this am and is sitting in chair. He was able to sleep some. He state tremors are now intmerittent  Objective: Vital signs in last 24 hours: Temp:  [98 F (36.7 C)-98.2 F (36.8 C)] 98.2 F (36.8 C) (11/17 0745) Pulse Rate:  [81-110] 84 (11/17 0745) Cardiac Rhythm: Normal sinus rhythm;Bundle branch block (11/17 0700) Resp:  [16-24] 16 (11/17 0745) BP: (148-174)/(69-96) 153/83 (11/17 0745) SpO2:  [92 %-96 %] 94 % (11/17 0745)     Intake/Output from previous day: 11/16 0701 - 11/17 0700 In: 8379.8 [I.V.:1630.3; LK/GM:0102; IV Piggyback:167.5] Out: 2507.5 [Urine:2500; Drains:7.5]   Physical Exam:  Cardiovascular: RRR Pulmonary: Clear to auscultation bilaterally Abdomen: Soft, non tender, bowel sounds present. Extremities: No lower extremity edema. Mild arm tremor this am Wounds: Clean and dry.  No erythema or signs of infection. Drains: Serous output from right chest tube drain  Lab Results: CBC: No results for input(s): "WBC", "HGB", "HCT", "PLT" in the last 72 hours.  BMET:  Recent Labs    05/25/22 0435 05/26/22 0440  NA 156* 154*  K 3.6 3.6  CL 126* 122*  CO2 25 23  GLUCOSE 155* 154*  BUN 39* 36*  CREATININE 1.81* 1.58*  CALCIUM 9.7 9.1     PT/INR: No results for input(s): "LABPROT", "INR" in the last 72 hours. ABG:  INR: Will add last result for INR, ABG once components are confirmed Will add last 4 CBG results once components are confirmed  Assessment/Plan:  1. CV - First degree heart block.  SBP mostly in 150's. On Clonidine patch 0.1 mg Hydralazine 20 mg IV tid.  2.  Pulmonary - On room air. Right JP drain with serous output and no recorded  output ? Encourage incentive spirometer 3. Anemia- Last H and H  8.2 and 28.2 4. GI-NPO. CBGs 103/133/136.  Appreciate nutrition's recommendations. ? Swallow study Monday 5. ID-on Zosyn (possible aspiration PNA). Diflucan stopped 11/13. Continue Zosyn as had esophageal leak on this admission.  6. On Lovenox for DVT 7. Hypernatremia, -Sodium decreased to 154 this am. Will continue free water every 2 hours via J tube and IVF 0.45% saline at 75 ml/hr.  8. Hypercalcemia resolved-Calcium decreased to 9.1  Await Hydroxy vitamin D result.  9. AKI-Creatinine this am decreased to 1.58.  Continue to monitor and avoid nephrotoxic agents. 10. Severe protein malnutrition-Albumin 2.4 10. Deconditioned-continue OT/PT. Home PT and OT arranged   Nani Skillern PA-C 05/26/2022,7:58 AM      Agree with above Nutrition labs on Monday Swallow on Monday [place]

## 2022-05-26 NOTE — TOC CM/SW Note (Addendum)
Called Suzanne dietitian at [facility] regarding tube feeds for home, [phone] ext [phone]. Spoke to Jonelle Sidle, [facility] is off today . NCM explained calling regarding home tube feeds spoke to [place] at [facility] yesterday and was told to call Vinnie Level today. Tiffany transferred to triage nurse , who transferred NCM to [place] 1444 left message.   [facility]ed [facility] social worker [place] [phone] ext [phone] to follow up on bariatric bedside commode and hospital bed , left message .  Awaiting call backs    Labette with [facility] returned call. [facility] Osmolite yesterday , [facility] will mail osmolite to patient's address. Drue Dun unsure when it will be mailed and how long it will take. Drue Dun will call pharmacy and then call [facility] , and then call NCM back.    [place] returned call, she has spoken to pharmacy and patient and [facility] , [place] will [employer] them 10 day supply of Osmolite to receive tomorrow at the latest Monday , then will receive month supply in a week.   Virtual  visit 11/21 at 1pm by OT for assessment for hospital bed . If approved for hospital bed it will be delivered after that.   Bariatric bedside commode has been sent  .  Patient and [facility] aware of all of above. They did receive a box yesterday, unsure if bedside commode is in the box, their grandson is going to open box today .  Patient currently on oxygen. Patient states he needs oxygen when he sleeps , he wakes up gasping for breath. NCM discussed this with PA last week whop ordered overnight sleep oximetry test and passed. Patient reported he did not sleep that night due to being attached to monitor , so it was not accurate. Paged Tacy Dura PA . Per note 05/17/22 he does not need home oxygen because he does not meet criteria . Discussed with PA he will need to follow up with [facility] OP regarding [facility]. He does not desat here to order home oxygen . Patient aware

## 2022-05-26 NOTE — Plan of Care (Signed)
  Problem: Education: Goal: Knowledge of General Education information will improve Description: Including pain rating scale, medication(s)/side effects and non-pharmacologic comfort measures Outcome: Progressing   Problem: Health Behavior/Discharge Planning: Goal: Ability to manage health-related needs will improve Outcome: Progressing   Problem: Clinical Measurements: Goal: Ability to maintain clinical measurements within normal limits will improve Outcome: Progressing Goal: Will remain free from infection Outcome: Progressing Goal: Diagnostic test results will improve Outcome: Progressing Goal: Respiratory complications will improve Outcome: Progressing Goal: Cardiovascular complication will be avoided Outcome: Progressing   Problem: Activity: Goal: Risk for activity intolerance will decrease Outcome: Progressing   Problem: Nutrition: Goal: Adequate nutrition will be maintained Outcome: Progressing   Problem: Coping: Goal: Level of anxiety will decrease Outcome: Progressing   Problem: Elimination: Goal: Will not experience complications related to bowel motility Outcome: Progressing Goal: Will not experience complications related to urinary retention Outcome: Progressing   Problem: Pain Managment: Goal: General experience of comfort will improve Outcome: Progressing   Problem: Safety: Goal: Ability to remain free from injury will improve Outcome: Progressing   Problem: Skin Integrity: Goal: Risk for impaired skin integrity will decrease Outcome: Progressing   Problem: Education: Goal: Knowledge of the prescribed therapeutic regimen will improve Outcome: Progressing   Problem: Bowel/Gastric: Goal: Gastrointestinal status for postoperative course will improve Outcome: Progressing   Problem: Nutritional: Goal: Ability to achieve adequate nutritional intake will improve Outcome: Progressing   Problem: Clinical Measurements: Goal: Postoperative  complications will be avoided or minimized Outcome: Progressing   Problem: Respiratory: Goal: Ability to maintain a clear airway will improve Outcome: Progressing   Problem: Skin Integrity: Goal: Demonstration of wound healing without infection will improve Outcome: Progressing

## 2022-05-26 NOTE — Progress Notes (Signed)
Mobility Specialist Progress Note    05/26/22 1117  Mobility  Activity Ambulated with assistance in hallway  Level of Assistance Standby assist, set-up cues, supervision of patient - no hands on  Assistive Device Four wheel walker  Distance Ambulated (ft) 500 ft (200+200+100)  Activity Response Tolerated well  Mobility Referral Yes  $Mobility charge 1 Mobility   Pre-Mobility: 97 HR, 184/88 (114) BP, 95% SpO2 During Mobility: 125 HR, >/=90% SpO2 Post-Mobility: 111 HR, 95% SpO2  Pt received in chair and agreeable. No complaints on walk. Took x2 seated rest breaks. Encouraged pursed lip breathing. Tolerated on RA. Returned to chair with call bell in reach.    Hildred Alamin Mobility Specialist  Please Psychologist, sport and exercise or Rehab Office at [phone]

## 2022-05-27 LAB — BASIC METABOLIC PANEL
Anion gap: 8 (ref 5–15)
BUN: 30 mg/dL — ABNORMAL HIGH (ref 8–23)
CO2: 22 mmol/L (ref 22–32)
Calcium: 8.8 mg/dL — ABNORMAL LOW (ref 8.9–10.3)
Chloride: 120 mmol/L — ABNORMAL HIGH (ref 98–111)
Creatinine, Ser: 1.39 mg/dL — ABNORMAL HIGH (ref 0.61–1.24)
GFR, Estimated: 58 mL/min — ABNORMAL LOW (ref >60)
Glucose, Bld: 167 mg/dL — ABNORMAL HIGH (ref 70–99)
Potassium: 3.7 mmol/L (ref 3.5–5.1)
Sodium: 150 mmol/L — ABNORMAL HIGH (ref 135–145)

## 2022-05-27 LAB — GLUCOSE, CAPILLARY
Glucose-Capillary: 135 mg/dL — ABNORMAL HIGH (ref 70–99)
Glucose-Capillary: 155 mg/dL — ABNORMAL HIGH (ref 70–99)
Glucose-Capillary: 117 mg/dL — ABNORMAL HIGH (ref 70–99)
Glucose-Capillary: 91 mg/dL (ref 70–99)
Glucose-Capillary: 146 mg/dL — ABNORMAL HIGH (ref 70–99)

## 2022-05-27 LAB — 25-HYDROXY VITAMIN D LCMS D2+D3
25-Hydroxy, Vitamin D-2: <1 ng/mL
25-Hydroxy, Vitamin D-3: 121 ng/mL
25-Hydroxy, Vitamin D: 121 ng/mL — ABNORMAL HIGH

## 2022-05-27 NOTE — Progress Notes (Signed)
     [address]       [place] [address]             [phone]       No events  Vitals:   05/27/22 0308 05/27/22 0745  BP: (!) 142/72 (!) 155/77  Pulse: 88 89  Resp: 18 (!) 22  Temp: 98.2 F (36.8 C) 98.5 F (36.9 C)  SpO2: 96% 96%   Alert NAD Sinus EWOB ND   Intake/Output Summary (Last 24 hours) at 05/27/2022 0955 Last data filed at 05/27/2022 0514 Gross per 24 hour  Intake 3882.5 ml  Output 1435 ml  Net 2447.5 ml   61yo s/p robotic Ivor Lewis, with anastomotic leak.  Covered by stent Will plan for esophagram on Monday Will check nutrition labs Potential home next week  Euan Wandler O Shannell Mikkelsen

## 2022-05-27 NOTE — Plan of Care (Signed)

## 2022-05-27 NOTE — Progress Notes (Signed)
05/27/22 1200  Mobility  Activity Ambulated with assistance in room  Level of Assistance Contact guard assist, steadying assist  Assistive Device Four wheel walker  Distance Ambulated (ft) 5 ft  Activity Response Tolerated fair  Mobility Referral Yes  $Mobility charge 1 Mobility   Mobility Specialist Progress Note  Pre-Mobility: 91 HR During Mobility: 107 HR Post-Mobility: 92 HR  Pt was in chair and agreeable. Upon setting up, Pt stated that he felt like his heart was racing. RN notified and stating to walk with him if he starts to feel better. Pt stated he was feeling better but after standing and taking a few steps, he felt like his heart was racing again. Returned to chair w/ all needs met and call bell in reach. RN notified.     Mobility Specialist   

## 2022-05-28 LAB — GLUCOSE, CAPILLARY
Glucose-Capillary: 100 mg/dL — ABNORMAL HIGH (ref 70–99)
Glucose-Capillary: 114 mg/dL — ABNORMAL HIGH (ref 70–99)
Glucose-Capillary: 123 mg/dL — ABNORMAL HIGH (ref 70–99)
Glucose-Capillary: 131 mg/dL — ABNORMAL HIGH (ref 70–99)
Glucose-Capillary: 132 mg/dL — ABNORMAL HIGH (ref 70–99)
Glucose-Capillary: 82 mg/dL (ref 70–99)
Glucose-Capillary: 83 mg/dL (ref 70–99)
Glucose-Capillary: 89 mg/dL (ref 70–99)

## 2022-05-28 NOTE — Progress Notes (Addendum)
      [address]       [place] [address]             [phone]       19 Days Post-Op Procedure(s) (LRB): LAPAROSCOPIC JEJUNOSTOMY (N/A) ESOPHAGOGASTRODUODENOSCOPY (EGD) (N/A) VIDEO ASSISTED THORACOSCOPY (Right)  Subjective: Patient  sitting in chair. He states tremors are much less  Objective: Vital signs in last 24 hours: Temp:  [97.6 F (36.4 C)-98.7 F (37.1 C)] 98.4 F (36.9 C) (11/19 0420) Pulse Rate:  [85-106] 97 (11/19 0420) Cardiac Rhythm: Normal sinus rhythm;Bundle branch block (11/19 0700) Resp:  [17-26] 17 (11/19 0420) BP: (104-165)/(62-87) 104/62 (11/19 0420) SpO2:  [92 %-95 %] 95 % (11/19 0420)     Intake/Output from previous day: 11/18 0701 - 11/19 0700 In: 4768.6 [I.V.:1855.1; NG/GT:2738.5; IV Piggyback:175] Out: 2200 [Urine:2200]   Physical Exam:  Cardiovascular: RRR Pulmonary: Clear to auscultation bilaterally Abdomen: Soft, non tender, bowel sounds present. Extremities: No lower extremity edema. Mild arm tremor this am Wounds: Clean and dry.  No erythema or signs of infection. Drains: Serous output from right chest tube drain  Lab Results: CBC: No results for input(s): "WBC", "HGB", "HCT", "PLT" in the last 72 hours.  BMET:  Recent Labs    05/26/22 0440 05/27/22 0323  NA 154* 150*  K 3.6 3.7  CL 122* 120*  CO2 23 22  GLUCOSE 154* 167*  BUN 36* 30*  CREATININE 1.58* 1.39*  CALCIUM 9.1 8.8*     PT/INR: No results for input(s): "LABPROT", "INR" in the last 72 hours. ABG:  INR: Will add last result for INR, ABG once components are confirmed Will add last 4 CBG results once components are confirmed  Assessment/Plan:  1. CV - First degree heart block. Occasional ST.  On Clonidine patch 0.1 mg Hydralazine 20 mg IV tid.  2.  Pulmonary - On room air. Right JP drain with light yellowish output and scant output. JP drain to remain.Encourage incentive spirometer 3. Anemia- Last H and H  8.2 and 28.2 4. GI-NPO. TFs  at 95 ml/hr. CBGs 100/131/114.Appreciate nutrition's recommendations. ? Swallow study Monday 5. ID-on Zosyn (possible aspiration PNA). Diflucan stopped 11/13. Continue Zosyn as had esophageal leak on this admission.  6. On Lovenox for DVT 7. Hypernatremia, -Sodium decreased to 150. Will continue free water every 2 hours via J tube and IVF 0.45% saline at 75 ml/hr. Will stop IVF in am if labs improved 8. Hypocalcemia-Calcium decreased to 8.8. 25-Hydroxy, Vitamin D and 25-Hydroxy, Vitamin D-3 121.  9. AKI-Creatinine decreased to 1.39.   10. Severe protein malnutrition-Last Albumin 2.4 10. Deconditioned-continue OT/PT. Home PT and OT arranged. Hopefully, home later this week   Donielle Liston Alba PA-C 05/28/2022,9:32 AM    Agree with above Swallow in am Home this week  Lajuana Matte

## 2022-05-28 NOTE — Progress Notes (Signed)
   05/28/22 1100  Mobility  Activity Ambulated with assistance in hallway  Level of Assistance Standby assist, set-up cues, supervision of patient - no hands on  Assistive Device Four wheel walker  Distance Ambulated (ft) 500 ft  Activity Response Tolerated well  Mobility Referral Yes  $Mobility charge 1 Mobility   Mobility Specialist Progress Note  Pre-Mobility: 94% SpO2(RA) During Mobility: 93% SpO2 (RA)  Pt was in chair and agreeable. Had no c/o pain during ambulation. Returned to chair w/ all needs met and call bell in reach.   Lucious Groves Mobility Specialist

## 2022-05-29 ENCOUNTER — Encounter: Payer: Self-pay | Admitting: Hematology and Oncology

## 2022-05-29 ENCOUNTER — Inpatient Hospital Stay (HOSPITAL_COMMUNITY): Payer: No Typology Code available for payment source

## 2022-05-29 LAB — COMPREHENSIVE METABOLIC PANEL
ALT: 38 U/L (ref 0–44)
AST: 35 U/L (ref 15–41)
Albumin: 2.4 g/dL — ABNORMAL LOW (ref 3.5–5.0)
Alkaline Phosphatase: 102 U/L (ref 38–126)
Anion gap: 6 (ref 5–15)
BUN: 22 mg/dL (ref 8–23)
CO2: 23 mmol/L (ref 22–32)
Calcium: 8.2 mg/dL — ABNORMAL LOW (ref 8.9–10.3)
Chloride: 116 mmol/L — ABNORMAL HIGH (ref 98–111)
Creatinine, Ser: 1.18 mg/dL (ref 0.61–1.24)
GFR, Estimated: >60 mL/min (ref >60)
Glucose, Bld: 157 mg/dL — ABNORMAL HIGH (ref 70–99)
Potassium: 3.6 mmol/L (ref 3.5–5.1)
Sodium: 145 mmol/L (ref 135–145)
Total Bilirubin: 0.5 mg/dL (ref 0.3–1.2)
Total Protein: 5.7 g/dL — ABNORMAL LOW (ref 6.5–8.1)

## 2022-05-29 LAB — PREALBUMIN: Prealbumin: 22 mg/dL (ref 18–38)

## 2022-05-29 LAB — GLUCOSE, CAPILLARY
Glucose-Capillary: 127 mg/dL — ABNORMAL HIGH (ref 70–99)
Glucose-Capillary: 142 mg/dL — ABNORMAL HIGH (ref 70–99)
Glucose-Capillary: 86 mg/dL (ref 70–99)
Glucose-Capillary: 83 mg/dL (ref 70–99)
Glucose-Capillary: 163 mg/dL — ABNORMAL HIGH (ref 70–99)
Glucose-Capillary: 117 mg/dL — ABNORMAL HIGH (ref 70–99)

## 2022-05-29 MED ORDER — CALCIUM GLUCONATE-NACL 2-0.675 GM/100ML-% IV SOLN
2.0000 g | Freq: Once | INTRAVENOUS | Status: AC
  Administered 2022-05-29: 2000 mg via INTRAVENOUS
  Filled 2022-05-29: qty 100

## 2022-05-29 MED ORDER — FREE WATER
200.0000 mL | Status: DC
  Administered 2022-05-29 – 2022-06-05 (×42): 200 mL

## 2022-05-29 MED ORDER — IOHEXOL 300 MG/ML  SOLN
10.0000 mL | Freq: Once | INTRAMUSCULAR | Status: AC | PRN
  Administered 2022-05-29: 10 mL via ORAL

## 2022-05-29 MED ORDER — SODIUM CHLORIDE 0.9% FLUSH
10.0000 mL | Freq: Two times a day (BID) | INTRAVENOUS | Status: DC
  Administered 2022-05-29 – 2022-06-05 (×13): 10 mL

## 2022-05-29 MED ORDER — SODIUM CHLORIDE 0.9% FLUSH
10.0000 mL | INTRAVENOUS | Status: DC | PRN
  Administered 2022-06-05: 10 mL

## 2022-05-29 NOTE — Progress Notes (Signed)
PT Cancellation Note  Patient Details Name: Poseidon Pam MRN: [id number] DOB: Oct 02, 1960   Cancelled Treatment:    Reason Eval/Treat Not Completed: Patient at procedure or test/unavailable (swallow study)  Wyona Almas, PT, DPT Acute Rehabilitation Services Office [place] 05/29/2022, 10:44 AM

## 2022-05-29 NOTE — Plan of Care (Signed)
  Problem: Education: Goal: Knowledge of General Education information will improve Description: Including pain rating scale, medication(s)/side effects and non-pharmacologic comfort measures Outcome: Progressing   Problem: Health Behavior/Discharge Planning: Goal: Ability to manage health-related needs will improve Outcome: Progressing   Problem: Clinical Measurements: Goal: Ability to maintain clinical measurements within normal limits will improve Outcome: Progressing Goal: Will remain free from infection Outcome: Progressing Goal: Diagnostic test results will improve Outcome: Progressing Goal: Respiratory complications will improve Outcome: Progressing Goal: Cardiovascular complication will be avoided Outcome: Progressing   Problem: Nutrition: Goal: Adequate nutrition will be maintained Outcome: Progressing   Problem: Coping: Goal: Level of anxiety will decrease Outcome: Progressing   Problem: Pain Managment: Goal: General experience of comfort will improve Outcome: Progressing   Problem: Education: Goal: Knowledge of the prescribed therapeutic regimen will improve Outcome: Progressing   Problem: Bowel/Gastric: Goal: Gastrointestinal status for postoperative course will improve Outcome: Progressing   Problem: Nutritional: Goal: Ability to achieve adequate nutritional intake will improve Outcome: Progressing   Problem: Respiratory: Goal: Ability to maintain a clear airway will improve Outcome: Progressing   Problem: Skin Integrity: Goal: Demonstration of wound healing without infection will improve Outcome: Progressing

## 2022-05-29 NOTE — Plan of Care (Signed)
  Problem: Education: Goal: Knowledge of General Education information will improve Description: Including pain rating scale, medication(s)/side effects and non-pharmacologic comfort measures Outcome: Progressing   Problem: Health Behavior/Discharge Planning: Goal: Ability to manage health-related needs will improve Outcome: Progressing   Problem: Clinical Measurements: Goal: Ability to maintain clinical measurements within normal limits will improve Outcome: Progressing Goal: Will remain free from infection Outcome: Progressing Goal: Diagnostic test results will improve Outcome: Progressing Goal: Respiratory complications will improve Outcome: Progressing Goal: Cardiovascular complication will be avoided Outcome: Progressing   Problem: Activity: Goal: Risk for activity intolerance will decrease Outcome: Progressing   Problem: Nutrition: Goal: Adequate nutrition will be maintained Outcome: Progressing   Problem: Coping: Goal: Level of anxiety will decrease Outcome: Progressing   Problem: Elimination: Goal: Will not experience complications related to bowel motility Outcome: Progressing Goal: Will not experience complications related to urinary retention Outcome: Progressing   Problem: Pain Managment: Goal: General experience of comfort will improve Outcome: Progressing   Problem: Safety: Goal: Ability to remain free from injury will improve Outcome: Progressing   Problem: Skin Integrity: Goal: Risk for impaired skin integrity will decrease Outcome: Progressing   Problem: Education: Goal: Knowledge of the prescribed therapeutic regimen will improve Outcome: Progressing   Problem: Bowel/Gastric: Goal: Gastrointestinal status for postoperative course will improve Outcome: Progressing   Problem: Nutritional: Goal: Ability to achieve adequate nutritional intake will improve Outcome: Progressing   Problem: Clinical Measurements: Goal: Postoperative  complications will be avoided or minimized Outcome: Progressing   Problem: Respiratory: Goal: Ability to maintain a clear airway will improve Outcome: Progressing   Problem: Skin Integrity: Goal: Demonstration of wound healing without infection will improve Outcome: Progressing

## 2022-05-29 NOTE — Progress Notes (Signed)
Occupational Therapy Treatment Patient Details Name: Latonya Knight MRN: [id number] DOB: [birth year] Today's Date: 05/29/2022   History of present illness Pt is 61 y/o male admitted [facility]/05/22 from cancer center with weakness, dizziness, difficulty swallowing, admitted for hypercalcemia. S/p esophagogastroscopy, VATS 10/31. Of note, pt with recent prolonged hospitalization (9/27-10/20) after esophagectomy with placement of J-tube. Other PMH includes esophageal CA, HTN, sacral wound.   OT comments  Pt progressing towards goals this session, completing simulated toilet transfer and hallway mobility with min guard-min A and use of rollator. Pt needing x1 rest break during session, cues for breathing techniques, SpO2 above 90% throughout. Pt declining ADLs and therex this session due to fatigue. Pt presenting with impairments listed below, will follow acutely. Continue to recommend HHOT at d/c.   Recommendations for follow up therapy are one component of a multi-disciplinary discharge planning process, led by the attending physician.  Recommendations may be updated based on patient status, additional functional criteria and insurance authorization.    Follow Up Recommendations  Home health OT     Assistance Recommended at Discharge Intermittent Supervision/Assistance  Patient can return home with the following  A little help with walking and/or transfers;Assistance with cooking/housework;Assist for transportation;Help with stairs or ramp for entrance;A lot of help with bathing/dressing/bathroom   Equipment Recommendations  None recommended by OT    Recommendations for Other Services      Precautions / Restrictions Precautions Precautions: Fall;Other (comment) Precaution Comments: JP drain; J-tube; intermittent shakiness Restrictions Weight Bearing Restrictions: No       Mobility Bed Mobility               General bed mobility comments: up in chair upon arrival and departure     Transfers Overall transfer level: Needs assistance Equipment used: Rollator (4 wheels), Rolling walker (2 wheels) Transfers: Sit to/from Stand       Step pivot transfers: Min guard           Balance Overall balance assessment: Mild deficits observed, not formally tested Sitting-balance support: Feet supported, No upper extremity supported Sitting balance-Leahy Scale: Good     Standing balance support: Bilateral upper extremity supported, During functional activity Standing balance-Leahy Scale: Fair                             ADL either performed or assessed with clinical judgement   ADL Overall ADL's : Needs assistance/impaired                     Lower Body Dressing: Minimal assistance;Sitting/lateral leans Lower Body Dressing Details (indicate cue type and reason): can cross legs in recliner to simulate LB dressing Toilet Transfer: Min guard;Rollator (4 wheels);Ambulation;Regular Glass blower/designer Details (indicate cue type and reason): simulated         Functional mobility during ADLs: Min guard;Rollator (4 wheels)      Extremity/Trunk Assessment Upper Extremity Assessment Upper Extremity Assessment: RUE deficits/detail RUE Deficits / Details: significant tremor, pt with history of surgery and hardware in his shoulder with longstanding limitations RUE Coordination: decreased fine motor;decreased gross motor LUE Deficits / Details: significant tremor LUE Coordination: decreased fine motor;decreased gross motor   Lower Extremity Assessment Lower Extremity Assessment: Defer to PT evaluation        Vision   Vision Assessment?: No apparent visual deficits   Perception Perception Perception: Not tested   Praxis Praxis Praxis: Intact    Cognition Arousal/Alertness: Awake/alert Behavior During Therapy:  WFL for tasks assessed/performed Overall Cognitive Status: Impaired/Different from baseline Area of Impairment: Problem  solving, Following commands, Safety/judgement                       Following Commands: Follows one step commands with increased time Safety/Judgement: Decreased awareness of deficits   Problem Solving: Requires verbal cues General Comments: Self-directed, pt self-monitoring for sx fatigue and able to indicate when he needs seated break        Exercises      Shoulder Instructions       General Comments VSS on RA, SpO2 above 90% throughout session    Pertinent Vitals/ Pain       Pain Assessment Pain Assessment: Faces Pain Score: 4  Faces Pain Scale: Hurts little more Pain Location: low back Pain Descriptors / Indicators: Discomfort Pain Intervention(s): Limited activity within patient's tolerance, Monitored during session, Repositioned  Home Living                                          Prior Functioning/Environment              Frequency  Min 2X/week        Progress Toward Goals  OT Goals(current goals can now be found in the care plan section)  Progress towards OT goals: Progressing toward goals  Acute Rehab OT Goals Patient Stated Goal: none stated OT Goal Formulation: With patient Time For Goal Achievement: 05/25/22 Potential to Achieve Goals: Good ADL Goals Pt Will Perform Grooming: with modified independence;standing Pt Will Perform Upper Body Bathing: with set-up;sitting Pt Will Perform Lower Body Bathing: with modified independence;sit to/from stand Pt Will Perform Upper Body Dressing: with set-up;sitting;standing Pt Will Perform Lower Body Dressing: with modified independence;sit to/from stand Pt Will Transfer to Toilet: with modified independence;regular height toilet;ambulating Pt/caregiver will Perform Home Exercise Program: Increased strength;Both right and left upper extremity;With theraband;With written HEP provided Additional ADL Goal #1: pt will indep recall at least 2 energy conservation strategies to apply  in the home setting  Plan Discharge plan remains appropriate    Co-evaluation                 AM-PAC OT "6 Clicks" Daily Activity     Outcome Measure   Help from another person eating meals?: None Help from another person taking care of personal grooming?: A Little Help from another person toileting, which includes using toliet, bedpan, or urinal?: A Little Help from another person bathing (including washing, rinsing, drying)?: A Lot Help from another person to put on and taking off regular upper body clothing?: A Little Help from another person to put on and taking off regular lower body clothing?: A Lot 6 Click Score: 17    End of Session Equipment Utilized During Treatment: Rollator (4 wheels)  OT Visit Diagnosis: Unsteadiness on feet (R26.81);Muscle weakness (generalized) (M62.81)   Activity Tolerance Patient tolerated treatment well   Patient Left in chair;with call bell/phone within reach;with family/visitor present   Nurse Communication Mobility status        Time: 6789-3810 OT Time Calculation (min): 18 min  Charges: OT General Charges $OT Visit: 1 Visit OT Treatments $Therapeutic Activity: 8-22 mins  Renaye Rakers, OTD, OTR/L SecureChat Preferred Acute Rehab [phone] [place] 05/29/2022, 1:13 PM

## 2022-05-29 NOTE — Progress Notes (Signed)
Physical Therapy Treatment and Discharge Patient Details Name: Keith Moreno MRN: [id number] DOB: Apr 07, 1961 Today's Date: 05/29/2022   History of Present Illness Pt is 61 y/o male admitted 05/05/22 from cancer center with weakness, dizziness, difficulty swallowing, admitted for hypercalcemia. S/p esophagogastroscopy, VATS 10/31. Of note, pt with recent prolonged hospitalization (9/27-10/20) after esophagectomy with placement of J-tube. Other PMH includes esophageal CA, HTN, sacral wound.    PT Comments    Pt met his physical therapy goals during his inpatient stay. Pt ambulating 500 ft with a Rollator modI and able to self monitor for fatigue and taking a rest break. SpO2 > 90% on RA throughout. Able to complete serial sit to stands for functional strengthening and leg power; encouraged trying to perform 3 sets of 15 repetitions throughout the day in addition to continued mobilization with staff or mobility tech. No further acute PT needs. See below for recommendations. Please re-consult if needs change.   Recommendations for follow up therapy are one component of a multi-disciplinary discharge planning process, led by the attending physician.  Recommendations may be updated based on patient status, additional functional criteria and insurance authorization.  Follow Up Recommendations  Home health PT     Assistance Recommended at Discharge Intermittent Supervision/Assistance  Patient can return home with the following A little help with bathing/dressing/bathroom;Assistance with cooking/housework;Assist for transportation;Help with stairs or ramp for entrance   Equipment Recommendations  None recommended by PT    Recommendations for Other Services       Precautions / Restrictions Precautions Precautions: Fall;Other (comment) Precaution Comments: JP drain; J-tube; intermittent shakiness Restrictions Weight Bearing Restrictions: No     Mobility  Bed Mobility Overal bed  mobility: Modified Independent                  Transfers Overall transfer level: Modified independent Equipment used: Rollator (4 wheels), Rolling walker (2 wheels) Transfers: Sit to/from Stand                  Ambulation/Gait Ambulation/Gait assistance: Modified independent (Device/Increase time) Gait Distance (Feet): 500 Feet Assistive device: Rollator (4 wheels) Gait Pattern/deviations: Step-through pattern, Decreased stride length, Trunk flexed       General Gait Details: Pt able to self monitor fatigue levels and take seated rest break. Overall slow and steady pace, good adherence to locking brakes and parking Rollator against wall prior to sitting   Stairs             Wheelchair Mobility    Modified Rankin (Stroke Patients Only)       Balance Overall balance assessment: Mild deficits observed, not formally tested Sitting-balance support: Feet supported, No upper extremity supported Sitting balance-Leahy Scale: Good     Standing balance support: Bilateral upper extremity supported, During functional activity Standing balance-Leahy Scale: Fair                              Cognition Arousal/Alertness: Awake/alert Behavior During Therapy: WFL for tasks assessed/performed Overall Cognitive Status: Within Functional Limits for tasks assessed                                 General Comments: WFLfor basic mobility, self directed session        Exercises Other Exercises Other Exercises: x5 sit to stands    General Comments General comments (skin integrity, edema, etc.): VSS on RA, SpO2  above 90% throughout session      Pertinent Vitals/Pain Pain Assessment Pain Assessment: Faces Faces Pain Scale: Hurts little more Pain Location: low back Pain Descriptors / Indicators: Discomfort Pain Intervention(s): Monitored during session    Home Living                          Prior Function             PT Goals (current goals can now be found in the care plan section) Acute Rehab PT Goals Potential to Achieve Goals: Good Progress towards PT goals: Goals met/education completed, patient discharged from PT    Frequency    Min 3X/week      PT Plan Other (comment) (d/c acute PT)    Co-evaluation              AM-PAC PT "6 Clicks" Mobility   Outcome Measure  Help needed turning from your back to your side while in a flat bed without using bedrails?: None Help needed moving from lying on your back to sitting on the side of a flat bed without using bedrails?: None Help needed moving to and from a bed to a chair (including a wheelchair)?: None Help needed standing up from a chair using your arms (e.g., wheelchair or bedside chair)?: None Help needed to walk in hospital room?: None Help needed climbing 3-5 steps with a railing? : A Little 6 Click Score: 23    End of Session   Activity Tolerance: Patient tolerated treatment well Patient left: in chair;with call bell/phone within reach;with nursing/sitter in room Nurse Communication: Mobility status PT Visit Diagnosis: Other abnormalities of gait and mobility (R26.89);Difficulty in walking, not elsewhere classified (R26.2);Muscle weakness (generalized) (M62.81)     Time: 6073-7106 PT Time Calculation (min) (ACUTE ONLY): 32 min  Charges:  $Therapeutic Activity: 23-37 mins                     Wyona Almas, PT, DPT Acute Rehabilitation Services Office [phone]    Deno Etienne 05/29/2022, 1:21 PM

## 2022-05-29 NOTE — TOC Progression Note (Signed)
Transition of Care (TOC) - Progression Note  Plan to discharge later this week. Updated Keith Moreno with [facility]   Patient Details  Name: Keith Moreno MRN: [id number] Date of Birth: [birth year]  Transition of Care [facility]) CM/SW Contact  Keith Moreno, Edson Snowball, RN Phone Number: 05/29/2022, 12:13 PM  Clinical Narrative:       Expected Discharge Plan: [place] Barriers to Discharge: Continued Medical Work up  Expected Discharge Plan and Services Expected Discharge Plan: [place]   Discharge Planning Services: CM Consult Post Acute Care Choice: [place] arrangements for the past 2 months: Single Family Home                 DME Arranged:  (see note)         HH Arranged: RN, PT, OT HH Agency: [place] Date [facility] Agency Contacted: 05/08/22 Time HH Agency Contacted: 76 Representative spoke with at [place]: cory   Social Determinants of Health (SDOH) Interventions    Readmission Risk Interventions     No data to display

## 2022-05-29 NOTE — Progress Notes (Addendum)
      [address]       [place] [address]             [phone]       20 Days Post-Op Procedure(s) (LRB): LAPAROSCOPIC JEJUNOSTOMY (N/A) ESOPHAGOGASTRODUODENOSCOPY (EGD) (N/A) VIDEO ASSISTED THORACOSCOPY (Right)  Subjective: Patient had a good day yesterday. He walked.  Objective: Vital signs in last 24 hours: Temp:  [98.1 F (36.7 C)-98.6 F (37 C)] 98.6 F (37 C) (11/20 0323) Pulse Rate:  [81-86] 86 (11/19 1954) Cardiac Rhythm: Normal sinus rhythm;Bundle branch block (11/19 1954) Resp:  [20-22] 20 (11/20 0323) BP: (133-152)/(64-82) 137/64 (11/20 0323) SpO2:  [95 %] 95 % (11/19 1954) Weight:  [145.4 kg] 145.4 kg (11/19 1154)     Intake/Output from previous day: 11/19 0701 - 11/20 0700 In: -  Out: 1500 [Urine:1500]   Physical Exam:  Cardiovascular: RRR Pulmonary: Clear to auscultation bilaterally Abdomen: Soft, non tender, bowel sounds present. Extremities: No lower extremity edema. No tremor this am Wounds: Clean and dry.  No erythema or signs of infection. Drains: Light yellowish output from right chest tube drain but scant output this am  Lab Results: CBC: No results for input(s): "WBC", "HGB", "HCT", "PLT" in the last 72 hours.  BMET:  Recent Labs    05/27/22 0323 05/29/22 0351  NA 150* 145  K 3.7 3.6  CL 120* 116*  CO2 22 23  GLUCOSE 167* 157*  BUN 30* 22  CREATININE 1.39* 1.18  CALCIUM 8.8* 8.2*     PT/INR: No results for input(s): "LABPROT", "INR" in the last 72 hours. ABG:  INR: Will add last result for INR, ABG once components are confirmed Will add last 4 CBG results once components are confirmed  Assessment/Plan:  1. CV - First degree heart block. Occasional ST.  On Clonidine patch 0.1 mg Hydralazine 20 mg IV tid.  2.  Pulmonary - On room air. Right JP drain with light yellowish output and scant output. JP drain to remain.Encourage incentive spirometer 3. Anemia- Last H and H  8.2 and 28.2 4. GI-NPO. TFs at  95 ml/hr. CBGs 132/123/127.Appreciate nutrition's recommendations. Await swallow study result this am 5. ID-on Zosyn (possible aspiration PNA). Diflucan stopped 11/13. Continue Zosyn as had esophageal leak on this admission.  6. On Lovenox for DVT 7. Hypernatremia resolved -Sodium decreased to 145.  Stop IVF 0.45% saline at 75 ml/hr.  8. Hypocalcemia-Calcium decreased to 8.2. 25-Hydroxy, Vitamin D and 25-Hydroxy, Vitamin D-3 121. Give Calcium gluconate 9. AKI resolved-Creatinine decreased to 1.18.   10. Severe protein malnutrition-Pre albumin 22 this am. Albumin 2.4 this am 10. Deconditioned-continue OT/PT. Home PT and OT arranged. Hopefully, home later this week   Keith Liston Alba PA-C 05/29/2022,7:02 AM      Agree with above Small shows smaller leak Continue abx, and tube feeds Nutritional status improved Dispo planning  Keith Moreno

## 2022-05-30 DIAGNOSIS — C159 Malignant neoplasm of esophagus, unspecified: Secondary | ICD-10-CM

## 2022-05-30 DIAGNOSIS — J9851 Mediastinitis: Secondary | ICD-10-CM | POA: Diagnosis not present

## 2022-05-30 LAB — GLUCOSE, CAPILLARY
Glucose-Capillary: 115 mg/dL — ABNORMAL HIGH (ref 70–99)
Glucose-Capillary: 131 mg/dL — ABNORMAL HIGH (ref 70–99)
Glucose-Capillary: 142 mg/dL — ABNORMAL HIGH (ref 70–99)
Glucose-Capillary: 165 mg/dL — ABNORMAL HIGH (ref 70–99)
Glucose-Capillary: 84 mg/dL (ref 70–99)
Glucose-Capillary: 92 mg/dL (ref 70–99)

## 2022-05-30 MED ORDER — AMOXICILLIN-POT CLAVULANATE 400-57 MG/5ML PO SUSR
872.0000 mg | Freq: Two times a day (BID) | ORAL | Status: DC
  Administered 2022-05-30 – 2022-06-05 (×13): 872 mg
  Filled 2022-05-30 (×14): qty 10.9

## 2022-05-30 NOTE — TOC Progression Note (Signed)
Transition of Care [facility]) - Progression Note    Patient Details  Name: Keith Moreno MRN: [id number] Date of Birth: 12/11/1960  Transition of Care [facility]) CM/SW Contact  Jacalyn Lefevre Edson Snowball, RN Phone Number: 05/30/2022, 11:19 AM  Clinical Narrative:    Potential discharge to home today. [facility] bed for home.   Patient has walker, Rolator, bariatric bedside commode at home. They received home tube feedings Saturday. Juku states she has received education on tube feeds but would like more "hands on". Nurse aware and will assist Juku with tube feeding education and allow her hands on education  Tommi Rumps with [facility] updated on potential discharge today or tomorrow.    Expected Discharge Plan: [place] Barriers to Discharge: Continued Medical Work up  Expected Discharge Plan and Services Expected Discharge Plan: [place]   Discharge Planning Services: CM Consult Post Acute Care Choice: [place] arrangements for the past 2 months: Single Family Home                 DME Arranged:  (see note)         HH Arranged: RN, PT, OT HH Agency: [place] Date [facility] Agency Contacted: 05/08/22 Time [place]: 8 Representative spoke with at [place]: cory   Social Determinants of Health ([place]) Interventions    Readmission Risk Interventions     No data to display

## 2022-05-30 NOTE — Consult Note (Signed)
[facility]ious Disease    Date of Admission:  05/05/2022     Reason for Consult: esophageal stent leak    Referring Provider: Kipp Brood     Lines:  Right chest chemo port Peripheral iv's  Abx: 10/31-c piptazo 10/31-11/13 fluconazole         Assessment: -Esophageal cancer s/p neoadjuvant chemo -S/p ivor-lewis intrathoracic esophagectomy/placement of j-tube -Esophageogastric anastomosis leak s/p stent 10/31, right thoracoscopy/placement of chest tube, and diagnostic laparoscopy with jejunostomy tube   Patient started on post-op empiric abx piptazo and fluconazole as above and has been doing well  Repeat esophagogram 11/20 still shows leakage but improved since prior  He has been non-toxic/septic. The chest drain is putting out minimal fluid  We can transition to oral abx and plan for another 4 weeks, to reduce need to assess his chemoport/risk of clabsi  Plan: stop piptazo Start amox-clav liquid 875 mg po bid Plan 4 weeks from now 11/21-12/19 Id clinic f/u as below Will sign off Disposition per CTS Discussed with primary team     Clinic Follow Up Appt: [id number] on 12/14 with dr Gale Journey  @  [facility] [place], [place], [place] [address] Phone: [phone]  I spent 65 minute reviewing data/chart, and coordinating care and >50% direct face to face time providing counseling/discussing diagnostics/treatment plan with patient  ------------------------------------------------ Principal Problem:   Hypercalcemia Active Problems:   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 male hx esophageal cancer s/p esophagectomy with dr Kipp Brood on 9/27, admitted 10/27 for decreased intake/difficulty with swallow and weakness found to have leakage at the anastomosis now s/p stent  placement and replacement of the j-tube  Hx via patient/chart  Patient had a long admission after initial surgery. He had an wound cx at the j-tube site and ct abd pelv done without any deep infection. He did have left sided pleural effusion and a therapeutic/diagnostic thoracentesis was done yielding 1 liter fluid -- cx negative. He did receive 7 day cipro post thoracentesis and jtube removed  He then was seen in the cancer center on 10/27 with sx of failure to thrive/poor intake, dysphagia with labs abnormality showing corrected calcium 13.4 so admitted  He underwent esophagogram 10/30 which showed contrast leakage so started on empiric abx and underwent esophageal stent placement/new jejunostomy on 10/31  He has done well since admission  A repeat esophagogram 11/20 showed improved leakage  He has no abx side effect    Family History  Problem Relation Age of Onset   Hypertension Mother    Diabetes Mother    Bipolar disorder Mother    Hypertension Father    Kidney cancer Sister    Colon cancer Paternal Uncle    Cancer Cousin    Bladder Cancer Cousin    Cancer Cousin     Social History   Tobacco Use   Smoking status: Former    Types: Cigars, Cigarettes, Pipe    Quit date: 1983    Years since quitting: 40.9   Smokeless tobacco: Former    Types: Chew    Quit date: 1983  Vaping Use   Vaping Use: Never used  Substance Use Topics   Alcohol use: Yes    Comment: Rarely   Drug use: No  Allergies  Allergen Reactions   Procaine     Other reaction(s): Other, Respiratory Distress   Chocolate     Hyperactivity, respiratory distress   Other     Novocaine- respiratory distress    Review of Systems: ROS All Other ROS was negative, except mentioned above   Past Medical History:  Diagnosis Date   Anxiety    Cancer ([place])    Esophageal Cancer   COVID 2022   mild case   Dyspnea    r/t chemo and radiation   Headache    History of kidney stones    Hypertension     Kidney infection    at age 33   Pneumonia    61 years old   Pre-diabetes    PTSD (post-traumatic stress disorder)    per pt, if woken up suddenly he "cocks back arm" as if to punch but usually wakes up enough to come to before he hits anyone       Scheduled Meds:  Chlorhexidine Gluconate Cloth  6 each Topical Daily   cloNIDine  0.1 mg Transdermal Weekly   enoxaparin (LOVENOX) injection  40 mg Subcutaneous Daily   feeding supplement (OSMOLITE 1.5 CAL)  1,710 mL Per Tube Q24H   feeding supplement (PROSource TF20)  60 mL Per Tube BID   free water  200 mL Per Tube Q4H   hydrALAZINE  20 mg Intravenous Q8H   insulin aspart  0-24 Units Subcutaneous Q4H   liver oil-zinc oxide   Topical BID   olopatadine  1 drop Both Eyes BID   mouth rinse  15 mL Mouth Rinse 4 times per day   pantoprazole (PROTONIX) IV  40 mg Intravenous Q12H   potassium chloride  20 mEq Per Tube Daily   sodium chloride flush  10-40 mL Intracatheter Q12H   Continuous Infusions:  chlorproMAZINE (THORAZINE) 12.5 mg in sodium chloride 0.9 % 25 mL IVPB 12.5 mg (05/27/22 1301)   piperacillin-tazobactam (ZOSYN)  IV 3.375 g (05/30/22 1914)   PRN Meds:.acetaminophen (TYLENOL) oral liquid 160 mg/5 mL, chlorproMAZINE (THORAZINE) 12.5 mg in sodium chloride 0.9 % 25 mL IVPB, guaiFENesin, HYDROcodone-acetaminophen, morphine injection, ondansetron (ZOFRAN) IV, mouth rinse, sodium chloride flush   OBJECTIVE: Blood pressure (!) 149/79, pulse 90, temperature 98.5 F (36.9 C), temperature source Oral, resp. rate (!) 21, height 6' (1.829 m), weight (!) 145.4 kg, SpO2 98 %.  Physical Exam  General/constitutional: no distress, pleasant HEENT: Normocephalic, PER, Conj Clear, EOMI, Oropharynx clear Neck supple CV: rrr no mrg Lungs: normal respiratory effort -- right chest jp drain site dressing clean/dry Abd: Soft, Nontender -- j-tube site dressing clean/dry Ext: no edema Skin: No Rash Neuro: nonfocal MSK: no peripheral joint  swelling/tenderness/warmth; back spines nontender   Central line presence: right chest chemoport no erythema/purulence/tenderness   Lab Results Lab Results  Component Value Date   WBC 3.8 (L) 05/23/2022   HGB 8.2 (L) 05/23/2022   HCT 28.2 (L) 05/23/2022   MCV 97.9 05/23/2022   PLT 175 05/23/2022    Lab Results  Component Value Date   CREATININE 1.18 05/29/2022   BUN 22 05/29/2022   NA 145 05/29/2022   K 3.6 05/29/2022   CL 116 (H) 05/29/2022   CO2 23 05/29/2022    Lab Results  Component Value Date   ALT 38 05/29/2022   AST 35 05/29/2022   ALKPHOS 102 05/29/2022   BILITOT 0.5 05/29/2022      Microbiology: No results found for this or any previous visit (  from the past 240 hour(s)).   Serology:    Imaging: If present, new imagings (plain films, ct scans, and mri) have been personally visualized and interpreted; radiology reports have been reviewed. Decision making incorporated into the Impression / Recommendations.  11/20 esophagogram FINDINGS: Focused, single-contrast exam performed.   Metallic stent within the remaining esophagus and gastric pull-through. There is hyperattenuation to the right of the stent on the initial images which may be residual contrast from the 05/08/2022 exam. However, with subsequent swallows, just cephalad to this, an area of small volume active contrast extravasation is seen within the upper to mid thorax, including on 80/1. For this reason, further swallows were not performed. Contrast stasis throughout.   IMPRESSION: 1. Persistent contrast leak, within the upper to mid thorax, eccentric right.   2.  Stasis within the metallic stent.   05/17/22 cxr Improved aeration with improved to resolved interstitial edema and nearly resolved left pleural effusion. Slightly improved left base opacity, favoring atelectasis. No pneumothorax.   05/10/22 cxr 1. Right chest tube in place without definite visible pneumothorax. 2. Similar  mediastinal contour, discussed on the prior. 3. Similar left basilar opacity, possibly a combination of layering pleural effusion overlying atelectasis and/or consolidation.      Jabier Mutton, [facility] [place] [phone] pager    05/30/2022, 10:36 AM

## 2022-05-30 NOTE — Progress Notes (Signed)
Mobility Specialist Progress Note    05/30/22 1146  Mobility  Activity Ambulated independently in hallway  Level of Assistance Modified independent, requires aide device or extra time  Assistive Device Four wheel walker  Distance Ambulated (ft) 420 ft  Activity Response Tolerated well  Mobility Referral Yes  $Mobility charge 1 Mobility   Pre-Mobility: 91 HR, 96% SpO2 During Mobility: 114 HR, >/=88% SpO2 Post-Mobility: 106 HR, 96% SpO2  Pt received in bed and agreeable. No complaints on walk. Returned to bed with call bell in reach.    Hildred Alamin Mobility Specialist  Please Psychologist, sport and exercise or Rehab Office at [phone]

## 2022-05-30 NOTE — Progress Notes (Signed)
Nutrition Follow-up  DOCUMENTATION CODES:   Non-severe (moderate) malnutrition in context of chronic illness  INTERVENTION:   Strict NPO for now  Tube Feeding via J-tube:   Osmolite 1.5 at 95 ml/hr via J-tube x 18 hours daily. (Infuses currently from 1600 to 1000 daily) This is patient goal Goal regimen provides 2565 kcals, 107 g of protein and 1300 mL of free water  Continue Free Water flushes of 200 mL q 4 hours to meet hydration needs. Total free water 2500 mL  Discontinue Pro-Source TF  NUTRITION DIAGNOSIS:   Moderate Malnutrition related to chronic illness (esophageal cancer) as evidenced by mild fat depletion, moderate muscle depletion, percent weight loss (8.9% weight loss in less than 5 months).  Being addressed via TF  GOAL:   Patient will meet greater than or equal to 90% of their needs  Being addressed via TF  MONITOR:   Labs, Weight trends, TF tolerance, Skin, I & O's  REASON FOR ASSESSMENT:   Consult Enteral/tube feeding initiation and management  ASSESSMENT:   61 year old male who presented on 10/27 as a direct admission from the [facility] with poor oral intake, dysphagia with fluids, hypercalcemia. PMH of esophageal adenocarcinoma s/p Ivor Lewis esophagectomy and J-tube placement on 04/05/22 (later removed prior to discharge), HTN, pre-DM.  Noted plan for discharge home this week.   Pt looking good and in good spirits walking around unit today with walker and mobility specialist.   Esophagram yesterday with persistent esophageal leak, MD questioning if it is possibly contained. Remains NPO  Sodium has improved significantly with administration of free water. Calcium has improved as well  Osmolite  1.5 at 95 ml/hr x 18 hours, Pro-source TF20 BID via J-tube  Current Free water of 200 mL q 4 hours; last modified yesterday. No longer receiving IV hydration  Last weight 11/19 145.4 kg  Labs: sodium 145 (wdl), Creatinine wdl Meds:  reviewed    Diet Order:   Diet Order             Diet NPO time specified  Diet effective now                   EDUCATION NEEDS:   Education needs have been addressed  Skin:  Skin Assessment: Skin Integrity Issues: Skin Integrity Issues:: Stage II, Incisions Stage II: L buttocks Incisions: abdomen, R chest  Last BM:  11/20 type 6  Height:   Ht Readings from Last 1 Encounters:  05/11/22 6' (1.829 m)    Weight:   Wt Readings from Last 1 Encounters:  05/28/22 (!) 145.4 kg    Ideal Body Weight:  80.9 kg  BMI:  Body mass index is 43.47 kg/m.  Estimated Nutritional Needs:   Kcal:  2500-2700  Protein:  125-145 grams  Fluid:  >2.2 L   [name] MS, RDN, LDN, CNSC Registered Dietitian 3 Clinical Nutrition RD Pager and On-Call Pager Number Located in [place]

## 2022-05-30 NOTE — Progress Notes (Signed)
      [address]       [place] [address]             [phone]      [facility]  [facility].  Per patient they will try and squeeze his delivery in for tomorrow.  However, if that is not possible it will likely be next week.   The patient will be discharged once hospital bed is delivered.  Ellwood Handler, PA-C

## 2022-05-30 NOTE — Progress Notes (Addendum)
      [address]       [place] [address]             [phone]      21 Days Post-Op Procedure(s) (LRB): LAPAROSCOPIC JEJUNOSTOMY (N/A) ESOPHAGOGASTRODUODENOSCOPY (EGD) (N/A) VIDEO ASSISTED THORACOSCOPY (Right)  Subjective:  Overall patient doing better.  He is ambulating in the hallway.  [facility].  Objective: Vital signs in last 24 hours: Temp:  [97.9 F (36.6 C)-98.6 F (37 C)] 98.5 F (36.9 C) (11/21 0736) Pulse Rate:  [81-94] 90 (11/21 0736) Cardiac Rhythm: Normal sinus rhythm;Heart block;Bundle branch block (11/21 0700) Resp:  [15-21] 21 (11/21 0736) BP: (130-155)/(67-89) 149/79 (11/21 0736) SpO2:  [93 %-98 %] 98 % (11/21 0736)  Intake/Output from previous day: 11/20 0701 - 11/21 0700 In: 7444 [NG/GT:7180.5; IV Piggyback:263.5] Out: 3800 [Urine:3800]  General appearance: alert, cooperative, and no distress Heart: regular rate and rhythm Lungs: clear to auscultation bilaterally Abdomen: soft, non-tender; bowel sounds normal; no masses,  no organomegaly Extremities: extremities normal, atraumatic, no cyanosis or edema Wound: clean and dry, JP drain with scant output  Lab Results: No results for input(s): "WBC", "HGB", "HCT", "PLT" in the last 72 hours. BMET:  Recent Labs    05/29/22 0351  NA 145  K 3.6  CL 116*  CO2 23  GLUCOSE 157*  BUN 22  CREATININE 1.18  CALCIUM 8.2*    PT/INR: No results for input(s): "LABPROT", "INR" in the last 72 hours. ABG    Component Value Date/Time   PHART 7.450 05/09/2022 0824   HCO3 25.8 05/09/2022 0824   TCO2 27 05/09/2022 0824   ACIDBASEDEF 0.3 04/06/2022 0332   O2SAT 100 05/09/2022 0824   CBG (last 3)  Recent Labs    05/29/22 2259 05/30/22 0302 05/30/22 0735  GLUCAP 117* 142* 165*    Assessment/Plan: S/P Procedure(s) (LRB): LAPAROSCOPIC JEJUNOSTOMY (N/A) ESOPHAGOGASTRODUODENOSCOPY (EGD) (N/A) VIDEO ASSISTED THORACOSCOPY (Right)  CV- NSR with 1st  degree AV Block + HTN- continue Clonidine patch, Hydralazine Pulm- on RA, JP drain with minimal output leave in place to gravity GI- tube feeds at 95 ml/hr.. NPO, patient educated on importance of not taking anything other than 1 cup of ice chips per mouth ID- will consult ID for discharge ABX regimen Severe protein malnutrition Deconditioning- PT/OT, home health arranged Dispo- patient stable, for tentative discharge today, will await meeting about hospital bed and ID recommendations for ABX   LOS: 25 days    Ellwood Handler, PA-C 05/30/2022  Agree with above Leak improved, ?contained Will keep [place]

## 2022-05-31 ENCOUNTER — Other Ambulatory Visit (HOSPITAL_COMMUNITY): Payer: Self-pay

## 2022-05-31 ENCOUNTER — Ambulatory Visit: Payer: No Typology Code available for payment source | Admitting: Dietician

## 2022-05-31 LAB — GLUCOSE, CAPILLARY
Glucose-Capillary: 115 mg/dL — ABNORMAL HIGH (ref 70–99)
Glucose-Capillary: 118 mg/dL — ABNORMAL HIGH (ref 70–99)
Glucose-Capillary: 123 mg/dL — ABNORMAL HIGH (ref 70–99)
Glucose-Capillary: 140 mg/dL — ABNORMAL HIGH (ref 70–99)
Glucose-Capillary: 150 mg/dL — ABNORMAL HIGH (ref 70–99)
Glucose-Capillary: 78 mg/dL (ref 70–99)

## 2022-05-31 MED ORDER — METFORMIN HCL 500 MG/5ML PO SOLN: 5.0000 mL | Freq: Two times a day (BID) | ORAL | 3 refills | Status: DC

## 2022-05-31 MED ORDER — GUAIFENESIN 100 MG/5ML PO LIQD: 15.0000 mL | ORAL | 0 refills | Status: DC | PRN

## 2022-05-31 MED ORDER — AMOXICILLIN-POT CLAVULANATE 400-57 MG/5ML PO SUSR: 872.0000 mg | Freq: Two times a day (BID) | ORAL | 0 refills | Status: DC

## 2022-05-31 MED ORDER — CLONIDINE 0.1 MG/24HR TD PTWK: 0.1000 mg | MEDICATED_PATCH | TRANSDERMAL | 12 refills | Status: DC

## 2022-05-31 MED ORDER — CLONIDINE 0.1 MG/24HR TD PTWK
0.1000 mg | MEDICATED_PATCH | TRANSDERMAL | 12 refills | Status: DC
  Filled 2022-05-31: qty 4, 28d supply, fill #0

## 2022-05-31 MED ORDER — HYDROCODONE-ACETAMINOPHEN 7.5-325 MG/15ML PO SOLN
10.0000 mL | Freq: Four times a day (QID) | ORAL | 0 refills | Status: DC | PRN
  Filled 2022-05-31: qty 280, 7d supply, fill #0

## 2022-05-31 MED ORDER — NORMAL SALINE FLUSH 0.9 % IV SOLN
30.0000 mL | Freq: Three times a day (TID) | INTRAVENOUS | 3 refills | Status: DC
  Filled 2022-05-31: qty 2700, 30d supply, fill #0

## 2022-05-31 MED ORDER — HYDROCODONE-ACETAMINOPHEN 7.5-325 MG/15ML PO SOLN: 10.0000 mL | Freq: Four times a day (QID) | ORAL | 0 refills | Status: DC | PRN

## 2022-05-31 MED ORDER — ACETAMINOPHEN 160 MG/5ML PO SOLN: 650.0000 mg | Freq: Four times a day (QID) | ORAL | 0 refills | Status: DC | PRN

## 2022-05-31 MED ORDER — AMOXICILLIN-POT CLAVULANATE 400-57 MG/5ML PO SUSR
872.0000 mg | Freq: Two times a day (BID) | ORAL | 0 refills | Status: DC
  Filled 2022-05-31: qty 620, 28d supply, fill #0

## 2022-05-31 MED ORDER — OSMOLITE 1.5 CAL PO LIQD: 1710.0000 mL | ORAL | 3 refills | Status: DC

## 2022-05-31 MED ORDER — PANTOPRAZOLE SODIUM 40 MG PO PACK
40.0000 mg | PACK | Freq: Two times a day (BID) | ORAL | 3 refills | Status: DC
  Filled 2022-05-31: qty 60, 30d supply, fill #0

## 2022-05-31 MED ORDER — PANTOPRAZOLE SODIUM 40 MG PO PACK: 40.0000 mg | PACK | Freq: Two times a day (BID) | ORAL | 3 refills | Status: DC

## 2022-05-31 MED ORDER — METOPROLOL TARTRATE 25 MG/10 ML ORAL SUSPENSION
12.5000 mg | Freq: Two times a day (BID) | ORAL | 3 refills | Status: DC
  Filled 2022-05-31: qty 300, 30d supply, fill #0

## 2022-05-31 MED ORDER — METOPROLOL TARTRATE 25 MG/10 ML ORAL SUSPENSION: 12.5000 mg | Freq: Two times a day (BID) | ORAL | 3 refills | Status: DC

## 2022-05-31 MED ORDER — METFORMIN HCL 500 MG/5ML PO SOLN
5.0000 mL | Freq: Two times a day (BID) | ORAL | 3 refills | Status: DC
  Filled 2022-05-31: qty 300, 30d supply, fill #0

## 2022-05-31 NOTE — Progress Notes (Addendum)
      [address]       [place] [address]             [phone]      22 Days Post-Op Procedure(s) (LRB): LAPAROSCOPIC JEJUNOSTOMY (N/A) ESOPHAGOGASTRODUODENOSCOPY (EGD) (N/A) VIDEO ASSISTED THORACOSCOPY (Right)  Subjective:  Patient without complaints.  He continues to do well.  His wife is going home to wait on hospital bed today.  Objective: Vital signs in last 24 hours: Temp:  [98.1 F (36.7 C)-98.7 F (37.1 C)] 98.3 F (36.8 C) (11/22 0326) Pulse Rate:  [90-103] 96 (11/21 2339) Cardiac Rhythm: Junctional rhythm;Other (Comment);Bundle branch block (11/22 0700) Resp:  [17-24] 19 (11/22 0326) BP: (124-157)/(70-89) 124/71 (11/22 0326) SpO2:  [95 %-98 %] 95 % (11/21 2339)  Intake/Output from previous day: 11/21 0701 - 11/22 0700 In: 2990.3 [NG/GT:2990.3] Out: 1350 [Urine:1350]  General appearance: alert, cooperative, and no distress Heart: regular rate and rhythm Lungs: clear to auscultation bilaterally Abdomen: soft, non-tender; bowel sounds normal; no masses,  no organomegaly Extremities: extremities normal, atraumatic, no cyanosis or edema Wound: clean and dry  Lab Results: No results for input(s): "WBC", "HGB", "HCT", "PLT" in the last 72 hours. BMET:  Recent Labs    05/29/22 0351  NA 145  K 3.6  CL 116*  CO2 23  GLUCOSE 157*  BUN 22  CREATININE 1.18  CALCIUM 8.2*    PT/INR: No results for input(s): "LABPROT", "INR" in the last 72 hours. ABG    Component Value Date/Time   PHART 7.450 05/09/2022 0824   HCO3 25.8 05/09/2022 0824   TCO2 27 05/09/2022 0824   ACIDBASEDEF 0.3 04/06/2022 0332   O2SAT 100 05/09/2022 0824   CBG (last 3)  Recent Labs    05/30/22 1923 05/30/22 2337 05/31/22 0325  GLUCAP 131* 115* 150*    Assessment/Plan: S/P Procedure(s) (LRB): LAPAROSCOPIC JEJUNOSTOMY (N/A) ESOPHAGOGASTRODUODENOSCOPY (EGD) (N/A) VIDEO ASSISTED THORACOSCOPY (Right)  CV-NSR with 1st degree AV Block, HTN- continue Clonidine  patch, Hydralazine Pulm- on RA, JP drain with minimal output, leave in place to Gravity GI- tube feeds at 95 ml/hr... keep NPO ID- appreciate assistance will transition to oral Augmentin at discharge Severe Protein Malnutrition-following dietary recommendations Deconditioning- PT/OT, home health arranged Dispo- patient stable, hopefully hospital bed will be delivered today, if so will be for discharge home tomorrow   LOS: 26 days    Ellwood Handler, PA-C 05/31/2022  Agree with above. Dispo planning.  Tamila Gaulin Bary Leriche

## 2022-05-31 NOTE — Progress Notes (Signed)
Mobility Specialist Progress Note    05/31/22 1247  Mobility  Activity Ambulated independently in hallway  Level of Assistance Modified independent, requires aide device or extra time  Assistive Device Four wheel walker  Distance Ambulated (ft) 400 ft  Activity Response Tolerated well  Mobility Referral Yes  $Mobility charge 1 Mobility   Pre-Mobility: 96 HR, 96% SpO2 During Mobility: 127 HR, >/=90% SpO2 Post-Mobility: 104 HR, 96% SpO2  Pt received in chair and agreeable. No complaints on walk. Took x1 seated rest break. Returned to bed with call bell in reach.    Hildred Alamin Mobility Specialist  Please Psychologist, sport and exercise or Rehab Office at [phone]

## 2022-05-31 NOTE — Progress Notes (Signed)
Called patient on his mobile to see if his TF supplies and hospital bed have arrived.  He reports he thinks he has everything he needs for his feeds and bed is supposed to arrive today.  When I asked him what his level of comfort in hooking up his pumps and administering his feeds he said he had none but he thinks his wife is has been reading up on things.  I sent a text to his mobile with the oncology RD contact information to follow him as an outpatient since the [place] is managing his supplies and he has completed his chemo/radiation treatments.   [facility] contact is Rowe Robert [phone], mobile [phone], email [email or web address]  She will be out of the office until Monday.  Immediate concerns regarding supplies or needs should be addressed to the group email  [email or web address]   He provided permission to call wife and provide ask about supplies and educational needs.  I called wife and gave her the contact information but also told her that this dietitian would be out of the office until Monday.  She report that she is going to bring the pump to the hospital and have them provide education prior to discharge.    She also said that the hospital bed was arriving as we were ending the conversation.  [name], RDN, LDN Registered Dietitian, [facility] Part Time Remote (Usual office hours: Tuesday-Thursday) Mobile: [phone] Remote Office: [phone]

## 2022-05-31 NOTE — Discharge Instructions (Signed)
PLEASE FLUSH J TUBE WITH 30 ML EVERY 8 HOURS  DIET:  NOTHING BY MOUTH, YOU MAY HAVE 1 CUP OF ICE CHIPS DAILY  DO NOT PUT ANY PILLS DOWN YOUR J TUBE  ACTIVITY UP AS TOLERATED  YOU MAY SPONGE BATH  CONTACT OUR OFFICE AT [phone] IF ISSUES [facility]

## 2022-05-31 NOTE — Progress Notes (Signed)
Occupational Therapy Treatment Patient Details Name: Keith Moreno MRN: [id number] DOB: [birth year] Today's Date: 05/31/2022   History of present illness Pt is 61 y/o male [facility]05/[facility] from [facility] with weakness, dizziness, difficulty swallowing, admitted for hypercalcemia. S/p esophagogastroscopy, VATS 10/31. Of note, pt with recent prolonged hospitalization (9/27-10/20) after esophagectomy with placement of J-tube. Other PMH includes esophageal CA, HTN, sacral wound.   OT comments  Pt progressing towards goals this session, needing supervision-min guard for seated ADLs. Pt mod I fo rbe dmobility and transfers without AD to take few steps toward [facility]. Pt able to complete seated UB/LB therex at EOB. Educated on energy conservation strategies, pt verbalized understanding and is able to problem solve and find ways to incorporate techniques into daily routine. Pt presenting with impairments listed below, will follow acutely. Continue to recommend HHOT at d/c.   Recommendations for follow up therapy are one component of a multi-disciplinary discharge planning process, led by the attending physician.  Recommendations may be updated based on patient status, additional functional criteria and insurance authorization.    Follow Up Recommendations  Home health OT     Assistance Recommended at Discharge Intermittent Supervision/Assistance  Patient can return home with the following  A little help with walking and/or transfers;Assistance with cooking/housework;Assist for transportation;Help with stairs or ramp for entrance;A lot of help with bathing/dressing/bathroom   Equipment Recommendations  None recommended by OT    Recommendations for Other Services      Precautions / Restrictions Precautions Precautions: Fall;Other (comment) Precaution Comments: JP drain; J-tube; intermittent shakiness Restrictions Weight Bearing Restrictions: No       Mobility Bed Mobility Overal bed  mobility: Modified Independent Bed Mobility: Supine to Sit, Sit to Supine     Supine to sit: HOB elevated, Supervision Sit to supine: Supervision, HOB elevated        Transfers Overall transfer level: Modified independent Equipment used: None Transfers: Sit to/from Stand Sit to Stand: Modified independent (Device/Increase time)           General transfer comment: lateral stepping to L side toward HOB     Balance Overall balance assessment: Mild deficits observed, not formally tested Sitting-balance support: Feet supported, No upper extremity supported Sitting balance-Leahy Scale: Good Sitting balance - Comments: can reach outside BOS without LOB   Standing balance support: Bilateral upper extremity supported, During functional activity Standing balance-Leahy Scale: Fair Standing balance comment: appears unstead due to UE shakes but manages dynamic standing balance well, especially with rollator repositioning during sit to stand transfers and clothing mgmt before/after mobility                           ADL either performed or assessed with clinical judgement   ADL Overall ADL's : Needs assistance/impaired                     Lower Body Dressing: Supervision/safety Lower Body Dressing Details (indicate cue type and reason): figure 4 to don /doff socks Toilet Transfer: Min Designer, jewellery Details (indicate cue type and reason): simulated         Functional mobility during ADLs: Min guard      Extremity/Trunk Assessment Upper Extremity Assessment Upper Extremity Assessment: RUE deficits/detail RUE Deficits / Details: significant tremor, pt with history of surgery and hardware in his shoulder with longstanding limitations RUE Coordination: decreased fine motor;decreased gross motor LUE Deficits / Details: significant tremor LUE Coordination: decreased fine motor;decreased gross  motor   Lower Extremity Assessment Lower  Extremity Assessment: Defer to PT evaluation        Vision   Vision Assessment?: No apparent visual deficits   Perception Perception Perception: Not tested   Praxis Praxis Praxis: Not tested    Cognition Arousal/Alertness: Awake/alert Behavior During Therapy: WFL for tasks assessed/performed Overall Cognitive Status: Within Functional Limits for tasks assessed                                          Exercises Exercises: Other exercises, General Upper Extremity, General Lower Extremity General Exercises - Upper Extremity Shoulder Flexion: AROM, Both, 10 reps, Seated General Exercises - Lower Extremity Ankle Circles/Pumps: AROM, Both, 10 reps, Seated Long Arc Quad: AROM, Both, 10 reps, Seated Hip Flexion/Marching: AROM, Both, 10 reps, Seated    Shoulder Instructions       General Comments VSS on RA    Pertinent Vitals/ Pain       Pain Assessment Pain Assessment: Faces Pain Score: 4  Pain Location: low back Pain Descriptors / Indicators: Discomfort Pain Intervention(s): Monitored during session, Limited activity within patient's tolerance, Repositioned  Home Living                                          Prior Functioning/Environment              Frequency  Min 2X/week        Progress Toward Goals  OT Goals(current goals can now be found in the care plan section)  Progress towards OT goals: Progressing toward goals  Acute Rehab OT Goals Patient Stated Goal: none stated OT Goal Formulation: With patient Time For Goal Achievement: 05/25/22 Potential to Achieve Goals: Good ADL Goals Pt Will Perform Grooming: Independently;standing Pt Will Perform Upper Body Bathing: Independently;sitting Pt Will Perform Lower Body Bathing: with modified independence;sit to/from stand Pt Will Perform Upper Body Dressing: Independently;sitting Pt Will Perform Lower Body Dressing: with modified independence;sit to/from stand Pt  Will Transfer to Toilet: Independently;regular height toilet;ambulating Pt/caregiver will Perform Home Exercise Program: Increased strength;Both right and left upper extremity;With theraband;With written HEP provided Additional ADL Goal #1: pt will indep recall at least 2 energy conservation strategies to apply in the home setting  Plan Discharge plan remains appropriate    Co-evaluation                 AM-PAC OT "6 Clicks" Daily Activity     Outcome Measure   Help from another person eating meals?: None Help from another person taking care of personal grooming?: A Little Help from another person toileting, which includes using toliet, bedpan, or urinal?: A Little Help from another person bathing (including washing, rinsing, drying)?: A Lot Help from another person to put on and taking off regular upper body clothing?: A Little Help from another person to put on and taking off regular lower body clothing?: A Little 6 Click Score: 18    End of Session    OT Visit Diagnosis: Unsteadiness on feet (R26.81);Muscle weakness (generalized) (M62.81)   Activity Tolerance Patient tolerated treatment well   Patient Left in bed;with call bell/phone within reach;with bed alarm set   Nurse Communication Mobility status        Time: 0962-8366 OT Time Calculation (min): 30  min  Charges: OT General Charges $OT Visit: 1 Visit OT Treatments $Self Care/Home Management : 8-22 mins $Therapeutic Exercise: 8-22 mins  Renaye Rakers, OTD, OTR/L SecureChat Preferred Acute Rehab [phone]   Ulla Gallo 05/31/2022, 3:50 PM

## 2022-06-01 LAB — GLUCOSE, CAPILLARY
Glucose-Capillary: 136 mg/dL — ABNORMAL HIGH (ref 70–99)
Glucose-Capillary: 147 mg/dL — ABNORMAL HIGH (ref 70–99)
Glucose-Capillary: 101 mg/dL — ABNORMAL HIGH (ref 70–99)
Glucose-Capillary: 89 mg/dL (ref 70–99)
Glucose-Capillary: 147 mg/dL — ABNORMAL HIGH (ref 70–99)
Glucose-Capillary: 118 mg/dL — ABNORMAL HIGH (ref 70–99)

## 2022-06-01 MED ORDER — METOPROLOL TARTRATE 25 MG/10 ML ORAL SUSPENSION: 12.5000 mg | Freq: Two times a day (BID) | ORAL | 3 refills | Status: DC

## 2022-06-01 MED ORDER — AMOXICILLIN-POT CLAVULANATE 400-57 MG/5ML PO SUSR: 872.0000 mg | Freq: Two times a day (BID) | ORAL | 0 refills | Status: DC

## 2022-06-01 MED ORDER — METFORMIN HCL 500 MG/5ML PO SOLN: 5.0000 mL | Freq: Two times a day (BID) | ORAL | 3 refills | Status: DC

## 2022-06-01 MED ORDER — HYDROCODONE-ACETAMINOPHEN 7.5-325 MG/15ML PO SOLN: 10.0000 mL | Freq: Four times a day (QID) | ORAL | 0 refills | Status: DC | PRN

## 2022-06-01 MED ORDER — CLONIDINE 0.1 MG/24HR TD PTWK: 0.1000 mg | MEDICATED_PATCH | TRANSDERMAL | 12 refills | Status: DC

## 2022-06-01 NOTE — Progress Notes (Signed)
      [address]       [place] [address]             [phone]     23 Days Post-Op Procedure(s) (LRB): LAPAROSCOPIC JEJUNOSTOMY (N/A) ESOPHAGOGASTRODUODENOSCOPY (EGD) (N/A) VIDEO ASSISTED THORACOSCOPY (Right) Subjective: Feels well  Objective: Vital signs in last 24 hours: Temp:  [98.1 F (36.7 C)-98.6 F (37 C)] 98.5 F (36.9 C) (11/23 0737) Pulse Rate:  [87-94] 87 (11/23 0342) Cardiac Rhythm: Bundle branch block;Heart block (11/23 0703) Resp:  [14-24] 15 (11/23 0737) BP: (126-150)/(73-95) 150/85 (11/23 0737) SpO2:  [93 %-98 %] 93 % (11/23 0342)  Hemodynamic parameters for last 24 hours:    Intake/Output from previous day: 11/22 0701 - 11/23 0700 In: 2098.3 [NG/GT:2098.3] Out: 2000 [Urine:2000] Intake/Output this shift: No intake/output data recorded.  General appearance: alert, cooperative, and no distress Heart: regular rate and rhythm Lungs: clear to auscultation bilaterally Abdomen: benign Extremities: WWP Wound: healing well  Lab Results: No results for input(s): "WBC", "HGB", "HCT", "PLT" in the last 72 hours. BMET: No results for input(s): "NA", "K", "CL", "CO2", "GLUCOSE", "BUN", "CREATININE", "CALCIUM" in the last 72 hours.  PT/INR: No results for input(s): "LABPROT", "INR" in the last 72 hours. ABG    Component Value Date/Time   PHART 7.450 05/09/2022 0824   HCO3 25.8 05/09/2022 0824   TCO2 27 05/09/2022 0824   ACIDBASEDEF 0.3 04/06/2022 0332   O2SAT 100 05/09/2022 0824   CBG (last 3)  Recent Labs    05/31/22 2331 06/01/22 0340 06/01/22 0740  GLUCAP 115* 136* 147*    Meds Scheduled Meds:  amoxicillin-clavulanate  872 mg Per Tube Q12H   Chlorhexidine Gluconate Cloth  6 each Topical Daily   cloNIDine  0.1 mg Transdermal Weekly   enoxaparin (LOVENOX) injection  40 mg Subcutaneous Daily   feeding supplement (OSMOLITE 1.5 CAL)  1,710 mL Per Tube Q24H   free water  200 mL Per Tube Q4H   hydrALAZINE  20 mg Intravenous  Q8H   insulin aspart  0-24 Units Subcutaneous Q4H   liver oil-zinc oxide   Topical BID   olopatadine  1 drop Both Eyes BID   mouth rinse  15 mL Mouth Rinse 4 times per day   pantoprazole (PROTONIX) IV  40 mg Intravenous Q12H   potassium chloride  20 mEq Per Tube Daily   sodium chloride flush  10-40 mL Intracatheter Q12H   Continuous Infusions:  chlorproMAZINE (THORAZINE) 12.5 mg in sodium chloride 0.9 % 25 mL IVPB 12.5 mg (05/27/22 1301)   PRN Meds:.acetaminophen (TYLENOL) oral liquid 160 mg/5 mL, chlorproMAZINE (THORAZINE) 12.5 mg in sodium chloride 0.9 % 25 mL IVPB, guaiFENesin, HYDROcodone-acetaminophen, morphine injection, ondansetron (ZOFRAN) IV, mouth rinse, sodium chloride flush  Xrays No results found.  Assessment/Plan: S/P Procedure(s) (LRB): LAPAROSCOPIC JEJUNOSTOMY (N/A) ESOPHAGOGASTRODUODENOSCOPY (EGD) (N/A) VIDEO ASSISTED THORACOSCOPY (Right)  1 afeb , VSS s BP 120- 150's range - cont current BP meds 2 sats good on RA 3 excellent UOP 4 no drainage recorded from drain 5 no new labs or CXR 6 [facility], otherwise stable for d/c   LOS: 27 days    [name] PA-C Pager [phone] 06/01/2022

## 2022-06-02 LAB — COMPREHENSIVE METABOLIC PANEL
ALT: 54 U/L — ABNORMAL HIGH (ref 0–44)
AST: 43 U/L — ABNORMAL HIGH (ref 15–41)
Albumin: 2.8 g/dL — ABNORMAL LOW (ref 3.5–5.0)
Alkaline Phosphatase: 123 U/L (ref 38–126)
Anion gap: 7 (ref 5–15)
BUN: 24 mg/dL — ABNORMAL HIGH (ref 8–23)
CO2: 23 mmol/L (ref 22–32)
Calcium: 9.2 mg/dL (ref 8.9–10.3)
Chloride: 114 mmol/L — ABNORMAL HIGH (ref 98–111)
Creatinine, Ser: 1.27 mg/dL — ABNORMAL HIGH (ref 0.61–1.24)
GFR, Estimated: >60 mL/min (ref >60)
Glucose, Bld: 184 mg/dL — ABNORMAL HIGH (ref 70–99)
Potassium: 4.5 mmol/L (ref 3.5–5.1)
Sodium: 144 mmol/L (ref 135–145)
Total Bilirubin: 0.3 mg/dL (ref 0.3–1.2)
Total Protein: 6.4 g/dL — ABNORMAL LOW (ref 6.5–8.1)

## 2022-06-02 LAB — GLUCOSE, CAPILLARY
Glucose-Capillary: 151 mg/dL — ABNORMAL HIGH (ref 70–99)
Glucose-Capillary: 163 mg/dL — ABNORMAL HIGH (ref 70–99)
Glucose-Capillary: 123 mg/dL — ABNORMAL HIGH (ref 70–99)
Glucose-Capillary: 116 mg/dL — ABNORMAL HIGH (ref 70–99)
Glucose-Capillary: 141 mg/dL — ABNORMAL HIGH (ref 70–99)
Glucose-Capillary: 119 mg/dL — ABNORMAL HIGH (ref 70–99)

## 2022-06-02 MED ORDER — METFORMIN HCL 500 MG/5ML PO SOLN: 5.0000 mL | Freq: Two times a day (BID) | ORAL | 3 refills | Status: DC

## 2022-06-02 MED ORDER — METOPROLOL TARTRATE 25 MG/10 ML ORAL SUSPENSION: 12.5000 mg | Freq: Two times a day (BID) | ORAL | 3 refills | Status: DC

## 2022-06-02 NOTE — Plan of Care (Signed)
  Problem: Education: Goal: Knowledge of General Education information will improve Description: Including pain rating scale, medication(s)/side effects and non-pharmacologic comfort measures Outcome: Progressing   Problem: Health Behavior/Discharge Planning: Goal: Ability to manage health-related needs will improve Outcome: Progressing   Problem: Clinical Measurements: Goal: Ability to maintain clinical measurements within normal limits will improve Outcome: Progressing Goal: Will remain free from infection Outcome: Progressing Goal: Diagnostic test results will improve Outcome: Progressing Goal: Respiratory complications will improve Outcome: Progressing Goal: Cardiovascular complication will be avoided Outcome: Progressing   Problem: Activity: Goal: Risk for activity intolerance will decrease Outcome: Progressing   Problem: Nutrition: Goal: Adequate nutrition will be maintained Outcome: Progressing   Problem: Coping: Goal: Level of anxiety will decrease Outcome: Progressing   Problem: Pain Managment: Goal: General experience of comfort will improve Outcome: Progressing   Problem: Safety: Goal: Ability to remain free from injury will improve Outcome: Progressing   Problem: Skin Integrity: Goal: Risk for impaired skin integrity will decrease Outcome: Progressing   Problem: Education: Goal: Knowledge of the prescribed therapeutic regimen will improve Outcome: Progressing   Problem: Bowel/Gastric: Goal: Gastrointestinal status for postoperative course will improve Outcome: Progressing   Problem: Nutritional: Goal: Ability to achieve adequate nutritional intake will improve Outcome: Progressing   Problem: Clinical Measurements: Goal: Postoperative complications will be avoided or minimized Outcome: Progressing   Problem: Respiratory: Goal: Ability to maintain a clear airway will improve Outcome: Progressing

## 2022-06-02 NOTE — Progress Notes (Signed)
Patients wife from [place] to pick up meds-  The following meds were filled at the [facility] today 11/24  Hydrocodone liquid Clonidine patch Augmentin   The following med will be available for pick up on Monday 11/27: Metformin   Metoprolol suspension is not commercially available at this time and [facility] has suggested to have this filled at the hospital pharmacy.

## 2022-06-02 NOTE — Progress Notes (Signed)
      [address]       [place] [address]             [phone]       [facility]  They need to order Lopressor suspension and Metformin suspension.  The patient will be able to be discharged home once these medications arrive.  Ellwood Handler, PA-C

## 2022-06-02 NOTE — Plan of Care (Signed)
  Problem: Education: Goal: Knowledge of General Education information will improve Description: Including pain rating scale, medication(s)/side effects and non-pharmacologic comfort measures Outcome: Progressing   Problem: Health Behavior/Discharge Planning: Goal: Ability to manage health-related needs will improve Outcome: Progressing   Problem: Clinical Measurements: Goal: Ability to maintain clinical measurements within normal limits will improve Outcome: Progressing Goal: Will remain free from infection Outcome: Progressing Goal: Diagnostic test results will improve Outcome: Progressing Goal: Respiratory complications will improve Outcome: Progressing Goal: Cardiovascular complication will be avoided Outcome: Progressing   Problem: Activity: Goal: Risk for activity intolerance will decrease Outcome: Progressing   Problem: Nutrition: Goal: Adequate nutrition will be maintained Outcome: Progressing   Problem: Coping: Goal: Level of anxiety will decrease Outcome: Progressing   Problem: Elimination: Goal: Will not experience complications related to bowel motility Outcome: Progressing Goal: Will not experience complications related to urinary retention Outcome: Progressing   Problem: Pain Managment: Goal: General experience of comfort will improve Outcome: Progressing   Problem: Safety: Goal: Ability to remain free from injury will improve Outcome: Progressing   Problem: Skin Integrity: Goal: Risk for impaired skin integrity will decrease Outcome: Progressing   Problem: Education: Goal: Knowledge of the prescribed therapeutic regimen will improve Outcome: Progressing   Problem: Bowel/Gastric: Goal: Gastrointestinal status for postoperative course will improve Outcome: Progressing   Problem: Nutritional: Goal: Ability to achieve adequate nutritional intake will improve Outcome: Progressing   Problem: Clinical Measurements: Goal: Postoperative  complications will be avoided or minimized Outcome: Progressing   Problem: Skin Integrity: Goal: Demonstration of wound healing without infection will improve Outcome: Progressing

## 2022-06-02 NOTE — Progress Notes (Addendum)
Mobility Specialist Progress Note:   06/02/22 0958  Mobility  Activity Ambulated with assistance in hallway  Level of Assistance Modified independent, requires aide device or extra time  Assistive Device Four wheel walker  Distance Ambulated (ft) 450 ft  Activity Response Tolerated well  $Mobility charge 1 Mobility   Pre- Mobility:  93% SpO2 During Mobility: 120 HR Post Mobility:   101 HR; 95% SpO2  Pt received in chair willing to participate in mobility. No complaints of pain. Left in chair with call bell in reach and all needs met.   Keith Moreno Mobility Specialist Please contact via [employer] or  Rehab Office at [phone]

## 2022-06-02 NOTE — Progress Notes (Signed)
      [address]       [place] [address]             [phone]       24 Days Post-Op Procedure(s) (LRB): LAPAROSCOPIC JEJUNOSTOMY (N/A) ESOPHAGOGASTRODUODENOSCOPY (EGD) (N/A) VIDEO ASSISTED THORACOSCOPY (Right)  Subjective:  Patient remains stable, home supplies have been delivered.  [facility] stated [facility].  These have now been sent in twice  Objective: Vital signs in last 24 hours: Temp:  [98 F (36.7 C)-98.1 F (36.7 C)] 98.1 F (36.7 C) (11/24 0444) Pulse Rate:  [80-105] 105 (11/24 0445) Cardiac Rhythm: Normal sinus rhythm;Bundle branch block (11/24 0721) Resp:  [15-19] 19 (11/24 0445) BP: (131-148)/(75-87) 148/75 (11/24 0444) SpO2:  [94 %-95 %] 95 % (11/24 0445)  Intake/Output from previous day: 11/23 0701 - 11/24 0700 In: 417.7 [NG/GT:297.7] Out: 47 [Urine:400; Drains:20]  General appearance: alert, cooperative, and no distress Heart: regular rate and rhythm Lungs: clear to auscultation bilaterally Abdomen: soft, non-tender; bowel sounds normal; no masses,  no organomegaly Extremities: extremities normal, atraumatic, no cyanosis or edema Wound: clean and dry  Lab Results: No results for input(s): "WBC", "HGB", "HCT", "PLT" in the last 72 hours. BMET: No results for input(s): "NA", "K", "CL", "CO2", "GLUCOSE", "BUN", "CREATININE", "CALCIUM" in the last 72 hours.  PT/INR: No results for input(s): "LABPROT", "INR" in the last 72 hours. ABG    Component Value Date/Time   PHART 7.450 05/09/2022 0824   HCO3 25.8 05/09/2022 0824   TCO2 27 05/09/2022 0824   ACIDBASEDEF 0.3 04/06/2022 0332   O2SAT 100 05/09/2022 0824   CBG (last 3)  Recent Labs    06/01/22 1954 06/01/22 2314 06/02/22 0443  GLUCAP 147* 118* 151*    Assessment/Plan: S/P Procedure(s) (LRB): LAPAROSCOPIC JEJUNOSTOMY (N/A) ESOPHAGOGASTRODUODENOSCOPY (EGD) (N/A) VIDEO ASSISTED THORACOSCOPY (Right)  1.CV- remains stable- continue catapres,  hydralazine 2. Pulm- JP output remains low 3. Dispo- patient stable, for discharge, [facility], I will reach out to pharmacy this morning when it opens   LOS: 28 days    Ellwood Handler, PA-C 06/02/2022

## 2022-06-03 LAB — GLUCOSE, CAPILLARY
Glucose-Capillary: 153 mg/dL — ABNORMAL HIGH (ref 70–99)
Glucose-Capillary: 165 mg/dL — ABNORMAL HIGH (ref 70–99)
Glucose-Capillary: 122 mg/dL — ABNORMAL HIGH (ref 70–99)
Glucose-Capillary: 79 mg/dL (ref 70–99)
Glucose-Capillary: 94 mg/dL (ref 70–99)
Glucose-Capillary: 152 mg/dL — ABNORMAL HIGH (ref 70–99)

## 2022-06-03 NOTE — Plan of Care (Signed)
  Problem: Education: Goal: Knowledge of General Education information will improve Description: Including pain rating scale, medication(s)/side effects and non-pharmacologic comfort measures Outcome: Progressing   Problem: Health Behavior/Discharge Planning: Goal: Ability to manage health-related needs will improve Outcome: Progressing   Problem: Clinical Measurements: Goal: Ability to maintain clinical measurements within normal limits will improve Outcome: Progressing Goal: Will remain free from infection Outcome: Progressing Goal: Diagnostic test results will improve Outcome: Progressing Goal: Respiratory complications will improve Outcome: Progressing Goal: Cardiovascular complication will be avoided Outcome: Progressing   Problem: Activity: Goal: Risk for activity intolerance will decrease Outcome: Progressing   Problem: Nutrition: Goal: Adequate nutrition will be maintained Outcome: Progressing   Problem: Coping: Goal: Level of anxiety will decrease Outcome: Progressing   Problem: Elimination: Goal: Will not experience complications related to bowel motility Outcome: Progressing Goal: Will not experience complications related to urinary retention Outcome: Progressing   Problem: Safety: Goal: Ability to remain free from injury will improve Outcome: Progressing   Problem: Skin Integrity: Goal: Risk for impaired skin integrity will decrease Outcome: Progressing   Problem: Education: Goal: Knowledge of the prescribed therapeutic regimen will improve Outcome: Progressing   Problem: Bowel/Gastric: Goal: Gastrointestinal status for postoperative course will improve Outcome: Progressing   Problem: Nutritional: Goal: Ability to achieve adequate nutritional intake will improve Outcome: Progressing   Problem: Clinical Measurements: Goal: Postoperative complications will be avoided or minimized Outcome: Progressing   Problem: Respiratory: Goal: Ability to  maintain a clear airway will improve Outcome: Progressing   Problem: Skin Integrity: Goal: Demonstration of wound healing without infection will improve Outcome: Progressing

## 2022-06-03 NOTE — Progress Notes (Signed)
[address]       [place] [address]             [phone]     25 Days Post-Op Procedure(s) (LRB): LAPAROSCOPIC JEJUNOSTOMY (N/A) ESOPHAGOGASTRODUODENOSCOPY (EGD) (N/A) VIDEO ASSISTED THORACOSCOPY (Right) Subjective: Feels okay  Objective: Vital signs in last 24 hours: Temp:  [97.9 F (36.6 C)-98.5 F (36.9 C)] 98.1 F (36.7 C) (11/25 0727) Pulse Rate:  [82-97] 89 (11/25 0426) Cardiac Rhythm: Normal sinus rhythm;Bundle branch block (11/25 0727) Resp:  [14-20] 16 (11/25 0426) BP: (127-175)/(73-94) 175/85 (11/25 0727) SpO2:  [93 %-95 %] 95 % (11/25 0426)  Hemodynamic parameters for last 24 hours:    Intake/Output from previous day: 11/24 0701 - 11/25 0700 In: 3293.3 [NG/GT:3293.3] Out: 0  Intake/Output this shift: No intake/output data recorded.  General appearance: alert, cooperative, and no distress Heart: regular rate and rhythm Lungs: clear to auscultation bilaterally Abdomen: Obese, benign Extremities: Minor edema Wound: Incisions well-healed  Lab Results: No results for input(s): "WBC", "HGB", "HCT", "PLT" in the last 72 hours. BMET:  Recent Labs    06/02/22 1030  NA 144  K 4.5  CL 114*  CO2 23  GLUCOSE 184*  BUN 24*  CREATININE 1.27*  CALCIUM 9.2    PT/INR: No results for input(s): "LABPROT", "INR" in the last 72 hours. ABG    Component Value Date/Time   PHART 7.450 05/09/2022 0824   HCO3 25.8 05/09/2022 0824   TCO2 27 05/09/2022 0824   ACIDBASEDEF 0.3 04/06/2022 0332   O2SAT 100 05/09/2022 0824   CBG (last 3)  Recent Labs    06/02/22 2301 06/03/22 0426 06/03/22 0743  GLUCAP 119* 153* 165*    Meds Scheduled Meds:  amoxicillin-clavulanate  872 mg Per Tube Q12H   Chlorhexidine Gluconate Cloth  6 each Topical Daily   cloNIDine  0.1 mg Transdermal Weekly   enoxaparin (LOVENOX) injection  40 mg Subcutaneous Daily   feeding supplement (OSMOLITE 1.5 CAL)  1,710 mL Per Tube Q24H   free water  200 mL Per Tube  Q4H   hydrALAZINE  20 mg Intravenous Q8H   insulin aspart  0-24 Units Subcutaneous Q4H   liver oil-zinc oxide   Topical BID   olopatadine  1 drop Both Eyes BID   mouth rinse  15 mL Mouth Rinse 4 times per day   pantoprazole (PROTONIX) IV  40 mg Intravenous Q12H   potassium chloride  20 mEq Per Tube Daily   sodium chloride flush  10-40 mL Intracatheter Q12H   Continuous Infusions:  chlorproMAZINE (THORAZINE) 12.5 mg in sodium chloride 0.9 % 25 mL IVPB 50 mL/hr at 06/03/22 0541   PRN Meds:.acetaminophen (TYLENOL) oral liquid 160 mg/5 mL, chlorproMAZINE (THORAZINE) 12.5 mg in sodium chloride 0.9 % 25 mL IVPB, guaiFENesin, HYDROcodone-acetaminophen, morphine injection, ondansetron (ZOFRAN) IV, mouth rinse, sodium chloride flush  Xrays No results found.  Assessment/Plan: S/P Procedure(s) (LRB): LAPAROSCOPIC JEJUNOSTOMY (N/A) ESOPHAGOGASTRODUODENOSCOPY (EGD) (N/A) VIDEO ASSISTED THORACOSCOPY (Right)   1 afebrile, vital stable but some hypertensive readings he would be difficult to change medications at this point due to difficulty with obtaining them at [facility], continue to observe closely 2 oxygen saturations are good on room air 3 no new labs today 4 blood sugars reasonable control 5 we will ask for pharmacy consult to see if there is anything we can do from a [facility] standpoint to assist with obtaining medication so the patient can be discharged.   LOS: 29  days    [name] PA-C Pager [phone] 06/03/2022

## 2022-06-03 NOTE — Progress Notes (Signed)
OT Cancellation Note  Patient Details Name: Amillion Macchia MRN: [id number] DOB: 10-Jan-1961   Cancelled Treatment:    Reason Eval/Treat Not Completed: Patient declined, no reason specified (Pt reports headache, OT offered to come back later this date after pain medication. Pt continued to decline.)  Elliot Cousin 06/03/2022, 2:22 PM

## 2022-06-04 LAB — GLUCOSE, CAPILLARY
Glucose-Capillary: 134 mg/dL — ABNORMAL HIGH (ref 70–99)
Glucose-Capillary: 135 mg/dL — ABNORMAL HIGH (ref 70–99)
Glucose-Capillary: 143 mg/dL — ABNORMAL HIGH (ref 70–99)
Glucose-Capillary: 150 mg/dL — ABNORMAL HIGH (ref 70–99)
Glucose-Capillary: 158 mg/dL — ABNORMAL HIGH (ref 70–99)
Glucose-Capillary: 159 mg/dL — ABNORMAL HIGH (ref 70–99)
Glucose-Capillary: 80 mg/dL (ref 70–99)

## 2022-06-04 NOTE — Plan of Care (Signed)
  Problem: Education: Goal: Knowledge of General Education information will improve Description: Including pain rating scale, medication(s)/side effects and non-pharmacologic comfort measures Outcome: Progressing   Problem: Health Behavior/Discharge Planning: Goal: Ability to manage health-related needs will improve Outcome: Progressing   Problem: Clinical Measurements: Goal: Ability to maintain clinical measurements within normal limits will improve Outcome: Progressing Goal: Will remain free from infection Outcome: Progressing Goal: Diagnostic test results will improve Outcome: Progressing Goal: Respiratory complications will improve Outcome: Progressing Goal: Cardiovascular complication will be avoided Outcome: Progressing   Problem: Activity: Goal: Risk for activity intolerance will decrease Outcome: Progressing   Problem: Nutrition: Goal: Adequate nutrition will be maintained Outcome: Progressing   Problem: Coping: Goal: Level of anxiety will decrease Outcome: Progressing   Problem: Elimination: Goal: Will not experience complications related to bowel motility Outcome: Progressing Goal: Will not experience complications related to urinary retention Outcome: Progressing   Problem: Pain Managment: Goal: General experience of comfort will improve Outcome: Progressing   Problem: Safety: Goal: Ability to remain free from injury will improve Outcome: Progressing   Problem: Skin Integrity: Goal: Risk for impaired skin integrity will decrease Outcome: Progressing   Problem: Education: Goal: Knowledge of the prescribed therapeutic regimen will improve Outcome: Progressing   Problem: Bowel/Gastric: Goal: Gastrointestinal status for postoperative course will improve Outcome: Progressing   Problem: Nutritional: Goal: Ability to achieve adequate nutritional intake will improve Outcome: Progressing   Problem: Clinical Measurements: Goal: Postoperative  complications will be avoided or minimized Outcome: Progressing   Problem: Respiratory: Goal: Ability to maintain a clear airway will improve Outcome: Progressing   Problem: Skin Integrity: Goal: Demonstration of wound healing without infection will improve Outcome: Progressing

## 2022-06-04 NOTE — Progress Notes (Signed)
      [address]       [place] [address]             [phone]     26 Days Post-Op Procedure(s) (LRB): LAPAROSCOPIC JEJUNOSTOMY (N/A) ESOPHAGOGASTRODUODENOSCOPY (EGD) (N/A) VIDEO ASSISTED THORACOSCOPY (Right) Subjective: Remains very stable  Objective: Vital signs in last 24 hours: Temp:  [98.1 F (36.7 C)-98.5 F (36.9 C)] 98.4 F (36.9 C) (11/26 0721) Pulse Rate:  [82-103] 103 (11/26 0721) Cardiac Rhythm: Junctional rhythm;Bundle branch block (11/26 0718) Resp:  [15-18] 17 (11/26 0721) BP: (131-145)/(60-79) 131/60 (11/26 0721) SpO2:  [93 %-95 %] 93 % (11/26 0721)  Hemodynamic parameters for last 24 hours:    Intake/Output from previous day: 11/25 0701 - 11/26 0700 In: 0  Out: 700 [Urine:700] Intake/Output this shift: No intake/output data recorded.  General appearance: alert, cooperative, and no distress Heart: regular rate and rhythm Lungs: clear to auscultation bilaterally Abdomen: benign Extremities: WWP Wound: healing well  Lab Results: No results for input(s): "WBC", "HGB", "HCT", "PLT" in the last 72 hours. BMET:  Recent Labs    06/02/22 1030  NA 144  K 4.5  CL 114*  CO2 23  GLUCOSE 184*  BUN 24*  CREATININE 1.27*  CALCIUM 9.2    PT/INR: No results for input(s): "LABPROT", "INR" in the last 72 hours. ABG    Component Value Date/Time   PHART 7.450 05/09/2022 0824   HCO3 25.8 05/09/2022 0824   TCO2 27 05/09/2022 0824   ACIDBASEDEF 0.3 04/06/2022 0332   O2SAT 100 05/09/2022 0824   CBG (last 3)  Recent Labs    06/04/22 0030 06/04/22 0418 06/04/22 0724  GLUCAP 135* 143* 158*    Meds Scheduled Meds:  amoxicillin-clavulanate  872 mg Per Tube Q12H   Chlorhexidine Gluconate Cloth  6 each Topical Daily   cloNIDine  0.1 mg Transdermal Weekly   enoxaparin (LOVENOX) injection  40 mg Subcutaneous Daily   feeding supplement (OSMOLITE 1.5 CAL)  1,710 mL Per Tube Q24H   free water  200 mL Per Tube Q4H   hydrALAZINE  20  mg Intravenous Q8H   insulin aspart  0-24 Units Subcutaneous Q4H   liver oil-zinc oxide   Topical BID   olopatadine  1 drop Both Eyes BID   mouth rinse  15 mL Mouth Rinse 4 times per day   pantoprazole (PROTONIX) IV  40 mg Intravenous Q12H   potassium chloride  20 mEq Per Tube Daily   sodium chloride flush  10-40 mL Intracatheter Q12H   Continuous Infusions:  chlorproMAZINE (THORAZINE) 12.5 mg in sodium chloride 0.9 % 25 mL IVPB 50 mL/hr at 06/04/22 0414   PRN Meds:.acetaminophen (TYLENOL) oral liquid 160 mg/5 mL, chlorproMAZINE (THORAZINE) 12.5 mg in sodium chloride 0.9 % 25 mL IVPB, guaiFENesin, HYDROcodone-acetaminophen, morphine injection, ondansetron (ZOFRAN) IV, mouth rinse, sodium chloride flush  Xrays No results found.  Assessment/Plan: S/P Procedure(s) (LRB): LAPAROSCOPIC JEJUNOSTOMY (N/A) ESOPHAGOGASTRODUODENOSCOPY (EGD) (N/A) VIDEO ASSISTED THORACOSCOPY (Right)  1 afebrile, VSS, BP control is pretty good, has not been getting clonidine patch- will be placed today 2 sats good on RA 3 no significant changes, Wife can do J tube meds but is uncomfortable with any that would need to be crushed. They have been ordered so hopefully will have at [place] to [facility] so can finally be discharged     LOS: 30 days    [name] 06/04/2022

## 2022-06-05 ENCOUNTER — Other Ambulatory Visit (HOSPITAL_COMMUNITY): Payer: Self-pay

## 2022-06-05 ENCOUNTER — Other Ambulatory Visit: Payer: Self-pay | Admitting: Surgical

## 2022-06-05 LAB — GLUCOSE, CAPILLARY
Glucose-Capillary: 182 mg/dL — ABNORMAL HIGH (ref 70–99)
Glucose-Capillary: 112 mg/dL — ABNORMAL HIGH (ref 70–99)

## 2022-06-05 MED ORDER — PANTOPRAZOLE SODIUM 40 MG PO PACK: 40.0000 mg | PACK | Freq: Two times a day (BID) | ORAL | 3 refills | Status: DC

## 2022-06-05 MED ORDER — HEPARIN SOD (PORK) LOCK FLUSH 100 UNIT/ML IV SOLN
500.0000 [IU] | INTRAVENOUS | Status: AC | PRN
  Administered 2022-06-05: 500 [IU]

## 2022-06-05 MED ORDER — LANSOPRAZOLE 30 MG PO CPDR: 30.0000 mg | DELAYED_RELEASE_CAPSULE | Freq: Two times a day (BID) | ORAL | 1 refills | Status: DC

## 2022-06-05 NOTE — Progress Notes (Signed)
A RX for prevacid was sent to [facility] in [place] as they dont have protonix   will need to be sprinkled into water and flushed through J tube same as other meds  [name], PA-C

## 2022-06-05 NOTE — Progress Notes (Signed)
Mobility Specialist Progress Note    06/05/22 0907  Mobility  Activity Ambulated independently in hallway  Level of Assistance Modified independent, requires aide device or extra time  Assistive Device Four wheel walker  Distance Ambulated (ft) 450 ft  Activity Response Tolerated well  Mobility Referral Yes  $Mobility charge 1 Mobility   Pre-Mobility: 87 HR, 97% SpO2 Post-Mobility: 92 HR, 96% SpO2  Pt received in bed and agreeable. Attempted BM in BR, unsuccessful. No complaints on walk. Returned to chair with call bell in reach.    Hildred Alamin Mobility Specialist  Please Psychologist, sport and exercise or Rehab Office at [phone]

## 2022-06-05 NOTE — Progress Notes (Addendum)
      [address]       [place] [address]             [phone]     27 Days Post-Op Procedure(s) (LRB): LAPAROSCOPIC JEJUNOSTOMY (N/A) ESOPHAGOGASTRODUODENOSCOPY (EGD) (N/A) VIDEO ASSISTED THORACOSCOPY (Right) Subjective: Mild constipation otherwise doing well  Objective: Vital signs in last 24 hours: Temp:  [97.9 F (36.6 C)-98.2 F (36.8 C)] 98.2 F (36.8 C) (11/27 0435) Pulse Rate:  [78-94] 94 (11/27 0435) Cardiac Rhythm: Heart block (11/27 0700) Resp:  [16-20] 20 (11/27 0435) BP: (134-153)/(62-81) 138/72 (11/27 0435) SpO2:  [93 %-97 %] 93 % (11/27 0435)  Hemodynamic parameters for last 24 hours:    Intake/Output from previous day: 11/26 0701 - 11/27 0700 In: 2422.5 [NG/GT:2422.5] Out: 0  Intake/Output this shift: No intake/output data recorded.  No significant exam  Lab Results: No results for input(s): "WBC", "HGB", "HCT", "PLT" in the last 72 hours. BMET:  Recent Labs    06/02/22 1030  NA 144  K 4.5  CL 114*  CO2 23  GLUCOSE 184*  BUN 24*  CREATININE 1.27*  CALCIUM 9.2    PT/INR: No results for input(s): "LABPROT", "INR" in the last 72 hours. ABG    Component Value Date/Time   PHART 7.450 05/09/2022 0824   HCO3 25.8 05/09/2022 0824   TCO2 27 05/09/2022 0824   ACIDBASEDEF 0.3 04/06/2022 0332   O2SAT 100 05/09/2022 0824   CBG (last 3)  Recent Labs    06/04/22 1936 06/04/22 2345 06/05/22 0434  GLUCAP 150* 134* 182*    Meds Scheduled Meds:  amoxicillin-clavulanate  872 mg Per Tube Q12H   Chlorhexidine Gluconate Cloth  6 each Topical Daily   cloNIDine  0.1 mg Transdermal Weekly   enoxaparin (LOVENOX) injection  40 mg Subcutaneous Daily   feeding supplement (OSMOLITE 1.5 CAL)  1,710 mL Per Tube Q24H   free water  200 mL Per Tube Q4H   hydrALAZINE  20 mg Intravenous Q8H   insulin aspart  0-24 Units Subcutaneous Q4H   liver oil-zinc oxide   Topical BID   olopatadine  1 drop Both Eyes BID   mouth rinse  15 mL Mouth  Rinse 4 times per day   pantoprazole (PROTONIX) IV  40 mg Intravenous Q12H   potassium chloride  20 mEq Per Tube Daily   sodium chloride flush  10-40 mL Intracatheter Q12H   Continuous Infusions:  chlorproMAZINE (THORAZINE) 12.5 mg in sodium chloride 0.9 % 25 mL IVPB 50 mL/hr at 06/05/22 0544   PRN Meds:.acetaminophen (TYLENOL) oral liquid 160 mg/5 mL, chlorproMAZINE (THORAZINE) 12.5 mg in sodium chloride 0.9 % 25 mL IVPB, guaiFENesin, HYDROcodone-acetaminophen, morphine injection, ondansetron (ZOFRAN) IV, mouth rinse, sodium chloride flush  Xrays No results found.  Assessment/Plan: S/P Procedure(s) (LRB): LAPAROSCOPIC JEJUNOSTOMY (N/A) ESOPHAGOGASTRODUODENOSCOPY (EGD) (N/A) VIDEO ASSISTED THORACOSCOPY (Right)  1 afebrile, blood pressure is adequately controlled in the 130-150 range,  think this will be adequate to liquid metoprolol can be obtained from pharmacy. 2 oxygen saturations are adequate on room air Tube feedings have been arranged 3 wife thinks that he can get the metformin this morning. 4 he is stable for discharge      LOS: 31 days    Keith Moreno 06/05/2022   Agree with above Dispo planning 4weeks out from stent placement  Keith Moreno O Keith Moreno

## 2022-06-07 ENCOUNTER — Telehealth: Payer: Self-pay | Admitting: *Deleted

## 2022-06-07 NOTE — Telephone Encounter (Signed)
Patient's HHRN, Alyse, contacted the office to clarify orders. It was ordered for patient to receive 51m NS flushes every 8 hrs via Jtube. Patient is getting 2063mof water every 4 hours in between tube feedings. RN also clarifying whether or not JP drain is to be pressurized or not. Per W.Evonnie PatPA, RN advised that 2003mvery 4hrs is enough for flushing and the 52m16m flushes are not needed. Also advised that JP drain is to remain open, NOT pressurized. RN verbalized understanding.

## 2022-06-08 ENCOUNTER — Telehealth: Payer: Self-pay

## 2022-06-08 NOTE — Telephone Encounter (Signed)
Keith Moreno, HHPT with [facility] contacted the office to state that he was in the home with patient today. He did do his evaluation. During the evaluation, patient did have a fall. States that he stood up his knees buckled and did have to call EMS to help get him up. Patient did not sustain any injuries or breaks in the skin. States that there was no gait belt used due to tubing. He just wanted to make sure the office was aware. He also states that he is using sublingual Zofran but he is supposed to be NPO with feed tube usage. Wanted clarification as to if he should be using it by mouth. Advised that [name] would be made aware.

## 2022-06-09 ENCOUNTER — Ambulatory Visit (INDEPENDENT_AMBULATORY_CARE_PROVIDER_SITE_OTHER): Payer: Self-pay | Admitting: Thoracic Surgery (Cardiothoracic Vascular Surgery)

## 2022-06-09 ENCOUNTER — Encounter: Payer: Self-pay | Admitting: Thoracic Surgery (Cardiothoracic Vascular Surgery)

## 2022-06-09 VITALS — BP 147/96 | HR 80 | Resp 20 | Ht 72.0 in | Wt 317.0 lb

## 2022-06-09 DIAGNOSIS — Z9889 Other specified postprocedural states: Secondary | ICD-10-CM

## 2022-06-09 DIAGNOSIS — Z9049 Acquired absence of other specified parts of digestive tract: Secondary | ICD-10-CM

## 2022-06-09 NOTE — Progress Notes (Signed)
      [address]       [place] [address]             [phone]        Charleton Sells [facility] Medical Record #[id number] Date of Birth: 03/01/1961  Referring: [facility] Care: [facility] Cardiologist:None  Reason for visit:   follow-up  History of Present Illness:     61 year old male presents for his first follow-up appointment.  He underwent a robotic assisted Ivor Lewis esophagectomy that was complicated by an anastomotic leak.  Since being home he continues to to have his tube feeds and received physical therapy.  He has minimal drain output.  Physical Exam: BP (!) 147/96 (BP Location: Left Arm, Patient Position: Sitting, Cuff Size: Large)   Pulse 80   Resp 20   Ht 6' (1.829 m)   Wt (!) 317 lb (143.8 kg)   SpO2 94% Comment: RA  BMI 42.99 kg/m   Alert NAD Incision clean.  Serous output of the drain Abdomen, ND J-tube site clear No peripheral edema       Assessment / Plan:   61 year old male status post Ivor Lewis esophagectomy complicated by anastomotic leak.  This is since been covered by the stent and he has minimal output in his drain.  He appears quite well.  Have instructed him that he can start swallowing ice chips.  I will check back with him in 1 week.   Lajuana Matte 06/09/2022 3:03 PM

## 2022-06-14 ENCOUNTER — Telehealth: Payer: Self-pay | Admitting: *Deleted

## 2022-06-14 NOTE — Telephone Encounter (Signed)
Patient's home health RN, April, contacted the office stating patient has not been taking his metoprolol as his pharmacy, [facility], does not compound medications. Recent BP's 140/86, 140/84, 140/88, 80/50, 112/60. Patient remains on a clonidine patch. Called and spoke with patient. Per E. Barrett, PA, patient advised to not take the metoprolol at this time. Advised patient to contact our office if his BP begins to raise as his clonidine patch dosage may need to be adjusted at that time. Patient states when he coughs he feels as if a stitch is tickling this throat. Patient states he is coughing up clear phlegm. Patient asked how long he will be on many of these medications stating he is almost out and the [place] does not accept already prescribed refills from non [facility]. Advised patient he may ask for additional refills as needed tomorrow during his telephone visit with [name]. Patient verbalized understanding.

## 2022-06-15 ENCOUNTER — Ambulatory Visit (INDEPENDENT_AMBULATORY_CARE_PROVIDER_SITE_OTHER): Payer: Self-pay | Admitting: Thoracic Surgery (Cardiothoracic Vascular Surgery)

## 2022-06-15 DIAGNOSIS — C159 Malignant neoplasm of esophagus, unspecified: Secondary | ICD-10-CM

## 2022-06-15 NOTE — Progress Notes (Signed)
     [address]       [place] [address]             [phone]       Patient: Home Provider: Office Consent for Telemedicine visit obtained.  Today's visit was completed via a real-time telehealth (see specific modality noted below). The patient/authorized person provided oral consent at the time of the visit to engage in a telemedicine encounter with the present provider at [facility]. The patient/authorized person was informed of the potential benefits, limitations, and risks of telemedicine. The patient/authorized person expressed understanding that the laws that protect confidentiality also apply to telemedicine. The patient/authorized person acknowledged understanding that telemedicine does not provide emergency services and that he or she would need to call 911 or proceed to the nearest hospital for help if such a need arose.   Total time spent in the clinical discussion 10 minutes.  Telehealth Modality: Phone visit (audio only)  I had a telephone visit with Keith Moreno.  He has had minimal drainage from his JP drain.  He has not started taking fluids yet as instructed.  I reiterated that try liquids and check his drain output.

## 2022-06-22 ENCOUNTER — Ambulatory Visit (INDEPENDENT_AMBULATORY_CARE_PROVIDER_SITE_OTHER): Payer: No Typology Code available for payment source | Admitting: Internal Medicine

## 2022-06-22 ENCOUNTER — Encounter: Payer: Self-pay | Admitting: Internal Medicine

## 2022-06-22 ENCOUNTER — Other Ambulatory Visit: Payer: Self-pay

## 2022-06-22 VITALS — BP 128/89 | HR 98 | Temp 98.0°F | Wt 316.0 lb

## 2022-06-22 DIAGNOSIS — K9189 Other postprocedural complications and disorders of digestive system: Secondary | ICD-10-CM

## 2022-06-22 DIAGNOSIS — C159 Malignant neoplasm of esophagus, unspecified: Secondary | ICD-10-CM

## 2022-06-22 MED ORDER — AMOXICILLIN-POT CLAVULANATE 400-57 MG/5ML PO SUSR: 800.0000 mg | Freq: Two times a day (BID) | ORAL | 0 refills | Status: DC

## 2022-06-22 NOTE — Patient Instructions (Signed)
Continue amox-clav antibiotics until drain is removed   Labs today   See me in last week december

## 2022-06-22 NOTE — Progress Notes (Signed)
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   MRN: [id number]  No chief complaint on file. Cc - esophageal - gastric anastomosis leak  HPI:  Keith Moreno is a 61 y.o. male hx head/neck cancer s/p ivor-lewis esophagectomy complicated by leak requiring stent here for hospital f/u  I saw in [place] 05/30/2022. Patient was follow up and still have leak on esophagogram 11/20 although improved. He was nontoxic at that time on empiric abx piptazo. No cultures. We transitioned to oral amox-clav plan another 4 weks from 11/21  06/22/22 id clinic f/u He saw dr Kipp Brood with [facility] on 12/07 for follow up via televideo visit He was advised to try liquid PO intake to assess for drain leak which was minimal at that time of visit He reports at time coughing quite a bit but no pattern of progression -- cough is sometimes with clear mucus. Not related to intake trial No f/c Some pain around the jp drain insertion site and j-tube; slight brownish discharge at times at jtube No n/v/rash  Stable loose stool with tube feed  Feels well otherwise  He'll see dr Kipp Brood again tomorrow and discuss jp drain removal     Allergies  Allergen Reactions   Procaine     Other reaction(s): Other, Respiratory Distress   Chocolate      Hyperactivity, respiratory distress   Other     Novocaine- respiratory distress      Outpatient Medications Prior to Visit  Medication Sig Dispense Refill   acetaminophen (TYLENOL) 160 MG/5ML solution Place 20.3 mLs (650 mg total) into feeding tube every 6 (six) hours as needed for mild pain or headache. 120 mL 0   amoxicillin-clavulanate (AUGMENTIN) 400-57 MG/5ML suspension Place 10.9 mLs (872 mg total) into feeding tube every 12 (twelve) hours. 620 mL 0   cloNIDine (CATAPRES - DOSED IN MG/24 HR) 0.1 mg/24hr patch Place 1 patch (0.1 mg total) onto the skin once a week. 4 patch 12   guaiFENesin (ROBITUSSIN) 100 MG/5ML liquid Place 15 mLs into feeding tube every 4 (four) hours as needed for cough or to loosen phlegm. 120 mL 0   HYDROcodone-acetaminophen (HYCET) 7.5-325 mg/15 ml solution Place 10 mLs into feeding tube every 6 (six) hours as needed for moderate pain. 280 mL 0   lansoprazole (PREVACID) 30 MG capsule Take 1 capsule (30 mg total) by mouth 2 (two) times daily before a meal. Sprinkle capsule into water and place in JP tube, then flush with more water 30 capsule 1   Metformin HCl 500 MG/5ML SOLN Place 5 mLs (500 mg total) into feeding tube 2 (two) times daily. 300 mL 3   metoprolol tartrate (LOPRESSOR) 25 mg/10 mL SUSP Place 5 mLs (12.5 mg total) into feeding tube 2 (two) times daily. 300 mL  3   Nutritional Supplements (FEEDING SUPPLEMENT, OSMOLITE 1.5 CAL,) LIQD Place 1,710 mLs into feeding tube daily. 60000 mL 3   olopatadine (PATANOL) 0.1 % ophthalmic solution Place 1 drop into both eyes 2 (two) times daily.     ondansetron (ZOFRAN-ODT) 8 MG disintegrating tablet Take 1 tablet (8 mg total) by mouth every 8 (eight) hours as needed for nausea or vomiting. 90 tablet 3   Sodium Chloride Flush (NORMAL SALINE FLUSH) 0.9 % SOLN 30 mLs by Per J Tube route every 8 (eight) hours. 2700 mL 3   Zinc Oxide (TRIPLE PASTE) 12.8 % ointment Apply topically 2 (two) times daily. To J-Tube site 56.7 g 0    No facility-administered medications prior to visit.     Social History   Socioeconomic History   Marital status: Single    Spouse name: Not on file   Number of children: Not on file   Years of education: Not on file   Highest education level: Not on file  Occupational History   Not on file  Tobacco Use   Smoking status: Former    Types: Cigars, Cigarettes, Pipe    Quit date: 37    Years since quitting: 40.9   Smokeless tobacco: Former    Types: Chew    Quit date: 1983  Vaping Use   Vaping Use: Never used  Substance and Sexual Activity   Alcohol use: Yes    Comment: Rarely   Drug use: No   Sexual activity: Yes  Other Topics Concern   Not on file  Social History Narrative   Not on file   Social Determinants of Health   Financial Resource Strain: Not on file  Food Insecurity: No Food Insecurity (05/11/2022)   Hunger Vital Sign    Worried About Running Out of Food in the Last Year: Never true    Ran Out of Food in the Last Year: Never true  Transportation Needs: No Transportation Needs (05/11/2022)   PRAPARE - Hydrologist (Medical): No    Lack of Transportation (Non-Medical): No  Physical Activity: Not on file  Stress: Not on file  Social Connections: Not on file  Intimate Partner Violence: Not At Risk (05/11/2022)   Humiliation, Afraid, Rape, and Kick questionnaire    Fear of Current or Ex-Partner: No    Emotionally Abused: No    Physically Abused: No    Sexually Abused: No      Review of Systems     Objective:    Wt (!) 316 lb (143.3 kg)   BMI 42.86 kg/m  Nursing note and vital signs reviewed.  Physical Exam     General/constitutional: no distress, pleasant HEENT: Normocephalic, PER, Conj Clear, EOMI, Oropharynx clear -- poor dentition Neck supple CV: rrr no mrg Lungs: clear to auscultation, normal respiratory effort; jp drain site no erythema/discharge/fluctuance Abd: Soft, Nontender -- jtube slight brown  discharge but no tenderness/fluctuance/erythema Ext: no edema Skin: No Rash Neuro: nonfocal MSK: no peripheral joint swelling/tenderness/warmth; back spines nontender     Labs:  Micro:  Serology:  Imaging:  Assessment & Plan:   Problem List Items Addressed This Visit       Digestive   Esophageal cancer (HCC) - Primary   Relevant Medications   amoxicillin-clavulanate (AUGMENTIN) 400-57 MG/5ML suspension   Other Relevant Orders   CBC   COMPLETE METABOLIC PANEL WITH GFR   C-reactive protein   Other Visit Diagnoses     Esophagectomy, anastomotic leak  Relevant Orders   CBC   COMPLETE METABOLIC PANEL WITH GFR   C-reactive protein         Lines:  Right chest chemo port   Abx: 11/21-c amox-clav suspension  10/31-11/21 piptazo 10/31-11/13 fluconazole                                                                  Assessment: -Esophageal cancer s/p neoadjuvant chemo -S/p ivor-lewis intrathoracic esophagectomy/placement of j-tube -Esophageogastric anastomosis leak s/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 The chest drain is putting out minimal fluid   We can transition to oral abx and plan for another 4 weeks, to reduce need to assess his chemoport/risk of clabsi  ------------ 12/14 id clinic assessment Improving drainage and doing po intake trial No sign of uncontrolled infection Cough can be of many causes but I do not suspect active pna/empyema/mediastinitis at this time -- he'll see dr Kipp Brood tomorrow and will follow up on plan and advise patient if cough persistent/increasing or f/c let us know Will refill amox-clav for another 4 weeks and stop when drain is removed F/u 2  weeks Labs today  I have spent a total of 20 minutes of face-to-face and non-face-to-face time, excluding clinical staff time, preparing to see patient, ordering tests and/or medications, and provide counseling the patient   Follow-up: Return in about 2 weeks (around 07/06/2022).      Jabier Mutton, [facility] 06/22/2022, 10:52 AM

## 2022-06-23 ENCOUNTER — Ambulatory Visit (INDEPENDENT_AMBULATORY_CARE_PROVIDER_SITE_OTHER): Payer: Self-pay | Admitting: Thoracic Surgery (Cardiothoracic Vascular Surgery)

## 2022-06-23 ENCOUNTER — Other Ambulatory Visit: Payer: Self-pay | Admitting: *Deleted

## 2022-06-23 ENCOUNTER — Other Ambulatory Visit: Payer: Self-pay | Admitting: Thoracic Surgery (Cardiothoracic Vascular Surgery)

## 2022-06-23 DIAGNOSIS — C159 Malignant neoplasm of esophagus, unspecified: Secondary | ICD-10-CM

## 2022-06-23 DIAGNOSIS — Z9049 Acquired absence of other specified parts of digestive tract: Secondary | ICD-10-CM

## 2022-06-23 DIAGNOSIS — Z9889 Other specified postprocedural states: Secondary | ICD-10-CM

## 2022-06-23 LAB — COMPLETE METABOLIC PANEL WITH GFR
AG Ratio: 1.3 (calc) (ref 1.0–2.5)
ALT: 48 U/L — ABNORMAL HIGH (ref 9–46)
AST: 39 U/L — ABNORMAL HIGH (ref 10–35)
Albumin: 3.7 g/dL (ref 3.6–5.1)
Alkaline phosphatase (APISO): 156 U/L — ABNORMAL HIGH (ref 35–144)
BUN: 22 mg/dL (ref 7–25)
CO2: 29 mmol/L (ref 20–32)
Calcium: 9.5 mg/dL (ref 8.6–10.3)
Chloride: 99 mmol/L (ref 98–110)
Creat: 1.02 mg/dL (ref 0.70–1.35)
Globulin: 2.9 g/dL (calc) (ref 1.9–3.7)
Glucose, Bld: 115 mg/dL — ABNORMAL HIGH (ref 65–99)
Potassium: 4.9 mmol/L (ref 3.5–5.3)
Sodium: 136 mmol/L (ref 135–146)
Total Bilirubin: 0.4 mg/dL (ref 0.2–1.2)
Total Protein: 6.6 g/dL (ref 6.1–8.1)
eGFR: 84 mL/min/{1.73_m2} (ref >=60)

## 2022-06-23 LAB — CBC
HCT: 34 % — ABNORMAL LOW (ref 38.5–50.0)
Hemoglobin: 11.1 g/dL — ABNORMAL LOW (ref 13.2–17.1)
MCH: 28.6 pg (ref 27.0–33.0)
MCHC: 32.6 g/dL (ref 32.0–36.0)
MCV: 87.6 fL (ref 80.0–100.0)
MPV: 10.7 fL (ref 7.5–12.5)
Platelets: 262 10*3/uL (ref 140–400)
RBC: 3.88 10*6/uL — ABNORMAL LOW (ref 4.20–5.80)
RDW: 15.4 % — ABNORMAL HIGH (ref 11.0–15.0)
WBC: 5.6 10*3/uL (ref 3.8–10.8)

## 2022-06-23 LAB — C-REACTIVE PROTEIN: CRP: 53.7 mg/L — ABNORMAL HIGH (ref <8.0)

## 2022-06-23 NOTE — Progress Notes (Signed)
     [address]       [place] [address]             [phone]       Patient: Home Provider: Office Consent for Telemedicine visit obtained.  Today's visit was completed via a real-time telehealth (see specific modality noted below). The patient/authorized person provided oral consent at the time of the visit to engage in a telemedicine encounter with the present provider at [facility]. The patient/authorized person was informed of the potential benefits, limitations, and risks of telemedicine. The patient/authorized person expressed understanding that the laws that protect confidentiality also apply to telemedicine. The patient/authorized person acknowledged understanding that telemedicine does not provide emergency services and that he or she would need to call 911 or proceed to the nearest hospital for help if such a need arose.   Total time spent in the clinical discussion 10 minutes.  Telehealth Modality: Phone visit (audio only)  I had a telephone visit with Keith Moreno.  Overall he is doing well.  He is and tolerating a cup of ice with no change in his chest tube output.  I will obtain a CT chest and schedule him for an EGD with stent removal.

## 2022-06-26 ENCOUNTER — Encounter (HOSPITAL_COMMUNITY): Payer: Self-pay | Admitting: Thoracic Surgery (Cardiothoracic Vascular Surgery)

## 2022-06-26 ENCOUNTER — Other Ambulatory Visit: Payer: Self-pay

## 2022-06-26 ENCOUNTER — Ambulatory Visit (HOSPITAL_COMMUNITY)
Admission: RE | Admit: 2022-06-26 | Discharge: 2022-06-26 | Disposition: A | Discharge status: Home / Self Care | Payer: No Typology Code available for payment source | Source: Ambulatory Visit | Attending: Thoracic Surgery (Cardiothoracic Vascular Surgery) | Admitting: Thoracic Surgery (Cardiothoracic Vascular Surgery)

## 2022-06-26 DIAGNOSIS — C159 Malignant neoplasm of esophagus, unspecified: Secondary | ICD-10-CM | POA: Diagnosis present

## 2022-06-26 MED ORDER — IOHEXOL 300 MG/ML  SOLN
75.0000 mL | Freq: Once | INTRAMUSCULAR | Status: AC | PRN
  Administered 2022-06-26: 75 mL via INTRAVENOUS

## 2022-06-26 NOTE — Progress Notes (Signed)
Spoke with pt for pre-op call. Pt denies cardiac history. Pt is treated for HTN and for Esophageal Cancer. Pt has a G-tube. Pt is pre-diabetic, takes Metformin. Last A1C was 5.1 on 01/31/22. Pt was instructed to hold his Metformin 48 hours prior to procedure.  Bath instructions given to pt and he voiced understanding.

## 2022-06-27 ENCOUNTER — Encounter (HOSPITAL_COMMUNITY)
Admission: AD | Disposition: A | Discharge status: Home / Self Care | Payer: Self-pay | Source: Ambulatory Visit | Attending: Thoracic Surgery (Cardiothoracic Vascular Surgery)

## 2022-06-27 ENCOUNTER — Encounter (HOSPITAL_COMMUNITY): Payer: Self-pay | Admitting: Thoracic Surgery (Cardiothoracic Vascular Surgery)

## 2022-06-27 ENCOUNTER — Ambulatory Visit (HOSPITAL_COMMUNITY): Payer: No Typology Code available for payment source | Admitting: Certified Registered Nurse Anesthetist

## 2022-06-27 ENCOUNTER — Inpatient Hospital Stay (HOSPITAL_COMMUNITY)
Admission: AD | Admit: 2022-06-27 | Discharge: 2022-06-29 | DRG: 394 | Disposition: A | Discharge status: Home / Self Care | Payer: No Typology Code available for payment source | Source: Ambulatory Visit | Attending: Thoracic Surgery (Cardiothoracic Vascular Surgery) | Admitting: Thoracic Surgery (Cardiothoracic Vascular Surgery)

## 2022-06-27 ENCOUNTER — Ambulatory Visit (HOSPITAL_COMMUNITY): Payer: No Typology Code available for payment source

## 2022-06-27 DIAGNOSIS — Z8249 Family history of ischemic heart disease and other diseases of the circulatory system: Secondary | ICD-10-CM | POA: Diagnosis not present

## 2022-06-27 DIAGNOSIS — Z9109 Other allergy status, other than to drugs and biological substances: Secondary | ICD-10-CM | POA: Diagnosis not present

## 2022-06-27 DIAGNOSIS — Z9689 Presence of other specified functional implants: Secondary | ICD-10-CM | POA: Diagnosis present

## 2022-06-27 DIAGNOSIS — R7303 Prediabetes: Secondary | ICD-10-CM | POA: Diagnosis present

## 2022-06-27 DIAGNOSIS — I10 Essential (primary) hypertension: Secondary | ICD-10-CM | POA: Diagnosis present

## 2022-06-27 DIAGNOSIS — C159 Malignant neoplasm of esophagus, unspecified: Secondary | ICD-10-CM

## 2022-06-27 DIAGNOSIS — Z7984 Long term (current) use of oral hypoglycemic drugs: Secondary | ICD-10-CM

## 2022-06-27 DIAGNOSIS — Z8052 Family history of malignant neoplasm of bladder: Secondary | ICD-10-CM

## 2022-06-27 DIAGNOSIS — K2289 Other specified disease of esophagus: Secondary | ICD-10-CM | POA: Diagnosis not present

## 2022-06-27 DIAGNOSIS — Z95828 Presence of other vascular implants and grafts: Secondary | ICD-10-CM | POA: Diagnosis not present

## 2022-06-27 DIAGNOSIS — Z833 Family history of diabetes mellitus: Secondary | ICD-10-CM

## 2022-06-27 DIAGNOSIS — Y838 Other surgical procedures as the cause of abnormal reaction of the patient, or of later complication, without mention of misadventure at the time of the procedure: Secondary | ICD-10-CM | POA: Diagnosis present

## 2022-06-27 DIAGNOSIS — Z818 Family history of other mental and behavioral disorders: Secondary | ICD-10-CM | POA: Diagnosis not present

## 2022-06-27 DIAGNOSIS — Z87891 Personal history of nicotine dependence: Secondary | ICD-10-CM

## 2022-06-27 DIAGNOSIS — Z923 Personal history of irradiation: Secondary | ICD-10-CM | POA: Diagnosis not present

## 2022-06-27 DIAGNOSIS — F4310 Post-traumatic stress disorder, unspecified: Secondary | ICD-10-CM | POA: Diagnosis present

## 2022-06-27 DIAGNOSIS — Z6841 Body Mass Index (BMI) 40.0 and over, adult: Secondary | ICD-10-CM | POA: Diagnosis not present

## 2022-06-27 DIAGNOSIS — Z8616 Personal history of COVID-19: Secondary | ICD-10-CM | POA: Diagnosis not present

## 2022-06-27 DIAGNOSIS — Z4659 Encounter for fitting and adjustment of other gastrointestinal appliance and device: Secondary | ICD-10-CM | POA: Diagnosis not present

## 2022-06-27 DIAGNOSIS — Z9221 Personal history of antineoplastic chemotherapy: Secondary | ICD-10-CM | POA: Diagnosis not present

## 2022-06-27 DIAGNOSIS — F419 Anxiety disorder, unspecified: Secondary | ICD-10-CM | POA: Diagnosis present

## 2022-06-27 DIAGNOSIS — K9189 Other postprocedural complications and disorders of digestive system: Secondary | ICD-10-CM | POA: Diagnosis present

## 2022-06-27 DIAGNOSIS — I454 Nonspecific intraventricular block: Secondary | ICD-10-CM | POA: Diagnosis not present

## 2022-06-27 DIAGNOSIS — Z9889 Other specified postprocedural states: Secondary | ICD-10-CM | POA: Diagnosis present

## 2022-06-27 DIAGNOSIS — Z800 Family history of malignant neoplasm of digestive organs: Secondary | ICD-10-CM

## 2022-06-27 DIAGNOSIS — C155 Malignant neoplasm of lower third of esophagus: Secondary | ICD-10-CM | POA: Diagnosis present

## 2022-06-27 DIAGNOSIS — Z79899 Other long term (current) drug therapy: Secondary | ICD-10-CM

## 2022-06-27 DIAGNOSIS — Z8051 Family history of malignant neoplasm of kidney: Secondary | ICD-10-CM

## 2022-06-27 DIAGNOSIS — R251 Tremor, unspecified: Secondary | ICD-10-CM | POA: Diagnosis not present

## 2022-06-27 DIAGNOSIS — Z888 Allergy status to other drugs, medicaments and biological substances status: Secondary | ICD-10-CM

## 2022-06-27 DIAGNOSIS — Z87442 Personal history of urinary calculi: Secondary | ICD-10-CM

## 2022-06-27 DIAGNOSIS — Z9049 Acquired absence of other specified parts of digestive tract: Principal | ICD-10-CM

## 2022-06-27 HISTORY — PX: ESOPHAGOGASTRODUODENOSCOPY: SHX5428

## 2022-06-27 HISTORY — PX: ESOPHAGEAL STENT PLACEMENT: SHX5540

## 2022-06-27 LAB — GLUCOSE, CAPILLARY
Glucose-Capillary: 94 mg/dL (ref 70–99)
Glucose-Capillary: 169 mg/dL — ABNORMAL HIGH (ref 70–99)

## 2022-06-27 LAB — APTT: aPTT: 29 seconds (ref 24–36)

## 2022-06-27 LAB — PROTIME-INR
INR: 1.1 (ref 0.8–1.2)
Prothrombin Time: 14.3 seconds (ref 11.4–15.2)

## 2022-06-27 SURGERY — EGD (ESOPHAGOGASTRODUODENOSCOPY)
Anesthesia: General

## 2022-06-27 MED ORDER — MIDAZOLAM HCL 2 MG/2ML IJ SOLN: INTRAMUSCULAR | Status: DC | PRN

## 2022-06-27 MED ORDER — SUGAMMADEX SODIUM 200 MG/2ML IV SOLN
INTRAVENOUS | Status: DC | PRN
  Administered 2022-06-27: 300 mg via INTRAVENOUS

## 2022-06-27 MED ORDER — DEXAMETHASONE SODIUM PHOSPHATE 10 MG/ML IJ SOLN
INTRAMUSCULAR | Status: DC | PRN
  Administered 2022-06-27: 10 mg via INTRAVENOUS

## 2022-06-27 MED ORDER — SODIUM CHLORIDE 0.9 % IR SOLN
30.0000 mL | Freq: Three times a day (TID) | Status: DC
  Administered 2022-06-27 – 2022-06-29 (×4): 30 mL

## 2022-06-27 MED ORDER — PROPOFOL 10 MG/ML IV BOLUS
INTRAVENOUS | Status: DC | PRN
  Administered 2022-06-27: 100 mg via INTRAVENOUS

## 2022-06-27 MED ORDER — NORMAL SALINE FLUSH 0.9 % IV SOLN: 30.0000 mL | Freq: Three times a day (TID) | INTRAVENOUS | Status: DC

## 2022-06-27 MED ORDER — FENTANYL CITRATE (PF) 250 MCG/5ML IJ SOLN
INTRAMUSCULAR | Status: DC | PRN
  Administered 2022-06-27: 100 ug via INTRAVENOUS

## 2022-06-27 MED ORDER — CLONIDINE HCL 0.1 MG/24HR TD PTWK: 0.1000 mg | MEDICATED_PATCH | TRANSDERMAL | Status: DC

## 2022-06-27 MED ORDER — FENTANYL CITRATE (PF) 250 MCG/5ML IJ SOLN
INTRAMUSCULAR | Status: AC
  Filled 2022-06-27: qty 5

## 2022-06-27 MED ORDER — PHENYLEPHRINE 80 MCG/ML (10ML) SYRINGE FOR IV PUSH (FOR BLOOD PRESSURE SUPPORT)
PREFILLED_SYRINGE | INTRAVENOUS | Status: DC | PRN
  Administered 2022-06-27: 160 ug via INTRAVENOUS

## 2022-06-27 MED ORDER — ONDANSETRON 4 MG PO TBDP: 8.0000 mg | ORAL_TABLET | Freq: Three times a day (TID) | ORAL | Status: DC | PRN

## 2022-06-27 MED ORDER — GUAIFENESIN 100 MG/5ML PO LIQD
15.0000 mL | ORAL | Status: DC | PRN
  Administered 2022-06-27: 15 mL
  Filled 2022-06-27: qty 20

## 2022-06-27 MED ORDER — ROCURONIUM BROMIDE 10 MG/ML (PF) SYRINGE
PREFILLED_SYRINGE | INTRAVENOUS | Status: DC | PRN
  Administered 2022-06-27: 40 mg via INTRAVENOUS

## 2022-06-27 MED ORDER — 0.9 % SODIUM CHLORIDE (POUR BTL) OPTIME
TOPICAL | Status: DC | PRN
  Administered 2022-06-27: 500 mL

## 2022-06-27 MED ORDER — MIDAZOLAM HCL 2 MG/2ML IJ SOLN
INTRAMUSCULAR | Status: DC | PRN
  Administered 2022-06-27: 2 mg via INTRAVENOUS

## 2022-06-27 MED ORDER — SUGAMMADEX SODIUM 200 MG/2ML IV SOLN: INTRAVENOUS | Status: DC | PRN

## 2022-06-27 MED ORDER — PROPOFOL 10 MG/ML IV BOLUS
INTRAVENOUS | Status: AC
  Filled 2022-06-27: qty 20

## 2022-06-27 MED ORDER — OSMOLITE 1.5 CAL PO LIQD
1710.0000 mL | ORAL | Status: DC
  Administered 2022-06-27 – 2022-06-29 (×3): 1710 mL

## 2022-06-27 MED ORDER — ZINC OXIDE 12.8 % EX OINT
TOPICAL_OINTMENT | Freq: Two times a day (BID) | CUTANEOUS | Status: DC
  Filled 2022-06-27: qty 56.7

## 2022-06-27 MED ORDER — LIDOCAINE 2% (20 MG/ML) 5 ML SYRINGE
INTRAMUSCULAR | Status: DC | PRN
  Administered 2022-06-27: 60 mg via INTRAVENOUS

## 2022-06-27 MED ORDER — HYDROCODONE-ACETAMINOPHEN 7.5-325 MG/15ML PO SOLN: 10.0000 mL | Freq: Four times a day (QID) | ORAL | Status: DC | PRN

## 2022-06-27 MED ORDER — ORAL CARE MOUTH RINSE: 15.0000 mL | OROMUCOSAL | Status: DC | PRN

## 2022-06-27 MED ORDER — ONDANSETRON HCL 4 MG/2ML IJ SOLN
INTRAMUSCULAR | Status: DC | PRN
  Administered 2022-06-27: 4 mg via INTRAVENOUS

## 2022-06-27 MED ORDER — LACTATED RINGERS IV SOLN: INTRAVENOUS | Status: DC

## 2022-06-27 MED ORDER — MIDAZOLAM HCL 2 MG/2ML IJ SOLN
INTRAMUSCULAR | Status: AC
  Filled 2022-06-27: qty 2

## 2022-06-27 MED ORDER — OLOPATADINE HCL 0.1 % OP SOLN
1.0000 [drp] | Freq: Two times a day (BID) | OPHTHALMIC | Status: DC
  Administered 2022-06-27 – 2022-06-29 (×4): 1 [drp] via OPHTHALMIC
  Filled 2022-06-27: qty 5

## 2022-06-27 MED ORDER — CHLORHEXIDINE GLUCONATE 0.12 % MT SOLN: 15.0000 mL | OROMUCOSAL | Status: AC

## 2022-06-27 MED ORDER — METOPROLOL TARTRATE 25 MG/10 ML ORAL SUSPENSION
12.5000 mg | Freq: Two times a day (BID) | ORAL | Status: DC
  Administered 2022-06-27 – 2022-06-29 (×4): 12.5 mg
  Filled 2022-06-27 (×5): qty 5

## 2022-06-27 MED ORDER — CHLORHEXIDINE GLUCONATE 0.12 % MT SOLN
OROMUCOSAL | Status: AC
  Administered 2022-06-27: 15 mL via OROMUCOSAL
  Filled 2022-06-27: qty 15

## 2022-06-27 SURGICAL SUPPLY — 30 items
BUTTON OLYMPUS DEFENDO 5 PIECE (MISCELLANEOUS) ×1 IMPLANT
CANISTER SUCT 3000ML PPV (MISCELLANEOUS) ×1 IMPLANT
CNTNR URN SCR LID CUP LEK RST (MISCELLANEOUS) ×1 IMPLANT
CONT SPEC 4OZ STRL OR WHT (MISCELLANEOUS) ×1
COVER BACK TABLE 60X90IN (DRAPES) ×1 IMPLANT
FORCEPS GRASP COMBO 8X230 (FORCEP) IMPLANT
GAUZE 4X4 16PLY ~~LOC~~+RFID DBL (SPONGE) ×1 IMPLANT
GAUZE SPONGE 4X4 12PLY STRL (GAUZE/BANDAGES/DRESSINGS) ×1 IMPLANT
GLOVE BIO SURGEON STRL SZ7 (GLOVE) ×1 IMPLANT
GLOVE BIO SURGEON STRL SZ7.5 (GLOVE) ×1 IMPLANT
GOWN STRL REUS W/ TWL XL LVL3 (GOWN DISPOSABLE) ×1 IMPLANT
GOWN STRL REUS W/TWL XL LVL3 (GOWN DISPOSABLE) ×1
KIT TURNOVER KIT B (KITS) ×1 IMPLANT
MARKER SKIN DUAL TIP RULER LAB (MISCELLANEOUS) ×1 IMPLANT
NS IRRIG 1000ML POUR BTL (IV SOLUTION) ×1 IMPLANT
OIL SILICONE PENTAX (PARTS (SERVICE/REPAIRS)) IMPLANT
PAD ARMBOARD 7.5X6 YLW CONV (MISCELLANEOUS) ×2 IMPLANT
SPONGE T-LAP 18X18 ~~LOC~~+RFID (SPONGE) ×4 IMPLANT
STENT WALLFLEX 23X15.5 (STENTS) IMPLANT
SYR 20ML ECCENTRIC (SYRINGE) ×1 IMPLANT
SYR 30ML SLIP (SYRINGE) ×1 IMPLANT
TOWEL GREEN STERILE (TOWEL DISPOSABLE) ×1 IMPLANT
TOWEL GREEN STERILE FF (TOWEL DISPOSABLE) ×1 IMPLANT
TRAY FOL W/BAG SLVR 16FR STRL (SET/KITS/TRAYS/PACK) ×1 IMPLANT
TRAY FOLEY W/BAG SLVR 16FR LF (SET/KITS/TRAYS/PACK)
TUBE CONNECTING 20X1/4 (TUBING) ×1 IMPLANT
TUBING ENDO SMARTCAP (MISCELLANEOUS) ×1 IMPLANT
UNDERPAD 30X36 HEAVY ABSORB (UNDERPADS AND DIAPERS) ×1 IMPLANT
WATER STERILE IRR 1000ML POUR (IV SOLUTION) ×1 IMPLANT
WIRE HYDRA 450CM (MISCELLANEOUS) IMPLANT

## 2022-06-27 NOTE — Anesthesia Postprocedure Evaluation (Signed)
Anesthesia Post Note  Patient: Keith Moreno  Procedure(s) Performed: ESOPHAGOGASTRODUODENOSCOPY (EGD) ESOPHAGEAL STENT REMOVAL     Patient location during evaluation: PACU Anesthesia Type: General Level of consciousness: awake and alert Pain management: pain level controlled Vital Signs Assessment: post-procedure vital signs reviewed and stable Respiratory status: spontaneous breathing, nonlabored ventilation, respiratory function stable and patient connected to nasal cannula oxygen Cardiovascular status: blood pressure returned to baseline and stable Postop Assessment: no apparent nausea or vomiting Anesthetic complications: no  No notable events documented.  Last Vitals:  Vitals:   06/27/22 1615 06/27/22 1630  BP: (!) 142/88 (!) 152/88  Pulse: 77 80  Resp: 15 14  Temp:  37 C  SpO2: 94% 93%    Last Pain:  Vitals:   06/27/22 1630  TempSrc:   PainSc: 0-No pain                 Jermey Closs,W. EDMOND

## 2022-06-27 NOTE — H&P (Signed)
Keith Moreno is an 61 y.o. male.   Chief Complaint: esophageal stent HPI: 61 yo male w/ hx of esophagectomy complicated by anastomotic leak and subsequently treated with an esophageal stent 7 weeks ago presents for stent removal and re-evaluation of leak.  He has been tolerating a liquid diet without any significant change in his chest tube output.  Cross section imaging also show no pleural effusion.  Past Medical History:  Diagnosis Date   Anxiety    Cancer ([place])    Esophageal Cancer   COVID 2022   mild case   Dyspnea    r/t chemo and radiation   Headache    History of kidney stones    Hypertension    Kidney infection    at age 15   Pneumonia    61 years old   Pre-diabetes    PTSD (post-traumatic stress disorder)    per pt, if woken up suddenly he "cocks back arm" as if to punch but usually wakes up enough to come to before he hits anyone    Past Surgical History:  Procedure Laterality Date   BIOPSY  11/13/2021   Procedure: BIOPSY;  Surgeon: Jerene Bears, MD;  Location: [facility];  Service: Gastroenterology;;   BIOPSY  12/14/2021   Procedure: BIOPSY;  Surgeon: Irving Copas., MD;  Location: [facility];  Service: Gastroenterology;;   BRONCHIAL BRUSHINGS  12/14/2021   Procedure: BRONCHIAL BRUSHINGS;  Surgeon: Collene Gobble, MD;  Location: [facility];  Service: Cardiopulmonary;;   BRONCHIAL NEEDLE ASPIRATION BIOPSY  12/14/2021   Procedure: BRONCHIAL NEEDLE ASPIRATION BIOPSIES;  Surgeon: Collene Gobble, MD;  Location: [facility];  Service: Cardiopulmonary;;   COLONOSCOPY  2018   CYST EXCISION  2022   left side of head   ESOPHAGECTOMY  04/05/2022   ESOPHAGOGASTRODUODENOSCOPY N/A 11/13/2021   Procedure: ESOPHAGOGASTRODUODENOSCOPY (EGD);  Surgeon: Jerene Bears, MD;  Location: [facility] ENDOSCOPY;  Service: Gastroenterology;  Laterality: N/A;   ESOPHAGOGASTRODUODENOSCOPY N/A 04/05/2022   Procedure: ESOPHAGOGASTRODUODENOSCOPY (EGD);  Surgeon: Lajuana Matte,  MD;  Location: [facility] OR;  Service: Thoracic;  Laterality: N/A;   ESOPHAGOGASTRODUODENOSCOPY N/A 05/09/2022   Procedure: ESOPHAGOGASTRODUODENOSCOPY (EGD);  Surgeon: Lajuana Matte, MD;  Location: [facility] OR;  Service: Thoracic;  Laterality: N/A;  esophageal stent placement   ESOPHAGOGASTRODUODENOSCOPY (EGD) WITH PROPOFOL N/A 12/14/2021   Procedure: ESOPHAGOGASTRODUODENOSCOPY (EGD) WITH PROPOFOL;  Surgeon: Irving Copas., MD;  Location: [facility];  Service: Gastroenterology;  Laterality: N/A;   EUS N/A 12/14/2021   Procedure: UPPER ENDOSCOPIC ULTRASOUND (EUS) LINEAR;  Surgeon: Irving Copas., MD;  Location: [facility];  Service: Gastroenterology;  Laterality: N/A;   FINE NEEDLE ASPIRATION  12/14/2021   Procedure: FINE NEEDLE ASPIRATION (FNA) LINEAR;  Surgeon: Irving Copas., MD;  Location: [facility];  Service: Gastroenterology;;   FOREIGN BODY REMOVAL  11/13/2021   Procedure: FOREIGN BODY REMOVAL;  Surgeon: Jerene Bears, MD;  Location: [facility] ENDOSCOPY;  Service: Gastroenterology;;   HUMERUS FRACTURE SURGERY Right    INTERCOSTAL NERVE BLOCK  04/05/2022   Procedure: INTERCOSTAL NERVE BLOCK;  Surgeon: Lajuana Matte, MD;  Location: [facility];  Service: Thoracic;;   IR IMAGING GUIDED PORT INSERTION  12/23/2021   IR THORACENTESIS ASP PLEURAL SPACE W/IMG GUIDE  04/19/2022   LAPAROSCOPIC JEJUNOSTOMY N/A 05/09/2022   Procedure: LAPAROSCOPIC JEJUNOSTOMY;  Surgeon: Lajuana Matte, MD;  Location: [facility];  Service: Thoracic;  Laterality: N/A;   MULTIPLE TOOTH EXTRACTIONS  2015   NODE DISSECTION  04/05/2022  Procedure: NODE DISSECTION;  Surgeon: Lajuana Matte, MD;  Location: [facility];  Service: Thoracic;;   TONSILLECTOMY AND ADENOIDECTOMY     as a child   VIDEO ASSISTED THORACOSCOPY Right 05/09/2022   Procedure: VIDEO ASSISTED THORACOSCOPY;  Surgeon: Lajuana Matte, MD;  Location: [facility];  Service: Thoracic;  Laterality: Right;   VIDEO BRONCHOSCOPY WITH  ENDOBRONCHIAL ULTRASOUND Bilateral 12/14/2021   Procedure: VIDEO BRONCHOSCOPY WITH ENDOBRONCHIAL ULTRASOUND;  Surgeon: Collene Gobble, MD;  Location: [facility];  Service: Cardiopulmonary;  Laterality: Bilateral;    Family History  Problem Relation Age of Onset   Hypertension Mother    Diabetes Mother    Bipolar disorder Mother    Hypertension Father    Kidney cancer Sister    Colon cancer Paternal Uncle    Cancer Cousin    Bladder Cancer Cousin    Cancer Cousin    Social History:  reports that he quit smoking about 40 years ago. His smoking use included cigars, cigarettes, and pipe. He quit smokeless tobacco use about 40 years ago.  His smokeless tobacco use included chew. He reports current alcohol use. He reports that he does not use drugs.  Allergies:  Allergies  Allergen Reactions   Procaine     Other reaction(s): Other, Respiratory Distress   Chocolate     Hyperactivity, respiratory distress   Other     Novocaine- respiratory distress    Medications Prior to Admission  Medication Sig Dispense Refill   amoxicillin-clavulanate (AUGMENTIN) 400-57 MG/5ML suspension Take 10 mLs (800 mg total) by mouth 2 (two) times daily for 28 days. 560 mL 0   cloNIDine (CATAPRES - DOSED IN MG/24 HR) 0.1 mg/24hr patch Place 1 patch (0.1 mg total) onto the skin once a week. 4 patch 12   guaiFENesin (ROBITUSSIN) 100 MG/5ML liquid Place 15 mLs into feeding tube every 4 (four) hours as needed for cough or to loosen phlegm. 120 mL 0   HYDROcodone-acetaminophen (HYCET) 7.5-325 mg/15 ml solution Place 10 mLs into feeding tube every 6 (six) hours as needed for moderate pain. 280 mL 0   lansoprazole (PREVACID) 30 MG capsule Take 1 capsule (30 mg total) by mouth 2 (two) times daily before a meal. Sprinkle capsule into water and place in JP tube, then flush with more water 30 capsule 1   Metformin HCl 500 MG/5ML SOLN Place 5 mLs (500 mg total) into feeding tube 2 (two) times daily. 300 mL 3    Nutritional Supplements (FEEDING SUPPLEMENT, OSMOLITE 1.5 CAL,) LIQD Place 1,710 mLs into feeding tube daily. 60000 mL 3   olopatadine (PATANOL) 0.1 % ophthalmic solution Place 1 drop into both eyes 2 (two) times daily.     ondansetron (ZOFRAN-ODT) 8 MG disintegrating tablet Take 1 tablet (8 mg total) by mouth every 8 (eight) hours as needed for nausea or vomiting. 90 tablet 3   Sodium Chloride Flush (NORMAL SALINE FLUSH) 0.9 % SOLN 30 mLs by Per J Tube route every 8 (eight) hours. 2700 mL 3   Zinc Oxide (TRIPLE PASTE) 12.8 % ointment Apply topically 2 (two) times daily. To J-Tube site 56.7 g 0   metoprolol tartrate (LOPRESSOR) 25 mg/10 mL SUSP Place 5 mLs (12.5 mg total) into feeding tube 2 (two) times daily. (Patient not taking: Reported on 06/22/2022) 300 mL 3    Results for orders placed or performed during the hospital encounter of 06/27/22 (from the past 48 hour(s))  Protime-INR     Status: None   Collection  Time: 06/27/22  1:45 PM  Result Value Ref Range   Prothrombin Time 14.3 11.4 - 15.2 seconds   INR 1.1 0.8 - 1.2    Comment: (NOTE) INR goal varies based on device and disease states. Performed at [facility], [place] [address], [place], [place] [address]   APTT     Status: None   Collection Time: 06/27/22  1:45 PM  Result Value Ref Range   aPTT 29 24 - 36 seconds    Comment: Performed at [facility] [address] [place], [place] [address]   No results found.  Review of Systems  Constitutional:  Negative for chills and diaphoresis.  Cardiovascular:  Negative for chest pain.    Blood pressure (!) 154/99, pulse 90, temperature 98.8 F (37.1 C), temperature source Oral, resp. rate 20, height 6' (1.829 m), weight (!) 143.3 kg, SpO2 96 %. Physical Exam Constitutional:      General: He is not in acute distress.    Appearance: Normal appearance. He is not ill-appearing.  Cardiovascular:     Rate and Rhythm: Normal rate and regular rhythm.  Pulmonary:      Effort: Pulmonary effort is normal. No respiratory distress.  Abdominal:     General: Abdomen is flat. There is no distension.  Musculoskeletal:        General: Normal range of motion.     Cervical back: Normal range of motion.  Skin:    General: Skin is warm and dry.  Neurological:     General: No focal deficit present.     Mental Status: He is alert and oriented to person, place, and time.      Assessment/Plan OR today for EGD and esophageal stent removal  Lajuana Matte, MD 06/27/2022, 2:34 PM

## 2022-06-27 NOTE — Anesthesia Preprocedure Evaluation (Addendum)
Anesthesia Evaluation  Patient identified by MRN, date of birth, ID band Patient awake    Reviewed: Allergy & Precautions, H&P , NPO status , Patient's Chart, lab work & pertinent test results, reviewed documented beta blocker date and time   Airway Mallampati: II  TM Distance: >3 FB Neck ROM: Full    Dental no notable dental hx. (+) Edentulous Upper, Partial Lower, Dental Advisory Given   Pulmonary former smoker   Pulmonary exam normal breath sounds clear to auscultation       Cardiovascular hypertension, Pt. on medications and Pt. on home beta blockers  Rhythm:Regular Rate:Normal     Neuro/Psych  Headaches  Anxiety        GI/Hepatic negative GI ROS, Neg liver ROS,,,  Endo/Other    Morbid obesity  Renal/GU negative Renal ROS  negative genitourinary   Musculoskeletal   Abdominal   Peds  Hematology negative hematology ROS (+)   Anesthesia Other Findings   Reproductive/Obstetrics negative OB ROS                             Anesthesia Physical Anesthesia Plan  ASA: 3  Anesthesia Plan: General   Post-op Pain Management: Ofirmev IV (intra-op)*   Induction: Intravenous  PONV Risk Score and Plan: 3 and Ondansetron, Dexamethasone and Midazolam  Airway Management Planned: Oral ETT  Additional Equipment:   Intra-op Plan:   Post-operative Plan: Extubation in OR  Informed Consent: I have reviewed the patients History and Physical, chart, labs and discussed the procedure including the risks, benefits and alternatives for the proposed anesthesia with the patient or authorized representative who has indicated his/her understanding and acceptance.     Dental advisory given  Plan Discussed with: CRNA and Surgeon  Anesthesia Plan Comments:        Anesthesia Quick Evaluation

## 2022-06-27 NOTE — Transfer of Care (Signed)
Immediate Anesthesia Transfer of Care Note  Patient: Keith Moreno  Procedure(s) Performed: ESOPHAGOGASTRODUODENOSCOPY (EGD) ESOPHAGEAL STENT REMOVAL  Patient Location: PACU  Anesthesia Type:General  Level of Consciousness: awake, alert , and oriented  Airway & Oxygen Therapy: Patient Spontanous Breathing and Patient connected to face mask oxygen  Post-op Assessment: Report given to RN, Post -op Vital signs reviewed and stable, and Patient moving all extremities X 4  Post vital signs: Reviewed and stable  Last Vitals:  Vitals Value Taken Time  BP 133/74 06/27/22 1536  Temp 36.6 C 06/27/22 1536  Pulse 81 06/27/22 1544  Resp 12 06/27/22 1544  SpO2 93 % 06/27/22 1544  Vitals shown include unvalidated device data.  Last Pain:  Vitals:   06/27/22 1323  TempSrc:   PainSc: 0-No pain      Patients Stated Pain Goal: 0 (36/64/40 3474)  Complications: No notable events documented.

## 2022-06-27 NOTE — Op Note (Signed)
      [address]       ,[place] [address]             [phone]        06/27/2022  Patient:  Keith Moreno Pre-Op Dx: Esophageal leak s/p esophageal stent placement     Hx of Ivor Lewis esophagectomy Post-op Dx:  same Procedure: - Esophagogastroscopy - removal of esophageal stent - on table esophagram with Omnipaque   Surgeon and Role:      * Eathel Pajak, Lucile Crater, MD - Primary Anesthesia  general EBL:  41m Blood Administration: none Specimen:  none   Counts: correct   Indications: 89[place] presents for esophageal stent removal and assessment of previous anastomotic leak. Findings: Fibrinous plaque at the region of previous anastomotic leak.  Mucosa appeared pink and healthy.  No obvious leak in supine position.  Operative Technique: After the risks, benefits and alternatives were thoroughly discussed, the patient was brought to the operative theatre.  Anesthesia was induced. The patient was prepped and draped in normal sterile fashion.  An appropriate surgical pause was performed, and pre-operative antibiotics were dosed accordingly.  The gastroscope was advanced through the oropharynx into the cervical esophagus under direct visualization.  The scope was passed into the stomach.  The previous stent was grast and removed without difficulty.  The scope was then placed back, and the esophageal mucosa was visualized.  The previous site of the anastomotic leak was covered with a fibrinous plaque.  There was no obvious hold at this site.  Omnipaque was injected through the gastroscope.  There was no obvious extravasation on fluoroscopy.  The esophagus was distended at the level of the anastomosis with any clear leak.    The patient tolerated the procedure without any immediate complications, and was transferred to the PACU in stable condition.  Angelli Baruch OBary Leriche

## 2022-06-27 NOTE — Anesthesia Procedure Notes (Signed)
Procedure Name: Intubation Date/Time: 06/27/2022 2:53 PM  Performed by: Rande Brunt, CRNAPre-anesthesia Checklist: Patient identified, Emergency Drugs available, Suction available and Patient being monitored Patient Re-evaluated:Patient Re-evaluated prior to induction Oxygen Delivery Method: Circle System Utilized Preoxygenation: Pre-oxygenation with 100% oxygen Induction Type: IV induction Ventilation: Mask ventilation without difficulty Laryngoscope Size: Mac and 4 Grade View: Grade I Tube type: Oral Tube size: 7.5 mm Number of attempts: 1 Airway Equipment and Method: Stylet Placement Confirmation: ETT inserted through vocal cords under direct vision, positive ETCO2 and breath sounds checked- equal and bilateral Secured at: 22 cm Tube secured with: Tape Dental Injury: Teeth and Oropharynx as per pre-operative assessment

## 2022-06-28 ENCOUNTER — Inpatient Hospital Stay (HOSPITAL_COMMUNITY): Payer: No Typology Code available for payment source

## 2022-06-28 ENCOUNTER — Inpatient Hospital Stay (HOSPITAL_COMMUNITY): Payer: No Typology Code available for payment source | Admitting: Anesthesiology

## 2022-06-28 ENCOUNTER — Encounter (HOSPITAL_COMMUNITY): Payer: Self-pay | Admitting: Thoracic Surgery (Cardiothoracic Vascular Surgery)

## 2022-06-28 ENCOUNTER — Other Ambulatory Visit: Payer: Self-pay

## 2022-06-28 ENCOUNTER — Encounter (HOSPITAL_COMMUNITY)
Admission: AD | Disposition: A | Discharge status: Home / Self Care | Payer: Self-pay | Source: Ambulatory Visit | Attending: Thoracic Surgery (Cardiothoracic Vascular Surgery)

## 2022-06-28 DIAGNOSIS — K2289 Other specified disease of esophagus: Secondary | ICD-10-CM

## 2022-06-28 DIAGNOSIS — Z6841 Body Mass Index (BMI) 40.0 and over, adult: Secondary | ICD-10-CM

## 2022-06-28 DIAGNOSIS — I10 Essential (primary) hypertension: Secondary | ICD-10-CM | POA: Diagnosis not present

## 2022-06-28 DIAGNOSIS — Z87891 Personal history of nicotine dependence: Secondary | ICD-10-CM

## 2022-06-28 DIAGNOSIS — Z4659 Encounter for fitting and adjustment of other gastrointestinal appliance and device: Secondary | ICD-10-CM

## 2022-06-28 HISTORY — PX: ESOPHAGOGASTRODUODENOSCOPY: SHX5428

## 2022-06-28 HISTORY — PX: ESOPHAGEAL STENT PLACEMENT: SHX5540

## 2022-06-28 LAB — GLUCOSE, CAPILLARY
Glucose-Capillary: 248 mg/dL — ABNORMAL HIGH (ref 70–99)
Glucose-Capillary: 208 mg/dL — ABNORMAL HIGH (ref 70–99)
Glucose-Capillary: 123 mg/dL — ABNORMAL HIGH (ref 70–99)
Glucose-Capillary: 219 mg/dL — ABNORMAL HIGH (ref 70–99)
Glucose-Capillary: 261 mg/dL — ABNORMAL HIGH (ref 70–99)

## 2022-06-28 LAB — MRSA NEXT GEN BY PCR, NASAL: MRSA by PCR Next Gen: NOT DETECTED

## 2022-06-28 SURGERY — INSERTION, STENT, ESOPHAGUS
Anesthesia: General

## 2022-06-28 MED ORDER — CEFAZOLIN SODIUM-DEXTROSE 1-4 GM/50ML-% IV SOLN
1.0000 g | INTRAVENOUS | Status: AC
  Administered 2022-06-28: 1 g via INTRAVENOUS
  Filled 2022-06-28 (×2): qty 50

## 2022-06-28 MED ORDER — PROPOFOL 10 MG/ML IV BOLUS
INTRAVENOUS | Status: DC | PRN
  Administered 2022-06-28: 100 mg via INTRAVENOUS

## 2022-06-28 MED ORDER — FENTANYL CITRATE (PF) 250 MCG/5ML IJ SOLN
INTRAMUSCULAR | Status: DC | PRN
  Administered 2022-06-28: 100 ug via INTRAVENOUS

## 2022-06-28 MED ORDER — CHLORHEXIDINE GLUCONATE 0.12 % MT SOLN
OROMUCOSAL | Status: AC
  Administered 2022-06-28: 15 mL via OROMUCOSAL
  Filled 2022-06-28: qty 15

## 2022-06-28 MED ORDER — PROPOFOL 10 MG/ML IV BOLUS
INTRAVENOUS | Status: AC
  Filled 2022-06-28: qty 20

## 2022-06-28 MED ORDER — FENTANYL CITRATE (PF) 250 MCG/5ML IJ SOLN
INTRAMUSCULAR | Status: AC
  Filled 2022-06-28: qty 5

## 2022-06-28 MED ORDER — CHLORHEXIDINE GLUCONATE 0.12 % MT SOLN: 15.0000 mL | Freq: Once | OROMUCOSAL | Status: AC

## 2022-06-28 MED ORDER — 0.9 % SODIUM CHLORIDE (POUR BTL) OPTIME
TOPICAL | Status: DC | PRN
  Administered 2022-06-28: 1000 mL

## 2022-06-28 MED ORDER — LACTATED RINGERS IV SOLN: INTRAVENOUS | Status: DC

## 2022-06-28 MED ORDER — MIDAZOLAM HCL 2 MG/2ML IJ SOLN
INTRAMUSCULAR | Status: AC
  Filled 2022-06-28: qty 2

## 2022-06-28 MED ORDER — LIDOCAINE 2% (20 MG/ML) 5 ML SYRINGE
INTRAMUSCULAR | Status: DC | PRN
  Administered 2022-06-28: 40 mg via INTRAVENOUS

## 2022-06-28 MED ORDER — ROCURONIUM BROMIDE 10 MG/ML (PF) SYRINGE
PREFILLED_SYRINGE | INTRAVENOUS | Status: DC | PRN
  Administered 2022-06-28: 50 mg via INTRAVENOUS

## 2022-06-28 MED ORDER — DEXAMETHASONE SODIUM PHOSPHATE 10 MG/ML IJ SOLN
INTRAMUSCULAR | Status: DC | PRN
  Administered 2022-06-28: 10 mg via INTRAVENOUS

## 2022-06-28 MED ORDER — IOHEXOL 300 MG/ML  SOLN
100.0000 mL | Freq: Once | INTRAMUSCULAR | Status: AC | PRN
  Administered 2022-06-28: 80 mL via ORAL

## 2022-06-28 MED ORDER — ORAL CARE MOUTH RINSE: 15.0000 mL | Freq: Once | OROMUCOSAL | Status: AC

## 2022-06-28 MED ORDER — PHENYLEPHRINE 80 MCG/ML (10ML) SYRINGE FOR IV PUSH (FOR BLOOD PRESSURE SUPPORT)
PREFILLED_SYRINGE | INTRAVENOUS | Status: DC | PRN
  Administered 2022-06-28 (×3): 160 ug via INTRAVENOUS

## 2022-06-28 MED ORDER — SUGAMMADEX SODIUM 200 MG/2ML IV SOLN
INTRAVENOUS | Status: DC | PRN
  Administered 2022-06-28: 42690 mg via INTRAVENOUS

## 2022-06-28 MED ORDER — MIDAZOLAM HCL 2 MG/2ML IJ SOLN
INTRAMUSCULAR | Status: DC | PRN
  Administered 2022-06-28: 2 mg via INTRAVENOUS

## 2022-06-28 MED ORDER — ONDANSETRON HCL 4 MG/2ML IJ SOLN
INTRAMUSCULAR | Status: DC | PRN
  Administered 2022-06-28: 4 mg via INTRAVENOUS

## 2022-06-28 SURGICAL SUPPLY — 31 items
BUTTON OLYMPUS DEFENDO 5 PIECE (MISCELLANEOUS) ×1 IMPLANT
CANISTER SUCT 3000ML PPV (MISCELLANEOUS) ×1 IMPLANT
CNTNR URN SCR LID CUP LEK RST (MISCELLANEOUS) ×1 IMPLANT
CONT SPEC 4OZ STRL OR WHT (MISCELLANEOUS)
COVER BACK TABLE 60X90IN (DRAPES) ×1 IMPLANT
GAUZE 4X4 16PLY ~~LOC~~+RFID DBL (SPONGE) ×1 IMPLANT
GAUZE SPONGE 4X4 12PLY STRL (GAUZE/BANDAGES/DRESSINGS) ×1 IMPLANT
GLOVE BIO SURGEON STRL SZ7 (GLOVE) ×1 IMPLANT
GLOVE BIO SURGEON STRL SZ7.5 (GLOVE) ×1 IMPLANT
GOWN STRL REUS W/ TWL XL LVL3 (GOWN DISPOSABLE) ×1 IMPLANT
GOWN STRL REUS W/TWL XL LVL3 (GOWN DISPOSABLE) ×1
GUIDEWIRE JAGWIRE PULMNRY .035 (MISCELLANEOUS) IMPLANT
JAGWIRE PULMONARY .035 (MISCELLANEOUS) ×1
KIT TURNOVER KIT B (KITS) ×1 IMPLANT
MARKER SKIN DUAL TIP RULER LAB (MISCELLANEOUS) ×1 IMPLANT
NS IRRIG 1000ML POUR BTL (IV SOLUTION) ×1 IMPLANT
OIL SILICONE PENTAX (PARTS (SERVICE/REPAIRS)) IMPLANT
PAD ARMBOARD 7.5X6 YLW CONV (MISCELLANEOUS) ×2 IMPLANT
SPONGE T-LAP 18X18 ~~LOC~~+RFID (SPONGE) ×4 IMPLANT
STENT WALLFLEX 23X15.5 (STENTS) IMPLANT
SYR 20ML ECCENTRIC (SYRINGE) ×1 IMPLANT
SYR 30ML SLIP (SYRINGE) ×1 IMPLANT
TOWEL GREEN STERILE (TOWEL DISPOSABLE) ×1 IMPLANT
TOWEL GREEN STERILE FF (TOWEL DISPOSABLE) ×1 IMPLANT
TRAY FOL W/BAG SLVR 16FR STRL (SET/KITS/TRAYS/PACK) ×1 IMPLANT
TRAY FOLEY W/BAG SLVR 16FR LF (SET/KITS/TRAYS/PACK) ×1
TUBE CONNECTING 20X1/4 (TUBING) ×1 IMPLANT
TUBING ENDO SMARTCAP (MISCELLANEOUS) ×1 IMPLANT
UNDERPAD 30X36 HEAVY ABSORB (UNDERPADS AND DIAPERS) ×1 IMPLANT
WATER STERILE IRR 1000ML POUR (IV SOLUTION) ×1 IMPLANT
WIRE HYDRA 450CM (MISCELLANEOUS) IMPLANT

## 2022-06-28 NOTE — Discharge Instructions (Signed)
Resume current home care, tube feedings and medications

## 2022-06-28 NOTE — Op Note (Signed)
      [address]       [place], [address]             [phone]        06/28/2022  Patient:  Diamantina Monks Pre-Op Dx: Esophagogastric anastomotic leak Hx of Ivor Lewis esophagectomy   Post-op Dx:  same Procedure: - Esophagogastroscopy - 155 mm covered esophageal stent placement   Surgeon and Role:      * Felica Chargois, Lucile Crater, MD - Primary  Anesthesia  general EBL:  51m Blood Administration: none Specimen:  none   Counts: correct   Indications: 89y[place][age] with hx of anastomotic leak following Ivor Lewis esophagectomy.  The previous stent was removed, and a formal esophagram showed a small leak that appeared contained.  I elected to replace the stent for further healing Findings: Stent placed without incident  Operative Technique: After the risks, benefits and alternatives were thoroughly discussed, the patient was brought to the operative theatre.  Anesthesia was induced. The patient was prepped and draped in normal sterile fashion.  An appropriate surgical pause was performed, and pre-operative antibiotics were dosed accordingly.  The gastroscope was advanced through the oropharynx into the cervical esophagus under direct visualization.  The scope was passed into the stomach.  The scope was then pulled back, and the esophageal mucosa was visualized.  The anastomosis was identified.   Next a Jag wire was passed through the gastroscope into the stomach with fluoroscopic guidance.  The esophageal stent was then placed over the wire, and positioned under fluoroscopy to cover the mucosal defect.    The patient tolerated the procedure without any immediate complications, and was transferred to the PACU in stable condition.  Balen Woolum OBary Leriche

## 2022-06-28 NOTE — Discharge Summary (Incomplete)
[address]       [place] [address]             [phone]    Physician Discharge Summary  Patient ID: Keith Moreno MRN: [id number] DOB/AGE: [birth year] 61 y.o.  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[place])    Low back pain 11/01/2021     Discharge Diagnoses:  Patient Active Problem List   Diagnosis Date Noted   Mediastinitis 05/30/2022   Pressure injury of skin 05/09/2022   Esophageal anastomotic leak 05/09/2022   Hypercalcemia 05/05/2022   History of esophagectomy 05/05/2022   Dysphagia 05/05/2022   Hypertension    Nocturnal hypoxia    Malnutrition of moderate degree 04/06/2022   Esophageal cancer ([place]) 04/05/2022   Port-A-Cath in place 12/26/2021   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 male.   Chief Complaint: esophageal stent HPI: 61 yo male w/ hx of esophagectomy complicated by anastomotic leak and subsequently treated with an esophageal stent 7 weeks ago presents for stent removal and re-evaluation of leak.  He has been tolerating a liquid diet without any significant change in his chest tube output.  Cross section imaging also show no pleural effusion.    Hospital course: He was scheduled for and underwent removal of the esophageal stent on 06/28/2022 by [name].  He tolerated procedure well and was taken to the postanesthesia care unit in stable condition.  He is to undergo a swallowing study on postop day #1 for reevaluation.  Swallow study results showed a persistent leak at the level of the mid thorax. Patient then underwent an EGD, esophageal stent placement on 12/20.    Consults: None  Significant Diagnostic Studies:   Study Result  Narrative & Impression  CLINICAL DATA:  Provided history: History of esophagectomy. Additional history obtained from [facility] of robotic assisted Ivor Lewis esophagectomy complicated by anastomotic leak, subsequently treated with esophageal stent. Recent stent removal.   EXAM: ESOPHOGRAM/BARIUM SWALLOW   TECHNIQUE: A single contrast examination was performed using 80 mL Omnipaque 300 water-soluble contrast.   FLUOROSCOPY: Fluoroscopy time: 1 minute, 24 seconds (136.30 mGy).   COMPARISON:  Chest CT 06/26/2022. Esophagram 05/29/2022.   FINDINGS: A problem-oriented water-soluble esophagram was performed to assess for persistent anastomotic leak.   Previously demonstrated esophageal stent no longer present.   Progressively increasing extraluminal contrast noted at the level of the mid thorax (to the right), compatible with persistent contrast leak (for instance as seen on series 11, image 8).   The examination was performed by Tsosie Billing, PA-C, and was supervised and interpreted by Dr. Kellie Simmering.   IMPRESSION: Patient with history of robotic assisted Ivor Lewis esophagectomy complicated by anastomotic leak.  A Problem-oriented water soluble contrast esophagram was performed to assess for persistent leak. A persistent leak was noted at the level of the mid thorax (with extraluminal contrast tracking to the right).     Electronically Signed    By: Kellie Simmering D.O.   On: 06/28/2022 10:48       Treatments: {Tx:18249}   Discharge Exam: Blood pressure (!) 142/91, pulse 71, temperature 98 F (36.7 C), temperature source Oral, resp. rate 18, height 6' (1.829 m), weight (!) 142.3 kg, SpO2 94 %. {physical [id number]   Discharge Medications:      Allergies as of 06/28/2022       Reactions   Procaine    Other reaction(s): Other, Respiratory Distress   Chocolate    Hyperactivity, respiratory distress   Other    Novocaine- respiratory distress     Med Rec must be completed prior to using this [facility]***       Follow-up Information     Lightfoot, Lucile Crater, MD Follow up.   Specialty: Cardiothoracic Surgery Why: Office will contact you with appointment date and time Contact information: [place] [id number] [phone]                 Signed:  Arnoldo Lenis 06/28/2022, 1:27 PM

## 2022-06-28 NOTE — Transfer of Care (Signed)
Immediate Anesthesia Transfer of Care Note  Patient: Keith Moreno  Procedure(s) Performed: ESOPHAGEAL STENT PLACEMENT ESOPHAGOGASTRODUODENOSCOPY (EGD)  Patient Location: PACU  Anesthesia Type:General  Level of Consciousness: awake, alert , and oriented  Airway & Oxygen Therapy: Patient Spontanous Breathing  Post-op Assessment: Report given to RN, Post -op Vital signs reviewed and stable, and Patient moving all extremities X 4  Post vital signs: Reviewed and stable  Last Vitals:  Vitals Value Taken Time  BP    Temp    Pulse 73 06/28/22 1540  Resp 13 06/28/22 1540  SpO2 96 % 06/28/22 1540  Vitals shown include unvalidated device data.  Last Pain:  Vitals:   06/28/22 1251  TempSrc: Oral  PainSc: 0-No pain      Patients Stated Pain Goal: 0 (74/82/70 7867)  Complications: No notable events documented.

## 2022-06-28 NOTE — Progress Notes (Signed)
      [address]       [place] [address]             [phone]       1 Day Post-Op Procedure(s) (LRB): ESOPHAGOGASTRODUODENOSCOPY (EGD) (N/A) ESOPHAGEAL STENT REMOVAL (N/A)  Subjective: Patient awake this am. He is burping a lot and waiting for swallow study  Objective: Vital signs in last 24 hours: Temp:  [97.8 F (36.6 C)-98.8 F (37.1 C)] 97.8 F (36.6 C) (12/20 0345) Pulse Rate:  [75-90] 79 (12/20 0345) Cardiac Rhythm: Heart block (12/20 0719) Resp:  [12-23] 23 (12/20 0345) BP: (133-158)/(74-99) 133/79 (12/20 0345) SpO2:  [92 %-97 %] 92 % (12/20 0345) Weight:  [142.3 kg-143.3 kg] 142.3 kg (12/19 2000)      Intake/Output from previous day: 12/19 0701 - 12/20 0700 In: 1920 [I.V.:700; NG/GT:970] Out: 15 [Drains:15]   Physical Exam:  Cardiovascular: RRR Pulmonary: Clear to auscultation bilaterally Abdomen: Soft, non tender, bowel sounds present.  Extremities: No LE edema Wound: J tube site mostly clean and dry   Lab Results: CBC:No results for input(s): "WBC", "HGB", "HCT", "PLT" in the last 72 hours. BMET: No results for input(s): "NA", "K", "CL", "CO2", "GLUCOSE", "BUN", "CREATININE", "CALCIUM" in the last 72 hours.  PT/INR:  Recent Labs    06/27/22 1345  LABPROT 14.3  INR 1.1   ABG:  INR: Will add last result for INR, ABG once components are confirmed Will add last 4 CBG results once components are confirmed  Assessment/Plan:  1. CV - SR 2. GI-await swallow study. NPO 3. If swallow study stable, discharge   Markas Aldredge M ZimmermanPA-C 06/28/2022,7:31 AM

## 2022-06-28 NOTE — Hospital Course (Addendum)
  HPI:  This is a 61 yo male w/ hx of esophagectomy complicated by anastomotic leak and subsequently treated with an esophageal stent 7 weeks ago presents for stent removal and re-evaluation of leak. He has been tolerating a liquid diet without any significant change in his chest tube output. Cross section imaging also show no pleural effusion. He presents for an EGD and esophageal stent removal.   Hospital course: He was scheduled for and underwent removal of the esophageal stent on 06/28/2022 by [name].  He tolerated procedure well and was taken to the postanesthesia care unit in stable condition.  He is to undergo a swallowing study on postop day #1 for re evaluation.  Swallow study results showed a persistent leak at the level of the mid thorax. Patient's only complaint was some burping. Patient then underwent an EGD, esophageal stent placement on 12/20. Patient without complaints this am. He does have b/l arm tremors this am and per patient, occasionally has. He had previous hypernatremia and had numerous tremors. Per [name], patient may have clear liquids only. No medications or carbonated beverages by mouth. Tube feedings will be done from 7 pm through 7 am nightly. Wife and patient instructed on the preceding. Of note, last day of Augmentin 07/20/2022. As discussed with [name], he is surgically stable for discharge today.Patient has a VIRTUAL follow up with [name] on 07/07/2022.

## 2022-06-28 NOTE — TOC Transition Note (Addendum)
Transition of Care [facility]) - CM/SW Discharge Note   Patient Details  Name: Keith Moreno MRN: [id number] Date of Birth: 06-19-1961  Transition of Care [facility] CM/SW Contact:  Zenon Mayo, RN Phone Number: 06/28/2022, 11:19 AM   Clinical Narrative:    Patient is for dc today, he has no needs, he is active with [facility] thru the [place], [place] notified [facility] with [facility] that patient will be dc today.  He does not need any new orders, [facility]. He goes to [facility], PCP is Dr. Rondell Reams, [place] is Tracie Harrier [phone] ext [phone].          Patient Goals and CMS Choice          Discharge Placement                       Discharge Plan and Services                                     Social Determinants of Health (SDOH) Interventions     Readmission Risk Interventions     No data to display

## 2022-06-28 NOTE — Anesthesia Procedure Notes (Signed)
Procedure Name: Intubation Date/Time: 06/28/2022 2:58 PM  Performed by: Rande Brunt, CRNAPre-anesthesia Checklist: Patient identified, Emergency Drugs available, Suction available and Patient being monitored Patient Re-evaluated:Patient Re-evaluated prior to induction Oxygen Delivery Method: Circle System Utilized Preoxygenation: Pre-oxygenation with 100% oxygen Induction Type: IV induction Ventilation: Mask ventilation without difficulty Laryngoscope Size: Mac and 4 Grade View: Grade I Tube type: Oral Tube size: 7.5 mm Number of attempts: 1 Airway Equipment and Method: Stylet Placement Confirmation: ETT inserted through vocal cords under direct vision, positive ETCO2 and breath sounds checked- equal and bilateral Secured at: 23 cm Tube secured with: Tape Dental Injury: Teeth and Oropharynx as per pre-operative assessment

## 2022-06-28 NOTE — Anesthesia Preprocedure Evaluation (Addendum)
Anesthesia Evaluation  Patient identified by MRN, date of birth, ID band Patient awake    Reviewed: Allergy & Precautions, H&P , NPO status , Patient's Chart, lab work & pertinent test results, reviewed documented beta blocker date and time   History of Anesthesia Complications Negative for: history of anesthetic complications  Airway Mallampati: II  TM Distance: >3 FB Neck ROM: Full    Dental no notable dental hx. (+) Edentulous Upper, Partial Lower, Dental Advisory Given   Pulmonary shortness of breath, former smoker   Pulmonary exam normal breath sounds clear to auscultation       Cardiovascular hypertension, Pt. on medications and Pt. on home beta blockers (-) angina (-) Past MI and (-) CHF  Rhythm:Regular Rate:Normal    The study is normal. The study is low risk.   No ST deviation was noted.   LV perfusion is normal. There is no evidence of ischemia. There is no evidence of infarction.   Left ventricular function is abnormal. Global function is mildly reduced. Nuclear stress EF: 47 %. The left ventricular ejection fraction is mildly decreased (45-54%). End diastolic cavity size is normal. End systolic cavity size is normal.   Prior study not available for comparison.   1. Fixed perfusion defect at apex with normal wall motion, consistent with artifact 2. Mild systolic dysfunction (EF 09%).  However, he was having PACs/PVCs which could have affected gating for determining EF 3. Low risk study.  Given mild systolic dysfunction, recommend echocardiogram for further evaluation    Neuro/Psych  Headaches PSYCHIATRIC DISORDERS Anxiety        GI/Hepatic negative GI ROS, Neg liver ROS,,,  Endo/Other    Morbid obesity  Renal/GU negative Renal ROSLab Results      Component                Value               Date                      WBC                      5.6                 06/22/2022                HGB                       11.1 (L)            06/22/2022                HCT                      34.0 (L)            06/22/2022                MCV                      87.6                06/22/2022                PLT                      262  06/22/2022             negative genitourinary   Musculoskeletal   Abdominal   Peds  Hematology  (+) Blood dyscrasia, Sickle cell trait   Anesthesia Other Findings   Reproductive/Obstetrics negative OB ROS                             Anesthesia Physical Anesthesia Plan  ASA: 3  Anesthesia Plan: General   Post-op Pain Management: Minimal or no pain anticipated   Induction: Intravenous and Rapid sequence  PONV Risk Score and Plan: 2 and Ondansetron and Dexamethasone  Airway Management Planned: Oral ETT  Additional Equipment: None  Intra-op Plan:   Post-operative Plan: Extubation in OR  Informed Consent: I have reviewed the patients History and Physical, chart, labs and discussed the procedure including the risks, benefits and alternatives for the proposed anesthesia with the patient or authorized representative who has indicated his/her understanding and acceptance.     Dental advisory given  Plan Discussed with: CRNA  Anesthesia Plan Comments:        Anesthesia Quick Evaluation

## 2022-06-29 ENCOUNTER — Encounter (HOSPITAL_COMMUNITY): Payer: Self-pay | Admitting: Thoracic Surgery (Cardiothoracic Vascular Surgery)

## 2022-06-29 LAB — GLUCOSE, CAPILLARY
Glucose-Capillary: 209 mg/dL — ABNORMAL HIGH (ref 70–99)
Glucose-Capillary: 192 mg/dL — ABNORMAL HIGH (ref 70–99)
Glucose-Capillary: 131 mg/dL — ABNORMAL HIGH (ref 70–99)

## 2022-06-29 MED ORDER — OSMOLITE 1.5 CAL PO LIQD: 1710.0000 mL | ORAL | 3 refills | Status: DC

## 2022-06-29 MED ORDER — OSMOLITE 1.5 CAL PO LIQD: 1710.0000 mL | ORAL | Status: DC

## 2022-06-29 NOTE — Progress Notes (Signed)
      [address]       [place] [address]             [phone]       1 Day Post-Op Procedure(s) (LRB): ESOPHAGEAL STENT PLACEMENT (N/A) ESOPHAGOGASTRODUODENOSCOPY (EGD) (N/A)  Subjective: Patient with arm tremors this am;he states he gets them every once in awhile. He had a little nausea earlier this am but no vomiting.  Objective: Vital signs in last 24 hours: Temp:  [97 F (36.1 C)-98.5 F (36.9 C)] 97.8 F (36.6 C) (12/21 0348) Pulse Rate:  [67-96] 67 (12/21 0348) Cardiac Rhythm: Heart block;Bundle branch block (12/20 1906) Resp:  [12-19] 19 (12/21 0348) BP: (126-155)/(70-91) 142/76 (12/21 0348) SpO2:  [91 %-98 %] 95 % (12/21 0348)      Intake/Output from previous day: 12/20 0701 - 12/21 0700 In: 2355 [I.V.:600; QQ/PY:1950; IV Piggyback:50] Out: 500 [Urine:500]   Physical Exam:  Cardiovascular: RRR Pulmonary: Clear to auscultation bilaterally Abdomen: Soft, non tender, bowel sounds present.  Wound: J tube site mostly clean and dry   Lab Results: CBC:No results for input(s): "WBC", "HGB", "HCT", "PLT" in the last 72 hours. BMET: No results for input(s): "NA", "K", "CL", "CO2", "GLUCOSE", "BUN", "CREATININE", "CALCIUM" in the last 72 hours.  PT/INR:  Recent Labs    06/27/22 1345  LABPROT 14.3  INR 1.1    ABG:  INR: Will add last result for INR, ABG once components are confirmed Will add last 4 CBG results once components are confirmed  Assessment/Plan:  1. CV - SR 2. GI-S/p EGD, esophageal stent. He will remain NPO and continue tube feedings. Will discuss with [name] 3. ID-continue Augmentin for post op esophageal leak 4. Will discharge after discussion with surgeon   Nyiah Pianka M ZimmermanPA-C 06/29/2022,7:04 AM

## 2022-06-29 NOTE — Progress Notes (Signed)
Patient has to wait for lunch to be discharged. Needs to tolerate an advance diet. Ride is at the bedside.    Gomez Cleverly SWOT RN

## 2022-06-29 NOTE — TOC CM/SW Note (Signed)
Patient active with [facility] for [facility]. Resumption of care order entered for PA signature. Cory with Alvis Lemmings updated.

## 2022-06-29 NOTE — Anesthesia Postprocedure Evaluation (Signed)
Anesthesia Post Note  Patient: Keith Moreno  Procedure(s) Performed: ESOPHAGEAL STENT PLACEMENT ESOPHAGOGASTRODUODENOSCOPY (EGD)     Patient location during evaluation: PACU Anesthesia Type: General Level of consciousness: awake and alert Pain management: pain level controlled Vital Signs Assessment: post-procedure vital signs reviewed and stable Respiratory status: spontaneous breathing, nonlabored ventilation and respiratory function stable Cardiovascular status: stable and blood pressure returned to baseline Anesthetic complications: no   No notable events documented.  Last Vitals:  Vitals:   06/28/22 2311 06/29/22 0348  BP: (!) 155/81 (!) 142/76  Pulse: 74 67  Resp: 15 19  Temp: 36.9 C 36.6 C  SpO2: 95% 95%    Last Pain:  Vitals:   06/29/22 0348  TempSrc: Oral  PainSc:                  Audry Pili

## 2022-06-29 NOTE — Discharge Summary (Signed)
[address]       [place] [address]             [phone]    Physician Discharge Summary  Patient ID: Keith Moreno MRN: [id number] DOB/AGE: 61/06/1961 61 y.o.  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[place])    Low back pain 11/01/2021     Discharge Diagnoses:  Patient Active Problem List   Diagnosis Date Noted   Mediastinitis 05/30/2022   Pressure injury of skin 05/09/2022   Esophageal anastomotic leak 05/09/2022   Hypercalcemia 05/05/2022   History of esophagectomy 05/05/2022   Dysphagia 05/05/2022   Hypertension    Nocturnal hypoxia    Malnutrition of moderate degree 04/06/2022   Esophageal cancer ([place]) 04/05/2022   Port-A-Cath in place 12/26/2021   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 He has been tolerating a liquid diet without any significant change in his chest tube output. Cross section imaging also show no pleural effusion. He presents for an EGD and esophageal stent removal.   Hospital course: He was scheduled  for and underwent removal of the esophageal stent on 06/28/2022 by [name].  He tolerated procedure well and was taken to the postanesthesia care unit in stable condition.  He is to undergo a swallowing study on postop day #1 for re evaluation.  Swallow study results showed a persistent leak at the level of the mid thorax. Patient's only complaint was some burping. Patient then underwent an EGD, esophageal stent placement on 12/20. Patient without complaints this am. He does have b/l arm tremors this am and per patient, occasionally has. He had previous hypernatremia and had numerous tremors. Per [name], patient may have clear liquids only. No medications or carbonated beverages by mouth. Tube feedings will be done from 7 pm through 7 am nightly. Wife and patient instructed on the preceding. Of note, last day of Augmentin 07/20/2022. As discussed with [name], he is surgically stable for discharge today.Patient has a VIRTUAL follow up with [name] on 07/07/2022.   Consults: None  Significant Diagnostic Studies:  Narrative & Impression  CLINICAL DATA:  Provided history: History of esophagectomy. Additional history obtained from [facility] of robotic assisted Ivor Lewis esophagectomy complicated by anastomotic leak, subsequently treated with esophageal stent. Recent stent removal.   EXAM: ESOPHOGRAM/BARIUM SWALLOW   TECHNIQUE: A single contrast examination was performed using 80 mL Omnipaque 300 water-soluble contrast.   FLUOROSCOPY: Fluoroscopy time: 1 minute, 24 seconds (136.30 mGy).   COMPARISON:  Chest CT 06/26/2022. Esophagram 05/29/2022.   FINDINGS: A problem-oriented water-soluble  esophagram was performed to assess for persistent anastomotic leak.   Previously demonstrated esophageal stent no longer present.   Progressively increasing extraluminal contrast noted at the level of the mid thorax (to the right), compatible with  persistent contrast leak (for instance as seen on series 11, image 8).   The examination was performed by Tsosie Billing, PA-C, and was supervised and interpreted by Dr. Kellie Simmering.   IMPRESSION: Patient with history of robotic assisted Ivor Lewis esophagectomy complicated by anastomotic leak.   A Problem-oriented water soluble contrast esophagram was performed to assess for persistent leak. A persistent leak was noted at the level of the mid thorax (with extraluminal contrast tracking to the right).     Electronically Signed   By: Kellie Simmering D.O.   On: 06/28/2022 10:48      Treatments: surgery:  Esophagogastroscopy - 155 mm covered esophageal stent placement by [name] on 06/28/2022  Discharge Exam: Blood pressure (!) 160/80, pulse 69, temperature 97.6 F (36.4 C), temperature source Oral, resp. rate 16, height 6' (1.829 m), weight (!) 142.3 kg, SpO2 96 %. Cardiovascular: RRR Pulmonary: Clear to auscultation bilaterally Abdomen: Soft, non tender, bowel sounds present.  Wound: J tube site mostly clean and dry   Discharge Medications:  Allergies as of 06/29/2022       Reactions   Procaine    Other reaction(s): Other, Respiratory Distress   Chocolate    Hyperactivity, respiratory distress   Other    Novocaine- respiratory distress        Medication List     TAKE these medications    amoxicillin-clavulanate 400-57 MG/5ML suspension Commonly known as: AUGMENTIN Take 10 mLs (800 mg total) by mouth 2 (two) times daily for 28 days.   cloNIDine 0.1 mg/24hr patch Commonly known as: CATAPRES - Dosed in mg/24 hr Place 1 patch (0.1 mg total) onto the skin once a week.   feeding supplement (OSMOLITE 1.5 CAL) Liqd Place 1,710 mLs into feeding tube daily. Tube feedings to be run via J tube from 7 pm through 7 am nightly only What changed: additional instructions   guaiFENesin 100 MG/5ML liquid Commonly known as: ROBITUSSIN Place 15 mLs into feeding tube  every 4 (four) hours as needed for cough or to loosen phlegm.   HYDROcodone-acetaminophen 7.5-325 mg/15 ml solution Commonly known as: HYCET Place 10 mLs into feeding tube every 6 (six) hours as needed for moderate pain.   lansoprazole 30 MG capsule Commonly known as: Prevacid Take 1 capsule (30 mg total) by mouth 2 (two) times daily before a meal. Sprinkle capsule into water and place in JP tube, then flush with more water   Metformin HCl 500 MG/5ML Soln Place 5 mLs (500 mg total) into feeding tube 2 (two) times daily.   metoprolol tartrate 25 mg/10 mL Susp Commonly known as: LOPRESSOR Place 5 mLs (12.5 mg total) into feeding tube 2 (two) times daily.   Normal Saline Flush 0.9 % Soln 30 mLs by Per J Tube route every 8 (eight) hours.   olopatadine 0.1 % ophthalmic solution Commonly known as: PATANOL Place 1 drop into both eyes 2 (two) times daily.   ondansetron 8 MG disintegrating tablet Commonly known as: ZOFRAN-ODT Take 1 tablet (8 mg total) by mouth every 8 (eight) hours as needed for nausea or vomiting.   Zinc Oxide 12.8 % ointment Commonly known as: TRIPLE PASTE Apply topically 2 (two) times daily. To J-Tube site        Follow-up Information  Lajuana Matte, MD Follow up.   Specialty: Cardiothoracic Surgery Why: Appointment is VIRTUAL;please do not go to the office. [name] will call ou on 07/07/2022 at 2:40 pm Contact information: [address] [place] [address] [phone]                 Signed:  Arnoldo Lenis 06/29/2022, 10:25 AM

## 2022-07-04 MED ORDER — SUGAMMADEX SODIUM 200 MG/2ML IV SOLN
INTRAVENOUS | Status: DC | PRN
  Administered 2022-06-28: 300 mg via INTRAVENOUS

## 2022-07-04 NOTE — Addendum Note (Signed)
Addendum  created 07/04/22 1051 by Josephine Igo, CRNA   Intraprocedure Meds edited

## 2022-07-07 ENCOUNTER — Ambulatory Visit (INDEPENDENT_AMBULATORY_CARE_PROVIDER_SITE_OTHER): Payer: Self-pay | Admitting: Thoracic Surgery (Cardiothoracic Vascular Surgery)

## 2022-07-07 ENCOUNTER — Encounter: Payer: Self-pay | Admitting: Internal Medicine

## 2022-07-07 ENCOUNTER — Ambulatory Visit (INDEPENDENT_AMBULATORY_CARE_PROVIDER_SITE_OTHER): Payer: No Typology Code available for payment source | Admitting: Internal Medicine

## 2022-07-07 ENCOUNTER — Other Ambulatory Visit: Payer: Self-pay

## 2022-07-07 VITALS — BP 139/96 | HR 90 | Temp 97.2°F | Ht 72.0 in | Wt 315.0 lb

## 2022-07-07 DIAGNOSIS — K9189 Other postprocedural complications and disorders of digestive system: Secondary | ICD-10-CM

## 2022-07-07 DIAGNOSIS — J9851 Mediastinitis: Secondary | ICD-10-CM

## 2022-07-07 DIAGNOSIS — C159 Malignant neoplasm of esophagus, unspecified: Secondary | ICD-10-CM

## 2022-07-07 DIAGNOSIS — Z9889 Other specified postprocedural states: Secondary | ICD-10-CM

## 2022-07-07 DIAGNOSIS — Z9049 Acquired absence of other specified parts of digestive tract: Secondary | ICD-10-CM

## 2022-07-07 MED ORDER — AMOXICILLIN-POT CLAVULANATE 400-57 MG/5ML PO SUSR: 800.0000 mg | Freq: Two times a day (BID) | ORAL | 0 refills | Status: DC

## 2022-07-07 NOTE — Progress Notes (Signed)
     [address]       [place] [address]             [phone]       Patient: Home Provider: Office Consent for Telemedicine visit obtained.  Today's visit was completed via a real-time telehealth (see specific modality noted below). The patient/authorized person provided oral consent at the time of the visit to engage in a telemedicine encounter with the present provider at [facility]. The patient/authorized person was informed of the potential benefits, limitations, and risks of telemedicine. The patient/authorized person expressed understanding that the laws that protect confidentiality also apply to telemedicine. The patient/authorized person acknowledged understanding that telemedicine does not provide emergency services and that he or she would need to call 911 or proceed to the nearest hospital for help if such a need arose.   Total time spent in the clinical discussion 10 minutes.  Telehealth Modality: Phone visit (audio only)  I had a telephone visit with Keith Moreno.  Overall doing well.  He is tolerating a clear diet.  He will follow-up in 1 month.  Jaime Dome Bary Leriche

## 2022-07-07 NOTE — Patient Instructions (Signed)
See me after you saw dr Kipp Brood again with stent removed. Otherwise continue amox-clav. A new rx is placed today   Take phenylephrine over the counter pill for nasal drip related cough. Try for 3-5 days and see if that help and take as needed if it does

## 2022-07-07 NOTE — Progress Notes (Signed)
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   MRN: [id number]  No chief complaint on file. Cc - esophageal - gastric anastomosis leak  HPI:  Keith Moreno is a 61 y.o. male hx head/neck cancer s/p ivor-lewis esophagectomy complicated by leak requiring stent here for hospital f/u  I saw in [place] 05/30/2022. Patient was follow up and still have leak on esophagogram 11/20 although improved. He was nontoxic at that time on empiric abx piptazo. No cultures. We transitioned to oral amox-clav plan another 4 weks from 11/21  06/22/22 id clinic f/u He saw dr Kipp Brood with [facility] on 12/07 for follow up via televideo visit He was advised to try liquid PO intake to assess for drain leak which was minimal at that time of visit He reports at time coughing quite a bit but no pattern of progression -- cough is sometimes with clear mucus. Not related to intake trial No f/c Some pain around the jp drain insertion site and j-tube; slight brownish discharge at times at jtube No n/v/rash  Stable loose stool with tube feed  Feels well otherwise  He'll see dr Kipp Brood again tomorrow and discuss jp drain removal   ---------- 12/29 id f/u Stent removed and then placed again 12/20 for ongoing leak Nasal drip with cough worse at  night No f/c No f/c Some nausea controlled with zofran Taking amox-clav no other issue    Allergies  Allergen Reactions   Procaine     Other reaction(s): Other, Respiratory Distress   Chocolate     Hyperactivity, respiratory distress   Other     Novocaine- respiratory distress      Outpatient Medications Prior to Visit  Medication Sig Dispense Refill   amoxicillin-clavulanate (AUGMENTIN) 400-57 MG/5ML suspension Take 10 mLs (800 mg total) by mouth 2 (two) times daily for 28 days. 560 mL 0   cloNIDine (CATAPRES - DOSED IN MG/24 HR) 0.1 mg/24hr patch Place 1 patch (0.1 mg total) onto the skin once a week. 4 patch 12   guaiFENesin (ROBITUSSIN) 100 MG/5ML liquid Place 15 mLs into feeding tube every 4 (four) hours as needed for cough or to loosen phlegm. 120 mL 0   HYDROcodone-acetaminophen (HYCET) 7.5-325 mg/15 ml solution Place 10 mLs into feeding tube every 6 (six) hours as needed for moderate pain. 280 mL 0   lansoprazole (PREVACID) 30 MG capsule Take 1 capsule (30 mg total) by mouth 2 (two) times daily before a meal. Sprinkle capsule into water and place in JP tube, then flush with more water 30 capsule 1   Metformin HCl 500 MG/5ML SOLN Place 5 mLs (500 mg total) into feeding tube 2 (two) times daily. 300 mL 3   Nutritional Supplements (FEEDING SUPPLEMENT, OSMOLITE 1.5 CAL,) LIQD Place 1,710 mLs into feeding tube  daily. Tube feedings to be run via J tube from 7 pm through 7 am nightly only 60000 mL 3   olopatadine (PATANOL) 0.1 % ophthalmic solution Place 1 drop into both eyes 2 (two) times daily.     ondansetron (ZOFRAN-ODT) 8 MG disintegrating tablet Take 1 tablet (8 mg total) by mouth every 8 (eight) hours as needed for nausea or vomiting. 90 tablet 3   Sodium Chloride Flush (NORMAL SALINE FLUSH) 0.9 % SOLN 30 mLs by Per J Tube route every 8 (eight) hours. 2700 mL 3   Zinc Oxide (TRIPLE PASTE) 12.8 % ointment Apply topically 2 (two) times daily. To J-Tube site 56.7 g 0    metoprolol tartrate (LOPRESSOR) 25 mg/10 mL SUSP Place 5 mLs (12.5 mg total) into feeding tube 2 (two) times daily. (Patient not taking: Reported on 06/22/2022) 300 mL 3   No facility-administered medications prior to visit.     Social History   Socioeconomic History   Marital status: Single    Spouse name: Not on file   Number of children: Not on file   Years of education: Not on file   Highest education level: Not on file  Occupational History   Not on file  Tobacco Use   Smoking status: Former    Types: Cigars, Cigarettes, Pipe    Quit date: 22    Years since quitting: 41.0   Smokeless tobacco: Former    Types: Chew    Quit date: 1983  Vaping Use   Vaping Use: Never used  Substance and Sexual Activity   Alcohol use: Yes    Comment: Rarely   Drug use: No   Sexual activity: Yes  Other Topics Concern   Not on file  Social History Narrative   Not on file   Social Determinants of Health   Financial Resource Strain: Not on file  Food Insecurity: No Food Insecurity (06/28/2022)   Hunger Vital Sign    Worried About Running Out of Food in the Last Year: Never true    Ran Out of Food in the Last Year: Never true  Transportation Needs: No Transportation Needs (06/28/2022)   PRAPARE - Hydrologist (Medical): No    Lack of Transportation (Non-Medical): No  Physical Activity: Not on file  Stress: Not on file  Social Connections: Not on file  Intimate Partner Violence: Not At Risk (06/28/2022)   Humiliation, Afraid, Rape, and Kick questionnaire    Fear of Current or Ex-Partner: No    Emotionally Abused: No    Physically Abused: No    Sexually Abused: No      Review of Systems     Objective:    Ht 6' (1.829 m)   Wt (!) 315 lb (142.9 kg)   BMI 42.72 kg/m  Nursing note and vital signs reviewed.  Physical Exam     General/constitutional: no distress, pleasant HEENT: Normocephalic, PER, Conj Clear, EOMI, Oropharynx clear Neck  supple CV: rrr no mrg Lungs: clear to auscultation, normal respiratory effort Abd: Soft, Nontender -- jtube site nontender Ext: no edema Skin: No Rash Neuro: nonfocal MSK: no peripheral joint swelling/tenderness/warmth; back spines nontender      Labs: Lab Results  Component Value Date   WBC 5.6 06/22/2022   HGB 11.1 (L) 06/22/2022   HCT 34.0 (L) 06/22/2022   MCV 87.6 06/22/2022   PLT 262 07/37/1062   Last metabolic panel Lab Results  Component Value Date   GLUCOSE 115 (H) 06/22/2022  NA 136 06/22/2022   K 4.9 06/22/2022   CL 99 06/22/2022   CO2 29 06/22/2022   BUN 22 06/22/2022   CREATININE 1.02 06/22/2022   EGFR 84 06/22/2022   CALCIUM 9.5 06/22/2022   PROT 6.6 06/22/2022   ALBUMIN 2.8 (L) 06/02/2022   BILITOT 0.4 06/22/2022   ALKPHOS 123 06/02/2022   AST 39 (H) 06/22/2022   ALT 48 (H) 06/22/2022   ANIONGAP 7 06/02/2022    Micro:  Serology:  Imaging:  Assessment & Plan:   Problem List Items Addressed This Visit       Cardiovascular and Mediastinum   Mediastinitis     Digestive   Esophageal anastomotic leak - Primary     Lines:  Right chest chemo port   Abx: 11/21-c amox-clav suspension  10/31-11/21 piptazo 10/31-11/13 fluconazole                                                                  Assessment: -Esophageal cancer s/p neoadjuvant chemo -S/p ivor-lewis intrathoracic esophagectomy/placement of j-tube -Esophageogastric anastomosis leak s/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 The chest drain is putting out minimal fluid   We can transition to oral abx and plan for another 4 weeks, to reduce need to assess his chemoport/risk of clabsi  ------------ 12/14  id clinic assessment Improving drainage and doing po intake trial No sign of uncontrolled infection Cough can be of many causes but I do not suspect active pna/empyema/mediastinitis at this time -- he'll see dr Kipp Brood tomorrow and will follow up on plan and advise patient if cough persistent/increasing or f/c let us know Will refill amox-clav for another 4 weeks and stop when drain is removed F/u 2 weeks Labs today    07/07/22 assessment Patient had admission [facility] on 12/19-12/21; old stent removed and f/u study showed persistent leak so a new stent was placed 12/20. He was given amox-clav again to go until 07/20/22 by CTS team. He'll f/u dr Kipp Brood today No sign of sepsis Continue amox-clav until stent is removed -- 1 more month of amox-clav suspension rx given No need for labs today  Continue zofran prn n/v with abx Otc phenylephrine for what appears to be postnasal drip related cough  I have spent a total of 30 minutes of face-to-face and non-face-to-face time, excluding clinical staff time, preparing to see patient, ordering tests and/or medications, and provide counseling the patient    Follow-up: Return in about 6 weeks (around 08/18/2022).      Jabier Mutton, [facility] 07/07/2022, 10:34 AM

## 2022-07-17 ENCOUNTER — Other Ambulatory Visit: Payer: Self-pay

## 2022-07-17 MED ORDER — AMOXICILLIN-POT CLAVULANATE 400-57 MG/5ML PO SUSR: 800.0000 mg | Freq: Two times a day (BID) | ORAL | 0 refills | Status: AC

## 2022-08-03 NOTE — H&P (View-Only) (Signed)
[address]       [place] [address]             [phone]        Budd Bohall [facility] Medical Record #[id number] Date of Birth: [birth year]  Referring: [facility] Care: [facility] Cardiologist:None  Reason for visit:   follow-up  History of Present Illness:     62 yo male w/ hx of esophagectomy complicated by anastomotic leak and subsequently treated with an esophageal stent 7 weeks ago presents for stent removal and re-evaluation of leak. He has been tolerating a liquid diet without any significant change in his chest tube output. Cross section imaging also show no pleural effusion.   Physical Exam: There were no vitals taken for this visit.  Alert NAD Incision clean.   Serous CT output Abdomen, ND J tube in place no peripheral edema     Assessment / Plan:   62yo male w/ Hx of esophagectomy complicated by an anastomotic leak.  S/p stent placement.  Will plan for EGD and stent removal  Mikalyn Hermida Nyoka Cowden Kwynn Schlotter 08/03/2022 9:03 AM

## 2022-08-03 NOTE — Progress Notes (Signed)
[address]       [place] [address]             [phone]        Jaquan Stitely [facility] Medical Record #[id number] Date of Birth: 03/01/1961  Referring: [facility]: [facility] Cardiologist:None  Reason for visit:   follow-up  History of Present Illness:     62 yo male w/ hx of esophagectomy complicated by anastomotic leak and subsequently treated with an esophageal stent 7 weeks ago presents for stent removal and re-evaluation of leak. He has been tolerating a liquid diet without any significant change in his chest tube output. Cross section imaging also show no pleural effusion.   Physical Exam: There were no vitals taken for this visit.  Alert NAD Incision clean.   Serous CT output Abdomen, ND J tube in place no peripheral edema     Assessment / Plan:   62yo male w/ Hx of esophagectomy complicated by an anastomotic leak.  S/p stent placement.  Will plan for EGD and stent removal  Katasha Riga Nyoka Cowden Kriss Ishler 08/03/2022 9:03 AM

## 2022-08-04 ENCOUNTER — Ambulatory Visit (INDEPENDENT_AMBULATORY_CARE_PROVIDER_SITE_OTHER): Payer: Self-pay | Admitting: Thoracic Surgery (Cardiothoracic Vascular Surgery)

## 2022-08-04 ENCOUNTER — Encounter: Payer: Self-pay | Admitting: *Deleted

## 2022-08-04 ENCOUNTER — Other Ambulatory Visit: Payer: Self-pay | Admitting: *Deleted

## 2022-08-04 VITALS — BP 133/99 | HR 106 | Resp 20 | Ht 72.0 in | Wt 318.0 lb

## 2022-08-04 DIAGNOSIS — Z9049 Acquired absence of other specified parts of digestive tract: Secondary | ICD-10-CM

## 2022-08-04 DIAGNOSIS — Z9889 Other specified postprocedural states: Secondary | ICD-10-CM

## 2022-08-09 ENCOUNTER — Other Ambulatory Visit: Payer: Self-pay

## 2022-08-09 ENCOUNTER — Encounter (HOSPITAL_COMMUNITY): Payer: Self-pay | Admitting: Thoracic Surgery (Cardiothoracic Vascular Surgery)

## 2022-08-09 NOTE — Progress Notes (Signed)
PCP - Jule Ser, [place] Cardiologist - denies  PPM/ICD - denies  Chest x-ray - to be done on DOS EKG - 05/09/22 Stress Test - 03/16/22  CPAP - n/a  Fasting Blood Sugar - 130 - 140 Checks Blood Sugar - 2/day A1C - 5.1 on 01/31/22  Blood Thinner Instructions: n/a Patient was instructed: As of today, STOP taking any Aspirin (unless otherwise instructed by your surgeon) Aleve, Naproxen, Ibuprofen, Motrin, Advil, Goody's, BC's, all herbal medications, fish oil, and all vitamins.  ERAS Protcol - n/a  COVID TEST- n/a  Anesthesia review: no  Patient verbally denies any shortness of breath, fever, cough and chest pain during phone call   -------------  SDW INSTRUCTIONS given:  Your procedure is scheduled on Thursday, February 1st, 2024.  Report to [facility] Main Entrance "A" at 08:45 A.M., and check in at the Admitting office.  Call this number if you have problems the morning of surgery:  [phone]   Remember:  Do not eat or drink after midnight the night before your surgery (except the tube feeding as scheduled per [name])    Take these medicines the morning of surgery with A SIP OF WATER: Augmentin, Lansoprazole, eye drops PRN: Zofran, Hydrocodone, Robitussin  Hold Metformin 2 days prior surgery per [name].    The day of surgery:                     Do not wear jewelry,             Do not wear lotions, powders, colognes, or deodorant.            Men may shave face and neck.            Do not bring valuables to the hospital.            [facility] is not responsible for any belongings or valuables.  Do NOT Smoke (Tobacco/Vaping) 24 hours prior to your procedure If you use a CPAP at night, you may bring all equipment for your overnight stay.   Contacts, glasses, dentures or bridgework may not be worn into surgery.      For patients admitted to the hospital, discharge time will be determined by your treatment team.   Patients discharged the day of  surgery will not be allowed to drive home, and someone needs to stay with them for 24 hours.    Special instructions:   [facility]- Preparing For Surgery  Before surgery, you can play an important role. Because skin is not sterile, your skin needs to be as free of germs as possible. You can reduce the number of germs on your skin by washing with CHG (chlorahexidine gluconate) Soap before surgery.  CHG is an antiseptic cleaner which kills germs and bonds with the skin to continue killing germs even after washing.    Oral Hygiene is also important to reduce your risk of infection.  Remember - BRUSH YOUR TEETH THE MORNING OF SURGERY WITH YOUR REGULAR TOOTHPASTE  Please do not use if you have an allergy to CHG or antibacterial soaps. If your skin becomes reddened/irritated stop using the CHG.  Do not shave (including legs and underarms) for at least 48 hours prior to first CHG shower. It is OK to shave your face.  Please follow these instructions carefully.   Shower the NIGHT BEFORE SURGERY and the MORNING OF SURGERY with DIAL Soap.   Pat yourself dry with a CLEAN TOWEL.  Wear  CLEAN PAJAMAS to bed the night before surgery  Place CLEAN SHEETS on your bed the night of your first shower and DO NOT SLEEP WITH PETS.   Day of Surgery: Please shower morning of surgery  Wear Clean/Comfortable clothing the morning of surgery Do not apply any deodorants/lotions.   Remember to brush your teeth WITH YOUR REGULAR TOOTHPASTE.   Questions were answered. Patient verbalized understanding of instructions.

## 2022-08-09 NOTE — Progress Notes (Signed)
Spoke with the pt, he will arrive tom at 0915.

## 2022-08-10 ENCOUNTER — Encounter (HOSPITAL_COMMUNITY): Payer: Self-pay | Admitting: Thoracic Surgery (Cardiothoracic Vascular Surgery)

## 2022-08-10 ENCOUNTER — Ambulatory Visit (HOSPITAL_COMMUNITY): Payer: No Typology Code available for payment source

## 2022-08-10 ENCOUNTER — Ambulatory Visit (HOSPITAL_COMMUNITY): Payer: No Typology Code available for payment source | Admitting: Certified Registered"

## 2022-08-10 ENCOUNTER — Encounter (HOSPITAL_COMMUNITY)
Admission: RE | Disposition: A | Discharge status: Home / Self Care | Payer: Self-pay | Source: Ambulatory Visit | Attending: Thoracic Surgery (Cardiothoracic Vascular Surgery)

## 2022-08-10 ENCOUNTER — Observation Stay (HOSPITAL_COMMUNITY)
Admission: RE | Admit: 2022-08-10 | Discharge: 2022-08-11 | Disposition: A | Discharge status: Home / Self Care | Payer: No Typology Code available for payment source | Source: Ambulatory Visit | Attending: Thoracic Surgery (Cardiothoracic Vascular Surgery) | Admitting: Thoracic Surgery (Cardiothoracic Vascular Surgery)

## 2022-08-10 ENCOUNTER — Ambulatory Visit (HOSPITAL_BASED_OUTPATIENT_CLINIC_OR_DEPARTMENT_OTHER): Payer: No Typology Code available for payment source | Admitting: Certified Registered"

## 2022-08-10 DIAGNOSIS — Z8501 Personal history of malignant neoplasm of esophagus: Secondary | ICD-10-CM | POA: Diagnosis not present

## 2022-08-10 DIAGNOSIS — I10 Essential (primary) hypertension: Secondary | ICD-10-CM

## 2022-08-10 DIAGNOSIS — C159 Malignant neoplasm of esophagus, unspecified: Secondary | ICD-10-CM | POA: Diagnosis not present

## 2022-08-10 DIAGNOSIS — Z9049 Acquired absence of other specified parts of digestive tract: Secondary | ICD-10-CM

## 2022-08-10 DIAGNOSIS — Z4589 Encounter for adjustment and management of other implanted devices: Secondary | ICD-10-CM | POA: Diagnosis present

## 2022-08-10 DIAGNOSIS — Z6841 Body Mass Index (BMI) 40.0 and over, adult: Secondary | ICD-10-CM

## 2022-08-10 DIAGNOSIS — R7309 Other abnormal glucose: Secondary | ICD-10-CM | POA: Insufficient documentation

## 2022-08-10 DIAGNOSIS — K2289 Other specified disease of esophagus: Secondary | ICD-10-CM

## 2022-08-10 DIAGNOSIS — Z9889 Other specified postprocedural states: Principal | ICD-10-CM

## 2022-08-10 DIAGNOSIS — Z87891 Personal history of nicotine dependence: Secondary | ICD-10-CM

## 2022-08-10 HISTORY — PX: ESOPHAGEAL STENT PLACEMENT: SHX5540

## 2022-08-10 HISTORY — PX: ESOPHAGOGASTRODUODENOSCOPY: SHX5428

## 2022-08-10 LAB — CBC
HCT: 38 % — ABNORMAL LOW (ref 39.0–52.0)
Hemoglobin: 12.4 g/dL — ABNORMAL LOW (ref 13.0–17.0)
MCH: 28.4 pg (ref 26.0–34.0)
MCHC: 32.6 g/dL (ref 30.0–36.0)
MCV: 87.2 fL (ref 80.0–100.0)
Platelets: 223 10*3/uL (ref 150–400)
RBC: 4.36 MIL/uL (ref 4.22–5.81)
RDW: 14.3 % (ref 11.5–15.5)
WBC: 4.8 10*3/uL (ref 4.0–10.5)
nRBC: 0 % (ref 0.0–0.2)

## 2022-08-10 LAB — COMPREHENSIVE METABOLIC PANEL
ALT: 58 U/L — ABNORMAL HIGH (ref 0–44)
AST: 53 U/L — ABNORMAL HIGH (ref 15–41)
Albumin: 3.4 g/dL — ABNORMAL LOW (ref 3.5–5.0)
Alkaline Phosphatase: 143 U/L — ABNORMAL HIGH (ref 38–126)
Anion gap: 11 (ref 5–15)
BUN: 18 mg/dL (ref 8–23)
CO2: 25 mmol/L (ref 22–32)
Calcium: 9.5 mg/dL (ref 8.9–10.3)
Chloride: 98 mmol/L (ref 98–111)
Creatinine, Ser: 0.93 mg/dL (ref 0.61–1.24)
GFR, Estimated: >60 mL/min (ref >60)
Glucose, Bld: 115 mg/dL — ABNORMAL HIGH (ref 70–99)
Potassium: 4.3 mmol/L (ref 3.5–5.1)
Sodium: 134 mmol/L — ABNORMAL LOW (ref 135–145)
Total Bilirubin: 0.4 mg/dL (ref 0.3–1.2)
Total Protein: 7.1 g/dL (ref 6.5–8.1)

## 2022-08-10 LAB — APTT: aPTT: 30 seconds (ref 24–36)

## 2022-08-10 LAB — GLUCOSE, CAPILLARY
Glucose-Capillary: 105 mg/dL — ABNORMAL HIGH (ref 70–99)
Glucose-Capillary: 226 mg/dL — ABNORMAL HIGH (ref 70–99)

## 2022-08-10 LAB — PROTIME-INR
INR: 1.1 (ref 0.8–1.2)
Prothrombin Time: 14.3 seconds (ref 11.4–15.2)

## 2022-08-10 SURGERY — EGD (ESOPHAGOGASTRODUODENOSCOPY)
Anesthesia: General

## 2022-08-10 MED ORDER — BISACODYL 5 MG PO TBEC
10.0000 mg | DELAYED_RELEASE_TABLET | Freq: Every day | ORAL | Status: DC
  Filled 2022-08-10 (×2): qty 2

## 2022-08-10 MED ORDER — ACETAMINOPHEN 10 MG/ML IV SOLN: 1000.0000 mg | Freq: Once | INTRAVENOUS | Status: DC | PRN

## 2022-08-10 MED ORDER — FENTANYL CITRATE (PF) 100 MCG/2ML IJ SOLN
INTRAMUSCULAR | Status: DC | PRN
  Administered 2022-08-10: 100 ug via INTRAVENOUS

## 2022-08-10 MED ORDER — LIDOCAINE 2% (20 MG/ML) 5 ML SYRINGE
INTRAMUSCULAR | Status: DC | PRN
  Administered 2022-08-10: 100 mg via INTRAVENOUS

## 2022-08-10 MED ORDER — ACETAMINOPHEN 160 MG/5ML PO SUSP
ORAL | Status: AC
  Filled 2022-08-10: qty 35

## 2022-08-10 MED ORDER — OSMOLITE 1.5 CAL PO LIQD
1710.0000 mL | ORAL | Status: DC
  Administered 2022-08-10: 1710 mL

## 2022-08-10 MED ORDER — FENTANYL CITRATE (PF) 100 MCG/2ML IJ SOLN: 25.0000 ug | INTRAMUSCULAR | Status: DC | PRN

## 2022-08-10 MED ORDER — OLOPATADINE HCL 0.1 % OP SOLN
1.0000 [drp] | Freq: Two times a day (BID) | OPHTHALMIC | Status: DC
  Administered 2022-08-10 – 2022-08-11 (×2): 1 [drp] via OPHTHALMIC
  Filled 2022-08-10: qty 5

## 2022-08-10 MED ORDER — ACETAMINOPHEN 500 MG PO TABS
1000.0000 mg | ORAL_TABLET | Freq: Four times a day (QID) | ORAL | Status: DC
  Filled 2022-08-10: qty 2

## 2022-08-10 MED ORDER — ROCURONIUM BROMIDE 10 MG/ML (PF) SYRINGE
PREFILLED_SYRINGE | INTRAVENOUS | Status: DC | PRN
  Administered 2022-08-10: 10 mg via INTRAVENOUS

## 2022-08-10 MED ORDER — AMISULPRIDE (ANTIEMETIC) 5 MG/2ML IV SOLN: 10.0000 mg | Freq: Once | INTRAVENOUS | Status: DC | PRN

## 2022-08-10 MED ORDER — CLONIDINE HCL 0.1 MG/24HR TD PTWK
0.1000 mg | MEDICATED_PATCH | TRANSDERMAL | Status: DC
  Filled 2022-08-10 (×2): qty 1

## 2022-08-10 MED ORDER — ONDANSETRON HCL 4 MG/2ML IJ SOLN
INTRAMUSCULAR | Status: DC | PRN
  Administered 2022-08-10: 4 mg via INTRAVENOUS

## 2022-08-10 MED ORDER — MIDAZOLAM HCL 2 MG/2ML IJ SOLN
INTRAMUSCULAR | Status: DC | PRN
  Administered 2022-08-10: 2 mg via INTRAVENOUS

## 2022-08-10 MED ORDER — ACETAMINOPHEN 160 MG/5ML PO SOLN
1000.0000 mg | Freq: Four times a day (QID) | ORAL | Status: DC
  Administered 2022-08-10 – 2022-08-11 (×2): 1000 mg via ORAL
  Filled 2022-08-10: qty 40.6
  Filled 2022-08-10: qty 35

## 2022-08-10 MED ORDER — ENOXAPARIN SODIUM 40 MG/0.4ML IJ SOSY
40.0000 mg | PREFILLED_SYRINGE | Freq: Every day | INTRAMUSCULAR | Status: DC
  Filled 2022-08-10: qty 0.4

## 2022-08-10 MED ORDER — FENTANYL CITRATE (PF) 250 MCG/5ML IJ SOLN
INTRAMUSCULAR | Status: AC
  Filled 2022-08-10: qty 5

## 2022-08-10 MED ORDER — SODIUM CHLORIDE 0.9 % IR SOLN
30.0000 mL | Freq: Three times a day (TID) | Status: DC
  Administered 2022-08-10 – 2022-08-11 (×2): 30 mL

## 2022-08-10 MED ORDER — SENNOSIDES-DOCUSATE SODIUM 8.6-50 MG PO TABS: 1.0000 | ORAL_TABLET | Freq: Every day | ORAL | Status: DC

## 2022-08-10 MED ORDER — MIDAZOLAM HCL 2 MG/2ML IJ SOLN
INTRAMUSCULAR | Status: AC
  Filled 2022-08-10: qty 2

## 2022-08-10 MED ORDER — PROPOFOL 10 MG/ML IV BOLUS
INTRAVENOUS | Status: DC | PRN
  Administered 2022-08-10: 170 mg via INTRAVENOUS

## 2022-08-10 MED ORDER — ONDANSETRON HCL 4 MG/2ML IJ SOLN: 4.0000 mg | Freq: Four times a day (QID) | INTRAMUSCULAR | Status: DC | PRN

## 2022-08-10 MED ORDER — HYDROCODONE-ACETAMINOPHEN 7.5-325 MG/15ML PO SOLN
10.0000 mL | Freq: Four times a day (QID) | ORAL | Status: DC | PRN
  Administered 2022-08-10: 10 mL
  Filled 2022-08-10: qty 15

## 2022-08-10 MED ORDER — DEXAMETHASONE SODIUM PHOSPHATE 10 MG/ML IJ SOLN
INTRAMUSCULAR | Status: DC | PRN
  Administered 2022-08-10: 5 mg via INTRAVENOUS

## 2022-08-10 MED ORDER — PANTOPRAZOLE SODIUM 20 MG PO TBEC: 20.0000 mg | DELAYED_RELEASE_TABLET | Freq: Every day | ORAL | Status: DC

## 2022-08-10 MED ORDER — 0.9 % SODIUM CHLORIDE (POUR BTL) OPTIME
TOPICAL | Status: DC | PRN
  Administered 2022-08-10: 1000 mL

## 2022-08-10 MED ORDER — GUAIFENESIN 100 MG/5ML PO LIQD
15.0000 mL | ORAL | Status: DC | PRN
  Administered 2022-08-10: 15 mL
  Filled 2022-08-10 (×2): qty 15

## 2022-08-10 MED ORDER — SUGAMMADEX SODIUM 200 MG/2ML IV SOLN
INTRAVENOUS | Status: DC | PRN
  Administered 2022-08-10: 100 mg via INTRAVENOUS

## 2022-08-10 MED ORDER — SUCCINYLCHOLINE CHLORIDE 200 MG/10ML IV SOSY
PREFILLED_SYRINGE | INTRAVENOUS | Status: DC | PRN
  Administered 2022-08-10: 160 mg via INTRAVENOUS

## 2022-08-10 MED ORDER — CHLORHEXIDINE GLUCONATE 0.12 % MT SOLN: 15.0000 mL | OROMUCOSAL | Status: AC

## 2022-08-10 MED ORDER — ONDANSETRON 4 MG PO TBDP: 8.0000 mg | ORAL_TABLET | Freq: Three times a day (TID) | ORAL | Status: DC | PRN

## 2022-08-10 MED ORDER — METOPROLOL TARTRATE 25 MG/10 ML ORAL SUSPENSION
12.5000 mg | Freq: Two times a day (BID) | ORAL | Status: DC
  Administered 2022-08-10 – 2022-08-11 (×2): 12.5 mg
  Filled 2022-08-10 (×2): qty 5

## 2022-08-10 MED ORDER — PANTOPRAZOLE SODIUM 40 MG PO TBEC
40.0000 mg | DELAYED_RELEASE_TABLET | Freq: Every day | ORAL | Status: DC
  Administered 2022-08-11: 40 mg via ORAL
  Filled 2022-08-10: qty 1

## 2022-08-10 MED ORDER — INSULIN ASPART 100 UNIT/ML IJ SOLN
0.0000 [IU] | Freq: Four times a day (QID) | INTRAMUSCULAR | Status: DC
  Administered 2022-08-10 – 2022-08-11 (×2): 8 [IU] via SUBCUTANEOUS

## 2022-08-10 MED ORDER — LACTATED RINGERS IV SOLN: INTRAVENOUS | Status: DC

## 2022-08-10 MED ORDER — CHLORHEXIDINE GLUCONATE 0.12 % MT SOLN
OROMUCOSAL | Status: AC
  Administered 2022-08-10: 15 mL via OROMUCOSAL
  Filled 2022-08-10: qty 15

## 2022-08-10 MED ORDER — ZINC OXIDE 12.8 % EX OINT
TOPICAL_OINTMENT | Freq: Two times a day (BID) | CUTANEOUS | Status: DC
  Filled 2022-08-10: qty 56.7

## 2022-08-10 SURGICAL SUPPLY — 35 items
BUTTON OLYMPUS DEFENDO 5 PIECE (MISCELLANEOUS) ×1 IMPLANT
CANISTER SUCT 3000ML PPV (MISCELLANEOUS) ×1 IMPLANT
CNTNR URN SCR LID CUP LEK RST (MISCELLANEOUS) ×1 IMPLANT
CONT SPEC 4OZ STRL OR WHT (MISCELLANEOUS)
COVER BACK TABLE 60X90IN (DRAPES) ×1 IMPLANT
FORCEPS GRASP COMBO 8X230 (FORCEP) IMPLANT
GAUZE 4X4 16PLY ~~LOC~~+RFID DBL (SPONGE) ×1 IMPLANT
GAUZE SPONGE 4X4 12PLY STRL (GAUZE/BANDAGES/DRESSINGS) ×1 IMPLANT
GAUZE SPONGE 4X4 12PLY STRL LF (GAUZE/BANDAGES/DRESSINGS) IMPLANT
GLOVE BIO SURGEON STRL SZ7 (GLOVE) ×1 IMPLANT
GLOVE BIO SURGEON STRL SZ7.5 (GLOVE) ×1 IMPLANT
GOWN STRL REUS W/ TWL XL LVL3 (GOWN DISPOSABLE) ×1 IMPLANT
GOWN STRL REUS W/TWL XL LVL3 (GOWN DISPOSABLE) ×1
KIT TURNOVER KIT B (KITS) ×1 IMPLANT
MARKER SKIN DUAL TIP RULER LAB (MISCELLANEOUS) ×1 IMPLANT
MAT PREVALON FULL STRYKER (MISCELLANEOUS) IMPLANT
NS IRRIG 1000ML POUR BTL (IV SOLUTION) ×1 IMPLANT
OIL SILICONE PENTAX (PARTS (SERVICE/REPAIRS)) IMPLANT
PAD ARMBOARD 7.5X6 YLW CONV (MISCELLANEOUS) ×2 IMPLANT
SPONGE T-LAP 18X18 ~~LOC~~+RFID (SPONGE) ×4 IMPLANT
STAPLER VISISTAT (STAPLE) IMPLANT
STENT WALLFLEX 23X15.5 (STENTS) IMPLANT
SUT ETHILON 2 0 PSLX (SUTURE) IMPLANT
SYR 20ML ECCENTRIC (SYRINGE) ×1 IMPLANT
SYR 30ML SLIP (SYRINGE) ×1 IMPLANT
TAPE CLOTH 4X10 WHT NS (GAUZE/BANDAGES/DRESSINGS) IMPLANT
TOWEL GREEN STERILE (TOWEL DISPOSABLE) ×1 IMPLANT
TOWEL GREEN STERILE FF (TOWEL DISPOSABLE) ×1 IMPLANT
TRAY FOL W/BAG SLVR 16FR STRL (SET/KITS/TRAYS/PACK) ×1 IMPLANT
TRAY FOLEY W/BAG SLVR 16FR LF (SET/KITS/TRAYS/PACK) ×1
TUBE CONNECTING 20X1/4 (TUBING) ×1 IMPLANT
TUBING ENDO SMARTCAP (MISCELLANEOUS) ×1 IMPLANT
UNDERPAD 30X36 HEAVY ABSORB (UNDERPADS AND DIAPERS) ×1 IMPLANT
WATER STERILE IRR 1000ML POUR (IV SOLUTION) ×1 IMPLANT
WIRE HYDRA 450CM (MISCELLANEOUS) IMPLANT

## 2022-08-10 NOTE — Transfer of Care (Signed)
Immediate Anesthesia Transfer of Care Note  Patient: Keith Moreno  Procedure(s) Performed: ESOPHAGOGASTRODUODENOSCOPY (EGD) ESOPHAGEAL STENT REMOVAL  Patient Location: PACU  Anesthesia Type:General  Level of Consciousness: awake, alert , and oriented  Airway & Oxygen Therapy: Patient Spontanous Breathing and Patient connected to nasal cannula oxygen  Post-op Assessment: Report given to RN  Post vital signs: Reviewed and stable  Last Vitals:  Vitals Value Taken Time  BP 119/98 08/10/22 1239  Temp    Pulse 90 08/10/22 1240  Resp 0 08/10/22 1240  SpO2 95 % 08/10/22 1240  Vitals shown include unvalidated device data.  Last Pain:  Vitals:   08/10/22 1015  TempSrc:   PainSc: 0-No pain      Patients Stated Pain Goal: 0 (90/93/11 2162)  Complications: No notable events documented.

## 2022-08-10 NOTE — Anesthesia Postprocedure Evaluation (Signed)
Anesthesia Post Note  Patient: Keith Moreno  Procedure(s) Performed: ESOPHAGOGASTRODUODENOSCOPY (EGD) ESOPHAGEAL STENT REMOVAL     Patient location during evaluation: PACU Anesthesia Type: General Level of consciousness: awake and alert Pain management: pain level controlled Vital Signs Assessment: post-procedure vital signs reviewed and stable Respiratory status: spontaneous breathing, nonlabored ventilation and respiratory function stable Cardiovascular status: blood pressure returned to baseline Postop Assessment: no apparent nausea or vomiting Anesthetic complications: no   No notable events documented.  Last Vitals:  Vitals:   08/10/22 1445 08/10/22 1503  BP: (!) 140/69 (!) 152/89  Pulse: 78 82  Resp: (!) 24 (!) 24  Temp:    SpO2: 94% 97%    Last Pain:  Vitals:   08/10/22 1503  TempSrc:   PainSc: 0-No pain                 Marthenia Rolling

## 2022-08-10 NOTE — Discharge Summary (Signed)
Physician Discharge Summary  Patient ID: Keith Moreno MRN: [id number] DOB/AGE: 01-12-1961 62 y.o.  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 ([place])    Low back pain 11/01/2021   Discharge Diagnoses:   Patient Active Problem List   Diagnosis Date Noted   H/O esophagectomy 08/10/2022   Mediastinitis 05/30/2022   Pressure injury of skin 05/09/2022   Esophageal anastomotic leak 05/09/2022   Hypercalcemia 05/05/2022   History of esophagectomy 05/05/2022   Dysphagia 05/05/2022   Hypertension    Nocturnal hypoxia    Malnutrition of moderate degree 04/06/2022   Esophageal cancer ([place]) 04/05/2022   Port-A-Cath in place 12/26/2021   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 He has been tolerating a liquid diet without any significant change in his chest tube output. Cross section imaging also show no pleural effusion.   [name] reviewed the patient's studies and determined EGD with stent removal would provide this patient the best  long term treatment. He discussed the treatment options with him as well as the risks and benefits of surgery. Keith Moreno was agreeable to proceed with EGD and stent removal.  Hospital Course: Keith Moreno arrived at [facility] and was brought to the operating room on 08/10/22. He underwent EGD and esophageal stent removal. He tolerated the procedure well and was transferred to the PACU in stable condition. The patient was started on clear liquid diet overnight.  He tolerated this without difficulty.  He will be transitioned to a dysphagia diet.  He will continue to receive tube feedings nightly which will be decreased as oral intake improves.  He will be transitioned to all oral medications.  He has remained clinically stable overnight and is ready for discharge home today.  Consults: None  Significant Diagnostic Studies: N/A  Treatments: Esophagogastroduodenoscopy with removal of esophageal stent  Discharge Exam: performed by Wynelle Beckmann PA-C Blood pressure 124/68, pulse 76, temperature 98.6 F (37 C), temperature source Oral, resp. rate 19, height 6' (1.829 m), weight (!) 142.9 kg, SpO2 94 %.  General appearance: alert, cooperative, and no distress Neurologic: intact Heart: regular rate and rhythm, S1, S2 normal, no murmur, click, rub or gallop Lungs: clear to auscultation bilaterally Abdomen: Hypoactive bowel sounds, no tenderness or distension Extremities: edema trace Wound: Clean and dry, no sign of infection. Clean and dry dressings over J tube and right bulb drain.   Discharge disposition: 01-Home or Self Care  Allergies as of 08/11/2022       Reactions   Procaine Shortness Of Breath   OK to use Lidocaine for IV starts  Chocolate    Hyperactivity, respiratory distress   Other    Novocaine- respiratory distress        Medication List     STOP taking these medications    HYDROcodone-acetaminophen 7.5-325 mg/15 ml solution Commonly known as:  HYCET Replaced by: HYDROcodone-acetaminophen 7.5-325 MG tablet   Metformin HCl 500 MG/5ML Soln Replaced by: metFORMIN 500 MG tablet   metoprolol tartrate 25 mg/10 mL Susp Commonly known as: LOPRESSOR Replaced by: metoprolol tartrate 25 MG tablet       TAKE these medications    acetaminophen 500 MG tablet Commonly known as: TYLENOL Take 2 tablets (1,000 mg total) by mouth every 6 (six) hours as needed for mild pain or fever.   amoxicillin-clavulanate 400-57 MG/5ML suspension Commonly known as: AUGMENTIN Take 10 mLs (800 mg total) by mouth 2 (two) times daily for 28 days.   cloNIDine 0.1 mg/24hr patch Commonly known as: CATAPRES - Dosed in mg/24 hr Place 1 patch (0.1 mg total) onto the skin once a week. What changed: when to take this   feeding supplement (OSMOLITE 1.5 CAL) Liqd Place 1,710 mLs into feeding tube daily. Tube feedings to be run via J tube from 7 pm through 7 am nightly only   guaiFENesin 100 MG/5ML liquid Commonly known as: ROBITUSSIN Take 15 mLs by mouth every 4 (four) hours as needed for cough or to loosen phlegm. What changed: how to take this   HYDROcodone-acetaminophen 7.5-325 MG tablet Commonly known as: NORCO Take 1 tablet by mouth every 6 (six) hours as needed for moderate pain. Replaces: HYDROcodone-acetaminophen 7.5-325 mg/15 ml solution   lansoprazole 30 MG capsule Commonly known as: Prevacid Take 1 capsule (30 mg total) by mouth 2 (two) times daily before a meal. What changed: additional instructions   metFORMIN 500 MG tablet Commonly known as: GLUCOPHAGE Take 1 tablet (500 mg total) by mouth 2 (two) times daily with a meal. Replaces: Metformin HCl 500 MG/5ML Soln   metoprolol tartrate 25 MG tablet Commonly known as: LOPRESSOR Take 0.5 tablets (12.5 mg total) by mouth 2 (two) times daily. Replaces: metoprolol tartrate 25 mg/10 mL Susp   Normal Saline Flush 0.9 % Soln 30 mLs by Per J Tube route every 8 (eight) hours.   olopatadine 0.1  % ophthalmic solution Commonly known as: PATANOL Place 1 drop into both eyes 2 (two) times daily.   ondansetron 8 MG disintegrating tablet Commonly known as: ZOFRAN-ODT Take 1 tablet (8 mg total) by mouth every 8 (eight) hours as needed for nausea or vomiting.   Zinc Oxide 12.8 % ointment Commonly known as: TRIPLE PASTE Apply topically 2 (two) times daily. To J-Tube site What changed:  how much to take when to take this reasons to take this        Follow-up Information     Lajuana Matte, MD Follow up on 08/17/2022.   Specialty: Cardiothoracic Surgery Why: Follow up at 10:00AM Contact information: [address] [place] [address] [phone]                 Signed:  Ellwood Handler, PA-C  08/11/2022, 8:53 AM

## 2022-08-10 NOTE — Anesthesia Procedure Notes (Signed)
Procedure Name: Intubation Date/Time: 08/10/2022 12:18 PM  Performed by: Barrington Ellison, CRNAPre-anesthesia Checklist: Patient identified, Emergency Drugs available, Suction available and Patient being monitored Patient Re-evaluated:Patient Re-evaluated prior to induction Oxygen Delivery Method: Circle System Utilized Preoxygenation: Pre-oxygenation with 100% oxygen Induction Type: IV induction Ventilation: Mask ventilation without difficulty Laryngoscope Size: Mac and 4 Grade View: Grade I Tube type: Oral Tube size: 7.5 mm Number of attempts: 1 Airway Equipment and Method: Stylet and Oral airway Placement Confirmation: ETT inserted through vocal cords under direct vision, positive ETCO2 and breath sounds checked- equal and bilateral Secured at: 22 cm Tube secured with: Tape Dental Injury: Teeth and Oropharynx as per pre-operative assessment

## 2022-08-10 NOTE — Hospital Course (Addendum)
History of Present Illness:     62 yo male w/ hx of esophagectomy complicated by anastomotic leak and subsequently treated with an esophageal stent 7 weeks ago presents for stent removal and re-evaluation of leak. He has been tolerating a liquid diet without any significant change in his chest tube output. Cross section imaging also show no pleural effusion.   [name] reviewed the patient's studies and determined EGD with stent removal would provide this patient the best long term treatment. He discussed the treatment options with him as well as the risks and benefits of surgery. Keith Moreno was agreeable to proceed with EGD and stent removal.  Hospital Course: Keith Moreno arrived at [facility] and was brought to the operating room on 08/10/22. He underwent EGD and esophageal stent removal. He tolerated the procedure well and was transferred to the PACU in stable condition. The patient was started on clear liquid diet overnight.  He tolerated this without difficulty.  He will be transitioned to a dysphagia diet.  He will continue to receive tube feedings nightly which will be decreased as oral intake improves.  He will be transitioned to all oral medications.  He has remained clinically stable overnight and is ready for discharge home today.

## 2022-08-10 NOTE — Interval H&P Note (Signed)
History and Physical Interval Note:  08/10/2022 10:58 AM  Keith Moreno  has presented today for surgery, with the diagnosis of ESOPHAGEAL CANCER.  The various methods of treatment have been discussed with the patient and family. After consideration of risks, benefits and other options for treatment, the patient has consented to  Procedure(s): ESOPHAGOGASTRODUODENOSCOPY (EGD) (N/A) ESOPHAGEAL STENT REMOVAL (N/A) as a surgical intervention.  The patient's history has been reviewed, patient examined, no change in status, stable for surgery.  I have reviewed the patient's chart and labs.  Questions were answered to the patient's satisfaction.     Dub Maclellan Bary Leriche

## 2022-08-10 NOTE — Op Note (Signed)
      [address]       [place] [address]             [phone]        08/10/2022  Patient:  Diamantina Monks Pre-Op Dx: History of esophagectomy with anastomotic leak Post-op Dx: Same Procedure: - Esophagogastroscopy -Removal of esophageal stent  Surgeon and Role:      * Eion Timbrook, Lucile Crater, MD - Primary  Anesthesia  general EBL:  34m Blood Administration: 0 Specimen: None   Counts: correct   Indications: 62year old male status post Ivor Lewis esophagectomy.  Postoperatively he did develop an anastomotic leak which was controlled with stent placement.  6 weeks ago the stent was removed but there is still was an apparent leak.  He comes in today for stent removal and another assessment. Findings: After stent removal the mucosa appeared well-healed.  Operative Technique: After the risks, benefits and alternatives were thoroughly discussed, the patient was brought to the operative theatre.  Anesthesia was induced. The patient was prepped and draped in normal sterile fashion.  An appropriate surgical pause was performed, and pre-operative antibiotics were dosed accordingly.  The gastroscope was advanced through the oropharynx into the cervical esophagus under direct visualization.  The scope was passed into the stomach.  The scope was then pulled back, and the stent was grasped and removed.  We then advanced the endoscope again and visualized the anastomosis.  There was good apposition and no evidence of a leak.    The scope was then removed.  The patient tolerated the procedure without any immediate complications, and was transferred to the PACU in stable condition.  Wilberta Dorvil OBary Leriche

## 2022-08-10 NOTE — Anesthesia Preprocedure Evaluation (Addendum)
Anesthesia Evaluation  Patient identified by MRN, date of birth, ID band Patient awake    Reviewed: Allergy & Precautions, NPO status , Patient's Chart, lab work & pertinent test results, reviewed documented beta blocker date and time   History of Anesthesia Complications Negative for: history of anesthetic complications  Airway Mallampati: II  TM Distance: >3 FB Neck ROM: Full    Dental  (+) Missing, Poor Dentition, Edentulous Upper,    Pulmonary former smoker   Pulmonary exam normal        Cardiovascular hypertension, Pt. on medications and Pt. on home beta blockers Normal cardiovascular exam     Neuro/Psych  Headaches  negative psych ROS   GI/Hepatic Neg liver ROS,GERD  Medicated,,ESOPHAGEAL CANCER   Endo/Other    Morbid obesity  Renal/GU negative Renal ROS  negative genitourinary   Musculoskeletal negative musculoskeletal ROS (+)    Abdominal   Peds  Hematology negative hematology ROS (+)   Anesthesia Other Findings Day of surgery medications reviewed with patient.  Reproductive/Obstetrics negative OB ROS                             Anesthesia Physical Anesthesia Plan  ASA: 3  Anesthesia Plan: General   Post-op Pain Management: Ofirmev IV (intra-op)*   Induction: Intravenous  PONV Risk Score and Plan: 2 and Treatment may vary due to age or medical condition, Ondansetron, Dexamethasone and Midazolam  Airway Management Planned: Oral ETT  Additional Equipment: None  Intra-op Plan:   Post-operative Plan: Extubation in OR  Informed Consent: I have reviewed the patients History and Physical, chart, labs and discussed the procedure including the risks, benefits and alternatives for the proposed anesthesia with the patient or authorized representative who has indicated his/her understanding and acceptance.     Dental advisory given  Plan Discussed with: CRNA  Anesthesia  Plan Comments:        Anesthesia Quick Evaluation

## 2022-08-10 NOTE — Discharge Instructions (Addendum)
Dysphagia Eating Plan, Pureed This eating plan helps people who are not able to bite or chew food. It is also for people who do not have good control of their tongues. Pureed foods are smooth. They are prepared without lumps so that they can be swallowed safely. Work with your health care provider, nutrition and diet specialist (dietitian), or your speech-language pathologist to make sure you are following the eating plan safely and getting all the nutrients you need. What are tips for following this plan? Cooking If a food is not originally a smooth texture, you may be able to eat the food after: Pureeing it. This can be done with a blender. Moistening it. This can be done by adding juice, cooking liquid, gravy, or sauce to a dry food and then pureeing it. For example, you may have bread if you soak it in milk and puree it. If a food is too thin, you may add a commercial thickener, corn starch, rice cereal, or potato flakes to thicken it. Strain and throw away any excess liquid or liquid that separates from a solid pureed food before eating. Strain lumps, chunks, pulp, and seeds from pureed foods before eating. Reheat foods slowly to prevent a tough crust from forming. Meal planning Eat a variety of foods to get all the nutrients you need. Add dry milk or protein powder to food to increase calories and protein content. Follow your meal plan as told by your dietitian. General information You may eat foods that are soft and have a pudding-like texture. Do not eat foods that you have to chew. If you have to chew the food, then you cannot eat it. Avoid foods that are hard, dry, sticky, chunky, lumpy, or stringy. Also avoid foods with nuts, seeds, raisins, skins, or pulp. You may be instructed to thicken liquids. Follow your health care provider's instructions about how to do this and to what consistency. The foods you may eat are usually eaten with a spoon. You can use a spoon or fork to test the  texture of a food: Using a spoon, you can do a spoon tilt test to check if food holds together on the spoon and is not too firm, too sticky, or too thick. Food should hold its shape on the spoon and slide off easily with almost no food left on the spoon. With food on a fork, the food should sit on a mound or pile on top of the fork and should not seep or drip through the fork prongs continuously. What foods should I eat?  Fruits Pureed fruits such as melons and apples without seeds or pulp. Mashed bananas. Mashed avocado. Fruit juices without pulp or seeds. Vegetables Pureed vegetables. Smooth tomato paste or sauce. Mashed or pureed potatoes without skin. Grains Soft breads, pancakes, [place] toast, muffins, and bread stuffing pureed to a smooth, moist texture, without nuts or seeds. Cooked cereals that have a pudding-like consistency, such as hot wheat cereal or farina. Pureed oatmeal. Pureed, well-cooked pasta and rice. Meats and other proteins Pureed meat, poultry, and fish. Smooth pate or liverwurst. Smooth souffles. Pureed beans such as lentils. Pureed eggs. Smooth nut and seed butters. Pureed tofu. Dairy Yogurt. Milk. Pureed cottage cheese. Nutritional dairy drinks or shakes. Cream cheese. Smooth pudding, ice cream, sherbet, and malts. Fats and oils Butter. Margarine. Vegetable oils. Smooth and strained gravy. Sour cream. Mayonnaise. Smooth sauces such as white sauce, cheese sauce, or hollandaise sauce. Sweets and desserts Moistened and pureed cookies and cakes. Whipped  topping. Gelatin. Pudding pops. Seasonings and other foods Finely ground spices. Jelly. Honey. Pureed casseroles. Strained soups. Pureed sandwiches. Beverages Anything prepared at the consistency recommended by your health care provider. The items listed above may not be a complete list of foods and beverages you can eat. Contact a dietitian for more information. What foods should I avoid? Fruits Whole fresh, frozen,  canned, or dried fruits that have not been pureed. Stringy fruits, such as pineapple or coconut. Watermelon with seeds. Dried fruit or fruit leather. Vegetables Whole vegetables. Stringy vegetables such as celery. Tomatoes or tomato sauce with seeds. Fried vegetables. Grains Oatmeal. Dry cereals. Hard breads. Breads with seeds or nuts. Whole pasta, rice, or other grains. Whole pancakes, waffles, biscuits, muffins, or rolls. Meats and other proteins Whole or ground meat, fish, or poultry. Dried or cooked lentils or legumes that have been cooked but not mashed or pureed. Non-pureed eggs. Nuts and seeds. Crunchy peanut butter. Whole tofu or other meat alternatives. Dairy Cheese cubes or slices. Non-pureed cottage cheese. Yogurt with fruit chunks. Fats and oils All fats and sauces that have lumps or chunks. Sweets and desserts Solid desserts. Sticky, chewy sweets such as licorice and caramel. Candy with nuts or coconut. Seasonings and other foods Coarse or seeded herbs and spices. Chunky preserves. Jams with seeds. Whole sandwiches. Non-pureed casseroles. Chunky soups. The items listed above may not be a complete list of foods and beverages you should avoid. Contact a dietitian for more information. Summary Pureed foods can be helpful for those who have difficulty chewing or problems controlling or moving food with their tongues. On this dysphagia eating plan, you may eat foods that are soft and have a pudding-like texture. Do not eat foods that you have to chew. If you have to chew the food, then you cannot eat it. You may be instructed to thicken liquids. Follow your health care provider's instructions about how to do this and to what consistency. This information is not intended to replace advice given to you by your health care provider. Make sure you discuss any questions you have with your health care provider. Document Revised: 08/18/2021 Document Reviewed: 08/18/2021 Elsevier Patient  Education  [place].  If any questions or concerns arise, please do not hesitate to contact our office at [phone]

## 2022-08-11 ENCOUNTER — Encounter (HOSPITAL_COMMUNITY): Payer: Self-pay | Admitting: Thoracic Surgery (Cardiothoracic Vascular Surgery)

## 2022-08-11 DIAGNOSIS — Z4589 Encounter for adjustment and management of other implanted devices: Secondary | ICD-10-CM | POA: Diagnosis not present

## 2022-08-11 LAB — BASIC METABOLIC PANEL
Anion gap: 8 (ref 5–15)
BUN: 17 mg/dL (ref 8–23)
CO2: 25 mmol/L (ref 22–32)
Calcium: 9.4 mg/dL (ref 8.9–10.3)
Chloride: 101 mmol/L (ref 98–111)
Creatinine, Ser: 0.81 mg/dL (ref 0.61–1.24)
GFR, Estimated: >60 mL/min (ref >60)
Glucose, Bld: 248 mg/dL — ABNORMAL HIGH (ref 70–99)
Potassium: 4.2 mmol/L (ref 3.5–5.1)
Sodium: 134 mmol/L — ABNORMAL LOW (ref 135–145)

## 2022-08-11 LAB — CBC
HCT: 33.4 % — ABNORMAL LOW (ref 39.0–52.0)
Hemoglobin: 10.8 g/dL — ABNORMAL LOW (ref 13.0–17.0)
MCH: 27.9 pg (ref 26.0–34.0)
MCHC: 32.3 g/dL (ref 30.0–36.0)
MCV: 86.3 fL (ref 80.0–100.0)
Platelets: 225 10*3/uL (ref 150–400)
RBC: 3.87 MIL/uL — ABNORMAL LOW (ref 4.22–5.81)
RDW: 14.2 % (ref 11.5–15.5)
WBC: 4.9 10*3/uL (ref 4.0–10.5)
nRBC: 0 % (ref 0.0–0.2)

## 2022-08-11 LAB — GLUCOSE, CAPILLARY: Glucose-Capillary: 214 mg/dL — ABNORMAL HIGH (ref 70–99)

## 2022-08-11 MED ORDER — GUAIFENESIN 100 MG/5ML PO LIQD: 15.0000 mL | ORAL | 0 refills | Status: DC | PRN

## 2022-08-11 MED ORDER — ACETAMINOPHEN 500 MG PO TABS: 1000.0000 mg | ORAL_TABLET | Freq: Four times a day (QID) | ORAL | 0 refills | Status: AC | PRN

## 2022-08-11 MED ORDER — METFORMIN HCL 500 MG PO TABS: 500.0000 mg | ORAL_TABLET | Freq: Two times a day (BID) | ORAL | 3 refills | Status: DC

## 2022-08-11 MED ORDER — HYDROCODONE-ACETAMINOPHEN 7.5-325 MG PO TABS: 1.0000 | ORAL_TABLET | Freq: Four times a day (QID) | ORAL | 0 refills | Status: DC | PRN

## 2022-08-11 MED ORDER — LANSOPRAZOLE 30 MG PO CPDR: 30.0000 mg | DELAYED_RELEASE_CAPSULE | Freq: Two times a day (BID) | ORAL | 1 refills | Status: DC

## 2022-08-11 MED ORDER — METOPROLOL TARTRATE 25 MG PO TABS: 12.5000 mg | ORAL_TABLET | Freq: Two times a day (BID) | ORAL | 3 refills | Status: DC

## 2022-08-11 NOTE — Progress Notes (Addendum)
      [address]       [place] [address]             [phone]      1 Day Post-Op Procedure(s) (LRB): ESOPHAGOGASTRODUODENOSCOPY (EGD) (N/A) ESOPHAGEAL STENT REMOVAL (N/A) Subjective: Patient has no complaints this AM.  Objective: Vital signs in last 24 hours: Temp:  [98 F (36.7 C)-98.9 F (37.2 C)] 98.6 F (37 C) (02/02 0749) Pulse Rate:  [76-91] 76 (02/02 0749) Cardiac Rhythm: Normal sinus rhythm (02/02 0746) Resp:  [12-29] 19 (02/02 0749) BP: (107-156)/(63-98) 124/68 (02/02 0749) SpO2:  [89 %-99 %] 94 % (02/02 0749) Weight:  [142.9 kg] 142.9 kg (02/01 0958)  Hemodynamic parameters for last 24 hours:    Intake/Output from previous day: 02/01 0701 - 02/02 0700 In: 95 [NG/GT:95] Out: 5 [Drains:5] Intake/Output this shift: No intake/output data recorded.  General appearance: alert, cooperative, and no distress Neurologic: intact Heart: regular rate and rhythm, S1, S2 normal, no murmur, click, rub or gallop Lungs: clear to auscultation bilaterally Abdomen: Hypoactive bowel sounds, no tenderness or distension Extremities: edema trace Wound: Clean and dry, no sign of infection. Clean and dry dressings over J tube and right bulb drain.   Lab Results: Recent Labs    08/10/22 0948 08/11/22 0032  WBC 4.8 4.9  HGB 12.4* 10.8*  HCT 38.0* 33.4*  PLT 223 225   BMET:  Recent Labs    08/10/22 0948 08/11/22 0032  NA 134* 134*  K 4.3 4.2  CL 98 101  CO2 25 25  GLUCOSE 115* 248*  BUN 18 17  CREATININE 0.93 0.81  CALCIUM 9.5 9.4    PT/INR:  Recent Labs    08/10/22 0948  LABPROT 14.3  INR 1.1   ABG    Component Value Date/Time   PHART 7.450 05/09/2022 0824   HCO3 25.8 05/09/2022 0824   TCO2 27 05/09/2022 0824   ACIDBASEDEF 0.3 04/06/2022 0332   O2SAT 100 05/09/2022 0824   CBG (last 3)  Recent Labs    08/10/22 1308 08/10/22 2350 08/11/22 0638  GLUCAP 105* 226* 214*    Assessment/Plan: S/P Procedure(s)  (LRB): ESOPHAGOGASTRODUODENOSCOPY (EGD) (N/A) ESOPHAGEAL STENT REMOVAL (N/A)  CV: NSR, 1st degree AV block. Hemodynamically stable.  Pulm: Saturating well on RA. Trace left pleural effusion on CXR yesterday. Continue ambulation.  GI: 5cc output/24 hours from bulb drain. No nausea. Will start dysphagia 1 diet and PO meds. +BM.   Endo: Hyperglycemia CBGs 105/226/214, on SSI. Will restart metformin at discharge.   Renal: No UO recorded. Cr 0.81  ID: Hx of esophageal leak. Continue Augmentin until 02/05.  Dispo: Continue tube feeds. Start dysphagia 1 diet and PO meds. Stable for discharge home today.    LOS: 1 day    Magdalene River, PA-C 08/11/2022  Agree with above Dysphagia 1 diet Oral meds, and nightly tubefeeds Home today Yareliz Thorstenson O Rashon Rezek

## 2022-08-11 NOTE — Progress Notes (Signed)
J-tube feeding stopped. J-tube site cleansed  with saline, wife taught on how to do dressing. Very receptive to teaching.

## 2022-08-11 NOTE — TOC Transition Note (Signed)
Transition of Care [facility]) - CM/SW Discharge Note   Patient Details  Name: Keith Moreno MRN: [id number] Date of Birth: 1961/07/05  [facility] of [facility]) CM/SW Contact:  Tom-Johnson, Renea Ee, RN Phone Number: 08/11/2022, 10:30 AM   Clinical Narrative:     Patient is scheduled for discharge today. Had Esophageal Stent removal with drain placement on 08/10/22. Has Hx of Esophageal cancer with Nocturnal J. Tube feeds. Patient states he gets his supplies from the [facility]. Denies any TOC needs. Wife to transport at discharge. No further TOC needs noted.          Final next level of care: Home/Self Care Barriers to Discharge: Barriers Resolved   Patient Goals and CMS Choice CMS Medicare.gov Compare Post Acute Care list provided to:: Patient Choice offered to / list presented to : NA  Discharge Placement                  Patient to be transferred to facility by: Significant other      Discharge Plan and Services Additional resources added to the After Visit Summary for                  DME Arranged: N/A DME Agency: NA       HH Arranged: NA HH Agency: NA        Social Determinants of Health ([place]) Interventions [place]: No Food Insecurity (06/28/2022)  Housing: Low Risk  (06/28/2022)  Transportation Needs: No Transportation Needs (06/28/2022)  Utilities: Not At Risk (06/28/2022)  Tobacco Use: Medium Risk (08/11/2022)     Readmission Risk Interventions     No data to display

## 2022-08-11 NOTE — Progress Notes (Signed)
Mobility Specialist Progress Note    08/11/22 0912  Mobility  Activity Ambulated independently in hallway  Level of Assistance Standby assist, set-up cues, supervision of patient - no hands on  Assistive Device None  Distance Ambulated (ft) 420 ft  Activity Response Tolerated well  $Mobility charge 1 Mobility   Pt received sitting EOB and agreeable. No complaints on walk. Returned to sitting EOB with call bell in reach.    Hildred Alamin Mobility Specialist  Please Psychologist, sport and exercise or Rehab Office at [phone]

## 2022-08-16 ENCOUNTER — Other Ambulatory Visit: Payer: Self-pay

## 2022-08-16 ENCOUNTER — Inpatient Hospital Stay: Payer: No Typology Code available for payment source | Attending: Physician Assistant

## 2022-08-16 DIAGNOSIS — Z95828 Presence of other vascular implants and grafts: Secondary | ICD-10-CM

## 2022-08-16 DIAGNOSIS — C155 Malignant neoplasm of lower third of esophagus: Secondary | ICD-10-CM | POA: Insufficient documentation

## 2022-08-16 DIAGNOSIS — Z452 Encounter for adjustment and management of vascular access device: Secondary | ICD-10-CM | POA: Insufficient documentation

## 2022-08-16 DIAGNOSIS — Z79899 Other long term (current) drug therapy: Secondary | ICD-10-CM | POA: Insufficient documentation

## 2022-08-16 MED ORDER — HEPARIN SOD (PORK) LOCK FLUSH 100 UNIT/ML IV SOLN
500.0000 [IU] | Freq: Once | INTRAVENOUS | Status: AC
  Administered 2022-08-16: 500 [IU]

## 2022-08-16 MED ORDER — SODIUM CHLORIDE 0.9% FLUSH
10.0000 mL | Freq: Once | INTRAVENOUS | Status: AC
  Administered 2022-08-16: 10 mL

## 2022-08-17 ENCOUNTER — Ambulatory Visit (INDEPENDENT_AMBULATORY_CARE_PROVIDER_SITE_OTHER): Payer: No Typology Code available for payment source | Admitting: Thoracic Surgery (Cardiothoracic Vascular Surgery)

## 2022-08-17 VITALS — BP 140/90 | HR 85 | Resp 20 | Ht 72.0 in | Wt 318.0 lb

## 2022-08-17 DIAGNOSIS — C159 Malignant neoplasm of esophagus, unspecified: Secondary | ICD-10-CM

## 2022-08-17 MED ORDER — AMLODIPINE BESYLATE 10 MG PO TABS: 5.0000 mg | ORAL_TABLET | Freq: Once | ORAL | Status: DC

## 2022-08-17 NOTE — Progress Notes (Signed)
      [address]       [place] [address]             [phone]        Keith Moreno [place] Medical Record #[id number] Date of Birth: May 18, 1961  Referring: [facility] Care: [facility] Cardiologist:None  Reason for visit:   follow-up  History of Present Illness:     Mr. Keith Moreno comes in for his appointment following removal of the esophageal stent.  He has been tolerating a full liquid diet without any complications.  He is not had any additional drainage since leaving the hospital in his chest tube.  He has not had a good appetite.  Physical Exam: BP (!) 140/90   Pulse 85   Resp 20   Ht 6' (1.829 m)   Wt (!) 318 lb (144.2 kg)   SpO2 96% Comment: RA  BMI 43.13 kg/m   Alert NAD Incision clean.  Chest tube was removed. Abdomen, ND.  Skin around the G-tube site is healing. Trace peripheral edema       Assessment / Plan:   62 year old male status post esophagectomy with anastomotic leak.  This is healed well.  He was instructed to advance his diet, not to for tender foods.  In regards to his feeding through the jejunostomy tube, I instructed him to decrease his running time from 10 PM to 8 AM, and to eat several small meals throughout the day.  I will also restart his amlodipine 5 mg, and will restart his lisinopril next week to get him off of the clonidine patches.  He will also speak with his primary care physician about restarting his diabetes medication and coming off of the metformin liquids.  I will follow-up with him in 1 week.   Lajuana Matte 08/17/2022 9:59 AM

## 2022-08-18 ENCOUNTER — Encounter: Payer: Self-pay | Admitting: Internal Medicine

## 2022-08-18 ENCOUNTER — Ambulatory Visit (INDEPENDENT_AMBULATORY_CARE_PROVIDER_SITE_OTHER): Payer: No Typology Code available for payment source | Admitting: Internal Medicine

## 2022-08-18 ENCOUNTER — Other Ambulatory Visit: Payer: Self-pay

## 2022-08-18 VITALS — BP 144/87 | HR 92 | Temp 97.3°F | Ht 72.0 in | Wt 318.0 lb

## 2022-08-18 DIAGNOSIS — C155 Malignant neoplasm of lower third of esophagus: Secondary | ICD-10-CM

## 2022-08-18 DIAGNOSIS — K9189 Other postprocedural complications and disorders of digestive system: Secondary | ICD-10-CM | POA: Diagnosis not present

## 2022-08-18 NOTE — Patient Instructions (Signed)
After today you can stop your antibiotics  F/u with dr Kipp Brood as discussed with him   Monitor for fever, chill, persistent worsening chest pain/dyspnea then let me know  No need to schedule any clinic visit with me at this time   Will forward chart to your pcp to ask her to investigate your chronic cough

## 2022-08-18 NOTE — Progress Notes (Signed)
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   MRN: [id number]  Chief Complaint  Patient presents with   Follow-up    Esophageal anastomotic leak   Cc - esophageal - gastric anastomosis leak  HPI:  Keith Moreno is a 62 y.o. male hx head/neck cancer s/p ivor-lewis esophagectomy complicated by leak requiring stent here for hospital f/u  I saw in [place] 05/30/2022. Patient was follow up and still have leak on esophagogram 11/20 although improved. He was nontoxic at that time on empiric abx piptazo. No cultures. We transitioned to oral amox-clav plan another 4 weks from 11/21  06/22/22 id clinic f/u He saw dr Kipp Brood with [facility] on 12/07 for follow up via televideo visit He was advised to try liquid PO intake to assess for drain leak which was minimal at that time of visit He reports at time coughing quite a bit but no pattern of progression -- cough is sometimes with clear mucus. Not related to intake trial No f/c Some pain around the jp drain insertion site and j-tube; slight brownish discharge at times at jtube No n/v/rash  Stable loose stool with tube feed  Feels well otherwise  He'll see dr Kipp Brood again tomorrow and discuss jp drain removal   ---------- 12/29 id f/u Stent  removed and then placed again 12/20 for ongoing leak Nasal drip with cough worse at night No f/c No f/c Some nausea controlled with zofran Taking amox-clav no other issue  08/18/22 id f/u Patient had readmission 08/10/22 for esophgeal stent study --> no leak so removed Started on oral diet Will see dr Kipp Brood this week for phone visit Feeding tube remains No f/c/chest pain Chronic post nasal cough worse with laying down No dyspnea  Still taking oral augmentin  Allergies  Allergen Reactions   Procaine Shortness Of Breath    OK to use Lidocaine for IV starts      Chocolate     Hyperactivity, respiratory distress   Other     Novocaine- respiratory distress      Outpatient Medications Prior to Visit  Medication Sig Dispense Refill   acetaminophen (TYLENOL) 500 MG tablet Take 2 tablets (1,000 mg total) by mouth every 6 (six) hours as needed for mild pain or fever. 30 tablet 0   cloNIDine (CATAPRES - DOSED IN MG/24 HR) 0.1 mg/24hr patch Place 1 patch (0.1 mg total) onto the skin once a week. (Patient taking differently: Place 0.1 mg onto the skin every Sunday.) 4 patch 12   guaiFENesin (ROBITUSSIN) 100 MG/5ML liquid Take 15 mLs by mouth every 4 (four) hours as needed for cough or to loosen phlegm. 120 mL 0   HYDROcodone-acetaminophen (NORCO) 7.5-325 MG tablet Take 1 tablet by mouth every 6 (six) hours as  needed for moderate pain. 30 tablet 0   lansoprazole (PREVACID) 30 MG capsule Take 1 capsule (30 mg total) by mouth 2 (two) times daily before a meal. 30 capsule 1   metFORMIN (GLUCOPHAGE) 500 MG tablet Take 1 tablet (500 mg total) by mouth 2 (two) times daily with a meal. 60 tablet 3   metoprolol tartrate (LOPRESSOR) 25 MG tablet Take 0.5 tablets (12.5 mg total) by mouth 2 (two) times daily. 60 tablet 3   Nutritional Supplements (FEEDING SUPPLEMENT, OSMOLITE 1.5 CAL,) LIQD Place 1,710 mLs into feeding tube daily. Tube feedings to be run via J tube from 7 pm through 7 am nightly  only 60000 mL 3   olopatadine (PATANOL) 0.1 % ophthalmic solution Place 1 drop into both eyes 2 (two) times daily.     ondansetron (ZOFRAN-ODT) 8 MG disintegrating tablet Take 1 tablet (8 mg total) by mouth every 8 (eight) hours as needed for nausea or vomiting. 90 tablet 3   Sodium Chloride Flush (NORMAL SALINE FLUSH) 0.9 % SOLN 30 mLs by Per J Tube route every 8 (eight) hours. 2700 mL 3   Zinc Oxide (TRIPLE PASTE) 12.8 % ointment Apply topically 2 (two) times daily. To J-Tube site (Patient taking differently: Apply 1 Application topically as needed for irritation. To J-Tube site) 56.7 g 0   Facility-Administered Medications Prior to Visit  Medication Dose Route Frequency Provider Last Rate Last Admin   amLODipine (NORVASC) tablet 5 mg  5 mg Oral Once Lightfoot, Lucile Crater, MD         Social History   Socioeconomic History   Marital status: Single    Spouse name: Not on file   Number of children: Not on file   Years of education: Not on file   Highest education level: Not on file  Occupational History   Not on file  Tobacco Use   Smoking status: Former    Types: Cigars, Cigarettes, Pipe    Quit date: 37    Years since quitting: 41.1   Smokeless tobacco: Former    Types: Chew    Quit date: 1983  Vaping Use   Vaping Use: Never used  Substance and Sexual Activity   Alcohol use: Not Currently    Comment: Rarely   Drug use: No   Sexual activity: Yes  Other Topics Concern   Not on file  Social History Narrative   Not on file   Social Determinants of Health   Financial Resource Strain: Not on file  Food Insecurity: No Food Insecurity (06/28/2022)   Hunger Vital Sign    Worried About Running Out of Food in the Last Year: Never true    Ran Out of Food in the Last Year: Never true  Transportation Needs: No Transportation Needs (06/28/2022)   PRAPARE - Hydrologist (Medical): No    Lack of Transportation (Non-Medical): No  Physical Activity: Not  on file  Stress: Not on file  Social Connections: Not on file  Intimate Partner Violence: Not At Risk (06/28/2022)   Humiliation, Afraid, Rape, and Kick questionnaire    Fear of Current or Ex-Partner: No    Emotionally Abused: No    Physically Abused: No    Sexually Abused: No      Review of Systems    All other ros negative  Objective:    BP (!) 144/87   Pulse 92   Temp (!) 97.3 F (36.3 C) (Temporal)   Ht 6' (1.829 m)  Wt (!) 318 lb (144.2 kg)   BMI 43.13 kg/m  Nursing note and vital signs reviewed.  Physical Exam     General/constitutional: no distress, pleasant HEENT: Normocephalic, PER, Conj Clear, EOMI, Oropharynx clear Neck supple CV: rrr no mrg Lungs: clear to auscultation, normal respiratory effort Abd: Soft, Nontender Ext: no edema Skin: No Rash Neuro: nonfocal MSK: no peripheral joint swelling/tenderness/warmth; back spines nontender        Labs: Lab Results  Component Value Date   WBC 4.9 08/11/2022   HGB 10.8 (L) 08/11/2022   HCT 33.4 (L) 08/11/2022   MCV 86.3 08/11/2022   PLT 225 123456   Last metabolic panel Lab Results  Component Value Date   GLUCOSE 248 (H) 08/11/2022   NA 134 (L) 08/11/2022   K 4.2 08/11/2022   CL 101 08/11/2022   CO2 25 08/11/2022   BUN 17 08/11/2022   CREATININE 0.81 08/11/2022   EGFR 84 06/22/2022   CALCIUM 9.4 08/11/2022   PROT 7.1 08/10/2022   ALBUMIN 3.4 (L) 08/10/2022   BILITOT 0.4 08/10/2022   ALKPHOS 143 (H) 08/10/2022   AST 53 (H) 08/10/2022   ALT 58 (H) 08/10/2022   ANIONGAP 8 08/11/2022    Micro:  Serology:  Imaging:  Assessment & Plan:   Problem List Items Addressed This Visit       Digestive   Esophageal cancer ([place]) - Primary   Esophageal anastomotic leak    Lines:  Right chest chemo port   Abx: 11/21-c amox-clav suspension  10/31-11/21 piptazo 10/31-11/13 fluconazole                                                                  Assessment: -Esophageal  cancer s/p neoadjuvant chemo -S/p ivor-lewis intrathoracic esophagectomy/placement of j-tube -Esophageogastric anastomosis leak s/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 The chest drain is putting out minimal fluid   We can transition to oral abx and plan for another 4 weeks, to reduce need to assess his chemoport/risk of clabsi  ------------ 12/14 id clinic assessment Improving drainage and doing po intake trial No sign of uncontrolled infection Cough can be of many causes but I do not suspect active pna/empyema/mediastinitis at this time -- he'll see dr Kipp Brood tomorrow and will follow up on plan and advise patient if cough persistent/increasing or f/c let us know Will refill amox-clav for another 4 weeks and stop when drain is removed F/u 2 weeks Labs today    07/07/22 assessment Patient had admission [facility] 12[facility]19-12/[facility]; old stent removed and f/u study showed persistent leak so a new stent was placed 12/20. He was given amox-clav again to go until 07/20/22 by CTS team. He'll f/u dr Kipp Brood today No sign of sepsis Continue amox-clav until stent is removed -- 1 more month of amox-clav suspension rx given No need for labs today  Continue zofran prn n/v with abx Otc phenylephrine for what appears to be postnasal drip related cough   08/18/22 Patient had esophageal stent removed 08/10/22.  Chest xray unconcerning at that time No fever, chill Chronic post-nasal cough  He can finish 1 more day of oral antibiotics and then stop. Advise him to monitor for any fever, chill, worsening persistent chest pain or dypsnea then let us know  F/u as needed for that only  Will send message to his pcp to w/u for chronic cough  at his request    Follow-up: Return if symptoms worsen or fail to improve.      Jabier Mutton, [facility] 08/18/2022, 11:23 AM

## 2022-08-25 ENCOUNTER — Ambulatory Visit: Payer: No Typology Code available for payment source | Admitting: Thoracic Surgery (Cardiothoracic Vascular Surgery)

## 2022-08-25 DIAGNOSIS — C159 Malignant neoplasm of esophagus, unspecified: Secondary | ICD-10-CM

## 2022-08-25 NOTE — Progress Notes (Signed)
     [address]       [place] [address]             [phone]       Patient: Home Provider: Office Consent for Telemedicine visit obtained.  Today's visit was completed via a real-time telehealth (see specific modality noted below). The patient/authorized person provided oral consent at the time of the visit to engage in a telemedicine encounter with the present provider at [facility]. The patient/authorized person was informed of the potential benefits, limitations, and risks of telemedicine. The patient/authorized person expressed understanding that the laws that protect confidentiality also apply to telemedicine. The patient/authorized person acknowledged understanding that telemedicine does not provide emergency services and that he or she would need to call 911 or proceed to the nearest hospital for help if such a need arose.   Total time spent in the clinical discussion 10 minutes.  Telehealth Modality: Phone visit (audio only)  I had a telephone visit with Keith Moreno.  Overall he is doing well.  He is tolerating a regular diet without any major issues.  He unfortunately is not taking in enough calories to completely come off the tube feeds.  I encouraged him to to continue with p.o. intake.  He will follow-up with [place] after his nutritional visit in a few weeks.

## 2022-08-28 ENCOUNTER — Telehealth: Payer: Self-pay | Admitting: *Deleted

## 2022-08-28 NOTE — Telephone Encounter (Signed)
Patient contacted the office stating the last suture used to hold his feeding tube in place has fallen out. Patient states the tube pulled out approximately 2 inches but he was able to slide it back in. Patient states he has had no issues with feedings. Per [name], patient advised to be careful with the tube, no further suturing is required. Patient verbalized understanding.

## 2022-09-12 ENCOUNTER — Telehealth: Payer: Self-pay | Admitting: Hematology and Oncology

## 2022-09-12 NOTE — Telephone Encounter (Signed)
Called patient per 3/5 secure chat to schedule f/u with MD on 3/27. Patient scheduled and notified.

## 2022-09-15 ENCOUNTER — Ambulatory Visit (INDEPENDENT_AMBULATORY_CARE_PROVIDER_SITE_OTHER): Payer: Self-pay | Admitting: Thoracic Surgery (Cardiothoracic Vascular Surgery)

## 2022-09-15 ENCOUNTER — Telehealth: Payer: Self-pay | Admitting: Radiology

## 2022-09-15 DIAGNOSIS — Z9889 Other specified postprocedural states: Secondary | ICD-10-CM

## 2022-09-15 DIAGNOSIS — Z9049 Acquired absence of other specified parts of digestive tract: Secondary | ICD-10-CM

## 2022-09-15 NOTE — Telephone Encounter (Signed)
Please see message below from [employer] and advise. Thanks.   [place], Tisha Georgeanna Lea, [place], RT Gwinda Passe  I've been contacted by an attorney in [place] representing the workplace of Mr.Bancroft with [employer]. They contacting us wanting [place] to provide them with a referral from Dr. Lorin Mercy, MD regarding FCE. We have not seen this patient since 11/29/21. Would Dr. Lorin Mercy want do another referral for FCE just to add push/pull on the FCE for [employer]. I not sure about this. Can you advise on please.    Thanks Twanna Hy

## 2022-09-15 NOTE — Progress Notes (Signed)
     [address]       [place] [address]             [phone]       Patient: Home Provider: Office Consent for Telemedicine visit obtained.  Today's visit was completed via a real-time telehealth (see specific modality noted below). The patient/authorized person provided oral consent at the time of the visit to engage in a telemedicine encounter with the present provider at [facility]. The patient/authorized person was informed of the potential benefits, limitations, and risks of telemedicine. The patient/authorized person expressed understanding that the laws that protect confidentiality also apply to telemedicine. The patient/authorized person acknowledged understanding that telemedicine does not provide emergency services and that he or she would need to call 911 or proceed to the nearest hospital for help if such a need arose.   Total time spent in the clinical discussion 10 minutes.  Telehealth Modality: Phone visit (audio only)  I had a telephone visit with Keith Moreno.  He states that he is doing better with his diet but is not quite ready to get rid of the feeding tube.  He will follow-up virtually in 3 weeks.

## 2022-09-19 ENCOUNTER — Other Ambulatory Visit: Payer: Self-pay

## 2022-09-19 MED ORDER — LANSOPRAZOLE 30 MG PO CPDR: 30.0000 mg | DELAYED_RELEASE_CAPSULE | Freq: Two times a day (BID) | ORAL | 2 refills | Status: DC

## 2022-09-19 MED ORDER — LANSOPRAZOLE 30 MG PO CPDR: 30.0000 mg | DELAYED_RELEASE_CAPSULE | Freq: Two times a day (BID) | ORAL | 0 refills | Status: DC

## 2022-09-22 ENCOUNTER — Telehealth: Payer: Self-pay | Admitting: Orthopaedic Surgery

## 2022-09-22 NOTE — Telephone Encounter (Signed)
Ramond [employer] caseworker called on behalf of pt. She state she spoke with Gwinda Passe and Gwinda Passe was to get back to her about weather pt need another referral. Dorian Pod phone number is [phone].

## 2022-09-22 NOTE — Telephone Encounter (Signed)
I am not sure. Thank you for your help with this

## 2022-09-26 ENCOUNTER — Telehealth: Payer: Self-pay | Admitting: Orthopaedic Surgery

## 2022-09-26 NOTE — Telephone Encounter (Signed)
Keith Moreno from [facility] attorney office wanting to speak to [place]..[phone]-

## 2022-09-27 NOTE — Telephone Encounter (Signed)
noted 

## 2022-10-03 ENCOUNTER — Telehealth: Payer: Self-pay | Admitting: Orthopaedic Surgery

## 2022-10-03 NOTE — Progress Notes (Unsigned)
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 Stage IIB (cT2,cN0, cM0) 11/13/2021: Presented to the emergency department complaining of dysphagia and food impaction. Underwent EGD with Dr. Hilarie Fredrickson. Findings revealed medium sized fungating mass in the lower third of th esophagus, 35 cm from the incisors and extending into the GE junction.   It appears to have arisen in the background of Barrett's esophagus, the mass was nonobstructing and partially circumferential. Pathology confirmed moderately to poorly differentiated adenocarcinoma.  11/30/2021: PET scan showed  hypermetabolic activity in the distal esophagus with an SUV of 7.7 with thickening measuring up to 15 mm and spanning just to above the GE junction.  Diffuse mediastinal adenopathy showed dense calcification in all nearly symmetric findings.  12/06/2021: Established care with Radiation Oncology with Dr. Kyung Rudd and Shona Simpson PA-C 12/12/2021: Establish care with Medication Oncology with Dr. Narda Rutherford and Dede Query PA-C 12/26/2021: Started Carbo/Paclitaxel with radiation. End chemotherapy on 01/23/2022 (last dose on 01/30/2022 held due to neutropenia). Last radiation 02/02/2022.   Interval History:  Keith Moreno 62 y.o. male with medical history significant for localized esophageal adenocarcinoma who presents for a follow up visit. The patient's last visit was on 02/14/2022. In the interim since the last visit he had a prolonged hospitalization from 10/27-11/28/2023 with some shorter hospitalizations in Dec 2023 and Feb 2024.   On exam  today Keith Moreno is accompanied by his wife. He reports that his esophageal anastomosis is still leaking.  He reports that he is appetite is fair and he is getting a lot of his water in through his G-tube.  He reports he has quite a lot of trouble swallowing and feels like he may pull into much air.  He has difficulty belching and has to massage it out of his esophagus.  He reports that his bowels do require the use of a stool softener and occasional laxative.  He reports he is doing his best to try to eat watermelon and soups to increase his intake.  Fortunately he is not having any pain though he does have a lot of issues with cough at night.  He reports he is not able to sleep much because of these coughing fits.  Otherwise his weight has been improving and he is not having any other concerning symptoms at this time.Marland Kitchen He denies fevers, chills, shortness of breath, chest pain or cough. He has no other complaints.  A full 10 point ROS was otherwise negative.  MEDICAL HISTORY:  Past Medical History:  Diagnosis Date   Anxiety    Cancer ([place])    Esophageal Cancer   COVID 2022   mild case   Dyspnea    r/t chemo and radiation   Headache    History of kidney stones    Hypertension    Kidney infection    at age 55   Pneumonia    62 years old   Pre-diabetes    PTSD (post-traumatic stress disorder)    per pt, if woken up suddenly he "cocks back arm" as if to punch but usually wakes up enough to come to before he hits anyone    SURGICAL HISTORY: Past  Surgical History:  Procedure Laterality Date   BIOPSY  11/13/2021   Procedure: BIOPSY;  Surgeon: Jerene Bears, MD;  Location: [facility];  Service: Gastroenterology;;   BIOPSY  12/14/2021   Procedure: BIOPSY;  Surgeon: Irving Copas., MD;  Location: [place];  Service: Gastroenterology;;   BRONCHIAL BRUSHINGS  12/14/2021   Procedure: BRONCHIAL BRUSHINGS;  Surgeon: Collene Gobble, MD;  Location: [place];  Service:  Cardiopulmonary;;   BRONCHIAL NEEDLE ASPIRATION BIOPSY  12/14/2021   Procedure: BRONCHIAL NEEDLE ASPIRATION BIOPSIES;  Surgeon: Collene Gobble, MD;  Location: [place];  Service: Cardiopulmonary;;   COLONOSCOPY  2018   CYST EXCISION  2022   left side of head   ESOPHAGEAL STENT PLACEMENT N/A 06/27/2022   Procedure: ESOPHAGEAL STENT REMOVAL;  Surgeon: Lajuana Matte, MD;  Location: [place];  Service: Thoracic;  Laterality: N/A;   ESOPHAGEAL STENT PLACEMENT N/A 06/28/2022   Procedure: ESOPHAGEAL STENT PLACEMENT;  Surgeon: Lajuana Matte, MD;  Location: [facility];  Service: Thoracic;  Laterality: N/A;   ESOPHAGEAL STENT PLACEMENT N/A 08/10/2022   Procedure: ESOPHAGEAL STENT REMOVAL;  Surgeon: Lajuana Matte, MD;  Location: [place];  Service: Thoracic;  Laterality: N/A;   ESOPHAGECTOMY  04/05/2022   ESOPHAGOGASTRODUODENOSCOPY N/A 11/13/2021   Procedure: ESOPHAGOGASTRODUODENOSCOPY (EGD);  Surgeon: Jerene Bears, MD;  Location: [facility] ENDOSCOPY;  Service: Gastroenterology;  Laterality: N/A;   ESOPHAGOGASTRODUODENOSCOPY N/A 04/05/2022   Procedure: ESOPHAGOGASTRODUODENOSCOPY (EGD);  Surgeon: Lajuana Matte, MD;  Location: [facility] OR;  Service: Thoracic;  Laterality: N/A;   ESOPHAGOGASTRODUODENOSCOPY N/A 05/09/2022   Procedure: ESOPHAGOGASTRODUODENOSCOPY (EGD);  Surgeon: Lajuana Matte, MD;  Location[facility] OR;  Service: Thoracic;  Laterality: N/A;  esophageal stent placement   ESOPHAGOGASTRODUODENOSCOPY N/A 06/27/2022   Procedure: ESOPHAGOGASTRODUODENOSCOPY (EGD);  Surgeon: Lajuana Matte, MD;  Location: [place];  Service: Thoracic;  Laterality: N/A;   ESOPHAGOGASTRODUODENOSCOPY N/A 06/28/2022   Procedure: ESOPHAGOGASTRODUODENOSCOPY (EGD);  Surgeon: Lajuana Matte, MD;  Location[facility] OR;  Service: Thoracic;  Laterality: N/A;   ESOPHAGOGASTRODUODENOSCOPY N/A 08/10/2022   Procedure: ESOPHAGOGASTRODUODENOSCOPY (EGD);  Surgeon: Lajuana Matte, MD;  Location: [facility] OR;  Service:  Thoracic;  Laterality: N/A;   ESOPHAGOGASTRODUODENOSCOPY (EGD) WITH PROPOFOL N/A 12/14/2021   Procedure: ESOPHAGOGASTRODUODENOSCOPY (EGD) WITH PROPOFOL;  Surgeon: Irving Copas., MD;  Location: [place];  Service: Gastroenterology;  Laterality: N/A;   EUS N/A 12/14/2021   Procedure: UPPER ENDOSCOPIC ULTRASOUND (EUS) LINEAR;  Surgeon: Irving Copas., MD;  Location: [place];  Service: Gastroenterology;  Laterality: N/A;   FINE NEEDLE ASPIRATION  12/14/2021   Procedure: FINE NEEDLE ASPIRATION (FNA) LINEAR;  Surgeon: Irving Copas., MD;  Location: [place];  Service: Gastroenterology;;   FOREIGN BODY REMOVAL  11/13/2021   Procedure: FOREIGN BODY REMOVAL;  Surgeon: Jerene Bears, MD;  Location: [facility] ENDOSCOPY;  Service: Gastroenterology;;   HUMERUS FRACTURE SURGERY Right    INTERCOSTAL NERVE BLOCK  04/05/2022   Procedure: INTERCOSTAL NERVE BLOCK;  Surgeon: Lajuana Matte, MD;  Location: [place];  Service: Thoracic;;   IR IMAGING GUIDED PORT INSERTION  12/23/2021   IR THORACENTESIS ASP PLEURAL SPACE W/IMG GUIDE  04/19/2022   LAPAROSCOPIC JEJUNOSTOMY N/A 05/09/2022   Procedure: LAPAROSCOPIC JEJUNOSTOMY;  Surgeon: Lajuana Matte, MD;  Location: [place];  Service: Thoracic;  Laterality: N/A;   MULTIPLE TOOTH EXTRACTIONS  2015   NODE DISSECTION  04/05/2022   Procedure: NODE DISSECTION;  Surgeon: Lajuana Matte, MD;  Location: [place];  Service: Thoracic;;  TONSILLECTOMY AND ADENOIDECTOMY     as a child   VIDEO ASSISTED THORACOSCOPY Right 05/09/2022   Procedure: VIDEO ASSISTED THORACOSCOPY;  Surgeon: Lajuana Matte, MD;  Location: [facility] OR;  Service: Thoracic;  Laterality: Right;   VIDEO BRONCHOSCOPY WITH ENDOBRONCHIAL ULTRASOUND Bilateral 12/14/2021   Procedure: VIDEO BRONCHOSCOPY WITH ENDOBRONCHIAL ULTRASOUND;  Surgeon: Collene Gobble, MD;  Location: [place];  Service: Cardiopulmonary;  Laterality: Bilateral;    SOCIAL HISTORY: Social  History   Socioeconomic History   Marital status: Single    Spouse name: Not on file   Number of children: Not on file   Years of education: Not on file   Highest education level: Not on file  Occupational History   Not on file  Tobacco Use   Smoking status: Former    Types: Cigars, Cigarettes, Pipe    Quit date: 63    Years since quitting: 41.2   Smokeless tobacco: Former    Types: Chew    Quit date: 1983  Vaping Use   Vaping Use: Never used  Substance and Sexual Activity   Alcohol use: Not Currently    Comment: Rarely   Drug use: No   Sexual activity: Yes  Other Topics Concern   Not on file  Social History Narrative   Not on file   Social Determinants of Health   Financial Resource Strain: Not on file  Food Insecurity: No Food Insecurity (06/28/2022)   Hunger Vital Sign    Worried About Running Out of Food in the Last Year: Never true    Ran Out of Food in the Last Year: Never true  Transportation Needs: No Transportation Needs (06/28/2022)   PRAPARE - Hydrologist (Medical): No    Lack of Transportation (Non-Medical): No  Physical Activity: Not on file  Stress: Not on file  Social Connections: Not on file  Intimate Partner Violence: Not At Risk (06/28/2022)   Humiliation, Afraid, Rape, and Kick questionnaire    Fear of Current or Ex-Partner: No    Emotionally Abused: No    Physically Abused: No    Sexually Abused: No    FAMILY HISTORY: Family History  Problem Relation Age of Onset   Hypertension Mother    Diabetes Mother    Bipolar disorder Mother    Hypertension Father    Kidney cancer Sister    Colon cancer Paternal Uncle    Cancer Cousin    Bladder Cancer Cousin    Cancer Cousin     ALLERGIES:  is allergic to procaine, chocolate, and other.  MEDICATIONS:  Current Outpatient Medications  Medication Sig Dispense Refill   acetaminophen (TYLENOL) 500 MG tablet Take 2 tablets (1,000 mg total) by mouth every 6 (six)  hours as needed for mild pain or fever. 30 tablet 0   Alogliptin Benzoate 12.5 MG TABS Take 12.5 mg by mouth daily.     benzonatate (TESSALON) 100 MG capsule Take 100 mg by mouth 3 (three) times daily as needed for cough.     carboxymethylcellulose (REFRESH PLUS) 0.5 % SOLN Place 1 drop into both eyes 2 (two) times daily as needed. 0.5 %     guaiFENesin (ROBITUSSIN) 100 MG/5ML liquid Take 15 mLs by mouth every 4 (four) hours as needed for cough or to loosen phlegm. 120 mL 0   lansoprazole (PREVACID) 30 MG capsule Take 1 capsule (30 mg total) by mouth 2 (two) times daily before a meal. 180 capsule 0   lisinopril (  ZESTRIL) 40 MG tablet Take 40 mg by mouth daily.     Nutritional Supplements (FEEDING SUPPLEMENT, OSMOLITE 1.5 CAL,) LIQD Place 1,710 mLs into feeding tube daily. Tube feedings to be run via J tube from 7 pm through 7 am nightly only 60000 mL 3   olopatadine (PATANOL) 0.1 % ophthalmic solution Place 1 drop into both eyes 2 (two) times daily.     ondansetron (ZOFRAN-ODT) 8 MG disintegrating tablet Take 1 tablet (8 mg total) by mouth every 8 (eight) hours as needed for nausea or vomiting. 90 tablet 3   pioglitazone (ACTOS) 30 MG tablet Take 30 mg by mouth daily.     Sodium Chloride Flush (NORMAL SALINE FLUSH) 0.9 % SOLN 30 mLs by Per J Tube route every 8 (eight) hours. 2700 mL 3   Zinc Oxide (TRIPLE PASTE) 12.8 % ointment Apply topically 2 (two) times daily. To J-Tube site (Patient taking differently: Apply 1 Application topically as needed for irritation. To J-Tube site) 56.7 g 0   HYDROcodone-acetaminophen (NORCO) 7.5-325 MG tablet Take 1 tablet by mouth every 6 (six) hours as needed for moderate pain. (Patient not taking: Reported on 10/04/2022) 30 tablet 0   Current Facility-Administered Medications  Medication Dose Route Frequency Provider Last Rate Last Admin   amLODipine (NORVASC) tablet 5 mg  5 mg Oral Once Lightfoot, Lucile Crater, MD        REVIEW OF SYSTEMS:   Constitutional: ( - )  fevers, ( - )  chills , ( - ) night sweats Eyes: ( - ) blurriness of vision, ( - ) double vision, ( - ) watery eyes Ears, nose, mouth, throat, and face: ( - ) mucositis, ( - ) sore throat Respiratory: ( - ) cough, ( - ) dyspnea, ( - ) wheezes Cardiovascular: ( - ) palpitation, ( - ) chest discomfort, ( - ) lower extremity swelling Gastrointestinal:  ( + ) nausea, ( - ) heartburn, ( - ) change in bowel habits Skin: ( - ) abnormal skin rashes Lymphatics: ( - ) new lymphadenopathy, ( - ) easy bruising Neurological: ( - ) numbness, ( - ) tingling, ( - ) new weaknesses Behavioral/Psych: ( - ) mood change, ( - ) new changes  All other systems were reviewed with the patient and are negative.  PHYSICAL EXAMINATION: ECOG PERFORMANCE STATUS: 1 - Symptomatic but completely ambulatory  Vitals:   10/04/22 1132  BP: 126/73  Pulse: 87  Resp: 16  Temp: 98.5 F (36.9 C)  SpO2: 100%      Filed Weights   10/04/22 1132  Weight: (!) 332 lb (150.6 kg)     GENERAL: Well-appearing elderly Caucasian male, alert, no distress and comfortable SKIN: skin color, texture, turgor are normal, no rashes or significant lesions EYES: conjunctiva are pink and non-injected, sclera clear LUNGS: clear to auscultation and percussion with normal breathing effort HEART: regular rate & rhythm and no murmurs and no lower extremity edema Musculoskeletal: no cyanosis of digits and no clubbing  PSYCH: alert & oriented x 3, fluent speech NEURO: no focal motor/sensory deficits  LABORATORY DATA:  I have reviewed the data as listed    Latest Ref Rng & Units 10/04/2022   11:09 AM 08/11/2022   12:32 AM 08/10/2022    9:48 AM  CBC  WBC 4.0 - 10.5 K/uL 4.9  4.9  4.8   Hemoglobin 13.0 - 17.0 g/dL 11.8  10.8  12.4   Hematocrit 39.0 - 52.0 % 36.4  33.4  38.0  Platelets 150 - 400 K/uL 226  225  223        Latest Ref Rng & Units 10/04/2022   11:09 AM 08/11/2022   12:32 AM 08/10/2022    9:48 AM  CMP  Glucose 70 - 99 mg/dL 110   248  115   BUN 8 - 23 mg/dL 29  17  18    Creatinine 0.61 - 1.24 mg/dL 0.95  0.81  0.93   Sodium 135 - 145 mmol/L 137  134  134   Potassium 3.5 - 5.1 mmol/L 4.4  4.2  4.3   Chloride 98 - 111 mmol/L 102  101  98   CO2 22 - 32 mmol/L 32  25  25   Calcium 8.9 - 10.3 mg/dL 9.7  9.4  9.5   Total Protein 6.5 - 8.1 g/dL 6.7   7.1   Total Bilirubin 0.3 - 1.2 mg/dL 0.4   0.4   Alkaline Phos 38 - 126 U/L 125   143   AST 15 - 41 U/L 21   53   ALT 0 - 44 U/L 24   58     RADIOGRAPHIC STUDIES: No results found.  ASSESSMENT & PLAN Keith Moreno 62 y.o. male with medical history significant for localized esophageal adenocarcinoma who presents for a follow up visit.   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 Stage IIB (cT2,cN0, cM0) --completed EUS and EBUS to finalize staging on 12/14/2021 --biopsy confirms moderate-poorly differentiated adenocarcinoma of the esophagus.  -- Received concurrent chemoradiation, started on 12/26/2021. End chemotherapy on 01/23/2022 (last dose on 01/30/2022 held due to neutropenia). Last radiation 02/02/2022. Plan:  --Labs today were reviewed and shows improvement. WBC 4.9, hemoglobin 1.8, MCV 86.3, and platelets of 226 --Patient is currently due for his surveillance CT scan.  His last scan was approximately 6 months ago.  Will order this today. --RTC in 3 months for a follow up with Dr. Lorenso Courier  #Hypotension--improved --Today's BP is 126/73  --Current BP medications include amlodipine 10 mg PO daily and Lisinopril 40 mg PO bedtime.  --Advised to stay hydrated.   #Supportive Care -- chemotherapy education completed -- port placed, continue to flush q 6 weeks.  -- zofran 8mg  q8H PRN and compazine 10mg  PO q6H for nausea -- EMLA cream for port -- no pain medication required at this time.   Orders Placed This Encounter  Procedures   CT CHEST ABDOMEN PELVIS W CONTRAST    Standing Status:   Future    Standing Expiration Date:    10/04/2023    Order Specific Question:   If indicated for the ordered procedure, I authorize the administration of contrast media per Radiology protocol    Answer:   Yes    Order Specific Question:   Does the patient have a contrast media/X-ray dye allergy?    Answer:   No    Order Specific Question:   Preferred imaging location?    Answer:   [facility]    Order Specific Question:   Is Oral Contrast requested for this exam?    Answer:   Yes, Per Radiology protocol    All questions were answered. The patient knows to call the clinic with any problems, questions or concerns.  I have spent a total of 30 minutes minutes of face-to-face and non-face-to-face time, preparing to see the patient, performing a medically appropriate examination, counseling and educating the patient, , communicating with other health care professionals, documenting clinical  information in the electronic health record, and care coordination.   Ledell Peoples, MD Department of Hematology/Oncology [place] at [facility] Phone: [phone] Pager: [phone] Email: [email or web address]  10/04/2022 3:35 PM

## 2022-10-03 NOTE — Telephone Encounter (Signed)
Please call [facility] discussed please call Dorian Pod) at [phone]

## 2022-10-03 NOTE — Telephone Encounter (Signed)
Sending this to you as you have been talking with Dorian Pod. I do not know what to do for her at this point.

## 2022-10-04 ENCOUNTER — Telehealth: Payer: Self-pay | Admitting: Orthopaedic Surgery

## 2022-10-04 ENCOUNTER — Inpatient Hospital Stay: Payer: No Typology Code available for payment source | Attending: Physician Assistant

## 2022-10-04 ENCOUNTER — Inpatient Hospital Stay (HOSPITAL_BASED_OUTPATIENT_CLINIC_OR_DEPARTMENT_OTHER): Payer: No Typology Code available for payment source | Admitting: Hematology and Oncology

## 2022-10-04 VITALS — BP 126/73 | HR 87 | Temp 98.5°F | Resp 16 | Ht 72.0 in | Wt 332.0 lb

## 2022-10-04 DIAGNOSIS — Z833 Family history of diabetes mellitus: Secondary | ICD-10-CM | POA: Diagnosis not present

## 2022-10-04 DIAGNOSIS — Z95828 Presence of other vascular implants and grafts: Secondary | ICD-10-CM | POA: Diagnosis not present

## 2022-10-04 DIAGNOSIS — Z800 Family history of malignant neoplasm of digestive organs: Secondary | ICD-10-CM | POA: Insufficient documentation

## 2022-10-04 DIAGNOSIS — Z8052 Family history of malignant neoplasm of bladder: Secondary | ICD-10-CM | POA: Diagnosis not present

## 2022-10-04 DIAGNOSIS — R590 Localized enlarged lymph nodes: Secondary | ICD-10-CM | POA: Insufficient documentation

## 2022-10-04 DIAGNOSIS — Z8616 Personal history of COVID-19: Secondary | ICD-10-CM | POA: Diagnosis not present

## 2022-10-04 DIAGNOSIS — Z818 Family history of other mental and behavioral disorders: Secondary | ICD-10-CM | POA: Insufficient documentation

## 2022-10-04 DIAGNOSIS — Z9049 Acquired absence of other specified parts of digestive tract: Secondary | ICD-10-CM | POA: Insufficient documentation

## 2022-10-04 DIAGNOSIS — C159 Malignant neoplasm of esophagus, unspecified: Secondary | ICD-10-CM | POA: Diagnosis not present

## 2022-10-04 DIAGNOSIS — I10 Essential (primary) hypertension: Secondary | ICD-10-CM | POA: Diagnosis not present

## 2022-10-04 DIAGNOSIS — Z8249 Family history of ischemic heart disease and other diseases of the circulatory system: Secondary | ICD-10-CM | POA: Insufficient documentation

## 2022-10-04 DIAGNOSIS — Z87442 Personal history of urinary calculi: Secondary | ICD-10-CM | POA: Insufficient documentation

## 2022-10-04 DIAGNOSIS — Z87891 Personal history of nicotine dependence: Secondary | ICD-10-CM | POA: Insufficient documentation

## 2022-10-04 DIAGNOSIS — C155 Malignant neoplasm of lower third of esophagus: Secondary | ICD-10-CM | POA: Insufficient documentation

## 2022-10-04 DIAGNOSIS — Z8051 Family history of malignant neoplasm of kidney: Secondary | ICD-10-CM | POA: Insufficient documentation

## 2022-10-04 DIAGNOSIS — R059 Cough, unspecified: Secondary | ICD-10-CM | POA: Diagnosis not present

## 2022-10-04 DIAGNOSIS — Z79899 Other long term (current) drug therapy: Secondary | ICD-10-CM | POA: Insufficient documentation

## 2022-10-04 LAB — CBC WITH DIFFERENTIAL (CANCER CENTER ONLY)
Abs Immature Granulocytes: 0.02 10*3/uL (ref 0.00–0.07)
Basophils Absolute: 0 10*3/uL (ref 0.0–0.1)
Basophils Relative: 1 %
Eosinophils Absolute: 0.1 10*3/uL (ref 0.0–0.5)
Eosinophils Relative: 2 %
HCT: 36.4 % — ABNORMAL LOW (ref 39.0–52.0)
Hemoglobin: 11.8 g/dL — ABNORMAL LOW (ref 13.0–17.0)
Immature Granulocytes: 0 %
Lymphocytes Relative: 7 %
Lymphs Abs: 0.4 10*3/uL — ABNORMAL LOW (ref 0.7–4.0)
MCH: 28 pg (ref 26.0–34.0)
MCHC: 32.4 g/dL (ref 30.0–36.0)
MCV: 86.3 fL (ref 80.0–100.0)
Monocytes Absolute: 0.5 10*3/uL (ref 0.1–1.0)
Monocytes Relative: 9 %
Neutro Abs: 4 10*3/uL (ref 1.7–7.7)
Neutrophils Relative %: 81 %
Platelet Count: 226 10*3/uL (ref 150–400)
RBC: 4.22 MIL/uL (ref 4.22–5.81)
RDW: 14.9 % (ref 11.5–15.5)
WBC Count: 4.9 10*3/uL (ref 4.0–10.5)
nRBC: 0 % (ref 0.0–0.2)

## 2022-10-04 LAB — CMP (CANCER CENTER ONLY)
ALT: 24 U/L (ref 0–44)
AST: 21 U/L (ref 15–41)
Albumin: 3.6 g/dL (ref 3.5–5.0)
Alkaline Phosphatase: 125 U/L (ref 38–126)
Anion gap: 3 — ABNORMAL LOW (ref 5–15)
BUN: 29 mg/dL — ABNORMAL HIGH (ref 8–23)
CO2: 32 mmol/L (ref 22–32)
Calcium: 9.7 mg/dL (ref 8.9–10.3)
Chloride: 102 mmol/L (ref 98–111)
Creatinine: 0.95 mg/dL (ref 0.61–1.24)
GFR, Estimated: >60 mL/min (ref >60)
Glucose, Bld: 110 mg/dL — ABNORMAL HIGH (ref 70–99)
Potassium: 4.4 mmol/L (ref 3.5–5.1)
Sodium: 137 mmol/L (ref 135–145)
Total Bilirubin: 0.4 mg/dL (ref 0.3–1.2)
Total Protein: 6.7 g/dL (ref 6.5–8.1)

## 2022-10-04 LAB — MAGNESIUM: Magnesium: 1.9 mg/dL (ref 1.7–2.4)

## 2022-10-04 MED ORDER — HEPARIN SOD (PORK) LOCK FLUSH 100 UNIT/ML IV SOLN
500.0000 [IU] | Freq: Once | INTRAVENOUS | Status: AC
  Administered 2022-10-04: 500 [IU]

## 2022-10-04 MED ORDER — SODIUM CHLORIDE 0.9% FLUSH
10.0000 mL | Freq: Once | INTRAVENOUS | Status: AC
  Administered 2022-10-04: 10 mL

## 2022-10-04 NOTE — Telephone Encounter (Signed)
noted 

## 2022-10-04 NOTE — Telephone Encounter (Signed)
Patient New order for [facility] ASAP.Marland Kitchenplease call

## 2022-10-04 NOTE — Telephone Encounter (Signed)
I do not understand what is happening with this patient?  They have been told that Dr. Lorin Mercy had released the patient.  Is there someone that I need to call and speak with?

## 2022-10-12 ENCOUNTER — Emergency Department (HOSPITAL_COMMUNITY)
Admission: EM | Admit: 2022-10-12 | Discharge: 2022-10-12 | Disposition: A | Discharge status: Home / Self Care | Payer: No Typology Code available for payment source | Attending: Emergency Medicine | Admitting: Emergency Medicine

## 2022-10-12 ENCOUNTER — Emergency Department (HOSPITAL_COMMUNITY): Payer: No Typology Code available for payment source

## 2022-10-12 ENCOUNTER — Encounter (HOSPITAL_COMMUNITY): Payer: Self-pay | Admitting: Emergency Medicine

## 2022-10-12 DIAGNOSIS — Z8501 Personal history of malignant neoplasm of esophagus: Secondary | ICD-10-CM | POA: Diagnosis not present

## 2022-10-12 DIAGNOSIS — E119 Type 2 diabetes mellitus without complications: Secondary | ICD-10-CM | POA: Diagnosis not present

## 2022-10-12 DIAGNOSIS — I10 Essential (primary) hypertension: Secondary | ICD-10-CM | POA: Insufficient documentation

## 2022-10-12 DIAGNOSIS — T85528A Displacement of other gastrointestinal prosthetic devices, implants and grafts, initial encounter: Secondary | ICD-10-CM | POA: Insufficient documentation

## 2022-10-12 DIAGNOSIS — Z79899 Other long term (current) drug therapy: Secondary | ICD-10-CM | POA: Diagnosis not present

## 2022-10-12 DIAGNOSIS — T85598A Other mechanical complication of other gastrointestinal prosthetic devices, implants and grafts, initial encounter: Secondary | ICD-10-CM

## 2022-10-12 DIAGNOSIS — Z7984 Long term (current) use of oral hypoglycemic drugs: Secondary | ICD-10-CM | POA: Diagnosis not present

## 2022-10-12 DIAGNOSIS — Y732 Prosthetic and other implants, materials and accessory gastroenterology and urology devices associated with adverse incidents: Secondary | ICD-10-CM | POA: Diagnosis not present

## 2022-10-12 HISTORY — PX: IR REPLC DUODEN/JEJUNO TUBE PERCUT W/FLUORO: IMG2334

## 2022-10-12 MED ORDER — LIDOCAINE VISCOUS HCL 2 % MT SOLN
OROMUCOSAL | Status: AC
  Filled 2022-10-12: qty 15

## 2022-10-12 MED ORDER — IOHEXOL 300 MG/ML  SOLN
50.0000 mL | Freq: Once | INTRAMUSCULAR | Status: AC | PRN
  Administered 2022-10-12: 10 mL

## 2022-10-12 NOTE — Procedures (Signed)
Interventional Radiology Procedure Note  Procedure: Replacement of jejunostomy tube under fluoroscopy  Complications: None  Estimated Blood Loss: None  Findings: 16 Fr balloon retention jejunostomy replacement over guidewire under fluoro. Well into jejunum after placement. OK to use immediately.  Venetia Night. Kathlene Cote, M.D Pager:  [phone]

## 2022-10-12 NOTE — ED Provider Notes (Addendum)
[facility] Provider Note   CSN: [id number] Arrival date & time: 10/12/22  [id number]     History  Chief Complaint  Patient presents with   Wound Check    Keith Moreno is a 62 y.o. male.  Patient with history of esophageal cancer, status post esophagectomy, feeding tube in place, hypertension, diabetes -- presents after he woke this morning and found his jejunal tube dislodged.  He awoke and felt something wet on his abdomen.  He did go to bed around 10 PM.  No abdominal pain.  Denies other complaints.  Tube was working normally prior.       Home Medications Prior to Admission medications   Medication Sig Start Date End Date Taking? Authorizing Provider  acetaminophen (TYLENOL) 500 MG tablet Take 2 tablets (1,000 mg total) by mouth every 6 (six) hours as needed for mild pain or fever. 08/11/22   Barrett, Lodema Hong, PA-C  Alogliptin Benzoate 12.5 MG TABS Take 12.5 mg by mouth daily.    [provider]  benzonatate (TESSALON) 100 MG capsule Take 100 mg by mouth 3 (three) times daily as needed for cough.    [provider]  carboxymethylcellulose (REFRESH PLUS) 0.5 % SOLN Place 1 drop into both eyes 2 (two) times daily as needed. 0.5 %    [provider]  guaiFENesin (ROBITUSSIN) 100 MG/5ML liquid Take 15 mLs by mouth every 4 (four) hours as needed for cough or to loosen phlegm. 08/11/22   Barrett, Lodema Hong, PA-C  HYDROcodone-acetaminophen (NORCO) 7.5-325 MG tablet Take 1 tablet by mouth every 6 (six) hours as needed for moderate pain. Patient not taking: Reported on 10/04/2022 08/11/22   Barrett, Lodema Hong, PA-C  lansoprazole (PREVACID) 30 MG capsule Take 1 capsule (30 mg total) by mouth 2 (two) times daily before a meal. 09/19/22 12/18/22  Lightfoot, Lucile Crater, MD  lisinopril (ZESTRIL) 40 MG tablet Take 40 mg by mouth daily.    [provider]  Nutritional Supplements (FEEDING SUPPLEMENT, OSMOLITE 1.5 CAL,) LIQD Place  1,710 mLs into feeding tube daily. Tube feedings to be run via J tube from 7 pm through 7 am nightly only 06/29/22   Lars Pinks M, PA-C  olopatadine (PATANOL) 0.1 % ophthalmic solution Place 1 drop into both eyes 2 (two) times daily. 10/28/21   [provider]  ondansetron (ZOFRAN-ODT) 8 MG disintegrating tablet Take 1 tablet (8 mg total) by mouth every 8 (eight) hours as needed for nausea or vomiting. 12/15/21   Dede Query T, PA-C  pioglitazone (ACTOS) 30 MG tablet Take 30 mg by mouth daily.    [provider]  Sodium Chloride Flush (NORMAL SALINE FLUSH) 0.9 % SOLN 30 mLs by Per J Tube route every 8 (eight) hours. 05/31/22   Barrett, Erin R, PA-C  Zinc Oxide (TRIPLE PASTE) 12.8 % ointment Apply topically 2 (two) times daily. To J-Tube site Patient taking differently: Apply 1 Application topically as needed for irritation. To J-Tube site 04/28/22   [name], PA-C      Allergies    Procaine, Chocolate, and Other    Review of Systems   Review of Systems  Physical Exam Updated Vital Signs BP 128/83   Pulse 88   Temp 98.4 F (36.9 C)   Resp (!) 6   SpO2 94%  Physical Exam Vitals and nursing note reviewed.  Constitutional:      General: He is not in acute distress.    Appearance:  He is well-developed.  HENT:     Head: Normocephalic and atraumatic.     Right Ear: External ear normal.     Left Ear: External ear normal.  Eyes:     General:        Right eye: No discharge.        Left eye: No discharge.     Conjunctiva/sclera: Conjunctivae normal.  Cardiovascular:     Rate and Rhythm: Normal rate and regular rhythm.     Heart sounds: Normal heart sounds.  Pulmonary:     Effort: Pulmonary effort is normal.     Breath sounds: Normal breath sounds.  Abdominal:     Palpations: Abdomen is soft.     Tenderness: There is no abdominal tenderness.     Comments: Opening for feeding tube R abd, no tenderness.  Musculoskeletal:     Cervical back: Normal range  of motion and neck supple.  Skin:    General: Skin is warm and dry.  Neurological:     Mental Status: He is alert.     ED Results / Procedures / Treatments   Labs (all labs ordered are listed, but only abnormal results are displayed) Labs Reviewed - No data to display  EKG None  Radiology No results found.  Procedures Procedures    Medications Ordered in ED Medications - No data to display  ED Course/ Medical Decision Making/ A&P Clinical Course as of 10/12/22 0547  Thu Oct 12, 2022  0546 Weight: 84.4 kg [JG]    Clinical Course User Index [JG] Carlisle Cater, PA-C    Patient seen and examined. History obtained directly from patient.   Labs/EKG: None ordered  Imaging: None ordered  Medications/Fluids: None ordered  Most recent vital signs reviewed and are as follows: BP 128/83   Pulse 88   Temp 98.4 F (36.9 C)   Resp (!) 6   SpO2 94%   Initial impression: Dislodged jejunal tube  Patient discussed with and seen by Dr. Ralene Bathe, she did speak with the radiologist and plan was to place a temporary Foley.  Patient will need to await IR consultation in the morning.  12 French uninflated Foley placed.  IR eval and management ordered.  6:39 AM Benedetto Goad PA-C aware of patient at shift change.                             Medical Decision Making  Patient here for dislodged jejunal tube.         Final Clinical Impression(s) / ED Diagnoses Final diagnoses:  Feeding tube dysfunction, initial encounter    Rx / DC Orders ED Discharge Orders     None         Suann Larry 10/12/22 0449    Quintella Reichert, MD 10/12/22 0602    Carlisle Cater, PA-C 10/12/22 [id number]    Quintella Reichert, MD 10/12/22 [phone]

## 2022-10-12 NOTE — ED Provider Notes (Signed)
Care assumed from PA Josh Geiple at shift change, please see his note for full details, but in brief Keith Moreno is a 62 y.o. male presents after G-tube became dislodged.  He uses this for tube feeds and medications after esophagectomy.  IR consult placed to replace J-tube, 12 French Foley catheter placed in tract for now.   IR able to replace J-tube, and reports it is ready for immediate use.  Patient's caregiver at bedside was able to easily flush G-tube with water.  No other complaints at this time.  Stable for discharge home.  Instructed to continue using J-tube as per usual.   Dartha Lodge, PA-C 10/12/22 1134    Long, Arlyss Repress, MD 10/13/22 [phone]

## 2022-10-12 NOTE — Discharge Instructions (Signed)
New J-tube has been placed.  Continue using it as directed.

## 2022-10-12 NOTE — ED Provider Notes (Signed)
Patient with jejunostomy tube for history of esophageal cancer status post esophagectomy.  His initial J-tube was placed at end of September with a 16 French tube.  IR not available at this time to replace his jejunostomy tube.  Discussed with patient recommendation to place a catheter to maintain his tract pending IR evaluation and he agrees with placement.  Pending IR availability a Foley catheter was placed to maintain the tract.  Initial attempt with a 70 [place] met resistance.  Second attempt with a 12 French catheter passed easily.  The balloon was not inflated.  The catheter was secured to the abdominal wall with adhesive tape.   Keith Reichert, MD 10/12/22 [phone]

## 2022-10-12 NOTE — ED Notes (Signed)
DC instructions reviewed with pt. PT verbalized understanding. PT DC °

## 2022-10-12 NOTE — Progress Notes (Signed)
Interventional Radiology Brief Note:  Patient inadvertently removed his J-tube while in bed. Presented to ED where a 12Fr Foley was replaced.  He did bring his tube with him.  It is a 16Fr direct J-tube which was placed by TCTS, [name] 05/09/22.    Site with some excoriation and erythema, lies within a skin fold.  Overall intact.    Patient and SO at bedside report ongoing need for enteral support and request replacement.   Consent obtained.   Brynda Greathouse, MS RD PA-C 9:53 AM

## 2022-10-12 NOTE — ED Triage Notes (Signed)
Pt rolled over in bed and G tube was out in the bed. Came out within the hour.

## 2022-10-13 ENCOUNTER — Telehealth: Payer: Self-pay | Admitting: Thoracic Surgery (Cardiothoracic Vascular Surgery)

## 2022-10-18 ENCOUNTER — Ambulatory Visit (HOSPITAL_COMMUNITY)
Admission: RE | Admit: 2022-10-18 | Discharge: 2022-10-18 | Disposition: A | Discharge status: Home / Self Care | Payer: No Typology Code available for payment source | Source: Ambulatory Visit | Attending: Hematology and Oncology | Admitting: Hematology and Oncology

## 2022-10-18 ENCOUNTER — Encounter (HOSPITAL_COMMUNITY): Payer: Self-pay

## 2022-10-18 ENCOUNTER — Encounter: Payer: Self-pay | Admitting: Hematology and Oncology

## 2022-10-18 DIAGNOSIS — C159 Malignant neoplasm of esophagus, unspecified: Secondary | ICD-10-CM | POA: Diagnosis present

## 2022-10-18 MED ORDER — IOHEXOL 300 MG/ML  SOLN
100.0000 mL | Freq: Once | INTRAMUSCULAR | Status: AC | PRN
  Administered 2022-10-18: 100 mL via INTRAVENOUS

## 2022-10-18 MED ORDER — SODIUM CHLORIDE (PF) 0.9 % IJ SOLN
INTRAMUSCULAR | Status: AC
  Filled 2022-10-18: qty 50

## 2022-10-20 ENCOUNTER — Telehealth: Payer: No Typology Code available for payment source | Admitting: Thoracic Surgery (Cardiothoracic Vascular Surgery)

## 2022-10-23 ENCOUNTER — Telehealth: Payer: Self-pay | Admitting: *Deleted

## 2022-10-23 ENCOUNTER — Encounter: Payer: Self-pay | Admitting: Hematology and Oncology

## 2022-10-23 NOTE — Telephone Encounter (Signed)
Received call back from pt. Advised  that his CT scan showed no evidence of residual/recurrent esophageal cancer. No other abnormalities were noted. Overall it was a good scan. We will plan to see him back in June 2024. Pt voiced understanding.

## 2022-10-23 NOTE — Telephone Encounter (Signed)
-----   Message from Jaci Standard, MD sent at 10/19/2022  2:08 PM EDT ----- Please let Mr. Stern know that his CT scan showed no evidence of residual/recurrent esophageal cancer. No other abnormalities were noted. Overall it was a good scan. We will plan to see him back in June 2024.   ----- Message ----- From: Leory Plowman, Rad Results In Sent: 10/19/2022  11:16 AM EDT To: Jaci Standard, MD

## 2022-10-23 NOTE — Telephone Encounter (Signed)
TCT patient regarding recent scan results.  No answer but was able to leave vm message for pt to return this call at his earliest convenience to review his CT scan results. Call back # is [phone]

## 2022-10-25 ENCOUNTER — Encounter: Payer: Self-pay | Admitting: Hematology and Oncology

## 2022-10-27 ENCOUNTER — Ambulatory Visit (INDEPENDENT_AMBULATORY_CARE_PROVIDER_SITE_OTHER): Payer: No Typology Code available for payment source | Admitting: Thoracic Surgery (Cardiothoracic Vascular Surgery)

## 2022-10-27 DIAGNOSIS — C159 Malignant neoplasm of esophagus, unspecified: Secondary | ICD-10-CM | POA: Diagnosis not present

## 2022-10-27 NOTE — Progress Notes (Signed)
     [address]       [place] [address]             [phone]       Patient: Home Provider: Office Consent for Telemedicine visit obtained.  Today's visit was completed via a real-time telehealth (see specific modality noted below). The patient/authorized person provided oral consent at the time of the visit to engage in a telemedicine encounter with the present provider at [facility]. The patient/authorized person was informed of the potential benefits, limitations, and risks of telemedicine. The patient/authorized person expressed understanding that the laws that protect confidentiality also apply to telemedicine. The patient/authorized person acknowledged understanding that telemedicine does not provide emergency services and that he or she would need to call 911 or proceed to the nearest hospital for help if such a need arose.   Total time spent in the clinical discussion 10 minutes.  Telehealth Modality: Phone visit (audio only)  I had a telephone visit with Mr. Hussar.  Doing well.  Has some dysphagia with liquids, but is tolerating regular food.  He continues to get some tubefeeds at night.  He is meeting with speech therapy next week.  I will see him in 2 weeks for tube removal.  Suriya Kovarik O Natalija Mavis

## 2022-11-01 ENCOUNTER — Encounter: Payer: Self-pay | Admitting: Speech Pathology

## 2022-11-01 ENCOUNTER — Ambulatory Visit: Payer: No Typology Code available for payment source | Attending: Internal Medicine | Admitting: Speech Pathology

## 2022-11-01 DIAGNOSIS — R1310 Dysphagia, unspecified: Secondary | ICD-10-CM | POA: Diagnosis present

## 2022-11-01 NOTE — Patient Instructions (Signed)
HARD SWALLOW  Tongue press up to roof of mouth 3 seconds, finish the swallow squeezing all the muscles   Goal: 50 reps 2x a day    50 hard swallows AM 50 hard swallows PM  Wednesday     Thursday    Friday    Saturday    Sunday    Monday    Tuesday     Wednesday

## 2022-11-01 NOTE — Therapy (Unsigned)
OUTPATIENT SPEECH LANGUAGE PATHOLOGY SWALLOW EVALUATION   Patient Name: Keith Moreno MRN: [id number] DOB:10-Dec-1960, 62 y.o., male Today's Date: 11/02/2022  PCP: [facility] [place] - Jess Barters, Judeth Cornfield, MD  REFERRING PROVIDER: Tanna Savoy RD  END OF SESSION:  End of Session - 11/02/22 0752     Visit Number 1    Number of Visits 13    Date for SLP Re-Evaluation 01/25/23    Authorization Type [facility]    SLP Start Time 1400    SLP Stop Time  1445    SLP Time Calculation (min) 45 min    Activity Tolerance Patient tolerated treatment well             Past Medical History:  Diagnosis Date   Anxiety    Cancer    Esophageal Cancer   COVID 2022   mild case   Dyspnea    r/t chemo and radiation   Headache    History of kidney stones    Hypertension    Kidney infection    at age 79   Pneumonia    62 years old   Pre-diabetes    PTSD (post-traumatic stress disorder)    per pt, if woken up suddenly he "cocks back arm" as if to punch but usually wakes up enough to come to before he hits anyone   Past Surgical History:  Procedure Laterality Date   BIOPSY  11/13/2021   Procedure: BIOPSY;  Surgeon: Beverley Fiedler, MD;  Location: [facility] ENDOSCOPY;  Service: Gastroenterology;;   BIOPSY  12/14/2021   Procedure: BIOPSY;  Surgeon: Lemar Lofty., MD;  Location: [facility] ENDOSCOPY;  Service: Gastroenterology;;   BRONCHIAL BRUSHINGS  12/14/2021   Procedure: BRONCHIAL BRUSHINGS;  Surgeon: Leslye Peer, MD;  Location: [facility] ENDOSCOPY;  Service: Cardiopulmonary;;   BRONCHIAL NEEDLE ASPIRATION BIOPSY  12/14/2021   Procedure: BRONCHIAL NEEDLE ASPIRATION BIOPSIES;  Surgeon: Leslye Peer, MD;  Location: [facility] ENDOSCOPY;  Service: Cardiopulmonary;;   COLONOSCOPY  2018   CYST EXCISION  2022   left side of head   ESOPHAGEAL STENT PLACEMENT N/A 06/27/2022   Procedure: ESOPHAGEAL STENT REMOVAL;  Surgeon: Corliss Skains, MD;  Location: [facility] OR;  Service: Thoracic;  Laterality: N/A;    ESOPHAGEAL STENT PLACEMENT N/A 06/28/2022   Procedure: ESOPHAGEAL STENT PLACEMENT;  Surgeon: Corliss Skains, MD;  Location: [facility] OR;  Service: Thoracic;  Laterality: N/A;   ESOPHAGEAL STENT PLACEMENT N/A 08/10/2022   Procedure: ESOPHAGEAL STENT REMOVAL;  Surgeon: Corliss Skains, MD;  Location: [facility] OR;  Service: Thoracic;  Laterality: N/A;   ESOPHAGECTOMY  04/05/2022   ESOPHAGOGASTRODUODENOSCOPY N/A 11/13/2021   Procedure: ESOPHAGOGASTRODUODENOSCOPY (EGD);  Surgeon: Beverley Fiedler, MD;  Location: [facility] ENDOSCOPY;  Service: Gastroenterology;  Laterality: N/A;   ESOPHAGOGASTRODUODENOSCOPY N/A 04/05/2022   Procedure: ESOPHAGOGASTRODUODENOSCOPY (EGD);  Surgeon: Corliss Skains, MD;  Location: [facility] OR;  Service: Thoracic;  Laterality: N/A;   ESOPHAGOGASTRODUODENOSCOPY N/A 05/09/2022   Procedure: ESOPHAGOGASTRODUODENOSCOPY (EGD);  Surgeon: Corliss Skains, MD;  Location: [facility] OR;  Service: Thoracic;  Laterality: N/A;  esophageal stent placement   ESOPHAGOGASTRODUODENOSCOPY N/A 06/27/2022   Procedure: ESOPHAGOGASTRODUODENOSCOPY (EGD);  Surgeon: Corliss Skains, MD;  Location: [facility] OR;  Service: Thoracic;  Laterality: N/A;   ESOPHAGOGASTRODUODENOSCOPY N/A 06/28/2022   Procedure: ESOPHAGOGASTRODUODENOSCOPY (EGD);  Surgeon: Corliss Skains, MD;  Location: [facility] OR;  Service: Thoracic;  Laterality: N/A;   ESOPHAGOGASTRODUODENOSCOPY N/A 08/10/2022   Procedure: ESOPHAGOGASTRODUODENOSCOPY (EGD);  Surgeon: Corliss Skains, MD;  Location: [facility] OR;  Service: Thoracic;  Laterality: N/A;   ESOPHAGOGASTRODUODENOSCOPY (EGD) WITH PROPOFOL N/A 12/14/2021   Procedure: ESOPHAGOGASTRODUODENOSCOPY (EGD) WITH PROPOFOL;  Surgeon: Meridee Score Netty Starring., MD;  Location: [facility] ENDOSCOPY;  Service: Gastroenterology;  Laterality: N/A;   EUS N/A 12/14/2021   Procedure: UPPER ENDOSCOPIC ULTRASOUND (EUS) LINEAR;  Surgeon: Lemar Lofty., MD;  Location: [facility] ENDOSCOPY;  Service: Gastroenterology;  Laterality: N/A;    FINE NEEDLE ASPIRATION  12/14/2021   Procedure: FINE NEEDLE ASPIRATION (FNA) LINEAR;  Surgeon: Lemar Lofty., MD;  Location: [facility] ENDOSCOPY;  Service: Gastroenterology;;   FOREIGN BODY REMOVAL  11/13/2021   Procedure: FOREIGN BODY REMOVAL;  Surgeon: Beverley Fiedler, MD;  Location: [facility] ENDOSCOPY;  Service: Gastroenterology;;   HUMERUS FRACTURE SURGERY Right    INTERCOSTAL NERVE BLOCK  04/05/2022   Procedure: INTERCOSTAL NERVE BLOCK;  Surgeon: Corliss Skains, MD;  Location: [facility] OR;  Service: Thoracic;;   IR IMAGING GUIDED PORT INSERTION  12/23/2021   IR REPLC DUODEN/JEJUNO TUBE PERCUT W/FLUORO  10/12/2022   IR THORACENTESIS ASP PLEURAL SPACE W/IMG GUIDE  04/19/2022   LAPAROSCOPIC JEJUNOSTOMY N/A 05/09/2022   Procedure: LAPAROSCOPIC JEJUNOSTOMY;  Surgeon: Corliss Skains, MD;  Location: [facility] OR;  Service: Thoracic;  Laterality: N/A;   MULTIPLE TOOTH EXTRACTIONS  2015   NODE DISSECTION  04/05/2022   Procedure: NODE DISSECTION;  Surgeon: Corliss Skains, MD;  Location: [facility] OR;  Service: Thoracic;;   TONSILLECTOMY AND ADENOIDECTOMY     as a child   VIDEO ASSISTED THORACOSCOPY Right 05/09/2022   Procedure: VIDEO ASSISTED THORACOSCOPY;  Surgeon: Corliss Skains, MD;  Location: [facility] OR;  Service: Thoracic;  Laterality: Right;   VIDEO BRONCHOSCOPY WITH ENDOBRONCHIAL ULTRASOUND Bilateral 12/14/2021   Procedure: VIDEO BRONCHOSCOPY WITH ENDOBRONCHIAL ULTRASOUND;  Surgeon: Leslye Peer, MD;  Location: [facility] ENDOSCOPY;  Service: Cardiopulmonary;  Laterality: Bilateral;   Patient Active Problem List   Diagnosis Date Noted   H/O esophagectomy 08/10/2022   Mediastinitis 05/30/2022   Pressure injury of skin 05/09/2022   Esophageal anastomotic leak 05/09/2022   Hypercalcemia 05/05/2022   History of esophagectomy 05/05/2022   Dysphagia 05/05/2022   Hypertension    Nocturnal hypoxia    Malnutrition of moderate degree 04/06/2022   Esophageal cancer 04/05/2022   Port-A-Cath in place  12/26/2021   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  Pt accompanied by: significant other  PERTINENT HISTORY: [facility] s/p esophageal tumor resection/esophagectomy  03/2022. He is currently supplementing his diet with tube. Reporting successful return to PO with solids but ongoing challenges with liquids.   PAIN:  Are you having pain? Yes: NPRS scale: 6/10 Pain location: ribs, abdomin, G tube site, back   FALLS: Has patient fallen in last 6 months?  Yes, Number of falls: 1  LIVING ENVIRONMENT: Lives with: lives with their family Lives in: House/apartment  PLOF:  Level of assistance: Independent with IADLs Employment: On disability  PATIENT GOALS: "drink water better"  OBJECTIVE:   DIAGNOSTIC FINDINGS: 10/23/2022 RD note, "Veteran reports a good appetite eating 4 regularly scheduled small meals  throughout  the day and continues with enteral nutrition nocturnal feeding. "I've  decreased the feeding volume from 49ml/hour to 21ml/hour with water rinse  every hour over 11 hours." No c/o n/v/d constipation at this time but admitted  that he has experienced nausea and constipation but medication provides relief.  No issues chewing but reports he continues to experience difficulty swallowing  liquids but is able to swallow solid foods fine. No mouth sores or taste changes  Reported"  Per Oncology PN 10/04/22: "12/26/2021: Started Carbo/Paclitaxel with radiation. End chemotherapy on  01/23/2022 (last dose on 01/30/2022 held due to neutropenia). Last radiation 02/02/2022."  RECOMMENDATIONS FROM OBJECTIVE SWALLOW STUDY (MBSS/FEES):  not yet completed Objective swallow impairments: n/a Objective recommended compensations: n/a  COGNITION: Overall cognitive status: Within functional limits for tasks assessed  ORAL MOTOR EXAMINATION: Overall status: WFL  CLINICAL SWALLOW ASSESSMENT:   Current diet: regular, thin liquids, and tube feeds  Dentition: missing dentition Patient directly observed with POs: Yes: thin liquids  Feeding: able to feed self Liquids provided by: cup Oral phase signs and symptoms:  no overt challenges Pharyngeal phase signs and symptoms: audible swallow, immediate cough, delayed cough, and watery eyes  PATIENT REPORTED OUTCOME MEASURES (PROM): EAT-10: 21 Question Patient's Response  My swallowing problem has caused me to lose weight 0  2.  My swallowing problem interferes with my ability to go out to meals 3  3.  Swallowing liquids takes extra effort 4  4.  Swallowing solids takes extra effort 1  5.  Swallowing pills takes extra effort 1  6.  Swallowing is painful 1  7.  The pleasure of eating is affected by my swallowing 3  8.  When I swallow food sticks in my throat 2  9.  I cough when I eat 3  10.  Swallowing is stressful  3  0= No problem 4= Severe problem  TODAY'S TREATMENT:                                                                                                                                         11/01/22: Initiated education on effortful swallow. Following education and demonstration, pt able to demonstrate x5 with mod-I.  PATIENT EDUCATION: Education details: eval results, recommendations, POC, goals Person educated: Patient and Spouse Education method: Explanation, Demonstration, and Handouts Education comprehension: verbalized understanding, returned demonstration, and needs further education   ASSESSMENT:  CLINICAL  IMPRESSION: Patient is a 62 y.o. M who was seen today for dysphagia eval s/p tx for esophageal cancer, including esophagectomy . Evaluation reveals moderate dysphagia. Pt's swallow is notable for overt s/sx of aspiration across all bolus trials of thin liquid. Pt reports sensation of air being "trapped" in esophagus following swallow, with subsequent need to burp it out which must be achieved by manual manipulation of laryngeal area. Pt is demonstrating small burps following each swallow, without need for manual manipulation. Pt's  spouse reporting pt is coughing across various consistencies, pt with reduced awareness. SLP questions if esophageal dysphagia is impacting pharyngeal sensation/function. Recommend instrumental swallow study (MBSS) to further investigate and formulate targeted POC. Initiated effortful swallow as HEP in meantime, as shown to be efficatious for variety of swallow impairments. Pt would benefit from skilled ST to address dysphagia to facilitate improved swallow function/safety .    OBJECTIVE IMPAIRMENTS: include dysphagia. These impairments are limiting patient from safety when swallowing. Factors affecting potential to achieve goals and functional outcome are co-morbidities. Patient will benefit from skilled SLP services to address above impairments and improve overall function.  REHAB POTENTIAL: Good   GOALS: Goals reviewed with patient? Yes  SHORT TERM GOALS: Target date: 11/29/2022  Pt will demonstrate swallow exercises and teach back recommended frequency with mod-I  Goal status: INITIAL  2.  Pt will use recommended compensations and strategies in 90% of PO trials over 2 sessions with occasional min-A Goal status: INITIAL  3.  Pt will participate in instrumental swallow study Goal status: INITIAL   LONG TERM GOALS: Target date: 12/27/2022  Pt will report daily completion of HEP over 1 week period Goal status: INITIAL  2.  Pt will report improvement via EAT-10  by d/c Goal status: INITIAL  3.  Pt will report subjective improvement in ability to drink liquids, with ability to meet hydration needs, by dc Goal status: INITIAL  PLAN:  SLP FREQUENCY: 1x/week  SLP DURATION: 8 weeks  PLANNED INTERVENTIONS: Aspiration precaution training, Pharyngeal strengthening exercises, Diet toleration management , Trials of upgraded texture/liquids, Cueing hierachy, SLP instruction and feedback, Compensatory strategies, Patient/family education, and Re-evaluation   SRINIVAS LIPPMAN, CCC-SLP 11/02/2022, 8:56 AM

## 2022-11-02 ENCOUNTER — Other Ambulatory Visit (HOSPITAL_COMMUNITY): Payer: Self-pay

## 2022-11-02 DIAGNOSIS — R1310 Dysphagia, unspecified: Secondary | ICD-10-CM

## 2022-11-08 ENCOUNTER — Encounter: Payer: Self-pay | Admitting: Hematology and Oncology

## 2022-11-10 ENCOUNTER — Ambulatory Visit (INDEPENDENT_AMBULATORY_CARE_PROVIDER_SITE_OTHER): Payer: No Typology Code available for payment source | Admitting: Thoracic Surgery (Cardiothoracic Vascular Surgery)

## 2022-11-10 VITALS — BP 123/81 | HR 93 | Resp 20 | Ht 72.0 in | Wt 330.0 lb

## 2022-11-10 DIAGNOSIS — Z9049 Acquired absence of other specified parts of digestive tract: Secondary | ICD-10-CM | POA: Diagnosis not present

## 2022-11-10 DIAGNOSIS — Z9889 Other specified postprocedural states: Secondary | ICD-10-CM | POA: Diagnosis not present

## 2022-11-10 NOTE — Progress Notes (Signed)
     [address]       [place] [address]             [phone]       Keith Moreno is still not ready to remove the J-tube due to concern for hydration.  The site looks stable  He will call us back after his speak therapy.    [name]

## 2022-11-14 ENCOUNTER — Ambulatory Visit (HOSPITAL_COMMUNITY)
Admission: RE | Admit: 2022-11-14 | Discharge: 2022-11-14 | Disposition: A | Discharge status: Home / Self Care | Payer: No Typology Code available for payment source | Source: Ambulatory Visit | Attending: Internal Medicine | Admitting: Internal Medicine

## 2022-11-14 ENCOUNTER — Ambulatory Visit (HOSPITAL_COMMUNITY)
Admission: RE | Admit: 2022-11-14 | Discharge: 2022-11-14 | Disposition: A | Discharge status: Home / Self Care | Payer: No Typology Code available for payment source | Source: Ambulatory Visit

## 2022-11-14 ENCOUNTER — Ambulatory Visit: Payer: No Typology Code available for payment source | Admitting: Speech Pathology

## 2022-11-14 DIAGNOSIS — R059 Cough, unspecified: Secondary | ICD-10-CM | POA: Diagnosis not present

## 2022-11-14 DIAGNOSIS — Z9049 Acquired absence of other specified parts of digestive tract: Secondary | ICD-10-CM | POA: Insufficient documentation

## 2022-11-14 DIAGNOSIS — Z8616 Personal history of COVID-19: Secondary | ICD-10-CM | POA: Diagnosis not present

## 2022-11-14 DIAGNOSIS — R1310 Dysphagia, unspecified: Secondary | ICD-10-CM | POA: Insufficient documentation

## 2022-11-14 DIAGNOSIS — Z923 Personal history of irradiation: Secondary | ICD-10-CM | POA: Diagnosis not present

## 2022-11-15 ENCOUNTER — Inpatient Hospital Stay: Payer: No Typology Code available for payment source | Attending: Physician Assistant

## 2022-11-15 DIAGNOSIS — C155 Malignant neoplasm of lower third of esophagus: Secondary | ICD-10-CM | POA: Insufficient documentation

## 2022-11-15 DIAGNOSIS — Z95828 Presence of other vascular implants and grafts: Secondary | ICD-10-CM

## 2022-11-15 MED ORDER — HEPARIN SOD (PORK) LOCK FLUSH 100 UNIT/ML IV SOLN
500.0000 [IU] | Freq: Once | INTRAVENOUS | Status: AC
  Administered 2022-11-15: 500 [IU]

## 2022-11-15 MED ORDER — SODIUM CHLORIDE 0.9% FLUSH
10.0000 mL | Freq: Once | INTRAVENOUS | Status: AC
  Administered 2022-11-15: 10 mL

## 2022-11-22 ENCOUNTER — Ambulatory Visit: Payer: No Typology Code available for payment source | Attending: Internal Medicine | Admitting: Speech Pathology

## 2022-11-22 DIAGNOSIS — R1310 Dysphagia, unspecified: Secondary | ICD-10-CM | POA: Diagnosis present

## 2022-11-22 NOTE — Therapy (Signed)
OUTPATIENT SPEECH LANGUAGE PATHOLOGY TREATMENT   Patient Name: Keith Moreno MRN: [id number] DOB:1961/06/12, 62 y.o., male Today's Date: 11/22/2022  PCP: [facility] [place] - Jess Barters, Judeth Cornfield, MD  REFERRING PROVIDER: Tanna Savoy RD  END OF SESSION:  End of Session - 11/22/22 1308     Visit Number 2    Number of Visits 13    Date for SLP Re-Evaluation 01/25/23    Authorization Type [facility]    SLP Start Time 1308    SLP Stop Time  1338    SLP Time Calculation (min) 30 min    Activity Tolerance Patient tolerated treatment well              Past Medical History:  Diagnosis Date   Anxiety    Cancer (HCC)    Esophageal Cancer   COVID 2022   mild case   Dyspnea    r/t chemo and radiation   Headache    History of kidney stones    Hypertension    Kidney infection    at age 64   Pneumonia    62 years old   Pre-diabetes    PTSD (post-traumatic stress disorder)    per pt, if woken up suddenly he "cocks back arm" as if to punch but usually wakes up enough to come to before he hits anyone   Past Surgical History:  Procedure Laterality Date   BIOPSY  11/13/2021   Procedure: BIOPSY;  Surgeon: Beverley Fiedler, MD;  Location: [facility] ENDOSCOPY;  Service: Gastroenterology;;   BIOPSY  12/14/2021   Procedure: BIOPSY;  Surgeon: Lemar Lofty., MD;  Location: [facility] ENDOSCOPY;  Service: Gastroenterology;;   BRONCHIAL BRUSHINGS  12/14/2021   Procedure: BRONCHIAL BRUSHINGS;  Surgeon: Leslye Peer, MD;  Location: [facility] ENDOSCOPY;  Service: Cardiopulmonary;;   BRONCHIAL NEEDLE ASPIRATION BIOPSY  12/14/2021   Procedure: BRONCHIAL NEEDLE ASPIRATION BIOPSIES;  Surgeon: Leslye Peer, MD;  Location: [facility] ENDOSCOPY;  Service: Cardiopulmonary;;   COLONOSCOPY  2018   CYST EXCISION  2022   left side of head   ESOPHAGEAL STENT PLACEMENT N/A 06/27/2022   Procedure: ESOPHAGEAL STENT REMOVAL;  Surgeon: Corliss Skains, MD;  Location: [facility] OR;  Service: Thoracic;  Laterality: N/A;    ESOPHAGEAL STENT PLACEMENT N/A 06/28/2022   Procedure: ESOPHAGEAL STENT PLACEMENT;  Surgeon: Corliss Skains, MD;  Location: [facility] OR;  Service: Thoracic;  Laterality: N/A;   ESOPHAGEAL STENT PLACEMENT N/A 08/10/2022   Procedure: ESOPHAGEAL STENT REMOVAL;  Surgeon: Corliss Skains, MD;  Location: [facility] OR;  Service: Thoracic;  Laterality: N/A;   ESOPHAGECTOMY  04/05/2022   ESOPHAGOGASTRODUODENOSCOPY N/A 11/13/2021   Procedure: ESOPHAGOGASTRODUODENOSCOPY (EGD);  Surgeon: Beverley Fiedler, MD;  Location: [facility] ENDOSCOPY;  Service: Gastroenterology;  Laterality: N/A;   ESOPHAGOGASTRODUODENOSCOPY N/A 04/05/2022   Procedure: ESOPHAGOGASTRODUODENOSCOPY (EGD);  Surgeon: Corliss Skains, MD;  Location: [facility] OR;  Service: Thoracic;  Laterality: N/A;   ESOPHAGOGASTRODUODENOSCOPY N/A 05/09/2022   Procedure: ESOPHAGOGASTRODUODENOSCOPY (EGD);  Surgeon: Corliss Skains, MD;  Location: [facility] OR;  Service: Thoracic;  Laterality: N/A;  esophageal stent placement   ESOPHAGOGASTRODUODENOSCOPY N/A 06/27/2022   Procedure: ESOPHAGOGASTRODUODENOSCOPY (EGD);  Surgeon: Corliss Skains, MD;  Location: [facility] OR;  Service: Thoracic;  Laterality: N/A;   ESOPHAGOGASTRODUODENOSCOPY N/A 06/28/2022   Procedure: ESOPHAGOGASTRODUODENOSCOPY (EGD);  Surgeon: Corliss Skains, MD;  Location: [facility] OR;  Service: Thoracic;  Laterality: N/A;   ESOPHAGOGASTRODUODENOSCOPY N/A 08/10/2022   Procedure: ESOPHAGOGASTRODUODENOSCOPY (EGD);  Surgeon: Corliss Skains, MD;  Location: [facility] OR;  Service: Thoracic;  Laterality: N/A;   ESOPHAGOGASTRODUODENOSCOPY (EGD) WITH PROPOFOL N/A 12/14/2021   Procedure: ESOPHAGOGASTRODUODENOSCOPY (EGD) WITH PROPOFOL;  Surgeon: Meridee Score Netty Starring., MD;  Location: [facility] ENDOSCOPY;  Service: Gastroenterology;  Laterality: N/A;   EUS N/A 12/14/2021   Procedure: UPPER ENDOSCOPIC ULTRASOUND (EUS) LINEAR;  Surgeon: Lemar Lofty., MD;  Location: [facility] ENDOSCOPY;  Service: Gastroenterology;  Laterality: N/A;    FINE NEEDLE ASPIRATION  12/14/2021   Procedure: FINE NEEDLE ASPIRATION (FNA) LINEAR;  Surgeon: Lemar Lofty., MD;  Location: [facility] ENDOSCOPY;  Service: Gastroenterology;;   FOREIGN BODY REMOVAL  11/13/2021   Procedure: FOREIGN BODY REMOVAL;  Surgeon: Beverley Fiedler, MD;  Location: [facility] ENDOSCOPY;  Service: Gastroenterology;;   HUMERUS FRACTURE SURGERY Right    INTERCOSTAL NERVE BLOCK  04/05/2022   Procedure: INTERCOSTAL NERVE BLOCK;  Surgeon: Corliss Skains, MD;  Location: [facility] OR;  Service: Thoracic;;   IR IMAGING GUIDED PORT INSERTION  12/23/2021   IR REPLC DUODEN/JEJUNO TUBE PERCUT W/FLUORO  10/12/2022   IR THORACENTESIS ASP PLEURAL SPACE W/IMG GUIDE  04/19/2022   LAPAROSCOPIC JEJUNOSTOMY N/A 05/09/2022   Procedure: LAPAROSCOPIC JEJUNOSTOMY;  Surgeon: Corliss Skains, MD;  Location: [facility] OR;  Service: Thoracic;  Laterality: N/A;   MULTIPLE TOOTH EXTRACTIONS  2015   NODE DISSECTION  04/05/2022   Procedure: NODE DISSECTION;  Surgeon: Corliss Skains, MD;  Location: [facility] OR;  Service: Thoracic;;   TONSILLECTOMY AND ADENOIDECTOMY     as a child   VIDEO ASSISTED THORACOSCOPY Right 05/09/2022   Procedure: VIDEO ASSISTED THORACOSCOPY;  Surgeon: Corliss Skains, MD;  Location: [facility] OR;  Service: Thoracic;  Laterality: Right;   VIDEO BRONCHOSCOPY WITH ENDOBRONCHIAL ULTRASOUND Bilateral 12/14/2021   Procedure: VIDEO BRONCHOSCOPY WITH ENDOBRONCHIAL ULTRASOUND;  Surgeon: Leslye Peer, MD;  Location: [facility] ENDOSCOPY;  Service: Cardiopulmonary;  Laterality: Bilateral;   Patient Active Problem List   Diagnosis Date Noted   H/O esophagectomy 08/10/2022   Mediastinitis 05/30/2022   Pressure injury of skin 05/09/2022   Esophageal anastomotic leak 05/09/2022   Hypercalcemia 05/05/2022   History of esophagectomy 05/05/2022   Dysphagia 05/05/2022   Hypertension    Nocturnal hypoxia    Malnutrition of moderate degree 04/06/2022   Esophageal cancer (HCC) 04/05/2022   Port-A-Cath in  place 12/26/2021   Esophageal adenocarcinoma (HCC) 12/12/2021   Mediastinal adenopathy 12/08/2021   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   PERTINENT HISTORY: [facility] s/p esophageal tumor resection/esophagectomy  03/2022. He is currently supplementing his diet with tube. Reporting successful return to PO with solids but ongoing challenges with liquids.   PAIN:  Are you having pain? Yes: NPRS scale: 6/10 Pain location: ribs, abdomin, G tube site, back   FALLS: Has patient fallen in last 6 months?  Yes, Number of falls: 1  LIVING ENVIRONMENT: Lives with: lives with their family Lives in: House/apartment  PLOF:  Level of assistance: Independent with IADLs Employment: On disability  PATIENT GOALS: "drink water better"  OBJECTIVE:   DIAGNOSTIC FINDINGS: 10/23/2022 RD note, "Veteran reports a good appetite eating 4  regularly scheduled small meals  throughout the day and continues with enteral nutrition nocturnal feeding. "I've  decreased the feeding volume from 43ml/hour to 29ml/hour with water rinse  every hour over 11 hours." No c/o n/v/d constipation at this time but admitted  that he has experienced nausea and constipation but medication provides relief.  No issues chewing but reports he continues to experience difficulty swallowing  liquids but is able to swallow solid foods fine. No mouth sores or taste changes  Reported"  Per Oncology PN 10/04/22: "12/26/2021: Started Carbo/Paclitaxel with radiation. End chemotherapy on 01/23/2022 (last dose on  01/30/2022 held due to neutropenia). Last radiation 02/02/2022."  RECOMMENDATIONS FROM OBJECTIVE SWALLOW STUDY (MBSS/FEES):  Oral and pharyngeal stage of swallowing WFL, speech therapy not recommended at this time.   Objective swallow impairments: n/a Objective recommended compensations: Avoid foods that increase acid reflex, and eat smaller meals throughout the day.   COGNITION: Overall cognitive status: Within functional limits for tasks assessed  ORAL MOTOR EXAMINATION: Overall status: WFL  CLINICAL SWALLOW ASSESSMENT:   Current diet: regular, thin liquids, and tube feeds  Dentition: missing dentition Patient directly observed with POs: Yes: thin liquids  Feeding: able to feed self Liquids provided by: cup Oral phase signs and symptoms:  no overt challenges Pharyngeal phase signs and symptoms: audible swallow, immediate cough, delayed cough, and watery eyes  PATIENT REPORTED OUTCOME MEASURES (PROM): EAT-10: 21 Question Patient's Response  My swallowing problem has caused me to lose weight 0  2.  My swallowing problem interferes with my ability to go out to meals 3  3.  Swallowing liquids takes extra effort 4  4.  Swallowing solids takes extra effort 1  5.  Swallowing pills takes extra effort 1  6.  Swallowing is painful 1  7.  The pleasure of eating is affected by my swallowing 3  8.  When I swallow food sticks in my throat 2  9.  I cough when I eat 3  10.  Swallowing is stressful  3  0= No problem 4= Severe problem  TODAY'S TREATMENT:                                                                                                                                         11/22/22: Reviewed swallow study. MBSS showed that there was no issue with the swallow within the oral or pharyngeal phase. Issue found may be due to recent surgery. Speech therapy was not recommended due to results from the MBSS. SLP went through the swallow study scans with pt to educate on normal swallowing  function. It was not seen that thin/thick liquids were preferred or needed. Hydration continues to be a concern. Recommended to avoid foods that increase acid reflex, as well as smaller meals throughout the day.   11/01/22: Initiated education on effortful swallow. Following education and demonstration, pt able to demonstrate x5 with  mod-I.  PATIENT EDUCATION: Education details: eval results, recommendations, POC, goals Person educated: Patient and Spouse Education method: Explanation, Demonstration, and Handouts Education comprehension: verbalized understanding, returned demonstration, and needs further education   ASSESSMENT:  CLINICAL IMPRESSION: Patient is a 62 y.o. M who was seen today for dysphagia eval s/p tx for esophageal cancer, including esophagectomy . Evaluation reveals moderate dysphagia. Pt. Completed modified barium study demonstrated within functional limits oral/pharyngeal swallow. Pt educated regarding results and recommendations. Skilled speech therapy is not indicated. Discussion regarding increasing tolerance of liquids with pt verbalizing understanding. Recommend retention of G-tube to increase quantity of liquid PO.   OBJECTIVE IMPAIRMENTS: include dysphagia. These impairments are limiting patient from safety when swallowing. Factors affecting potential to achieve goals and functional outcome are co-morbidities. Patient will benefit from skilled SLP services to address above impairments and improve overall function.  REHAB POTENTIAL: Good   GOALS: Goals reviewed with patient? Yes  SHORT TERM GOALS: Target date: 11/29/2022  Pt will demonstrate swallow exercises and teach back recommended frequency with mod-I  Goal status: MET  2.  Pt will use recommended compensations and strategies in 90% of PO trials over 2 sessions with occasional min-A Goal status: NOT MET  3.  Pt will participate in instrumental swallow study Goal status: MET   LONG TERM GOALS: Target  date: 12/27/2022  Pt will report daily completion of HEP over 1 week period Goal status: NOT MET  2.  Pt will report improvement via EAT-10 by d/c Goal status: NOT MET  3.  Pt will report subjective improvement in ability to drink liquids, with ability to meet hydration needs, by dc Goal status: NOT MET  PLAN:  SLP FREQUENCY: 1x/week  SLP DURATION: 8 weeks  PLANNED INTERVENTIONS: Aspiration precaution training, Pharyngeal strengthening exercises, Diet toleration management , Trials of upgraded texture/liquids, Cueing hierachy, SLP instruction and feedback, Compensatory strategies, Patient/family education, and Re-evaluation  SPEECH THERAPY DISCHARGE SUMMARY  Visits from Start of Care: 2  Current functional level related to goals / functional outcomes: Oral and pharyngeal phase of swallow WFL.    Remaining deficits: Dysphagia   Education / Equipment: Educated to avoid foods that increase acid reflex, and to take smaller bites of food. Encouraged to reach back out if things change.    Patient agrees to discharge. Patient goals were partially met. Patient is being discharged due to  results from [facility], speech therapy services are not recommended at this time.    [place], Student-SLP 11/22/2022, 1:08 PM

## 2022-11-29 ENCOUNTER — Ambulatory Visit: Payer: No Typology Code available for payment source | Admitting: Speech Pathology

## 2022-12-06 ENCOUNTER — Ambulatory Visit: Payer: No Typology Code available for payment source | Admitting: Speech Pathology

## 2022-12-13 ENCOUNTER — Ambulatory Visit: Payer: No Typology Code available for payment source | Admitting: Speech Pathology

## 2022-12-20 ENCOUNTER — Encounter: Payer: No Typology Code available for payment source | Admitting: Speech Pathology

## 2023-01-03 ENCOUNTER — Inpatient Hospital Stay (HOSPITAL_BASED_OUTPATIENT_CLINIC_OR_DEPARTMENT_OTHER): Payer: No Typology Code available for payment source | Admitting: Hematology and Oncology

## 2023-01-03 ENCOUNTER — Inpatient Hospital Stay: Payer: No Typology Code available for payment source | Attending: Physician Assistant

## 2023-01-03 ENCOUNTER — Other Ambulatory Visit: Payer: Self-pay | Admitting: Hematology and Oncology

## 2023-01-03 ENCOUNTER — Other Ambulatory Visit: Payer: Self-pay

## 2023-01-03 VITALS — BP 119/70 | HR 93 | Temp 97.0°F | Resp 17 | Wt 325.0 lb

## 2023-01-03 DIAGNOSIS — R059 Cough, unspecified: Secondary | ICD-10-CM | POA: Diagnosis not present

## 2023-01-03 DIAGNOSIS — Z800 Family history of malignant neoplasm of digestive organs: Secondary | ICD-10-CM | POA: Diagnosis not present

## 2023-01-03 DIAGNOSIS — R110 Nausea: Secondary | ICD-10-CM | POA: Diagnosis not present

## 2023-01-03 DIAGNOSIS — Z8249 Family history of ischemic heart disease and other diseases of the circulatory system: Secondary | ICD-10-CM | POA: Insufficient documentation

## 2023-01-03 DIAGNOSIS — Z818 Family history of other mental and behavioral disorders: Secondary | ICD-10-CM | POA: Diagnosis not present

## 2023-01-03 DIAGNOSIS — C159 Malignant neoplasm of esophagus, unspecified: Secondary | ICD-10-CM

## 2023-01-03 DIAGNOSIS — Z8052 Family history of malignant neoplasm of bladder: Secondary | ICD-10-CM | POA: Insufficient documentation

## 2023-01-03 DIAGNOSIS — Z79899 Other long term (current) drug therapy: Secondary | ICD-10-CM | POA: Insufficient documentation

## 2023-01-03 DIAGNOSIS — R5383 Other fatigue: Secondary | ICD-10-CM | POA: Diagnosis not present

## 2023-01-03 DIAGNOSIS — R590 Localized enlarged lymph nodes: Secondary | ICD-10-CM | POA: Diagnosis not present

## 2023-01-03 DIAGNOSIS — I959 Hypotension, unspecified: Secondary | ICD-10-CM | POA: Diagnosis not present

## 2023-01-03 DIAGNOSIS — C155 Malignant neoplasm of lower third of esophagus: Secondary | ICD-10-CM | POA: Insufficient documentation

## 2023-01-03 DIAGNOSIS — Z95828 Presence of other vascular implants and grafts: Secondary | ICD-10-CM

## 2023-01-03 DIAGNOSIS — Z87891 Personal history of nicotine dependence: Secondary | ICD-10-CM | POA: Insufficient documentation

## 2023-01-03 DIAGNOSIS — Z833 Family history of diabetes mellitus: Secondary | ICD-10-CM | POA: Diagnosis not present

## 2023-01-03 DIAGNOSIS — Z8051 Family history of malignant neoplasm of kidney: Secondary | ICD-10-CM | POA: Diagnosis not present

## 2023-01-03 DIAGNOSIS — Z8616 Personal history of COVID-19: Secondary | ICD-10-CM | POA: Diagnosis not present

## 2023-01-03 DIAGNOSIS — Z87442 Personal history of urinary calculi: Secondary | ICD-10-CM | POA: Insufficient documentation

## 2023-01-03 DIAGNOSIS — Z9089 Acquired absence of other organs: Secondary | ICD-10-CM | POA: Insufficient documentation

## 2023-01-03 DIAGNOSIS — Z9049 Acquired absence of other specified parts of digestive tract: Secondary | ICD-10-CM | POA: Diagnosis not present

## 2023-01-03 DIAGNOSIS — R1310 Dysphagia, unspecified: Secondary | ICD-10-CM | POA: Diagnosis not present

## 2023-01-03 LAB — CBC WITH DIFFERENTIAL (CANCER CENTER ONLY)
Abs Immature Granulocytes: 0.01 10*3/uL (ref 0.00–0.07)
Basophils Absolute: 0 10*3/uL (ref 0.0–0.1)
Basophils Relative: 1 %
Eosinophils Absolute: 0.1 10*3/uL (ref 0.0–0.5)
Eosinophils Relative: 2 %
HCT: 38.6 % — ABNORMAL LOW (ref 39.0–52.0)
Hemoglobin: 13 g/dL (ref 13.0–17.0)
Immature Granulocytes: 0 %
Lymphocytes Relative: 7 %
Lymphs Abs: 0.4 10*3/uL — ABNORMAL LOW (ref 0.7–4.0)
MCH: 29 pg (ref 26.0–34.0)
MCHC: 33.7 g/dL (ref 30.0–36.0)
MCV: 86.2 fL (ref 80.0–100.0)
Monocytes Absolute: 0.4 10*3/uL (ref 0.1–1.0)
Monocytes Relative: 7 %
Neutro Abs: 5 10*3/uL (ref 1.7–7.7)
Neutrophils Relative %: 83 %
Platelet Count: 199 10*3/uL (ref 150–400)
RBC: 4.48 MIL/uL (ref 4.22–5.81)
RDW: 14.5 % (ref 11.5–15.5)
WBC Count: 6 10*3/uL (ref 4.0–10.5)
nRBC: 0 % (ref 0.0–0.2)

## 2023-01-03 LAB — CMP (CANCER CENTER ONLY)
ALT: 34 U/L (ref 0–44)
AST: 26 U/L (ref 15–41)
Albumin: 3.7 g/dL (ref 3.5–5.0)
Alkaline Phosphatase: 127 U/L — ABNORMAL HIGH (ref 38–126)
Anion gap: 6 (ref 5–15)
BUN: 25 mg/dL — ABNORMAL HIGH (ref 8–23)
CO2: 27 mmol/L (ref 22–32)
Calcium: 9.7 mg/dL (ref 8.9–10.3)
Chloride: 105 mmol/L (ref 98–111)
Creatinine: 0.89 mg/dL (ref 0.61–1.24)
GFR, Estimated: >60 mL/min (ref >60)
Glucose, Bld: 101 mg/dL — ABNORMAL HIGH (ref 70–99)
Potassium: 4.1 mmol/L (ref 3.5–5.1)
Sodium: 138 mmol/L (ref 135–145)
Total Bilirubin: 0.7 mg/dL (ref 0.3–1.2)
Total Protein: 7 g/dL (ref 6.5–8.1)

## 2023-01-03 MED ORDER — SODIUM CHLORIDE 0.9% FLUSH
10.0000 mL | Freq: Once | INTRAVENOUS | Status: AC
  Administered 2023-01-03: 10 mL

## 2023-01-03 MED ORDER — HEPARIN SOD (PORK) LOCK FLUSH 100 UNIT/ML IV SOLN
500.0000 [IU] | Freq: Once | INTRAVENOUS | Status: AC
  Administered 2023-01-03: 500 [IU]

## 2023-01-03 NOTE — Progress Notes (Signed)
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 Stage IIB (cT2,cN0, cM0) 11/13/2021: Presented to the emergency department complaining of dysphagia and food impaction. Underwent EGD with Dr. Rhea Belton. Findings revealed medium sized fungating mass in the lower third of th esophagus, 35 cm from the incisors and extending into the GE junction.   It appears to have arisen in the background of Barrett's esophagus, the mass was nonobstructing and partially circumferential. Pathology confirmed moderately to poorly differentiated adenocarcinoma.  11/30/2021: PET scan showed  hypermetabolic activity in the distal esophagus with an SUV of 7.7 with thickening measuring up to 15 mm and spanning just to above the GE junction.  Diffuse mediastinal adenopathy showed dense calcification in all nearly symmetric findings.  12/06/2021: Established care with Radiation Oncology with Dr. Dorothy Puffer and Laurence Aly PA-C 12/12/2021: Establish care with Medication Oncology with Dr. Jeanie Sewer and Georga Kaufmann PA-C 12/26/2021: Started Carbo/Paclitaxel with radiation. End chemotherapy on 01/23/2022 (last dose on 01/30/2022 held due to neutropenia). Last radiation 02/02/2022.   Interval History:  Keith Moreno 62 y.o. male with medical history significant for localized esophageal adenocarcinoma who presents for a follow up visit. The patient's last visit was on 10/06/2022. In the interim since the last visit he has not been hospitalized.   On exam today Mr. Brocious is accompanied by his wife. He reports he has been struggling with  fatigue but otherwise has been stable.  He notes that he has not had any hospitalizations in the interim since her last visit.  He notes that he does have some dysphagia with liquids but is able to eat solid foods without difficulty.  He reports his diet is now regular.  He continues to keep his G-tube in place in order to keep up with hydration.  He notes he did have a swallow study which showed that "mechanically everything is working fine".  He notes he is now off the hydrocodone medication as he does not have any throat pain but does still have his usual aches and pains.  He does have some cough at night, particular when lying flat.  Overall his health is steady and he has no questions concerns or complaints today.  He denies fevers, chills, shortness of breath, chest pain or cough. He has no other complaints.  A full 10 point ROS was otherwise negative.  MEDICAL HISTORY:  Past Medical History:  Diagnosis Date   Anxiety    Cancer (HCC)    Esophageal Cancer   COVID 2022   mild case   Dyspnea    r/t chemo and radiation   Headache    History of kidney stones    Hypertension    Kidney infection    at age 55   Pneumonia    62 years old   Pre-diabetes    PTSD (post-traumatic stress disorder)    per pt, if woken up suddenly he "cocks back arm" as if to punch but usually wakes up enough to come to before he hits anyone    SURGICAL HISTORY: Past Surgical History:  Procedure Laterality Date   BIOPSY  11/13/2021   Procedure: BIOPSY;  Surgeon: Beverley Fiedler, MD;  Location: [facility] ENDOSCOPY;  Service: Gastroenterology;;   BIOPSY  12/14/2021   Procedure: BIOPSY;  Surgeon: Lemar Lofty., MD;  Location: [facility] ENDOSCOPY;  Service: Gastroenterology;;   BRONCHIAL BRUSHINGS  12/14/2021   Procedure: BRONCHIAL BRUSHINGS;  Surgeon: Leslye Peer, MD;  Location: [facility] ENDOSCOPY;  Service: Cardiopulmonary;;   BRONCHIAL NEEDLE ASPIRATION BIOPSY  12/14/2021   Procedure: BRONCHIAL NEEDLE ASPIRATION  BIOPSIES;  Surgeon: Leslye Peer, MD;  Location: [facility] ENDOSCOPY;  Service: Cardiopulmonary;;   COLONOSCOPY  2018   CYST EXCISION  2022   left side of head   ESOPHAGEAL STENT PLACEMENT N/A 06/27/2022   Procedure: ESOPHAGEAL STENT REMOVAL;  Surgeon: Corliss Skains, MD;  Location: [facility] OR;  Service: Thoracic;  Laterality: N/A;   ESOPHAGEAL STENT PLACEMENT N/A 06/28/2022   Procedure: ESOPHAGEAL STENT PLACEMENT;  Surgeon: Corliss Skains, MD;  Location: [facility] OR;  Service: Thoracic;  Laterality: N/A;   ESOPHAGEAL STENT PLACEMENT N/A 08/10/2022   Procedure: ESOPHAGEAL STENT REMOVAL;  Surgeon: Corliss Skains, MD;  Location: [facility] OR;  Service: Thoracic;  Laterality: N/A;   ESOPHAGECTOMY  04/05/2022   ESOPHAGOGASTRODUODENOSCOPY N/A 11/13/2021   Procedure: ESOPHAGOGASTRODUODENOSCOPY (EGD);  Surgeon: Beverley Fiedler, MD;  Location: [facility] ENDOSCOPY;  Service: Gastroenterology;  Laterality: N/A;   ESOPHAGOGASTRODUODENOSCOPY N/A 04/05/2022   Procedure: ESOPHAGOGASTRODUODENOSCOPY (EGD);  Surgeon: Corliss Skains, MD;  Location: [facility] OR;  Service: Thoracic;  Laterality: N/A;   ESOPHAGOGASTRODUODENOSCOPY N/A 05/09/2022   Procedure: ESOPHAGOGASTRODUODENOSCOPY (EGD);  Surgeon: Corliss Skains, MD;  Location: [facility] OR;  Service: Thoracic;  Laterality: N/A;  esophageal stent placement   ESOPHAGOGASTRODUODENOSCOPY N/A 06/27/2022   Procedure: ESOPHAGOGASTRODUODENOSCOPY (EGD);  Surgeon: Corliss Skains, MD;  Location: [facility] OR;  Service: Thoracic;  Laterality: N/A;   ESOPHAGOGASTRODUODENOSCOPY N/A 06/28/2022   Procedure: ESOPHAGOGASTRODUODENOSCOPY (EGD);  Surgeon: Corliss Skains, MD;  Location: [facility] OR;  Service: Thoracic;  Laterality: N/A;   ESOPHAGOGASTRODUODENOSCOPY N/A 08/10/2022   Procedure: ESOPHAGOGASTRODUODENOSCOPY (EGD);  Surgeon: Corliss Skains, MD;  Location: [facility] OR;  Service: Thoracic;  Laterality: N/A;   ESOPHAGOGASTRODUODENOSCOPY (EGD) WITH PROPOFOL N/A 12/14/2021   Procedure:  ESOPHAGOGASTRODUODENOSCOPY (EGD) WITH PROPOFOL;  Surgeon: Lemar Lofty., MD;  Location: [facility] ENDOSCOPY;  Service: Gastroenterology;  Laterality: N/A;   EUS N/A 12/14/2021   Procedure: UPPER ENDOSCOPIC ULTRASOUND (EUS) LINEAR;  Surgeon: Lemar Lofty., MD;  Location: [facility] ENDOSCOPY;  Service: Gastroenterology;  Laterality: N/A;   FINE NEEDLE ASPIRATION  12/14/2021   Procedure: FINE NEEDLE ASPIRATION (FNA) LINEAR;  Surgeon: Lemar Lofty., MD;  Location: [facility] ENDOSCOPY;  Service: Gastroenterology;;   FOREIGN BODY REMOVAL  11/13/2021   Procedure: FOREIGN BODY REMOVAL;  Surgeon: Beverley Fiedler, MD;  Location: [facility] ENDOS[facility];  Service: Gastroenterology;;   HUMERUS FRACTURE SURGERY Right    INTERCOSTAL NERVE BLOCK  04/05/2022   Procedure: INTERCOSTAL NERVE BLOCK;  Surgeon: Corliss Skains, MD;  Location: [facility] OR;  Service: Thoracic;;   IR IMAGING GUIDED PORT INSERTION  12/23/2021   IR REPLC DUODEN/JEJUNO TUBE PERCUT W/FLUORO  10/12/2022   IR THORACENTESIS ASP PLEURAL SPACE W/IMG GUIDE  04/19/2022   LAPAROSCOPIC JEJUNOSTOMY N/A 05/09/2022   Procedure: LAPAROSCOPIC JEJUNOSTOMY;  Surgeon: Corliss Skains, MD;  Location: [facility] OR;  Service: Thoracic;  Laterality: N/A;   MULTIPLE TOOTH EXTRACTIONS  2015   NODE DISSECTION  04/05/2022   Procedure: NODE DISSECTION;  Surgeon: Corliss Skains, MD;  Location: [facility] OR;  Service: Thoracic;;   TONSILLECTOMY AND ADENOIDECTOMY     as a child  VIDEO ASSISTED THORACOSCOPY Right 05/09/2022   Procedure: VIDEO ASSISTED THORACOSCOPY;  Surgeon: Corliss Skains, MD;  Location: [facility] OR;  Service: Thoracic;  Laterality: Right;   VIDEO BRONCHOSCOPY WITH ENDOBRONCHIAL ULTRASOUND Bilateral 12/14/2021   Procedure: VIDEO BRONCHOSCOPY WITH ENDOBRONCHIAL ULTRASOUND;  Surgeon: Leslye Peer, MD;  Location: [facility] ENDOSCOPY;  Service: Cardiopulmonary;  Laterality: Bilateral;    SOCIAL HISTORY: Social History   Socioeconomic History   Marital status:  Single    Spouse name: Not on file   Number of children: Not on file   Years of education: Not on file   Highest education level: Not on file  Occupational History   Not on file  Tobacco Use   Smoking status: Former    Types: Cigars, Cigarettes, Pipe    Quit date: 41    Years since quitting: 41.5   Smokeless tobacco: Former    Types: Chew    Quit date: 1983  Vaping Use   Vaping Use: Never used  Substance and Sexual Activity   Alcohol use: Not Currently    Comment: Rarely   Drug use: No   Sexual activity: Yes  Other Topics Concern   Not on file  Social History Narrative   Not on file   Social Determinants of Health   Financial Resource Strain: Not on file  Food Insecurity: No Food Insecurity (06/28/2022)   Hunger Vital Sign    Worried About Running Out of Food in the Last Year: Never true    Ran Out of Food in the Last Year: Never true  Transportation Needs: No Transportation Needs (06/28/2022)   PRAPARE - Administrator, Civil Service (Medical): No    Lack of Transportation (Non-Medical): No  Physical Activity: Not on file  Stress: Not on file  Social Connections: Not on file  Intimate Partner Violence: Not At Risk (06/28/2022)   Humiliation, Afraid, Rape, and Kick questionnaire    Fear of Current or Ex-Partner: No    Emotionally Abused: No    Physically Abused: No    Sexually Abused: No    FAMILY HISTORY: Family History  Problem Relation Age of Onset   Hypertension Mother    Diabetes Mother    Bipolar disorder Mother    Hypertension Father    Kidney cancer Sister    Colon cancer Paternal Uncle    Cancer Cousin    Bladder Cancer Cousin    Cancer Cousin     ALLERGIES:  is allergic to procaine, chocolate, and other.  MEDICATIONS:  Current Outpatient Medications  Medication Sig Dispense Refill   acetaminophen (TYLENOL) 500 MG tablet Take 2 tablets (1,000 mg total) by mouth every 6 (six) hours as needed for mild pain or fever. 30 tablet 0    Alogliptin Benzoate 12.5 MG TABS Take 12.5 mg by mouth daily.     benzonatate (TESSALON) 100 MG capsule Take 100 mg by mouth 3 (three) times daily as needed for cough.     carboxymethylcellulose (REFRESH PLUS) 0.5 % SOLN Place 1 drop into both eyes 2 (two) times daily as needed. 0.5 %     guaiFENesin (ROBITUSSIN) 100 MG/5ML liquid Take 15 mLs by mouth every 4 (four) hours as needed for cough or to loosen phlegm. 120 mL 0   HYDROcodone-acetaminophen (NORCO) 7.5-325 MG tablet Take 1 tablet by mouth every 6 (six) hours as needed for moderate pain. 30 tablet 0   lansoprazole (PREVACID) 30 MG capsule Take 1 capsule (30 mg total) by mouth 2 (  two) times daily before a meal. 180 capsule 0   lisinopril (ZESTRIL) 40 MG tablet Take 40 mg by mouth daily.     Nutritional Supplements (FEEDING SUPPLEMENT, OSMOLITE 1.5 CAL,) LIQD Place 1,710 mLs into feeding tube daily. Tube feedings to be run via J tube from 7 pm through 7 am nightly only 60000 mL 3   olopatadine (PATANOL) 0.1 % ophthalmic solution Place 1 drop into both eyes 2 (two) times daily.     ondansetron (ZOFRAN-ODT) 8 MG disintegrating tablet Take 1 tablet (8 mg total) by mouth every 8 (eight) hours as needed for nausea or vomiting. 90 tablet 3   pioglitazone (ACTOS) 30 MG tablet Take 30 mg by mouth daily.     Sodium Chloride Flush (NORMAL SALINE FLUSH) 0.9 % SOLN 30 mLs by Per J Tube route every 8 (eight) hours. 2700 mL 3   Zinc Oxide (TRIPLE PASTE) 12.8 % ointment Apply topically 2 (two) times daily. To J-Tube site (Patient taking differently: Apply 1 Application topically as needed for irritation. To J-Tube site) 56.7 g 0   Current Facility-Administered Medications  Medication Dose Route Frequency Provider Last Rate Last Admin   amLODipine (NORVASC) tablet 5 mg  5 mg Oral Once Lightfoot, Eliezer Lofts, MD        REVIEW OF SYSTEMS:   Constitutional: ( - ) fevers, ( - )  chills , ( - ) night sweats Eyes: ( - ) blurriness of vision, ( - ) double  vision, ( - ) watery eyes Ears, nose, mouth, throat, and face: ( - ) mucositis, ( - ) sore throat Respiratory: ( - ) cough, ( - ) dyspnea, ( - ) wheezes Cardiovascular: ( - ) palpitation, ( - ) chest discomfort, ( - ) lower extremity swelling Gastrointestinal:  ( + ) nausea, ( - ) heartburn, ( - ) change in bowel habits Skin: ( - ) abnormal skin rashes Lymphatics: ( - ) new lymphadenopathy, ( - ) easy bruising Neurological: ( - ) numbness, ( - ) tingling, ( - ) new weaknesses Behavioral/Psych: ( - ) mood change, ( - ) new changes  All other systems were reviewed with the patient and are negative.  PHYSICAL EXAMINATION: ECOG PERFORMANCE STATUS: 1 - Symptomatic but completely ambulatory  Vitals:   01/03/23 1416  BP: 119/70  Pulse: 93  Resp: 17  Temp: (!) 97 F (36.1 C)  SpO2: 96%   Filed Weights   01/03/23 1416  Weight: (!) 325 lb (147.4 kg)    GENERAL: Well-appearing elderly Caucasian male, alert, no distress and comfortable SKIN: skin color, texture, turgor are normal, no rashes or significant lesions EYES: conjunctiva are pink and non-injected, sclera clear LUNGS: clear to auscultation and percussion with normal breathing effort HEART: regular rate & rhythm and no murmurs and no lower extremity edema Musculoskeletal: no cyanosis of digits and no clubbing  PSYCH: alert & oriented x 3, fluent speech NEURO: no focal motor/sensory deficits  LABORATORY DATA:  I have reviewed the data as listed    Latest Ref Rng & Units 01/03/2023    1:53 PM 10/04/2022   11:09 AM 08/11/2022   12:32 AM  CBC  WBC 4.0 - 10.5 K/uL 6.0  4.9  4.9   Hemoglobin 13.0 - 17.0 g/dL 96.0  45.4  09.8   Hematocrit 39.0 - 52.0 % 38.6  36.4  33.4   Platelets 150 - 400 K/uL 199  226  225        Latest Ref  Rng & Units 01/03/2023    1:53 PM 10/04/2022   11:09 AM 08/11/2022   12:32 AM  CMP  Glucose 70 - 99 mg/dL 841  660  630   BUN 8 - 23 mg/dL 25  29  17    Creatinine 0.61 - 1.24 mg/dL 1.60  1.09  3.23    Sodium 135 - 145 mmol/L 138  137  134   Potassium 3.5 - 5.1 mmol/L 4.1  4.4  4.2   Chloride 98 - 111 mmol/L 105  102  101   CO2 22 - 32 mmol/L 27  32  25   Calcium 8.9 - 10.3 mg/dL 9.7  9.7  9.4   Total Protein 6.5 - 8.1 g/dL 7.0  6.7    Total Bilirubin 0.3 - 1.2 mg/dL 0.7  0.4    Alkaline Phos 38 - 126 U/L 127  125    AST 15 - 41 U/L 26  21    ALT 0 - 44 U/L 34  24      RADIOGRAPHIC STUDIES: No results found.  ASSESSMENT & PLAN Keith Moreno 62 y.o. male with medical history significant for localized esophageal adenocarcinoma who presents for a follow up visit.   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 Stage IIB (cT2,cN0, cM0) --completed EUS and EBUS to finalize staging on 12/14/2021 --biopsy confirms moderate-poorly differentiated adenocarcinoma of the esophagus.  -- Received concurrent chemoradiation, started on 12/26/2021. End chemotherapy on 01/23/2022 (last dose on 01/30/2022 held due to neutropenia). Last radiation 02/02/2022. Plan:  --Labs today were reviewed and shows improvement. WBC 4.9, hemoglobin 1.8, MCV 86.3, and platelets of 226 --Patient will be due for his surveillance CT scan in Oct 2024.  Last scan in April 2024 showed no evidence of recurrent disease.  --RTC in 4 months for a follow up with Dr. Leonides Schanz and CT scan.   #Hypotension--improved --Today's BP is 119/70 --Current BP medications include amlodipine 10 mg PO daily and Lisinopril 40 mg PO bedtime.  --Advised to stay hydrated.   #Supportive Care -- chemotherapy education completed -- port placed, continue to flush q 6 weeks.  -- zofran 8mg  q8H PRN and compazine 10mg  PO q6H for nausea -- EMLA cream for port -- no pain medication required at this time.   Orders Placed This Encounter  Procedures   CT CHEST ABDOMEN PELVIS W CONTRAST    Standing Status:   Future    Standing Expiration Date:   01/03/2024    Order Specific Question:   If indicated for the ordered  procedure, I authorize the administration of contrast media per Radiology protocol    Answer:   Yes    Order Specific Question:   Does the patient have a contrast media/X-ray dye allergy?    Answer:   No    Order Specific Question:   Preferred imaging location?    Answer:   [facility]    Order Specific Question:   If indicated for the ordered procedure, I authorize the administration of oral contrast media per Radiology protocol    Answer:   Yes    All questions were answered. The patient knows to call the clinic with any problems, questions or concerns.  I have spent a total of 30 minutes minutes of face-to-face and non-face-to-face time, preparing to see the patient, performing a medically appropriate examination, counseling and educating the patient, , communicating with other health care professionals, documenting clinical information in the electronic health record, and  care coordination.   Ulysees Barns, MD Department of Hematology/Oncology [facility] Phone: [phone] Pager: [phone] Email: [email or web address]  01/03/2023 5:21 PM

## 2023-02-14 ENCOUNTER — Other Ambulatory Visit: Payer: Self-pay

## 2023-02-14 ENCOUNTER — Inpatient Hospital Stay: Payer: No Typology Code available for payment source | Attending: Physician Assistant

## 2023-02-14 DIAGNOSIS — Z452 Encounter for adjustment and management of vascular access device: Secondary | ICD-10-CM | POA: Insufficient documentation

## 2023-02-14 DIAGNOSIS — C155 Malignant neoplasm of lower third of esophagus: Secondary | ICD-10-CM | POA: Diagnosis present

## 2023-02-14 DIAGNOSIS — Z95828 Presence of other vascular implants and grafts: Secondary | ICD-10-CM

## 2023-02-14 MED ORDER — HEPARIN SOD (PORK) LOCK FLUSH 100 UNIT/ML IV SOLN
500.0000 [IU] | Freq: Once | INTRAVENOUS | Status: AC
  Administered 2023-02-14: 500 [IU]

## 2023-02-14 MED ORDER — SODIUM CHLORIDE 0.9% FLUSH
10.0000 mL | Freq: Once | INTRAVENOUS | Status: AC
  Administered 2023-02-14: 10 mL

## 2023-03-06 IMAGING — PT NM PET TUM IMG INITIAL (PI) SKULL BASE T - THIGH
1 series · 10 of 10 positions shown · non-contrast
Comparison: Imaging from January 08, 2015.

CLINICAL DATA: Initial treatment strategy for esophageal
adenocarcinoma in a 61-year-old male.

EXAM:
NUCLEAR MEDICINE PET SKULL BASE TO THIGH
TECHNIQUE: 15.5 mCi F-18 FDG was injected intravenously. Full-ring PET imaging
was performed from the skull base to thigh after the radiotracer. CT
data was obtained and used for attenuation correction and anatomic
localization.
Fasting blood glucose: 111 mg/dl

[Series 1091: results mm oncology reading · 1.2mm · 0.40mm/px · 10 of 10 slices shown]
[im 1/10]
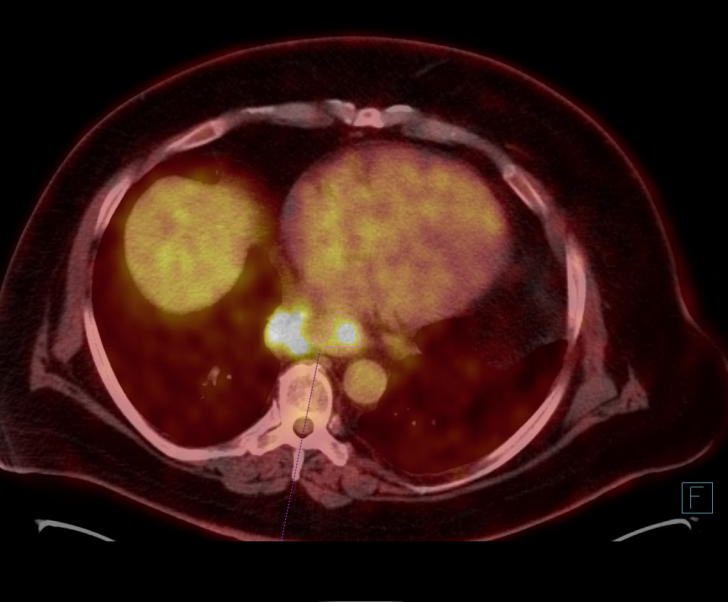
[im 2/10]
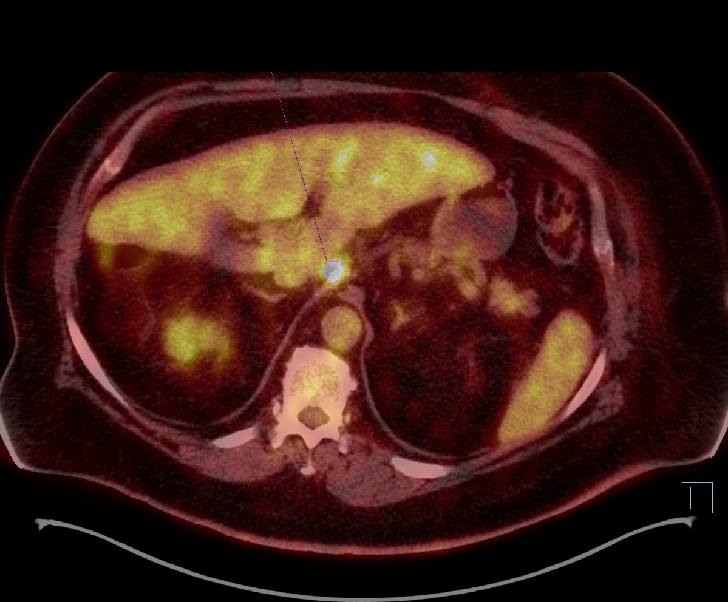
[im 3/10]
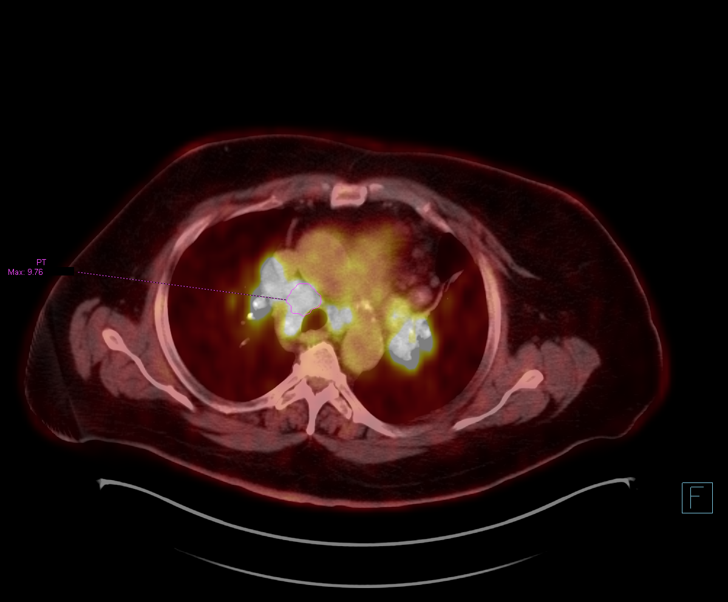
[im 4/10]
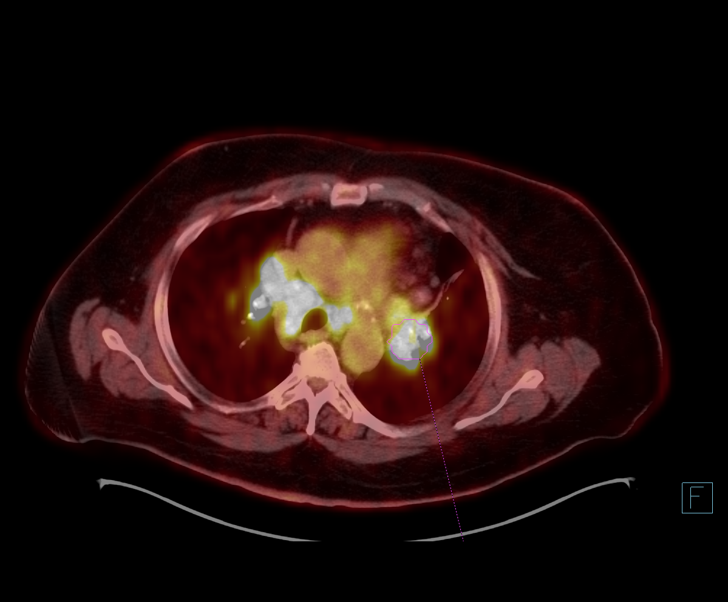
[im 5/10]
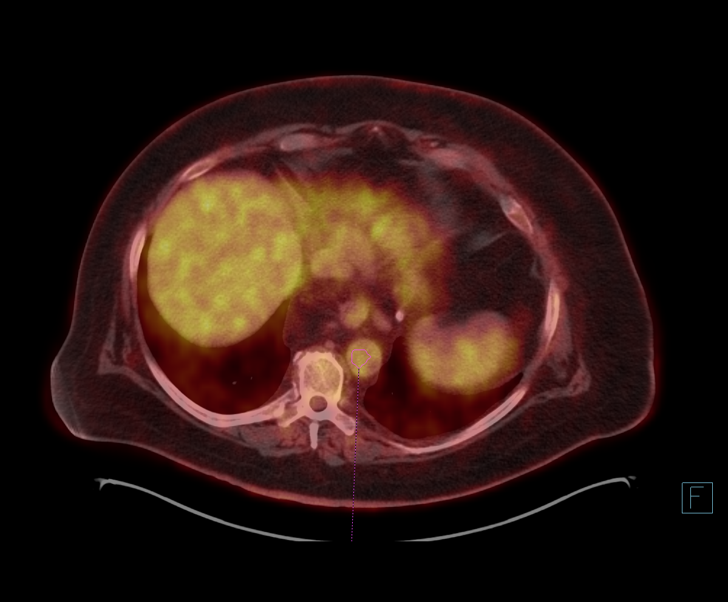
[im 6/10]
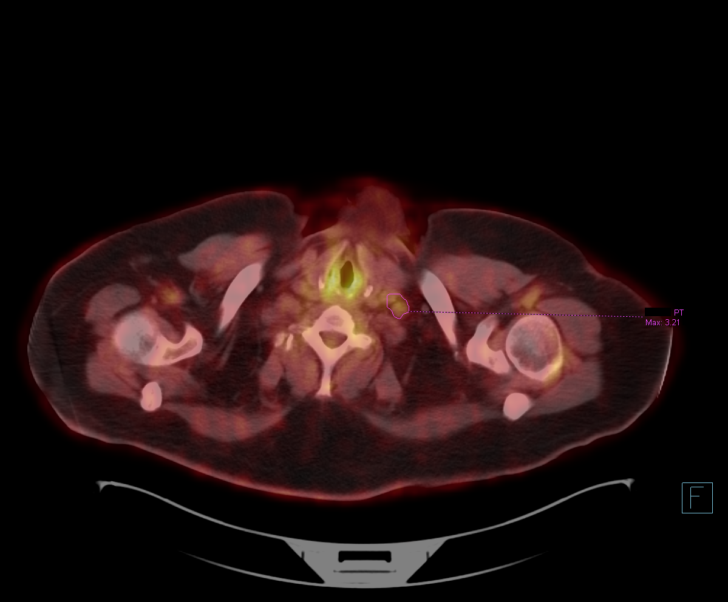
[im 7/10]
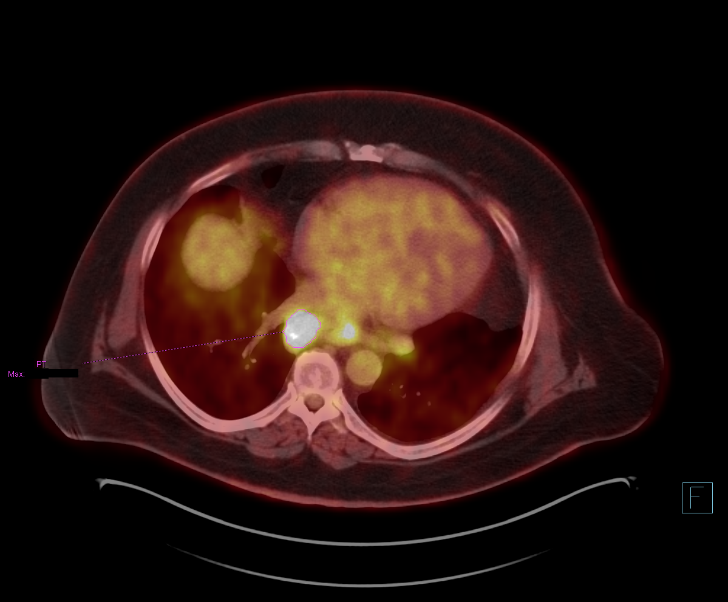
[im 8/10]
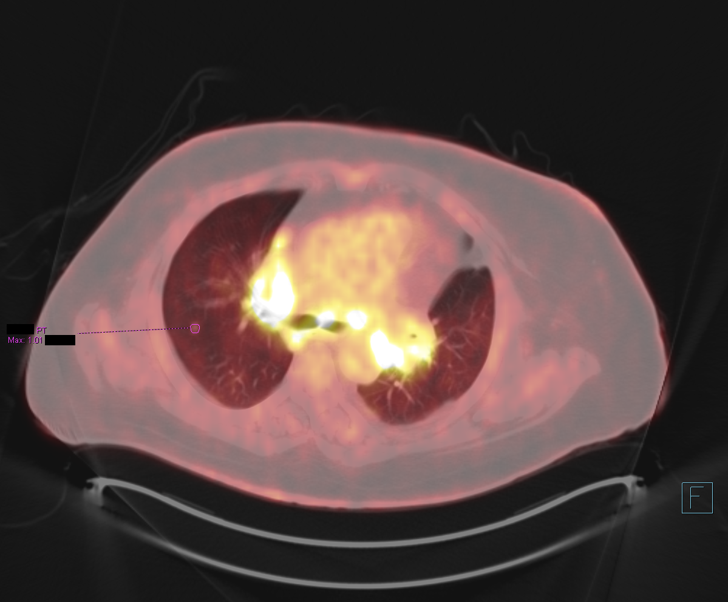
[im 9/10]
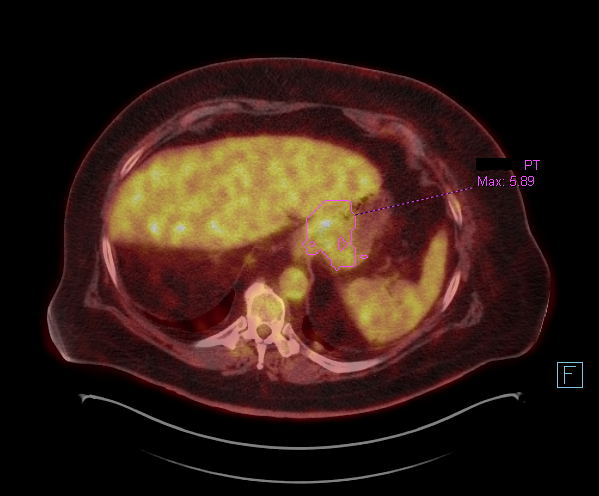
[im 10/10]
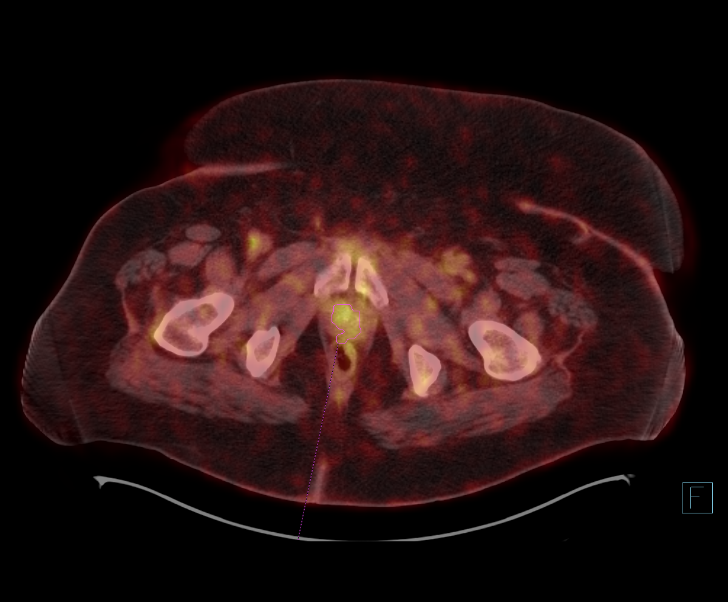

[10 of 10 positions shown; findings below may reference images not displayed]

FINDINGS: Mediastinal blood pool activity: SUV max

Liver activity: SUV max NA

NECK: Scattered small lymph nodes in the LEFT neck at level III and
IV (image 43/4) maximum SUV 3.3 measuring approximately 7-8 mm. No
additional areas in the neck of concern.

Incidental CT findings: None

CHEST:

Distal esophageal uptake with a maximum SUV 7.7 (image 90/4) this is
in an area of esophageal thickening at the distal esophagus
measuring up to 15 mm with some eccentric thickening in this
location. Metabolic activity spans a segment of the esophagus just
above the GE junction.

Diffuse mediastinal adenopathy many of these lymph nodes show dense
calcification all nearly symmetric with respect to metabolic
activity and many present on imaged portions of the chest included
on the abdominal CT from January 08, 2015, for instance on image 67/4
maximum SUV of the LEFT hilum 8.22 and maximum SUV along RIGHT
paratracheal chain of 9.76 also paraesophageal lymph nodes on image
87/4 were present on previous CT imaging and are little changed in
terms of size showing intense metabolic activity with a maximum SUV
10.38. Lymph nodes track along the RIGHT greater the LEFT
mediastinum and largely spare the mediastinum anteriorly. Is

Small nodule in the knee RIGHT upper lobe (image 71/4) no
substantial increased metabolic activity but this measures only 7 mm
with a maximum SUV of

Incidental CT findings: Signs of mild aortic atherosclerosis and
coronary artery disease mild cardiomegaly.

ABDOMEN/PELVIS: 12 mm lymph node adjacent to the RIGHT diaphragmatic
crus shows a maximum SUV of 10.28 and shows no visible calcium and
was not enlarged on previous imaging. Generalized heterogeneous
uptake of FDG throughout the liver. No focal area with suspicious
features.

Small hiatal hernia with heterogeneous uptake in the stomach below
the diaphragm as well maximum SUV 5.89 (image 101/4)

Incidental CT findings: Liver without acute process. Gallbladder,
pancreas, spleen and adrenal glands are unremarkable.

Nephrolithiasis in the interpolar RIGHT kidney with a moderately
large 10 mm calculus. No sign of hydronephrosis or perinephric
stranding. Scattered small calculi in the LEFT kidney. Small LEFT
renal cyst which does not require dedicated imaging follow-up on
image 139 measuring approximately 1-1.2 cm. Urinary bladder is
collapsed.

Gastric and esophageal thickening as discussed. Signs of colonic
diverticulosis. Normal appendix. No acute small bowel process.

Heterogeneous uptake about the prostate showing a maximum SUV of
approximately 5.7. Moderate RIGHT fat containing inguinal hernia.

SKELETON: No focal hypermetabolic activity to suggest skeletal
metastasis.

Incidental CT findings: none
IMPRESSION: Signs of esophageal uptake and thickening in this patient with known
esophageal neoplasm likely represents the site of disease.

Heterogeneity and potential thickening near the gastric fundus in
the setting of small hiatal hernia is nonspecific and potentially
physiologic. However, in light of distal esophageal neoplasm would
correlate with the recent endoscopic results to ensure no gastric
abnormality.

Diffuse mediastinal adenopathy, areas imaged on prior studies are
similar and many areas with calcification, pattern of adenopathy and
uptake would favor process such as sarcoidosis. Unfortunately this
confounds assessment for metastatic disease. The most suspicious
node currently is a juxta crural lymph node on the RIGHT adjacent to
the celiac axis though again this could be related to underlying
sarcoidosis.

No definitive signs of metastatic disease with above limitations as
outlined.

Nephrolithiasis.

Colonic diverticulosis.

Heterogeneous uptake in the prostate is nonspecific. Consider
urologic follow-up with PSA as warranted.

## 2023-03-21 ENCOUNTER — Inpatient Hospital Stay: Payer: No Typology Code available for payment source | Attending: Physician Assistant

## 2023-03-21 VITALS — BP 140/73 | HR 88 | Temp 97.9°F | Resp 18

## 2023-03-21 DIAGNOSIS — Z79899 Other long term (current) drug therapy: Secondary | ICD-10-CM | POA: Diagnosis not present

## 2023-03-21 DIAGNOSIS — C155 Malignant neoplasm of lower third of esophagus: Secondary | ICD-10-CM | POA: Insufficient documentation

## 2023-03-21 DIAGNOSIS — Z452 Encounter for adjustment and management of vascular access device: Secondary | ICD-10-CM | POA: Diagnosis present

## 2023-03-21 DIAGNOSIS — Z95828 Presence of other vascular implants and grafts: Secondary | ICD-10-CM

## 2023-03-21 MED ORDER — HEPARIN SOD (PORK) LOCK FLUSH 100 UNIT/ML IV SOLN
500.0000 [IU] | Freq: Once | INTRAVENOUS | Status: AC
  Administered 2023-03-21: 500 [IU]

## 2023-03-21 MED ORDER — SODIUM CHLORIDE 0.9% FLUSH
10.0000 mL | Freq: Once | INTRAVENOUS | Status: AC
  Administered 2023-03-21: 10 mL

## 2023-03-21 NOTE — Patient Instructions (Signed)

## 2023-04-02 ENCOUNTER — Ambulatory Visit (HOSPITAL_COMMUNITY)
Admission: RE | Admit: 2023-04-02 | Discharge: 2023-04-02 | Disposition: A | Discharge status: Home / Self Care | Payer: No Typology Code available for payment source | Source: Ambulatory Visit | Attending: Hematology and Oncology | Admitting: Hematology and Oncology

## 2023-04-02 ENCOUNTER — Encounter (HOSPITAL_COMMUNITY): Payer: Self-pay

## 2023-04-02 DIAGNOSIS — C159 Malignant neoplasm of esophagus, unspecified: Secondary | ICD-10-CM | POA: Insufficient documentation

## 2023-04-02 MED ORDER — IOHEXOL 300 MG/ML  SOLN
100.0000 mL | Freq: Once | INTRAMUSCULAR | Status: AC | PRN
  Administered 2023-04-02: 100 mL via INTRAVENOUS

## 2023-04-02 MED ORDER — SODIUM CHLORIDE (PF) 0.9 % IJ SOLN
INTRAMUSCULAR | Status: AC
  Filled 2023-04-02: qty 50

## 2023-04-03 ENCOUNTER — Telehealth: Payer: Self-pay | Admitting: *Deleted

## 2023-04-03 NOTE — Telephone Encounter (Signed)
-----   Message from Ulysees Barns IV sent at 04/03/2023  1:38 PM EDT ----- Please let Mr. Schuessler know that his CT scan showed no evidence of residual or recurrent cancer.  We will plan to see him back in early October as scheduled. ----- Message ----- From: Leory Plowman, Rad Results In Sent: 04/02/2023   5:37 PM EDT To: Jaci Standard, MD

## 2023-04-03 NOTE — Telephone Encounter (Signed)
Notified of message below

## 2023-04-17 ENCOUNTER — Other Ambulatory Visit: Payer: Self-pay | Admitting: Hematology and Oncology

## 2023-04-17 DIAGNOSIS — C159 Malignant neoplasm of esophagus, unspecified: Secondary | ICD-10-CM

## 2023-04-17 NOTE — Progress Notes (Unsigned)
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 Stage IIB (cT2,cN0, cM0) 11/13/2021: Presented to the emergency department complaining of dysphagia and food impaction. Underwent EGD with Dr. Rhea Belton. Findings revealed medium sized fungating mass in the lower third of th esophagus, 35 cm from the incisors and extending into the GE junction.   It appears to have arisen in the background of Barrett's esophagus, the mass was nonobstructing and partially circumferential. Pathology confirmed moderately to poorly differentiated adenocarcinoma.  11/30/2021: PET scan showed  hypermetabolic activity in the distal esophagus with an SUV of 7.7 with thickening measuring up to 15 mm and spanning just to above the GE junction.  Diffuse mediastinal adenopathy showed dense calcification in all nearly symmetric findings.  12/06/2021: Established care with Radiation Oncology with Dr. Dorothy Puffer and Laurence Aly PA-C 12/12/2021: Establish care with Medication Oncology with Dr. Jeanie Sewer and Georga Kaufmann PA-C 12/26/2021: Started Carbo/Paclitaxel with radiation. End chemotherapy on 01/23/2022 (last dose on 01/30/2022 held due to neutropenia). Last radiation 02/02/2022.   Interval History:  Keith Moreno 62 y.o. male with medical history significant for localized esophageal adenocarcinoma who presents for a follow up visit. The patient's last visit was on 01/03/2023. In the interim since the last visit he has not been hospitalized.   On exam today Keith Moreno is accompanied by his wife. He reports he has been well overall in the  [place] last visit 4 months ago.  He has had a few medication changes, most notably he started on antidepressant drug for mild depression/anxiety.  He reports he started taking October 1 and he does feel like he is benefiting from it.  His appetite has been "so-so" and he does still have some problem with swallowing liquids.  He reports that when he swallows enough liquid he feels like he has trapped air and has to belch, but belching is difficult.  He reports that he is not having any trouble with solids, even meat.  He reports that he has not had any recent follow-up with the surgical service.  He notes that otherwise he has been at his baseline level of health and has no questions, concerns, or complaints today.  He denies fevers, chills, shortness of breath, chest pain or cough. He has no other complaints.  A full 10 point ROS was otherwise negative.  MEDICAL HISTORY:  Past Medical History:  Diagnosis Date   Anxiety    Cancer (HCC)    Esophageal Cancer   COVID 2022   mild case   Dyspnea    r/t chemo and radiation   Headache    History of kidney stones    Hypertension    Kidney infection    at age 47   Pneumonia    62 years old   Pre-diabetes    PTSD (post-traumatic stress disorder)    per pt, if woken up suddenly he "cocks back arm" as if to punch but usually wakes up enough to come to before he hits anyone    SURGICAL HISTORY: Past Surgical History:  Procedure Laterality Date   BIOPSY  11/13/2021   Procedure: BIOPSY;  Surgeon: Beverley Fiedler, MD;  Location[facility]  ENDOSCOPY;  Service: Gastroenterology;;   BIOPSY  12/14/2021   Procedure: BIOPSY;  Surgeon: Lemar Lofty., MD;  Location: [facility] ENDOSCOPY;  Service: Gastroenterology;;   BRONCHIAL BRUSHINGS  12/14/2021   Procedure: BRONCHIAL BRUSHINGS;  Surgeon: Leslye Peer, MD;  Location: [facility] ENDOSCOPY;  Service: Cardiopulmonary;;   BRONCHIAL NEEDLE ASPIRATION BIOPSY  12/14/2021   Procedure: BRONCHIAL NEEDLE ASPIRATION BIOPSIES;   Surgeon: Leslye Peer, MD;  Location: [facility] ENDOSCOPY;  Service: Cardiopulmonary;;   COLONOSCOPY  2018   CYST EXCISION  2022   left side of head   ESOPHAGEAL STENT PLACEMENT N/A 06/27/2022   Procedure: ESOPHAGEAL STENT REMOVAL;  Surgeon: Corliss Skains, MD;  Location: [facility] OR;  Service: Thoracic;  Laterality: N/A;   ESOPHAGEAL STENT PLACEMENT N/A 06/28/2022   Procedure: ESOPHAGEAL STENT PLACEMENT;  Surgeon: Corliss Skains, MD;  Location: [facility] OR;  Service: Thoracic;  Laterality: N/A;   ESOPHAGEAL STENT PLACEMENT N/A 08/10/2022   Procedure: ESOPHAGEAL STENT REMOVAL;  Surgeon: Corliss Skains, MD;  Location: [facility] OR;  Service: Thoracic;  Laterality: N/A;   ESOPHAGECTOMY  04/05/2022   ESOPHAGOGASTRODUODENOSCOPY N/A 11/13/2021   Procedure: ESOPHAGOGASTRODUODENOSCOPY (EGD);  Surgeon: Beverley Fiedler, MD;  Location: [facility] ENDOSCOPY;  Service: Gastroenterology;  Laterality: N/A;   ESOPHAGOGASTRODUODENOSCOPY N/A 04/05/2022   Procedure: ESOPHAGOGASTRODUODENOSCOPY (EGD);  Surgeon: Corliss Skains, MD;  Location: [facility] OR;  Service: Thoracic;  Laterality: N/A;   ESOPHAGOGASTRODUODENOSCOPY N/A 05/09/2022   Procedure: ESOPHAGOGASTRODUODENOSCOPY (EGD);  Surgeon: Corliss Skains, MD;  Location: [facility] OR;  Service: Thoracic;  Laterality: N/A;  esophageal stent placement   ESOPHAGOGASTRODUODENOSCOPY N/A 06/27/2022   Procedure: ESOPHAGOGASTRODUODENOSCOPY (EGD);  Surgeon: Corliss Skains, MD;  Location: [facility] OR;  Service: Thoracic;  Laterality: N/A;   ESOPHAGOGASTRODUODENOSCOPY N/A 06/28/2022   Procedure: ESOPHAGOGASTRODUODENOSCOPY (EGD);  Surgeon: Corliss Skains, MD;  Location: [facility] OR;  Service: Thoracic;  Laterality: N/A;   ESOPHAGOGASTRODUODENOSCOPY N/A 08/10/2022   Procedure: ESOPHAGOGASTRODUODENOSCOPY (EGD);  Surgeon: Corliss Skains, MD;  Location: [facility] OR;  Service: Thoracic;  Laterality: N/A;   ESOPHAGOGASTRODUODENOSCOPY (EGD) WITH PROPOFOL N/A 12/14/2021   Procedure:  ESOPHAGOGASTRODUODENOSCOPY (EGD) WITH PROPOFOL;  Surgeon: Lemar Lofty., MD;  Location: [facility] ENDOSCOPY;  Service: Gastroenterology;  Laterality: N/A;   EUS N/A 12/14/2021   Procedure: UPPER ENDOSCOPIC ULTRASOUND (EUS) LINEAR;  Surgeon: Lemar Lofty., MD;  Location: [facility] ENDOSCOPY;  Service: Gastroenterology;  Laterality: N/A;   FINE NEEDLE ASPIRATION  12/14/2021   Procedure: FINE NEEDLE ASPIRATION (FNA) LINEAR;  Surgeon: Lemar Lofty., MD;  Location: [facility] ENDOSCOPY;  Service: Gastroenterology;;   FOREIGN BODY REMOVAL  11/13/2021   Procedure: FOREIGN BODY REMOVAL;  Surgeon: Beverley Fiedler, MD;  Location: [facility] ENDOSCOPY;  Service: Gastroenterology;;   HUMERUS FRACTURE SURGERY Right    INTERCOSTAL NERVE BLOCK  04/05/2022   Procedure: INTERCOSTAL NERVE BLOCK;  Surgeon: Corliss Skains, MD;  Location: [facility] OR;  Service: Thoracic;;   IR IMAGING GUIDED PORT INSERTION  12/23/2021   IR REPLC DUODEN/JEJUNO TUBE PERCUT W/FLUORO  10/12/2022   IR THORACENTESIS ASP PLEURAL SPACE W/IMG GUIDE  04/19/2022   LAPAROSCOPIC JEJUNOSTOMY N/A 05/09/2022   Procedure: LAPAROSCOPIC JEJUNOSTOMY;  Surgeon: Corliss Skains, MD;  Location: [facility] OR;  Service: Thoracic;  Laterality: N/A;   MULTIPLE TOOTH EXTRACTIONS  2015   NODE DISSECTION  04/05/2022   Procedure: NODE DISSECTION;  Surgeon: Corliss Skains, MD;  Location: [facility] OR;  Service: Thoracic;;   TONSILLECTOMY AND ADENOIDECTOMY     as a child   VIDEO ASSISTED  THORACOSCOPY Right 05/09/2022   Procedure: VIDEO ASSISTED THORACOSCOPY;  Surgeon: Corliss Skains, MD;  Location: [facility] OR;  Service: Thoracic;  Laterality: Right;   VIDEO BRONCHOSCOPY WITH ENDOBRONCHIAL ULTRASOUND Bilateral 12/14/2021   Procedure: VIDEO BRONCHOSCOPY WITH ENDOBRONCHIAL ULTRASOUND;  Surgeon: Leslye Peer, MD;  Location: [facility] ENDOSCOPY;  Service: Cardiopulmonary;  Laterality: Bilateral;    SOCIAL HISTORY: Social History   Socioeconomic History   Marital status:  Single    Spouse name: Not on file   Number of children: Not on file   Years of education: Not on file   Highest education level: Not on file  Occupational History   Not on file  Tobacco Use   Smoking status: Former    Current packs/day: 0.00    Types: Cigars, Cigarettes, Pipe    Quit date: 55    Years since quitting: 41.8   Smokeless tobacco: Former    Types: Chew    Quit date: 1983  Vaping Use   Vaping status: Never Used  Substance and Sexual Activity   Alcohol use: Not Currently    Comment: Rarely   Drug use: No   Sexual activity: Yes  Other Topics Concern   Not on file  Social History Narrative   Not on file   Social Determinants of Health   Financial Resource Strain: Not on file  Food Insecurity: No Food Insecurity (06/28/2022)   Hunger Vital Sign    Worried About Running Out of Food in the Last Year: Never true    Ran Out of Food in the Last Year: Never true  Transportation Needs: No Transportation Needs (06/28/2022)   PRAPARE - Administrator, Civil Service (Medical): No    Lack of Transportation (Non-Medical): No  Physical Activity: Not on file  Stress: Not on file  Social Connections: Unknown (11/14/2022)   [facility]   Social Network    Social Network: Not on file  Intimate Partner Violence: Unknown (11/14/2022)   Received from [facility], [facility]   HITS    Physically Hurt: Not on file    Insult or Talk Down To: Not on file    Threaten Physical Harm: Not on file    Scream or Curse: Not on file    FAMILY HISTORY: Family History  Problem Relation Age of Onset   Hypertension Mother    Diabetes Mother    Bipolar disorder Mother    Hypertension Father    Kidney cancer Sister    Colon cancer Paternal Uncle    Cancer Cousin    Bladder Cancer Cousin    Cancer Cousin     ALLERGIES:  is allergic to procaine, chocolate, and other.  MEDICATIONS:  Current Outpatient Medications  Medication Sig  Dispense Refill   acetaminophen (TYLENOL) 500 MG tablet Take 2 tablets (1,000 mg total) by mouth every 6 (six) hours as needed for mild pain or fever. 30 tablet 0   Alogliptin Benzoate 12.5 MG TABS Take 12.5 mg by mouth daily.     benzonatate (TESSALON) 100 MG capsule Take 100 mg by mouth 3 (three) times daily as needed for cough.     carboxymethylcellulose (REFRESH PLUS) 0.5 % SOLN Place 1 drop into both eyes 2 (two) times daily as needed. 0.5 %     guaiFENesin (ROBITUSSIN) 100 MG/5ML liquid Take 15 mLs by mouth every 4 (four) hours as needed for cough or to loosen phlegm. 120 mL 0   HYDROcodone-acetaminophen (NORCO) 7.5-325 MG  tablet Take 1 tablet by mouth every 6 (six) hours as needed for moderate pain. 30 tablet 0   lansoprazole (PREVACID) 30 MG capsule Take 1 capsule (30 mg total) by mouth 2 (two) times daily before a meal. 180 capsule 0   lisinopril (ZESTRIL) 40 MG tablet Take 40 mg by mouth daily.     Nutritional Supplements (FEEDING SUPPLEMENT, OSMOLITE 1.5 CAL,) LIQD Place 1,710 mLs into feeding tube daily. Tube feedings to be run via J tube from 7 pm through 7 am nightly only 60000 mL 3   olopatadine (PATANOL) 0.1 % ophthalmic solution Place 1 drop into both eyes 2 (two) times daily.     ondansetron (ZOFRAN-ODT) 8 MG disintegrating tablet Take 1 tablet (8 mg total) by mouth every 8 (eight) hours as needed for nausea or vomiting. 90 tablet 3   pioglitazone (ACTOS) 30 MG tablet Take 30 mg by mouth daily.     Sodium Chloride Flush (NORMAL SALINE FLUSH) 0.9 % SOLN 30 mLs by Per J Tube route every 8 (eight) hours. 2700 mL 3   Zinc Oxide (TRIPLE PASTE) 12.8 % ointment Apply topically 2 (two) times daily. To J-Tube site (Patient taking differently: Apply 1 Application topically as needed for irritation. To J-Tube site) 56.7 g 0   Current Facility-Administered Medications  Medication Dose Route Frequency Provider Last Rate Last Admin   amLODipine (NORVASC) tablet 5 mg  5 mg Oral Once Lightfoot,  Eliezer Lofts, MD        REVIEW OF SYSTEMS:   Constitutional: ( - ) fevers, ( - )  chills , ( - ) night sweats Eyes: ( - ) blurriness of vision, ( - ) double vision, ( - ) watery eyes Ears, nose, mouth, throat, and face: ( - ) mucositis, ( - ) sore throat Respiratory: ( - ) cough, ( - ) dyspnea, ( - ) wheezes Cardiovascular: ( - ) palpitation, ( - ) chest discomfort, ( - ) lower extremity swelling Gastrointestinal:  ( + ) nausea, ( - ) heartburn, ( - ) change in bowel habits Skin: ( - ) abnormal skin rashes Lymphatics: ( - ) new lymphadenopathy, ( - ) easy bruising Neurological: ( - ) numbness, ( - ) tingling, ( - ) new weaknesses Behavioral/Psych: ( - ) mood change, ( - ) new changes  All other systems were reviewed with the patient and are negative.  PHYSICAL EXAMINATION: ECOG PERFORMANCE STATUS: 1 - Symptomatic but completely ambulatory  Vitals:   04/18/23 1200  BP: 136/72  Pulse: 73  Resp: 14  Temp: 98.1 F (36.7 C)  SpO2: 98%    Filed Weights   04/18/23 1200  Weight: (!) 331 lb 3.2 oz (150.2 kg)     GENERAL: Well-appearing elderly Caucasian male, alert, no distress and comfortable SKIN: skin color, texture, turgor are normal, no rashes or significant lesions EYES: conjunctiva are pink and non-injected, sclera clear LUNGS: clear to auscultation and percussion with normal breathing effort HEART: regular rate & rhythm and no murmurs and no lower extremity edema Musculoskeletal: no cyanosis of digits and no clubbing  PSYCH: alert & oriented x 3, fluent speech NEURO: no focal motor/sensory deficits  LABORATORY DATA:  I have reviewed the data as listed    Latest Ref Rng & Units 04/18/2023   11:11 AM 01/03/2023    1:53 PM 10/04/2022   11:09 AM  CBC  WBC 4.0 - 10.5 K/uL 5.0  6.0  4.9   Hemoglobin 13.0 - 17.0 g/dL 13.3  13.0  11.8   Hematocrit 39.0 - 52.0 % 39.6  38.6  36.4   Platelets 150 - 400 K/uL 171  199  226        Latest Ref Rng & Units 04/18/2023   11:11 AM  01/03/2023    1:53 PM 10/04/2022   11:09 AM  CMP  Glucose 70 - 99 mg/dL 83  161  096   BUN 8 - 23 mg/dL 21  25  29    Creatinine 0.61 - 1.24 mg/dL 0.45  4.09  8.11   Sodium 135 - 145 mmol/L 136  138  137   Potassium 3.5 - 5.1 mmol/L 4.0  4.1  4.4   Chloride 98 - 111 mmol/L 104  105  102   CO2 22 - 32 mmol/L 28  27  32   Calcium 8.9 - 10.3 mg/dL 9.8  9.7  9.7   Total Protein 6.5 - 8.1 g/dL 6.8  7.0  6.7   Total Bilirubin 0.3 - 1.2 mg/dL 0.7  0.7  0.4   Alkaline Phos 38 - 126 U/L 124  127  125   AST 15 - 41 U/L 22  26  21    ALT 0 - 44 U/L 27  34  24     RADIOGRAPHIC STUDIES: CT CHEST ABDOMEN PELVIS W CONTRAST  Result Date: 04/02/2023 CLINICAL DATA:  History of esophageal cancer, monitor. * Tracking Code: BO * EXAM: CT CHEST, ABDOMEN, AND PELVIS WITH CONTRAST TECHNIQUE: Multidetector CT imaging of the chest, abdomen and pelvis was performed following the standard protocol during bolus administration of intravenous contrast. RADIATION DOSE REDUCTION: This exam was performed according to the departmental dose-optimization program which includes automated exposure control, adjustment of the mA and/or kV according to patient size and/or use of iterative reconstruction technique. CONTRAST:  OMNIPAQUE IOHEXOL 300 MG/ML  SOLN COMPARISON:  Multiple priors including most recent CT October 18, 2022 FINDINGS: CT CHEST FINDINGS Cardiovascular: Right chest Port-A-Cath with tip at the superior cavoatrial junction. Normal caliber thoracic aorta. Aortic atherosclerosis. No central pulmonary embolus on this nondedicated study. Coronary artery calcifications. Mediastinum/Nodes: Enlarged calcified mediastinal and hilar lymph nodes are similar prior examination. No new pathologically enlarged lymph nodes identified. Post esophagectomy with gastric pull-up anatomy. No new suspicious nodularity along the suture line. Similar trace posterior mediastinal fluid/soft tissue stranding. Lungs/Pleura: Solid right upper lobe  perifissural pulmonary nodule measures 6 mm on image 57/4 previously 7 mm. Right upper lobe pulmonary nodule measures 5 mm on image 43/4 unchanged. Left upper lobe pulmonary nodule measures 6 mm on image 42/4, unchanged. Scattered calcified pulmonary nodules are unchanged. No new suspicious pulmonary nodules or masses. Small bilateral pleural effusions. Musculoskeletal: No aggressive lytic or blastic lesion of bone. CT ABDOMEN PELVIS FINDINGS Hepatobiliary: No suspicious hepatic lesion. Gallbladder is unremarkable no biliary ductal dilation. Pancreas: No pancreatic ductal dilation or evidence of acute inflammation. Spleen: No splenomegaly. Near-complete resolution of the perisplenic fluid collection which now measures 4 mm in maximum thickness previously 10 mm on image 48/2. Adrenals/Urinary Tract: Bilateral adrenal glands appear normal. No hydronephrosis. Nonobstructive bilateral renal stones measure up to 10 mm. Kidneys demonstrate symmetric enhancement. Urinary bladder is unremarkable for degree of distension. Stomach/Bowel: No radiopaque enteric contrast material was administered. Gastric pull-through anatomy. Colonic diverticulosis without findings of acute diverticulitis. Percutaneous jejunostomy tube unchanged position. Vascular/Lymphatic: Normal caliber abdominal aorta. Smooth IVC contours. The portal, splenic and superior mesenteric veins are patent. No pathologically enlarged abdominal or pelvic lymph nodes. Reproductive: Dystrophic calcifications  in an enlarged prostate gland. Other: No significant abdominopelvic free fluid. No discrete peritoneal or omental nodularity. Musculoskeletal: No aggressive lytic or blastic lesion of bone. IMPRESSION: 1. Stable examination without evidence of new or progressive disease in the chest, abdomen or pelvis post esophagectomy with gastric pull-up anatomy. 2. Stable calcified mediastinal and hilar lymph nodes. 3. Stable small pulmonary nodules. 4. Stable small pleural  effusions. 5. Near-complete resolution of the perisplenic fluid collection. 6. Nonobstructive bilateral renal stones measure up to 10 mm. 7.  Aortic Atherosclerosis (ICD10-I70.0). Electronically Signed   By: Maudry Mayhew M.D.   On: 04/02/2023 17:34    ASSESSMENT & PLAN Keith Moreno 62 y.o. male with medical history significant for localized esophageal adenocarcinoma who presents for a follow up visit.   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 Stage IIB (cT2,cN0, cM0) --completed EUS and EBUS to finalize staging on 12/14/2021 --biopsy confirms moderate-poorly differentiated adenocarcinoma of the esophagus.  -- Received concurrent chemoradiation, started on 12/26/2021. End chemotherapy on 01/23/2022 (last dose on 01/30/2022 held due to neutropenia). Last radiation 02/02/2022. Plan:  --Labs today were reviewed and shows improvement. WBC 5.0, Hgb 13.3, MCV 87.6, Plt 171 --Patient will be due for his surveillance CT scan in April 2025.  Last scan in Oct 2024 showed no evidence of recurrent disease.  --RTC in 3 months for a follow up with Dr. Leonides Schanz   #Hypotension--improved --Today's BP is 136/72 --Current BP medications include amlodipine 10 mg PO daily  --Advised to stay hydrated.   #Supportive Care -- chemotherapy education completed -- port placed, continue to flush q 6 weeks. Patient OK with having port removed. Will place order today.  -- zofran 8mg  q8H PRN and compazine 10mg  PO q6H for nausea -- EMLA cream for port -- no pain medication required at this time.   Orders Placed This Encounter  Procedures   IR Removal Tun Access W/ Port W/O FL    Standing Status:   Future    Standing Expiration Date:   04/17/2024    Order Specific Question:   Reason for exam:    Answer:   chemotherapy complete, requesting port removal    Order Specific Question:   Preferred Imaging Location?    Answer:   [facility]    All questions were answered. The  patient knows to call the clinic with any problems, questions or concerns.  I have spent a total of 30 minutes minutes of face-to-face and non-face-to-face time, preparing to see the patient, performing a medically appropriate examination, counseling and educating the patient, , communicating with other health care professionals, documenting clinical information in the electronic health record, and care coordination.   Ulysees Barns, MD Department of Hematology/Oncology [facility] Phone: [phone] Pager: [phone] Email: [email or web address]  04/18/2023 1:58 PM

## 2023-04-18 ENCOUNTER — Inpatient Hospital Stay (HOSPITAL_BASED_OUTPATIENT_CLINIC_OR_DEPARTMENT_OTHER): Payer: No Typology Code available for payment source | Admitting: Hematology and Oncology

## 2023-04-18 ENCOUNTER — Inpatient Hospital Stay: Payer: No Typology Code available for payment source | Attending: Physician Assistant

## 2023-04-18 VITALS — BP 136/72 | HR 73 | Temp 98.1°F | Resp 14 | Wt 331.2 lb

## 2023-04-18 DIAGNOSIS — Z95828 Presence of other vascular implants and grafts: Secondary | ICD-10-CM

## 2023-04-18 DIAGNOSIS — Z800 Family history of malignant neoplasm of digestive organs: Secondary | ICD-10-CM | POA: Diagnosis not present

## 2023-04-18 DIAGNOSIS — Z87442 Personal history of urinary calculi: Secondary | ICD-10-CM | POA: Diagnosis not present

## 2023-04-18 DIAGNOSIS — Z9049 Acquired absence of other specified parts of digestive tract: Secondary | ICD-10-CM | POA: Insufficient documentation

## 2023-04-18 DIAGNOSIS — Z8501 Personal history of malignant neoplasm of esophagus: Secondary | ICD-10-CM | POA: Insufficient documentation

## 2023-04-18 DIAGNOSIS — Z818 Family history of other mental and behavioral disorders: Secondary | ICD-10-CM | POA: Insufficient documentation

## 2023-04-18 DIAGNOSIS — Z87891 Personal history of nicotine dependence: Secondary | ICD-10-CM | POA: Insufficient documentation

## 2023-04-18 DIAGNOSIS — C159 Malignant neoplasm of esophagus, unspecified: Secondary | ICD-10-CM

## 2023-04-18 DIAGNOSIS — N200 Calculus of kidney: Secondary | ICD-10-CM | POA: Diagnosis not present

## 2023-04-18 DIAGNOSIS — Z8052 Family history of malignant neoplasm of bladder: Secondary | ICD-10-CM | POA: Insufficient documentation

## 2023-04-18 DIAGNOSIS — Z8051 Family history of malignant neoplasm of kidney: Secondary | ICD-10-CM | POA: Diagnosis not present

## 2023-04-18 DIAGNOSIS — Z923 Personal history of irradiation: Secondary | ICD-10-CM | POA: Insufficient documentation

## 2023-04-18 DIAGNOSIS — C155 Malignant neoplasm of lower third of esophagus: Secondary | ICD-10-CM | POA: Diagnosis present

## 2023-04-18 DIAGNOSIS — Z833 Family history of diabetes mellitus: Secondary | ICD-10-CM | POA: Diagnosis not present

## 2023-04-18 DIAGNOSIS — I700 Atherosclerosis of aorta: Secondary | ICD-10-CM | POA: Insufficient documentation

## 2023-04-18 DIAGNOSIS — Z9089 Acquired absence of other organs: Secondary | ICD-10-CM | POA: Diagnosis not present

## 2023-04-18 DIAGNOSIS — J90 Pleural effusion, not elsewhere classified: Secondary | ICD-10-CM | POA: Diagnosis not present

## 2023-04-18 DIAGNOSIS — Z8249 Family history of ischemic heart disease and other diseases of the circulatory system: Secondary | ICD-10-CM | POA: Insufficient documentation

## 2023-04-18 DIAGNOSIS — K5730 Diverticulosis of large intestine without perforation or abscess without bleeding: Secondary | ICD-10-CM | POA: Insufficient documentation

## 2023-04-18 DIAGNOSIS — N400 Enlarged prostate without lower urinary tract symptoms: Secondary | ICD-10-CM | POA: Diagnosis not present

## 2023-04-18 DIAGNOSIS — Z79899 Other long term (current) drug therapy: Secondary | ICD-10-CM | POA: Diagnosis not present

## 2023-04-18 DIAGNOSIS — Z8616 Personal history of COVID-19: Secondary | ICD-10-CM | POA: Insufficient documentation

## 2023-04-18 LAB — CMP (CANCER CENTER ONLY)
ALT: 27 U/L (ref 0–44)
AST: 22 U/L (ref 15–41)
Albumin: 3.9 g/dL (ref 3.5–5.0)
Alkaline Phosphatase: 124 U/L (ref 38–126)
Anion gap: 4 — ABNORMAL LOW (ref 5–15)
BUN: 21 mg/dL (ref 8–23)
CO2: 28 mmol/L (ref 22–32)
Calcium: 9.8 mg/dL (ref 8.9–10.3)
Chloride: 104 mmol/L (ref 98–111)
Creatinine: 0.88 mg/dL (ref 0.61–1.24)
GFR, Estimated: >60 mL/min (ref >60)
Glucose, Bld: 83 mg/dL (ref 70–99)
Potassium: 4 mmol/L (ref 3.5–5.1)
Sodium: 136 mmol/L (ref 135–145)
Total Bilirubin: 0.7 mg/dL (ref 0.3–1.2)
Total Protein: 6.8 g/dL (ref 6.5–8.1)

## 2023-04-18 LAB — CBC WITH DIFFERENTIAL (CANCER CENTER ONLY)
Abs Immature Granulocytes: 0.02 10*3/uL (ref 0.00–0.07)
Basophils Absolute: 0 10*3/uL (ref 0.0–0.1)
Basophils Relative: 1 %
Eosinophils Absolute: 0.1 10*3/uL (ref 0.0–0.5)
Eosinophils Relative: 3 %
HCT: 39.6 % (ref 39.0–52.0)
Hemoglobin: 13.3 g/dL (ref 13.0–17.0)
Immature Granulocytes: 0 %
Lymphocytes Relative: 10 %
Lymphs Abs: 0.5 10*3/uL — ABNORMAL LOW (ref 0.7–4.0)
MCH: 29.4 pg (ref 26.0–34.0)
MCHC: 33.6 g/dL (ref 30.0–36.0)
MCV: 87.6 fL (ref 80.0–100.0)
Monocytes Absolute: 0.4 10*3/uL (ref 0.1–1.0)
Monocytes Relative: 8 %
Neutro Abs: 3.9 10*3/uL (ref 1.7–7.7)
Neutrophils Relative %: 78 %
Platelet Count: 171 10*3/uL (ref 150–400)
RBC: 4.52 MIL/uL (ref 4.22–5.81)
RDW: 13.9 % (ref 11.5–15.5)
WBC Count: 5 10*3/uL (ref 4.0–10.5)
nRBC: 0 % (ref 0.0–0.2)

## 2023-04-18 MED ORDER — HEPARIN SOD (PORK) LOCK FLUSH 100 UNIT/ML IV SOLN
500.0000 [IU] | Freq: Once | INTRAVENOUS | Status: AC
  Administered 2023-04-18: 500 [IU]

## 2023-04-18 MED ORDER — SODIUM CHLORIDE 0.9% FLUSH
10.0000 mL | Freq: Once | INTRAVENOUS | Status: AC
  Administered 2023-04-18: 10 mL

## 2023-05-03 ENCOUNTER — Other Ambulatory Visit: Payer: Self-pay | Admitting: Radiology

## 2023-05-04 ENCOUNTER — Ambulatory Visit (HOSPITAL_COMMUNITY)
Admission: RE | Admit: 2023-05-04 | Discharge: 2023-05-04 | Disposition: A | Discharge status: Home / Self Care | Payer: No Typology Code available for payment source | Source: Ambulatory Visit | Attending: Hematology and Oncology

## 2023-05-04 DIAGNOSIS — Z452 Encounter for adjustment and management of vascular access device: Secondary | ICD-10-CM | POA: Diagnosis present

## 2023-05-04 DIAGNOSIS — Z95828 Presence of other vascular implants and grafts: Secondary | ICD-10-CM

## 2023-05-04 DIAGNOSIS — Z9221 Personal history of antineoplastic chemotherapy: Secondary | ICD-10-CM | POA: Diagnosis not present

## 2023-05-04 HISTORY — PX: IR REMOVAL TUN ACCESS W/ PORT W/O FL MOD SED: IMG2290

## 2023-05-04 MED ORDER — LIDOCAINE-EPINEPHRINE 1 %-1:100000 IJ SOLN
20.0000 mL | Freq: Once | INTRAMUSCULAR | Status: AC
  Administered 2023-05-04: 10 mL via INTRADERMAL

## 2023-05-04 MED ORDER — LIDOCAINE-EPINEPHRINE 1 %-1:100000 IJ SOLN
INTRAMUSCULAR | Status: AC
  Filled 2023-05-04: qty 1

## 2023-05-04 NOTE — Procedures (Signed)
  Procedure:  Removal R chest port catheter   Preprocedure diagnosis: The encounter diagnosis was Port-A-Cath in place. Postprocedure diagnosis: same EBL:    minimal Complications:   none immediate  See full dictation in [employer].  Thora Lance MD Main # [phone] Pager  [phone] Mobile [phone]

## 2023-05-11 ENCOUNTER — Emergency Department (HOSPITAL_COMMUNITY): Payer: No Typology Code available for payment source

## 2023-05-11 ENCOUNTER — Other Ambulatory Visit: Payer: Self-pay

## 2023-05-11 ENCOUNTER — Observation Stay (HOSPITAL_COMMUNITY)
Admission: EM | Admit: 2023-05-11 | Discharge: 2023-05-15 | Disposition: A | Discharge status: Home / Self Care | Payer: No Typology Code available for payment source | Attending: Family Medicine | Admitting: Family Medicine

## 2023-05-11 ENCOUNTER — Encounter (HOSPITAL_COMMUNITY): Payer: Self-pay

## 2023-05-11 DIAGNOSIS — I4891 Unspecified atrial fibrillation: Secondary | ICD-10-CM | POA: Diagnosis not present

## 2023-05-11 DIAGNOSIS — R9431 Abnormal electrocardiogram [ECG] [EKG]: Secondary | ICD-10-CM | POA: Insufficient documentation

## 2023-05-11 DIAGNOSIS — I959 Hypotension, unspecified: Secondary | ICD-10-CM | POA: Insufficient documentation

## 2023-05-11 DIAGNOSIS — I110 Hypertensive heart disease with heart failure: Secondary | ICD-10-CM | POA: Diagnosis not present

## 2023-05-11 DIAGNOSIS — Z8501 Personal history of malignant neoplasm of esophagus: Secondary | ICD-10-CM | POA: Diagnosis not present

## 2023-05-11 DIAGNOSIS — I5040 Unspecified combined systolic (congestive) and diastolic (congestive) heart failure: Secondary | ICD-10-CM | POA: Diagnosis not present

## 2023-05-11 DIAGNOSIS — Z87891 Personal history of nicotine dependence: Secondary | ICD-10-CM | POA: Insufficient documentation

## 2023-05-11 DIAGNOSIS — Z79899 Other long term (current) drug therapy: Secondary | ICD-10-CM | POA: Insufficient documentation

## 2023-05-11 DIAGNOSIS — K5900 Constipation, unspecified: Secondary | ICD-10-CM | POA: Diagnosis not present

## 2023-05-11 DIAGNOSIS — E119 Type 2 diabetes mellitus without complications: Secondary | ICD-10-CM | POA: Diagnosis not present

## 2023-05-11 DIAGNOSIS — Z7984 Long term (current) use of oral hypoglycemic drugs: Secondary | ICD-10-CM | POA: Diagnosis not present

## 2023-05-11 DIAGNOSIS — R079 Chest pain, unspecified: Principal | ICD-10-CM | POA: Diagnosis present

## 2023-05-11 DIAGNOSIS — I9589 Other hypotension: Secondary | ICD-10-CM

## 2023-05-11 DIAGNOSIS — Z434 Encounter for attention to other artificial openings of digestive tract: Secondary | ICD-10-CM

## 2023-05-11 LAB — COMPREHENSIVE METABOLIC PANEL
ALT: 23 U/L (ref 0–44)
AST: 22 U/L (ref 15–41)
Albumin: 3.7 g/dL (ref 3.5–5.0)
Alkaline Phosphatase: 101 U/L (ref 38–126)
Anion gap: 11 (ref 5–15)
BUN: 16 mg/dL (ref 8–23)
CO2: 21 mmol/L — ABNORMAL LOW (ref 22–32)
Calcium: 9.3 mg/dL (ref 8.9–10.3)
Chloride: 104 mmol/L (ref 98–111)
Creatinine, Ser: 1.06 mg/dL (ref 0.61–1.24)
GFR, Estimated: >60 mL/min (ref >60)
Glucose, Bld: 124 mg/dL — ABNORMAL HIGH (ref 70–99)
Potassium: 3.3 mmol/L — ABNORMAL LOW (ref 3.5–5.1)
Sodium: 136 mmol/L (ref 135–145)
Total Bilirubin: 1.2 mg/dL (ref 0.3–1.2)
Total Protein: 6.5 g/dL (ref 6.5–8.1)

## 2023-05-11 LAB — TROPONIN I (HIGH SENSITIVITY)
Troponin I (High Sensitivity): 6 ng/L (ref <18)
Troponin I (High Sensitivity): 7 ng/L (ref <18)

## 2023-05-11 LAB — CBC
HCT: 41.6 % (ref 39.0–52.0)
Hemoglobin: 13.6 g/dL (ref 13.0–17.0)
MCH: 29.2 pg (ref 26.0–34.0)
MCHC: 32.7 g/dL (ref 30.0–36.0)
MCV: 89.3 fL (ref 80.0–100.0)
Platelets: 188 10*3/uL (ref 150–400)
RBC: 4.66 MIL/uL (ref 4.22–5.81)
RDW: 14.3 % (ref 11.5–15.5)
WBC: 10.1 10*3/uL (ref 4.0–10.5)
nRBC: 0 % (ref 0.0–0.2)

## 2023-05-11 LAB — I-STAT CHEM 8, ED
BUN: 15 mg/dL (ref 8–23)
Calcium, Ion: 1.23 mmol/L (ref 1.15–1.40)
Chloride: 103 mmol/L (ref 98–111)
Creatinine, Ser: 0.9 mg/dL (ref 0.61–1.24)
Glucose, Bld: 121 mg/dL — ABNORMAL HIGH (ref 70–99)
HCT: 43 % (ref 39.0–52.0)
Hemoglobin: 14.6 g/dL (ref 13.0–17.0)
Potassium: 3.3 mmol/L — ABNORMAL LOW (ref 3.5–5.1)
Sodium: 138 mmol/L (ref 135–145)
TCO2: 21 mmol/L — ABNORMAL LOW (ref 22–32)

## 2023-05-11 LAB — LIPASE, BLOOD: Lipase: 20 U/L (ref 11–51)

## 2023-05-11 MED ORDER — INSULIN ASPART 100 UNIT/ML IJ SOLN: 0.0000 [IU] | Freq: Three times a day (TID) | INTRAMUSCULAR | Status: DC

## 2023-05-11 MED ORDER — PANTOPRAZOLE SODIUM 20 MG PO TBEC: 40.0000 mg | DELAYED_RELEASE_TABLET | Freq: Every day | ORAL | Status: DC

## 2023-05-11 MED ORDER — LANSOPRAZOLE 30 MG PO TBDD: 30.0000 mg | DELAYED_RELEASE_TABLET | Freq: Two times a day (BID) | ORAL | Status: DC

## 2023-05-11 MED ORDER — MELATONIN 5 MG PO TABS: 5.0000 mg | ORAL_TABLET | Freq: Every evening | ORAL | Status: DC | PRN

## 2023-05-11 MED ORDER — IOHEXOL 350 MG/ML SOLN
100.0000 mL | Freq: Once | INTRAVENOUS | Status: AC | PRN
  Administered 2023-05-11: 100 mL via INTRAVENOUS

## 2023-05-11 MED ORDER — POTASSIUM CHLORIDE 10 MEQ/100ML IV SOLN
10.0000 meq | INTRAVENOUS | Status: AC
  Administered 2023-05-12 (×4): 10 meq via INTRAVENOUS
  Filled 2023-05-11: qty 100

## 2023-05-11 MED ORDER — SODIUM CHLORIDE 0.9 % IV BOLUS
500.0000 mL | Freq: Once | INTRAVENOUS | Status: AC
  Administered 2023-05-11: 500 mL via INTRAVENOUS

## 2023-05-11 MED ORDER — DOCUSATE SODIUM 50 MG PO CAPS
50.0000 mg | ORAL_CAPSULE | Freq: Every day | ORAL | Status: DC
  Filled 2023-05-11: qty 1

## 2023-05-11 MED ORDER — HYDROMORPHONE HCL 1 MG/ML IJ SOLN
0.5000 mg | Freq: Once | INTRAMUSCULAR | Status: AC
  Administered 2023-05-11: 0.5 mg via INTRAVENOUS
  Filled 2023-05-11: qty 1

## 2023-05-11 MED ORDER — SODIUM CHLORIDE 0.9 % IV BOLUS
1000.0000 mL | Freq: Once | INTRAVENOUS | Status: AC
  Administered 2023-05-11: 1000 mL via INTRAVENOUS

## 2023-05-11 MED ORDER — OXYCODONE HCL 5 MG PO TABS: 5.0000 mg | ORAL_TABLET | ORAL | Status: DC | PRN

## 2023-05-11 MED ORDER — SERTRALINE HCL 50 MG PO TABS
25.0000 mg | ORAL_TABLET | Freq: Every day | ORAL | Status: DC
  Administered 2023-05-12 – 2023-05-15 (×4): 25 mg via ORAL
  Filled 2023-05-11 (×4): qty 1

## 2023-05-11 MED ORDER — FAMOTIDINE IN NACL 20-0.9 MG/50ML-% IV SOLN
20.0000 mg | Freq: Once | INTRAVENOUS | Status: AC
  Administered 2023-05-11: 20 mg via INTRAVENOUS
  Filled 2023-05-11: qty 50

## 2023-05-11 MED ORDER — ACETAMINOPHEN 650 MG RE SUPP: 650.0000 mg | Freq: Four times a day (QID) | RECTAL | Status: DC | PRN

## 2023-05-11 MED ORDER — SUCRALFATE 1 G PO TABS: 1.0000 g | ORAL_TABLET | Freq: Once | ORAL | Status: DC

## 2023-05-11 MED ORDER — PANTOPRAZOLE SODIUM 40 MG PO TBEC: 40.0000 mg | DELAYED_RELEASE_TABLET | Freq: Two times a day (BID) | ORAL | Status: DC

## 2023-05-11 MED ORDER — DOCUSATE SODIUM 100 MG PO CAPS
100.0000 mg | ORAL_CAPSULE | Freq: Every day | ORAL | Status: DC
  Administered 2023-05-12 – 2023-05-13 (×2): 100 mg via ORAL
  Filled 2023-05-11 (×3): qty 1

## 2023-05-11 MED ORDER — ACETAMINOPHEN 325 MG PO TABS: 650.0000 mg | ORAL_TABLET | Freq: Four times a day (QID) | ORAL | Status: DC | PRN

## 2023-05-11 MED ORDER — ACETAMINOPHEN 500 MG PO TABS
1000.0000 mg | ORAL_TABLET | Freq: Four times a day (QID) | ORAL | Status: DC
  Administered 2023-05-12 – 2023-05-14 (×8): 1000 mg via ORAL
  Filled 2023-05-11 (×9): qty 2

## 2023-05-11 MED ORDER — ENOXAPARIN SODIUM 40 MG/0.4ML IJ SOSY: 40.0000 mg | PREFILLED_SYRINGE | INTRAMUSCULAR | Status: DC

## 2023-05-11 MED ORDER — ADULT MULTIVITAMIN W/MINERALS CH
1.0000 | ORAL_TABLET | Freq: Every day | ORAL | Status: DC
  Administered 2023-05-12 – 2023-05-15 (×4): 1 via ORAL
  Filled 2023-05-11 (×4): qty 1

## 2023-05-11 MED ORDER — PANTOPRAZOLE SODIUM 40 MG IV SOLR
40.0000 mg | Freq: Once | INTRAVENOUS | Status: AC
  Administered 2023-05-11: 40 mg via INTRAVENOUS
  Filled 2023-05-11: qty 10

## 2023-05-11 MED ORDER — ACETAMINOPHEN 650 MG RE SUPP
650.0000 mg | Freq: Four times a day (QID) | RECTAL | Status: DC
Start: 2023-05-11 — End: 2023-05-15
  Filled 2023-05-11: qty 1

## 2023-05-11 MED ORDER — MELATONIN 5 MG PO TABS
5.0000 mg | ORAL_TABLET | Freq: Every evening | ORAL | Status: DC | PRN
  Administered 2023-05-12 – 2023-05-14 (×3): 5 mg via ORAL
  Filled 2023-05-11 (×3): qty 1

## 2023-05-11 NOTE — ED Triage Notes (Signed)
Pt present to ED from home with c/o chest pain, shortness of breath, onset 1230 after waking from nap. Per EMS pt noted with decreased saturations, placed on nonrebreather. Pt c/o rated chest pain 8/10 when deep breathing. Pt received aspirin 324mg  , NS, nitroglycerin SL times one dose. Pt drowsy but easily aroused, pt A&Ox3 at this time.

## 2023-05-11 NOTE — Assessment & Plan Note (Deleted)
Sharp, tight central chest pain with shallow breathing and radiation to the back. EKG with evidence of possible Afib, pt has not been diagnosed with this previously but had episode of Afib noted during hospital stay in 2023 and prior EKGs were similar. CT angio chest/abd/pelvis significant for R middle lobe atelectasis which could be developing PNA. Pt has hx of esophageal adenocarcinoma s/p esophagectomy in 2023. Suspect possible esophageal leak vs GERD vs aspiration pneumonia. --admit to [facility]MT[facility], med-tele, Dr McDiarmid attending --tylenol 1000 mg q6hr scheduled, K-pad for mild pain --oxycodone 5mg  q4hr PRN for severe pain --protonix 40mg  BID  --CT surgery curbsided by ED provider, recommend observation and barium swallow in AM to assess for esophageal perforation and  --continuous cardiac monitoring --Echocardiogram in AM --SLP and RD eval and treat --AM BMP, CBC

## 2023-05-11 NOTE — Hospital Course (Addendum)
Destin Vinsant is a 62 y.o.male with a history of esophageal adenocarcinoma s/p esophagectomy who was admitted to the [facility] medicine Teaching Service at [facility] for chest pain. His hospital course is detailed below:  Chest pain Acute onset of sharp, tight central chest pain unresponsive to nitroglycerin with no acute changes on EKG, negative troponins, no evidence of PE or dissection on CT angio. CT surgery curbsided in ED, recommended barium swallow to rule out esophageal rupture in pt with hx of esophagectomy in 2023. This showed ***. Echo was ordered to rule out pericarditis, which showed ***.  ?New Afib Seen on initial EKG. Per chart review, pt had episode of Afib seen on telemetry during hospitalization in 2023. Repeat EKG showed ***  Hypotension Prior to arrival to the ED, likely due to nitroglycerin administration. Improved with IVF. Home amlodipine was initially held and added back prior to discharge***.   Other chronic conditions were medically managed with home medications and formulary alternatives as necessary (***)  PCP Follow-up Recommendations:

## 2023-05-11 NOTE — H&P (Cosign Needed Addendum)
Hospital Admission History and Physical Service Pager: [phone]  Patient name: Keith Moreno Medical record number: [id number] Date of Birth: Nov 28, 1960 Age: 62 y.o. Gender: male  Primary Care Provider: [facility] Consultants: Cardiothoracic surgery Code Status: FULL  Preferred Emergency Contact: Wife Bernell List) [phone]    Chief Complaint: chest pain  Assessment and Plan: Keith Moreno is a 62 y.o. male presenting with chest pain and hypotension. Differential for this patient's presentation of this includes:  ACS: unlikely given no acute findings on EKG (cardiology paged by EDP who did not think ischemic), troponins not elevated (6 > 7) PE: possible given chest pain plus dyspnea, initial hypotension, and tachycardia, however no evidence of PE seen on chest CTA (discussed with radiologist) Aortic dissection: unlikely given negative CT chest/abdomen/pelvis and stabilized vitals Esophageal rupture or mass: no evidence of perforation on CT, however gold standard is barium swallow per CT surgery  GERD: likely contributing given history of esophagectomy including removal of esophageal sphincter  Aspiration pneumonia: possible aspiration event in pt with esophagectomy who cannot tolerate PO liquids well, plus CT chest finding of R middle lobe subsegmental atelectasis, no fevers or white leukocytosis, cough similar to his normal Pericarditis: unlikely given trace pericardial fluid seen on CT chest, will obtain echo Pancreatitis: fits description of pain with radiation to the back however no evidence of this on imaging and lipase WNL Pleuritis: Possible given pain with inhalation  Myocarditis: Unlikely as no fevers Medication reaction from nitoglycerin: most likely cause of hypotension given timing of low pressures after NG administration by EMS and improvement with 1.5L NS bolus Assessment & Plan Chest pain, unspecified type Sharp, tight central chest pain with  shallow breathing and radiation to the back. EKG with evidence of possible Afib, pt has not been diagnosed with this previously but had episode of Afib noted during hospital stay in 2023 and prior EKGs were similar. CT angio chest/abd/pelvis significant for R middle lobe atelectasis which could be developing PNA. Pt has hx of esophageal adenocarcinoma s/p esophagectomy in 2023. Suspect possible esophageal leak vs GERD vs aspiration pneumonia. --admit to FMTS, med-tele, Dr McDiarmid attending --tylenol 1000 mg q6hr scheduled, K-pad for mild pain --oxycodone 5mg  q4hr PRN for severe pain --protonix 40mg  BID  --CT surgery curbsided by ED provider, recommend observation and barium swallow in AM to assess for esophageal perforation and  --continuous cardiac monitoring --Echocardiogram in AM --SLP and RD eval and treat --AM BMP, CBC Other specified hypotension SBP as low as 70s as reported by EMS following nitroglycerin administration. Has improved with IV fluid boluses to 100s-120s/70s-80s. Does have hx of hypotension per chart review. --hold home amlodipine 10mg  nightly, will add back as appropriate --continue to monitor vitals per unit Prolonged Q-T interval on ECG 532 on EKG, manually calculated to 528 --repeat EKG in AM --avoid QT prolonging medications  Chronic and Stable Conditions: Constipation: colace 50mg  nightly PTSD/insomina: melatonin 5 mg nightly (reportedly takes 50mg  nightly at home), consider prazosin  T2DM: holding home pioglitazone 30mg  and sitagliptin 50mg  daily; sSSI and CBGs AC/HS GAD: Continue sertraline 25 mg daily  FEN/GI: regular with thin fluids, multivitamin VTE Prophylaxis: lovenox  Disposition: med-tele  History of Present Illness:  Keith Moreno is a 62 y.o. male presenting with chest pain.  States he had acute onset of chest pain and dyspnea around 1230 today. The pain, described as sharp and constricting, was localized in the mid-sternal region and was  associated with back pain. No radiation to jaw  or arm. The patient rated the pain as 8 out of 10 in severity. The pain was severe enough to limit the patient's ability to take deep breaths, resulting in shallow breathing. He also reported feeling dizzy and experiencing abdominal tenderness around the diaphragm area during the episode. No aspiration prior to event. This episode of chest pain was a new occurrence for the patient, with no similar previous episodes reported.  No sick symptoms or fevers. Denies any hx of heart attack or stroke. removed Port Cath on right side of chest last Friday without issue. No changes to medicines. No trauma to chest. States he still has some chest pain. States the nitroglycerin did not help but dilaudid did.  Coughing at baseline after esophagectomy. Just mucus. No blood. No change in coughing frequency or sputum production. No vomiting, nausea, constipation. Takes stool softeners and had a loose stool this AM No recent surgeries. No calf pain or swelling Can't lay down flat after esophagectomy d/t reflux symptoms  Usually does a mix of oral solid intake and nutrition through a J-tube. However, due to issues with the supply of [employer] has been primarily taking water through the J-tube for the past two weeks.   In the ED, got a 1.5L bolus of NS due to hypotension and was given IV protonix, pepcid for potential esophageal erosion. Cardiology was curbsided to review EKG, confirmed that there are no ischemic changes; troponin 6>7. BP improved to 110s/70 and pt got a dose of 0.5mg  dilaudid for pain x 2. CT angio chest/abdomen/pelvis showed no aortic dissection, PE, or pneumothorax. CT surgery was curbsided and stated there is a low probability of esophageal rupture/performation but recommended barium swallow to confirm and admission for observation.  Review Of Systems: Per HPI with the following additions:   Pertinent Past Medical History: Esophageal adenocarcinoma  s/p esophagectomy in 2023 HTN PTSD Anxiety  Remainder reviewed in history tab.   Pertinent Past Surgical History: Esophagectomy Jtube Fixation right humerus Tonsillectomy as a child  Remainder reviewed in history tab.   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   Important Outpatient Medications: Amlodipine 10mg  nightly Pioglitazone 30mg  daily Sitagliptin 50mg  nightly Docusate 2-3 capsules nightly Lasoprazole 30mg  BID before meals Ondasetron 8mg  PRN Melatonin 50mg  nightly Multivitamin daily Feeding supplement--up until 2 weeks ago; has been doing water instead in J tube Sertraline 25 mg in mornings Desitin cream  Remainder reviewed in medication history.   Objective: BP 115/73   Pulse 100   Temp 98.6 F (37 C) (Oral)   Resp (!) 28   Ht 6' (1.829 m)   Wt (!) 150 kg   SpO2 95%   BMI 44.85 kg/m  Exam: General: older, chronically ill appearing male sitting up in bed, pleasant and conversant, in NAD Eyes: anicteric, non-injected conjunctiva bilaterally ENTM: no rhinorrhea, moist mucus membranes Neck: supple, no obvious masses or thyromegaly Cardiovascular: RRR, normal S1/S2, 2+ radial pulses Respiratory: CTAB, normal WOB on RA, no wheezes, rales, or rhonchi; no tenderness to anterior chest wall Gastrointestinal: normoactive bowel sounds, mild epigastric tenderness to palpation, soft, non distended Derm: no obvious rashes or lesions, clean and dry dressing in place over J-tube at L abodmen, no surrounding erythema Neuro: awake and alert,  CNII-XII grossly intact, moving all extremities spontaneously Psych: appropriate mood and affect  Labs:  CBC BMET  Recent Labs  Lab 05/11/23 1534  05/11/23 1555  WBC 10.1  --   HGB 13.6 14.6  HCT 41.6 43.0  PLT 188  --    Recent Labs  Lab 05/11/23 1536 05/11/23 1555  NA 136 138  K 3.3* 3.3*  CL 104 103  CO2 21*  --   BUN 16 15  CREATININE 1.06 0.90  GLUCOSE 124* 121*  CALCIUM 9.3  --     Troponins: 6 > 7 Lipase: 20  EKG: irregular rate and rhythm without discernable P waves, Qtc 532   Imaging Studies Performed:  CXR: Previous history of a esophagectomy and gastric pull-through with widening of the mediastinum. Underinflation.  CT angio chest/abd/pelvis:  Cardiovascular: There is no cardiomegaly. Trace pericardial effusion. Mild atherosclerotic calcification of the thoracic aorta. No aneurysmal dilatation or dissection. The origins of the great vessels of the aortic arch and the central pulmonary arteries appear patent.   Mediastinum/Nodes: Multiple enlarged and partially calcified hilar and mediastinal lymph nodes, likely related to underlying granulomatous disease. Postsurgical changes of esophagectomy and gastric pull-through with retained ingested content. No mediastinal fluid collection.   Lungs/Pleura: Right middle lobe subsegmental atelectasis. Pneumonia is not excluded. Small left pleural effusion. Several scattered bilateral pulmonary nodules measure up to 5 mm. Some nodules demonstrate punctate calcification, likely granuloma. There is no pneumothorax. The central airways are patent.  1. No acute intrathoracic, abdominal, or pelvic pathology. No aortic dissection or aneurysm. 2. Postsurgical changes of esophagectomy and gastric pull-through. 3. Similar appearance of hilar and mediastinal lymph nodes as well as pulmonary nodules. 4. Nonobstructing bilateral lower pole renal calculi. No hydronephrosis. 5. Colonic diverticulosis. No bowel obstruction. 6.  Aortic Atherosclerosis (ICD10-I70.0).  Lorayne Bender, MD 05/11/2023, 9:22 PM PGY-1, [facility]  FPTS Intern pager: [phone],  text pages welcome Secure chat group [facility] Teaching Service   Upper Level Addendum:  I have seen and evaluated this patient along with Dr. Dolan Amen and reviewed the above note, making necessary revisions as appropriate.  I agree with the medical decision making and physical exam as noted above.  Levin Erp, MD PGY-3 [facility] Family Medicine Residency

## 2023-05-11 NOTE — ED Notes (Signed)
ED TO INPATIENT HANDOFF REPORT  ED Nurse Name and Phone #: France Ravens [phone]  S Name/Age/Gender Keith Moreno 62 y.o. male Room/Bed: 029C/029C  Code Status   Code Status: Full Code  Home/SNF/Other Home Patient oriented to: self, place, time, and situation Is this baseline? Yes   Triage Complete: Triage complete  Chief Complaint Chest pain [R07.9]  Triage Note Pt present to ED from home with c/o chest pain, shortness of breath, onset 1230 after waking from nap. Per EMS pt noted with decreased saturations, placed on nonrebreather. Pt c/o rated chest pain 8/10 when deep breathing. Pt received aspirin 324mg  , NS, nitroglycerin SL times one dose. Pt drowsy but easily aroused, pt A&Ox3 at this time.   Allergies Allergies  Allergen Reactions   Procaine Shortness Of Breath    OK to use Lidocaine for IV starts      Chocolate     Hyperactivity, respiratory distress   Lisinopril     Other Reaction(s): Cough   Other     Novocaine- respiratory distress    Level of Care/Admitting Diagnosis ED Disposition     ED Disposition  Admit   Condition  --   Comment  Hospital Area: [facility] [[id number]]  Level of Care: Telemetry Medical [104]  May place patient in observation at [facility] or [facility] if equivalent level of care is available:: No  Covid Evaluation: Asymptomatic - no recent exposure (last 10 days) testing not required  Diagnosis: Chest pain [744799]  Admitting Physician: Lorayne Bender [[id number]]  Attending Physician: Perley Jain, TODD D [[id number]]          B Medical/Surgery History Past Medical History:  Diagnosis Date   Anxiety    Cancer (HCC)    Esophageal Cancer   COVID 2022   mild case   Dyspnea    r/t chemo and radiation   Headache    History of kidney stones    Hypertension    Kidney infection    at age 33   Pneumonia    62 years old   Pre-diabetes    PTSD (post-traumatic stress disorder)    per pt, if woken up suddenly he  "cocks back arm" as if to punch but usually wakes up enough to come to before he hits anyone   Past Surgical History:  Procedure Laterality Date   BIOPSY  11/13/2021   Procedure: BIOPSY;  Surgeon: Beverley Fiedler, MD;  Location: [facility] ENDOSCOPY;  Service: Gastroenterology;;   BIOPSY  12/14/2021   Procedure: BIOPSY;  Surgeon: Lemar Lofty., MD;  Location: [facility] ENDOSCOPY;  Service: Gastroenterology;;   BRONCHIAL BRUSHINGS  12/14/2021   Procedure: BRONCHIAL BRUSHINGS;  Surgeon: Leslye Peer, MD;  Location: [facility] ENDOSCOPY;  Service: Cardiopulmonary;;   BRONCHIAL NEEDLE ASPIRATION BIOPSY  12/14/2021   Procedure: BRONCHIAL NEEDLE ASPIRATION BIOPSIES;  Surgeon: Leslye Peer, MD;  Location: [facility] ENDOSCOPY;  Service: Cardiopulmonary;;   COLONOSCOPY  2018   CYST EXCISION  2022   left side of head   ESOPHAGEAL STENT PLACEMENT N/A 06/27/2022   Procedure: ESOPHAGEAL STENT REMOVAL;  Surgeon: Corliss Skains, MD;  Location: [facility] OR;  Service: Thoracic;  Laterality: N/A;   ESOPHAGEAL STENT PLACEMENT N/A 06/28/2022   Procedure: ESOPHAGEAL STENT PLACEMENT;  Surgeon: Corliss Skains, MD;  Location: [facility] OR;  Service: Thoracic;  Laterality: N/A;   ESOPHAGEAL STENT PLACEMENT N/A 08/10/2022   Procedure: ESOPHAGEAL STENT REMOVAL;  Surgeon: Corliss Skains, MD;  Location: [facility] OR;  Service: Thoracic;  Laterality: N/A;   ESOPHAGECTOMY  04/05/2022   ESOPHAGOGASTRODUODENOSCOPY N/A 11/13/2021   Procedure: ESOPHAGOGASTRODUODENOSCOPY (EGD);  Surgeon: Beverley Fiedler, MD;  Location: [facility] ENDOSCOPY;  Service: Gastroenterology;  Laterality: N/A;   ESOPHAGOGASTRODUODENOSCOPY N/A 04/05/2022   Procedure: ESOPHAGOGASTRODUODENOSCOPY (EGD);  Surgeon: Corliss Skains, MD;  Location: [facility] OR;  Service: Thoracic;  Laterality: N/A;   ESOPHAGOGASTRODUODENOSCOPY N/A 05/09/2022   Procedure: ESOPHAGOGASTRODUODENOSCOPY (EGD);  Surgeon: Corliss Skains, MD;  Location: [facility] OR;  Service: Thoracic;  Laterality: N/A;   esophageal stent placement   ESOPHAGOGASTRODUODENOSCOPY N/A 06/27/2022   Procedure: ESOPHAGOGASTRODUODENOSCOPY (EGD);  Surgeon: Corliss Skains, MD;  Location: [facility] OR;  Service: Thoracic;  Laterality: N/A;   ESOPHAGOGASTRODUODENOSCOPY N/A 06/28/2022   Procedure: ESOPHAGOGASTRODUODENOSCOPY (EGD);  Surgeon: Corliss Skains, MD;  Location: [facility] OR;  Service: Thoracic;  Laterality: N/A;   ESOPHAGOGASTRODUODENOSCOPY N/A 08/10/2022   Procedure: ESOPHAGOGASTRODUODENOSCOPY (EGD);  Surgeon: Corliss Skains, MD;  Location: [facility] OR;  Service: Thoracic;  Laterality: N/A;   ESOPHAGOGASTRODUODENOSCOPY (EGD) WITH PROPOFOL N/A 12/14/2021   Procedure: ESOPHAGOGASTRODUODENOSCOPY (EGD) WITH PROPOFOL;  Surgeon: Lemar Lofty., MD;  Location: [facility] ENDOSCOPY;  Service: Gastroenterology;  Laterality: N/A;   EUS N/A 12/14/2021   Procedure: UPPER ENDOSCOPIC ULTRASOUND (EUS) LINEAR;  Surgeon: Lemar Lofty., MD;  Location: [facility] ENDOSCOPY;  Service: Gastroenterology;  Laterality: N/A;   FINE NEEDLE ASPIRATION  12/14/2021   Procedure: FINE NEEDLE ASPIRATION (FNA) LINEAR;  Surgeon: Lemar Lofty., MD;  Location: [facility] ENDOSCOPY;  Service: Gastroenterology;;   FOREIGN BODY REMOVAL  11/13/2021   Procedure: FOREIGN BODY REMOVAL;  Surgeon: Beverley Fiedler, MD;  Location: [facility] ENDOSCOPY;  Service: Gastroenterology;;   HUMERUS FRACTURE SURGERY Right    INTERCOSTAL NERVE BLOCK  04/05/2022   Procedure: INTERCOSTAL NERVE BLOCK;  Surgeon: Corliss Skains, MD;  Location: [facility] OR;  Service: Thoracic;;   IR IMAGING GUIDED PORT INSERTION  12/23/2021   IR REMOVAL TUN ACCESS W/ PORT W/O FL MOD SED  05/04/2023   IR REPLC DUODEN/JEJUNO TUBE PERCUT W/FLUORO  10/12/2022   IR THORACENTESIS ASP PLEURAL SPACE W/IMG GUIDE  04/19/2022   LAPAROSCOPIC JEJUNOSTOMY N/A 05/09/2022   Procedure: LAPAROSCOPIC JEJUNOSTOMY;  Surgeon: Corliss Skains, MD;  Location: [facility] OR;  Service: Thoracic;  Laterality: N/A;   MULTIPLE TOOTH  EXTRACTIONS  2015   NODE DISSECTION  04/05/2022   Procedure: NODE DISSECTION;  Surgeon: Corliss Skains, MD;  Location: [facility] OR;  Service: Thoracic;;   TONSILLECTOMY AND ADENOIDECTOMY     as a child   VIDEO ASSISTED THORACOSCOPY Right 05/09/2022   Procedure: VIDEO ASSISTED THORACOSCOPY;  Surgeon: Corliss Skains, MD;  Location: [facility] OR;  Service: Thoracic;  Laterality: Right;   VIDEO BRONCHOSCOPY WITH ENDOBRONCHIAL ULTRASOUND Bilateral 12/14/2021   Procedure: VIDEO BRONCHOSCOPY WITH ENDOBRONCHIAL ULTRASOUND;  Surgeon: Leslye Peer, MD;  Location: [facility] ENDOSCOPY;  Service: Cardiopulmonary;  Laterality: Bilateral;     A IV Location/Drains/Wounds Patient Lines/Drains/Airways Status     Active Line/Drains/Airways     Name Placement date Placement time Site Days   Peripheral IV 05/11/23 20 G Right Antecubital 05/11/23  1534  Antecubital  less than 1   Peripheral IV 05/11/23 20 G Anterior;Right Forearm 05/11/23  1535  Forearm  less than 1   Closed System Drain 1 Right Abdomen Bulb (JP) 19 Fr. 05/09/22  1021  Abdomen  367   Gastrostomy/Enterostomy Jejunostomy 16 Fr. LLQ 10/12/22  1010  LLQ  211   Pressure Injury 05/08/22 Buttocks Left Stage 2 -  Partial  thickness loss of dermis presenting as a shallow open injury with a red, pink wound bed without slough. looks like blister drained 05/08/22  1950  -- 368            Intake/Output Last 24 hours  Intake/Output Summary (Last 24 hours) at 05/11/2023 2334 Last data filed at 05/11/2023 1951 Gross per 24 hour  Intake 50 ml  Output --  Net 50 ml    Labs/Imaging Results for orders placed or performed during the hospital encounter of 05/11/23 (from the past 48 hour(s))  CBC     Status: None   Collection Time: 05/11/23  3:34 PM  Result Value Ref Range   WBC 10.1 4.0 - 10.5 K/uL   RBC 4.66 4.22 - 5.81 MIL/uL   Hemoglobin 13.6 13.0 - 17.0 g/dL   HCT 16.1 09.6 - 04.5 %   MCV 89.3 80.0 - 100.0 fL   MCH 29.2 26.0 - 34.0 pg   MCHC 32.7  30.0 - 36.0 g/dL   RDW 40.9 81.1 - 91.4 %   Platelets 188 150 - 400 K/uL   nRBC 0.0 0.0 - 0.2 %    Comment: Performed at [facility], [address], [place], [place] [address]  Troponin I (High Sensitivity)     Status: None   Collection Time: 05/11/23  3:34 PM  Result Value Ref Range   Troponin I (High Sensitivity) 6 <18 ng/L    Comment: (NOTE) Elevated high sensitivity troponin I (hsTnI) values and significant  changes across serial measurements may suggest ACS but many other  chronic and acute conditions are known to elevate hsTnI results.  Refer to the "Links" section for chest pain algorithms and additional  guidance. Performed at [facility], [address], [place], [place] [address]   Comprehensive metabolic panel     Status: Abnormal   Collection Time: 05/11/23  3:36 PM  Result Value Ref Range   Sodium 136 135 - 145 mmol/L   Potassium 3.3 (L) 3.5 - 5.1 mmol/L   Chloride 104 98 - 111 mmol/L   CO2 21 (L) 22 - 32 mmol/L   Glucose, Bld 124 (H) 70 - 99 mg/dL    Comment: Glucose reference range applies only to samples taken after fasting for at least 8 hours.   BUN 16 8 - 23 mg/dL   Creatinine, Ser 8.65 0.61 - 1.24 mg/dL   Calcium 9.3 8.9 - 78.4 mg/dL   Total Protein 6.5 6.5 - 8.1 g/dL   Albumin 3.7 3.5 - 5.0 g/dL   AST 22 15 - 41 U/L   ALT 23 0 - 44 U/L   Alkaline Phosphatase 101 38 - 126 U/L   Total Bilirubin 1.2 0.3 - 1.2 mg/dL   GFR, Estimated >69 >62 mL/min    Comment: (NOTE) Calculated using the CKD-EPI Creatinine Equation (2021)    Anion gap 11 5 - 15    Comment: Performed at [facility], [address] [place], [place] [address]  Lipase, blood     Status: None   Collection Time: 05/11/23  3:36 PM  Result Value Ref Range   Lipase 20 11 - 51 U/L    Comment: Performed at [facility], [address], [place], [place] [address]  I-stat chem 8, ED     Status: Abnormal   Collection Time: 05/11/23  3:55 PM  Result Value Ref Range    Sodium 138 135 - 145 mmol/L   Potassium  3.3 (L) 3.5 - 5.1 mmol/L   Chloride 103 98 - 111 mmol/L   BUN 15 8 - 23 mg/dL   Creatinine, Ser 1.61 0.61 - 1.24 mg/dL   Glucose, Bld 096 (H) 70 - 99 mg/dL    Comment: Glucose reference range applies only to samples taken after fasting for at least 8 hours.   Calcium, Ion 1.23 1.15 - 1.40 mmol/L   TCO2 21 (L) 22 - 32 mmol/L   Hemoglobin 14.6 13.0 - 17.0 g/dL   HCT 04.5 40.9 - 81.1 %  Troponin I (High Sensitivity)     Status: None   Collection Time: 05/11/23  6:50 PM  Result Value Ref Range   Troponin I (High Sensitivity) 7 <18 ng/L    Comment: (NOTE) Elevated high sensitivity troponin I (hsTnI) values and significant  changes across serial measurements may suggest ACS but many other  chronic and acute conditions are known to elevate hsTnI results.  Refer to the "Links" section for chest pain algorithms and additional  guidance. Performed at [facility], [address] [place], [place] [address]    CT Angio Chest/Abd/Pel for Dissection W and/or W/WO  Result Date: 05/11/2023 CLINICAL DATA:  Chest pain. Shortness of breath. Acute aortic syndrome suspected. History of esophageal cancer status post prior esophagectomy and gastric pull-through. EXAM: CT ANGIOGRAPHY CHEST, ABDOMEN AND PELVIS TECHNIQUE: Non-contrast CT of the chest was initially obtained. Multidetector CT imaging through the chest, abdomen and pelvis was performed using the standard protocol during bolus administration of intravenous contrast. Multiplanar reconstructed images and MIPs were obtained and reviewed to evaluate the vascular anatomy. RADIATION DOSE REDUCTION: This exam was performed according to the departmental dose-optimization program which includes automated exposure control, adjustment of the mA and/or kV according to patient size and/or use of iterative reconstruction technique. CONTRAST:  OMNIPAQUE IOHEXOL 350 MG/ML SOLN COMPARISON:  Chest radiograph dated  05/11/2023. chest CT dated 04/02/2023. FINDINGS: CTA CHEST FINDINGS Cardiovascular: There is no cardiomegaly. Trace pericardial effusion. Mild atherosclerotic calcification of the thoracic aorta. No aneurysmal dilatation or dissection. The origins of the great vessels of the aortic arch and the central pulmonary arteries appear patent. Mediastinum/Nodes: Multiple enlarged and partially calcified hilar and mediastinal lymph nodes, likely related to underlying granulomatous disease. Postsurgical changes of esophagectomy and gastric pull-through with retained ingested content. No mediastinal fluid collection. Lungs/Pleura: Right middle lobe subsegmental atelectasis. Pneumonia is not excluded. Small left pleural effusion. Several scattered bilateral pulmonary nodules measure up to 5 mm. Some nodules demonstrate punctate calcification, likely granuloma. There is no pneumothorax. The central airways are patent. Musculoskeletal: No acute osseous pathology. Right humeral fixation screws. Review of the MIP images confirms the above findings. CTA ABDOMEN AND PELVIS FINDINGS VASCULAR Aorta: Normal caliber aorta without aneurysm, dissection, vasculitis or significant stenosis. Celiac: Patent without evidence of aneurysm, dissection, vasculitis or significant stenosis. SMA: Patent without evidence of aneurysm, dissection, vasculitis or significant stenosis. Renals: Both renal arteries are patent without evidence of aneurysm, dissection, vasculitis, fibromuscular dysplasia or significant stenosis. IMA: Patent without evidence of aneurysm, dissection, vasculitis or significant stenosis. Inflow: Patent without evidence of aneurysm, dissection, vasculitis or significant stenosis. Veins: No obvious venous abnormality within the limitations of this arterial phase study. Review of the MIP images confirms the above findings. NON-VASCULAR No intra-abdominal free air or free fluid. Hepatobiliary: The liver is unremarkable. No biliary  dilatation. The gallbladder is unremarkable. Pancreas: Unremarkable. No pancreatic ductal dilatation or surrounding inflammatory changes. Spleen: Normal in size without focal  abnormality. Adrenals/Urinary Tract: The adrenal glands are unremarkable. Nonobstructing bilateral lower pole renal calculi measure up to 10 mm on the right. There is no hydronephrosis on either side. The visualized ureters and urinary bladder appear unremarkable. Stomach/Bowel: Left anterior ileostomy catheter. There is sigmoid diverticulosis and scattered colonic diverticula without active inflammatory changes. There is no bowel obstruction. No evidence of acute appendicitis. Lymphatic: No adenopathy. Reproductive: The prostate and seminal vesicles are grossly unremarkable. Other: Small fat containing right inguinal hernia. Musculoskeletal: Degenerative changes of the spine. No acute osseous pathology. Review of the MIP images confirms the above findings. IMPRESSION: 1. No acute intrathoracic, abdominal, or pelvic pathology. No aortic dissection or aneurysm. 2. Postsurgical changes of esophagectomy and gastric pull-through. 3. Similar appearance of hilar and mediastinal lymph nodes as well as pulmonary nodules. 4. Nonobstructing bilateral lower pole renal calculi. No hydronephrosis. 5. Colonic diverticulosis. No bowel obstruction. 6.  Aortic Atherosclerosis (ICD10-I70.0). Electronically Signed   By: Elgie Collard M.D.   On: 05/11/2023 18:38   DG Chest Portable 1 View  Result Date: 05/11/2023 CLINICAL DATA:  Chest pain.  History of esophageal cancer. EXAM: PORTABLE CHEST 1 VIEW COMPARISON:  Chest x-ray 08/10/2022.  Prior CT 04/02/2023. FINDINGS: Previous esophageal stent no longer identified. Patient has a more recent history of a esophagectomy and gastric pull-through with widening of the mediastinum today. Underinflation. No pneumothorax or effusion. No edema. The right inferior costophrenic angle is clipped off the edge of the film.  Overlapping cardiac leads. IMPRESSION: Previous history of a esophagectomy and gastric pull-through with widening of the mediastinum. Underinflation. Electronically Signed   By: Karen Kays M.D.   On: 05/11/2023 18:16    Pending Labs Unresulted Labs (From admission, onward)     Start     Ordered   05/12/23 0500  Basic metabolic panel  Tomorrow morning,   R        05/11/23 2232   05/12/23 0500  CBC  Tomorrow morning,   R        05/11/23 2232   05/12/23 0500  Magnesium  Tomorrow morning,   R        05/11/23 2309   05/11/23 2229  HIV Antibody (routine testing w rflx)  (HIV Antibody (Routine testing w reflex) panel)  Once,   R        05/11/23 2232            Vitals/Pain Today's Vitals   05/11/23 2100 05/11/23 2205 05/11/23 2209 05/11/23 2230  BP: 99/64  129/71 115/72  Pulse: 100 95 94 94  Resp: (!) 28 (!) 28 (!) 30 (!) 28  Temp:      TempSrc:      SpO2: 98% 96% 95% 97%  Weight:      Height:      PainSc:        Isolation Precautions No active isolations  Medications Medications  multivitamin with minerals tablet 1 tablet (has no administration in time range)  acetaminophen (TYLENOL) tablet 1,000 mg (1,000 mg Oral Not Given 05/11/23 2332)    Or  acetaminophen (TYLENOL) suppository 650 mg ( Rectal See Alternative 05/11/23 2332)  oxyCODONE (Oxy IR/ROXICODONE) immediate release tablet 5 mg (has no administration in time range)  melatonin tablet 5 mg (has no administration in time range)  pantoprazole (PROTONIX) EC tablet 40 mg (has no administration in time range)  docusate sodium (COLACE) capsule 100 mg (100 mg Oral Not Given 05/11/23 2332)  potassium chloride 10 mEq in 100 mL IVPB (  has no administration in time range)  sodium chloride 0.9 % bolus 1,000 mL (0 mLs Intravenous Stopped 05/11/23 1630)  pantoprazole (PROTONIX) injection 40 mg (40 mg Intravenous Given 05/11/23 1610)  iohexol (OMNIPAQUE) 350 MG/ML injection 100 mL (100 mLs Intravenous Contrast Given 05/11/23 1611)   HYDROmorphone (DILAUDID) injection 0.5 mg (0.5 mg Intravenous Given 05/11/23 1630)  HYDROmorphone (DILAUDID) injection 0.5 mg (0.5 mg Intravenous Given 05/11/23 1833)  sodium chloride 0.9 % bolus 500 mL (500 mLs Intravenous Bolus 05/11/23 1833)  famotidine (PEPCID) IVPB 20 mg premix (0 mg Intravenous Stopped 05/11/23 1951)    Mobility walks with person assist     Focused Assessments    R Recommendations: See Admitting Provider Note  Report given to:   Additional Notes:

## 2023-05-11 NOTE — ED Provider Notes (Signed)
[place] EMERGENCY DEPARTMENT AT [facility] Provider Note   CSN: [id number] Arrival date & time: 05/11/23  1513     History  Chief Complaint  Patient presents with   Chest Pain    Keith Moreno is a 62 y.o. male.  HPI Has history of esophageal adenocarcinoma.  He completed radiation and chemotherapy 7\2023.  This time it is under surveillance.  Patient just been cleared and had his port removed a week ago.  Patient reports after port removal he was not having any problems with chest pain, fever or shortness of breath.  Patient reports he has been having some problems with difficulty sleeping and sleeplessness.  His mother died a week ago.  However as of this morning he awakened with severe chest pain.  He reports he is got pain that is in the center of his chest that is burning and radiating up into his shoulders also in his central upper abdomen.  Patient reports he felt lightheaded most of the morning.  No syncopal episode.  He is felt short of breath.  He denies any lower extremity pain or swelling.  No known history of coronary artery disease.  Symptoms are worsened by mid afternoon.  EMS was called.  On EMS arrival patient reportedly was normotensive with systolic blood pressures in the 130s.  Nitroglycerin was tried.  Patient had significant drop in blood pressure down to 70 systolic.  He was given a 500 bolus of normal saline and pressure came up to 80s.  Patient reports he continues to have severe pain.  This did not improve pain.  He feels more lightheaded.  Denies that he takes any Viagra or erectile dysfunction medication.  He denies history of any similar type of chest pain.    Home Medications Prior to Admission medications   Medication Sig Start Date End Date Taking? Authorizing Provider  acetaminophen (TYLENOL) 500 MG tablet Take 2 tablets (1,000 mg total) by mouth every 6 (six) hours as needed for mild pain or fever. 08/11/22   Barrett, Rae Roam, PA-C  Alogliptin  Benzoate 12.5 MG TABS Take 12.5 mg by mouth daily.    [provider]  benzonatate (TESSALON) 100 MG capsule Take 100 mg by mouth 3 (three) times daily as needed for cough.    [provider]  carboxymethylcellulose (REFRESH PLUS) 0.5 % SOLN Place 1 drop into both eyes 2 (two) times daily as needed. 0.5 %    [provider]  guaiFENesin (ROBITUSSIN) 100 MG/5ML liquid Take 15 mLs by mouth every 4 (four) hours as needed for cough or to loosen phlegm. 08/11/22   Barrett, Rae Roam, PA-C  HYDROcodone-acetaminophen (NORCO) 7.5-325 MG tablet Take 1 tablet by mouth every 6 (six) hours as needed for moderate pain. 08/11/22   Barrett, Rae Roam, PA-C  lansoprazole (PREVACID) 30 MG capsule Take 1 capsule (30 mg total) by mouth 2 (two) times daily before a meal. 09/19/22 12/18/22  Lightfoot, Eliezer Lofts, MD  lisinopril (ZESTRIL) 40 MG tablet Take 40 mg by mouth daily.    [provider]  Nutritional Supplements (FEEDING SUPPLEMENT, OSMOLITE 1.5 CAL,) LIQD Place 1,710 mLs into feeding tube daily. Tube feedings to be run via J tube from 7 pm through 7 am nightly only 06/29/22   Doree Fudge M, PA-C  olopatadine (PATANOL) 0.1 % ophthalmic solution Place 1 drop into both eyes 2 (two) times daily. 10/28/21   [provider]  ondansetron (ZOFRAN-ODT) 8 MG disintegrating tablet Take 1 tablet (  8 mg total) by mouth every 8 (eight) hours as needed for nausea or vomiting. 12/15/21   Georga Kaufmann T, PA-C  pioglitazone (ACTOS) 30 MG tablet Take 30 mg by mouth daily.    [provider]  Sodium Chloride Flush (NORMAL SALINE FLUSH) 0.9 % SOLN 30 mLs by Per J Tube route every 8 (eight) hours. 05/31/22   Barrett, Erin R, PA-C  Zinc Oxide (TRIPLE PASTE) 12.8 % ointment Apply topically 2 (two) times daily. To J-Tube site Patient taking differently: Apply 1 Application topically as needed for irritation. To J-Tube site 04/28/22   Rowe Clack, PA-C      Allergies    Procaine,  Chocolate, and Other    Review of Systems   Review of Systems  Physical Exam Updated Vital Signs Pulse 88   Temp 98.3 F (36.8 C) (Oral)   Resp 20   Ht 6' (1.829 m)   Wt (!) 150 kg   SpO2 97%   BMI 44.85 kg/m  Physical Exam Constitutional:      Comments: With clear mental status.  No respiratory distress.  Patient appears to be in a lot of pain.  Pale in appearance.  HENT:     Head: Normocephalic and atraumatic.     Mouth/Throat:     Pharynx: Oropharynx is clear.  Eyes:     Extraocular Movements: Extraocular movements intact.  Cardiovascular:     Rate and Rhythm: Normal rate and regular rhythm.  Pulmonary:     Effort: Pulmonary effort is normal.     Breath sounds: Normal breath sounds.  Abdominal:     Comments: Patient endorses tenderness in the epigastrium and mid upper abdomen.  No guarding present.  Musculoskeletal:        General: No swelling or tenderness. Normal range of motion.     Right lower leg: No edema.     Left lower leg: No edema.  Skin:    General: Skin is warm and dry.     Coloration: Skin is pale.  Neurological:     General: No focal deficit present.     Mental Status: He is oriented to person, place, and time.     Motor: No weakness.     Coordination: Coordination normal.     ED Results / Procedures / Treatments   Labs (all labs ordered are listed, but only abnormal results are displayed) Labs Reviewed - No data to display  EKG EKG Interpretation Date/Time:  Friday May 11 2023 15:20:39 EDT Ventricular Rate:  84 PR Interval:    QRS Duration:  199 QT Interval:  412 QTC Calculation: 510 R Axis:   98  Text Interpretation: Atrial fibrillation Ventricular premature complex Right bundle branch block agree, Old RBBB. V1 ST T similar to I in prior tracing. QRS morphology differences suspected due to lead placement. Confirmed by Arby Barrette [phone]) on 05/11/2023 4:47:22 PM  Radiology No results found.  Procedures Procedures     Medications Ordered in ED Medications - No data to display  ED Course/ Medical Decision Making/ A&P                                 Medical Decision Making Amount and/or Complexity of Data Reviewed Labs: ordered. Radiology: ordered.  Risk Prescription drug management. Decision regarding hospitalization.  Patient presents with fairly abrupt onset of severe central chest pain radiating to the shoulders.  On arrival patient is pale  with hypotension.  Notably however he did have nitroglycerin administered by EMS and by report, was normotensive before administration of nitroglycerin.  Differential diagnosis includes hypotension secondary to vascular emergency versus nitroglycerin induced hypotension.  Patient's pain level and description is very concerning for ACS\dissection\PE.  Patient also has a known history of prior esophageal cancer, differential could include esophageal rupture or mass compression.  Patient's blood pressures had rebounded to the 70s by the time of arrival to the emergency department.  Will continue with an additional liter of volume resuscitation with normal saline.  Patient does not describe hematemesis or other suspicious blood loss.  At this time lower probability for hypovolemia secondary to abrupt volume loss.  Patient denies use of erectile dysfunction medications thus this should not be the precipitating cause for hypotension in the setting of nitroglycerin administration.  Will continue to monitor closely, provide volume resuscitation and pain control.  Will also empirically treat with Protonix given history of esophageal adenocarcinoma and possible esophageal erosion.  Portable chest x-ray reviewed by myself, cardiomegaly present.  Some possible mild vascular congestion or perihilar lymphadenopathy.  No apparent pneumothorax no large fluid collection no visible free air.  Patient's EKG reviewed by myself.  No apparent STEMI.  There is a broad S wave with  flattening in the anterior chest leads.  This appears likely to be lead placement and have lower suspicion for STEMI.  Will review with cardiology.  15: 48 I notified CT to proceed with dissection study without current GFR.  I have reviewed patient's history and he has had normal GFR with last test less than a month ago.  At this time with severe chest and abdominal pain and concern for possible dissection I feel it risk-benefit is in favor of proceeding expeditiously with CT scan.  16: 00 reviewed with Dr. Anne Fu.  Agrees to EKGs are consistent with older tracings.  No acute ischemic appearance at this time.  With chest pain including central chest pain radiating to shoulders and abdominal pain with hypotension will continue to proceed with evaluation including dissection study and troponins.  Returns from CT with systolic blood pressure 117/70.  Will administer Dilaudid half milligram and titrate as tolerated with careful monitoring blood pressures.  I have personally reviewed CT imaging of the dissection study.  Patient's esophagus appears to have significant distention and material within it.  Do not appreciate any large central or saddle PE.  No pneumothorax.  No evident dissection.  Will await radiology formal read.   Radiology review does not identify any acute pathology on the CT chest abdomen pelvis.  19: 05 patient reports he has improved pain since pain medications.  He still rates discomfort about a 3 out of 10.  Will add a dose of Pepcid as well.  20: 30 monitor shows sinus rhythm rate of 103.  Patient's pain is controlled but still rating about a 2-3 out of 10.  Consult: Reviewed with Dr. Leafy Ro call for cardiothoracic surgery.  We reviewed the patient's history of esophagectomy and pull-through a year ago and currently nondiagnostic CT finding without evidence of perforation.  He advises at this point a year out is extremely low probability for perforation if there is nothing visible  on CT at this time.  Gold standard would be a barium swallow.  We discussed plan of medical admission with pain control and reassessing in the morning to see if further diagnostic imaging needed.  Will plan for medical admission for pain control and observation.  Patient does remain mildly tachycardic.  Some of the EKG suggested atrial fibrillation but rate controlled.  Troponins have been normal.  Prior EKGs were similar although patient does not have a documented diagnosis of paroxysmal A-fib that I identified on the chart.  [facility]  21: 20 Family Medicine consulted for admission       Final Clinical Impression(s) / ED Diagnoses Final diagnoses:  Chest pain, unspecified type  Other specified hypotension  Prolonged Q-T interval on ECG  Atrial fibrillation, unspecified type [facility])    Rx / DC Orders ED Discharge Orders     None         Arby Barrette, MD 05/18/23 1448

## 2023-05-12 ENCOUNTER — Observation Stay (HOSPITAL_BASED_OUTPATIENT_CLINIC_OR_DEPARTMENT_OTHER): Payer: No Typology Code available for payment source

## 2023-05-12 ENCOUNTER — Observation Stay (HOSPITAL_COMMUNITY): Payer: No Typology Code available for payment source

## 2023-05-12 DIAGNOSIS — R0789 Other chest pain: Secondary | ICD-10-CM

## 2023-05-12 DIAGNOSIS — I959 Hypotension, unspecified: Secondary | ICD-10-CM | POA: Insufficient documentation

## 2023-05-12 DIAGNOSIS — R9431 Abnormal electrocardiogram [ECG] [EKG]: Secondary | ICD-10-CM | POA: Insufficient documentation

## 2023-05-12 DIAGNOSIS — I4891 Unspecified atrial fibrillation: Secondary | ICD-10-CM

## 2023-05-12 DIAGNOSIS — R079 Chest pain, unspecified: Secondary | ICD-10-CM | POA: Diagnosis not present

## 2023-05-12 LAB — ECHOCARDIOGRAM COMPLETE
Est EF: 55
Height: 72 in
S' Lateral: 2.8 cm
Weight: 5185.22 [oz_av]

## 2023-05-12 LAB — GLUCOSE, CAPILLARY
Glucose-Capillary: 102 mg/dL — ABNORMAL HIGH (ref 70–99)
Glucose-Capillary: 105 mg/dL — ABNORMAL HIGH (ref 70–99)
Glucose-Capillary: 117 mg/dL — ABNORMAL HIGH (ref 70–99)
Glucose-Capillary: 90 mg/dL (ref 70–99)
Glucose-Capillary: 91 mg/dL (ref 70–99)

## 2023-05-12 LAB — BASIC METABOLIC PANEL
Anion gap: 10 (ref 5–15)
BUN: 12 mg/dL (ref 8–23)
CO2: 18 mmol/L — ABNORMAL LOW (ref 22–32)
Calcium: 9 mg/dL (ref 8.9–10.3)
Chloride: 109 mmol/L (ref 98–111)
Creatinine, Ser: 0.88 mg/dL (ref 0.61–1.24)
GFR, Estimated: >60 mL/min (ref >60)
Glucose, Bld: 121 mg/dL — ABNORMAL HIGH (ref 70–99)
Potassium: 3.9 mmol/L (ref 3.5–5.1)
Sodium: 137 mmol/L (ref 135–145)

## 2023-05-12 LAB — CBC
HCT: 40.5 % (ref 39.0–52.0)
Hemoglobin: 13.6 g/dL (ref 13.0–17.0)
MCH: 29.2 pg (ref 26.0–34.0)
MCHC: 33.6 g/dL (ref 30.0–36.0)
MCV: 86.9 fL (ref 80.0–100.0)
Platelets: 171 10*3/uL (ref 150–400)
RBC: 4.66 MIL/uL (ref 4.22–5.81)
RDW: 14.4 % (ref 11.5–15.5)
WBC: 11 10*3/uL — ABNORMAL HIGH (ref 4.0–10.5)
nRBC: 0 % (ref 0.0–0.2)

## 2023-05-12 LAB — MAGNESIUM: Magnesium: 1.7 mg/dL (ref 1.7–2.4)

## 2023-05-12 LAB — HIV ANTIBODY (ROUTINE TESTING W REFLEX): HIV Screen 4th Generation wRfx: NONREACTIVE

## 2023-05-12 MED ORDER — GUAIFENESIN-DM 100-10 MG/5ML PO SYRP
5.0000 mL | ORAL_SOLUTION | ORAL | Status: DC | PRN
  Administered 2023-05-12: 5 mL via ORAL
  Filled 2023-05-12: qty 5

## 2023-05-12 MED ORDER — OSMOLITE 1.5 CAL PO LIQD
1000.0000 mL | ORAL | Status: DC
  Administered 2023-05-12 – 2023-05-13 (×5): 1000 mL
  Filled 2023-05-12 (×6): qty 1000

## 2023-05-12 MED ORDER — ORAL CARE MOUTH RINSE: 15.0000 mL | OROMUCOSAL | Status: DC | PRN

## 2023-05-12 MED ORDER — PROSOURCE TF20 ENFIT COMPATIBL EN LIQD
60.0000 mL | Freq: Every day | ENTERAL | Status: DC
  Administered 2023-05-13 – 2023-05-15 (×3): 60 mL
  Filled 2023-05-12 (×3): qty 60

## 2023-05-12 MED ORDER — ENOXAPARIN SODIUM 40 MG/0.4ML IJ SOSY
40.0000 mg | PREFILLED_SYRINGE | Freq: Every day | INTRAMUSCULAR | Status: DC
  Administered 2023-05-12: 40 mg via SUBCUTANEOUS
  Filled 2023-05-12: qty 0.4

## 2023-05-12 MED ORDER — ENOXAPARIN SODIUM 80 MG/0.8ML IJ SOSY
70.0000 mg | PREFILLED_SYRINGE | Freq: Every day | INTRAMUSCULAR | Status: DC
  Administered 2023-05-13 – 2023-05-15 (×3): 70 mg via SUBCUTANEOUS
  Filled 2023-05-12 (×3): qty 0.8

## 2023-05-12 MED ORDER — MAGNESIUM SULFATE 2 GM/50ML IV SOLN
2.0000 g | Freq: Once | INTRAVENOUS | Status: AC
  Administered 2023-05-12: 2 g via INTRAVENOUS
  Filled 2023-05-12: qty 50

## 2023-05-12 MED ORDER — SODIUM CHLORIDE 0.9 % IV BOLUS
1000.0000 mL | Freq: Once | INTRAVENOUS | Status: AC
  Administered 2023-05-12: 1000 mL via INTRAVENOUS

## 2023-05-12 MED ORDER — OSMOLITE 1.2 CAL PO LIQD: 1000.0000 mL | ORAL | Status: DC

## 2023-05-12 MED ORDER — PANTOPRAZOLE SODIUM 40 MG IV SOLR
40.0000 mg | Freq: Two times a day (BID) | INTRAVENOUS | Status: DC
  Administered 2023-05-12 – 2023-05-14 (×5): 40 mg via INTRAVENOUS
  Filled 2023-05-12 (×5): qty 10

## 2023-05-12 MED ORDER — IOHEXOL 300 MG/ML  SOLN
100.0000 mL | Freq: Once | INTRAMUSCULAR | Status: AC | PRN
  Administered 2023-05-12: 60 mL via ORAL

## 2023-05-12 MED ORDER — FREE WATER
120.0000 mL | Status: DC
  Administered 2023-05-12 – 2023-05-15 (×18): 120 mL

## 2023-05-12 NOTE — Plan of Care (Signed)

## 2023-05-12 NOTE — Progress Notes (Signed)
SLP Cancellation Note  Patient Details Name: Keith Moreno MRN: [id number] DOB: [birth year]   Cancelled treatment:        Orders received and appreciated.  Spoke with treatment team.  Pt has hx esophagectomy 2023. There is concern for an esophageal leak, in addition to concern for aspiration pneumonia.  MBSS is not diagnostic for esophagus.  Recommend barium swallow esophagram first to assess for leak.  SLP will hold evaluation pending those results.  If no leak is present, we can proceed with work up for oropharyngeal swallow function.   Kerrie Pleasure, MA, CCC-SLP Acute Rehabilitation Services Office: [phone] 05/12/2023, 8:17 AM

## 2023-05-12 NOTE — Progress Notes (Addendum)
Initial Nutrition Assessment  DOCUMENTATION CODES:   Morbid obesity  INTERVENTION:   -Resume TF after procedures today:   Initiate Osmolite 1.5 @ 20 ml/hr via j-tube and increase by 10 ml every 4 hours to goal rate of 70 ml/hr.   60 ml Prosource TF daily.    120 ml free water flush every 4 hours  Tube feeding regimen provides 2600 kcal (100% of needs), 125 grams of protein, and 1280 ml of H2O. Total free water: 2000 ml daily  NUTRITION DIAGNOSIS:   Increased nutrient needs related to chronic illness (esophageal carcinoma s/p esophagectomy) as evidenced by estimated needs.  GOAL:   Patient will meet greater than or equal to 90% of their needs  MONITOR:   TF tolerance, PO intake, Diet advancement  REASON FOR ASSESSMENT:   Consult Assessment of nutrition requirement/status  ASSESSMENT:   Pt with PMH HTN, esophageal adenocarcinoma s/p esophagectomy 2023, diabetes, HFmrEF, anxiety presenting with chest pain and hypotension.  Pt admitted with chest pain, hypotension, and a-fib.   Reviewed I/O's: +50 ml x 24 hours  Pt unavailable at time of visit. Attempted to speak with pt via call to hospital room phone, however, unable to reach. RD unable to obtain further nutrition-related history or complete nutrition-focused physical exam at this time.     Per SLP notes, pt with history of esophagectomy in 2023. Currently, there is concern for esophageal leak and aspiration pneumonia. Plan for barium esophagram to assess for leak. SLP will evaluate pending results.   Per H&P, pt table to take oral intake at baseline and also receive nutrition via j-tube. Pt has mainly been using water via j-tube due to difficulty obtaining Osmolite TF over the past 2 weeks. Per previous RD notes, discharge TF regimen was Osmolite 1.5 @ 95 ml/hr x 18 hours and 200 ml free water flush every 4 hours, which provides 2565 kcals, 107 grams protein, and 2500 ml free water daily. Pt is followed by outpatient  cancer center RD and RD at [facility].   RD reached out to MD regarding timing of resuming j-tube feeds; can resume after esophagram and MDS. Also spoke with RN regarding TF orders and made her aware. RD to provide recommendations; will do continuous feeds in the hospital (especially if pt needs to remain NPO for leak or other swallowing difficulties).   Reviewed wt hx; wt has been stable over the past 9 months.   Obesity is a complex, chronic medical condition that is optimally managed by a multidisciplinary care team. Weight loss is not an ideal goal for an acute inpatient hospitalization. However, if further work-up for obesity is warranted, consider outpatient referral to [place] [facility].    Medications reviewed and include colace and protonix.   Lab Results  Component Value Date   HGBA1C 5.1 04/03/2022   PTA DM medications are 30 mg actos daily and 50 mg [place] daily.   Labs reviewed: CBGS: 214 (inpatient orders for glycemic control are 0-9 units insulin aspart TID with meals).    Diet Order:   Diet Order             Diet NPO time specified  Diet effective now                   EDUCATION NEEDS:   No education needs have been identified at this time  Skin:  Skin Assessment: Reviewed RN Assessment  Last BM:  05/10/23  Height:   Ht Readings  from Last 1 Encounters:  05/11/23 6' (1.829 m)    Weight:   Wt Readings from Last 1 Encounters:  05/12/23 (!) 147 kg    Ideal Body Weight:  80.9 kg  BMI:  Body mass index is 43.95 kg/m.  Estimated Nutritional Needs:   Kcal:  2400-2600  Protein:  120-135 grams  Fluid:  > 2 L    Levada Schilling, RD, LDN, CDCES Registered Dietitian III Certified Diabetes Care and Education Specialist Please refer to [facility] for RD and/or RD on-call/weekend/after hours pager

## 2023-05-12 NOTE — Plan of Care (Signed)

## 2023-05-12 NOTE — Assessment & Plan Note (Signed)
SBP as low as 70s as reported by EMS following nitroglycerin administration. Has improved with IV fluid boluses to 100s-120s/70s-80s. Does have hx of hypotension per chart review. --hold home amlodipine 10mg  nightly, will add back as appropriate --continue to monitor vitals per unit

## 2023-05-12 NOTE — Assessment & Plan Note (Addendum)
Sharp, tight central chest pain with shallow breathing and radiation to the back. EKG with evidence of possible Afib, pt has not been diagnosed with this previously but had episode of Afib noted during hospital stay in 2023 and prior EKGs were similar. CT angio chest/abd/pelvis significant for R middle lobe atelectasis which could be developing PNA. Pt has hx of esophageal adenocarcinoma s/p esophagectomy in 2023. Suspect possible esophageal leak vs GERD vs aspiration pneumonia. --admit to FMTS, med-tele, Dr McDiarmid attending --tylenol 1000 mg q6hr scheduled, K-pad for mild pain --oxycodone 5mg  q4hr PRN for severe pain --protonix 40mg  BID  --CT surgery curbsided by ED provider, recommend observation and barium swallow in AM to assess for esophageal perforation and  --continuous cardiac monitoring --Echocardiogram in AM --SLP and RD eval and treat --AM BMP, CBC, Mg

## 2023-05-12 NOTE — Assessment & Plan Note (Addendum)
532 on EKG, manually calculated to 528 --f/u repeat EKG as above --avoid QT prolonging medications

## 2023-05-12 NOTE — Assessment & Plan Note (Deleted)
SBP as low as 70s as reported by EMS following nitroglycerin administration. Has improved with IV fluid boluses to 100s-120s/70s-80s. Does have hx of hypotension per chart review. --hold home amlodipine 10mg  nightly, will add back as appropriate --continue to monitor vitals per unit

## 2023-05-12 NOTE — Assessment & Plan Note (Addendum)
No known diagnosis of paroxysmal atrial fibrillation but has had history of afib after surgery in past with chart review. NSR on exam. CHADSVASC of 3, not on any anticoagulation. -Consider holter monitor at discharge -Consider cardiology consult/follow up -Keep K>4, Mag>2

## 2023-05-12 NOTE — Progress Notes (Signed)
  Echocardiogram 2D Echocardiogram has been performed.  Delcie Roch 05/12/2023, 9:18 AM

## 2023-05-12 NOTE — Progress Notes (Signed)
Daily Progress Note Intern Pager: [phone]  Patient name: Keith Moreno Medical record number: [id number] Date of birth: 25-Apr-1961 Age: 62 y.o. Gender: male  Primary Care Provider: [facility] Consultants: CT surgery Code Status: full code  Pt Overview and Major Events to Date:  11/1: admitted to FMTS  Assessment and Plan: Keith Moreno is a 62 year old male presenting with chest pain and hypotension. Differential remains broad, including cardiac arrhythmia with Afib on initial EKG, esophageal rupture in pt with esophagectomy in 2023, GERD, or aspiration pneumonia with R middle lobe atelectasis on imaging. Initial hypotension likely due to nitroglycerin administration, now normotensive.   Pertinent PMH/PSH includes esophageal adenocarcinoma s/p esophagectomy in 2023.  Assessment & Plan Chest pain Sharp, tight central chest pain with shallow breathing and radiation to the back. EKG with evidence of possible Afib, pt has not been diagnosed with this previously but had episode of Afib noted during hospital stay in 2023 and prior EKGs were similar. CT angio chest/abd/pelvis significant for R middle lobe atelectasis which could be developing PNA. Pt has hx of esophageal adenocarcinoma s/p esophagectomy in 2023. Suspect possible esophageal leak vs GERD vs aspiration pneumonia. AM labs with slight leukocytosis to 11, otherwise WNL. --f/u repeat EKG --tylenol 1000 mg q6hr scheduled, K-pad for mild pain --oxycodone 5mg  q4hr PRN for severe pain --protonix 40mg  BID  --CT surgery curbsided by ED provider, recommend observation and barium swallow in AM to assess for esophageal perforation --continuous cardiac monitoring --Echocardiogram in AM --SLP and RD eval and treat Hypotension SBP as low as 70s as reported by EMS following nitroglycerin administration. Has improved with IV fluid boluses to 100s-120s/70s-80s. Does have hx of hypotension per chart review. --continue to  hold home amlodipine 10mg  nightly, will add back as appropriate --continue to monitor vitals per unit Atrial fibrillation (HCC) No known diagnosis of paroxysmal atrial fibrillation but has had history of afib after surgery in past with chart review. NSR on exam. CHADSVASC of 3, not on any anticoagulation. -Consider holter monitor at discharge -Consider cardiology consult/follow up -Keep K>4, Mag>2 -AM BMP Prolonged Q-T interval on ECG 532 on EKG, manually calculated to 528 --f/u repeat EKG as above --avoid QT prolonging medications   Chronic and Stable Issues: Constipation: colace 50mg  nightly PTSD/insomina: melatonin 5 mg nightly (reportedly takes 50mg  nightly at home), consider prazosin  T2DM: holding home pioglitazone 30mg  and sitagliptin 50mg  daily; sSSI and CBGs AC/HS GAD: Continue sertraline 25 mg daily  FEN/GI: NPO for barium swallow PPx: lovenox Dispo: pending clinical improvement  Subjective:  Pt states he feels fine this morning. Says chest pain is minimal, denies any other symptoms. Reviewed plan to repeat EKG and get echo and barium swallow today. Pt and wife at bedside deny further questions or concerns.  Objective: Temp:  [98.3 F (36.8 C)-98.7 F (37.1 C)] 98.7 F (37.1 C) (11/02 0000) Pulse Rate:  [86-104] 93 (11/01 2345) Resp:  [19-46] 20 (11/02 0000) BP: (79-129)/(53-80) 122/78 (11/02 0000) SpO2:  [91 %-99 %] 95 % (11/01 2345) Weight:  [150 kg] 150 kg (11/01 1527) Physical Exam: General: older male resting in bed, pleasant and conversant, in NAD Cardiovascular: RRR, normal S1/S2 Respiratory: CTAB to anterior lung fields, normal WOB on RA Abdomen: normoactive bowel sounds, mildly tender to palpation across all quadrants, J-tube site covered with clean, dry dressing Extremities: no edema to BLE  Laboratory: Most recent CBC Lab Results  Component Value Date   WBC 11.0 (H) 05/12/2023   HGB  13.6 05/12/2023   HCT 40.5 05/12/2023   MCV 86.9 05/12/2023    PLT 171 05/12/2023   Most recent BMP    Latest Ref Rng & Units 05/12/2023    2:32 AM  BMP  Glucose 70 - 99 mg/dL 952   BUN 8 - 23 mg/dL 12   Creatinine 8.41 - 1.24 mg/dL 3.24   Sodium 401 - 027 mmol/L 137   Potassium 3.5 - 5.1 mmol/L 3.9   Chloride 98 - 111 mmol/L 109   CO2 22 - 32 mmol/L 18   Calcium 8.9 - 10.3 mg/dL 9.0     Mg 1.7  Lorayne Bender, MD 05/12/2023, 3:40 AM  PGY-1, [facility] FPTS Intern pager: [phone], text pages welcome Secure chat group [facility] Teaching Service

## 2023-05-12 NOTE — Assessment & Plan Note (Deleted)
532 on EKG, manually calculated to 528 --repeat EKG in AM --avoid QT prolonging medications

## 2023-05-12 NOTE — Assessment & Plan Note (Signed)
No known diagnosis of paroxysmal atrial fibrillation but has had history of afib after surgery in past with chart review. NSR on exam. CHADSVASC of 3, not on any anticoagulation. -Consider holter monitor at discharge -Consider cardiology consult/follow up -Keep K>4, Mag>2 -AM BMP

## 2023-05-12 NOTE — Assessment & Plan Note (Signed)
532 on EKG, manually calculated to 528 --repeat EKG in AM --avoid QT prolonging medications

## 2023-05-12 NOTE — Assessment & Plan Note (Addendum)
Sharp, tight central chest pain with shallow breathing and radiation to the back. EKG with evidence of possible Afib, pt has not been diagnosed with this previously but had episode of Afib noted during hospital stay in 2023 and prior EKGs were similar. CT angio chest/abd/pelvis significant for R middle lobe atelectasis which could be developing PNA. Pt has hx of esophageal adenocarcinoma s/p esophagectomy in 2023. Suspect possible esophageal leak vs GERD vs aspiration pneumonia. AM labs with slight leukocytosis to 11, otherwise WNL. --f/u repeat EKG --tylenol 1000 mg q6hr scheduled, K-pad for mild pain --oxycodone 5mg  q4hr PRN for severe pain --protonix 40mg  BID  --CT surgery curbsided by ED provider, recommend observation and barium swallow in AM to assess for esophageal perforation --continuous cardiac monitoring --Echocardiogram in AM --SLP and RD eval and treat

## 2023-05-12 NOTE — Assessment & Plan Note (Signed)
SBP as low as 70s as reported by EMS following nitroglycerin administration. Has improved with IV fluid boluses to 100s-120s/70s-80s. Does have hx of hypotension per chart review. --continue to hold home amlodipine 10mg  nightly, will add back as appropriate --continue to monitor vitals per unit

## 2023-05-12 NOTE — Evaluation (Signed)
Clinical/Bedside Swallow Evaluation Patient Details  Name: Keith Moreno MRN: [id number] Date of Birth: 1961-06-13  Today's Date: 05/12/2023 Time: SLP Start Time (ACUTE ONLY): 1705 SLP Stop Time (ACUTE ONLY): 1720 SLP Time Calculation (min) (ACUTE ONLY): 15 min  Past Medical History:  Past Medical History:  Diagnosis Date   Anxiety    Cancer (HCC)    Esophageal Cancer   COVID 2022   mild case   Dyspnea    r/t chemo and radiation   Headache    History of kidney stones    Hypertension    Kidney infection    at age 71   Pneumonia    62 years old   Pre-diabetes    PTSD (post-traumatic stress disorder)    per pt, if woken up suddenly he "cocks back arm" as if to punch but usually wakes up enough to come to before he hits anyone   Past Surgical History:  Past Surgical History:  Procedure Laterality Date   BIOPSY  11/13/2021   Procedure: BIOPSY;  Surgeon: Beverley Fiedler, MD;  Location: [facility] ENDOSCOPY;  Service: Gastroenterology;;   BIOPSY  12/14/2021   Procedure: BIOPSY;  Surgeon: Lemar Lofty., MD;  Location: [facility] ENDOSCOPY;  Service: Gastroenterology;;   BRONCHIAL BRUSHINGS  12/14/2021   Procedure: BRONCHIAL BRUSHINGS;  Surgeon: Leslye Peer, MD;  Location: [facility] ENDOSCOPY;  Service: Cardiopulmonary;;   BRONCHIAL NEEDLE ASPIRATION BIOPSY  12/14/2021   Procedure: BRONCHIAL NEEDLE ASPIRATION BIOPSIES;  Surgeon: Leslye Peer, MD;  Location: [facility] ENDOSCOPY;  Service: Cardiopulmonary;;   COLONOSCOPY  2018   CYST EXCISION  2022   left side of head   ESOPHAGEAL STENT PLACEMENT N/A 06/27/2022   Procedure: ESOPHAGEAL STENT REMOVAL;  Surgeon: Corliss Skains, MD;  Location: [facility] OR;  Service: Thoracic;  Laterality: N/A;   ESOPHAGEAL STENT PLACEMENT N/A 06/28/2022   Procedure: ESOPHAGEAL STENT PLACEMENT;  Surgeon: Corliss Skains, MD;  Location: [facility] OR;  Service: Thoracic;  Laterality: N/A;   ESOPHAGEAL STENT PLACEMENT N/A 08/10/2022   Procedure: ESOPHAGEAL STENT REMOVAL;   Surgeon: Corliss Skains, MD;  Location: [facility] OR;  Service: Thoracic;  Laterality: N/A;   ESOPHAGECTOMY  04/05/2022   ESOPHAGOGASTRODUODENOSCOPY N/A 11/13/2021   Procedure: ESOPHAGOGASTRODUODENOSCOPY (EGD);  Surgeon: Beverley Fiedler, MD;  Location: [facility] ENDOSCOPY;  Service: Gastroenterology;  Laterality: N/A;   ESOPHAGOGASTRODUODENOSCOPY N/A 04/05/2022   Procedure: ESOPHAGOGASTRODUODENOSCOPY (EGD);  Surgeon: Corliss Skains, MD;  Location: [facility] OR;  Service: Thoracic;  Laterality: N/A;   ESOPHAGOGASTRODUODENOSCOPY N/A 05/09/2022   Procedure: ESOPHAGOGASTRODUODENOSCOPY (EGD);  Surgeon: Corliss Skains, MD;  Location: [facility] OR;  Service: Thoracic;  Laterality: N/A;  esophageal stent placement   ESOPHAGOGASTRODUODENOSCOPY N/A 06/27/2022   Procedure: ESOPHAGOGASTRODUODENOSCOPY (EGD);  Surgeon: Corliss Skains, MD;  Location: [facility] OR;  Service: Thoracic;  Laterality: N/A;   ESOPHAGOGASTRODUODENOSCOPY N/A 06/28/2022   Procedure: ESOPHAGOGASTRODUODENOSCOPY (EGD);  Surgeon: Corliss Skains, MD;  Location: [facility] OR;  Service: Thoracic;  Laterality: N/A;   ESOPHAGOGASTRODUODENOSCOPY N/A 08/10/2022   Procedure: ESOPHAGOGASTRODUODENOSCOPY (EGD);  Surgeon: Corliss Skains, MD;  Location: [facility] OR;  Service: Thoracic;  Laterality: N/A;   ESOPHAGOGASTRODUODENOSCOPY (EGD) WITH PROPOFOL N/A 12/14/2021   Procedure: ESOPHAGOGASTRODUODENOSCOPY (EGD) WITH PROPOFOL;  Surgeon: Lemar Lofty., MD;  Location: [facility] ENDOSCOPY;  Service: Gastroenterology;  Laterality: N/A;   EUS N/A 12/14/2021   Procedure: UPPER ENDOSCOPIC ULTRASOUND (EUS) LINEAR;  Surgeon: Lemar Lofty., MD;  Location: [facility] ENDOSCOPY;  Service: Gastroenterology;  Laterality: N/A;   FINE NEEDLE ASPIRATION  12/14/2021   Procedure: FINE NEEDLE ASPIRATION (FNA) LINEAR;  Surgeon: Lemar Lofty., MD;  Location: [facility] ENDOSCOPY;  Service: Gastroenterology;;   FOREIGN BODY REMOVAL  11/13/2021   Procedure: FOREIGN BODY REMOVAL;   Surgeon: Beverley Fiedler, MD;  Location: [facility] ENDOSCOPY;  Service: Gastroenterology;;   HUMERUS FRACTURE SURGERY Right    INTERCOSTAL NERVE BLOCK  04/05/2022   Procedure: INTERCOSTAL NERVE BLOCK;  Surgeon: Corliss Skains, MD;  Location: [facility] OR;  Service: Thoracic;;   IR IMAGING GUIDED PORT INSERTION  12/23/2021   IR REMOVAL TUN ACCESS W/ PORT W/O FL MOD SED  05/04/2023   IR REPLC DUODEN/JEJUNO TUBE PERCUT W/FLUORO  10/12/2022   IR THORACENTESIS ASP PLEURAL SPACE W/IMG GUIDE  04/19/2022   LAPAROSCOPIC JEJUNOSTOMY N/A 05/09/2022   Procedure: LAPAROSCOPIC JEJUNOSTOMY;  Surgeon: Corliss Skains, MD;  Location: [facility] OR;  Service: Thoracic;  Laterality: N/A;   MULTIPLE TOOTH EXTRACTIONS  2015   NODE DISSECTION  04/05/2022   Procedure: NODE DISSECTION;  Surgeon: Corliss Skains, MD;  Location: [facility] OR;  Service: Thoracic;;   TONSILLECTOMY AND ADENOIDECTOMY     as a child   VIDEO ASSISTED THORACOSCOPY Right 05/09/2022   Procedure: VIDEO ASSISTED THORACOSCOPY;  Surgeon: Corliss Skains, MD;  Location: [facility] OR;  Service: Thoracic;  Laterality: Right;   VIDEO BRONCHOSCOPY WITH ENDOBRONCHIAL ULTRASOUND Bilateral 12/14/2021   Procedure: VIDEO BRONCHOSCOPY WITH ENDOBRONCHIAL ULTRASOUND;  Surgeon: Leslye Peer, MD;  Location: [facility] ENDOSCOPY;  Service: Cardiopulmonary;  Laterality: Bilateral;   HPI:  Patient is a 62 y.o. male with PMH: GERD, esophagectomy in 2023, hsa J-tube, dyspnea, HTN, pre-DM, PTSD. He presented to the hospital on 05/11/2023 with sudden onset SOB and chest pain. Per EMS report, patient with decreased oxygen saturations and was placed on NRB. Esophagram completed 11/2 which did r/o esophageal perforation/leak. 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    Assessment / Plan / Recommendation  Clinical Impression  Patient is presenting with clinical s/s of  dysphagia as per this bedside swallow evaluation. He reports that his swallowing is the same as it has been and he does fine with solids but with liquids that are thin in viscosity, he has difficulty and will cough. He thinks this is from him swallowing air with the liquids, feeling that he has to belch, but as he has had an esophagectomy this is difficult. SLP assessed his swallow function with ice chips, resulting in a mildly delayed cough response. Patient is in agreement to continue NPO status and proceed with MBS next date. (if can be scheduled with radiology department). In the meantime, patient may have ice chips. SLP Visit Diagnosis: Dysphagia, unspecified (R13.10)    Aspiration Risk  Mild aspiration risk    Diet Recommendation NPO;Ice chips PRN after oral care    Medication Administration: Via alternative means Supervision: Patient able to self feed Postural Changes: Seated upright at 90 degrees    Other  Recommendations Oral Care Recommendations: Oral care BID;Patient independent with oral care    Recommendations for follow up therapy are one component of a multi-disciplinary discharge planning process, led by the attending physician.  Recommendations may be updated based on patient status, additional functional criteria and insurance authorization.  Follow up Recommendations Other (comment) (TBD)      Assistance Recommended at Discharge    Functional Status Assessment Patient has had a recent decline in their  functional status and demonstrates the ability to make significant improvements in function in a reasonable and predictable amount of time.  Frequency and Duration min 2x/week  1 week       Prognosis Prognosis for improved oropharyngeal function: Good      Swallow Study   General Date of Onset: 05/11/23 HPI: Patient is a 62 y.o. male with PMH: GERD, esophagectomy in 2023, hsa J-tube, dyspnea, HTN, pre-DM, PTSD. He presented to the hospital on 05/11/2023 with sudden  onset SOB and chest pain. Per EMS report, patient with decreased oxygen saturations and was placed on NRB. Esophagram completed 11/2 which did r/o esophageal perforation/leak. 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 Type of Study: Bedside Swallow Evaluation Previous Swallow Assessment: OP SLP evalution in April 2024, MBS May 2024 Diet Prior to this Study: NPO Temperature Spikes Noted: No Respiratory Status: Room air History of Recent Intubation: No Behavior/Cognition: Alert;Cooperative;Pleasant mood Oral Cavity Assessment: Within Functional Limits Oral Care Completed by SLP: No Oral Cavity - Dentition: Missing dentition Vision: Functional for self-feeding Self-Feeding Abilities: Able to feed self Patient Positioning: Upright in bed Baseline Vocal Quality: Normal Volitional Swallow: Able to elicit    Oral/Motor/Sensory Function Overall Oral Motor/Sensory Function: Within functional limits   Ice Chips Ice chips: Impaired Presentation: Self Fed;Spoon Pharyngeal Phase Impairments: Throat Clearing - Delayed   Thin Liquid Thin Liquid: Not tested    Nectar Thick Nectar Thick Liquid: Not tested   Honey Thick Honey Thick Liquid: Not tested   Puree Puree: Not tested   Solid     Solid: Not tested     Angela Nevin, MA, CCC-SLP Speech Therapy

## 2023-05-13 ENCOUNTER — Observation Stay (HOSPITAL_COMMUNITY): Payer: No Typology Code available for payment source

## 2023-05-13 DIAGNOSIS — Z8501 Personal history of malignant neoplasm of esophagus: Secondary | ICD-10-CM | POA: Diagnosis not present

## 2023-05-13 DIAGNOSIS — I959 Hypotension, unspecified: Secondary | ICD-10-CM | POA: Diagnosis not present

## 2023-05-13 DIAGNOSIS — I4891 Unspecified atrial fibrillation: Secondary | ICD-10-CM | POA: Diagnosis not present

## 2023-05-13 DIAGNOSIS — R079 Chest pain, unspecified: Principal | ICD-10-CM

## 2023-05-13 DIAGNOSIS — I110 Hypertensive heart disease with heart failure: Secondary | ICD-10-CM | POA: Diagnosis not present

## 2023-05-13 LAB — GLUCOSE, CAPILLARY
Glucose-Capillary: 125 mg/dL — ABNORMAL HIGH (ref 70–99)
Glucose-Capillary: 115 mg/dL — ABNORMAL HIGH (ref 70–99)
Glucose-Capillary: 113 mg/dL — ABNORMAL HIGH (ref 70–99)
Glucose-Capillary: 157 mg/dL — ABNORMAL HIGH (ref 70–99)
Glucose-Capillary: 88 mg/dL (ref 70–99)

## 2023-05-13 LAB — MAGNESIUM: Magnesium: 2.1 mg/dL (ref 1.7–2.4)

## 2023-05-13 LAB — BASIC METABOLIC PANEL
Anion gap: 7 (ref 5–15)
BUN: 13 mg/dL (ref 8–23)
CO2: 20 mmol/L — ABNORMAL LOW (ref 22–32)
Calcium: 9.1 mg/dL (ref 8.9–10.3)
Chloride: 111 mmol/L (ref 98–111)
Creatinine, Ser: 0.77 mg/dL (ref 0.61–1.24)
GFR, Estimated: >60 mL/min (ref >60)
Glucose, Bld: 135 mg/dL — ABNORMAL HIGH (ref 70–99)
Potassium: 3.8 mmol/L (ref 3.5–5.1)
Sodium: 138 mmol/L (ref 135–145)

## 2023-05-13 MED ORDER — POTASSIUM CHLORIDE CRYS ER 20 MEQ PO TBCR
40.0000 meq | EXTENDED_RELEASE_TABLET | Freq: Once | ORAL | Status: AC
  Administered 2023-05-13: 40 meq via ORAL
  Filled 2023-05-13: qty 2

## 2023-05-13 NOTE — Assessment & Plan Note (Addendum)
Initial central chest pain with shallow breathing and radiation to the back, now resolved. Pt has hx of esophageal adenocarcinoma s/p esophagectomy in 2023. Suspect possible esophageal leak vs GERD. Esophagram (11/2) without leak or stricture.  --plan for MBS this afternoon  --protonix 40mg  BID  --tylenol 1000 mg q6hr scheduled, K-pad for mild pain --oxycodone 5mg  q4hr PRN for severe pain --continuous cardiac monitoring --SLP and RD following, greatly appreciate their recs

## 2023-05-13 NOTE — Evaluation (Signed)
Modified Barium Swallow Study  Patient Details  Name: Keith Moreno MRN: [id number] Date of Birth: [birth year]  Today's Date: 05/13/2023  Modified Barium Swallow completed.  Full report located under Chart Review in the Imaging Section.  History of Present Illness Patient is a 62 y.o. male with PMH: GERD, esophagectomy in 2023, hsa J-tube, dyspnea, HTN, pre-DM, PTSD. He presented to the hospital on 05/11/2023 with sudden onset SOB and chest pain. Per EMS report, patient with decreased oxygen saturations and was placed on NRB. Esophagram completed 11/2 which did r/o esophageal perforation/leak. 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   Clinical Impression Pt's oropharyngeal swallow is WFL. There is no aspiration observed and only trace, transient penetration at times with thin liquids (PAS 2, considered to be normal). Pharyngeal clearance is complete. Pt did cough during MBS after liquid but it was not related to any aspiration or penetraiton. Recommend that pt resume his baseline diet, which he describes as regular textures and thin liquids. Pt's main complaint with swallowing is feeling like he needs to belch and cannot do so. SLP reinforced education about strategies to use. No further acute SLP needs identified and pt is in agreement with SLP s/o.  Factors that may increase risk of adverse event in presence of aspiration Rubye Oaks & Clearance Coots 2021): Respiratory or GI disease  Swallow Evaluation Recommendations Recommendations: PO diet PO Diet Recommendation: Regular;Thin liquids (Level 0) Liquid Administration via: Cup Medication Administration: Whole meds with liquid Supervision: Patient able to self-feed Swallowing strategies  : Slow rate;Small bites/sips (smaller amounts of intake at a time) Postural changes: Position pt fully upright for meals;Stay upright  30-60 min after meals Oral care recommendations: Oral care BID (2x/day)      Mahala Menghini., M.A. CCC-SLP Acute Rehabilitation Services Office [phone]  Secure chat preferred  05/13/2023,1:16 PM

## 2023-05-13 NOTE — Plan of Care (Signed)
Spoke with cardiology regarding concern for afib in this patient. Per cardiology, the most recent EKGs are not consistent with afib. Cardiology does not recommend starting any anticoagulation at this time. Continue monitoring with telemetry while inpatient and may consider long term at home monitoring after discharge.

## 2023-05-13 NOTE — Assessment & Plan Note (Addendum)
QT remains prolonged on EKG this morning, with Qtc 587.  --avoid QT prolonging medications --cardiac monitoring

## 2023-05-13 NOTE — Assessment & Plan Note (Addendum)
Initial SBP as low as 70s as reported by EMS following nitroglycerin administration. Does have hx of hypotension per chart review. BP this morning 130/76.  --continue to hold home amlodipine 10mg  nightly, will add back as appropriate --vitals signs per unit protocol

## 2023-05-13 NOTE — Progress Notes (Signed)
Daily Progress Note Intern Pager: [phone]  Patient name: Keith Moreno Medical record number: [id number] Date of birth: April 08, 1961 Age: 62 y.o. Gender: male  Primary Care Provider: [facility] Consultants: CT surgery  Code Status: Full  Pt Overview and Major Events to Date:  11/1: Admitted to FMTS  Assessment and Plan:  Keith Moreno is a 62 year old male with pmh of esophageal cancer s/p esophagectomy (2023) presenting with chest pain and hypotension concerning for cardiac arrhythmia vs esophageal rupture vs GERD. Initial afib now resolved. Initial esophagram without leakage or stricture. Patient now normotensive with resolved chest pain.  Assessment & Plan Chest pain Initial central chest pain with shallow breathing and radiation to the back, now resolved. Pt has hx of esophageal adenocarcinoma s/p esophagectomy in 2023. Suspect possible esophageal leak vs GERD. Esophagram (11/2) without leak or stricture.  --plan for MBS this afternoon  --protonix 40mg  BID  --tylenol 1000 mg q6hr scheduled, K-pad for mild pain --oxycodone 5mg  q4hr PRN for severe pain --continuous cardiac monitoring --SLP and RD following, greatly appreciate their recs Hypotension Initial SBP as low as 70s as reported by EMS following nitroglycerin administration. Does have hx of hypotension per chart review. BP this morning 130/76.  --continue to hold home amlodipine 10mg  nightly, will add back as appropriate --vitals signs per unit protocol  Atrial fibrillation (HCC) No known diagnosis of paroxysmal atrial fibrillation, possible history of afib after surgery in past per chart review and possible afib on ED EKG. CHADSVASC of 3, not on any anticoagulation. Echo with small pericardial effusion, mild aortic dilatation, EF 55%.  -Reach out to cardiology regarding possible afib  -Consider holter monitor at discharge -Goal K>4, Mag>2  - K 3.8 this morning - repleted with K -TSH  ordered -AM BMP  Prolonged Q-T interval on ECG QT remains prolonged on EKG this morning, with Qtc 587.  --avoid QT prolonging medications --cardiac monitoring   Chronic and Stable Issues: Constipation: colace 50mg  nightly PTSD/insomina: melatonin 5 mg prn (reportedly takes 50mg  nightly at home), consider prazosin as needed T2DM: holding home pioglitazone 30mg  and sitagliptin 50mg  daily; sSSI and CBGs AC/HS GAD: Continue sertraline 25 mg daily - per chart review, patient may have been on 50mg  in the past - will clarify with patient   FEN/GI: NPO for MBS today  PPx: Lovenox 70 Dispo: Home pending clinical improvement    Subjective:  Patient feeling better this morning. Chest pain has resolved. No palpitations. Some "diaphragm pain" with chronic, unchanged cough. No abdominal pain.   Objective: Temp:  [98.4 F (36.9 C)-99.6 F (37.6 C)] 98.9 F (37.2 C) (11/03 0728) Pulse Rate:  [78-86] 81 (11/03 0728) Resp:  [18-20] 19 (11/03 0728) BP: (112-143)/(67-103) 130/76 (11/03 0728) SpO2:  [92 %-96 %] 92 % (11/03 0728) Weight:  [146.9 kg] 146.9 kg (11/03 0426) Physical Exam: General: Well-appearing. No acute distress. Cardiovascular: S1 louder than S2. No extra heart sounds. Warm and well-perfused.  Respiratory: Breathing comfortably on room air. Scattered expiratory wheezes, no crackles. Appropriate air movement throughout. No increased WOB. Abdomen: Soft, non-distended, non-tender. Extremities: Warm, dry.   Laboratory: Most recent CBC Lab Results  Component Value Date   WBC 11.0 (H) 05/12/2023   HGB 13.6 05/12/2023   HCT 40.5 05/12/2023   MCV 86.9 05/12/2023   PLT 171 05/12/2023   Most recent BMP    Latest Ref Rng & Units 05/13/2023    2:49 AM  BMP  Glucose 70 - 99 mg/dL 147  BUN 8 - 23 mg/dL 13   Creatinine 4.09 - 1.24 mg/dL 8.11   Sodium 914 - 782 mmol/L 138   Potassium 3.5 - 5.1 mmol/L 3.8   Chloride 98 - 111 mmol/L 111   CO2 22 - 32 mmol/L 20   Calcium 8.9 -  10.3 mg/dL 9.1    EKG (95/6): Qtc 587. No afib.   Imaging: Esophagram (11/2): No leak or stricture  Keith Quale, MD 05/13/2023, 7:38 AM  PGY-1, [facility] FPTS Intern pager: [phone], text pages welcome Secure chat group [facility] Teaching Service

## 2023-05-13 NOTE — Assessment & Plan Note (Addendum)
No known diagnosis of paroxysmal atrial fibrillation, possible history of afib after surgery in past per chart review and possible afib on ED EKG. CHADSVASC of 3, not on any anticoagulation. Echo with small pericardial effusion, mild aortic dilatation, EF 55%.  -Reach out to cardiology regarding possible afib  -Consider holter monitor at discharge -Goal K>4, Mag>2  - K 3.8 this morning - repleted with K -TSH ordered -AM BMP

## 2023-05-13 NOTE — Plan of Care (Signed)

## 2023-05-13 NOTE — Progress Notes (Signed)
Patient resumed regular diet. Feeling full after lunch. Requesting to not restart new bottle of osmolite 1.5 at goal achieved 60ml/hr until evening per normal routine due to intake of diet resumed.

## 2023-05-14 DIAGNOSIS — R0789 Other chest pain: Secondary | ICD-10-CM | POA: Diagnosis not present

## 2023-05-14 DIAGNOSIS — Z434 Encounter for attention to other artificial openings of digestive tract: Secondary | ICD-10-CM

## 2023-05-14 LAB — BASIC METABOLIC PANEL
Anion gap: 5 (ref 5–15)
BUN: 18 mg/dL (ref 8–23)
CO2: 24 mmol/L (ref 22–32)
Calcium: 9.2 mg/dL (ref 8.9–10.3)
Chloride: 109 mmol/L (ref 98–111)
Creatinine, Ser: 0.9 mg/dL (ref 0.61–1.24)
GFR, Estimated: >60 mL/min (ref >60)
Glucose, Bld: 134 mg/dL — ABNORMAL HIGH (ref 70–99)
Potassium: 3.8 mmol/L (ref 3.5–5.1)
Sodium: 138 mmol/L (ref 135–145)

## 2023-05-14 LAB — GLUCOSE, CAPILLARY
Glucose-Capillary: 133 mg/dL — ABNORMAL HIGH (ref 70–99)
Glucose-Capillary: 119 mg/dL — ABNORMAL HIGH (ref 70–99)
Glucose-Capillary: 86 mg/dL (ref 70–99)
Glucose-Capillary: 142 mg/dL — ABNORMAL HIGH (ref 70–99)

## 2023-05-14 LAB — TSH: TSH: 1.341 u[IU]/mL (ref 0.350–4.500)

## 2023-05-14 MED ORDER — PANTOPRAZOLE SODIUM 40 MG PO TBEC
40.0000 mg | DELAYED_RELEASE_TABLET | Freq: Two times a day (BID) | ORAL | Status: DC
  Administered 2023-05-14 – 2023-05-15 (×2): 40 mg via ORAL
  Filled 2023-05-14 (×2): qty 1

## 2023-05-14 MED ORDER — FAMOTIDINE 20 MG PO TABS
20.0000 mg | ORAL_TABLET | Freq: Two times a day (BID) | ORAL | Status: DC
  Administered 2023-05-14 – 2023-05-15 (×3): 20 mg via ORAL
  Filled 2023-05-14 (×3): qty 1

## 2023-05-14 NOTE — Assessment & Plan Note (Signed)
No known diagnosis of paroxysmal atrial fibrillation, possible history of afib after surgery in past per chart review and possible afib on ED EKG. CHADSVASC of 3, not on any anticoagulation. Echo with small pericardial effusion, mild aortic dilatation, EF 55%. Per cardiology, does not appear this was true Afib. TSH 1.341 -consider zio patch outpatient -AM BMP

## 2023-05-14 NOTE — TOC Progression Note (Signed)
Transition of Care [facility]) - Progression Note    Patient Details  Name: Keith Moreno MRN: [id number] Date of Birth: [birth year]  Transition of Care [facility]) CM/SW Contact  Leone Haven, RN Phone Number: 05/14/2023, 4:19 PM  Clinical Narrative:    Patient states he has tube feeds at home, but does not have the supplies, [facility] which the [facility] has been using.  We have bags here but they are not reusable, patient will need ,monthly supply of bags, will need to contact [facility] to see if they are not using the open system what system will they be using for tube feeds in order for this patient to continue getting his feeds. MD is aware.        Expected Discharge Plan and Services                                               Social Determinants of Health (SDOH) Interventions SDOH Screenings   Food Insecurity: No Food Insecurity (05/11/2023)  Housing: Low Risk  (05/11/2023)  Transportation Needs: No Transportation Needs (05/11/2023)  Utilities: Not At Risk (05/11/2023)  Depression (PHQ2-9): Low Risk  (08/18/2022)  Social Connections: Unknown (11/14/2022)   Received from [facility]  Tobacco Use: Medium Risk (05/11/2023)    Readmission Risk Interventions     No data to display

## 2023-05-14 NOTE — Assessment & Plan Note (Addendum)
Initial central chest pain with shallow breathing and radiation to the back, now resolved. Pt has hx of esophageal adenocarcinoma s/p esophagectomy in 2023. Esophagram (11/2) without leak or stricture and MBS without aspiration, SLP recommend regular diet with thin liquids and RD recommends 55mL/hr osmolite. Symptoms likely due to GERD plus slowed gastric emptying.  -- switch to PO protonix 40mg  BID -- add nightly pepcid -- tylenol 1000 mg q6hr scheduled, K-pad for mild pain -- RD following -- SW involved to assist with tube feed supplies

## 2023-05-14 NOTE — TOC Initial Note (Signed)
Transition of Care [facility]) - Initial/Assessment Note    Patient Details  Name: Keith Moreno MRN: [id number] Date of Birth: 01/19/1961  Transition of [facility]) CM/SW Contact:    Leone Haven, RN Phone Number: 05/14/2023, 4:24 PM  Clinical Narrative:                 From home with spouse, has PCP and insurance on file, states has no HH services in place , he has tube feeds at home, [place] was supplying the open system tube feed bags, and now no longer are supplying this. States family member will transport them home at [employer] and family is support system.  [facility]s will they be able to send to patient.    Expected Discharge Plan: Home/Self Care Barriers to Discharge: Continued Medical Work up   Patient Goals and CMS Choice Patient states their goals for this hospitalization and ongoing recovery are:: return home   Choice offered to / list presented to : NA      Expected Discharge Plan and Services   Discharge Planning Services: CM Consult Post Acute Care Choice: NA Living arrangements for the past 2 months: Single Family Home                   DME Agency: NA       HH Arranged: NA          Prior Living Arrangements/Services Living arrangements for the past 2 months: Single Family Home Lives with:: Spouse Patient language and need for interpreter reviewed:: Yes Do you feel safe going back to the place where you live?: Yes      Need for Family Participation in Patient Care: Yes (Comment) Care giver support system in place?: Yes (comment)   Criminal Activity/Legal Involvement Pertinent to Current Situation/Hospitalization: No - Comment as needed  Activities of Daily Living   ADL Screening (condition at time of admission) Independently performs ADLs?: Yes (appropriate for developmental age) Is the patient deaf or have difficulty hearing?: No Does the patient have difficulty seeing, even when wearing  glasses/contacts?: No Does the patient have difficulty concentrating, remembering, or making decisions?: Yes (history of TBI)  Permission Sought/Granted Permission sought to share information with : Case Manager Permission granted to share information with : Yes, Verbal Permission Granted              Emotional Assessment Appearance:: Appears stated age Attitude/Demeanor/Rapport: Engaged Affect (typically observed): Appropriate Orientation: : Oriented to Self, Oriented to Place, Oriented to  Time, Oriented to Situation Alcohol / Substance Use: Not Applicable Psych Involvement: No (comment)  Admission diagnosis:  Other specified hypotension [I95.89] Prolonged Q-T interval on ECG [R94.31] Chest pain [R07.9] Chest pain, unspecified type [R07.9] Patient Active Problem List   Diagnosis Date Noted   Encounter for jejunostomy care (HCC) 05/14/2023   Hypotension 05/12/2023   Prolonged Q-T interval on ECG 05/12/2023   Chest pain 05/11/2023   H/O esophagectomy 08/10/2022   Mediastinitis 05/30/2022   Pressure injury of skin 05/09/2022   Esophageal anastomotic leak 05/09/2022   Hypercalcemia 05/05/2022   History of esophagectomy 05/05/2022   Dysphagia 05/05/2022   Hypertension    Nocturnal hypoxia    Malnutrition of moderate degree 04/06/2022   Esophageal cancer (HCC) 04/05/2022   Port-A-Cath in place 12/26/2021   Esophageal adenocarcinoma (HCC) 12/12/2021   Mediastinal adenopathy 12/08/2021   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 [place] [address] Phone: [phone] Fax: [phone]  [facility] [address] [place] [address] Phone: [phone] Fax: [phone]     Social Determinants of Health (SDOH) Social  History: SDOH Screenings   Food Insecurity: No Food Insecurity (05/11/2023)  Housing: Low Risk  (05/11/2023)  Transportation Needs: No Transportation Needs (05/11/2023)  Utilities: Not At Risk (05/11/2023)  Depression (PHQ2-9): Low Risk  (08/18/2022)  Social Connections: Unknown (11/14/2022)   [facility]  Tobacco Use: Medium Risk (05/11/2023)   SDOH Interventions:     Readmission Risk Interventions     No data to display

## 2023-05-14 NOTE — Assessment & Plan Note (Addendum)
QT prolonged on initial and repeat EKG with Qtc 587.  --avoid QT prolonging medications

## 2023-05-14 NOTE — Plan of Care (Signed)
  Problem: Fluid Volume: Goal: Ability to maintain a balanced intake and output will improve Outcome: Progressing   Problem: Health Behavior/Discharge Planning: Goal: Ability to manage health-related needs will improve Outcome: Progressing   Problem: Clinical Measurements: Goal: Ability to maintain clinical measurements within normal limits will improve Outcome: Progressing

## 2023-05-14 NOTE — Progress Notes (Signed)
Daily Progress Note Intern Pager: [phone]  Patient name: Keith Moreno Medical record number: [id number] Date of birth: 1960-12-28 Age: 62 y.o. Gender: male  Primary Care Provider: [facility] Consultants: CT surgery Code Status: full  Pt Overview and Major Events to Date:  11/1: admitted to FMTS  Assessment and Plan: Jostin Rue is a 62 year old male with pmh of esophageal cancer s/p esophagectomy (2023) presenting with chest pain and hypotension concerning for cardiac arrhythmia vs esophageal rupture vs GERD. Initial afib now resolved, may not have been Afib per cardiology. Initial esophagram without leakage or stricture. Patient now normotensive with resolved chest pain.   Assessment & Plan Chest pain Initial central chest pain with shallow breathing and radiation to the back, now resolved. Pt has hx of esophageal adenocarcinoma s/p esophagectomy in 2023. Esophagram (11/2) without leak or stricture and MBS without aspiration, SLP recommend regular diet with thin liquids and RD recommends 4mL/hr osmolite. Symptoms likely due to GERD plus slowed gastric emptying.  -- switch to PO protonix 40mg  BID -- add nightly pepcid -- tylenol 1000 mg q6hr scheduled, K-pad for mild pain -- RD following -- SW involved to assist with tube feed supplies  Hypotension Initial SBP as low as 70s as reported by EMS following nitroglycerin administration. Does have hx of hypotension per chart review. BP this morning 129/79.  --continue to hold home amlodipine 10mg  nightly, will add back as appropriate --vitals signs per unit protocol  Prolonged Q-T interval on ECG QT prolonged on initial and repeat EKG with Qtc 587.  --avoid QT prolonging medications Atrial fibrillation (HCC) (Resolved: 05/14/2023) No known diagnosis of paroxysmal atrial fibrillation, possible history of afib after surgery in past per chart review and possible afib on ED EKG. CHADSVASC of 3, not on any  anticoagulation. Echo with small pericardial effusion, mild aortic dilatation, EF 55%. Per cardiology, does not appear this was true Afib. TSH 1.341 -consider zio patch outpatient -AM BMP    Chronic and Stable Issues: Constipation: colace 50mg  nightly PTSD/insomina: melatonin 5 mg prn (reportedly takes 50mg  nightly at home), consider prazosin as needed T2DM: holding home pioglitazone 30mg  and sitagliptin 50mg  daily; sSSI and CBGs AC/HS GAD: Continue sertraline 25 mg daily - per chart review, patient may have been on 50mg  in the past - will clarify with patient   FEN/GI: regular PPx: lovenox Dispo: pending tube feed supplies for home, SW to reach out to [place]  Subjective:  Pt states he feels well this morning, states he does not have any chest pain now. Asking to speak with unit social worker about his tube feed supplies. Feels comfortable going home today. Discussed importance of taking PPI 30 min prior to meals and avoiding trigger foods. Pt has phone appt with oncology RD this afternoon, will discuss GERD-friendly diet further.  Objective: Temp:  [98 F (36.7 C)-98.4 F (36.9 C)] 98.1 F (36.7 C) (11/04 0742) Pulse Rate:  [68-86] 68 (11/04 0742) Resp:  [17-20] 17 (11/04 0742) BP: (126-138)/(69-88) 129/79 (11/04 0742) SpO2:  [94 %-98 %] 94 % (11/04 0742) Weight:  [147.8 kg] 147.8 kg (11/04 0425) Physical Exam: General: sitting up in bed, pleasant and conversant, in NAD Cardiovascular: RRR, normal S1/S2 Respiratory: CTAB, normal WOB on RA Abdomen: normoactive bowel sounds, soft, nontender, nondistended  Laboratory: Most recent CBC Lab Results  Component Value Date   WBC 11.0 (H) 05/12/2023   HGB 13.6 05/12/2023   HCT 40.5 05/12/2023   MCV 86.9 05/12/2023   PLT  171 05/12/2023   Most recent BMP    Latest Ref Rng & Units 05/14/2023    3:02 AM  BMP  Glucose 70 - 99 mg/dL 161   BUN 8 - 23 mg/dL 18   Creatinine 0.96 - 1.24 mg/dL 0.45   Sodium 409 - 811 mmol/L 138    Potassium 3.5 - 5.1 mmol/L 3.8   Chloride 98 - 111 mmol/L 109   CO2 22 - 32 mmol/L 24   Calcium 8.9 - 10.3 mg/dL 9.2     Lorayne Bender, MD 05/14/2023, 8:19 AM  PGY-1, [place] [facility] FPTS Intern pager: [phone], text pages welcome Secure chat group [facility] Teaching Service

## 2023-05-14 NOTE — Assessment & Plan Note (Signed)
Initial SBP as low as 70s as reported by EMS following nitroglycerin administration. Does have hx of hypotension per chart review. BP this morning 129/79.  --continue to hold home amlodipine 10mg  nightly, will add back as appropriate --vitals signs per unit protocol

## 2023-05-15 ENCOUNTER — Encounter: Payer: Self-pay | Admitting: Hematology and Oncology

## 2023-05-15 ENCOUNTER — Other Ambulatory Visit (HOSPITAL_COMMUNITY): Payer: Self-pay

## 2023-05-15 DIAGNOSIS — R9431 Abnormal electrocardiogram [ECG] [EKG]: Secondary | ICD-10-CM | POA: Insufficient documentation

## 2023-05-15 LAB — GLUCOSE, CAPILLARY
Glucose-Capillary: 123 mg/dL — ABNORMAL HIGH (ref 70–99)
Glucose-Capillary: 107 mg/dL — ABNORMAL HIGH (ref 70–99)

## 2023-05-15 MED ORDER — SERTRALINE HCL 25 MG PO TABS
25.0000 mg | ORAL_TABLET | Freq: Every day | ORAL | 0 refills | Status: DC
  Filled 2023-05-15: qty 30, 30d supply, fill #0

## 2023-05-15 MED ORDER — FAMOTIDINE 20 MG PO TABS
20.0000 mg | ORAL_TABLET | Freq: Two times a day (BID) | ORAL | 0 refills | Status: DC
  Filled 2023-05-15: qty 60, 30d supply, fill #0

## 2023-05-15 MED ORDER — MELATONIN 10 MG PO TABS: 10.0000 mg | ORAL_TABLET | Freq: Every day | ORAL | Status: AC

## 2023-05-15 MED ORDER — MELATONIN 10 MG PO TABS
50.0000 mg | ORAL_TABLET | Freq: Every day | ORAL | 0 refills | Status: DC
  Filled 2023-05-15: qty 30, fill #0

## 2023-05-15 NOTE — Assessment & Plan Note (Signed)
QT prolonged on initial and repeat EKG with Qtc 587.  --avoid QT prolonging medications

## 2023-05-15 NOTE — Discharge Summary (Cosign Needed Addendum)
Family Medicine Teaching [facility] Discharge Summary  Patient name: Keith Moreno Medical record number: [id number] Date of birth: [birth year] Age: 62 y.o. Gender: male Date of Admission: 05/11/2023  Date of Discharge: 05/15/23 Admitting Physician: Lorayne Bender, MD  Primary Care Provider: [facility] Consultants: none  Indication for Hospitalization: chest pain  Discharge Diagnoses/Problem List:  Principal Problem for Admission: chest pain Other Problems addressed during stay:  Principal Problem:   Chest pain Active Problems:   Hypotension   Prolonged Q-T interval on ECG   Encounter for jejunostomy care [facility])   EKG abnormalities    Brief Hospital Course:  Keith Moreno is a 62 y.o.male with a history of esophageal adenocarcinoma s/p esophagectomy (2023) who was admitted to the [facility] medicine Teaching Service at [facility] for chest pain. His hospital course is detailed below:  Chest pain Acute onset of sharp, tight central chest pain unresponsive to nitroglycerin with no acute changes on EKG, negative troponins, no evidence of PE or dissection on CT angio. CT surgery curbsided in ED, recommended barium swallow to rule out esophageal rupture in pt with hx of esophagectomy in 2023. Initial esophagram showed no leakage or stricture. MBS was normal. Echo was ordered to rule out pericarditis, which showed EF 55%, mild aortic dilation, and small pericardial effusion. Likely that chest pain was due to GERD in setting of slow gastric emptying. Added Pepsid 20mg  BID. Symptoms resolved by the day of discharge.   Of note, bags are on national back order for tube feeds so he may need to use syringe at home without plunger part and utilize gravity for feeds.  He was provided supply of 9 tube feedings at discharge.   Abnormal cardiac rhythm Seen on initial EKG. Per chart review, pt had episode of Afib seen on telemetry during hospitalization in 2023. Cardiology was curbsided,  did not believe this was truly Afib and did not recommend starting anticoagulation. On the morning of discharge, pt also had short runs of 1st degree and 2nd degree type 1 AV block.   Hypotension Prior to arrival to the ED, likely due to nitroglycerin administration. Improved with IVF. Home amlodipine was held due to soft pressures.   Other chronic conditions were medically managed with home medications and formulary alternatives as necessary  Constipation: colace 50mg  nightly PTSD/insomina: melatonin 5 mg prn (reportedly takes 50mg  nightly at home), consider prazosin as needed T2DM: holding home pioglitazone 30mg  and sitagliptin 50mg  daily; sSSI and CBGs AC/HS GAD: sertraline 25 mg daily   PCP Follow-up Recommendations: Referral to cardiology for paroxysmal afib and aortic dilation, consider zio patch Consider prazosin for PTSD/sleep as pt reported taking 50mg  melatonin nightly Discuss restarting amlodipine as appropriate Clarify sertraline dosing (50mg  per chart review but reported taking 25mg  on admission)  Disposition: home  Discharge Condition: stable  Discharge Exam:  Vitals:   05/15/23 0700 05/15/23 0800  BP: 136/76 136/76  Pulse: 62 83  Resp: 18 18  Temp: 97.9 F (36.6 C) 97.9 F (36.6 C)  SpO2:     General: sitting up in bed, pleasant and conversant, in NAD Cardiovascular: RRR, normal S1/S2 Respiratory: CTAB, normal WOB on RA Abdomen: normoactive bowel sounds, soft, nontender, nondistended Extremities: no edema to BLE  Significant Procedures: echocardiogram (EF 55%, aortic dilatation), esophagram (no leak or stricture), modified barium swallow (normal)  Significant Labs and Imaging:  No results for input(s): "WBC", "HGB", "HCT", "PLT" in the last 48 hours. Recent Labs  Lab 05/14/23 0302  NA 138  K  3.8  CL 109  CO2 24  GLUCOSE 134*  BUN 18  CREATININE 0.90  CALCIUM 9.2   CT angio chest/abd/pelvis 1. No acute intrathoracic, abdominal, or pelvic  pathology. No aortic dissection or aneurysm. 2. Postsurgical changes of esophagectomy and gastric pull-through. 3. Similar appearance of hilar and mediastinal lymph nodes as well as pulmonary nodules. 4. Nonobstructing bilateral lower pole renal calculi. No hydronephrosis. 5. Colonic diverticulosis. No bowel obstruction. 6.  Aortic Atherosclerosis (ICD10-I70.0).  Results/Tests Pending at Time of Discharge: none  Discharge Medications:  Allergies as of 05/15/2023       Reactions   Procaine Shortness Of Breath   OK to use Lidocaine for IV starts   Chocolate    Hyperactivity, respiratory distress   Lisinopril    Other Reaction(s): Cough   Other    Novocaine- respiratory distress        Medication List     STOP taking these medications    amLODipine 10 MG tablet Commonly known as: NORVASC       TAKE these medications    acetaminophen 500 MG tablet Commonly known as: TYLENOL Take 2 tablets (1,000 mg total) by mouth every 6 (six) hours as needed for mild pain or fever. What changed: how much to take   benzonatate 100 MG capsule Commonly known as: TESSALON Take 100 mg by mouth daily as needed for cough.   DOCUSATE SODIUM PO Take 2-3 capsules by mouth at bedtime.   famotidine 20 MG tablet Commonly known as: PEPCID Take 1 tablet (20 mg total) by mouth 2 (two) times daily.   feeding supplement (OSMOLITE 1.5 CAL) Liqd Place 1,000 mLs into feeding tube See admin instructions. Take 1 can by gastric tube every 3 hours   lansoprazole 30 MG disintegrating tablet Commonly known as: PREVACID SOLUTAB Take 30 mg by mouth 2 (two) times daily before a meal. Dissolve 1 tablet in mouth twice a day before meals (take on an empty stomach 30 minutes prior to a meal)   lidocaine 5 % Commonly known as: LIDODERM Place 1 patch onto the skin See admin instructions. Apply 1 patch to the skin once daily for back pain (apply for 12 hours, then remove for 12 hours) as needed    Melatonin 10 MG Tabs Take 10 mg by mouth at bedtime. What changed:  medication strength how much to take   MULTIVITAMINS/MINERALS ADULT PO Take 1 each by mouth in the morning. Gummies   ondansetron 8 MG disintegrating tablet Commonly known as: ZOFRAN-ODT Take 1 tablet (8 mg total) by mouth every 8 (eight) hours as needed for nausea or vomiting.   pioglitazone 30 MG tablet Commonly known as: ACTOS Take 30 mg by mouth daily.   sertraline 25 MG tablet Commonly known as: ZOLOFT Take 1 tablet (25 mg total) by mouth daily. Start taking on: May 16, 2023   sitaGLIPtin 50 MG tablet Commonly known as: JANUVIA Take 50 mg by mouth at bedtime.        Discharge Instructions: Please refer to Patient Instructions section of EMR for full details.  Patient was counseled important signs and symptoms that should prompt return to medical care, changes in medications, dietary instructions, activity restrictions, and follow up appointments.   Follow-up Information     [facility]   Why: Please follow up in a week. Contact information: [address] [facility] [place] [address] [phone]  Lorayne Bender PGY-1, [place] [facility]  I reviewed medical decision making and verified the service and findings are accurately documented in the intern's note.  Erick Alley, DO 05/15/2023 7:52 PM

## 2023-05-15 NOTE — Progress Notes (Signed)
Daily Progress Note Intern Pager: [phone]  Patient name: Keith Moreno Medical record number: [id number] Date of birth: 18-Aug-1960 Age: 62 y.o. Gender: male  Primary Care Provider: [facility] Consultants: CT surgery Code Status: full  Pt Overview and Major Events to Date:  11/1: admitted to FMTS  Assessment and Plan: Usama Harkless is a 62 year old male with pmh of esophageal cancer s/p esophagectomy (2023) presenting with chest pain and hypotension concerning for cardiac arrhythmia vs esophageal rupture vs GERD. Initial afib now resolved, may not have been Afib per cardiology. Initial esophagram without leakage or stricture. Patient now normotensive with resolved chest pain.   Assessment & Plan Chest pain Initial central chest pain with shallow breathing and radiation to the back, now resolved. Pt has hx of esophageal adenocarcinoma s/p esophagectomy in 2023. Esophagram (11/2) without leak or stricture and MBS without aspiration, SLP recommend regular diet with thin liquids and RD recommends 3mL/hr osmolite. Symptoms likely due to GERD plus slowed gastric emptying.  -- continue PO protonix 40mg  BID -- continue nightly pepcid 20mg  BID -- tylenol 1000 mg q6hr scheduled, K-pad for mild pain -- RD following -- SW involved to assist with tube feed supplies, [facility]  Hypotension Initial SBP as low as 70s as reported by EMS following nitroglycerin administration. Does have hx of hypotension per chart review. BP this morning 129/79.  --continue to hold home amlodipine 10mg  nightly, will add back as appropriate --vitals signs per unit protocol  Prolonged Q-T interval on ECG QT prolonged on initial and repeat EKG with Qtc 587.  --avoid QT prolonging medications EKG abnormalities Questionable Afib on initial EKG, cardiology deemed not true Afib. Short runs of 2nd degree AV block type 1 and 1st degree AV block this morning. --zio patch at  discharge   Chronic and Stable Issues: Constipation: colace 50mg  nightly PTSD/insomina: melatonin 5 mg prn (reportedly takes 50mg  nightly at home), consider prazosin as needed T2DM: holding home pioglitazone 30mg  and sitagliptin 50mg  daily; sSSI and CBGs AC/HS GAD: Continue sertraline 25 mg daily - per chart review, patient may have been on 50mg  in the past - will clarify with patient   FEN/GI: regular PPx: lovenox Dispo: home pending tube feed supplies from [place]  Subjective:  Pt states he is feeling fine this morning. Denies chest pain. No questions or concerns.  Per TOC, there is a Sport and exercise psychologist of tube feed supplies so pt will need to use syringe at home and to take the plunger out and use it as gravity feeds.  Objective: Temp:  [97.8 F (36.6 C)-98.5 F (36.9 C)] 97.9 F (36.6 C) (11/05 0700) Pulse Rate:  [62-69] 62 (11/05 0700) Resp:  [18-20] 18 (11/05 0700) BP: (129-143)/(76-85) 136/76 (11/05 0700) SpO2:  [94 %-96 %] 95 % (11/05 0415) Weight:  [148.2 kg] 148.2 kg (11/05 0415) Physical Exam: General: sitting up in bed, pleasant and conversant, in NAD Cardiovascular: RRR, normal S1/S2 Respiratory: CTAB, normal WOB on RA Abdomen: normoactive bowel sounds, soft, nontender, nondistended Extremities: no edema to BLE  Laboratory: Most recent CBC Lab Results  Component Value Date   WBC 11.0 (H) 05/12/2023   HGB 13.6 05/12/2023   HCT 40.5 05/12/2023   MCV 86.9 05/12/2023   PLT 171 05/12/2023   Most recent BMP    Latest Ref Rng & Units 05/14/2023    3:02 AM  BMP  Glucose 70 - 99 mg/dL 308   BUN 8 - 23 mg/dL 18  Creatinine 0.61 - 1.24 mg/dL 8.75   Sodium 643 - 329 mmol/L 138   Potassium 3.5 - 5.1 mmol/L 3.8   Chloride 98 - 111 mmol/L 109   CO2 22 - 32 mmol/L 24   Calcium 8.9 - 10.3 mg/dL 9.2      Lorayne Bender, MD 05/15/2023, 7:50 AM  PGY-1, [place] [facility] FPTS Intern pager: [phone], text pages welcome Secure chat group [facility] Teaching Service

## 2023-05-15 NOTE — TOC Transition Note (Signed)
Transition of Care [facility]) - CM/SW Discharge Note   Patient Details  Name: Keith Moreno MRN: [id number] Date of Birth: [birth year]  [facility]) CM/SW Contact:  Leone Haven, RN Phone Number: 05/15/2023, 11:34 AM   Clinical Narrative:    For dc today, wife will transport him home.       Barriers to Discharge: Continued Medical Work up   Patient Goals and CMS Choice   Choice offered to / list presented to : NA  Discharge Placement                         Discharge Plan and Services Additional resources added to the After Visit Summary for     Discharge Planning Services: CM Consult Post Acute Care Choice: NA            DME Agency: NA       HH Arranged: NA          Social Determinants of Health (SDOH) Interventions SDOH Screenings   Food Insecurity: No Food Insecurity (05/11/2023)  Housing: Low Risk  (05/11/2023)  Transportation Needs: No Transportation Needs (05/11/2023)  Utilities: Not At Risk (05/11/2023)  Depression (PHQ2-9): Low Risk  (08/18/2022)  Social Connections: Unknown (11/14/2022)   Received from [facility]  Tobacco Use: Medium Risk (05/11/2023)     Readmission Risk Interventions     No data to display

## 2023-05-15 NOTE — Assessment & Plan Note (Signed)
Initial SBP as low as 70s as reported by EMS following nitroglycerin administration. Does have hx of hypotension per chart review. BP this morning 129/79.  --continue to hold home amlodipine 10mg  nightly, will add back as appropriate --vitals signs per unit protocol

## 2023-05-15 NOTE — Assessment & Plan Note (Addendum)
Questionable Afib on initial EKG, cardiology deemed not true Afib. Short runs of 2nd degree AV block type 1 and 1st degree AV block this morning. --zio patch at discharge

## 2023-05-15 NOTE — TOC Progression Note (Addendum)
Transition of Care [facility]) - Progression Note    Patient Details  Name: Keith Moreno MRN: [id number] Date of Birth: 11-29-1960  Transition of [facility]) CM/SW Contact  Leone Haven, RN Phone Number: 05/15/2023, 8:57 AM  Clinical Narrative:    NCM spoke with the nutritionist at the [facility] that is working with patient ,  Scherrie Bateman  [phone] ext [phone], she states the bags are on national back order and the pharmacy is not sure when they will get any more, so she suggested to patient to use the syringe at home without the plunger part, he just needs to hold it and use it with gravity, since he says he can not tolerate the plunger.   He should be able to be discharged with this.  NCM informed MD of information and Staff RN.  Rosalita Chessman will check back with the patient when he goes home as well. Staff Nurse gave patient 9 bags to go home with also.   Expected Discharge Plan: Home/Self Care Barriers to Discharge: Continued Medical Work up  Expected Discharge Plan and Services   Discharge Planning Services: CM Consult Post Acute Care Choice: NA Living arrangements for the past 2 months: Single Family Home                   DME Agency: NA       HH Arranged: NA           Social Determinants of Health (SDOH) Interventions SDOH Screenings   Food Insecurity: No Food Insecurity (05/11/2023)  Housing: Low Risk  (05/11/2023)  Transportation Needs: No Transportation Needs (05/11/2023)  Utilities: Not At Risk (05/11/2023)  Depression (PHQ2-9): Low Risk  (08/18/2022)  Social Connections: Unknown (11/14/2022)   Received from [facility]  Tobacco Use: Medium Risk (05/11/2023)    Readmission Risk Interventions     No data to display

## 2023-05-15 NOTE — Progress Notes (Signed)
Patient given a supply (9) of tube feedings bags that allow for tube feeding in carton to be pour into the bag.

## 2023-05-15 NOTE — Progress Notes (Signed)
RN notified by CCMD of 2nd degree AVB at 0700, and again at 0746. Patient asymptomatic, provider notified, no new orders. Will continue to monitor.

## 2023-05-15 NOTE — Assessment & Plan Note (Signed)
Initial central chest pain with shallow breathing and radiation to the back, now resolved. Pt has hx of esophageal adenocarcinoma s/p esophagectomy in 2023. Esophagram (11/2) without leak or stricture and MBS without aspiration, SLP recommend regular diet with thin liquids and RD recommends 23mL/hr osmolite. Symptoms likely due to GERD plus slowed gastric emptying.  -- continue PO protonix 40mg  BID -- continue nightly pepcid 20mg  BID -- tylenol 1000 mg q6hr scheduled, K-pad for mild pain -- RD following -- SW involved to assist with tube feed supplies, [facility]

## 2023-05-15 NOTE — Plan of Care (Signed)

## 2023-05-15 NOTE — Discharge Instructions (Addendum)
Dear Keith Moreno,   Thank you for letting us participate in your care! During your hospital admission, we treated you for chest pain. We believe the chest pain was caused by severe acid reflux. We treated you with a PPI medication and added Pepsid, which is another medication to help reduce reflux symptoms. We also looked at your heart with an echocardiogram, which showed some dilation of the aorta. We recommend that your primary care provider refers you to cardiology for further workup.   As you know, there are issues getting your tube feed supplies due to a national shortage. We contacted your dietician at the [place], who advised that you use the syringe at home and to take the plunger out and use it as gravity feeds for the time being.   POST-HOSPITAL & CARE INSTRUCTIONS We recommend following up with your PCP within 1 week from being discharged from the hospital. Please let PCP/Specialists know of any changes in medications that were made which you will be able to see in the medications section of this packet.   DOCTOR'S APPOINTMENTS & FOLLOW UP Future Appointments  Date Time Provider Department Center  07/20/2023 10:45 AM [facility] None  07/20/2023 11:20 AM Jaci Standard, MD [facility] None     Thank you for choosing [facility]! Take care and be well!  Family Medicine Teaching Service Inpatient Team [place]  [facility]  [address] [place], [place] [address] [phone]

## 2023-07-20 ENCOUNTER — Inpatient Hospital Stay: Payer: No Typology Code available for payment source | Attending: Physician Assistant

## 2023-07-20 ENCOUNTER — Other Ambulatory Visit: Payer: Self-pay | Admitting: Hematology and Oncology

## 2023-07-20 ENCOUNTER — Inpatient Hospital Stay (HOSPITAL_BASED_OUTPATIENT_CLINIC_OR_DEPARTMENT_OTHER): Payer: No Typology Code available for payment source | Admitting: Hematology and Oncology

## 2023-07-20 VITALS — BP 139/84 | HR 80 | Temp 98.2°F | Resp 16 | Wt 313.7 lb

## 2023-07-20 DIAGNOSIS — Z818 Family history of other mental and behavioral disorders: Secondary | ICD-10-CM | POA: Insufficient documentation

## 2023-07-20 DIAGNOSIS — Z9221 Personal history of antineoplastic chemotherapy: Secondary | ICD-10-CM | POA: Insufficient documentation

## 2023-07-20 DIAGNOSIS — Z87442 Personal history of urinary calculi: Secondary | ICD-10-CM | POA: Insufficient documentation

## 2023-07-20 DIAGNOSIS — Z9089 Acquired absence of other organs: Secondary | ICD-10-CM | POA: Diagnosis not present

## 2023-07-20 DIAGNOSIS — Z8249 Family history of ischemic heart disease and other diseases of the circulatory system: Secondary | ICD-10-CM | POA: Insufficient documentation

## 2023-07-20 DIAGNOSIS — Z833 Family history of diabetes mellitus: Secondary | ICD-10-CM | POA: Insufficient documentation

## 2023-07-20 DIAGNOSIS — Z9049 Acquired absence of other specified parts of digestive tract: Secondary | ICD-10-CM | POA: Insufficient documentation

## 2023-07-20 DIAGNOSIS — Z8052 Family history of malignant neoplasm of bladder: Secondary | ICD-10-CM | POA: Diagnosis not present

## 2023-07-20 DIAGNOSIS — C155 Malignant neoplasm of lower third of esophagus: Secondary | ICD-10-CM | POA: Diagnosis present

## 2023-07-20 DIAGNOSIS — Z79899 Other long term (current) drug therapy: Secondary | ICD-10-CM | POA: Insufficient documentation

## 2023-07-20 DIAGNOSIS — C159 Malignant neoplasm of esophagus, unspecified: Secondary | ICD-10-CM

## 2023-07-20 DIAGNOSIS — Z95828 Presence of other vascular implants and grafts: Secondary | ICD-10-CM

## 2023-07-20 DIAGNOSIS — Z800 Family history of malignant neoplasm of digestive organs: Secondary | ICD-10-CM | POA: Diagnosis not present

## 2023-07-20 DIAGNOSIS — Z888 Allergy status to other drugs, medicaments and biological substances status: Secondary | ICD-10-CM | POA: Insufficient documentation

## 2023-07-20 DIAGNOSIS — Z923 Personal history of irradiation: Secondary | ICD-10-CM | POA: Insufficient documentation

## 2023-07-20 DIAGNOSIS — Z8051 Family history of malignant neoplasm of kidney: Secondary | ICD-10-CM | POA: Diagnosis not present

## 2023-07-20 DIAGNOSIS — Z87891 Personal history of nicotine dependence: Secondary | ICD-10-CM | POA: Insufficient documentation

## 2023-07-20 DIAGNOSIS — I10 Essential (primary) hypertension: Secondary | ICD-10-CM | POA: Insufficient documentation

## 2023-07-20 DIAGNOSIS — Z8616 Personal history of COVID-19: Secondary | ICD-10-CM | POA: Diagnosis not present

## 2023-07-20 DIAGNOSIS — Z7901 Long term (current) use of anticoagulants: Secondary | ICD-10-CM | POA: Diagnosis not present

## 2023-07-20 LAB — CBC WITH DIFFERENTIAL (CANCER CENTER ONLY)
Abs Immature Granulocytes: 0.01 10*3/uL (ref 0.00–0.07)
Basophils Absolute: 0 10*3/uL (ref 0.0–0.1)
Basophils Relative: 0 %
Eosinophils Absolute: 0.1 10*3/uL (ref 0.0–0.5)
Eosinophils Relative: 3 %
HCT: 40.3 % (ref 39.0–52.0)
Hemoglobin: 13.2 g/dL (ref 13.0–17.0)
Immature Granulocytes: 0 %
Lymphocytes Relative: 8 %
Lymphs Abs: 0.4 10*3/uL — ABNORMAL LOW (ref 0.7–4.0)
MCH: 28.5 pg (ref 26.0–34.0)
MCHC: 32.8 g/dL (ref 30.0–36.0)
MCV: 87 fL (ref 80.0–100.0)
Monocytes Absolute: 0.4 10*3/uL (ref 0.1–1.0)
Monocytes Relative: 7 %
Neutro Abs: 4.1 10*3/uL (ref 1.7–7.7)
Neutrophils Relative %: 82 %
Platelet Count: 186 10*3/uL (ref 150–400)
RBC: 4.63 MIL/uL (ref 4.22–5.81)
RDW: 15.4 % (ref 11.5–15.5)
WBC Count: 5 10*3/uL (ref 4.0–10.5)
nRBC: 0 % (ref 0.0–0.2)

## 2023-07-20 LAB — CMP (CANCER CENTER ONLY)
ALT: 25 U/L (ref 0–44)
AST: 21 U/L (ref 15–41)
Albumin: 4.1 g/dL (ref 3.5–5.0)
Alkaline Phosphatase: 129 U/L — ABNORMAL HIGH (ref 38–126)
Anion gap: 4 — ABNORMAL LOW (ref 5–15)
BUN: 18 mg/dL (ref 8–23)
CO2: 31 mmol/L (ref 22–32)
Calcium: 10.2 mg/dL (ref 8.9–10.3)
Chloride: 103 mmol/L (ref 98–111)
Creatinine: 0.85 mg/dL (ref 0.61–1.24)
GFR, Estimated: >60 mL/min (ref >60)
Glucose, Bld: 68 mg/dL — ABNORMAL LOW (ref 70–99)
Potassium: 4.3 mmol/L (ref 3.5–5.1)
Sodium: 138 mmol/L (ref 135–145)
Total Bilirubin: 0.6 mg/dL (ref 0.0–1.2)
Total Protein: 7.2 g/dL (ref 6.5–8.1)

## 2023-07-20 NOTE — Progress Notes (Signed)
 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 Stage IIB (cT2,cN0, cM0) 11/13/2021: Presented to the emergency department complaining of dysphagia and food impaction. Underwent EGD with Dr. Albertus. Findings revealed medium sized fungating mass in the lower third of th esophagus, 35 cm from the incisors and extending into the GE junction.   It appears to have arisen in the background of Barrett's esophagus, the mass was nonobstructing and partially circumferential. Pathology confirmed moderately to poorly differentiated adenocarcinoma.  11/30/2021: PET scan showed  hypermetabolic activity in the distal esophagus with an SUV of 7.7 with thickening measuring up to 15 mm and spanning just to above the GE junction.  Diffuse mediastinal adenopathy showed dense calcification in all nearly symmetric findings.  12/06/2021: Established care with Radiation Oncology with Dr. Norleen Limes and Donald Husband PA-C 12/12/2021: Establish care with Medication Oncology with Dr. Norleen Federico and Johnston Neomi PA-C 12/26/2021: Started Carbo/Paclitaxel  with radiation. End chemotherapy on 01/23/2022 (last dose on 01/30/2022 held due to neutropenia). Last radiation 02/02/2022.   Interval History:  Keith Moreno 63 y.o. male with medical history significant for localized esophageal adenocarcinoma who presents for a follow up visit. The patient's last visit was on 04/18/2023. In the interim since the last visit he has not been hospitalized.   On exam today Mr. Laroque is accompanied by his wife. He reports his health has been  about the same in interim since her last visit.  He continues to have dysphagia to liquids but not to solids.  He reports that he does have an upcoming appointment with cardiology due to signs of atrial fibrillation.  He reports that they were monitoring his pattern on a home cardiac monitoring device.  He reports that milk for some reason is easier for him to swallow as opposed to water  or sodas.  He reports he feels like air is trapped.  He notes he is not having any trouble with solid foods such as steak, hamburger, or chicken.  He reports he has continued to lose weight, down about 18 pounds in last 3 months.  He notes he is not currently having any pain anywhere.  He denies any nausea, vomiting, or diarrhea there was bowel movements do vary in color and can sometimes be dark.  He reports that he is eager to be losing weight and is actually trying to continue the weight loss.SABRA  He notes that otherwise he has been at his baseline level of health and has no questions, concerns, or complaints today.  He denies fevers, chills, shortness of breath, chest pain or cough. He has no other complaints.  A full 10 point ROS was otherwise negative.  MEDICAL HISTORY:  Past Medical History:  Diagnosis Date   Anxiety    Cancer (HCC)    Esophageal Cancer   COVID 2022   mild case   Dyspnea    r/t chemo and radiation   Headache    History of kidney stones    Hypertension    Kidney infection    at age 50   Pneumonia    63 years old   Pre-diabetes    PTSD (post-traumatic stress disorder)  per pt, if woken up suddenly he cocks back arm as if to punch but usually wakes up enough to come to before he hits anyone    SURGICAL HISTORY: Past Surgical History:  Procedure Laterality Date   BIOPSY  11/13/2021   Procedure: BIOPSY;  Surgeon: Albertus Gordy HERO, MD;  Location: [facility] ENDOSCOPY;  Service: Gastroenterology;;   BIOPSY  12/14/2021   Procedure: BIOPSY;  Surgeon: Wilhelmenia Aloha Raddle., MD;  Location: [facility]  ENDOSCOPY;  Service: Gastroenterology;;   BRONCHIAL BRUSHINGS  12/14/2021   Procedure: BRONCHIAL BRUSHINGS;  Surgeon: Shelah Lamar RAMAN, MD;  Location: [facility] ENDOSCOPY;  Service: Cardiopulmonary;;   BRONCHIAL NEEDLE ASPIRATION BIOPSY  12/14/2021   Procedure: BRONCHIAL NEEDLE ASPIRATION BIOPSIES;  Surgeon: Shelah Lamar RAMAN, MD;  Location: [facility] ENDOSCOPY;  Service: Cardiopulmonary;;   COLONOSCOPY  2018   CYST EXCISION  2022   left side of head   ESOPHAGEAL STENT PLACEMENT N/A 06/27/2022   Procedure: ESOPHAGEAL STENT REMOVAL;  Surgeon: Shyrl Linnie KIDD, MD;  Location: [facility] OR;  Service: Thoracic;  Laterality: N/A;   ESOPHAGEAL STENT PLACEMENT N/A 06/28/2022   Procedure: ESOPHAGEAL STENT PLACEMENT;  Surgeon: Shyrl Linnie KIDD, MD;  Location: [facility] OR;  Service: Thoracic;  Laterality: N/A;   ESOPHAGEAL STENT PLACEMENT N/A 08/10/2022   Procedure: ESOPHAGEAL STENT REMOVAL;  Surgeon: Shyrl Linnie KIDD, MD;  Location: [facility] OR;  Service: Thoracic;  Laterality: N/A;   ESOPHAGECTOMY  04/05/2022   ESOPHAGOGASTRODUODENOSCOPY N/A 11/13/2021   Procedure: ESOPHAGOGASTRODUODENOSCOPY (EGD);  Surgeon: Albertus Gordy HERO, MD;  Location: [facility] ENDOSCOPY;  Service: Gastroenterology;  Laterality: N/A;   ESOPHAGOGASTRODUODENOSCOPY N/A 04/05/2022   Procedure: ESOPHAGOGASTRODUODENOSCOPY (EGD);  Surgeon: Shyrl Linnie KIDD, MD;  Location: [facility] OR;  Service: Thoracic;  Laterality: N/A;   ESOPHAGOGASTRODUODENOSCOPY N/A 05/09/2022   Procedure: ESOPHAGOGASTRODUODENOSCOPY (EGD);  Surgeon: Shyrl Linnie KIDD, MD;  Location: [facility] OR;  Service: Thoracic;  Laterality: N/A;  esophageal stent placement   ESOPHAGOGASTRODUODENOSCOPY N/A 06/27/2022   Procedure: ESOPHAGOGASTRODUODENOSCOPY (EGD);  Surgeon: Shyrl Linnie KIDD, MD;  Location: [facility] OR;  Service: Thoracic;  Laterality: N/A;   ESOPHAGOGASTRODUODENOSCOPY N/A 06/28/2022   Procedure: ESOPHAGOGASTRODUODENOSCOPY (EGD);  Surgeon: Shyrl Linnie KIDD, MD;  Location: [facility] OR;  Service: Thoracic;   Laterality: N/A;   ESOPHAGOGASTRODUODENOSCOPY N/A 08/10/2022   Procedure: ESOPHAGOGASTRODUODENOSCOPY (EGD);  Surgeon: Shyrl Linnie KIDD, MD;  Location: [facility] OR;  Service: Thoracic;  Laterality: N/A;   ESOPHAGOGASTRODUODENOSCOPY (EGD) WITH PROPOFOL  N/A 12/14/2021   Procedure: ESOPHAGOGASTRODUODENOSCOPY (EGD) WITH PROPOFOL ;  Surgeon: Wilhelmenia Aloha Raddle., MD;  Location: [facility] ENDOSCOPY;  Service: Gastroenterology;  Laterality: N/A;   EUS N/A 12/14/2021   Procedure: UPPER ENDOSCOPIC ULTRASOUND (EUS) LINEAR;  Surgeon: Wilhelmenia Aloha Raddle., MD;  Location: [facility] ENDOSCOPY;  Service: Gastroenterology;  Laterality: N/A;   FINE NEEDLE ASPIRATION  12/14/2021   Procedure: FINE NEEDLE ASPIRATION (FNA) LINEAR;  Surgeon: Wilhelmenia Aloha Raddle., MD;  Location: [facility] ENDOSCOPY;  Service: Gastroenterology;;   FOREIGN BODY REMOVAL  11/13/2021   Procedure: FOREIGN BODY REMOVAL;  Surgeon: Albertus Gordy HERO, MD;  Location: [facility] ENDOSCOPY;  Service: Gastroenterology;;   HUMERUS FRACTURE SURGERY Right    INTERCOSTAL NERVE BLOCK  04/05/2022   Procedure: INTERCOSTAL NERVE BLOCK;  Surgeon: Shyrl Linnie KIDD, MD;  Location: [facility] OR;  Service: Thoracic;;   IR IMAGING GUIDED PORT INSERTION  12/23/2021   IR REMOVAL TUN ACCESS W/ PORT W/O FL MOD SED  05/04/2023   IR REPLC DUODEN/JEJUNO TUBE PERCUT W/FLUORO  10/12/2022   IR THORACENTESIS ASP PLEURAL SPACE W/IMG GUIDE  04/19/2022   LAPAROSCOPIC JEJUNOSTOMY  N/A 05/09/2022   Procedure: LAPAROSCOPIC JEJUNOSTOMY;  Surgeon: Shyrl Linnie KIDD, MD;  Location: [facility] OR;  Service: Thoracic;  Laterality: N/A;   MULTIPLE TOOTH EXTRACTIONS  2015   NODE DISSECTION  04/05/2022   Procedure: NODE DISSECTION;  Surgeon: Shyrl Linnie KIDD, MD;  Location: [facility] OR;  Service: Thoracic;;   TONSILLECTOMY AND ADENOIDECTOMY     as a child   VIDEO ASSISTED THORACOSCOPY Right 05/09/2022   Procedure: VIDEO ASSISTED THORACOSCOPY;  Surgeon: Shyrl Linnie KIDD, MD;  Location: [facility] OR;  Service: Thoracic;   Laterality: Right;   VIDEO BRONCHOSCOPY WITH ENDOBRONCHIAL ULTRASOUND Bilateral 12/14/2021   Procedure: VIDEO BRONCHOSCOPY WITH ENDOBRONCHIAL ULTRASOUND;  Surgeon: Shelah Lamar RAMAN, MD;  Location: [facility] ENDOSCOPY;  Service: Cardiopulmonary;  Laterality: Bilateral;    SOCIAL HISTORY: Social History   Socioeconomic History   Marital status: Single    Spouse name: Not on file   Number of children: Not on file   Years of education: Not on file   Highest education level: Not on file  Occupational History   Not on file  Tobacco Use   Smoking status: Former    Current packs/day: 0.00    Types: Cigars, Cigarettes, Pipe    Quit date: 47    Years since quitting: 42.0   Smokeless tobacco: Former    Types: Chew    Quit date: 1983  Vaping Use   Vaping status: Never Used  Substance and Sexual Activity   Alcohol use: Not Currently    Comment: Rarely   Drug use: No   Sexual activity: Yes  Other Topics Concern   Not on file  Social History Narrative   Not on file   Social Drivers of Health   Financial Resource Strain: Not on file  Food Insecurity: No Food Insecurity (05/11/2023)   Hunger Vital Sign    Worried About Running Out of Food in the Last Year: Never true    Ran Out of Food in the Last Year: Never true  Transportation Needs: No Transportation Needs (05/11/2023)   PRAPARE - Administrator, Civil Service (Medical): No    Lack of Transportation (Non-Medical): No  Physical Activity: Not on file  Stress: Not on file  Social Connections: Unknown (11/14/2022)   [facility]   Social Network    Social Network: Not on file  Intimate Partner Violence: Not At Risk (05/11/2023)   Humiliation, Afraid, Rape, and Kick questionnaire    Fear of Current or Ex-Partner: No    Emotionally Abused: No    Physically Abused: No    Sexually Abused: No    FAMILY HISTORY: Family History  Problem Relation Age of Onset   Hypertension Mother    Diabetes  Mother    Bipolar disorder Mother    Hypertension Father    Kidney cancer Sister    Colon cancer Paternal Uncle    Cancer Cousin    Bladder Cancer Cousin    Cancer Cousin     ALLERGIES:  is allergic to procaine, chocolate, lisinopril, and other.  MEDICATIONS:  Current Outpatient Medications  Medication Sig Dispense Refill   amLODipine  (NORVASC ) 10 MG tablet Take 10 mg by mouth daily.     apixaban  (ELIQUIS ) 2.5 MG TABS tablet Take 2.5 mg by mouth 2 (two) times daily.     acetaminophen  (TYLENOL ) 500 MG tablet Take 2 tablets (1,000 mg total) by mouth every 6 (six) hours as needed for mild pain or fever. (Patient taking differently:  Take 500 mg by mouth every 6 (six) hours as needed for mild pain (pain score 1-3) or fever.) 30 tablet 0   benzonatate (TESSALON) 100 MG capsule Take 100 mg by mouth daily as needed for cough.     DOCUSATE SODIUM  PO Take 2-3 capsules by mouth at bedtime.     famotidine  (PEPCID ) 20 MG tablet Take 1 tablet (20 mg total) by mouth 2 (two) times daily. 60 tablet 0   lansoprazole  (PREVACID  SOLUTAB) 30 MG disintegrating tablet Take 30 mg by mouth 2 (two) times daily before a meal. Dissolve 1 tablet in mouth twice a day before meals (take on an empty stomach 30 minutes prior to a meal)     lidocaine  (LIDODERM ) 5 % Place 1 patch onto the skin See admin instructions. Apply 1 patch to the skin once daily for back pain (apply for 12 hours, then remove for 12 hours) as needed     Melatonin 10 MG TABS Take 10 mg by mouth at bedtime.     Multiple Vitamins-Minerals (MULTIVITAMINS/MINERALS ADULT PO) Take 1 each by mouth in the morning. Gummies     Nutritional Supplements (FEEDING SUPPLEMENT, OSMOLITE 1.5 CAL,) LIQD Place 1,000 mLs into feeding tube See admin instructions. Take 1 can by gastric tube every 3 hours     ondansetron  (ZOFRAN -ODT) 8 MG disintegrating tablet Take 1 tablet (8 mg total) by mouth every 8 (eight) hours as needed for nausea or vomiting. 90 tablet 3    pioglitazone (ACTOS) 30 MG tablet Take 30 mg by mouth daily.     sertraline  (ZOLOFT ) 25 MG tablet Take 1 tablet (25 mg total) by mouth daily. 30 tablet 0   sitaGLIPtin (JANUVIA) 50 MG tablet Take 50 mg by mouth at bedtime.     No current facility-administered medications for this visit.    REVIEW OF SYSTEMS:   Constitutional: ( - ) fevers, ( - )  chills , ( - ) night sweats Eyes: ( - ) blurriness of vision, ( - ) double vision, ( - ) watery eyes Ears, nose, mouth, throat, and face: ( - ) mucositis, ( - ) sore throat Respiratory: ( - ) cough, ( - ) dyspnea, ( - ) wheezes Cardiovascular: ( - ) palpitation, ( - ) chest discomfort, ( - ) lower extremity swelling Gastrointestinal:  ( + ) nausea, ( - ) heartburn, ( - ) change in bowel habits Skin: ( - ) abnormal skin rashes Lymphatics: ( - ) new lymphadenopathy, ( - ) easy bruising Neurological: ( - ) numbness, ( - ) tingling, ( - ) new weaknesses Behavioral/Psych: ( - ) mood change, ( - ) new changes  All other systems were reviewed with the patient and are negative.  PHYSICAL EXAMINATION: ECOG PERFORMANCE STATUS: 1 - Symptomatic but completely ambulatory  Vitals:   07/20/23 1137  BP: 139/84  Pulse: 80  Resp: 16  Temp: 98.2 F (36.8 C)  SpO2: 99%   Filed Weights   07/20/23 1137  Weight: (!) 313 lb 11.2 oz (142.3 kg)    GENERAL: Well-appearing elderly Caucasian male, alert, no distress and comfortable SKIN: skin color, texture, turgor are normal, no rashes or significant lesions EYES: conjunctiva are pink and non-injected, sclera clear LUNGS: clear to auscultation and percussion with normal breathing effort HEART: regular rate & rhythm and no murmurs and no lower extremity edema Musculoskeletal: no cyanosis of digits and no clubbing  PSYCH: alert & oriented x 3, fluent speech NEURO: no focal motor/sensory deficits  LABORATORY DATA:  I have reviewed the data as listed    Latest Ref Rng & Units 07/20/2023   10:51 AM 05/12/2023     2:32 AM 05/11/2023    3:55 PM  CBC  WBC 4.0 - 10.5 K/uL 5.0  11.0    Hemoglobin 13.0 - 17.0 g/dL 86.7  86.3  85.3   Hematocrit 39.0 - 52.0 % 40.3  40.5  43.0   Platelets 150 - 400 K/uL 186  171         Latest Ref Rng & Units 07/20/2023   10:51 AM 05/14/2023    3:02 AM 05/13/2023    2:49 AM  CMP  Glucose 70 - 99 mg/dL 68  865  864   BUN 8 - 23 mg/dL 18  18  13    Creatinine 0.61 - 1.24 mg/dL 9.14  9.09  9.22   Sodium 135 - 145 mmol/L 138  138  138   Potassium 3.5 - 5.1 mmol/L 4.3  3.8  3.8   Chloride 98 - 111 mmol/L 103  109  111   CO2 22 - 32 mmol/L 31  24  20    Calcium  8.9 - 10.3 mg/dL 89.7  9.2  9.1   Total Protein 6.5 - 8.1 g/dL 7.2     Total Bilirubin 0.0 - 1.2 mg/dL 0.6     Alkaline Phos 38 - 126 U/L 129     AST 15 - 41 U/L 21     ALT 0 - 44 U/L 25       RADIOGRAPHIC STUDIES: No results found.  ASSESSMENT & PLAN Keith Moreno 63 y.o. male with medical history significant for localized esophageal adenocarcinoma who presents for a follow up visit.   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 Stage IIB (cT2,cN0, cM0) --completed EUS and EBUS to finalize staging on 12/14/2021 --biopsy confirms moderate-poorly differentiated adenocarcinoma of the esophagus.  -- Received concurrent chemoradiation, started on 12/26/2021. End chemotherapy on 01/23/2022 (last dose on 01/30/2022 held due to neutropenia). Last radiation 02/02/2022. Plan:  --Labs today were reviewed and shows improvement. WBC 5.0, Hgb 13.2, MCV 87, Plt 186 --Patient will be due for his surveillance CT scan in April 2025.  Last scan in Oct 2024 showed no evidence of recurrent disease.  --RTC in 3 months for a follow up with CT scan.  #Hypotension--improved --Today's BP is 139/84 --Current BP medications include amlodipine  10 mg PO daily  --Advised to stay hydrated.   #Supportive Care -- chemotherapy education completed -- port placed, continue to flush q 6 weeks. Patient OK with  having port removed. Will place order today.  -- zofran  8mg  q8H PRN and compazine  10mg  PO q6H for nausea -- EMLA  cream for port -- no pain medication required at this time.   No orders of the defined types were placed in this encounter.   All questions were answered. The patient knows to call the clinic with any problems, questions or concerns.  I have spent a total of 30 minutes minutes of face-to-face and non-face-to-face time, preparing to see the patient, performing a medically appropriate examination, counseling and educating the patient, , communicating with other health care professionals, documenting clinical information in the electronic health record, and care coordination.   Norleen IVAR Kidney, MD Department of Hematology/Oncology [facility] Phone: [phone] Pager: [phone] Email: [email or web address]  07/22/2023 7:55 PM

## 2023-07-22 ENCOUNTER — Encounter: Payer: Self-pay | Admitting: Hematology and Oncology

## 2023-10-01 ENCOUNTER — Ambulatory Visit (HOSPITAL_COMMUNITY)
Admission: RE | Admit: 2023-10-01 | Discharge: 2023-10-01 | Disposition: A | Discharge status: Home / Self Care | Payer: No Typology Code available for payment source | Source: Ambulatory Visit | Attending: Hematology and Oncology | Admitting: Hematology and Oncology

## 2023-10-01 DIAGNOSIS — C159 Malignant neoplasm of esophagus, unspecified: Secondary | ICD-10-CM | POA: Insufficient documentation

## 2023-10-01 DIAGNOSIS — R1310 Dysphagia, unspecified: Secondary | ICD-10-CM | POA: Diagnosis present

## 2023-10-01 LAB — POCT I-STAT CREATININE: Creatinine, Ser: 1 mg/dL (ref 0.61–1.24)

## 2023-10-01 MED ORDER — SODIUM CHLORIDE (PF) 0.9 % IJ SOLN
INTRAMUSCULAR | Status: AC
  Filled 2023-10-01: qty 50

## 2023-10-01 MED ORDER — IOHEXOL 300 MG/ML  SOLN
100.0000 mL | Freq: Once | INTRAMUSCULAR | Status: AC | PRN
  Administered 2023-10-01: 100 mL via INTRAVENOUS

## 2023-10-12 ENCOUNTER — Telehealth: Payer: Self-pay | Admitting: *Deleted

## 2023-10-12 NOTE — Telephone Encounter (Signed)
 TCT patient regarding results of recent CT scan. Spoke with him.  Advised that his CT scan shows no evidence of residual/recurrent disease. We will see him in clinic later this month. Pt voiced understanding. He is aware of his appt this month.

## 2023-10-12 NOTE — Telephone Encounter (Signed)
-----   Message from Ulysees Barns IV sent at 10/12/2023  8:59 AM EDT ----- Please let Keith Moreno know his CT scan shows no evidence of residual/recurrent disease. We will see him in clinic later this month. ----- Message ----- From: Interface, Rad Results In Sent: 10/11/2023  10:55 PM EDT To: Jaci Standard, MD

## 2023-10-22 ENCOUNTER — Other Ambulatory Visit: Payer: Self-pay | Admitting: Physician Assistant

## 2023-10-22 DIAGNOSIS — C159 Malignant neoplasm of esophagus, unspecified: Secondary | ICD-10-CM

## 2023-10-23 ENCOUNTER — Inpatient Hospital Stay: Payer: No Typology Code available for payment source | Attending: Physician Assistant

## 2023-10-23 ENCOUNTER — Inpatient Hospital Stay: Payer: No Typology Code available for payment source | Admitting: Physician Assistant

## 2023-10-23 VITALS — BP 136/89 | HR 75 | Temp 98.1°F | Resp 17 | Wt 323.0 lb

## 2023-10-23 DIAGNOSIS — Z87442 Personal history of urinary calculi: Secondary | ICD-10-CM | POA: Diagnosis not present

## 2023-10-23 DIAGNOSIS — N200 Calculus of kidney: Secondary | ICD-10-CM | POA: Diagnosis not present

## 2023-10-23 DIAGNOSIS — Z833 Family history of diabetes mellitus: Secondary | ICD-10-CM | POA: Diagnosis not present

## 2023-10-23 DIAGNOSIS — Z923 Personal history of irradiation: Secondary | ICD-10-CM | POA: Diagnosis not present

## 2023-10-23 DIAGNOSIS — J90 Pleural effusion, not elsewhere classified: Secondary | ICD-10-CM | POA: Insufficient documentation

## 2023-10-23 DIAGNOSIS — I2510 Atherosclerotic heart disease of native coronary artery without angina pectoris: Secondary | ICD-10-CM | POA: Diagnosis not present

## 2023-10-23 DIAGNOSIS — Z79899 Other long term (current) drug therapy: Secondary | ICD-10-CM | POA: Diagnosis not present

## 2023-10-23 DIAGNOSIS — N281 Cyst of kidney, acquired: Secondary | ICD-10-CM | POA: Insufficient documentation

## 2023-10-23 DIAGNOSIS — Z9089 Acquired absence of other organs: Secondary | ICD-10-CM | POA: Insufficient documentation

## 2023-10-23 DIAGNOSIS — Z800 Family history of malignant neoplasm of digestive organs: Secondary | ICD-10-CM | POA: Diagnosis not present

## 2023-10-23 DIAGNOSIS — M47814 Spondylosis without myelopathy or radiculopathy, thoracic region: Secondary | ICD-10-CM | POA: Diagnosis not present

## 2023-10-23 DIAGNOSIS — Z8249 Family history of ischemic heart disease and other diseases of the circulatory system: Secondary | ICD-10-CM | POA: Diagnosis not present

## 2023-10-23 DIAGNOSIS — Z8616 Personal history of COVID-19: Secondary | ICD-10-CM | POA: Diagnosis not present

## 2023-10-23 DIAGNOSIS — C159 Malignant neoplasm of esophagus, unspecified: Secondary | ICD-10-CM

## 2023-10-23 DIAGNOSIS — C155 Malignant neoplasm of lower third of esophagus: Secondary | ICD-10-CM | POA: Diagnosis present

## 2023-10-23 DIAGNOSIS — R590 Localized enlarged lymph nodes: Secondary | ICD-10-CM | POA: Insufficient documentation

## 2023-10-23 DIAGNOSIS — Z8051 Family history of malignant neoplasm of kidney: Secondary | ICD-10-CM | POA: Insufficient documentation

## 2023-10-23 DIAGNOSIS — Z888 Allergy status to other drugs, medicaments and biological substances status: Secondary | ICD-10-CM | POA: Insufficient documentation

## 2023-10-23 DIAGNOSIS — F17290 Nicotine dependence, other tobacco product, uncomplicated: Secondary | ICD-10-CM | POA: Insufficient documentation

## 2023-10-23 DIAGNOSIS — Z7901 Long term (current) use of anticoagulants: Secondary | ICD-10-CM | POA: Diagnosis not present

## 2023-10-23 DIAGNOSIS — I700 Atherosclerosis of aorta: Secondary | ICD-10-CM | POA: Diagnosis not present

## 2023-10-23 DIAGNOSIS — K5730 Diverticulosis of large intestine without perforation or abscess without bleeding: Secondary | ICD-10-CM | POA: Diagnosis not present

## 2023-10-23 DIAGNOSIS — D869 Sarcoidosis, unspecified: Secondary | ICD-10-CM | POA: Insufficient documentation

## 2023-10-23 DIAGNOSIS — Z9049 Acquired absence of other specified parts of digestive tract: Secondary | ICD-10-CM | POA: Insufficient documentation

## 2023-10-23 DIAGNOSIS — Z9221 Personal history of antineoplastic chemotherapy: Secondary | ICD-10-CM | POA: Insufficient documentation

## 2023-10-23 DIAGNOSIS — Z818 Family history of other mental and behavioral disorders: Secondary | ICD-10-CM | POA: Diagnosis not present

## 2023-10-23 LAB — CBC WITH DIFFERENTIAL (CANCER CENTER ONLY)
Abs Immature Granulocytes: 0.02 10*3/uL (ref 0.00–0.07)
Basophils Absolute: 0 10*3/uL (ref 0.0–0.1)
Basophils Relative: 0 %
Eosinophils Absolute: 0.1 10*3/uL (ref 0.0–0.5)
Eosinophils Relative: 3 %
HCT: 41.3 % (ref 39.0–52.0)
Hemoglobin: 13.8 g/dL (ref 13.0–17.0)
Immature Granulocytes: 0 %
Lymphocytes Relative: 11 %
Lymphs Abs: 0.6 10*3/uL — ABNORMAL LOW (ref 0.7–4.0)
MCH: 29.1 pg (ref 26.0–34.0)
MCHC: 33.4 g/dL (ref 30.0–36.0)
MCV: 86.9 fL (ref 80.0–100.0)
Monocytes Absolute: 0.5 10*3/uL (ref 0.1–1.0)
Monocytes Relative: 9 %
Neutro Abs: 3.7 10*3/uL (ref 1.7–7.7)
Neutrophils Relative %: 77 %
Platelet Count: 179 10*3/uL (ref 150–400)
RBC: 4.75 MIL/uL (ref 4.22–5.81)
RDW: 14.6 % (ref 11.5–15.5)
WBC Count: 4.9 10*3/uL (ref 4.0–10.5)
nRBC: 0 % (ref 0.0–0.2)

## 2023-10-23 LAB — CMP (CANCER CENTER ONLY)
ALT: 25 U/L (ref 0–44)
AST: 21 U/L (ref 15–41)
Albumin: 4 g/dL (ref 3.5–5.0)
Alkaline Phosphatase: 123 U/L (ref 38–126)
Anion gap: 3 — ABNORMAL LOW (ref 5–15)
BUN: 25 mg/dL — ABNORMAL HIGH (ref 8–23)
CO2: 29 mmol/L (ref 22–32)
Calcium: 9.7 mg/dL (ref 8.9–10.3)
Chloride: 104 mmol/L (ref 98–111)
Creatinine: 0.95 mg/dL (ref 0.61–1.24)
GFR, Estimated: >60 mL/min (ref >60)
Glucose, Bld: 79 mg/dL (ref 70–99)
Potassium: 4.1 mmol/L (ref 3.5–5.1)
Sodium: 136 mmol/L (ref 135–145)
Total Bilirubin: 0.6 mg/dL (ref 0.0–1.2)
Total Protein: 6.9 g/dL (ref 6.5–8.1)

## 2023-10-23 NOTE — Progress Notes (Signed)
 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 Stage IIB (cT2,cN0, cM0) 11/13/2021: Presented to the emergency department complaining of dysphagia and food impaction. Underwent EGD with Dr. Rhea Belton. Findings revealed medium sized fungating mass in the lower third of th esophagus, 35 cm from the incisors and extending into the GE junction.   It appears to have arisen in the background of Barrett's esophagus, the mass was nonobstructing and partially circumferential. Pathology confirmed moderately to poorly differentiated adenocarcinoma.  11/30/2021: PET scan showed  hypermetabolic activity in the distal esophagus with an SUV of 7.7 with thickening measuring up to 15 mm and spanning just to above the GE junction.  Diffuse mediastinal adenopathy showed dense calcification in all nearly symmetric findings.  12/06/2021: Established care with Radiation Oncology with Dr. Dorothy Puffer and Laurence Aly PA-C 12/12/2021: Establish care with Medication Oncology with Dr. Jeanie Sewer and Georga Kaufmann PA-C 12/26/2021: Started Carbo/Paclitaxel with radiation. End chemotherapy on 01/23/2022 (last dose on 01/30/2022 held due to neutropenia). Last radiation 02/02/2022.  04/05/2022: Underwent Ivor-Lewis Esophagectomy. Pathology revealed focal residual invasive poorly differentiated adenocarcinoma into muscularis propria with tumor regression score of 1 of 3 (near complete response). 21 lymph nodes removed all negative for malignancy. Final pathology stage Is ypT2N0.   Interval History:  Keith Moreno 63 y.o. male with  medical history significant for localized esophageal adenocarcinoma who presents for a follow up visit. The patient's last visit was on 07/20/2023. In the interim since the last visit he has not been hospitalized.   On exam today Keith Moreno is accompanied by his wife. He reports his health has been about the same in interim since her last visit.  He is eating better but does have dysphagia with solids. He uses a feeding tube in the evening time mainly for liquids. His energy is slowly improving. He is continuing to loose weight, down another 10 lbs since January. He denies nausea, vomiting or bowel habit changes. He denies easy bruising or signs of active bleeding. He denies fevers, chills, shortness of breath, chest pain or cough. He has no other complaints.  A full 10 point ROS was otherwise negative.  MEDICAL HISTORY:  Past Medical History:  Diagnosis Date   Anxiety    Cancer (HCC)    Esophageal Cancer   COVID 2022   mild case   Dyspnea    r/t chemo and radiation   Headache    History of kidney stones    Hypertension    Kidney infection    at age 64   Pneumonia    63 years old   Pre-diabetes    PTSD (post-traumatic stress disorder)    per pt, if woken up suddenly he "cocks back arm" as if to punch but usually wakes up enough to come to before he hits anyone    SURGICAL HISTORY: Past Surgical History:  Procedure Laterality Date   BIOPSY  11/13/2021   Procedure: BIOPSY;  Surgeon: Beverley Fiedler, MD;  Location: [facility] ENDOSCOPY;  Service: Gastroenterology;;   BIOPSY  12/14/2021   Procedure: BIOPSY;  Surgeon: Lemar Lofty., MD;  Location: [facility] ENDOSCOPY;  Service: Gastroenterology;;   BRONCHIAL BRUSHINGS  12/14/2021   Procedure: BRONCHIAL BRUSHINGS;  Surgeon: Denson Flake, MD;  Location: [facility] ENDOSCOPY;  Service: Cardiopulmonary;;   BRONCHIAL NEEDLE ASPIRATION BIOPSY  12/14/2021   Procedure: BRONCHIAL NEEDLE ASPIRATION BIOPSIES;  Surgeon: Denson Flake, MD;  Location: [facility]  ENDOSCOPY;  Service: Cardiopulmonary;;   COLONOSCOPY  2018   CYST EXCISION  2022   left side of head   ESOPHAGEAL STENT PLACEMENT N/A 06/27/2022   Procedure: ESOPHAGEAL STENT REMOVAL;  Surgeon: Hilarie Lovely, MD;  Location: [facility] OR;  Service: Thoracic;  Laterality: N/A;   ESOPHAGEAL STENT PLACEMENT N/A 06/28/2022   Procedure: ESOPHAGEAL STENT PLACEMENT;  Surgeon: Hilarie Lovely, MD;  Location: [facility] OR;  Service: Thoracic;  Laterality: N/A;   ESOPHAGEAL STENT PLACEMENT N/A 08/10/2022   Procedure: ESOPHAGEAL STENT REMOVAL;  Surgeon: Hilarie Lovely, MD;  Location: [facility] OR;  Service: Thoracic;  Laterality: N/A;   ESOPHAGECTOMY  04/05/2022   ESOPHAGOGASTRODUODENOSCOPY N/A 11/13/2021   Procedure: ESOPHAGOGASTRODUODENOSCOPY (EGD);  Surgeon: Nannette Babe, MD;  Location: [facility] ENDOSCOPY;  Service: Gastroenterology;  Laterality: N/A;   ESOPHAGOGASTRODUODENOSCOPY N/A 04/05/2022   Procedure: ESOPHAGOGASTRODUODENOSCOPY (EGD);  Surgeon: Hilarie Lovely, MD;  Location: [facility] OR;  Service: Thoracic;  Laterality: N/A;   ESOPHAGOGASTRODUODENOSCOPY N/A 05/09/2022   Procedure: ESOPHAGOGASTRODUODENOSCOPY (EGD);  Surgeon: Hilarie Lovely, MD;  Location: [facility] OR;  Service: Thoracic;  Laterality: N/A;  esophageal stent placement   ESOPHAGOGASTRODUODENOSCOPY N/A 06/27/2022   Procedure: ESOPHAGOGASTRODUODENOSCOPY (EGD);  Surgeon: Hilarie Lovely, MD;  Location: [facility] OR;  Service: Thoracic;  Laterality: N/A;   ESOPHAGOGASTRODUODENOSCOPY N/A 06/28/2022   Procedure: ESOPHAGOGASTRODUODENOSCOPY (EGD);  Surgeon: Hilarie Lovely, MD;  Location: [facility] OR;  Service: Thoracic;  Laterality: N/A;   ESOPHAGOGASTRODUODENOSCOPY N/A 08/10/2022   Procedure: ESOPHAGOGASTRODUODENOSCOPY (EGD);  Surgeon: Hilarie Lovely, MD;  Location: [facility] OR;  Service: Thoracic;  Laterality: N/A;   ESOPHAGOGASTRODUODENOSCOPY (EGD) WITH PROPOFOL N/A 12/14/2021   Procedure: ESOPHAGOGASTRODUODENOSCOPY (EGD) WITH PROPOFOL;  Surgeon:  Normie Becton., MD;  Location: [facility] ENDOSCOPY;  Service: Gastroenterology;  Laterality: N/A;   EUS N/A 12/14/2021   Procedure: UPPER ENDOSCOPIC ULTRASOUND (EUS) LINEAR;  Surgeon: Normie Becton., MD;  Location: [facility] ENDOSCOPY;  Service: Gastroenterology;  Laterality: N/A;   FINE NEEDLE ASPIRATION  12/14/2021   Procedure: FINE NEEDLE ASPIRATION (FNA) LINEAR;  Surgeon: Normie Becton., MD;  Location: [facility] ENDOSCOPY;  Service: Gastroenterology;;   FOREIGN BODY REMOVAL  11/13/2021   Procedure: FOREIGN BODY REMOVAL;  Surgeon: Nannette Babe, MD;  Location: [facility] ENDOSCOPY;  Service: Gastroenterology;;   HUMERUS FRACTURE SURGERY Right    INTERCOSTAL NERVE BLOCK  04/05/2022   Procedure: INTERCOSTAL NERVE BLOCK;  Surgeon: Hilarie Lovely, MD;  Location: [facility] OR;  Service: Thoracic;;   IR IMAGING GUIDED PORT INSERTION  12/23/2021   IR REMOVAL TUN ACCESS W/ PORT W/O FL MOD SED  05/04/2023   IR REPLC DUODEN/JEJUNO TUBE PERCUT W/FLUORO  10/12/2022   IR THORACENTESIS ASP PLEURAL SPACE W/IMG GUIDE  04/19/2022   LAPAROSCOPIC JEJUNOSTOMY N/A 05/09/2022   Procedure: LAPAROSCOPIC JEJUNOSTOMY;  Surgeon: Hilarie Lovely, MD;  Location: [facility] OR;  Service: Thoracic;  Laterality: N/A;   MULTIPLE TOOTH EXTRACTIONS  2015   NODE DISSECTION  04/05/2022   Procedure: NODE DISSECTION;  Surgeon: Hilarie Lovely, MD;  Location: [facility] OR;  Service: Thoracic;;   TONSILLECTOMY AND ADENOIDECTOMY     as a child   VIDEO ASSISTED THORACOSCOPY Right 05/09/2022   Procedure: VIDEO ASSISTED THORACOSCOPY;  Surgeon: Hilarie Lovely, MD;  Location:  [facility] OR;  Service: Thoracic;  Laterality: Right;   VIDEO BRONCHOSCOPY WITH ENDOBRONCHIAL ULTRASOUND Bilateral 12/14/2021   Procedure: VIDEO BRONCHOSCOPY WITH ENDOBRONCHIAL ULTRASOUND;  Surgeon: Denson Flake, MD;  Location: [facility] ENDOSCOPY;  Service: Cardiopulmonary;  Laterality: Bilateral;    SOCIAL HISTORY: Social History   Socioeconomic History   Marital status:  Single    Spouse name: Not on file   Number of children: Not on file   Years of education: Not on file   Highest education level: Not on file  Occupational History   Not on file  Tobacco Use   Smoking status: Former    Current packs/day: 0.00    Types: Cigars, Cigarettes, Pipe    Quit date: 19    Years since quitting: 42.3   Smokeless tobacco: Former    Types: Chew    Quit date: 1983  Vaping Use   Vaping status: Never Used  Substance and Sexual Activity   Alcohol use: Not Currently    Comment: Rarely   Drug use: No   Sexual activity: Yes  Other Topics Concern   Not on file  Social History Narrative   Not on file   Social Drivers of Health   Financial Resource Strain: Not on file  Food Insecurity: No Food Insecurity (05/11/2023)   Hunger Vital Sign    Worried About Running Out of Food in the Last Year: Never true    Ran Out of Food in the Last Year: Never true  Transportation Needs: No Transportation Needs (05/11/2023)   PRAPARE - Administrator, Civil Service (Medical): No    Lack of Transportation (Non-Medical): No  Physical Activity: Not on file  Stress: Not on file  Social Connections: Unknown (11/14/2022)   [facility] from [facility]   Social Network    Social Network: Not on file  Intimate Partner Violence: Not At Risk (05/11/2023)   Humiliation, Afraid, Rape, and Kick questionnaire    Fear of Current or Ex-Partner: No    Emotionally Abused: No    Physically Abused: No    Sexually Abused: No    FAMILY HISTORY: Family History  Problem Relation Age of Onset   Hypertension Mother    Diabetes Mother    Bipolar disorder Mother    Hypertension Father    Kidney cancer Sister    Colon cancer Paternal Uncle    Cancer Cousin    Bladder Cancer Cousin    Cancer Cousin     ALLERGIES:  is allergic to procaine, chocolate, lisinopril, and other.  MEDICATIONS:  Current Outpatient Medications  Medication Sig Dispense Refill    acetaminophen (TYLENOL) 500 MG tablet Take 2 tablets (1,000 mg total) by mouth every 6 (six) hours as needed for mild pain or fever. (Patient taking differently: Take 500 mg by mouth every 6 (six) hours as needed for mild pain (pain score 1-3) or fever.) 30 tablet 0   amLODipine (NORVASC) 10 MG tablet Take 10 mg by mouth daily.     apixaban (ELIQUIS) 2.5 MG TABS tablet Take 2.5 mg by mouth 2 (two) times daily.     benzonatate (TESSALON) 100 MG capsule Take 100 mg by mouth daily as needed for cough.     DOCUSATE SODIUM PO Take 2-3 capsules by mouth at bedtime.     famotidine (PEPCID) 20 MG tablet Take 1 tablet (20 mg total) by mouth 2 (two) times daily. 60 tablet 0   lansoprazole (PREVACID SOLUTAB) 30 MG disintegrating tablet Take  30 mg by mouth 2 (two) times daily before a meal. Dissolve 1 tablet in mouth twice a day before meals (take on an empty stomach 30 minutes prior to a meal)     lidocaine (LIDODERM) 5 % Place 1 patch onto the skin See admin instructions. Apply 1 patch to the skin once daily for back pain (apply for 12 hours, then remove for 12 hours) as needed     Melatonin 10 MG TABS Take 10 mg by mouth at bedtime.     Multiple Vitamins-Minerals (MULTIVITAMINS/MINERALS ADULT PO) Take 1 each by mouth in the morning. Gummies     Nutritional Supplements (FEEDING SUPPLEMENT, OSMOLITE 1.5 CAL,) LIQD Place 1,000 mLs into feeding tube See admin instructions. Take 1 can by gastric tube every 3 hours     ondansetron (ZOFRAN-ODT) 8 MG disintegrating tablet Take 1 tablet (8 mg total) by mouth every 8 (eight) hours as needed for nausea or vomiting. 90 tablet 3   pioglitazone (ACTOS) 30 MG tablet Take 30 mg by mouth daily.     sertraline (ZOLOFT) 25 MG tablet Take 1 tablet (25 mg total) by mouth daily. 30 tablet 0   sitaGLIPtin (JANUVIA) 50 MG tablet Take 50 mg by mouth at bedtime.     No current facility-administered medications for this visit.    REVIEW OF SYSTEMS:   Constitutional: ( - ) fevers, (  - )  chills , ( - ) night sweats Eyes: ( - ) blurriness of vision, ( - ) double vision, ( - ) watery eyes Ears, nose, mouth, throat, and face: ( - ) mucositis, ( - ) sore throat Respiratory: ( - ) cough, ( - ) dyspnea, ( - ) wheezes Cardiovascular: ( - ) palpitation, ( - ) chest discomfort, ( - ) lower extremity swelling Gastrointestinal:  ( - ) nausea, ( - ) heartburn, ( - ) change in bowel habits Skin: ( - ) abnormal skin rashes Lymphatics: ( - ) new lymphadenopathy, ( - ) easy bruising Neurological: ( - ) numbness, ( - ) tingling, ( - ) new weaknesses Behavioral/Psych: ( - ) mood change, ( - ) new changes  All other systems were reviewed with the patient and are negative.  PHYSICAL EXAMINATION: ECOG PERFORMANCE STATUS: 1 - Symptomatic but completely ambulatory  Vitals:   10/23/23 1312  BP: 136/89  Pulse: 75  Resp: 17  Temp: 98.1 F (36.7 C)  SpO2: 96%    Filed Weights   10/23/23 1312  Weight: (!) 323 lb (146.5 kg)     GENERAL: Well-appearing elderly Caucasian male, alert, no distress and comfortable SKIN: skin color, texture, turgor are normal, no rashes or significant lesions EYES: conjunctiva are pink and non-injected, sclera clear LUNGS: clear to auscultation and percussion with normal breathing effort HEART: regular rate & rhythm and no murmurs and no lower extremity edema Musculoskeletal: no cyanosis of digits and no clubbing  PSYCH: alert & oriented x 3, fluent speech NEURO: no focal motor/sensory deficits  LABORATORY DATA:  I have reviewed the data as listed    Latest Ref Rng & Units 10/23/2023   12:04 PM 07/20/2023   10:51 AM 05/12/2023    2:32 AM  CBC  WBC 4.0 - 10.5 K/uL 4.9  5.0  11.0   Hemoglobin 13.0 - 17.0 g/dL 16.1  09.6  04.5   Hematocrit 39.0 - 52.0 % 41.3  40.3  40.5   Platelets 150 - 400 K/uL 179  186  171  Latest Ref Rng & Units 10/23/2023   12:04 PM 10/01/2023    1:51 PM 07/20/2023   10:51 AM  CMP  Glucose 70 - 99 mg/dL 79   68    BUN 8 - 23 mg/dL 25   18   Creatinine 9.14 - 1.24 mg/dL 7.82  9.56  2.13   Sodium 135 - 145 mmol/L 136   138   Potassium 3.5 - 5.1 mmol/L 4.1   4.3   Chloride 98 - 111 mmol/L 104   103   CO2 22 - 32 mmol/L 29   31   Calcium 8.9 - 10.3 mg/dL 9.7   08.6   Total Protein 6.5 - 8.1 g/dL 6.9   7.2   Total Bilirubin 0.0 - 1.2 mg/dL 0.6   0.6   Alkaline Phos 38 - 126 U/L 123   129   AST 15 - 41 U/L 21   21   ALT 0 - 44 U/L 25   25     RADIOGRAPHIC STUDIES: CT CHEST ABDOMEN PELVIS W CONTRAST Result Date: 10/11/2023 CLINICAL DATA:  Follow-up esophageal cancer EXAM: CT CHEST, ABDOMEN, AND PELVIS WITH CONTRAST TECHNIQUE: Multidetector CT imaging of the chest, abdomen and pelvis was performed following the standard protocol during bolus administration of intravenous contrast. RADIATION DOSE REDUCTION: This exam was performed according to the departmental dose-optimization program which includes automated exposure control, adjustment of the mA and/or kV according to patient size and/or use of iterative reconstruction technique. CONTRAST:  OMNIPAQUE IOHEXOL 300 MG/ML  SOLN COMPARISON:  CTA chest abdomen pelvis dated 05/11/2023 FINDINGS: CT CHEST FINDINGS Cardiovascular: The heart is normal in size. No pericardial effusion. No evidence of thoracic aortic aneurysm. Atherosclerotic calcifications of the aortic arch. Mild coronary atherosclerosis of the LAD. Mediastinum/Nodes: Status post esophagectomy with gastric pull-through. Numerous mediastinal and bilateral hilar nodes, most of which are calcified, unchanged across multiple prior studies. This appearance favors granulomatous disease such as sarcoidosis. Visualized thyroid is unremarkable. Lungs/Pleura: Mild perifissural nodularity in the right upper lobe (series 4/image 61), unchanged from priors, favoring sarcoidosis. No new/suspicious pulmonary nodules. Right middle lobe atelectasis/collapse with air bronchograms, chronic. Trace left pleural effusion,  chronic. Mild pleural thickening in the right hemithorax, improved. No pneumothorax. Musculoskeletal: Degenerative changes of the lower thoracic spine. CT ABDOMEN PELVIS FINDINGS Hepatobiliary: Liver is within normal limits. No suspicious/enhancing hepatic lesions. Gallbladder is unremarkable. No intrahepatic or extrahepatic ductal dilatation. Pancreas: Within normal limits. Spleen: Within normal limits. Adrenals/Urinary Tract: Adrenal glands are within normal limits. 1.9 cm simple left lower pole renal cyst (series 2/image 39), benign (Bosniak I). No follow-up is recommended. At least 3 nonobstructing left renal calculi measuring up to 7 mm in the left lower pole (series 2/image 78). At least 5 nonobstructing right renal calculi measuring up to 11 mm in the right lower pole (series 2/image 81). No hydronephrosis. Bladder is within normal limits. Stomach/Bowel: Status post gastric pull-through, as above. No evidence of bowel obstruction. Suspected percutaneous jejunostomy. Normal appendix (series 2/image 99). Extensive colonic diverticulosis, without evidence of diverticulitis. Vascular/Lymphatic: No evidence of abdominal aortic aneurysm. No suspicious abdominopelvic lymphadenopathy. Reproductive: Prostate is notable for dystrophic calcifications. Other: No abdominopelvic ascites. Musculoskeletal: Lumbar spine is within normal limits. IMPRESSION: Status post esophagectomy with gastric pull-through. No evidence of recurrent or metastatic disease. Stable findings of sarcoidosis, as above. Additional ancillary findings as above. Electronically Signed   By: Zadie Herter M.D.   On: 10/11/2023 22:53    ASSESSMENT & PLAN Keith Moreno is  a 63 y.o. male with medical history significant for localized esophageal adenocarcinoma who presents for a follow up visit.   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 Stage IIB (cT2,cN0, cM0) --completed EUS and EBUS to finalize staging on  12/14/2021 --biopsy confirms moderate-poorly differentiated adenocarcinoma of the esophagus.  -- Received concurrent chemoradiation, started on 12/26/2021. End chemotherapy on 01/23/2022 (last dose on 01/30/2022 held due to neutropenia). Last radiation 02/02/2022. --Underwent Ivor-Lewis Esophagectomy on 04/05/2022. Pathology revealed focal residual invasive poorly differentiated adenocarcinoma into muscularis propria with tumor regression score of 1 of 3 (near complete response). 21 lymph nodes removed all negative for malignancy. Final pathology stage Is ypT2N0.  Plan:  --Labs today were reviewed. WBC 4.9, Hgb 13.8, MCV 86.9, Plt 179 --Most recent CT CAP from 10/01/2023 showed s/p esophagectomy with gastric pull-through. No evidence of recurrence or metastatic disease. Next CT scan will be due around September 2025.  --Discussed NCCN guidelines for labs and visit every 3-6 months and CT scan every 6 months for first 2 years then transition to labs and visit every 6 months and CT scans annually.  --RTC in 3 months for a follow up with labs  #Hypoglycemia: --Glucose was 79 today, 68 in January 2025.  --Patient denies fasting prior to blood work. --Advised to follow up with PCP to consider holding and modifying diabetic medication.   #Hypotension--improved --Today's BP is 136/89 --Current BP medications include amlodipine 10 mg PO daily  --Advised to stay hydrated.   #Supportive Care -- chemotherapy education completed -- port placed, continue to flush q 6 weeks. Patient OK with having port removed. Will place order today.  -- zofran 8mg  q8H PRN and compazine 10mg  PO q6H for nausea -- EMLA cream for port -- no pain medication required at this time.   No orders of the defined types were placed in this encounter.   All questions were answered. The patient knows to call the clinic with any problems, questions or concerns.  I have spent a total of 30 minutes minutes of face-to-face and  non-face-to-face time, preparing to see the patient, performing a medically appropriate examination, counseling and educating the patient, , communicating with other health care professionals, documenting clinical information in the electronic health record, and care coordination.   Wyline Hearing PA-C Dept of Hematology and Oncology [facility] Phone: [phone]   10/23/2023 9:49 PM

## 2024-01-23 ENCOUNTER — Other Ambulatory Visit: Payer: Self-pay | Admitting: Hematology and Oncology

## 2024-01-23 ENCOUNTER — Inpatient Hospital Stay: Admitting: Hematology and Oncology

## 2024-01-23 ENCOUNTER — Inpatient Hospital Stay: Attending: Physician Assistant

## 2024-01-23 VITALS — BP 131/72 | HR 73 | Temp 98.1°F | Resp 15 | Wt 323.2 lb

## 2024-01-23 DIAGNOSIS — Z9089 Acquired absence of other organs: Secondary | ICD-10-CM | POA: Diagnosis not present

## 2024-01-23 DIAGNOSIS — Z923 Personal history of irradiation: Secondary | ICD-10-CM | POA: Insufficient documentation

## 2024-01-23 DIAGNOSIS — Z9221 Personal history of antineoplastic chemotherapy: Secondary | ICD-10-CM | POA: Diagnosis not present

## 2024-01-23 DIAGNOSIS — Z8616 Personal history of COVID-19: Secondary | ICD-10-CM | POA: Insufficient documentation

## 2024-01-23 DIAGNOSIS — Z8051 Family history of malignant neoplasm of kidney: Secondary | ICD-10-CM | POA: Insufficient documentation

## 2024-01-23 DIAGNOSIS — Z8052 Family history of malignant neoplasm of bladder: Secondary | ICD-10-CM | POA: Insufficient documentation

## 2024-01-23 DIAGNOSIS — Z7901 Long term (current) use of anticoagulants: Secondary | ICD-10-CM | POA: Insufficient documentation

## 2024-01-23 DIAGNOSIS — Z9049 Acquired absence of other specified parts of digestive tract: Secondary | ICD-10-CM | POA: Insufficient documentation

## 2024-01-23 DIAGNOSIS — C159 Malignant neoplasm of esophagus, unspecified: Secondary | ICD-10-CM | POA: Diagnosis not present

## 2024-01-23 DIAGNOSIS — Z79899 Other long term (current) drug therapy: Secondary | ICD-10-CM | POA: Diagnosis not present

## 2024-01-23 DIAGNOSIS — R590 Localized enlarged lymph nodes: Secondary | ICD-10-CM | POA: Insufficient documentation

## 2024-01-23 DIAGNOSIS — Z87442 Personal history of urinary calculi: Secondary | ICD-10-CM | POA: Diagnosis not present

## 2024-01-23 DIAGNOSIS — Z8249 Family history of ischemic heart disease and other diseases of the circulatory system: Secondary | ICD-10-CM | POA: Diagnosis not present

## 2024-01-23 DIAGNOSIS — C155 Malignant neoplasm of lower third of esophagus: Secondary | ICD-10-CM | POA: Insufficient documentation

## 2024-01-23 DIAGNOSIS — Z818 Family history of other mental and behavioral disorders: Secondary | ICD-10-CM | POA: Insufficient documentation

## 2024-01-23 DIAGNOSIS — Z95828 Presence of other vascular implants and grafts: Secondary | ICD-10-CM | POA: Diagnosis not present

## 2024-01-23 DIAGNOSIS — Z888 Allergy status to other drugs, medicaments and biological substances status: Secondary | ICD-10-CM | POA: Diagnosis not present

## 2024-01-23 DIAGNOSIS — Z833 Family history of diabetes mellitus: Secondary | ICD-10-CM | POA: Insufficient documentation

## 2024-01-23 DIAGNOSIS — I10 Essential (primary) hypertension: Secondary | ICD-10-CM | POA: Diagnosis not present

## 2024-01-23 DIAGNOSIS — Z800 Family history of malignant neoplasm of digestive organs: Secondary | ICD-10-CM | POA: Insufficient documentation

## 2024-01-23 LAB — CBC WITH DIFFERENTIAL (CANCER CENTER ONLY)
Abs Immature Granulocytes: 0.02 K/uL (ref 0.00–0.07)
Basophils Absolute: 0 K/uL (ref 0.0–0.1)
Basophils Relative: 0 %
Eosinophils Absolute: 0.1 K/uL (ref 0.0–0.5)
Eosinophils Relative: 3 %
HCT: 40 % (ref 39.0–52.0)
Hemoglobin: 13.3 g/dL (ref 13.0–17.0)
Immature Granulocytes: 0 %
Lymphocytes Relative: 10 %
Lymphs Abs: 0.5 K/uL — ABNORMAL LOW (ref 0.7–4.0)
MCH: 29.3 pg (ref 26.0–34.0)
MCHC: 33.3 g/dL (ref 30.0–36.0)
MCV: 88.1 fL (ref 80.0–100.0)
Monocytes Absolute: 0.4 K/uL (ref 0.1–1.0)
Monocytes Relative: 8 %
Neutro Abs: 3.7 K/uL (ref 1.7–7.7)
Neutrophils Relative %: 79 %
Platelet Count: 169 K/uL (ref 150–400)
RBC: 4.54 MIL/uL (ref 4.22–5.81)
RDW: 14.1 % (ref 11.5–15.5)
WBC Count: 4.7 K/uL (ref 4.0–10.5)
nRBC: 0 % (ref 0.0–0.2)

## 2024-01-23 LAB — CMP (CANCER CENTER ONLY)
ALT: 21 U/L (ref 0–44)
AST: 20 U/L (ref 15–41)
Albumin: 4 g/dL (ref 3.5–5.0)
Alkaline Phosphatase: 109 U/L (ref 38–126)
Anion gap: 3 — ABNORMAL LOW (ref 5–15)
BUN: 18 mg/dL (ref 8–23)
CO2: 28 mmol/L (ref 22–32)
Calcium: 9.5 mg/dL (ref 8.9–10.3)
Chloride: 106 mmol/L (ref 98–111)
Creatinine: 0.88 mg/dL (ref 0.61–1.24)
GFR, Estimated: >60 mL/min (ref >60)
Glucose, Bld: 71 mg/dL (ref 70–99)
Potassium: 4.3 mmol/L (ref 3.5–5.1)
Sodium: 137 mmol/L (ref 135–145)
Total Bilirubin: 0.6 mg/dL (ref 0.0–1.2)
Total Protein: 6.9 g/dL (ref 6.5–8.1)

## 2024-01-23 NOTE — Progress Notes (Signed)
 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 Stage IIB (cT2,cN0, cM0) 11/13/2021: Presented to the emergency department complaining of dysphagia and food impaction. Underwent EGD with Dr. Albertus. Findings revealed medium sized fungating mass in the lower third of th esophagus, 35 cm from the incisors and extending into the GE junction.   It appears to have arisen in the background of Barrett's esophagus, the mass was nonobstructing and partially circumferential. Pathology confirmed moderately to poorly differentiated adenocarcinoma.  11/30/2021: PET scan showed  hypermetabolic activity in the distal esophagus with an SUV of 7.7 with thickening measuring up to 15 mm and spanning just to above the GE junction.  Diffuse mediastinal adenopathy showed dense calcification in all nearly symmetric findings.  12/06/2021: Established care with Radiation Oncology with Dr. Norleen Limes and Donald Husband PA-C 12/12/2021: Establish care with Medication Oncology with Dr. Norleen Federico and Johnston Neomi PA-C 12/26/2021: Started Carbo/Paclitaxel  with radiation. End chemotherapy on 01/23/2022 (last dose on 01/30/2022 held due to neutropenia). Last radiation 02/02/2022.  04/05/2022: Underwent Ivor-Lewis Esophagectomy. Pathology revealed focal residual invasive poorly differentiated adenocarcinoma into muscularis propria with tumor regression score of 1 of 3 (near complete response). 21 lymph nodes removed all negative for malignancy. Final pathology stage Is ypT2N0.   Interval History:  Keith Moreno 63 y.o. male with  medical history significant for localized esophageal adenocarcinoma who presents for a follow up visit. The patient's last visit was on 10/23/2023. In the interim since the last visit he has not been hospitalized.   On exam today Mr. Keith Moreno is accompanied by his wife. He reports he has been not bad in the interim since our last visit.  He reports that his appetite remains good but he still having some difficulty with liquids.  He reports that it is improving however.  He notes that water  still gives him difficulty but not milk.  He notes that he does not drink juice because of his blood sugars.  He reports he is eating meat and other solid foods without difficulty.  He notes that he does get uncomfortable in his chest sometimes.  He notes that his J-tube also feels like it may have been getting loose.  He notes he is had no recent issues with nausea, vomiting, or diarrhea.  He denies any fevers, chills, sweats.  Full 10 point ROS is otherwise negative.  MEDICAL HISTORY:  Past Medical History:  Diagnosis Date   Anxiety    Cancer (HCC)    Esophageal Cancer   COVID 2022   mild case   Dyspnea    r/t chemo and radiation   Headache    History of kidney stones    Hypertension    Kidney infection    at age 8   Pneumonia    63 years old   Pre-diabetes    PTSD (post-traumatic stress disorder)    per pt, if woken up suddenly he cocks back arm as if to punch but usually wakes up enough to come to before he hits anyone    SURGICAL HISTORY: Past Surgical History:  Procedure Laterality Date   BIOPSY  11/13/2021   Procedure: BIOPSY;  Surgeon: Albertus,  Gordy HERO, MD;  Location: [facility] ENDOSCOPY;  Service: Gastroenterology;;   BIOPSY  12/14/2021   Procedure: BIOPSY;  Surgeon: Wilhelmenia Aloha Raddle., MD;  Location: [facility] ENDOSCOPY;  Service: Gastroenterology;;   BRONCHIAL BRUSHINGS  12/14/2021   Procedure: BRONCHIAL BRUSHINGS;  Surgeon: Shelah Lamar RAMAN, MD;  Location: [facility] ENDOSCOPY;  Service:  Cardiopulmonary;;   BRONCHIAL NEEDLE ASPIRATION BIOPSY  12/14/2021   Procedure: BRONCHIAL NEEDLE ASPIRATION BIOPSIES;  Surgeon: Shelah Lamar RAMAN, MD;  Location: [facility] ENDOSCOPY;  Service: Cardiopulmonary;;   COLONOSCOPY  2018   CYST EXCISION  2022   left side of head   ESOPHAGEAL STENT PLACEMENT N/A 06/27/2022   Procedure: ESOPHAGEAL STENT REMOVAL;  Surgeon: Shyrl Linnie KIDD, MD;  Location: [facility] OR;  Service: Thoracic;  Laterality: N/A;   ESOPHAGEAL STENT PLACEMENT N/A 06/28/2022   Procedure: ESOPHAGEAL STENT PLACEMENT;  Surgeon: Shyrl Linnie KIDD, MD;  Location: [facility] OR;  Service: Thoracic;  Laterality: N/A;   ESOPHAGEAL STENT PLACEMENT N/A 08/10/2022   Procedure: ESOPHAGEAL STENT REMOVAL;  Surgeon: Shyrl Linnie KIDD, MD;  Location: [facility] OR;  Service: Thoracic;  Laterality: N/A;   ESOPHAGECTOMY  04/05/2022   ESOPHAGOGASTRODUODENOSCOPY N/A 11/13/2021   Procedure: ESOPHAGOGASTRODUODENOSCOPY (EGD);  Surgeon: Albertus Gordy HERO, MD;  Location: [facility] ENDOSCOPY;  Service: Gastroenterology;  Laterality: N/A;   ESOPHAGOGASTRODUODENOSCOPY N/A 04/05/2022   Procedure: ESOPHAGOGASTRODUODENOSCOPY (EGD);  Surgeon: Shyrl Linnie KIDD, MD;  Location: [facility] OR;  Service: Thoracic;  Laterality: N/A;   ESOPHAGOGASTRODUODENOSCOPY N/A 05/09/2022   Procedure: ESOPHAGOGASTRODUODENOSCOPY (EGD);  Surgeon: Shyrl Linnie KIDD, MD;  Location: [facility] OR;  Service: Thoracic;  Laterality: N/A;  esophageal stent placement   ESOPHAGOGASTRODUODENOSCOPY N/A 06/27/2022   Procedure: ESOPHAGOGASTRODUODENOSCOPY (EGD);  Surgeon: Shyrl Linnie KIDD, MD;  Location: [facility] OR;  Service: Thoracic;  Laterality: N/A;   ESOPHAGOGASTRODUODENOSCOPY N/A 06/28/2022   Procedure: ESOPHAGOGASTRODUODENOSCOPY (EGD);  Surgeon: Shyrl Linnie KIDD, MD;  Location: [facility] OR;  Service: Thoracic;  Laterality: N/A;   ESOPHAGOGASTRODUODENOSCOPY N/A 08/10/2022   Procedure: ESOPHAGOGASTRODUODENOSCOPY (EGD);  Surgeon: Shyrl Linnie KIDD, MD;  Location: [facility] OR;  Service:  Thoracic;  Laterality: N/A;   ESOPHAGOGASTRODUODENOSCOPY (EGD) WITH PROPOFOL  N/A 12/14/2021   Procedure: ESOPHAGOGASTRODUODENOSCOPY (EGD) WITH PROPOFOL ;  Surgeon: Wilhelmenia Aloha Raddle., MD;  Location: [facility] ENDOSCOPY;  Service: Gastroenterology;  Laterality: N/A;   EUS N/A 12/14/2021   Procedure: UPPER ENDOSCOPIC ULTRASOUND (EUS) LINEAR;  Surgeon: Wilhelmenia Aloha Raddle., MD;  Location: [facility] ENDOSCOPY;  Service: Gastroenterology;  Laterality: N/A;   FINE NEEDLE ASPIRATION  12/14/2021   Procedure: FINE NEEDLE ASPIRATION (FNA) LINEAR;  Surgeon: Wilhelmenia Aloha Raddle., MD;  Location: [facility] ENDOSCOPY;  Service: Gastroenterology;;   FOREIGN BODY REMOVAL  11/13/2021   Procedure: FOREIGN BODY REMOVAL;  Surgeon: Albertus Gordy HERO, MD;  Location: [facility] ENDOSCOPY;  Service: Gastroenterology;;   HUMERUS FRACTURE SURGERY Right    INTERCOSTAL NERVE BLOCK  04/05/2022   Procedure: INTERCOSTAL NERVE BLOCK;  Surgeon: Shyrl Linnie KIDD, MD;  Location: [facility] OR;  Service: Thoracic;;   IR IMAGING GUIDED PORT INSERTION  12/23/2021   IR REMOVAL TUN ACCESS W/ PORT W/O FL MOD SED  05/04/2023   IR REPLC DUODEN/JEJUNO TUBE PERCUT W/FLUORO  10/12/2022   IR THORACENTESIS ASP PLEURAL SPACE W/IMG GUIDE  04/19/2022   LAPAROSCOPIC JEJUNOSTOMY N/A 05/09/2022   Procedure: LAPAROSCOPIC JEJUNOSTOMY;  Surgeon: Shyrl Linnie KIDD, MD;  Location: [facility] OR;  Service: Thoracic;  Laterality: N/A;   MULTIPLE TOOTH EXTRACTIONS  2015   NODE DISSECTION  04/05/2022   Procedure: NODE DISSECTION;  Surgeon: Shyrl Linnie KIDD, MD;  Location: [facility]  OR;  Service: Thoracic;;   TONSILLECTOMY AND ADENOIDECTOMY     as a child   VIDEO ASSISTED THORACOSCOPY Right 05/09/2022   Procedure: VIDEO ASSISTED THORACOSCOPY;  Surgeon: Shyrl Linnie KIDD, MD;  Location: [facility] OR;  Service: Thoracic;  Laterality: Right;   VIDEO BRONCHOSCOPY WITH ENDOBRONCHIAL ULTRASOUND Bilateral 12/14/2021   Procedure: VIDEO BRONCHOSCOPY WITH ENDOBRONCHIAL ULTRASOUND;  Surgeon: Shelah Lamar RAMAN, MD;  Location: [facility] ENDOSCOPY;  Service: Cardiopulmonary;  Laterality: Bilateral;    SOCIAL HISTORY: Social History   Socioeconomic History   Marital status: Single    Spouse name: Not on file   Number of children: Not on file   Years of education: Not on file   Highest education level: Not on file  Occupational History   Not on file  Tobacco Use   Smoking status: Former    Current packs/day: 0.00    Types: Cigars, Cigarettes, Pipe    Quit date: 17    Years since quitting: 42.5   Smokeless tobacco: Former    Types: Chew    Quit date: 1983  Vaping Use   Vaping status: Never Used  Substance and Sexual Activity   Alcohol use: Not Currently    Comment: Rarely   Drug use: No   Sexual activity: Yes  Other Topics Concern   Not on file  Social History Narrative   Not on file   Social Drivers of Health   Financial Resource Strain: Not on file  Food Insecurity: No Food Insecurity (05/11/2023)   Hunger Vital Sign    Worried About Running Out of Food in the Last Year: Never true    Ran Out of Food in the Last Year: Never true  Transportation Needs: No Transportation Needs (05/11/2023)   PRAPARE - Administrator, Civil Service (Medical): No    Lack of Transportation (Non-Medical): No  Physical Activity: Not on file  Stress: Not on file  Social Connections: Unknown (11/14/2022)   Received from [facility]   Social Network    Social Network: Not on file  Intimate Partner Violence: Not At Risk (05/11/2023)   Humiliation, Afraid, Rape, and Kick questionnaire    Fear of Current or Ex-Partner: No    Emotionally Abused: No    Physically Abused: No    Sexually Abused: No    FAMILY HISTORY: Family History  Problem Relation Age of Onset   Hypertension Mother    Diabetes Mother    Bipolar disorder Mother    Hypertension Father    Kidney cancer Sister    Colon cancer Paternal Uncle    Cancer Cousin    Bladder Cancer Cousin    Cancer Cousin     ALLERGIES:   is allergic to procaine, chocolate, lisinopril, and other.  MEDICATIONS:  Current Outpatient Medications  Medication Sig Dispense Refill   acetaminophen  (TYLENOL ) 500 MG tablet Take 2 tablets (1,000 mg total) by mouth every 6 (six) hours as needed for mild pain or fever. (Patient taking differently: Take 500 mg by mouth every 6 (six) hours as needed for mild pain (pain score 1-3) or fever.) 30 tablet 0   amLODipine  (NORVASC ) 10 MG tablet Take 10 mg by mouth daily.     apixaban  (ELIQUIS ) 2.5 MG TABS tablet Take 1 tablet (2.5 mg total) by mouth 2 (two) times daily. 180 tablet 3   benzonatate (TESSALON) 100 MG capsule Take 100 mg by mouth daily as needed for cough.     DOCUSATE SODIUM  PO Take 2-3  capsules by mouth at bedtime.     famotidine  (PEPCID ) 20 MG tablet Take 1 tablet (20 mg total) by mouth 2 (two) times daily. 60 tablet 0   lansoprazole  (PREVACID  SOLUTAB) 30 MG disintegrating tablet Take 30 mg by mouth 2 (two) times daily before a meal. Dissolve 1 tablet in mouth twice a day before meals (take on an empty stomach 30 minutes prior to a meal)     lidocaine  (LIDODERM ) 5 % Place 1 patch onto the skin See admin instructions. Apply 1 patch to the skin once daily for back pain (apply for 12 hours, then remove for 12 hours) as needed     Melatonin 10 MG TABS Take 10 mg by mouth at bedtime.     Multiple Vitamins-Minerals (MULTIVITAMINS/MINERALS ADULT PO) Take 1 each by mouth in the morning. Gummies     Nutritional Supplements (FEEDING SUPPLEMENT, OSMOLITE 1.5 CAL,) LIQD Place 1,000 mLs into feeding tube See admin instructions. Take 1 can by gastric tube every 3 hours     ondansetron  (ZOFRAN -ODT) 8 MG disintegrating tablet Take 1 tablet (8 mg total) by mouth every 8 (eight) hours as needed for nausea or vomiting. 90 tablet 3   pioglitazone (ACTOS) 30 MG tablet Take 30 mg by mouth daily.     sertraline  (ZOLOFT ) 25 MG tablet Take 1 tablet (25 mg total) by mouth daily. 90 tablet 3   sitaGLIPtin (JANUVIA)  50 MG tablet Take 50 mg by mouth at bedtime.     No current facility-administered medications for this visit.    REVIEW OF SYSTEMS:   Constitutional: ( - ) fevers, ( - )  chills , ( - ) night sweats Eyes: ( - ) blurriness of vision, ( - ) double vision, ( - ) watery eyes Ears, nose, mouth, throat, and face: ( - ) mucositis, ( - ) sore throat Respiratory: ( - ) cough, ( - ) dyspnea, ( - ) wheezes Cardiovascular: ( - ) palpitation, ( - ) chest discomfort, ( - ) lower extremity swelling Gastrointestinal:  ( - ) nausea, ( - ) heartburn, ( - ) change in bowel habits Skin: ( - ) abnormal skin rashes Lymphatics: ( - ) new lymphadenopathy, ( - ) easy bruising Neurological: ( - ) numbness, ( - ) tingling, ( - ) new weaknesses Behavioral/Psych: ( - ) mood change, ( - ) new changes  All other systems were reviewed with the patient and are negative.  PHYSICAL EXAMINATION: ECOG PERFORMANCE STATUS: 1 - Symptomatic but completely ambulatory  Vitals:   01/23/24 1139  BP: 131/72  Pulse: 73  Resp: 15  Temp: 98.1 F (36.7 C)  SpO2: 97%     Filed Weights   01/23/24 1139  Weight: (!) 323 lb 3.2 oz (146.6 kg)      GENERAL: Well-appearing elderly Caucasian male, alert, no distress and comfortable SKIN: skin color, texture, turgor are normal, no rashes or significant lesions EYES: conjunctiva are pink and non-injected, sclera clear LUNGS: clear to auscultation and percussion with normal breathing effort HEART: regular rate & rhythm and no murmurs and no lower extremity edema Musculoskeletal: no cyanosis of digits and no clubbing  PSYCH: alert & oriented x 3, fluent speech NEURO: no focal motor/sensory deficits  LABORATORY DATA:  I have reviewed the data as listed    Latest Ref Rng & Units 01/23/2024   11:01 AM 10/23/2023   12:04 PM 07/20/2023   10:51 AM  CBC  WBC 4.0 - 10.5 K/uL 4.7  4.9  5.0   Hemoglobin 13.0 - 17.0 g/dL 86.6  86.1  86.7   Hematocrit 39.0 - 52.0 % 40.0  41.3  40.3    Platelets 150 - 400 K/uL 169  179  186        Latest Ref Rng & Units 01/23/2024   11:01 AM 10/23/2023   12:04 PM 10/01/2023    1:51 PM  CMP  Glucose 70 - 99 mg/dL 71  79    BUN 8 - 23 mg/dL 18  25    Creatinine 9.38 - 1.24 mg/dL 9.11  9.04  8.99   Sodium 135 - 145 mmol/L 137  136    Potassium 3.5 - 5.1 mmol/L 4.3  4.1    Chloride 98 - 111 mmol/L 106  104    CO2 22 - 32 mmol/L 28  29    Calcium  8.9 - 10.3 mg/dL 9.5  9.7    Total Protein 6.5 - 8.1 g/dL 6.9  6.9    Total Bilirubin 0.0 - 1.2 mg/dL 0.6  0.6    Alkaline Phos 38 - 126 U/L 109  123    AST 15 - 41 U/L 20  21    ALT 0 - 44 U/L 21  25      RADIOGRAPHIC STUDIES: No results found.   ASSESSMENT & PLAN Keith Moreno is a 63 y.o. male with medical history significant for localized esophageal adenocarcinoma who presents for a follow up visit.   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 Stage IIB (cT2,cN0, cM0) --completed EUS and EBUS to finalize staging on 12/14/2021 --biopsy confirms moderate-poorly differentiated adenocarcinoma of the esophagus.  -- Received concurrent chemoradiation, started on 12/26/2021. End chemotherapy on 01/23/2022 (last dose on 01/30/2022 held due to neutropenia). Last radiation 02/02/2022. --Underwent Ivor-Lewis Esophagectomy on 04/05/2022. Pathology revealed focal residual invasive poorly differentiated adenocarcinoma into muscularis propria with tumor regression score of 1 of 3 (near complete response). 21 lymph nodes removed all negative for malignancy. Final pathology stage Is ypT2N0.  Plan:  --Labs today were reviewed. WBC 4.7, Hgb 13.3, MCV 88.1, Plt 169  --Most recent CT CAP from 10/01/2023 showed s/p esophagectomy with gastric pull-through. No evidence of recurrence or metastatic disease. Next CT scan will be due around September 2025.  --Discussed NCCN guidelines for labs and visit every 3-6 months and CT scan every 6 months for first 2 years then transition to  labs and visit every 6 months and CT scans annually.  --RTC in 3 months for a follow up with labs  #Hypoglycemia: --Glucose was 71 today --Patient denies fasting prior to blood work. --Advised to follow up with PCP to consider holding and modifying diabetic medication.   #Hypotension--improved --Today's BP is 131/72 --Current BP medications include amlodipine  10 mg PO daily  --Advised to stay hydrated.   #Supportive Care -- chemotherapy education completed -- port removed  -- zofran  8mg  q8H PRN and compazine  10mg  PO q6H for nausea -- EMLA  cream for port -- no pain medication required at this time.   Orders Placed This Encounter  Procedures   CT CHEST ABDOMEN PELVIS W CONTRAST    Standing Status:   Future    Expected Date:   03/24/2024    Expiration Date:   02/02/2025    If indicated for the ordered procedure, I authorize the administration of contrast media per Radiology protocol:   Yes    Does the patient have a contrast media/X-ray dye allergy?:   No  Preferred imaging location?:   [facility]    If indicated for the ordered procedure, I authorize the administration of oral contrast media per Radiology protocol:   Yes    All questions were answered. The patient knows to call the clinic with any problems, questions or concerns.  I have spent a total of 30 minutes minutes of face-to-face and non-face-to-face time, preparing to see the patient, performing a medically appropriate examination, counseling and educating the patient, , communicating with other health care professionals, documenting clinical information in the electronic health record, and care coordination.   Norleen IVAR Kidney, MD Department of Hematology/Oncology [facility] Phone: [phone] Pager: [phone] Email: [email or web address]    02/03/2024 3:01 PM

## 2024-02-03 ENCOUNTER — Encounter: Payer: Self-pay | Admitting: Hematology and Oncology

## 2024-02-03 MED ORDER — SERTRALINE HCL 25 MG PO TABS: 25.0000 mg | ORAL_TABLET | Freq: Every day | ORAL | 3 refills | Status: DC

## 2024-02-03 MED ORDER — APIXABAN 2.5 MG PO TABS: 2.5000 mg | ORAL_TABLET | Freq: Two times a day (BID) | ORAL | 3 refills | Status: DC

## 2024-02-04 ENCOUNTER — Other Ambulatory Visit (HOSPITAL_COMMUNITY): Payer: Self-pay | Admitting: Interventional Radiology

## 2024-02-04 DIAGNOSIS — R633 Feeding difficulties, unspecified: Secondary | ICD-10-CM

## 2024-02-05 ENCOUNTER — Other Ambulatory Visit: Payer: Self-pay | Admitting: *Deleted

## 2024-02-05 MED ORDER — APIXABAN 5 MG PO TABS: 5.0000 mg | ORAL_TABLET | Freq: Two times a day (BID) | ORAL | 5 refills | Status: AC

## 2024-02-07 ENCOUNTER — Ambulatory Visit (HOSPITAL_COMMUNITY)
Admission: RE | Admit: 2024-02-07 | Discharge: 2024-02-07 | Disposition: A | Discharge status: Home / Self Care | Source: Ambulatory Visit | Attending: Interventional Radiology | Admitting: Interventional Radiology

## 2024-02-07 DIAGNOSIS — Z434 Encounter for attention to other artificial openings of digestive tract: Secondary | ICD-10-CM | POA: Diagnosis present

## 2024-02-07 DIAGNOSIS — R633 Feeding difficulties, unspecified: Secondary | ICD-10-CM | POA: Insufficient documentation

## 2024-02-07 HISTORY — PX: IR REPLC DUODEN/JEJUNO TUBE PERCUT W/FLUORO: IMG2334

## 2024-02-07 MED ORDER — IOHEXOL 300 MG/ML  SOLN
50.0000 mL | Freq: Once | INTRAMUSCULAR | Status: AC | PRN
  Administered 2024-02-07: 20 mL

## 2024-03-24 ENCOUNTER — Ambulatory Visit (HOSPITAL_COMMUNITY)
Admission: RE | Admit: 2024-03-24 | Discharge: 2024-03-24 | Disposition: A | Discharge status: Home / Self Care | Source: Ambulatory Visit | Attending: Hematology and Oncology | Admitting: Hematology and Oncology

## 2024-03-24 DIAGNOSIS — C159 Malignant neoplasm of esophagus, unspecified: Secondary | ICD-10-CM | POA: Insufficient documentation

## 2024-03-24 LAB — POCT I-STAT CREATININE: Creatinine, Ser: 1 mg/dL (ref 0.61–1.24)

## 2024-03-24 MED ORDER — IOHEXOL 300 MG/ML  SOLN
100.0000 mL | Freq: Once | INTRAMUSCULAR | Status: AC | PRN
  Administered 2024-03-24: 100 mL via INTRAVENOUS

## 2024-03-27 ENCOUNTER — Other Ambulatory Visit: Payer: Self-pay | Admitting: Hematology and Oncology

## 2024-03-27 DIAGNOSIS — K769 Liver disease, unspecified: Secondary | ICD-10-CM

## 2024-03-28 ENCOUNTER — Telehealth: Payer: Self-pay | Admitting: Physician Assistant

## 2024-03-30 ENCOUNTER — Encounter (HOSPITAL_COMMUNITY): Payer: Self-pay

## 2024-04-02 ENCOUNTER — Inpatient Hospital Stay

## 2024-04-02 ENCOUNTER — Inpatient Hospital Stay: Admitting: Physician Assistant

## 2024-04-04 ENCOUNTER — Ambulatory Visit (HOSPITAL_COMMUNITY)
Admission: RE | Admit: 2024-04-04 | Discharge: 2024-04-04 | Disposition: A | Discharge status: Home / Self Care | Source: Ambulatory Visit | Attending: Hematology and Oncology | Admitting: Hematology and Oncology

## 2024-04-04 DIAGNOSIS — K769 Liver disease, unspecified: Secondary | ICD-10-CM | POA: Insufficient documentation

## 2024-04-04 MED ORDER — GADOBUTROL 1 MMOL/ML IV SOLN
10.0000 mL | Freq: Once | INTRAVENOUS | Status: AC | PRN
Start: 2024-04-04 — End: 2024-04-04
  Administered 2024-04-04: 10 mL via INTRAVENOUS

## 2024-04-08 ENCOUNTER — Other Ambulatory Visit: Payer: Self-pay | Admitting: Physician Assistant

## 2024-04-08 DIAGNOSIS — C159 Malignant neoplasm of esophagus, unspecified: Secondary | ICD-10-CM

## 2024-04-08 NOTE — Progress Notes (Unsigned)
 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 Stage IIB (cT2,cN0, cM0) 11/13/2021: Presented to the emergency department complaining of dysphagia and food impaction. Underwent EGD with Dr. Albertus. Findings revealed medium sized fungating mass in the lower third of th esophagus, 35 cm from the incisors and extending into the GE junction.   It appears to have arisen in the background of Barrett's esophagus, the mass was nonobstructing and partially circumferential. Pathology confirmed moderately to poorly differentiated adenocarcinoma.  11/30/2021: PET scan showed  hypermetabolic activity in the distal esophagus with an SUV of 7.7 with thickening measuring up to 15 mm and spanning just to above the GE junction.  Diffuse mediastinal adenopathy showed dense calcification in all nearly symmetric findings.  12/06/2021: Established care with Radiation Oncology with Dr. Norleen Limes and Donald Husband PA-C 12/12/2021: Establish care with Medication Oncology with Dr. Norleen Federico and Johnston Neomi PA-C 12/26/2021: Started Carbo/Paclitaxel  with radiation. End chemotherapy on 01/23/2022 (last dose on 01/30/2022 held due to neutropenia). Last radiation 02/02/2022.  04/05/2022: Underwent Ivor-Lewis Esophagectomy. Pathology revealed focal residual invasive poorly differentiated adenocarcinoma into muscularis propria with tumor regression score of 1 of 3 (near complete response). 21 lymph nodes removed all negative for malignancy. Final pathology stage Is ypT2N0.   Interval History:  Keith Moreno 63 y.o. male with  medical history significant for localized esophageal adenocarcinoma who presents for a follow up visit. The patient's last visit was on 01/23/2024. In the interim since the last visit he has not been hospitalized.   On exam today Keith Moreno is accompanied by his wife. He reports his energy and appetite are fairly stable. He still struggles with dysphagia with liquids so he uses his feeding tube at nighttime for hydration. He denies nausea, vomiting or abdominal pain. He has constipation that improves with stool softeners and laxatives. He denies easy bruising or signs of bleeding. He has been having sinus congestion and post nasal drip. He denies fevers, chills, sweats, chest pain or cough. He has no other complaints. Rest of the ROS is below.   MEDICAL HISTORY:  Past Medical History:  Diagnosis Date   Anxiety    Cancer (HCC)    Esophageal Cancer   COVID 2022   mild case   Dyspnea    r/t chemo and radiation   Headache    History of kidney stones    Hypertension    Kidney infection    at age 35   Pneumonia    63 years old   Pre-diabetes    PTSD (post-traumatic stress disorder)    per pt, if woken up suddenly he cocks back arm as if to punch but usually wakes up enough to come to before he hits anyone    SURGICAL HISTORY: Past Surgical History:  Procedure Laterality Date   BIOPSY  11/13/2021   Procedure: BIOPSY;  Surgeon: Albertus Gordy HERO, MD;  Location: [facility] ENDOSCOPY;  Service: Gastroenterology;;   BIOPSY  12/14/2021   Procedure: BIOPSY;  Surgeon: Wilhelmenia Aloha Raddle., MD;  Location: [facility] ENDOSCOPY;  Service: Gastroenterology;;   BRONCHIAL BRUSHINGS  12/14/2021   Procedure: BRONCHIAL BRUSHINGS;  Surgeon: Shelah Lamar RAMAN, MD;  Location:  [facility] ENDOSCOPY;  Service: Cardiopulmonary;;   BRONCHIAL NEEDLE ASPIRATION BIOPSY  12/14/2021   Procedure: BRONCHIAL NEEDLE ASPIRATION BIOPSIES;  Surgeon: Shelah Lamar RAMAN, MD;  Location: [facility] ENDOSCOPY;  Service: Cardiopulmonary;;   COLONOSCOPY  2018    CYST EXCISION  2022   left side of head   ESOPHAGEAL STENT PLACEMENT N/A 06/27/2022   Procedure: ESOPHAGEAL STENT REMOVAL;  Surgeon: Shyrl Linnie KIDD, MD;  Location: [facility] OR;  Service: Thoracic;  Laterality: N/A;   ESOPHAGEAL STENT PLACEMENT N/A 06/28/2022   Procedure: ESOPHAGEAL STENT PLACEMENT;  Surgeon: Shyrl Linnie KIDD, MD;  Location: [facility] OR;  Service: Thoracic;  Laterality: N/A;   ESOPHAGEAL STENT PLACEMENT N/A 08/10/2022   Procedure: ESOPHAGEAL STENT REMOVAL;  Surgeon: Shyrl Linnie KIDD, MD;  Location: [facility] OR;  Service: Thoracic;  Laterality: N/A;   ESOPHAGECTOMY  04/05/2022   ESOPHAGOGASTRODUODENOSCOPY N/A 11/13/2021   Procedure: ESOPHAGOGASTRODUODENOSCOPY (EGD);  Surgeon: Albertus Gordy HERO, MD;  Location: [facility] ENDOSCOPY;  Service: Gastroenterology;  Laterality: N/A;   ESOPHAGOGASTRODUODENOSCOPY N/A 04/05/2022   Procedure: ESOPHAGOGASTRODUODENOSCOPY (EGD);  Surgeon: Shyrl Linnie KIDD, MD;  Location: [facility] OR;  Service: Thoracic;  Laterality: N/A;   ESOPHAGOGASTRODUODENOSCOPY N/A 05/09/2022   Procedure: ESOPHAGOGASTRODUODENOSCOPY (EGD);  Surgeon: Shyrl Linnie KIDD, MD;  Location: [facility] OR;  Service: Thoracic;  Laterality: N/A;  esophageal stent placement   ESOPHAGOGASTRODUODENOSCOPY N/A 06/27/2022   Procedure: ESOPHAGOGASTRODUODENOSCOPY (EGD);  Surgeon: Shyrl Linnie KIDD, MD;  Location: [facility] OR;  Service: Thoracic;  Laterality: N/A;   ESOPHAGOGASTRODUODENOSCOPY N/A 06/28/2022   Procedure: ESOPHAGOGASTRODUODENOSCOPY (EGD);  Surgeon: Shyrl Linnie KIDD, MD;  Location: [facility] OR;  Service: Thoracic;  Laterality: N/A;   ESOPHAGOGASTRODUODENOSCOPY N/A 08/10/2022   Procedure: ESOPHAGOGASTRODUODENOSCOPY (EGD);  Surgeon: Shyrl Linnie KIDD, MD;  Location: [facility] OR;  Service: Thoracic;  Laterality: N/A;   ESOPHAGOGASTRODUODENOSCOPY (EGD) WITH PROPOFOL  N/A 12/14/2021   Procedure: ESOPHAGOGASTRODUODENOSCOPY (EGD) WITH PROPOFOL ;  Surgeon: Wilhelmenia Aloha Raddle., MD;  Location: [facility] ENDOSCOPY;  Service:  Gastroenterology;  Laterality: N/A;   EUS N/A 12/14/2021   Procedure: UPPER ENDOSCOPIC ULTRASOUND (EUS) LINEAR;  Surgeon: Wilhelmenia Aloha Raddle., MD;  Location: [facility] ENDOSCOPY;  Service: Gastroenterology;  Laterality: N/A;   FINE NEEDLE ASPIRATION  12/14/2021   Procedure: FINE NEEDLE ASPIRATION (FNA) LINEAR;  Surgeon: Wilhelmenia Aloha Raddle., MD;  Location: [facility] ENDOSCOPY;  Service: Gastroenterology;;   FOREIGN BODY REMOVAL  11/13/2021   Procedure: FOREIGN BODY REMOVAL;  Surgeon: Albertus Gordy HERO, MD;  Location: [facility] ENDOSCOPY;  Service: Gastroenterology;;   HUMERUS FRACTURE SURGERY Right    INTERCOSTAL NERVE BLOCK  04/05/2022   Procedure: INTERCOSTAL NERVE BLOCK;  Surgeon: Shyrl Linnie KIDD, MD;  Location: [facility] OR;  Service: Thoracic;;   IR IMAGING GUIDED PORT INSERTION  12/23/2021   IR REMOVAL TUN ACCESS W/ PORT W/O FL MOD SED  05/04/2023   IR REPLC DUODEN/JEJUNO TUBE PERCUT W/FLUORO  10/12/2022   IR REPLC DUODEN/JEJUNO TUBE PERCUT W/FLUORO  02/07/2024   IR THORACENTESIS ASP PLEURAL SPACE W/IMG GUIDE  04/19/2022   LAPAROSCOPIC JEJUNOSTOMY N/A 05/09/2022   Procedure: LAPAROSCOPIC JEJUNOSTOMY;  Surgeon: Shyrl Linnie KIDD, MD;  Location: [facility] OR;  Service: Thoracic;  Laterality: N/A;   MULTIPLE TOOTH EXTRACTIONS  2015   NODE DISSECTION  04/05/2022   Procedure: NODE DISSECTION;  Surgeon: Shyrl Linnie KIDD, MD;  Location: [facility] OR;  Service: Thoracic;;   TONSILLECTOMY AND ADENOIDECTOMY     as a child   VIDEO ASSISTED THORACOSCOPY Right 05/09/2022   Procedure: VIDEO ASSISTED THORACOSCOPY;  Surgeon: Shyrl Linnie KIDD, MD;  Location: [facility] OR;  Service:  Thoracic;  Laterality: Right;   VIDEO BRONCHOSCOPY WITH ENDOBRONCHIAL ULTRASOUND Bilateral 12/14/2021   Procedure: VIDEO BRONCHOSCOPY WITH ENDOBRONCHIAL ULTRASOUND;  Surgeon: Shelah Lamar RAMAN, MD;  Location: [facility] ENDOSCOPY;  Service: Cardiopulmonary;  Laterality: Bilateral;    SOCIAL HISTORY: Social History   Socioeconomic History   Marital status:  Single    Spouse name: Not on file   Number of children: Not on file   Years of education: Not on file   Highest education level: Not on file  Occupational History   Not on file  Tobacco Use   Smoking status: Former    Current packs/day: 0.00    Types: Cigars, Cigarettes, Pipe    Quit date: 47    Years since quitting: 42.7   Smokeless tobacco: Former    Types: Chew    Quit date: 1983  Vaping Use   Vaping status: Never Used  Substance and Sexual Activity   Alcohol use: Not Currently    Comment: Rarely   Drug use: No   Sexual activity: Yes  Other Topics Concern   Not on file  Social History Narrative   Not on file   Social Drivers of Health   Financial Resource Strain: Not on file  Food Insecurity: No Food Insecurity (05/11/2023)   Hunger Vital Sign    Worried About Running Out of Food in the Last Year: Never true    Ran Out of Food in the Last Year: Never true  Transportation Needs: No Transportation Needs (05/11/2023)   PRAPARE - Administrator, Civil Service (Medical): No    Lack of Transportation (Non-Medical): No  Physical Activity: Not on file  Stress: Not on file  Social Connections: Unknown (11/14/2022)   Received from [facility]   Social Network    Social Network: Not on file  Intimate Partner Violence: Not At Risk (05/11/2023)   Humiliation, Afraid, Rape, and Kick questionnaire    Fear of Current or Ex-Partner: No    Emotionally Abused: No    Physically Abused: No    Sexually Abused: No    FAMILY HISTORY: Family History  Problem Relation Age of Onset   Hypertension Mother    Diabetes Mother    Bipolar disorder Mother    Hypertension Father    Kidney cancer Sister    Colon cancer Paternal Uncle    Cancer Cousin    Bladder Cancer Cousin    Cancer Cousin     ALLERGIES:  is allergic to procaine, chocolate, lisinopril, and other.  MEDICATIONS:  Current Outpatient Medications  Medication Sig Dispense Refill   acetaminophen  (TYLENOL )  500 MG tablet Take 2 tablets (1,000 mg total) by mouth every 6 (six) hours as needed for mild pain or fever. (Patient taking differently: Take 500 mg by mouth every 6 (six) hours as needed for mild pain (pain score 1-3) or fever.) 30 tablet 0   amLODipine  (NORVASC ) 10 MG tablet Take 10 mg by mouth daily.     apixaban  (ELIQUIS ) 5 MG TABS tablet Take 1 tablet (5 mg total) by mouth 2 (two) times daily. 60 tablet 5   benzonatate (TESSALON) 100 MG capsule Take 100 mg by mouth daily as needed for cough.     DOCUSATE SODIUM  PO Take 2-3 capsules by mouth at bedtime.     famotidine  (PEPCID ) 20 MG tablet Take 1 tablet (20 mg total) by mouth 2 (two) times daily. 60 tablet 0   lansoprazole  (PREVACID  SOLUTAB) 30 MG disintegrating tablet Take 30 mg  by mouth 2 (two) times daily before a meal. Dissolve 1 tablet in mouth twice a day before meals (take on an empty stomach 30 minutes prior to a meal)     lidocaine  (LIDODERM ) 5 % Place 1 patch onto the skin See admin instructions. Apply 1 patch to the skin once daily for back pain (apply for 12 hours, then remove for 12 hours) as needed     Melatonin 10 MG TABS Take 10 mg by mouth at bedtime. (Patient taking differently: Take 30 mg by mouth at bedtime.)     Multiple Vitamins-Minerals (MULTIVITAMINS/MINERALS ADULT PO) Take 1 each by mouth in the morning. Gummies     Nutritional Supplements (FEEDING SUPPLEMENT, OSMOLITE 1.5 CAL,) LIQD Place 1,000 mLs into feeding tube See admin instructions. Take 1 can by gastric tube every 3 hours     ondansetron  (ZOFRAN -ODT) 8 MG disintegrating tablet Take 1 tablet (8 mg total) by mouth every 8 (eight) hours as needed for nausea or vomiting. 90 tablet 3   pioglitazone (ACTOS) 30 MG tablet Take 30 mg by mouth daily.     sertraline  (ZOLOFT ) 25 MG tablet Take 1 tablet (25 mg total) by mouth daily. (Patient taking differently: Take 100 mg by mouth daily.) 90 tablet 3   simethicone (MYLICON) 125 MG chewable tablet Chew 250 mg by mouth every 6  (six) hours as needed for flatulence.     sitaGLIPtin (JANUVIA) 50 MG tablet Take 50 mg by mouth at bedtime.     No current facility-administered medications for this visit.    REVIEW OF SYSTEMS:   Constitutional: ( - ) fevers, ( - )  chills , ( - ) night sweats Eyes: ( - ) blurriness of vision, ( - ) double vision, ( - ) watery eyes Ears, nose, mouth, throat, and face: ( - ) mucositis, ( - ) sore throat Respiratory: ( - ) cough, ( - ) dyspnea, ( - ) wheezes Cardiovascular: ( - ) palpitation, ( - ) chest discomfort, ( - ) lower extremity swelling Gastrointestinal:  ( - ) nausea, ( - ) heartburn, ( - ) change in bowel habits Skin: ( - ) abnormal skin rashes Lymphatics: ( - ) new lymphadenopathy, ( - ) easy bruising Neurological: ( - ) numbness, ( - ) tingling, ( - ) new weaknesses Behavioral/Psych: ( - ) mood change, ( - ) new changes  All other systems were reviewed with the patient and are negative.  PHYSICAL EXAMINATION: ECOG PERFORMANCE STATUS: 1 - Symptomatic but completely ambulatory  Vitals:   04/09/24 1316  BP: (!) 141/76  Pulse: 75  Resp: 14  Temp: (!) 97.2 F (36.2 C)  SpO2: 97%      Filed Weights   04/09/24 1316  Weight: (!) 321 lb 8 oz (145.8 kg)       GENERAL: Well-appearing elderly Caucasian male, alert, no distress and comfortable SKIN: skin color, texture, turgor are normal, no rashes or significant lesions EYES: conjunctiva are pink and non-injected, sclera clear LUNGS: clear to auscultation and percussion with normal breathing effort HEART: regular rate & rhythm and no murmurs and no lower extremity edema Musculoskeletal: no cyanosis of digits and no clubbing  PSYCH: alert & oriented x 3, fluent speech NEURO: no focal motor/sensory deficits  LABORATORY DATA:  I have reviewed the data as listed    Latest Ref Rng & Units 04/09/2024   12:56 PM 01/23/2024   11:01 AM 10/23/2023   12:04 PM  CBC  WBC 4.0 -  10.5 K/uL 4.8  4.7  4.9   Hemoglobin 13.0 -  17.0 g/dL 86.0  86.6  86.1   Hematocrit 39.0 - 52.0 % 41.2  40.0  41.3   Platelets 150 - 400 K/uL 165  169  179        Latest Ref Rng & Units 04/09/2024   12:56 PM 03/24/2024    3:08 PM 01/23/2024   11:01 AM  CMP  Glucose 70 - 99 mg/dL 71   71   BUN 8 - 23 mg/dL 18   18   Creatinine 9.38 - 1.24 mg/dL 9.13  8.99  9.11   Sodium 135 - 145 mmol/L 139   137   Potassium 3.5 - 5.1 mmol/L 3.9   4.3   Chloride 98 - 111 mmol/L 107   106   CO2 22 - 32 mmol/L 27   28   Calcium  8.9 - 10.3 mg/dL 9.3   9.5   Total Protein 6.5 - 8.1 g/dL 6.6   6.9   Total Bilirubin 0.0 - 1.2 mg/dL 0.5   0.6   Alkaline Phos 38 - 126 U/L 110   109   AST 15 - 41 U/L 16   20   ALT 0 - 44 U/L 17   21     RADIOGRAPHIC STUDIES: MR LIVER W WO CONTRAST Result Date: 04/04/2024 CLINICAL DATA:  Liver lesion, > 1cm, history of malignancy. EXAM: MRI ABDOMEN WITHOUT AND WITH CONTRAST TECHNIQUE: Multiplanar multisequence MR imaging of the abdomen was performed both before and after the administration of intravenous contrast. CONTRAST:  10mL GADAVIST  GADOBUTROL  1 MMOL/ML IV SOLN COMPARISON:  CT scan abdomen and pelvis from 03/24/2024. FINDINGS: Lower chest: Note is made of prominent extrapleural fat along the right upper posterolateral hemithorax. Otherwise, unremarkable MR appearance to the lung bases. No pleural effusion. No pericardial effusion. Normal heart size. Hepatobiliary: The liver is normal in size. Noncirrhotic configuration. No focal mass. There is no significant hepatic steatosis. There is a faint, linear configuration approximately 6 x 17 mm (series 18, image 51), subtle minimally T2 hyperintense and T1 hypointense area in the subcapsular left hepatic lobe, segment 4B, which corresponds to the observation seen on the recent CT scan. The area does not exhibit significant enhancement on the postcontrast images. In retrospect, this area was present on the prior CT scan from 04/02/2023 (series 2, image 64). This is nonspecific  but favored benign and differential diagnosis includes small thrombosed vascular structure versus small area of liver infarction. No other suspicious liver lesion. No intrahepatic or extrahepatic bile duct dilatation. No choledocholithiasis. Unremarkable gallbladder. Pancreas: No mass, inflammatory changes or other parenchymal abnormality identified. No main pancreatic duct dilation. Spleen:  Within normal limits in size and appearance. No focal mass. Adrenals/Urinary Tract: Unremarkable adrenal glands. No hydroureteronephrosis. There are multiple scattered simple cysts throughout bilateral kidneys with largest arising from the left kidney lower pole, laterally measuring up to 1.7 x 1.9 cm. Stomach/Bowel: Visualized portions within the abdomen are unremarkable. No disproportionate dilation of bowel loops. Feeding jejunostomy tube noted, inserted from left paramedian anterior abdominal wall. Vascular/Lymphatic: No pathologically enlarged lymph nodes identified. No abdominal aortic aneurysm demonstrated. No ascites. Other:  None. Musculoskeletal: No suspicious bone lesions identified. IMPRESSION: 1. There is a faint, linear configuration approximately 6 x 17 mm, altered signal intensity area in the left hepatic lobe, segment 4B, which corresponds to the observation seen on the recent CT scan. The area does not exhibit significant enhancement on the postcontrast images.  In retrospect, this area was present on the prior CT scan from 04/02/2023. This is nonspecific but favored benign and differential diagnosis includes small thrombosed vascular structure versus small area of liver infarction. No other suspicious liver lesion. 2. Multiple other nonacute observations, as described above. Electronically Signed   By: Ree Molt M.D.   On: 04/04/2024 15:44   CT CHEST ABDOMEN PELVIS W CONTRAST Result Date: 03/26/2024 CLINICAL DATA:  Esophageal cancer, assess treatment response. * Tracking Code: BO * EXAM: CT CHEST,  ABDOMEN, AND PELVIS WITH CONTRAST TECHNIQUE: Multidetector CT imaging of the chest, abdomen and pelvis was performed following the standard protocol during bolus administration of intravenous contrast. RADIATION DOSE REDUCTION: This exam was performed according to the departmental dose-optimization program which includes automated exposure control, adjustment of the mA and/or kV according to patient size and/or use of iterative reconstruction technique. CONTRAST:  OMNIPAQUE  IOHEXOL  300 MG/ML  SOLN COMPARISON:  Multiple priors including CT October 01, 2023 FINDINGS: CT CHEST FINDINGS Cardiovascular: Aortic atherosclerosis. Borderline cardiac enlargement. Coronary artery calcifications. No significant pericardial effusion/thickening. Mediastinum/Nodes: Surgical changes of esophagectomy with gastric pull-through, no new suspicious soft tissue nodularity along the suture line. Numerous mediastinal and hilar lymph nodes the majority of which are partially calcified has been stable across multiple prior examinations and favored sequela of known sarcoidosis. No definite new suspicious thoracic lymph nodes. Lungs/Pleura: Small scattered pulmonary nodules some of which demonstrate internal calcifications are stable from prior favored sequela of known pulmonary sarcoidosis. No new suspicious pulmonary nodules or masses. For reference: -6 mm nodule in the left upper lobe on image 46/5 unchanged -3 mm nodule in the right upper lobe on image 48/5, unchanged. -5 mm nodule in the right upper lobe along the major fissure on image 62/5, unchanged. Trace left pleural effusion, chronic. Right pleural thickening some of which is nodular for instance image 81/5 is stable from prior. Scattered atelectasis/scarring. Musculoskeletal: No aggressive lytic or blastic lesion of bone. CT ABDOMEN PELVIS FINDINGS Hepatobiliary: Ill-defined segment IV hepatic density measuring 17 mm on image 64/2 new from prior examination. Hepatic steatosis  with probable sparing in the lateral left lobe of the liver and hepatic hilum. Gallbladder is unremarkable. No biliary ductal dilation. Pancreas: No pancreatic ductal dilation or evidence of acute inflammation. Spleen: No splenomegaly. Adrenals/Urinary Tract: No suspicious adrenal nodule/mass. Hydronephrosis. Bilateral nonobstructive renal stones. Urinary bladder is unremarkable for degree of distension. Too small to accurately characterize bilateral renal lesions and an exophytic left lower pole 18 mm renal cyst. Stomach/Bowel: Left anterior abdominal wall percutaneous jejunostomy tube. Gastric pull-through anatomy. Colonic diverticulosis. No evidence of bowel obstruction. Vascular/Lymphatic: Normal caliber abdominal aorta. Smooth IVC contours. No pathologically enlarged abdominal or pelvic lymph nodes. Reproductive: Dystrophic prostatic calcifications. Other: No significant abdominopelvic free fluid. Musculoskeletal: No aggressive lytic or blastic lesion of bone. IMPRESSION: 1. Surgical changes of esophagectomy with gastric pull-through, no new suspicious soft tissue nodularity along the suture line. 2. Hepatic steatosis with probable fatty sparing in the lateral left lobe of the liver and along the hepatic hilum. Ill-defined segment IV hepatic density measuring 17 mm, new from prior examination. While this may reflect focal fatty sparing suggest more definitive characterization with hepatic protocol MRI with and without contrast to exclude metastatic lesion. 3. Stable small pulmonary nodules, mediastinal and hilar lymph nodes, and right pleural thickening, favored sequela of known sarcoidosis. 4. Bilateral nonobstructive renal stones. 5. Colonic diverticulosis without evidence of acute diverticulitis. 6.  Aortic Atherosclerosis (ICD10-I70.0). Electronically Signed   By:  Reyes Holder M.D.   On: 03/26/2024 16:29     ASSESSMENT & PLAN Keith Moreno is a 63 y.o. male with medical history significant for  localized esophageal adenocarcinoma who presents for a follow up visit.   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 Stage IIB (cT2,cN0, cM0) --completed EUS and EBUS to finalize staging on 12/14/2021 --biopsy confirms moderate-poorly differentiated adenocarcinoma of the esophagus.  -- Received concurrent chemoradiation, started on 12/26/2021. End chemotherapy on 01/23/2022 (last dose on 01/30/2022 held due to neutropenia). Last radiation 02/02/2022. --Underwent Ivor-Lewis Esophagectomy on 04/05/2022. Pathology revealed focal residual invasive poorly differentiated adenocarcinoma into muscularis propria with tumor regression score of 1 of 3 (near complete response). 21 lymph nodes removed all negative for malignancy. Final pathology stage Is ypT2N0.  --NCCN guidelines for labs and visit every 3-6 months and CT scan every 6 months for first 2 years then transition to labs and visit every 6 months and CT scans annually.  Plan:  --Labs today were reviewed and require no intervention. WBC 4.8, Hgb 13.9, Plt 165, creatinine and LFTs are normal.  --Most recent CT CAP from 03/24/2024 showed post surgical changes with no recurrence at surgical site. There is hepatic steatosis with an ill defined segment IV hepatic density measuring 17 mm, new from prior exam. Stable small pulmonary nodules, mediastinal and hilar lymphadenopathy, pleural thickening most consistent with sarcoidosis.  --MRI liver from 04/04/2024 revealed findings that are most consistent with a small thrombosed vascular structure ( basically an area of poor blood flow). It does not appear to be a recurrence of his cancer. The radiologist looked through older scans and noted it has been present over the last several images.  --No further intervention an we will continue to monitor as scheduled.  --RTC in 6 months with labs/follow up.    #Supportive Care -- chemotherapy education completed -- port removed  -- zofran  8mg   q8H PRN and compazine  10mg  PO q6H for nausea -- EMLA  cream for port -- no pain medication required at this time.   No orders of the defined types were placed in this encounter.   All questions were answered. The patient knows to call the clinic with any problems, questions or concerns.  I have spent a total of 30 minutes minutes of face-to-face and non-face-to-face time, preparing to see the patient, performing a medically appropriate examination, counseling and educating the patient, , communicating with other health care professionals, documenting clinical information in the electronic health record, and care coordination.   Johnston Police PA-C Dept of Hematology and Oncology [facility] Phone: [phone]     04/09/2024 10:39 PM

## 2024-04-09 ENCOUNTER — Inpatient Hospital Stay: Admitting: Physician Assistant

## 2024-04-09 ENCOUNTER — Inpatient Hospital Stay: Attending: Physician Assistant

## 2024-04-09 VITALS — BP 141/76 | HR 75 | Temp 97.2°F | Resp 14 | Wt 321.5 lb

## 2024-04-09 DIAGNOSIS — Z888 Allergy status to other drugs, medicaments and biological substances status: Secondary | ICD-10-CM | POA: Insufficient documentation

## 2024-04-09 DIAGNOSIS — F17290 Nicotine dependence, other tobacco product, uncomplicated: Secondary | ICD-10-CM | POA: Insufficient documentation

## 2024-04-09 DIAGNOSIS — C159 Malignant neoplasm of esophagus, unspecified: Secondary | ICD-10-CM

## 2024-04-09 DIAGNOSIS — Z8052 Family history of malignant neoplasm of bladder: Secondary | ICD-10-CM | POA: Insufficient documentation

## 2024-04-09 DIAGNOSIS — D709 Neutropenia, unspecified: Secondary | ICD-10-CM | POA: Diagnosis not present

## 2024-04-09 DIAGNOSIS — Z8701 Personal history of pneumonia (recurrent): Secondary | ICD-10-CM | POA: Diagnosis not present

## 2024-04-09 DIAGNOSIS — Z9089 Acquired absence of other organs: Secondary | ICD-10-CM | POA: Insufficient documentation

## 2024-04-09 DIAGNOSIS — K5900 Constipation, unspecified: Secondary | ICD-10-CM | POA: Diagnosis not present

## 2024-04-09 DIAGNOSIS — Z9049 Acquired absence of other specified parts of digestive tract: Secondary | ICD-10-CM | POA: Diagnosis not present

## 2024-04-09 DIAGNOSIS — Z8051 Family history of malignant neoplasm of kidney: Secondary | ICD-10-CM | POA: Insufficient documentation

## 2024-04-09 DIAGNOSIS — N132 Hydronephrosis with renal and ureteral calculous obstruction: Secondary | ICD-10-CM | POA: Diagnosis not present

## 2024-04-09 DIAGNOSIS — Z7901 Long term (current) use of anticoagulants: Secondary | ICD-10-CM | POA: Diagnosis not present

## 2024-04-09 DIAGNOSIS — F17210 Nicotine dependence, cigarettes, uncomplicated: Secondary | ICD-10-CM | POA: Insufficient documentation

## 2024-04-09 DIAGNOSIS — Z8616 Personal history of COVID-19: Secondary | ICD-10-CM | POA: Insufficient documentation

## 2024-04-09 DIAGNOSIS — Z8249 Family history of ischemic heart disease and other diseases of the circulatory system: Secondary | ICD-10-CM | POA: Insufficient documentation

## 2024-04-09 DIAGNOSIS — Z79899 Other long term (current) drug therapy: Secondary | ICD-10-CM | POA: Diagnosis not present

## 2024-04-09 DIAGNOSIS — Z833 Family history of diabetes mellitus: Secondary | ICD-10-CM | POA: Insufficient documentation

## 2024-04-09 DIAGNOSIS — C155 Malignant neoplasm of lower third of esophagus: Secondary | ICD-10-CM | POA: Insufficient documentation

## 2024-04-09 DIAGNOSIS — N4289 Other specified disorders of prostate: Secondary | ICD-10-CM | POA: Diagnosis not present

## 2024-04-09 DIAGNOSIS — D869 Sarcoidosis, unspecified: Secondary | ICD-10-CM | POA: Insufficient documentation

## 2024-04-09 DIAGNOSIS — Z923 Personal history of irradiation: Secondary | ICD-10-CM | POA: Diagnosis not present

## 2024-04-09 DIAGNOSIS — Z818 Family history of other mental and behavioral disorders: Secondary | ICD-10-CM | POA: Insufficient documentation

## 2024-04-09 DIAGNOSIS — N281 Cyst of kidney, acquired: Secondary | ICD-10-CM | POA: Insufficient documentation

## 2024-04-09 DIAGNOSIS — J9811 Atelectasis: Secondary | ICD-10-CM | POA: Insufficient documentation

## 2024-04-09 DIAGNOSIS — J90 Pleural effusion, not elsewhere classified: Secondary | ICD-10-CM | POA: Insufficient documentation

## 2024-04-09 DIAGNOSIS — Z87442 Personal history of urinary calculi: Secondary | ICD-10-CM | POA: Diagnosis not present

## 2024-04-09 DIAGNOSIS — I2510 Atherosclerotic heart disease of native coronary artery without angina pectoris: Secondary | ICD-10-CM | POA: Diagnosis not present

## 2024-04-09 DIAGNOSIS — I700 Atherosclerosis of aorta: Secondary | ICD-10-CM | POA: Diagnosis not present

## 2024-04-09 DIAGNOSIS — K760 Fatty (change of) liver, not elsewhere classified: Secondary | ICD-10-CM | POA: Insufficient documentation

## 2024-04-09 DIAGNOSIS — K5730 Diverticulosis of large intestine without perforation or abscess without bleeding: Secondary | ICD-10-CM | POA: Insufficient documentation

## 2024-04-09 DIAGNOSIS — Z9221 Personal history of antineoplastic chemotherapy: Secondary | ICD-10-CM | POA: Insufficient documentation

## 2024-04-09 DIAGNOSIS — Z800 Family history of malignant neoplasm of digestive organs: Secondary | ICD-10-CM | POA: Insufficient documentation

## 2024-04-09 LAB — CMP (CANCER CENTER ONLY)
ALT: 17 U/L (ref 0–44)
AST: 16 U/L (ref 15–41)
Albumin: 3.9 g/dL (ref 3.5–5.0)
Alkaline Phosphatase: 110 U/L (ref 38–126)
Anion gap: 5 (ref 5–15)
BUN: 18 mg/dL (ref 8–23)
CO2: 27 mmol/L (ref 22–32)
Calcium: 9.3 mg/dL (ref 8.9–10.3)
Chloride: 107 mmol/L (ref 98–111)
Creatinine: 0.86 mg/dL (ref 0.61–1.24)
GFR, Estimated: >60 mL/min (ref >60)
Glucose, Bld: 71 mg/dL (ref 70–99)
Potassium: 3.9 mmol/L (ref 3.5–5.1)
Sodium: 139 mmol/L (ref 135–145)
Total Bilirubin: 0.5 mg/dL (ref 0.0–1.2)
Total Protein: 6.6 g/dL (ref 6.5–8.1)

## 2024-04-09 LAB — CBC WITH DIFFERENTIAL (CANCER CENTER ONLY)
Abs Immature Granulocytes: 0.02 K/uL (ref 0.00–0.07)
Basophils Absolute: 0 K/uL (ref 0.0–0.1)
Basophils Relative: 1 %
Eosinophils Absolute: 0.1 K/uL (ref 0.0–0.5)
Eosinophils Relative: 3 %
HCT: 41.2 % (ref 39.0–52.0)
Hemoglobin: 13.9 g/dL (ref 13.0–17.0)
Immature Granulocytes: 0 %
Lymphocytes Relative: 11 %
Lymphs Abs: 0.5 K/uL — ABNORMAL LOW (ref 0.7–4.0)
MCH: 29.4 pg (ref 26.0–34.0)
MCHC: 33.7 g/dL (ref 30.0–36.0)
MCV: 87.1 fL (ref 80.0–100.0)
Monocytes Absolute: 0.4 K/uL (ref 0.1–1.0)
Monocytes Relative: 9 %
Neutro Abs: 3.7 K/uL (ref 1.7–7.7)
Neutrophils Relative %: 76 %
Platelet Count: 165 K/uL (ref 150–400)
RBC: 4.73 MIL/uL (ref 4.22–5.81)
RDW: 14.1 % (ref 11.5–15.5)
WBC Count: 4.8 K/uL (ref 4.0–10.5)
nRBC: 0 % (ref 0.0–0.2)

## 2024-04-10 ENCOUNTER — Telehealth: Payer: Self-pay | Admitting: Hematology and Oncology

## 2024-04-10 NOTE — Telephone Encounter (Signed)
 Left the patient a voicemail with the scheduled appointment details.

## 2024-05-29 ENCOUNTER — Encounter: Payer: Self-pay | Admitting: Hematology and Oncology

## 2024-05-30 ENCOUNTER — Other Ambulatory Visit (HOSPITAL_COMMUNITY): Payer: Self-pay | Admitting: Internal Medicine

## 2024-05-30 DIAGNOSIS — R519 Headache, unspecified: Secondary | ICD-10-CM

## 2024-06-06 ENCOUNTER — Ambulatory Visit (HOSPITAL_COMMUNITY)
Admission: RE | Admit: 2024-06-06 | Discharge: 2024-06-06 | Disposition: A | Source: Ambulatory Visit | Attending: Internal Medicine | Admitting: Internal Medicine

## 2024-06-06 DIAGNOSIS — R519 Headache, unspecified: Secondary | ICD-10-CM | POA: Insufficient documentation

## 2024-06-12 ENCOUNTER — Encounter (INDEPENDENT_AMBULATORY_CARE_PROVIDER_SITE_OTHER): Payer: Self-pay | Admitting: Otolaryngology

## 2024-06-12 ENCOUNTER — Ambulatory Visit (INDEPENDENT_AMBULATORY_CARE_PROVIDER_SITE_OTHER): Admitting: Otolaryngology

## 2024-06-12 VITALS — BP 158/86 | HR 81 | Ht 72.0 in | Wt 320.0 lb

## 2024-06-12 DIAGNOSIS — G4733 Obstructive sleep apnea (adult) (pediatric): Secondary | ICD-10-CM | POA: Diagnosis not present

## 2024-06-12 DIAGNOSIS — H04201 Unspecified epiphora, right lacrimal gland: Secondary | ICD-10-CM

## 2024-06-12 NOTE — Progress Notes (Signed)
 Dear Dr. Tor, Here is my assessment for our mutual patient, Keith Moreno. Thank you for allowing me the opportunity to care for your patient. Please do not hesitate to contact me should you have any other questions. Sincerely, Dr. Eldora Blanch  Otolaryngology Clinic Note Referring provider: Dr. Tor HPI:  Keith Moreno is a 63 y.o. male kindly referred by Dr. Tor for evaluation of OSA with CPAP intolerance  Initial visit (06/2024): Hx of esophagectomy and sleeps reclined due to severe acid reflux. Wakes up for hydration during night through G-tube.  Not getting good night's sleep and good rest. Having to nap during day. Has PTSD and feels suffocated with the CPAP but has not tried it.  Insomnia: yes, attributes due to PTSD Chest surgery: prior port, removed Denies dysphonia Does have esophageal dysphagia Workup so far: sleep study (2024) - at home  In addition, patient reports right epiphora. No nasal symptoms including congestion, CRS sx; went to ophtho, prescribed pataday , no improvement.  ENT Surgery: tonsillectomy (Peds) Personal or FHx of bleeding dz or anesthesia difficulty: no  AP/AC: Apixaban   Tobacco: former, quit.  PMHx: GAD, PTSD Esophageal Cancer (lower third) - s/p CRT and Esophagectomy with G-tube, HTN, A-fib  Independent Review of Additional Tests or Records:  CBC and CMP 04/09/2024: WBC 4.8, BUN/Cr 18/0.86 Dr. Murry (12/06/2023): OSA, eval for CN 12 stim implacement; unable to tolerate due to PTSD, never used CPAP; no teeth; working on weight loss; Not inspire candidate due to BMI [facility] Referral notes reviewed and uploaded or available in chart in media tab: ref for UPPP - not for inspire; Keith Moreno (04/24/2024) - no CPAP, Inspire, possible UPPP; showed him CPAP device and hybrid mask, willing to try if declined for UPPP Sleep study prior showing (05/09/2023):   PMH/Meds/All/SocHx/FamHx/ROS:   Past Medical History:  Diagnosis Date   Anxiety     Cancer (HCC)    Esophageal Cancer   COVID 2022   mild case   Dyspnea    r/t chemo and radiation   Headache    History of kidney stones    Hypertension    Kidney infection    at age 7   Pneumonia    63 years old   Pre-diabetes    PTSD (post-traumatic stress disorder)    per pt, if woken up suddenly he cocks back arm as if to punch but usually wakes up enough to come to before he hits anyone     Past Surgical History:  Procedure Laterality Date   BIOPSY  11/13/2021   Procedure: BIOPSY;  Surgeon: Albertus Gordy HERO, MD;  Location: [facility] ENDOSCOPY;  Service: Gastroenterology;;   BIOPSY  12/14/2021   Procedure: BIOPSY;  Surgeon: Wilhelmenia Aloha Raddle., MD;  Location: [facility] ENDOSCOPY;  Service: Gastroenterology;;   BRONCHIAL BRUSHINGS  12/14/2021   Procedure: BRONCHIAL BRUSHINGS;  Surgeon: Shelah Lamar RAMAN, MD;  Location: [facility] ENDOSCOPY;  Service: Cardiopulmonary;;   BRONCHIAL NEEDLE ASPIRATION BIOPSY  12/14/2021   Procedure: BRONCHIAL NEEDLE ASPIRATION BIOPSIES;  Surgeon: Shelah Lamar RAMAN, MD;  Location: [facility] ENDOSCOPY;  Service: Cardiopulmonary;;   COLONOSCOPY  2018   CYST EXCISION  2022   left side of head   ESOPHAGEAL STENT PLACEMENT N/A 06/27/2022   Procedure: ESOPHAGEAL STENT REMOVAL;  Surgeon: Shyrl Linnie KIDD, MD;  Location: [facility] OR;  Service: Thoracic;  Laterality: N/A;   ESOPHAGEAL STENT PLACEMENT N/A 06/28/2022   Procedure: ESOPHAGEAL STENT PLACEMENT;  Surgeon: Shyrl Linnie KIDD, MD;  Location: [facility] OR;  Service: Thoracic;  Laterality: N/A;   ESOPHAGEAL STENT PLACEMENT N/A 08/10/2022   Procedure: ESOPHAGEAL STENT REMOVAL;  Surgeon: Shyrl Linnie KIDD, MD;  Location: [facility] OR;  Service: Thoracic;  Laterality: N/A;   ESOPHAGECTOMY  04/05/2022   ESOPHAGOGASTRODUODENOSCOPY N/A 11/13/2021   Procedure: ESOPHAGOGASTRODUODENOSCOPY (EGD);  Surgeon: Albertus Gordy HERO, MD;  Location: [facility] ENDOSCOPY;  Service: Gastroenterology;  Laterality: N/A;   ESOPHAGOGASTRODUODENOSCOPY N/A 04/05/2022   Procedure:  ESOPHAGOGASTRODUODENOSCOPY (EGD);  Surgeon: Shyrl Linnie KIDD, MD;  Location: [facility] OR;  Service: Thoracic;  Laterality: N/A;   ESOPHAGOGASTRODUODENOSCOPY N/A 05/09/2022   Procedure: ESOPHAGOGASTRODUODENOSCOPY (EGD);  Surgeon: Shyrl Linnie KIDD, MD;  Location: [facility] OR;  Service: Thoracic;  Laterality: N/A;  esophageal stent placement   ESOPHAGOGASTRODUODENOSCOPY N/A 06/27/2022   Procedure: ESOPHAGOGASTRODUODENOSCOPY (EGD);  Surgeon: Shyrl Linnie KIDD, MD;  Location: [facility] OR;  Service: Thoracic;  Laterality: N/A;   ESOPHAGOGASTRODUODENOSCOPY N/A 06/28/2022   Procedure: ESOPHAGOGASTRODUODENOSCOPY (EGD);  Surgeon: Shyrl Linnie KIDD, MD;  Location: [facility] OR;  Service: Thoracic;  Laterality: N/A;   ESOPHAGOGASTRODUODENOSCOPY N/A 08/10/2022   Procedure: ESOPHAGOGASTRODUODENOSCOPY (EGD);  Surgeon: Shyrl Linnie KIDD, MD;  Location: [facility] OR;  Service: Thoracic;  Laterality: N/A;   ESOPHAGOGASTRODUODENOSCOPY (EGD) WITH PROPOFOL  N/A 12/14/2021   Procedure: ESOPHAGOGASTRODUODENOSCOPY (EGD) WITH PROPOFOL ;  Surgeon: Wilhelmenia Aloha Raddle., MD;  Location: [facility] ENDOSCOPY;  Service: Gastroenterology;  Laterality: N/A;   EUS N/A 12/14/2021   Procedure: UPPER ENDOSCOPIC ULTRASOUND (EUS) LINEAR;  Surgeon: Wilhelmenia Aloha Raddle., MD;  Location: [facility] ENDOSCOPY;  Service: Gastroenterology;  Laterality: N/A;   FINE NEEDLE ASPIRATION  12/14/2021   Procedure: FINE NEEDLE ASPIRATION (FNA) LINEAR;  Surgeon: Wilhelmenia Aloha Raddle., MD;  Location: [facility] ENDOSCOPY;  Service: Gastroenterology;;   FOREIGN BODY REMOVAL  11/13/2021   Procedure: FOREIGN BODY REMOVAL;  Surgeon: Albertus Gordy HERO, MD;  Location: [facility] ENDOSCOPY;  Service: Gastroenterology;;   HUMERUS FRACTURE SURGERY Right    INTERCOSTAL NERVE BLOCK  04/05/2022   Procedure: INTERCOSTAL NERVE BLOCK;  Surgeon: Shyrl Linnie KIDD, MD;  Location: [facility] OR;  Service: Thoracic;;   IR IMAGING GUIDED PORT INSERTION  12/23/2021   IR REMOVAL TUN ACCESS W/ PORT W/O FL MOD SED   05/04/2023   IR REPLC DUODEN/JEJUNO TUBE PERCUT W/FLUORO  10/12/2022   IR REPLC DUODEN/JEJUNO TUBE PERCUT W/FLUORO  02/07/2024   IR THORACENTESIS ASP PLEURAL SPACE W/IMG GUIDE  04/19/2022   LAPAROSCOPIC JEJUNOSTOMY N/A 05/09/2022   Procedure: LAPAROSCOPIC JEJUNOSTOMY;  Surgeon: Shyrl Linnie KIDD, MD;  Location: [facility] OR;  Service: Thoracic;  Laterality: N/A;   MULTIPLE TOOTH EXTRACTIONS  2015   NODE DISSECTION  04/05/2022   Procedure: NODE DISSECTION;  Surgeon: Shyrl Linnie KIDD, MD;  Location: [facility] OR;  Service: Thoracic;;   TONSILLECTOMY AND ADENOIDECTOMY     as a child   VIDEO ASSISTED THORACOSCOPY Right 05/09/2022   Procedure: VIDEO ASSISTED THORACOSCOPY;  Surgeon: Shyrl Linnie KIDD, MD;  Location: [facility] OR;  Service: Thoracic;  Laterality: Right;   VIDEO BRONCHOSCOPY WITH ENDOBRONCHIAL ULTRASOUND Bilateral 12/14/2021   Procedure: VIDEO BRONCHOSCOPY WITH ENDOBRONCHIAL ULTRASOUND;  Surgeon: Shelah Lamar RAMAN, MD;  Location: [facility] ENDOSCOPY;  Service: Cardiopulmonary;  Laterality: Bilateral;    Family History  Problem Relation Age of Onset   Hypertension Mother    Diabetes Mother    Bipolar disorder Mother    Hypertension Father    Kidney cancer Sister    Colon cancer Paternal Uncle    Cancer Cousin    Bladder Cancer Cousin    Cancer Cousin      Social Connections: Not on  file      Current Outpatient Medications:    acetaminophen  (TYLENOL ) 500 MG tablet, Take 2 tablets (1,000 mg total) by mouth every 6 (six) hours as needed for mild pain or fever. (Patient taking differently: Take 500 mg by mouth every 6 (six) hours as needed for mild pain (pain score 1-3) or fever.), Disp: 30 tablet, Rfl: 0   amLODipine  (NORVASC ) 10 MG tablet, Take 10 mg by mouth daily., Disp: , Rfl:    apixaban  (ELIQUIS ) 5 MG TABS tablet, Take 1 tablet (5 mg total) by mouth 2 (two) times daily., Disp: 60 tablet, Rfl: 5   benzonatate (TESSALON) 100 MG capsule, Take 100 mg by mouth daily as needed for cough.,  Disp: , Rfl:    DOCUSATE SODIUM  PO, Take 2-3 capsules by mouth at bedtime., Disp: , Rfl:    famotidine  (PEPCID ) 20 MG tablet, Take 1 tablet (20 mg total) by mouth 2 (two) times daily., Disp: 60 tablet, Rfl: 0   lansoprazole  (PREVACID  SOLUTAB) 30 MG disintegrating tablet, Take 30 mg by mouth 2 (two) times daily before a meal. Dissolve 1 tablet in mouth twice a day before meals (take on an empty stomach 30 minutes prior to a meal), Disp: , Rfl:    lidocaine  (LIDODERM ) 5 %, Place 1 patch onto the skin See admin instructions. Apply 1 patch to the skin once daily for back pain (apply for 12 hours, then remove for 12 hours) as needed, Disp: , Rfl:    Melatonin 10 MG TABS, Take 10 mg by mouth at bedtime. (Patient taking differently: Take 30 mg by mouth at bedtime.), Disp: , Rfl:    Multiple Vitamins-Minerals (MULTIVITAMINS/MINERALS ADULT PO), Take 1 each by mouth in the morning. Gummies, Disp: , Rfl:    Nutritional Supplements (FEEDING SUPPLEMENT, OSMOLITE 1.5 CAL,) LIQD, Place 1,000 mLs into feeding tube See admin instructions. Take 1 can by gastric tube every 3 hours, Disp: , Rfl:    ondansetron  (ZOFRAN -ODT) 8 MG disintegrating tablet, Take 1 tablet (8 mg total) by mouth every 8 (eight) hours as needed for nausea or vomiting., Disp: 90 tablet, Rfl: 3   pioglitazone (ACTOS) 30 MG tablet, Take 30 mg by mouth daily., Disp: , Rfl:    sertraline  (ZOLOFT ) 25 MG tablet, Take 1 tablet (25 mg total) by mouth daily. (Patient taking differently: Take 100 mg by mouth daily.), Disp: 90 tablet, Rfl: 3   simethicone (MYLICON) 125 MG chewable tablet, Chew 250 mg by mouth every 6 (six) hours as needed for flatulence., Disp: , Rfl:    sitaGLIPtin (JANUVIA) 50 MG tablet, Take 50 mg by mouth at bedtime., Disp: , Rfl:    SUMAtriptan (IMITREX) 25 MG tablet, Take 25 mg by mouth., Disp: , Rfl:    Physical Exam:   BP (!) 158/86 (BP Location: Left Wrist, Patient Position: Sitting, Cuff Size: Large)   Pulse 81   Ht 6' (1.829 m)    Wt (!) 320 lb (145.2 kg)   SpO2 92%   BMI 43.40 kg/m   Salient findings:  CN II-XII intact Right epiphora/some thick secretions around NL punctum Bilateral EAC clear and TM intact with well pneumatized middle ear spaces Anterior rhinoscopy: Septum intact; bilateral inferior turbinates without significant hypertrophy No lesions of oral cavity/oropharynx; edentulous; friedman Tongue 3; shorter palate, no tonsils, uvula not very long No obviously palpable neck masses/lymphadenopathy/thyromegaly No respiratory distress or stridor  Seprately Identifiable Procedures:  Prior to initiating any procedures, risks/benefits/alternatives were explained to the patient and verbal consent  obtained. Procedure Note Pre-procedure diagnosis:  Obstructive sleep apnea, rule out structural cause Post-procedure diagnosis: Same Procedure: Transnasal Fiberoptic Laryngoscopy, CPT 31575 - Mod 25 Indication: see above Complications: None apparent EBL: 0 mL  The procedure was undertaken to further evaluate the patient's complaint above, with mirror exam inadequate for appropriate examination due to gag reflex and poor patient tolerance  Procedure:  Patient was identified as correct patient. Verbal consent was obtained. The nose was sprayed with oxymetazoline and 4% lidocaine . The The flexible laryngoscope was passed through the nose to view the nasal cavity, pharynx (oropharynx, hypopharynx) and larynx.  The larynx was examined at rest and during multiple phonatory tasks. Documentation was obtained and reviewed with patient. The scope was removed. The patient tolerated the procedure well.  Findings: The nasal cavity and nasopharynx did not reveal any masses or lesions, mucosa appeared to be without obvious lesions. No masses over right inferior turbinate or lesions -- no purulence. The tongue base, pharyngeal walls, piriform sinuses, vallecula, epiglottis and postcricoid region are normal in appearance; Muller  maneuver neg; The visualized portion of the subglottis and proximal trachea is widely patent. The vocal folds are mobile bilaterally. There are no lesions on the free edge of the vocal folds nor elsewhere in the larynx worrisome for malignancy.    Electronically signed by: Eldora KATHEE Blanch, MD 06/12/2024 2:19 PM   Impression & Plans:  Keith Moreno is a 63 y.o. male with:  1. OSA (obstructive sleep apnea)   2. Right epiphora    BMI > 40, tonsils absent, short palate and uvula it appears. He is not interested but has not tried CPAP. Given his AHI and habitus, do not think single level surgery with UPPP would guarantee significant benefit. As such, would recommend at least trial of CPAP (perhaps nasal pillow?) to see if it helps.  Epiphora: no nasal masses noted -- recommend referral to oculoplastics to cannulate duct to see if plugged; no nasal sx  See below regarding exact medications prescribed this encounter including dosages and route: No orders of the defined types were placed in this encounter.     Thank you for allowing me the opportunity to care for your patient. Please do not hesitate to contact me should you have any other questions.  Sincerely, Eldora Blanch, MD Otolaryngologist (ENT), [facility] Phone: [phone] Fax: [phone]  06/12/2024, 2:19 PM   MDM:  Level 4 - [phone] Complexity/Problems addressed: mod - multiple chronic issues Data complexity: mod  independent review of note, lab, test - Morbidity: low  - Prescription Drug prescribed or managed: n

## 2024-07-13 ENCOUNTER — Other Ambulatory Visit: Payer: Self-pay

## 2024-07-13 ENCOUNTER — Encounter (HOSPITAL_COMMUNITY): Payer: Self-pay | Admitting: Pharmacy Technician

## 2024-07-13 ENCOUNTER — Emergency Department (HOSPITAL_COMMUNITY)
Admission: EM | Admit: 2024-07-13 | Discharge: 2024-07-13 | Disposition: A | Discharge status: Home / Self Care | Attending: Emergency Medicine | Admitting: Emergency Medicine

## 2024-07-13 DIAGNOSIS — Z8501 Personal history of malignant neoplasm of esophagus: Secondary | ICD-10-CM | POA: Diagnosis not present

## 2024-07-13 DIAGNOSIS — Z7901 Long term (current) use of anticoagulants: Secondary | ICD-10-CM | POA: Insufficient documentation

## 2024-07-13 DIAGNOSIS — K9413 Enterostomy malfunction: Secondary | ICD-10-CM | POA: Insufficient documentation

## 2024-07-13 DIAGNOSIS — T85528A Displacement of other gastrointestinal prosthetic devices, implants and grafts, initial encounter: Secondary | ICD-10-CM

## 2024-07-13 MED ORDER — LACTATED RINGERS IV BOLUS
1000.0000 mL | Freq: Once | INTRAVENOUS | Status: AC
  Administered 2024-07-13: 1000 mL via INTRAVENOUS

## 2024-07-13 NOTE — ED Provider Notes (Signed)
 " Keith Moreno EMERGENCY DEPARTMENT AT [facility] Provider Note   CSN: [id number] Arrival date & time: 07/13/24  1341     Patient presents with: No chief complaint on file.   Keith Moreno is a 64 y.o. male.   Patient is a 64 year old male with a past medical history of esophageal cancer with dysphagia to liquids with J-tube in place presenting to the emergency department with displaced J-tube.  The patient states that around 11 AM this morning his J-tube fell out.  He states that he thinks the balloon deflated and it did not get caught on anything.  He denies any abdominal pain.  He states that he is able to swallow solids and pills.  The history is provided by the patient.       Prior to Admission medications  Medication Sig Start Date End Date Taking? Authorizing Provider  acetaminophen  (TYLENOL ) 500 MG tablet Take 2 tablets (1,000 mg total) by mouth every 6 (six) hours as needed for mild pain or fever. Patient taking differently: Take 500 mg by mouth every 6 (six) hours as needed for mild pain (pain score 1-3) or fever. 08/11/22   Barrett, Erin R, PA-C  amLODipine  (NORVASC ) 10 MG tablet Take 10 mg by mouth daily. 07/10/22   [provider]  apixaban  (ELIQUIS ) 5 MG TABS tablet Take 1 tablet (5 mg total) by mouth 2 (two) times daily. 02/05/24   Federico Norleen ONEIDA MADISON, MD  benzonatate (TESSALON) 100 MG capsule Take 100 mg by mouth daily as needed for cough.    [provider]  DOCUSATE SODIUM  PO Take 2-3 capsules by mouth at bedtime.    [provider]  famotidine  (PEPCID ) 20 MG tablet Take 1 tablet (20 mg total) by mouth 2 (two) times daily. 05/15/23   Joshua Domino, DO  lansoprazole  (PREVACID  SOLUTAB) 30 MG disintegrating tablet Take 30 mg by mouth 2 (two) times daily before a meal. Dissolve 1 tablet in mouth twice a day before meals (take on an empty stomach 30 minutes prior to a meal)    [provider]  lidocaine  (LIDODERM ) 5 % Place 1 patch onto  the skin See admin instructions. Apply 1 patch to the skin once daily for back pain (apply for 12 hours, then remove for 12 hours) as needed    [provider]  Melatonin 10 MG TABS Take 10 mg by mouth at bedtime. Patient taking differently: Take 30 mg by mouth at bedtime. 05/15/23   McDiarmid, Krystal BIRCH, MD  Multiple Vitamins-Minerals (MULTIVITAMINS/MINERALS ADULT PO) Take 1 each by mouth in the morning. Gummies    [provider]  Nutritional Supplements (FEEDING SUPPLEMENT, OSMOLITE 1.5 CAL,) LIQD Place 1,000 mLs into feeding tube See admin instructions. Take 1 can by gastric tube every 3 hours    [provider]  ondansetron  (ZOFRAN -ODT) 8 MG disintegrating tablet Take 1 tablet (8 mg total) by mouth every 8 (eight) hours as needed for nausea or vomiting. 12/15/21   Thayil, Irene T, PA-C  pioglitazone (ACTOS) 30 MG tablet Take 30 mg by mouth daily.    [provider]  sertraline  (ZOLOFT ) 25 MG tablet Take 1 tablet (25 mg total) by mouth daily. Patient taking differently: Take 100 mg by mouth daily. 02/03/24   Dorsey, John T IV, MD  simethicone (MYLICON) 125 MG chewable tablet Chew 250 mg by mouth every 6 (six) hours as needed for flatulence.    [provider]  sitaGLIPtin (JANUVIA) 50 MG tablet  Take 50 mg by mouth at bedtime.    [provider]  SUMAtriptan (IMITREX) 25 MG tablet Take 25 mg by mouth. 05/22/24   [provider]    Allergies: Procaine, Chocolate, Lisinopril, and Other    Review of Systems  Updated Vital Signs BP (!) 153/84   Pulse 88   Temp 98 F (36.7 C) (Oral)   Resp 18   SpO2 97%   Physical Exam Vitals and nursing note reviewed.  Constitutional:      General: He is not in acute distress.    Appearance: Normal appearance. He is obese.  HENT:     Head: Normocephalic.     Nose: Nose normal.     Mouth/Throat:     Mouth: Mucous membranes are moist.  Eyes:     Extraocular Movements: Extraocular movements  intact.     Conjunctiva/sclera: Conjunctivae normal.  Cardiovascular:     Rate and Rhythm: Normal rate and regular rhythm.     Heart sounds: Normal heart sounds.  Pulmonary:     Effort: Pulmonary effort is normal.     Breath sounds: Normal breath sounds.  Abdominal:     General: Abdomen is flat.     Palpations: Abdomen is soft.     Tenderness: There is no abdominal tenderness.     Comments: J tube stoma patent appearing, no surrounding erythema, warmth or drainage.  Musculoskeletal:        General: Normal range of motion.     Cervical back: Normal range of motion.  Skin:    General: Skin is warm and dry.  Neurological:     General: No focal deficit present.     Mental Status: He is alert and oriented to person, place, and time.  Psychiatric:        Mood and Affect: Mood normal.        Behavior: Behavior normal.     (all labs ordered are listed, but only abnormal results are displayed) Labs Reviewed - No data to display  EKG: None  Radiology: No results found.   Procedures   Medications Ordered in the ED  lactated ringers  bolus 1,000 mL (has no administration in time range)    Clinical Course as of 07/13/24 1629  Sun Jul 13, 2024  1522 I spoke with Dr. Jenna with IR - could be admitted to hospitalist for IR eval order or Brittany [facility]une[facility] and [employer]. If theyr'e available could be done today or they can schedule him for outpatient [VK]  1530 I spoke with Clotilda Hesselbach PA with IR who states they are no longer on site to replace today. Will have schedulers call in the morning, NPO after MN. Requested to place foley in the tract if able to help preserve the tract prior to dc.  [VK]  1613 18 gauge foley catheter placed in stoma site by myself to keep stoma patent. Patient instructed not to give anything through the tube overnight. Wife is concerned about him getting dehydrated tonight and requested IVF prior to discharge. They were given strict return  precautions.  [VK]    Clinical Course User Index [VK] Kingsley, Leeya Rusconi K, DO                                 Medical Decision Making This patient presents to the ED with chief complaint(s) of displaced J tube with pertinent past medical history of esophageal cancer with dysphagia  and J-tube in place which further complicates the presenting complaint. The complaint involves an extensive differential diagnosis and also carries with it a high risk of complications and morbidity.    The differential diagnosis includes displaced J-tube, no signs of infection, no signs of perforation   Additional history obtained: Additional history obtained from spouse Records reviewed N/A  ED Course and Reassessment: On patient's arrival he is hemodynamically stable in no acute distress.  Will discuss with IR about how to facilitate getting his tube replaced.  No signs of infection or other complications at this time.  Independent labs interpretation:  N/A  Independent visualization of imaging: -N/A  Consultation: - Consulted or discussed management/test interpretation w/ external professional: IR  Consideration for admission or further workup: Patient has no emergent conditions requiring admission or further work-up at this time and is stable for discharge home with IR follow-up  Social Determinants of health: N/A         Final diagnoses:  Jejunostomy tube fell out    ED Discharge Orders     None          Ellouise Richerd POUR, DO 07/13/24 1629  "

## 2024-07-13 NOTE — Discharge Instructions (Signed)
 You were seen in the emergency department after your J-tube fell out.  IR is unavailable to replace it today but they are scheduling you for an outpatient replacement tomorrow morning.  You should receive a call from them in the morning for your appointment time and location.  I have given you their number if you do not hear from them.  You should not eat or drink anything after midnight tonight.  We did place a Foley catheter in your stoma site to help keep it open but do not put any feeds or medications through this tube.  You can return to the emergency department for any new or concerning symptoms.

## 2024-07-13 NOTE — ED Triage Notes (Signed)
 Pt here with complaints of J tube coming out this morning.

## 2024-07-14 ENCOUNTER — Inpatient Hospital Stay (HOSPITAL_COMMUNITY)
Admission: EM | Admit: 2024-07-14 | Discharge: 2024-07-17 | DRG: 920 | Disposition: A | Discharge status: Home / Self Care | Attending: Internal Medicine | Admitting: Internal Medicine

## 2024-07-14 ENCOUNTER — Other Ambulatory Visit: Payer: Self-pay

## 2024-07-14 ENCOUNTER — Emergency Department (HOSPITAL_COMMUNITY)

## 2024-07-14 ENCOUNTER — Ambulatory Visit (HOSPITAL_COMMUNITY)
Admission: RE | Admit: 2024-07-14 | Discharge: 2024-07-14 | Disposition: A | Discharge status: Home / Self Care | Source: Ambulatory Visit | Attending: Radiology | Admitting: Radiology

## 2024-07-14 DIAGNOSIS — Y838 Other surgical procedures as the cause of abnormal reaction of the patient, or of later complication, without mention of misadventure at the time of the procedure: Secondary | ICD-10-CM | POA: Diagnosis present

## 2024-07-14 DIAGNOSIS — T85528A Displacement of other gastrointestinal prosthetic devices, implants and grafts, initial encounter: Principal | ICD-10-CM | POA: Diagnosis present

## 2024-07-14 DIAGNOSIS — K9413 Enterostomy malfunction: Secondary | ICD-10-CM | POA: Diagnosis present

## 2024-07-14 DIAGNOSIS — Z934 Other artificial openings of gastrointestinal tract status: Secondary | ICD-10-CM

## 2024-07-14 DIAGNOSIS — Z87891 Personal history of nicotine dependence: Secondary | ICD-10-CM

## 2024-07-14 DIAGNOSIS — Z833 Family history of diabetes mellitus: Secondary | ICD-10-CM

## 2024-07-14 DIAGNOSIS — Z87442 Personal history of urinary calculi: Secondary | ICD-10-CM

## 2024-07-14 DIAGNOSIS — Z9049 Acquired absence of other specified parts of digestive tract: Secondary | ICD-10-CM

## 2024-07-14 DIAGNOSIS — Z8616 Personal history of COVID-19: Secondary | ICD-10-CM

## 2024-07-14 DIAGNOSIS — Z79899 Other long term (current) drug therapy: Secondary | ICD-10-CM

## 2024-07-14 DIAGNOSIS — R748 Abnormal levels of other serum enzymes: Secondary | ICD-10-CM | POA: Diagnosis present

## 2024-07-14 DIAGNOSIS — Z818 Family history of other mental and behavioral disorders: Secondary | ICD-10-CM

## 2024-07-14 DIAGNOSIS — R1310 Dysphagia, unspecified: Secondary | ICD-10-CM

## 2024-07-14 DIAGNOSIS — Z8249 Family history of ischemic heart disease and other diseases of the circulatory system: Secondary | ICD-10-CM

## 2024-07-14 DIAGNOSIS — Z8052 Family history of malignant neoplasm of bladder: Secondary | ICD-10-CM

## 2024-07-14 DIAGNOSIS — Z8051 Family history of malignant neoplasm of kidney: Secondary | ICD-10-CM

## 2024-07-14 DIAGNOSIS — Z7984 Long term (current) use of oral hypoglycemic drugs: Secondary | ICD-10-CM

## 2024-07-14 DIAGNOSIS — K56609 Unspecified intestinal obstruction, unspecified as to partial versus complete obstruction: Principal | ICD-10-CM | POA: Diagnosis present

## 2024-07-14 DIAGNOSIS — E66813 Obesity, class 3: Secondary | ICD-10-CM | POA: Diagnosis present

## 2024-07-14 DIAGNOSIS — Z6841 Body Mass Index (BMI) 40.0 and over, adult: Secondary | ICD-10-CM

## 2024-07-14 DIAGNOSIS — Z800 Family history of malignant neoplasm of digestive organs: Secondary | ICD-10-CM

## 2024-07-14 DIAGNOSIS — Z8501 Personal history of malignant neoplasm of esophagus: Secondary | ICD-10-CM

## 2024-07-14 DIAGNOSIS — F32A Depression, unspecified: Secondary | ICD-10-CM | POA: Diagnosis present

## 2024-07-14 DIAGNOSIS — Z9889 Other specified postprocedural states: Secondary | ICD-10-CM

## 2024-07-14 DIAGNOSIS — D689 Coagulation defect, unspecified: Secondary | ICD-10-CM | POA: Diagnosis present

## 2024-07-14 DIAGNOSIS — I10 Essential (primary) hypertension: Secondary | ICD-10-CM | POA: Diagnosis present

## 2024-07-14 DIAGNOSIS — Z434 Encounter for attention to other artificial openings of digestive tract: Secondary | ICD-10-CM

## 2024-07-14 DIAGNOSIS — F419 Anxiety disorder, unspecified: Secondary | ICD-10-CM | POA: Diagnosis present

## 2024-07-14 DIAGNOSIS — E119 Type 2 diabetes mellitus without complications: Secondary | ICD-10-CM | POA: Diagnosis present

## 2024-07-14 DIAGNOSIS — I4892 Unspecified atrial flutter: Secondary | ICD-10-CM | POA: Diagnosis present

## 2024-07-14 DIAGNOSIS — Z7901 Long term (current) use of anticoagulants: Secondary | ICD-10-CM

## 2024-07-14 HISTORY — PX: IR REPLC DUODEN/JEJUNO TUBE PERCUT W/FLUORO: IMG2334

## 2024-07-14 LAB — COMPREHENSIVE METABOLIC PANEL WITH GFR
ALT: 25 U/L (ref 0–44)
AST: 24 U/L (ref 15–41)
Albumin: 4.3 g/dL (ref 3.5–5.0)
Alkaline Phosphatase: 141 U/L — ABNORMAL HIGH (ref 38–126)
Anion gap: 13 (ref 5–15)
BUN: 24 mg/dL — ABNORMAL HIGH (ref 8–23)
CO2: 22 mmol/L (ref 22–32)
Calcium: 9.8 mg/dL (ref 8.9–10.3)
Chloride: 103 mmol/L (ref 98–111)
Creatinine, Ser: 0.84 mg/dL (ref 0.61–1.24)
GFR, Estimated: >60 mL/min
Glucose, Bld: 150 mg/dL — ABNORMAL HIGH (ref 70–99)
Potassium: 4 mmol/L (ref 3.5–5.1)
Sodium: 139 mmol/L (ref 135–145)
Total Bilirubin: 0.5 mg/dL (ref 0.0–1.2)
Total Protein: 7.8 g/dL (ref 6.5–8.1)

## 2024-07-14 LAB — CBC
HCT: 43.8 % (ref 39.0–52.0)
Hemoglobin: 14.3 g/dL (ref 13.0–17.0)
MCH: 29.4 pg (ref 26.0–34.0)
MCHC: 32.6 g/dL (ref 30.0–36.0)
MCV: 90.1 fL (ref 80.0–100.0)
Platelets: 222 K/uL (ref 150–400)
RBC: 4.86 MIL/uL (ref 4.22–5.81)
RDW: 14.5 % (ref 11.5–15.5)
WBC: 10.7 K/uL — ABNORMAL HIGH (ref 4.0–10.5)
nRBC: 0 % (ref 0.0–0.2)

## 2024-07-14 LAB — LIPASE, BLOOD: Lipase: 450 U/L — ABNORMAL HIGH (ref 11–51)

## 2024-07-14 MED ORDER — ONDANSETRON HCL 4 MG/2ML IJ SOLN
4.0000 mg | Freq: Once | INTRAMUSCULAR | Status: AC
  Administered 2024-07-14: 4 mg via INTRAVENOUS
  Filled 2024-07-14: qty 2

## 2024-07-14 MED ORDER — IOHEXOL 300 MG/ML  SOLN
10.0000 mL | Freq: Once | INTRAMUSCULAR | Status: AC | PRN
  Administered 2024-07-14: 10 mL

## 2024-07-14 MED ORDER — IOHEXOL 300 MG/ML  SOLN: 50.0000 mL | Freq: Once | INTRAMUSCULAR | Status: DC | PRN

## 2024-07-14 MED ORDER — MORPHINE SULFATE (PF) 4 MG/ML IV SOLN
6.0000 mg | Freq: Once | INTRAVENOUS | Status: AC
  Administered 2024-07-14: 6 mg via INTRAVENOUS
  Filled 2024-07-14: qty 2

## 2024-07-14 MED ORDER — IOHEXOL 300 MG/ML  SOLN
100.0000 mL | Freq: Once | INTRAMUSCULAR | Status: AC | PRN
  Administered 2024-07-14: 100 mL via INTRAVENOUS

## 2024-07-14 NOTE — ED Provider Notes (Signed)
 " [facility]ERGENC[facility] DEPARTMENT AT [facility] Provider Note   CSN: [id number] Arrival date & time: 07/14/24  2144     Patient presents with: Abdominal Pain   Keith Moreno is a 64 y.o. male.   Patient to ED with pain across the upper abdomen, nausea, vomiting, that started this afternoon. He has a history of esophageal CA, J-tube placement for dysphagia to liquids (can eat solids) that dislodged yesterday and was replaced by IR this morning. Current symptoms started after J-tube placement today. No fever. Last bowel movement this morning and he continues to pass gas. Wife at bedside noted his blood pressure escalated through the day and this evening he became diaphoretic prompting ED evaluation. No chest pain or SOB, but he reports taking a deep breath causes increased pain in the abdomen. No hematemesis.  The history is provided by the patient and the spouse. No language interpreter was used.  Abdominal Pain      Prior to Admission medications  Medication Sig Start Date End Date Taking? Authorizing Provider  acetaminophen  (TYLENOL ) 500 MG tablet Take 2 tablets (1,000 mg total) by mouth every 6 (six) hours as needed for mild pain or fever. Patient taking differently: Take 500 mg by mouth every 6 (six) hours as needed for mild pain (pain score 1-3) or fever. 08/11/22   Barrett, Erin R, PA-C  amLODipine  (NORVASC ) 10 MG tablet Take 10 mg by mouth daily. 07/10/22   [provider]  apixaban  (ELIQUIS ) 5 MG TABS tablet Take 1 tablet (5 mg total) by mouth 2 (two) times daily. 02/05/24   Federico Norleen ONEIDA MADISON, MD  benzonatate (TESSALON) 100 MG capsule Take 100 mg by mouth daily as needed for cough.    [provider]  DOCUSATE SODIUM  PO Take 2-3 capsules by mouth at bedtime.    [provider]  famotidine  (PEPCID ) 20 MG tablet Take 1 tablet (20 mg total) by mouth 2 (two) times daily. 05/15/23   Joshua Domino, DO  lansoprazole  (PREVACID  SOLUTAB) 30 MG  disintegrating tablet Take 30 mg by mouth 2 (two) times daily before a meal. Dissolve 1 tablet in mouth twice a day before meals (take on an empty stomach 30 minutes prior to a meal)    [provider]  lidocaine  (LIDODERM ) 5 % Place 1 patch onto the skin See admin instructions. Apply 1 patch to the skin once daily for back pain (apply for 12 hours, then remove for 12 hours) as needed    [provider]  Melatonin 10 MG TABS Take 10 mg by mouth at bedtime. Patient taking differently: Take 30 mg by mouth at bedtime. 05/15/23   McDiarmid, Krystal BIRCH, MD  Multiple Vitamins-Minerals (MULTIVITAMINS/MINERALS ADULT PO) Take 1 each by mouth in the morning. Gummies    [provider]  Nutritional Supplements (FEEDING SUPPLEMENT, OSMOLITE 1.5 CAL,) LIQD Place 1,000 mLs into feeding tube See admin instructions. Take 1 can by gastric tube every 3 hours    [provider]  ondansetron  (ZOFRAN -ODT) 8 MG disintegrating tablet Take 1 tablet (8 mg total) by mouth every 8 (eight) hours as needed for nausea or vomiting. 12/15/21   Thayil, Irene T, PA-C  pioglitazone (ACTOS) 30 MG tablet Take 30 mg by mouth daily.    [provider]  sertraline  (ZOLOFT ) 25 MG tablet Take 1 tablet (25 mg total) by mouth daily. Patient taking differently: Take 100 mg by mouth daily. 02/03/24   Dorsey, John T IV, MD  simethicone (  MYLICON) 125 MG chewable tablet Chew 250 mg by mouth every 6 (six) hours as needed for flatulence.    [provider]  sitaGLIPtin (JANUVIA) 50 MG tablet Take 50 mg by mouth at bedtime.    [provider]  SUMAtriptan (IMITREX) 25 MG tablet Take 25 mg by mouth. 05/22/24   [provider]    Allergies: Procaine, Chocolate, Lisinopril, and Other    Review of Systems  Gastrointestinal:  Positive for abdominal pain.    Updated Vital Signs BP (!) 183/97   Pulse 81   Temp (!) 97.5 F (36.4 C) (Oral)   Resp 16   SpO2 91%   Physical Exam Vitals  and nursing note reviewed.  Constitutional:      Appearance: He is obese. He is not ill-appearing or diaphoretic.  Cardiovascular:     Rate and Rhythm: Normal rate and regular rhythm.     Heart sounds: No murmur heard. Pulmonary:     Effort: Pulmonary effort is normal.     Breath sounds: No wheezing, rhonchi or rales.  Chest:     Chest wall: No tenderness.  Abdominal:     Palpations: Abdomen is soft.     Tenderness: There is abdominal tenderness in the right upper quadrant, epigastric area and left upper quadrant.     Comments: J-tube in LUQ. Bandage dry. Abdomen soft. +BS  Skin:    General: Skin is warm and dry.  Neurological:     Mental Status: He is alert.     (all labs ordered are listed, but only abnormal results are displayed) Labs Reviewed  LIPASE, BLOOD - Abnormal; Notable for the following components:      Result Value   Lipase 450 (*)    All other components within normal limits  COMPREHENSIVE METABOLIC PANEL WITH GFR - Abnormal; Notable for the following components:   Glucose, Bld 150 (*)    BUN 24 (*)    Alkaline Phosphatase 141 (*)    All other components within normal limits  CBC - Abnormal; Notable for the following components:   WBC 10.7 (*)    All other components within normal limits  URINALYSIS, ROUTINE W REFLEX MICROSCOPIC    EKG: None  Radiology: CT ABDOMEN PELVIS W CONTRAST Result Date: 07/15/2024 EXAM: CT ABDOMEN AND PELVIS WITH CONTRAST 07/14/2024 11:51:31 PM TECHNIQUE: CT of the abdomen and pelvis was performed with the administration of 100 mL of iohexol  (OMNIPAQUE ) 300 MG/ML solution. Multiplanar reformatted images are provided for review. Automated exposure control, iterative reconstruction, and/or weight-based adjustment of the mA/kV was utilized to reduce the radiation dose to as low as reasonably achievable. COMPARISON: MRI abdomen 04/04/2024 and CT chest abdomen and pelvis 03/24/2024. CLINICAL HISTORY: Abdominal pain, post-op. FINDINGS: LOWER  CHEST: Atelectasis in the right middle lobe and right lower lobe. Calcified bilateral hilar lymph nodes. The intrathoracic portion of the stomach appears moderately distended with fluid. LIVER: Scattered round hypodensities, too small to characterize, likely cysts. GALLBLADDER AND BILE DUCTS: Gallbladder is unremarkable. No biliary ductal dilatation. SPLEEN: No acute abnormality. PANCREAS: No acute abnormality. ADRENAL GLANDS: No acute abnormality. KIDNEYS, URETERS AND BLADDER: Bilateral renal calculi appear unchanged. No hydronephrosis. No perinephric or periureteral stranding. Urinary bladder is unremarkable. GI AND BOWEL: Patient is status post gastric pull up procedure. The intrathoracic portion of the stomach appears moderately distended with fluid. The intraabdominal portion of the stomach is dilated with air fluid level as well as the duodenum. There are dilated jejunal loops with air fluid  levels measuring up to 4.7 cm. Transition point is seen against the anterior left mid abdomen against the abdominal wall proximal to insertion site of the jejunostomy tube. Findings are compatible with small bowel obstruction. A percutaneous jejunostomy tube is in place with distal tip within jejunal loops in the central abdomen. These loops of bowel appear nondilated. The appendix appears within normal limits. There is sigmoid and descending colon diverticulosis. No free air identified. PERITONEUM AND RETROPERITONEUM: No ascites. No free air. VASCULATURE: Aorta is normal in caliber. LYMPH NODES: No lymphadenopathy. REPRODUCTIVE ORGANS: No acute abnormality. BONES AND SOFT TISSUES: No acute osseous abnormality. There is a small fat containing right inguinal hernia. IMPRESSION: 1. Small bowel obstruction with transition point in the anterior left mid abdomen near the jejunostomy tube insertion site. Nofree air. 2. Status post gastric pull up procedure with moderately distended intrathoracic stomach and dilated  intraabdominal stomach and duodenum. 3. Sigmoid and descending colon diverticulosis without evidence of diverticulitis. 4. Bilateral renal calculi, unchanged, without hydronephrosis. Electronically signed by: Greig Pique MD 07/15/2024 12:01 AM EST RP Workstation: [id number]   IR Replc Duoden/Jejuno Luetta Percut W/Fluoro Result Date: 07/14/2024 INDICATION: Dislodged chronic indwelling 18 French jejunostomy tube. A Foley catheter was placed in the tract yesterday. EXAM: REPLACEMENT JEJUNOSTOMY TUBE UNDER FLUOROSCOPIC GUIDANCE MEDICATIONS: None ANESTHESIA/SEDATION: None CONTRAST:  10 mL Omnipaque  300 - administered into the gastric lumen. FLUOROSCOPY TIME:  Radiation exposure index: 72 mGy Kerma COMPLICATIONS: None immediate. PROCEDURE: Informed written consent was obtained from the patient after a thorough discussion of the procedural risks, benefits and alternatives. All questions were addressed. Maximal Sterile Barrier Technique was utilized including caps, mask, sterile gowns, sterile gloves, sterile drape, hand hygiene and skin antiseptic. A timeout was performed prior to the initiation of the procedure. The gastrojejunal tube exit site was probed with a 5 French Kumpe the catheter. The catheter was advanced under fluoroscopy into the jejunum. The catheter was further advanced into small bowel over a hydrophilic guidewire. An 51 French balloon retention jejunostomy catheter was trimmed to appropriate length and advanced over the wire. Final catheter position was confirmed by fluoroscopy 8 and contrast injection to confirm position. The retention balloon was inflated with approximately 9 mL of saline. The tube was flushed with saline. FINDINGS: By the time the patient arrive for tube replacement, the Foley catheter placed in the Emergency Department yesterday had fallen out. The jejunostomy tract was able to be catheterized easily with a 5 French catheter back into the jejunal lumen. A new 18 French balloon  retention jejunal catheter was advanced well into the jejunum with position confirmed by contrast injection and a fluoroscopic image. The catheter is ready for immediate use. IMPRESSION: Replacement of dislodged jejunostomy catheter under fluoroscopic guidance with new 18 French balloon retention jejunostomy tube. The catheter tip is well into the jejunum and the catheter is ready for immediate use. Electronically Signed   By: Marcey Moan M.D.   On: 07/14/2024 14:27     Procedures   Medications Ordered in the ED  ondansetron  (ZOFRAN ) injection 4 mg (4 mg Intravenous Given 07/14/24 2314)  morphine  (PF) 4 MG/ML injection 6 mg (6 mg Intravenous Given 07/14/24 2314)  iohexol  (OMNIPAQUE ) 300 MG/ML solution 100 mL (100 mLs Intravenous Contrast Given 07/14/24 2337)    Clinical Course as of 07/15/24 0037  Mon Jul 14, 2024  2318 Patient to ED with onset upper abdominal pain, nausea, vomiting after IR replacement of J-tube earlier today. No fever, but wife reports he became  diaphoretic this evening. No CP. Labs reassuring. CT abd ordered. Pain and nausea addressed.  [SU]  Tue Jul 15, 2024  0009 CT abd/pel per radiology: IMPRESSION: 1. Small bowel obstruction with transition point in the anterior left mid abdomen near the jejunostomy tube insertion site. Nofree air. 2. Status post gastric pull up procedure with moderately distended intrathoracic stomach and dilated intraabdominal stomach and duodenum. 3. Sigmoid and descending colon diverticulosis without evidence of diverticulitis. 4. Bilateral renal calculi, unchanged, without hydronephrosis.  NG tube ordered, surgery paged. Anticipate admit to medicine.  [SU]  [id number] Discussed with Dr. Aron, gen surg, who will provide consultation. Admit to hospitalist. Noted, patient is on eliquis  but stopped yesterday due to IR procedure today. NG tube placed.  [SU]    Clinical Course User Index [SU] Odell Balls, PA-C                                  Medical Decision Making Amount and/or Complexity of Data Reviewed Labs: ordered. Radiology: ordered.  Risk Prescription drug management.        Final diagnoses:  SBO (small bowel obstruction) [facility])  Coagulopathy    ED Discharge Orders     None          Odell Balls, PA-C 07/15/24 0037    Ula Prentice SAUNDERS, MD 07/17/24 1510  "

## 2024-07-14 NOTE — ED Triage Notes (Signed)
 Patient BIB EMS from home c/o generalized abdominal pain after he ate dinner tonight. Patient report nausea and vomiting x 3 today. Patient report taking TUMS and gasex for pain. Patient denies dysuria. Patient report J tube was change today.  BP 152/82 HR 78 RR 20 O2sat 96 on RA CBG 139

## 2024-07-15 ENCOUNTER — Emergency Department (HOSPITAL_COMMUNITY)

## 2024-07-15 ENCOUNTER — Inpatient Hospital Stay (HOSPITAL_COMMUNITY)

## 2024-07-15 ENCOUNTER — Encounter (HOSPITAL_COMMUNITY): Payer: Self-pay | Admitting: Internal Medicine

## 2024-07-15 DIAGNOSIS — F32A Depression, unspecified: Secondary | ICD-10-CM | POA: Diagnosis present

## 2024-07-15 DIAGNOSIS — Z8249 Family history of ischemic heart disease and other diseases of the circulatory system: Secondary | ICD-10-CM | POA: Diagnosis not present

## 2024-07-15 DIAGNOSIS — Z87442 Personal history of urinary calculi: Secondary | ICD-10-CM | POA: Diagnosis not present

## 2024-07-15 DIAGNOSIS — E119 Type 2 diabetes mellitus without complications: Secondary | ICD-10-CM

## 2024-07-15 DIAGNOSIS — Z8616 Personal history of COVID-19: Secondary | ICD-10-CM | POA: Diagnosis not present

## 2024-07-15 DIAGNOSIS — I10 Essential (primary) hypertension: Secondary | ICD-10-CM

## 2024-07-15 DIAGNOSIS — Z7901 Long term (current) use of anticoagulants: Secondary | ICD-10-CM | POA: Diagnosis not present

## 2024-07-15 DIAGNOSIS — Z79899 Other long term (current) drug therapy: Secondary | ICD-10-CM | POA: Diagnosis not present

## 2024-07-15 DIAGNOSIS — D689 Coagulation defect, unspecified: Secondary | ICD-10-CM | POA: Diagnosis present

## 2024-07-15 DIAGNOSIS — K56609 Unspecified intestinal obstruction, unspecified as to partial versus complete obstruction: Principal | ICD-10-CM

## 2024-07-15 DIAGNOSIS — E66813 Obesity, class 3: Secondary | ICD-10-CM | POA: Diagnosis present

## 2024-07-15 DIAGNOSIS — Z7984 Long term (current) use of oral hypoglycemic drugs: Secondary | ICD-10-CM | POA: Diagnosis not present

## 2024-07-15 DIAGNOSIS — Z8051 Family history of malignant neoplasm of kidney: Secondary | ICD-10-CM | POA: Diagnosis not present

## 2024-07-15 DIAGNOSIS — Z800 Family history of malignant neoplasm of digestive organs: Secondary | ICD-10-CM | POA: Diagnosis not present

## 2024-07-15 DIAGNOSIS — Z87891 Personal history of nicotine dependence: Secondary | ICD-10-CM | POA: Diagnosis not present

## 2024-07-15 DIAGNOSIS — R748 Abnormal levels of other serum enzymes: Secondary | ICD-10-CM | POA: Insufficient documentation

## 2024-07-15 DIAGNOSIS — Z833 Family history of diabetes mellitus: Secondary | ICD-10-CM | POA: Diagnosis not present

## 2024-07-15 DIAGNOSIS — Z8501 Personal history of malignant neoplasm of esophagus: Secondary | ICD-10-CM | POA: Diagnosis not present

## 2024-07-15 DIAGNOSIS — Z934 Other artificial openings of gastrointestinal tract status: Secondary | ICD-10-CM | POA: Diagnosis not present

## 2024-07-15 DIAGNOSIS — Z8052 Family history of malignant neoplasm of bladder: Secondary | ICD-10-CM | POA: Diagnosis not present

## 2024-07-15 DIAGNOSIS — I4892 Unspecified atrial flutter: Secondary | ICD-10-CM | POA: Diagnosis present

## 2024-07-15 DIAGNOSIS — Z6841 Body Mass Index (BMI) 40.0 and over, adult: Secondary | ICD-10-CM | POA: Diagnosis not present

## 2024-07-15 DIAGNOSIS — Z818 Family history of other mental and behavioral disorders: Secondary | ICD-10-CM | POA: Diagnosis not present

## 2024-07-15 DIAGNOSIS — T85528A Displacement of other gastrointestinal prosthetic devices, implants and grafts, initial encounter: Secondary | ICD-10-CM | POA: Diagnosis present

## 2024-07-15 DIAGNOSIS — Y838 Other surgical procedures as the cause of abnormal reaction of the patient, or of later complication, without mention of misadventure at the time of the procedure: Secondary | ICD-10-CM | POA: Diagnosis present

## 2024-07-15 DIAGNOSIS — F419 Anxiety disorder, unspecified: Secondary | ICD-10-CM | POA: Diagnosis present

## 2024-07-15 HISTORY — PX: IR PATIENT EVAL TECH 0-60 MINS: IMG5564

## 2024-07-15 LAB — BASIC METABOLIC PANEL WITH GFR
Anion gap: 8 (ref 5–15)
BUN: 19 mg/dL (ref 8–23)
CO2: 23 mmol/L (ref 22–32)
Calcium: 8 mg/dL — ABNORMAL LOW (ref 8.9–10.3)
Chloride: 110 mmol/L (ref 98–111)
Creatinine, Ser: 0.59 mg/dL — ABNORMAL LOW (ref 0.61–1.24)
GFR, Estimated: >60 mL/min
Glucose, Bld: 122 mg/dL — ABNORMAL HIGH (ref 70–99)
Potassium: 3.6 mmol/L (ref 3.5–5.1)
Sodium: 140 mmol/L (ref 135–145)

## 2024-07-15 LAB — CBC WITH DIFFERENTIAL/PLATELET
Abs Immature Granulocytes: 0.03 K/uL (ref 0.00–0.07)
Basophils Absolute: 0 K/uL (ref 0.0–0.1)
Basophils Relative: 0 %
Eosinophils Absolute: 0 K/uL (ref 0.0–0.5)
Eosinophils Relative: 0 %
HCT: 39.6 % (ref 39.0–52.0)
Hemoglobin: 13.1 g/dL (ref 13.0–17.0)
Immature Granulocytes: 0 %
Lymphocytes Relative: 5 %
Lymphs Abs: 0.4 K/uL — ABNORMAL LOW (ref 0.7–4.0)
MCH: 29.7 pg (ref 26.0–34.0)
MCHC: 33.1 g/dL (ref 30.0–36.0)
MCV: 89.8 fL (ref 80.0–100.0)
Monocytes Absolute: 0.4 K/uL (ref 0.1–1.0)
Monocytes Relative: 5 %
Neutro Abs: 7.1 K/uL (ref 1.7–7.7)
Neutrophils Relative %: 90 %
Platelets: 194 K/uL (ref 150–400)
RBC: 4.41 MIL/uL (ref 4.22–5.81)
RDW: 14.5 % (ref 11.5–15.5)
WBC: 7.9 K/uL (ref 4.0–10.5)
nRBC: 0 % (ref 0.0–0.2)

## 2024-07-15 LAB — URINALYSIS, ROUTINE W REFLEX MICROSCOPIC
Bacteria, UA: NONE SEEN
Bilirubin Urine: NEGATIVE
Glucose, UA: NEGATIVE mg/dL
Hgb urine dipstick: NEGATIVE
Ketones, ur: 5 mg/dL — AB
Leukocytes,Ua: NEGATIVE
Nitrite: NEGATIVE
Protein, ur: 30 mg/dL — AB
Specific Gravity, Urine: >1.046 — ABNORMAL HIGH (ref 1.005–1.030)
pH: 5 (ref 5.0–8.0)

## 2024-07-15 LAB — HEPATIC FUNCTION PANEL
ALT: 17 U/L (ref 0–44)
AST: 16 U/L (ref 15–41)
Albumin: 3.3 g/dL — ABNORMAL LOW (ref 3.5–5.0)
Alkaline Phosphatase: 105 U/L (ref 38–126)
Bilirubin, Direct: 0.2 mg/dL (ref 0.0–0.2)
Indirect Bilirubin: 0.2 mg/dL — ABNORMAL LOW (ref 0.3–0.9)
Total Bilirubin: 0.4 mg/dL (ref 0.0–1.2)
Total Protein: 5.8 g/dL — ABNORMAL LOW (ref 6.5–8.1)

## 2024-07-15 LAB — GLUCOSE, CAPILLARY
Glucose-Capillary: 99 mg/dL (ref 70–99)
Glucose-Capillary: 96 mg/dL (ref 70–99)
Glucose-Capillary: 96 mg/dL (ref 70–99)
Glucose-Capillary: 95 mg/dL (ref 70–99)

## 2024-07-15 LAB — HEMOGLOBIN A1C
Hgb A1c MFr Bld: 5.5 % (ref 4.8–5.6)
Mean Plasma Glucose: 111.15 mg/dL

## 2024-07-15 LAB — HEPARIN LEVEL (UNFRACTIONATED)
Heparin Unfractionated: 0.25 [IU]/mL — ABNORMAL LOW (ref 0.30–0.70)
Heparin Unfractionated: 0.5 [IU]/mL (ref 0.30–0.70)
Heparin Unfractionated: 0.49 [IU]/mL (ref 0.30–0.70)

## 2024-07-15 LAB — APTT
aPTT: 31 s (ref 24–36)
aPTT: 52 s — ABNORMAL HIGH (ref 24–36)

## 2024-07-15 LAB — HIV ANTIBODY (ROUTINE TESTING W REFLEX): HIV Screen 4th Generation wRfx: NONREACTIVE

## 2024-07-15 MED ORDER — INSULIN ASPART 100 UNIT/ML IJ SOLN: 0.0000 [IU] | INTRAMUSCULAR | Status: DC

## 2024-07-15 MED ORDER — HEPARIN (PORCINE) 25000 UT/250ML-% IV SOLN
1550.0000 [IU]/h | INTRAVENOUS | Status: AC
  Administered 2024-07-15 – 2024-07-17 (×3): 1550 [IU]/h via INTRAVENOUS
  Filled 2024-07-15 (×3): qty 250

## 2024-07-15 MED ORDER — HYDROMORPHONE HCL 1 MG/ML IJ SOLN
0.5000 mg | INTRAMUSCULAR | Status: DC | PRN
  Administered 2024-07-15 – 2024-07-16 (×3): 0.5 mg via INTRAVENOUS
  Filled 2024-07-15 (×3): qty 0.5

## 2024-07-15 MED ORDER — HEPARIN BOLUS VIA INFUSION
4000.0000 [IU] | Freq: Once | INTRAVENOUS | Status: AC
  Administered 2024-07-15: 4000 [IU] via INTRAVENOUS
  Filled 2024-07-15: qty 4000

## 2024-07-15 MED ORDER — INSULIN ASPART 100 UNIT/ML IJ SOLN
0.0000 [IU] | INTRAMUSCULAR | Status: DC
  Administered 2024-07-16: 1 [IU] via SUBCUTANEOUS
  Filled 2024-07-15: qty 1

## 2024-07-15 MED ORDER — LACTATED RINGERS IV SOLN: INTRAVENOUS | Status: AC

## 2024-07-15 MED ORDER — HYDRALAZINE HCL 20 MG/ML IJ SOLN: 10.0000 mg | Freq: Four times a day (QID) | INTRAMUSCULAR | Status: DC | PRN

## 2024-07-15 MED ORDER — HYDROMORPHONE HCL 1 MG/ML IJ SOLN
0.5000 mg | INTRAMUSCULAR | Status: DC | PRN
  Administered 2024-07-15: 0.5 mg via INTRAVENOUS
  Filled 2024-07-15: qty 1

## 2024-07-15 MED ORDER — HEPARIN (PORCINE) 25000 UT/250ML-% IV SOLN
1550.0000 [IU]/h | INTRAVENOUS | Status: DC
  Administered 2024-07-15: 1550 [IU]/h via INTRAVENOUS
  Filled 2024-07-15: qty 250

## 2024-07-15 NOTE — Progress Notes (Signed)
 " PROGRESS NOTE    Keith Moreno  FMW:[id number] DOB: [birth year] DOA: 07/14/2024 PCP: [facility]    Brief Narrative:   Keith Moreno is a 64 y.o. male with past medical history significant for HTN, atrial flutter, DM2, anxiety/depression, esophageal cancer s/p esophagectomy with recent displaced J-tube and underwent replacement by interventional radiology 07/15/2023; who presented to [facility] ED on 07/15/2023 via EMS with generalized abdominal pain, nausea and vomiting.  Patient reports following J-tube replacement, patient went to have dinner at a [employer] and 30 minutes following ingestion of food experiencing abdominal pain associate with nausea and vomiting.  Abdominal pain persisted with further nausea and vomiting and patient called EMS and was brought to the ED for further evaluation management.  Patient continues to endorse flatus and bowel movement.  Denies fever/chills.  In the ED, temperature 97.5 F, HR 118, RR 16, BP 185/107, SpO2 97% on room air.  WBC 10.7, hemoglobin 14.3, platelet count 222.  Sodium 139, potassium 4.0, chloride 103, CO2 22, glucose 150, BUN 24, Gran 0.84.  Lipase 450.  AST 24, ALT 25, total Ruben 0.5.  Urinalysis unrevealing.  CT abdomen/pelvis with contrast with small bowel obstruction with transition point in the anterior left mid abdomen near the jejunostomy tube insertion site, no free air, status post gastric pull-up procedure with moderately distended intrathoracic stomach and dilated intra-abdominal stomach and duodenum, sigmoid and descending colon diverticulosis without evidence of diverticulitis, bilateral renal calculi unchanged without hydronephrosis.  General surgery was consulted.  TRH consulted for admission for further evaluation and management of small bowel obstruction, concerning for retention balloon from recent J-tube placement causing obstruction.  Assessment & Plan:   Small bowel obstruction Patient  presenting to ED with abdominal pain associate with nausea and vomiting after having J-tube replaced same day on 07/15/2023.  CT abdomen/pelvis with contrast with small bowel obstruction with transition point in the anterior left mid abdomen near the jejunostomy tube insertion site, no free air, status post gastric pull-up procedure with moderately distended intrathoracic stomach and dilated intra-abdominal stomach and duodenum.  General surgery reviewed imaging and believe that his obstructive pathology secondary to retention balloon within the jejunum, in which J-tube was replaced same day on 07/15/2023. -- Interventional radiology consulted for evaluation and management -- Continue NG to  to LWIS -- NPO -- LR at 100 mL/h -- Awaits interventional radiology evaluation  HTN On amlodipine  10 mg p.o. daily -- Holding oral medications secondary to small bowel obstruction as above -- Hydralazine  10 mg IV q6h PRN SBP >165  Atrial flutter On Eliquis  at home, not on any rate controlling medications -- Holding Eliquis  for now -- Heparin  drip, pharmacy consult for dosing/monitoring  DM2 On pioglitazone 30 mg p.o. daily, sitagliptin 50 mg p.o. daily at baseline.  Hemoglobin A1c 5.5%, well-controlled. -- Holding oral hypoglycemics while n.p.o. -- sensitive SSI for coverage -- CBG every 4 hours while NPO  Anxiety/depression Currently not on medication outpatient.  Esophageal cancer s/p esophagectomy s/p J-tube Patient underwent replacement of dislodged jejunostomy catheter under fluoroscopic guidance with 18 French balloon retention J-tube on 07/14/2024 by Dr. Luverne.  Patient reports that he typically uses J-tube for hydration as he is typically able to tolerate most things through oral route. -- Interventional radiology consulted as above  Obesity, class III Body mass index is 43.35 kg/m.   DVT prophylaxis: Heparin  drip    Code Status: Full Code Family Communication: Spouse present at  bedside  Disposition Plan:  Level  of care: Med-Surg Status is: Inpatient Remains inpatient appropriate because: Small bowel obstruction    Consultants:  General Surgery Interventional radiology  Procedures:  None  Antimicrobials:  None   Subjective: Patient seen examined bedside, lying in bed.  Mains in ED holding area.  Remains with NG tube in place.  Discussed with general surgery, they believe his obstructive process due to retention bulb and jejunum in which J-tube was replaced same day as ED admission.  Consulting IR for evaluation.  Patient reports abdominal pain improved after NG tube decompression.  Patient with no other specific questions, concerns or complaints at this time.  Denies headache, no dizziness, no chest pain, no palpitations, no shortness of breath, no current nausea/vomiting, no fever/chills/night sweats, no diarrhea, no focal weakness, no fatigue, no paresthesia.  No acute events overnight per nurse note.  Objective: Vitals:   07/15/24 0553 07/15/24 0725 07/15/24 0840 07/15/24 1321  BP:  (!) 189/102 (!) 168/95 (!) 171/85  Pulse:  83 79 81  Resp:  16 16 16   Temp: 97.8 F (36.6 C) 98 F (36.7 C) (!) 97.4 F (36.3 C) 98.6 F (37 C)  TempSrc:  Oral Oral Oral  SpO2:  96% 96% 93%  Weight:      Height:        Intake/Output Summary (Last 24 hours) at 07/15/2024 1418 Last data filed at 07/15/2024 1120 Gross per 24 hour  Intake 30 ml  Output 1200 ml  Net -1170 ml   Filed Weights   07/15/24 0435  Weight: (!) 145 kg    Examination:  Physical Exam: GEN: NAD, alert and oriented x 3, obese HEENT: NCAT, PERRL, EOMI, sclera clear, MMM PULM: CTAB w/o wheezes/crackles, normal respiratory effort, on room air with SpO2 93% at rest CV: RRR w/o M/G/R GI: abd soft, NTND, + faint bowel sounds MSK: no peripheral edema, moves all extremities independently NEURO: No focal neurological deficit PSYCH: normal mood/affect Integumentary: No concerning rashes  oxygen/wounds no exposed skin surfaces    Data Reviewed: I have personally reviewed following labs and imaging studies  CBC: Recent Labs  Lab 07/14/24 2212 07/15/24 0409  WBC 10.7* 7.9  NEUTROABS  --  7.1  HGB 14.3 13.1  HCT 43.8 39.6  MCV 90.1 89.8  PLT 222 194   Basic Metabolic Panel: Recent Labs  Lab 07/14/24 2212 07/15/24 0409  NA 139 140  K 4.0 3.6  CL 103 110  CO2 22 23  GLUCOSE 150* 122*  BUN 24* 19  CREATININE 0.84 0.59*  CALCIUM  9.8 8.0*   GFR: Estimated Creatinine Clearance: 139.8 mL/min (A) (by C-G formula based on SCr of 0.59 mg/dL (L)). Liver Function Tests: Recent Labs  Lab 07/14/24 2212 07/15/24 0409  AST 24 16  ALT 25 17  ALKPHOS 141* 105  BILITOT 0.5 0.4  PROT 7.8 5.8*  ALBUMIN  4.3 3.3*   Recent Labs  Lab 07/14/24 2212  LIPASE 450*   No results for input(s): AMMONIA in the last 168 hours. Coagulation Profile: No results for input(s): INR, PROTIME in the last 168 hours. Cardiac Enzymes: No results for input(s): CKTOTAL, CKMB, CKMBINDEX, TROPONINI in the last 168 hours. BNP (last 3 results) No results for input(s): PROBNP in the last 8760 hours. HbA1C: Recent Labs    07/15/24 0409  HGBA1C 5.5   CBG: Recent Labs  Lab 07/15/24 0846 07/15/24 1146  GLUCAP 99 96   Lipid Profile: No results for input(s): CHOL, HDL, LDLCALC, TRIG, CHOLHDL, LDLDIRECT in the last 72  hours. Thyroid  Function Tests: No results for input(s): TSH, T4TOTAL, FREET4, T3FREE, THYROIDAB in the last 72 hours. Anemia Panel: No results for input(s): VITAMINB12, FOLATE, FERRITIN, TIBC, IRON, RETICCTPCT in the last 72 hours. Sepsis Labs: No results for input(s): PROCALCITON, LATICACIDVEN in the last 168 hours.  No results found for this or any previous visit (from the past 240 hours).       Radiology Studies: DG Abdomen 1 View Result Date: 07/15/2024 EXAM: 1 VIEW XRAY OF THE ABDOMEN 07/15/2024 12:40:00  AM COMPARISON: CT earlier today. CLINICAL HISTORY: NG tube placement. FINDINGS: LINES, TUBES AND DEVICES: NG tube tip is in the stomach below the diaphragm with the side port in the distal gastric pull-through . BOWEL: Dilated upper abdominal small bowel loops noted. SOFT TISSUES: No abnormal calcifications. BONES: No acute fracture. IMPRESSION: 1. Nasogastric tube tip in the abdominal stomach with the side port in the distal gastric pull-through. 2. Dilated upper abdominal small bowel loops. Electronically signed by: Franky Crease MD 07/15/2024 12:52 AM EST RP Workstation: [id number]   CT ABDOMEN PELVIS W CONTRAST Result Date: 07/15/2024 EXAM: CT ABDOMEN AND PELVIS WITH CONTRAST 07/14/2024 11:51:31 PM TECHNIQUE: CT of the abdomen and pelvis was performed with the administration of 100 mL of iohexol  (OMNIPAQUE ) 300 MG/ML solution. Multiplanar reformatted images are provided for review. Automated exposure control, iterative reconstruction, and/or weight-based adjustment of the mA/kV was utilized to reduce the radiation dose to as low as reasonably achievable. COMPARISON: MRI abdomen 04/04/2024 and CT chest abdomen and pelvis 03/24/2024. CLINICAL HISTORY: Abdominal pain, post-op. FINDINGS: LOWER CHEST: Atelectasis in the right middle lobe and right lower lobe. Calcified bilateral hilar lymph nodes. The intrathoracic portion of the stomach appears moderately distended with fluid. LIVER: Scattered round hypodensities, too small to characterize, likely cysts. GALLBLADDER AND BILE DUCTS: Gallbladder is unremarkable. No biliary ductal dilatation. SPLEEN: No acute abnormality. PANCREAS: No acute abnormality. ADRENAL GLANDS: No acute abnormality. KIDNEYS, URETERS AND BLADDER: Bilateral renal calculi appear unchanged. No hydronephrosis. No perinephric or periureteral stranding. Urinary bladder is unremarkable. GI AND BOWEL: Patient is status post gastric pull up procedure. The intrathoracic portion of the stomach appears  moderately distended with fluid. The intraabdominal portion of the stomach is dilated with air fluid level as well as the duodenum. There are dilated jejunal loops with air fluid levels measuring up to 4.7 cm. Transition point is seen against the anterior left mid abdomen against the abdominal wall proximal to insertion site of the jejunostomy tube. Findings are compatible with small bowel obstruction. A percutaneous jejunostomy tube is in place with distal tip within jejunal loops in the central abdomen. These loops of bowel appear nondilated. The appendix appears within normal limits. There is sigmoid and descending colon diverticulosis. No free air identified. PERITONEUM AND RETROPERITONEUM: No ascites. No free air. VASCULATURE: Aorta is normal in caliber. LYMPH NODES: No lymphadenopathy. REPRODUCTIVE ORGANS: No acute abnormality. BONES AND SOFT TISSUES: No acute osseous abnormality. There is a small fat containing right inguinal hernia. IMPRESSION: 1. Small bowel obstruction with transition point in the anterior left mid abdomen near the jejunostomy tube insertion site. Nofree air. 2. Status post gastric pull up procedure with moderately distended intrathoracic stomach and dilated intraabdominal stomach and duodenum. 3. Sigmoid and descending colon diverticulosis without evidence of diverticulitis. 4. Bilateral renal calculi, unchanged, without hydronephrosis. Electronically signed by: Greig Pique MD 07/15/2024 12:01 AM EST RP Workstation: [id number]   IR Replc Duoden/Jejuno Luetta Percut W/Fluoro Result Date: 07/14/2024 INDICATION: Dislodged chronic indwelling 18 French jejunostomy  tube. A Foley catheter was placed in the tract yesterday. EXAM: REPLACEMENT JEJUNOSTOMY TUBE UNDER FLUOROSCOPIC GUIDANCE MEDICATIONS: None ANESTHESIA/SEDATION: None CONTRAST:  10 mL Omnipaque  300 - administered into the gastric lumen. FLUOROSCOPY TIME:  Radiation exposure index: 72 mGy Kerma COMPLICATIONS: None immediate. PROCEDURE:  Informed written consent was obtained from the patient after a thorough discussion of the procedural risks, benefits and alternatives. All questions were addressed. Maximal Sterile Barrier Technique was utilized including caps, mask, sterile gowns, sterile gloves, sterile drape, hand hygiene and skin antiseptic. A timeout was performed prior to the initiation of the procedure. The gastrojejunal tube exit site was probed with a 5 French Kumpe the catheter. The catheter was advanced under fluoroscopy into the jejunum. The catheter was further advanced into small bowel over a hydrophilic guidewire. An 82 French balloon retention jejunostomy catheter was trimmed to appropriate length and advanced over the wire. Final catheter position was confirmed by fluoroscopy 8 and contrast injection to confirm position. The retention balloon was inflated with approximately 9 mL of saline. The tube was flushed with saline. FINDINGS: By the time the patient arrive for tube replacement, the Foley catheter placed in the Emergency Department yesterday had fallen out. The jejunostomy tract was able to be catheterized easily with a 5 French catheter back into the jejunal lumen. A new 18 French balloon retention jejunal catheter was advanced well into the jejunum with position confirmed by contrast injection and a fluoroscopic image. The catheter is ready for immediate use. IMPRESSION: Replacement of dislodged jejunostomy catheter under fluoroscopic guidance with new 18 French balloon retention jejunostomy tube. The catheter tip is well into the jejunum and the catheter is ready for immediate use. Electronically Signed   By: Marcey Moan M.D.   On: 07/14/2024 14:27        Scheduled Meds:  insulin  aspart  0-9 Units Subcutaneous Q4H   Continuous Infusions:  heparin  1,550 Units/hr (07/15/24 0423)   lactated ringers  75 mL/hr at 07/15/24 1120     LOS: 0 days    Time spent: 52 minutes spent on 07/15/2024 caring for this  patient face-to-face including chart review, ordering labs/tests, documenting, discussion with nursing staff, consultants, updating family and interview/physical exam    Camellia PARAS Pio Eatherly, DO [facility] Available via Epic secure chat 7am-7pm After these hours, please refer to coverage provider listed on [email or web address] 07/15/2024, 2:18 PM   "

## 2024-07-15 NOTE — ED Notes (Signed)
 Patient stated his unable to urinate right now. Will try again later.

## 2024-07-15 NOTE — Progress Notes (Signed)
 PHARMACY - ANTICOAGULATION CONSULT NOTE  Pharmacy Consult for Heparin  (PTA apixaban  on hold) Indication: atrial fibrillation  Allergies[1]  Patient Measurements:   Weight = 145.2 kg (06/12/2024)  Vital Signs: Temp: 97.7 F (36.5 C) (01/06 0140) Temp Source: Oral (01/05 2156) BP: 169/95 (01/06 0315) Pulse Rate: 81 (01/06 0315)  Labs: Recent Labs    07/14/24 2212  HGB 14.3  HCT 43.8  PLT 222  CREATININE 0.84    CrCl cannot be calculated (Unknown ideal weight.).   Medical History: Past Medical History:  Diagnosis Date   Anxiety    Cancer (HCC)    Esophageal Cancer   COVID 2022   mild case   Diabetes mellitus type 2, controlled (HCC) 07/15/2024   Dyspnea    r/t chemo and radiation   Headache    History of kidney stones    Hypertension    Kidney infection    at age 80   Pneumonia    63 years old   Pre-diabetes    PTSD (post-traumatic stress disorder)    per pt, if woken up suddenly he cocks back arm as if to punch but usually wakes up enough to come to before he hits anyone    Medications:  PTA apixaban  5mg  BID - LD 07/13/2024  Assessment: 64 yr male with abdominal pain s/p J-tube replacement this AM by IR.  PMH significant for AFib, esophageal cancer CT: SBO PTA apixaban  on hold and IV heparin  to begin Will monitor heparin  therapy initially with aPTT as heparin  levels may be falsely elevated due to recent DOAC use.  Goal of Therapy:  Heparin  level 0.3-0.7 units/ml aPTT 66-102 seconds Monitor platelets by anticoagulation protocol: Yes   Plan:  Heparin  4000 units IV bolus x 1 Heparin  gtt @ 1550 units/hr Check aPTT & heparin  level 6 hr after infusion started Daily heparin  level & CBC Monitor for signs & symptoms of bleeding  Arvin Gauss, PharmD 07/15/2024,4:00 AM      [1]  Allergies Allergen Reactions   Procaine Shortness Of Breath    OK to use Lidocaine  for IV starts      Chocolate     Hyperactivity, respiratory distress   Lisinopril      Other Reaction(s): Cough   Other     Novocaine- respiratory distress

## 2024-07-15 NOTE — ED Notes (Signed)
 Call to floor, advised pt was otw up

## 2024-07-15 NOTE — Plan of Care (Signed)

## 2024-07-15 NOTE — Progress Notes (Signed)
 PHARMACY - ANTICOAGULATION CONSULT NOTE  Pharmacy Consult for Heparin  (PTA apixaban  on hold) Indication: atrial fibrillation  Allergies[1]  Patient Measurements: Height: 6' (182.9 cm) Weight: (!) 145 kg (319 lb 10.7 oz) IBW/kg (Calculated) : 77.6 HEPARIN  DW (KG): 111.4 Weight = 145.2 kg (06/12/2024)  Vital Signs: Temp: 98.6 F (37 C) (01/06 1321) Temp Source: Oral (01/06 1321) BP: 171/85 (01/06 1321) Pulse Rate: 81 (01/06 1321)  Labs: Recent Labs    07/14/24 2212 07/15/24 0409 07/15/24 0437 07/15/24 1240  HGB 14.3 13.1  --   --   HCT 43.8 39.6  --   --   PLT 222 194  --   --   APTT  --   --  31 52*  HEPARINUNFRC  --   --  0.25* 0.50  CREATININE 0.84 0.59*  --   --     Estimated Creatinine Clearance: 139.8 mL/min (A) (by C-G formula based on SCr of 0.59 mg/dL (L)).   Medical History: Past Medical History:  Diagnosis Date   Anxiety    Cancer (HCC)    Esophageal Cancer   COVID 2022   mild case   Diabetes mellitus type 2, controlled (HCC) 07/15/2024   Dyspnea    r/t chemo and radiation   Headache    History of kidney stones    Hypertension    Kidney infection    at age 22   Pneumonia    64 years old   Pre-diabetes    PTSD (post-traumatic stress disorder)    per pt, if woken up suddenly he cocks back arm as if to punch but usually wakes up enough to come to before he hits anyone    Medications:  PTA apixaban  5mg  BID - LD 07/13/2024  Assessment: 64 yr male with abdominal pain s/p J-tube replacement this AM by IR. PMH significant for Afib on Eliquis  which has been on hold for replacement of J-tube, esophageal cancer. Imaging concerning for SBO. PTA apixaban  remains on hold and pharmacy consulted to start heparin  infusion.  Initial heparin  level drawn shortly after heparin  infusion started low at 0.25, likely related to patient holding Eliquis  PTA for procedure.  -Heparin  level 0.5, aPTT 52 - suspect effects of Eliquis  mostly diminished and heparin  level now  therapeutic with heparin  infusing at 1550 units/hr -CBC stable -No complications of therapy noted  Goal of Therapy:  Heparin  level 0.3-0.7 units/mL Monitor platelets by anticoagulation protocol: Yes   Plan:  -Continue heparin  infusion at 1550 units/hr -Check heparin  level ~ 6 hours to confirm -Daily heparin  level & CBC -Monitor for signs & symptoms of bleeding -Follow for ability to transition back to Eliquis   Stefano MARLA Bologna, PharmD, BCPS Clinical Pharmacist 07/15/2024 1:57 PM        [1]  Allergies Allergen Reactions   Procaine Shortness Of Breath    OK to use Lidocaine  for IV starts      Chocolate     Hyperactivity, respiratory distress   Lisinopril     Other Reaction(s): Cough   Other     Novocaine- respiratory distress

## 2024-07-15 NOTE — Progress Notes (Signed)
 Initial Nutrition Assessment  DOCUMENTATION CODES:   Morbid obesity  INTERVENTION:  - Will monitor for nutrition plans.   - If unable to advance oral diet/initiate tube feeds within the next 3-4 days, recommend consideration for TPN.   NUTRITION DIAGNOSIS:   Inadequate oral intake related to inability to eat as evidenced by NPO status.  GOAL:   Patient will meet greater than or equal to 90% of their needs  MONITOR:   Diet advancement, Labs, Weight trends  REASON FOR ASSESSMENT:   Malnutrition Screening Tool, Diagnosis, Other (Comment) (Receives J-tube feeds at home)    ASSESSMENT:   64 y.o. male with PMH of esophageal cancer s/p esophagectomy (2023), DM2, HTN, depression and anxiety, atrial flutter who presented because of sudden onset of worsening abdominal pain. Patient had his J-tube displaced 1 day PTA which was replaced on day of admission by IR/ Following this he went home and at home developed sudden onset of epigastric abdominal discomfort with multiple episodes of vomiting. Admitted for small bowel obstruction.   1/5 J-tube replaced and went home but represented due to abdominal pain and vomiting; Admit for SBO 1/6 NGT placed  Patient in bed at time of visit, NGT in place to LIS. Significant other at bedside.   UBW reported to be 320# and he feels his weight has been stable.   Patient reports eating by mouth and getting nighttime J-tube feeds. Endorses eating 2-3 meals a day of solid food with no issues. However, endorses issues with drinking water  and states he is not able to drink any water . Does fine with other liquids such as milk, tea, juice.   He reports he gets a small amount of nutrition via his nighttime feeds but mostly uses the tube to meet his hydration needs since he is unable to do so via PO.  TF reports nightly TF regimen as below: 2 cartons of Osmolite 1.5 + 16 oz of water  together in pump run over ~12 hours (starting around 7p and running until  the next morning)  *with amount would be closer to 11 hour run time + 185mL FWF every hour (x11 hours) (= Provides 711 kcals, 30g protein, and (formula+flush)  Patient had J-tube replaced yesterday and now admitted for SBO. IR consulted as Gen Surg concerned new J-tube may be part of the issue with obstruction. No nutrition plans at this time. Will monitor for diet advancement/ability to start TF.   Medications reviewed and include: -  Labs reviewed:  - HA1C 5.5 (as of 1/6)  NUTRITION - FOCUSED PHYSICAL EXAM:  No Depletions  Diet Order:   Diet Order             Diet NPO time specified Except for: Sips with Meds  Diet effective now                   EDUCATION NEEDS:  Education needs have been addressed  Skin:  Skin Assessment: Reviewed RN Assessment  Last BM:  1/5  Height:  Ht Readings from Last 1 Encounters:  07/15/24 6' (1.829 m)   Weight:  Wt Readings from Last 1 Encounters:  07/15/24 (!) 145 kg   Ideal Body Weight:  80.91 kg  BMI:  Body mass index is 43.35 kg/m.  Estimated Nutritional Needs:  Kcal:  2300-2500 kcals Protein:  115-130 grams Fluid:  >/= 2.3L    Keith Moreno RD, LDN Contact via Secure Chat.

## 2024-07-15 NOTE — H&P (Signed)
 " History and Physical    Keith Moreno FMW:[id number] DOB: 1960/09/22 DOA: 07/14/2024  Patient coming from: Home.  Chief Complaint: Abdominal pain and nausea vomiting.  HPI: Keith Moreno is a 64 y.o. male with history of esophageal cancer status post esophagectomy, diabetes mellitus type 2, hypertension, depression and anxiety, atrial flutter presents to the ER because of sudden onset of worsening abdominal pain.  Patient had his J-tube displaced yesterday which was replaced today by interventional radiologist.  Following which he went home and at home he developed sudden onset of epigastric abdominal discomfort with multiple episodes of vomiting.  Patient presents to the ER.  ED Course: In the ER CT abdomen pelvis shows features concerning for small bowel obstruction with transition point around the jejunostomy tube.  Dr. Aron on-call general surgeon was consulted.  Patient had NG tube placed admitted for further management.  Labs showed WBC of 10.7 hemoglobin 14.3 creatinine 0.8 lipase 450.  Review of Systems: As per HPI, rest all negative.   Past Medical History:  Diagnosis Date   Anxiety    Cancer (HCC)    Esophageal Cancer   COVID 2022   mild case   Diabetes mellitus type 2, controlled (HCC) 07/15/2024   Dyspnea    r/t chemo and radiation   Headache    History of kidney stones    Hypertension    Kidney infection    at age 38   Pneumonia    64 years old   Pre-diabetes    PTSD (post-traumatic stress disorder)    per pt, if woken up suddenly he cocks back arm as if to punch but usually wakes up enough to come to before he hits anyone    Past Surgical History:  Procedure Laterality Date   BIOPSY  11/13/2021   Procedure: BIOPSY;  Surgeon: Albertus Gordy HERO, MD;  Location: [facility] ENDOSCOPY;  Service: Gastroenterology;;   BIOPSY  12/14/2021   Procedure: BIOPSY;  Surgeon: Wilhelmenia Aloha Raddle., MD;  Location: [facility] ENDOSCOPY;  Service: Gastroenterology;;   BRONCHIAL BRUSHINGS   12/14/2021   Procedure: BRONCHIAL BRUSHINGS;  Surgeon: Shelah Lamar RAMAN, MD;  Location: [facility] ENDOSCOPY;  Service: Cardiopulmonary;;   BRONCHIAL NEEDLE ASPIRATION BIOPSY  12/14/2021   Procedure: BRONCHIAL NEEDLE ASPIRATION BIOPSIES;  Surgeon: Shelah Lamar RAMAN, MD;  Location: [facility] ENDOSCOPY;  Service: Cardiopulmonary;;   COLONOSCOPY  2018   CYST EXCISION  2022   left side of head   ESOPHAGEAL STENT PLACEMENT N/A 06/27/2022   Procedure: ESOPHAGEAL STENT REMOVAL;  Surgeon: Shyrl Linnie KIDD, MD;  Location: [facility] OR;  Service: Thoracic;  Laterality: N/A;   ESOPHAGEAL STENT PLACEMENT N/A 06/28/2022   Procedure: ESOPHAGEAL STENT PLACEMENT;  Surgeon: Shyrl Linnie KIDD, MD;  Location: [facility] OR;  Service: Thoracic;  Laterality: N/A;   ESOPHAGEAL STENT PLACEMENT N/A 08/10/2022   Procedure: ESOPHAGEAL STENT REMOVAL;  Surgeon: Shyrl Linnie KIDD, MD;  Location: [facility] OR;  Service: Thoracic;  Laterality: N/A;   ESOPHAGECTOMY  04/05/2022   ESOPHAGOGASTRODUODENOSCOPY N/A 11/13/2021   Procedure: ESOPHAGOGASTRODUODENOSCOPY (EGD);  Surgeon: Albertus Gordy HERO, MD;  Location: [facility] ENDOSCOPY;  Service: Gastroenterology;  Laterality: N/A;   ESOPHAGOGASTRODUODENOSCOPY N/A 04/05/2022   Procedure: ESOPHAGOGASTRODUODENOSCOPY (EGD);  Surgeon: Shyrl Linnie KIDD, MD;  Location: [facility] OR;  Service: Thoracic;  Laterality: N/A;   ESOPHAGOGASTRODUODENOSCOPY N/A 05/09/2022   Procedure: ESOPHAGOGASTRODUODENOSCOPY (EGD);  Surgeon: Shyrl Linnie KIDD, MD;  Location: [facility] OR;  Service: Thoracic;  Laterality: N/A;  esophageal stent placement   ESOPHAGOGASTRODUODENOSCOPY N/A 06/27/2022   Procedure: ESOPHAGOGASTRODUODENOSCOPY (EGD);  Surgeon: Shyrl Linnie KIDD, MD;  Location: [facility] OR;  Service: Thoracic;  Laterality: N/A;   ESOPHAGOGASTRODUODENOSCOPY N/A 06/28/2022   Procedure: ESOPHAGOGASTRODUODENOSCOPY (EGD);  Surgeon: Shyrl Linnie KIDD, MD;  Location: [facility] OR;  Service: Thoracic;  Laterality: N/A;   ESOPHAGOGASTRODUODENOSCOPY N/A 08/10/2022    Procedure: ESOPHAGOGASTRODUODENOSCOPY (EGD);  Surgeon: Shyrl Linnie KIDD, MD;  Location: [facility] OR;  Service: Thoracic;  Laterality: N/A;   ESOPHAGOGASTRODUODENOSCOPY (EGD) WITH PROPOFOL  N/A 12/14/2021   Procedure: ESOPHAGOGASTRODUODENOSCOPY (EGD) WITH PROPOFOL ;  Surgeon: Wilhelmenia Aloha Raddle., MD;  Location: [facility] ENDOSCOPY;  Service: Gastroenterology;  Laterality: N/A;   EUS N/A 12/14/2021   Procedure: UPPER ENDOSCOPIC ULTRASOUND (EUS) LINEAR;  Surgeon: Wilhelmenia Aloha Raddle., MD;  Location: [facility] ENDOSCOPY;  Service: Gastroenterology;  Laterality: N/A;   FINE NEEDLE ASPIRATION  12/14/2021   Procedure: FINE NEEDLE ASPIRATION (FNA) LINEAR;  Surgeon: Wilhelmenia Aloha Raddle., MD;  Location: [facility] ENDOSCOPY;  Service: Gastroenterology;;   FOREIGN BODY REMOVAL  11/13/2021   Procedure: FOREIGN BODY REMOVAL;  Surgeon: Albertus Gordy HERO, MD;  Location: [facility] ENDOSCOPY;  Service: Gastroenterology;;   HUMERUS FRACTURE SURGERY Right    INTERCOSTAL NERVE BLOCK  04/05/2022   Procedure: INTERCOSTAL NERVE BLOCK;  Surgeon: Shyrl Linnie KIDD, MD;  Location: [facility] OR;  Service: Thoracic;;   IR IMAGING GUIDED PORT INSERTION  12/23/2021   IR REMOVAL TUN ACCESS W/ PORT W/O FL MOD SED  05/04/2023   IR REPLC DUODEN/JEJUNO TUBE PERCUT W/FLUORO  10/12/2022   IR REPLC DUODEN/JEJUNO TUBE PERCUT W/FLUORO  02/07/2024   IR REPLC DUODEN/JEJUNO TUBE PERCUT W/FLUORO  07/14/2024   IR THORACENTESIS ASP PLEURAL SPACE W/IMG GUIDE  04/19/2022   LAPAROSCOPIC JEJUNOSTOMY N/A 05/09/2022   Procedure: LAPAROSCOPIC JEJUNOSTOMY;  Surgeon: Shyrl Linnie KIDD, MD;  Location: [facility] OR;  Service: Thoracic;  Laterality: N/A;   MULTIPLE TOOTH EXTRACTIONS  2015   NODE DISSECTION  04/05/2022   Procedure: NODE DISSECTION;  Surgeon: Shyrl Linnie KIDD, MD;  Location: [facility] OR;  Service: Thoracic;;   TONSILLECTOMY AND ADENOIDECTOMY     as a child   VIDEO ASSISTED THORACOSCOPY Right 05/09/2022   Procedure: VIDEO ASSISTED THORACOSCOPY;  Surgeon: Shyrl Linnie KIDD, MD;  Location: [facility] OR;  Service: Thoracic;  Laterality: Right;   VIDEO BRONCHOSCOPY WITH ENDOBRONCHIAL ULTRASOUND Bilateral 12/14/2021   Procedure: VIDEO BRONCHOSCOPY WITH ENDOBRONCHIAL ULTRASOUND;  Surgeon: Shelah Lamar RAMAN, MD;  Location: [facility] ENDOSCOPY;  Service: Cardiopulmonary;  Laterality: Bilateral;     reports that he quit smoking about 43 years ago. His smoking use included cigars, cigarettes, and pipe. He quit smokeless tobacco use about 43 years ago.  His smokeless tobacco use included chew. He reports that he does not currently use alcohol. He reports that he does not use drugs.  Allergies[1]  Family History  Problem Relation Age of Onset   Hypertension Mother    Diabetes Mother    Bipolar disorder Mother    Hypertension Father    Kidney cancer Sister    Colon cancer Paternal Uncle    Cancer Cousin    Bladder Cancer Cousin    Cancer Cousin     Prior to Admission medications  Medication Sig Start Date End Date Taking? Authorizing Provider  acetaminophen  (TYLENOL ) 500 MG tablet Take 2 tablets (1,000 mg total) by mouth every 6 (six) hours as needed for mild pain or fever. Patient taking differently: Take 500 mg by mouth every 6 (six) hours as needed for mild pain (pain score 1-3) or fever. 08/11/22  Yes Barrett, Erin R, PA-C  amLODipine  (  NORVASC ) 10 MG tablet Take 10 mg by mouth every evening. 07/10/22  Yes [provider]  apixaban  (ELIQUIS ) 5 MG TABS tablet Take 1 tablet (5 mg total) by mouth 2 (two) times daily. 02/05/24  Yes Federico Norleen ONEIDA MADISON, MD  lansoprazole  (PREVACID  SOLUTAB) 30 MG disintegrating tablet Take 30 mg by mouth 2 (two) times daily before a meal. Dissolve 1 tablet in mouth twice a day before meals (take on an empty stomach 30 minutes prior to a meal)   Yes [provider]  lidocaine  (LIDODERM ) 5 % Place 1 patch onto the skin See admin instructions. Apply 1 patch to the skin once daily for back pain (apply for 12 hours, then remove for 12  hours) as needed   Yes [provider]  Melatonin 10 MG TABS Take 10 mg by mouth at bedtime. Patient taking differently: Take 30 mg by mouth at bedtime. 05/15/23  Yes McDiarmid, Krystal BIRCH, MD  Multiple Vitamins-Minerals (MULTIVITAMINS/MINERALS ADULT PO) Take 1 each by mouth in the morning. Gummies   Yes [provider]  Nutritional Supplements (FEEDING SUPPLEMENT, OSMOLITE 1.5 CAL,) LIQD Place 1,000 mLs into feeding tube See admin instructions. Take 1 can by gastric tube every 3 hours   Yes [provider]  ondansetron  (ZOFRAN -ODT) 8 MG disintegrating tablet Take 1 tablet (8 mg total) by mouth every 8 (eight) hours as needed for nausea or vomiting. 12/15/21  Yes Thayil, Irene T, PA-C  pioglitazone (ACTOS) 30 MG tablet Take 30 mg by mouth daily.   Yes [provider]  sertraline  (ZOLOFT ) 100 MG tablet Take 100 mg by mouth daily.   Yes [provider]  simethicone (MYLICON) 125 MG chewable tablet Chew 250 mg by mouth every 6 (six) hours as needed for flatulence.   Yes [provider]  sitaGLIPtin (JANUVIA) 50 MG tablet Take 50 mg by mouth at bedtime.   Yes [provider]  SUMAtriptan (IMITREX) 25 MG tablet Take 25 mg by mouth every 2 (two) hours as needed. 05/22/24  Yes [provider]  famotidine  (PEPCID ) 20 MG tablet Take 1 tablet (20 mg total) by mouth 2 (two) times daily. Patient not taking: Reported on 07/15/2024 05/15/23   Joshua Domino, DO  sertraline  (ZOLOFT ) 25 MG tablet Take 1 tablet (25 mg total) by mouth daily. Patient not taking: Reported on 07/15/2024 02/03/24   Federico Norleen ONEIDA MADISON, MD    Physical Exam: Constitutional: Moderately built and nourished. Vitals:   07/15/24 0140 07/15/24 0200 07/15/24 0245 07/15/24 0315  BP:  (!) 178/100 (!) 170/100 (!) 169/95  Pulse:  84 86 81  Resp:   16 16  Temp: 97.7 F (36.5 C)     TempSrc:      SpO2:  92% 91% 95%   Eyes: Anicteric no pallor. ENMT: No discharge from the ears eyes nose  or mouth. Neck: No mass felt.  No neck rigidity. Respiratory: No rhonchi or crepitations. Cardiovascular: S1-S2 heard. Abdomen: Soft nontender bowel sound not appreciated. Musculoskeletal: No edema. Skin: No rash. Neurologic: Alert awake oriented to time place and person.  Moves all extremities. Psychiatric: Appears normal.  Normal affect.   Labs on Admission: I have personally reviewed following labs and imaging studies  CBC: Recent Labs  Lab 07/14/24 2212  WBC 10.7*  HGB 14.3  HCT 43.8  MCV 90.1  PLT 222   Basic Metabolic Panel: Recent Labs  Lab 07/14/24 2212  NA 139  K 4.0  CL 103  CO2 22  GLUCOSE 150*  BUN 24*  CREATININE 0.84  CALCIUM  9.8   GFR: CrCl cannot be calculated (Unknown ideal weight.). Liver Function Tests: Recent Labs  Lab 07/14/24 2212  AST 24  ALT 25  ALKPHOS 141*  BILITOT 0.5  PROT 7.8  ALBUMIN  4.3   Recent Labs  Lab 07/14/24 2212  LIPASE 450*   No results for input(s): AMMONIA in the last 168 hours. Coagulation Profile: No results for input(s): INR, PROTIME in the last 168 hours. Cardiac Enzymes: No results for input(s): CKTOTAL, CKMB, CKMBINDEX, TROPONINI in the last 168 hours. BNP (last 3 results) No results for input(s): PROBNP in the last 8760 hours. HbA1C: No results for input(s): HGBA1C in the last 72 hours. CBG: No results for input(s): GLUCAP in the last 168 hours. Lipid Profile: No results for input(s): CHOL, HDL, LDLCALC, TRIG, CHOLHDL, LDLDIRECT in the last 72 hours. Thyroid  Function Tests: No results for input(s): TSH, T4TOTAL, FREET4, T3FREE, THYROIDAB in the last 72 hours. Anemia Panel: No results for input(s): VITAMINB12, FOLATE, FERRITIN, TIBC, IRON, RETICCTPCT in the last 72 hours. Urine analysis:    Component Value Date/Time   COLORURINE YELLOW 04/03/2022 1403   APPEARANCEUR CLEAR 04/03/2022 1403   LABSPEC 1.020 04/03/2022 1403   PHURINE 6.0  04/03/2022 1403   GLUCOSEU NEGATIVE 04/03/2022 1403   HGBUR NEGATIVE 04/03/2022 1403   BILIRUBINUR NEGATIVE 04/03/2022 1403   KETONESUR NEGATIVE 04/03/2022 1403   PROTEINUR NEGATIVE 04/03/2022 1403   UROBILINOGEN 0.2 01/08/2015 1646   NITRITE NEGATIVE 04/03/2022 1403   LEUKOCYTESUR NEGATIVE 04/03/2022 1403   Sepsis Labs: @LABRCNTIP (procalcitonin:4,lacticidven:4) )No results found for this or any previous visit (from the past 240 hours).   Radiological Exams on Admission: DG Abdomen 1 View Result Date: 07/15/2024 EXAM: 1 VIEW XRAY OF THE ABDOMEN 07/15/2024 12:40:00 AM COMPARISON: CT earlier today. CLINICAL HISTORY: NG tube placement. FINDINGS: LINES, TUBES AND DEVICES: NG tube tip is in the stomach below the diaphragm with the side port in the distal gastric pull-through . BOWEL: Dilated upper abdominal small bowel loops noted. SOFT TISSUES: No abnormal calcifications. BONES: No acute fracture. IMPRESSION: 1. Nasogastric tube tip in the abdominal stomach with the side port in the distal gastric pull-through. 2. Dilated upper abdominal small bowel loops. Electronically signed by: Franky Crease MD 07/15/2024 12:52 AM EST RP Workstation: [id number]   CT ABDOMEN PELVIS W CONTRAST Result Date: 07/15/2024 EXAM: CT ABDOMEN AND PELVIS WITH CONTRAST 07/14/2024 11:51:31 PM TECHNIQUE: CT of the abdomen and pelvis was performed with the administration of 100 mL of iohexol  (OMNIPAQUE ) 300 MG/ML solution. Multiplanar reformatted images are provided for review. Automated exposure control, iterative reconstruction, and/or weight-based adjustment of the mA/kV was utilized to reduce the radiation dose to as low as reasonably achievable. COMPARISON: MRI abdomen 04/04/2024 and CT chest abdomen and pelvis 03/24/2024. CLINICAL HISTORY: Abdominal pain, post-op. FINDINGS: LOWER CHEST: Atelectasis in the right middle lobe and right lower lobe. Calcified bilateral hilar lymph nodes. The intrathoracic portion of the stomach  appears moderately distended with fluid. LIVER: Scattered round hypodensities, too small to characterize, likely cysts. GALLBLADDER AND BILE DUCTS: Gallbladder is unremarkable. No biliary ductal dilatation. SPLEEN: No acute abnormality. PANCREAS: No acute abnormality. ADRENAL GLANDS: No acute abnormality. KIDNEYS, URETERS AND BLADDER: Bilateral renal calculi appear unchanged. No hydronephrosis. No perinephric or periureteral stranding. Urinary bladder is unremarkable. GI AND BOWEL: Patient is status post gastric pull up procedure. The intrathoracic portion of the stomach appears moderately distended with fluid. The intraabdominal portion of the stomach is dilated  with air fluid level as well as the duodenum. There are dilated jejunal loops with air fluid levels measuring up to 4.7 cm. Transition point is seen against the anterior left mid abdomen against the abdominal wall proximal to insertion site of the jejunostomy tube. Findings are compatible with small bowel obstruction. A percutaneous jejunostomy tube is in place with distal tip within jejunal loops in the central abdomen. These loops of bowel appear nondilated. The appendix appears within normal limits. There is sigmoid and descending colon diverticulosis. No free air identified. PERITONEUM AND RETROPERITONEUM: No ascites. No free air. VASCULATURE: Aorta is normal in caliber. LYMPH NODES: No lymphadenopathy. REPRODUCTIVE ORGANS: No acute abnormality. BONES AND SOFT TISSUES: No acute osseous abnormality. There is a small fat containing right inguinal hernia. IMPRESSION: 1. Small bowel obstruction with transition point in the anterior left mid abdomen near the jejunostomy tube insertion site. Nofree air. 2. Status post gastric pull up procedure with moderately distended intrathoracic stomach and dilated intraabdominal stomach and duodenum. 3. Sigmoid and descending colon diverticulosis without evidence of diverticulitis. 4. Bilateral renal calculi, unchanged,  without hydronephrosis. Electronically signed by: Greig Pique MD 07/15/2024 12:01 AM EST RP Workstation: [id number]   IR Replc Duoden/Jejuno Luetta Percut W/Fluoro Result Date: 07/14/2024 INDICATION: Dislodged chronic indwelling 18 French jejunostomy tube. A Foley catheter was placed in the tract yesterday. EXAM: REPLACEMENT JEJUNOSTOMY TUBE UNDER FLUOROSCOPIC GUIDANCE MEDICATIONS: None ANESTHESIA/SEDATION: None CONTRAST:  10 mL Omnipaque  300 - administered into the gastric lumen. FLUOROSCOPY TIME:  Radiation exposure index: 72 mGy Kerma COMPLICATIONS: None immediate. PROCEDURE: Informed written consent was obtained from the patient after a thorough discussion of the procedural risks, benefits and alternatives. All questions were addressed. Maximal Sterile Barrier Technique was utilized including caps, mask, sterile gowns, sterile gloves, sterile drape, hand hygiene and skin antiseptic. A timeout was performed prior to the initiation of the procedure. The gastrojejunal tube exit site was probed with a 5 French Kumpe the catheter. The catheter was advanced under fluoroscopy into the jejunum. The catheter was further advanced into small bowel over a hydrophilic guidewire. An 26 French balloon retention jejunostomy catheter was trimmed to appropriate length and advanced over the wire. Final catheter position was confirmed by fluoroscopy 8 and contrast injection to confirm position. The retention balloon was inflated with approximately 9 mL of saline. The tube was flushed with saline. FINDINGS: By the time the patient arrive for tube replacement, the Foley catheter placed in the Emergency Department yesterday had fallen out. The jejunostomy tract was able to be catheterized easily with a 5 French catheter back into the jejunal lumen. A new 18 French balloon retention jejunal catheter was advanced well into the jejunum with position confirmed by contrast injection and a fluoroscopic image. The catheter is ready for  immediate use. IMPRESSION: Replacement of dislodged jejunostomy catheter under fluoroscopic guidance with new 18 French balloon retention jejunostomy tube. The catheter tip is well into the jejunum and the catheter is ready for immediate use. Electronically Signed   By: Marcey Moan M.D.   On: 07/14/2024 14:27    Assessment/Plan Principal Problem:   SBO (small bowel obstruction) (HCC) Active Problems:   History of esophagectomy   Hypertension   Encounter for jejunostomy care (HCC)   Elevated lipase   Diabetes mellitus type 2, controlled (HCC)    Small bowel obstruction -   patient has been placed on NG tube.  General surgery has been consulted.  Will keep patient NPO.  IV fluids.  Pain relief medications.  Further recommendation per general surgery. Hypertension uncontrolled could be related to stress.  Takes amlodipine  presently on as needed IV hydralazine .  Follow blood pressure trends. History of atrial flutter.  Not on any rate limiting medications.  Takes Eliquis  which was on hold for last 2 days in anticipation of jejunostomy tube placement.  Will keep patient on heparin  bridging for now. Diabetes mellitus type II no recent hemoglobin A1c in the chart.  Takes Actos and Januvia.  Presently on sliding scale coverage.  Check hemoglobin A1c. Elevated lipase levels cause not clear. History of esophageal cancer status post esophagectomy.  Since patient has bowel obstruction will need further management and more than 2 midnight stay.  DVT prophylaxis: Heparin  infusion. Code Status: Full code. Family Communication: Family at the bedside. Disposition Plan: Medical floor. Consults called: General Surgery. Admission status: Inpatient.         [1]  Allergies Allergen Reactions   Procaine Shortness Of Breath    OK to use Lidocaine  for IV starts      Chocolate     Hyperactivity, respiratory distress   Lisinopril     Other Reaction(s): Cough   Other     Novocaine-  respiratory distress   "

## 2024-07-15 NOTE — Progress Notes (Signed)
 Pharmacy: Re- heparin    Patient is a 64 y.o M with hx esophageal cancer, J-tube for dysphagia and afib on Eliquis  PTA who presented to the ED on 07/13/24 due to his J-tube being dislodged. His Eliquis  was on hold PTA in anticipation of J-tube replacement.  J-tube replaced on 07/14/24 and patient was discharged home. He returned back to the ED later in the day after he experienced abdominal pain and n/v at home. Abdominal CT on 07/14/24 showed SBO.  He is currently on heparin  drip in case invasive intervention is needed.   - heparin  level collected at 7 PM is therapeutic at 0.49  Goal of Therapy:  Heparin  level 0.3-0.7 units/mL Monitor platelets by anticoagulation protocol: Yes  Plan: - continue heparin  drip at 1550 units/hr - daily heparin  level and aPTT - monitor for s/sx bleeding  Iantha Batch, PharmD, BCPS 07/15/2024 8:00 PM

## 2024-07-15 NOTE — Procedures (Signed)
 Patient was seen by IR team in regards to jejunostomy tube issues. Per Radiologist request, balloon was deflated with total volume of 10 cc. 5 cc of saline was put back into to the tube.

## 2024-07-16 ENCOUNTER — Inpatient Hospital Stay (HOSPITAL_COMMUNITY)

## 2024-07-16 LAB — CBC
HCT: 45 % (ref 39.0–52.0)
Hemoglobin: 14.6 g/dL (ref 13.0–17.0)
MCH: 29.3 pg (ref 26.0–34.0)
MCHC: 32.4 g/dL (ref 30.0–36.0)
MCV: 90.4 fL (ref 80.0–100.0)
Platelets: 197 K/uL (ref 150–400)
RBC: 4.98 MIL/uL (ref 4.22–5.81)
RDW: 14.4 % (ref 11.5–15.5)
WBC: 5 K/uL (ref 4.0–10.5)
nRBC: 0 % (ref 0.0–0.2)

## 2024-07-16 LAB — BASIC METABOLIC PANEL WITH GFR
Anion gap: 10 (ref 5–15)
BUN: 18 mg/dL (ref 8–23)
CO2: 25 mmol/L (ref 22–32)
Calcium: 9.5 mg/dL (ref 8.9–10.3)
Chloride: 104 mmol/L (ref 98–111)
Creatinine, Ser: 0.76 mg/dL (ref 0.61–1.24)
GFR, Estimated: >60 mL/min
Glucose, Bld: 97 mg/dL (ref 70–99)
Potassium: 3.9 mmol/L (ref 3.5–5.1)
Sodium: 140 mmol/L (ref 135–145)

## 2024-07-16 LAB — GLUCOSE, CAPILLARY
Glucose-Capillary: 100 mg/dL — ABNORMAL HIGH (ref 70–99)
Glucose-Capillary: 105 mg/dL — ABNORMAL HIGH (ref 70–99)
Glucose-Capillary: 105 mg/dL — ABNORMAL HIGH (ref 70–99)
Glucose-Capillary: 130 mg/dL — ABNORMAL HIGH (ref 70–99)
Glucose-Capillary: 85 mg/dL (ref 70–99)
Glucose-Capillary: 96 mg/dL (ref 70–99)

## 2024-07-16 LAB — HEPARIN LEVEL (UNFRACTIONATED): Heparin Unfractionated: 0.55 [IU]/mL (ref 0.30–0.70)

## 2024-07-16 MED ORDER — INSULIN ASPART 100 UNIT/ML IJ SOLN: 0.0000 [IU] | Freq: Three times a day (TID) | INTRAMUSCULAR | Status: DC

## 2024-07-16 MED ORDER — INSULIN ASPART 100 UNIT/ML IJ SOLN: 0.0000 [IU] | Freq: Every day | INTRAMUSCULAR | Status: DC

## 2024-07-16 NOTE — Plan of Care (Signed)

## 2024-07-16 NOTE — Progress Notes (Signed)
 " PROGRESS NOTE    Keith Moreno  FMW:[id number] DOB: [birth year] DOA: 07/14/2024 PCP: [facility]    Brief Narrative:   Keith Moreno is a 64 y.o. male with past medical history significant for HTN, atrial flutter, DM2, anxiety/depression, esophageal cancer s/p esophagectomy with recent displaced J-tube and underwent replacement by interventional radiology 07/15/2023; who presented to [facility] ED on 07/15/2023 via EMS with generalized abdominal pain, nausea and vomiting.  Patient reports following J-tube replacement, patient went to have dinner at a [employer] and 30 minutes following ingestion of food experiencing abdominal pain associate with nausea and vomiting.  Abdominal pain persisted with further nausea and vomiting and patient called EMS and was brought to the ED for further evaluation management.  Patient continues to endorse flatus and bowel movement.  Denies fever/chills.  In the ED, temperature 97.5 F, HR 118, RR 16, BP 185/107, SpO2 97% on room air.  WBC 10.7, hemoglobin 14.3, platelet count 222.  Sodium 139, potassium 4.0, chloride 103, CO2 22, glucose 150, BUN 24, Gran 0.84.  Lipase 450.  AST 24, ALT 25, total Ruben 0.5.  Urinalysis unrevealing.  CT abdomen/pelvis with contrast with small bowel obstruction with transition point in the anterior left mid abdomen near the jejunostomy tube insertion site, no free air, status post gastric pull-up procedure with moderately distended intrathoracic stomach and dilated intra-abdominal stomach and duodenum, sigmoid and descending colon diverticulosis without evidence of diverticulitis, bilateral renal calculi unchanged without hydronephrosis.  General surgery was consulted.  TRH consulted for admission for further evaluation and management of small bowel obstruction, concerning for retention balloon from recent J-tube placement causing obstruction.  Assessment & Plan:   Small bowel obstruction Patient  presenting to ED with abdominal pain associate with nausea and vomiting after having J-tube replaced same day on 07/15/2023.  CT abdomen/pelvis with contrast with small bowel obstruction with transition point in the anterior left mid abdomen near the jejunostomy tube insertion site, no free air, status post gastric pull-up procedure with moderately distended intrathoracic stomach and dilated intra-abdominal stomach and duodenum.  General surgery reviewed imaging and believe that his obstructive pathology secondary to retention balloon within the jejunum, in which J-tube was replaced same day on 07/15/2023.  Interventional radiology was consulted and patient underwent retention balloon adjustment on 07/15/2024. -- NG clamping trial 1/7 with minimal output, will discontinue NG tube and start clear liquid diet -- Advance to full liquid diet as tolerates  HTN On amlodipine  10 mg p.o. daily -- Continue to hold oral medications until demonstrates toleration of oral intake -- Hydralazine  10 mg IV q6h PRN SBP >165  Atrial flutter On Eliquis  at home, not on any rate controlling medications -- Holding Eliquis  for now -- Heparin  drip, pharmacy consult for dosing/monitoring; will change back to Eliquis  once demonstrates toleration of oral intake  DM2 On pioglitazone 30 mg p.o. daily, sitagliptin 50 mg p.o. daily at baseline.  Hemoglobin A1c 5.5%, well-controlled. -- Holding oral hypoglycemics while n.p.o. -- sensitive SSI for coverage -- CBG every 4 hours while NPO  Anxiety/depression Currently not on medication outpatient.  Esophageal cancer s/p esophagectomy s/p J-tube Patient underwent replacement of dislodged jejunostomy catheter under fluoroscopic guidance with 18 French balloon retention J-tube on 07/14/2024 by Dr. Luverne.  Patient reports that he typically uses J-tube for hydration as he is typically able to tolerate most things through oral route.  Seen by IR 1/6 with adjustment of retention  balloon.  Obesity, class III Body mass index  is 43.35 kg/m.   DVT prophylaxis: Heparin  drip    Code Status: Full Code Family Communication: Spouse present at bedside  Disposition Plan:  Level of care: Med-Surg Status is: Inpatient Remains inpatient appropriate because: Small bowel obstruction    Consultants:  General Surgery Interventional radiology  Procedures:  None  Antimicrobials:  None   Subjective: Patient seen examined bedside, lying in bed.  Mains in ED holding area.  Remains with NG tube in place.  Discussed with general surgery, they believe his obstructive process due to retention bulb and jejunum in which J-tube was replaced same day as ED admission.  Consulting IR for evaluation.  Patient reports abdominal pain improved after NG tube decompression.  Patient with no other specific questions, concerns or complaints at this time.  Denies headache, no dizziness, no chest pain, no palpitations, no shortness of breath, no current nausea/vomiting, no fever/chills/night sweats, no diarrhea, no focal weakness, no fatigue, no paresthesia.  No acute events overnight per nurse note.  Objective: Vitals:   07/15/24 1635 07/15/24 2011 07/16/24 0431 07/16/24 1209  BP: (!) 158/73 (!) 163/89 (!) 170/82 (!) 158/88  Pulse: 80 86 79 89  Resp: 17 18 18 16   Temp: 98.6 F (37 C) 98.4 F (36.9 C) 97.9 F (36.6 C) 98.2 F (36.8 C)  TempSrc: Oral Oral Oral Oral  SpO2: 93% 94% 96% 96%  Weight:      Height:        Intake/Output Summary (Last 24 hours) at 07/16/2024 1317 Last data filed at 07/16/2024 1236 Gross per 24 hour  Intake 2497.07 ml  Output 1725 ml  Net 772.07 ml   Filed Weights   07/15/24 0435  Weight: (!) 145 kg    Examination:  Physical Exam: GEN: NAD, alert and oriented x 3, obese HEENT: NCAT, PERRL, EOMI, sclera clear, MMM, NG tube noted in place with minimal output. PULM: CTAB w/o wheezes/crackles, normal respiratory effort, on room air with SpO2 93% at  rest CV: RRR w/o M/G/R GI: abd soft, NTND, + faint bowel sounds MSK: no peripheral edema, moves all extremities independently NEURO: No focal neurological deficit PSYCH: normal mood/affect Integumentary: No concerning rashes oxygen/wounds no exposed skin surfaces    Data Reviewed: I have personally reviewed following labs and imaging studies  CBC: Recent Labs  Lab 07/14/24 2212 07/15/24 0409 07/16/24 0448  WBC 10.7* 7.9 5.0  NEUTROABS  --  7.1  --   HGB 14.3 13.1 14.6  HCT 43.8 39.6 45.0  MCV 90.1 89.8 90.4  PLT 222 194 197   Basic Metabolic Panel: Recent Labs  Lab 07/14/24 2212 07/15/24 0409 07/16/24 0448  NA 139 140 140  K 4.0 3.6 3.9  CL 103 110 104  CO2 22 23 25   GLUCOSE 150* 122* 97  BUN 24* 19 18  CREATININE 0.84 0.59* 0.76  CALCIUM  9.8 8.0* 9.5   GFR: Estimated Creatinine Clearance: 139.8 mL/min (by C-G formula based on SCr of 0.76 mg/dL). Liver Function Tests: Recent Labs  Lab 07/14/24 2212 07/15/24 0409  AST 24 16  ALT 25 17  ALKPHOS 141* 105  BILITOT 0.5 0.4  PROT 7.8 5.8*  ALBUMIN  4.3 3.3*   Recent Labs  Lab 07/14/24 2212  LIPASE 450*   No results for input(s): AMMONIA in the last 168 hours. Coagulation Profile: No results for input(s): INR, PROTIME in the last 168 hours. Cardiac Enzymes: No results for input(s): CKTOTAL, CKMB, CKMBINDEX, TROPONINI in the last 168 hours. BNP (last 3 results) No  results for input(s): PROBNP in the last 8760 hours. HbA1C: Recent Labs    07/15/24 0409  HGBA1C 5.5   CBG: Recent Labs  Lab 07/15/24 2012 07/16/24 0020 07/16/24 0420 07/16/24 0810 07/16/24 1208  GLUCAP 95 105* 100* 105* 85   Lipid Profile: No results for input(s): CHOL, HDL, LDLCALC, TRIG, CHOLHDL, LDLDIRECT in the last 72 hours. Thyroid  Function Tests: No results for input(s): TSH, T4TOTAL, FREET4, T3FREE, THYROIDAB in the last 72 hours. Anemia Panel: No results for input(s): VITAMINB12,  FOLATE, FERRITIN, TIBC, IRON, RETICCTPCT in the last 72 hours. Sepsis Labs: No results for input(s): PROCALCITON, LATICACIDVEN in the last 168 hours.  No results found for this or any previous visit (from the past 240 hours).       Radiology Studies: DG Abd Portable 1V Result Date: 07/16/2024 CLINICAL DATA:  [id number] Small bowel obstruction (HCC) [id number] EXAM: PORTABLE ABDOMEN - 1 VIEW COMPARISON:  KUB, 07/15/2024. CT AP, 07/14/2024. IR fluoroscopy, 07/14/2024. FINDINGS: Support lines; enteric decompression tube tip, incompletely visualized however relatively unchanged in position. Jejunostomy tube at LEFT lower quadrant, also relatively unchanged in positioning. The bowel gas pattern is normal. Relative paucity of small bowel gas. No radio-opaque calculi or interval osseous abnormality. IMPRESSION: 1. Stable lines and tubes. 2. Relative paucity small bowel gas, with a nonobstructed bowel pattern. Electronically Signed   By: Thom Hall M.D.   On: 07/16/2024 07:16   IR PATIENT EVAL TECH 0-60 MINS Result Date: 07/15/2024 Baldwin Andrea POUR, RT     07/15/2024  2:38 PM Patient was seen by IR team in regards to jejunostomy tube issues. Per Radiologist request, balloon was deflated with total volume of 10 cc. 5 cc of saline was put back into to the tube.   DG Abdomen 1 View Result Date: 07/15/2024 EXAM: 1 VIEW XRAY OF THE ABDOMEN 07/15/2024 12:40:00 AM COMPARISON: CT earlier today. CLINICAL HISTORY: NG tube placement. FINDINGS: LINES, TUBES AND DEVICES: NG tube tip is in the stomach below the diaphragm with the side port in the distal gastric pull-through . BOWEL: Dilated upper abdominal small bowel loops noted. SOFT TISSUES: No abnormal calcifications. BONES: No acute fracture. IMPRESSION: 1. Nasogastric tube tip in the abdominal stomach with the side port in the distal gastric pull-through. 2. Dilated upper abdominal small bowel loops. Electronically signed by: Franky Crease MD 07/15/2024 12:52 AM  EST RP Workstation: [id number]   CT ABDOMEN PELVIS W CONTRAST Result Date: 07/15/2024 EXAM: CT ABDOMEN AND PELVIS WITH CONTRAST 07/14/2024 11:51:31 PM TECHNIQUE: CT of the abdomen and pelvis was performed with the administration of 100 mL of iohexol  (OMNIPAQUE ) 300 MG/ML solution. Multiplanar reformatted images are provided for review. Automated exposure control, iterative reconstruction, and/or weight-based adjustment of the mA/kV was utilized to reduce the radiation dose to as low as reasonably achievable. COMPARISON: MRI abdomen 04/04/2024 and CT chest abdomen and pelvis 03/24/2024. CLINICAL HISTORY: Abdominal pain, post-op. FINDINGS: LOWER CHEST: Atelectasis in the right middle lobe and right lower lobe. Calcified bilateral hilar lymph nodes. The intrathoracic portion of the stomach appears moderately distended with fluid. LIVER: Scattered round hypodensities, too small to characterize, likely cysts. GALLBLADDER AND BILE DUCTS: Gallbladder is unremarkable. No biliary ductal dilatation. SPLEEN: No acute abnormality. PANCREAS: No acute abnormality. ADRENAL GLANDS: No acute abnormality. KIDNEYS, URETERS AND BLADDER: Bilateral renal calculi appear unchanged. No hydronephrosis. No perinephric or periureteral stranding. Urinary bladder is unremarkable. GI AND BOWEL: Patient is status post gastric pull up procedure. The intrathoracic portion of the stomach appears moderately distended with  fluid. The intraabdominal portion of the stomach is dilated with air fluid level as well as the duodenum. There are dilated jejunal loops with air fluid levels measuring up to 4.7 cm. Transition point is seen against the anterior left mid abdomen against the abdominal wall proximal to insertion site of the jejunostomy tube. Findings are compatible with small bowel obstruction. A percutaneous jejunostomy tube is in place with distal tip within jejunal loops in the central abdomen. These loops of bowel appear nondilated. The appendix  appears within normal limits. There is sigmoid and descending colon diverticulosis. No free air identified. PERITONEUM AND RETROPERITONEUM: No ascites. No free air. VASCULATURE: Aorta is normal in caliber. LYMPH NODES: No lymphadenopathy. REPRODUCTIVE ORGANS: No acute abnormality. BONES AND SOFT TISSUES: No acute osseous abnormality. There is a small fat containing right inguinal hernia. IMPRESSION: 1. Small bowel obstruction with transition point in the anterior left mid abdomen near the jejunostomy tube insertion site. Nofree air. 2. Status post gastric pull up procedure with moderately distended intrathoracic stomach and dilated intraabdominal stomach and duodenum. 3. Sigmoid and descending colon diverticulosis without evidence of diverticulitis. 4. Bilateral renal calculi, unchanged, without hydronephrosis. Electronically signed by: Greig Pique MD 07/15/2024 12:01 AM EST RP Workstation: [id number]        Scheduled Meds:  insulin  aspart  0-9 Units Subcutaneous Q4H   Continuous Infusions:  heparin  1,550 Units/hr (07/16/24 1236)     LOS: 1 day    Time spent: 52 minutes spent on 07/16/2024 caring for this patient face-to-face including chart review, ordering labs/tests, documenting, discussion with nursing staff, consultants, updating family and interview/physical exam    Camellia PARAS Stacy Deshler, DO [facility] Available via Epic secure chat 7am-7pm After these hours, please refer to coverage provider listed on [email or web address] 07/16/2024, 1:17 PM   "

## 2024-07-16 NOTE — TOC Initial Note (Signed)
 Transition of Care [facility]) - Initial/Assessment Note    Patient Details  Name: Keith Moreno MRN: [id number] Date of Birth: 1961-02-07  Transition of Care [facility]) CM/SW Contact:    Doneta Glenys DASEN, RN Phone Number: 07/16/2024, 1:38 PM  Clinical Narrative:                 CM called [facility] Provider-[facility] SW-Shchaleah Kelton [phone] ext. [phone] IP CM following  Expected Discharge Plan: Home/Self Care Barriers to Discharge: Continued Medical Work up   Patient Goals and CMS Choice Patient states their goals for this hospitalization and ongoing recovery are:: Home with wife CMS Medicare.gov Compare Post Acute Care list provided to:: Patient Choice offered to / list presented to : Patient [place] ownership interest in [facility].provided to:: Patient    Expected Discharge Plan and Services In-house Referral: NA Discharge Planning Services: CM Consult   Living arrangements for the past 2 months: Single Family Home                 DME Arranged: N/A DME Agency: NA       HH Arranged: NA HH Agency: NA        Prior Living Arrangements/Services Living arrangements for the past 2 months: Single Family Home Lives with:: Spouse Patient language and need for interpreter reviewed:: Yes Do you feel safe going back to the place where you live?: Yes      Need for Family Participation in Patient Care: Yes (Comment) Care giver support system in place?: Yes (comment) Current home services:  (na) Criminal Activity/Legal Involvement Pertinent to Current Situation/Hospitalization: No - Comment as needed  Activities of Daily Living   ADL Screening (condition at time of admission) Independently performs ADLs?: Yes (appropriate for developmental age) Is the patient deaf or have difficulty hearing?: No Does the patient have difficulty seeing, even when wearing glasses/contacts?: No Does the patient have difficulty concentrating, remembering, or making  decisions?: No  Permission Sought/Granted Permission sought to share information with : Case Manager Permission granted to share information with : Yes, Verbal Permission Granted  Share Information with NAME: Jensen,Juku E  Significant other, Emergency Contact  [phone]  Permission granted to share info w [facility]: [facility]        Emotional Assessment Appearance:: Appears stated age Attitude/Demeanor/Rapport: Engaged Affect (typically observed): Appropriate Orientation: : Oriented to Self, Oriented to Place, Oriented to  Time, Oriented to Situation Alcohol / Substance Use: Not Applicable Psych Involvement: No (comment)  Admission diagnosis:  Coagulopathy [D68.9] SBO (small bowel obstruction) (HCC) [K56.609] Patient Active Problem List   Diagnosis Date Noted   SBO (small bowel obstruction) (HCC) 07/15/2024   Elevated lipase 07/15/2024   Diabetes mellitus type 2, controlled (HCC) 07/15/2024   EKG abnormalities 05/15/2023   Encounter for jejunostomy care (HCC) 05/14/2023   Hypotension 05/12/2023   Prolonged Q-T interval on ECG 05/12/2023   Chest pain 05/11/2023   H/O esophagectomy 08/10/2022   Mediastinitis 05/30/2022   Pressure injury of skin 05/09/2022   Esophageal anastomotic leak 05/09/2022   Hypercalcemia 05/05/2022   History of esophagectomy 05/05/2022   Dysphagia 05/05/2022   Hypertension    Nocturnal hypoxia    Malnutrition of moderate degree 04/06/2022   Esophageal cancer (HCC) 04/05/2022   Port-A-Cath in place 12/26/2021   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 [place] [address] Phone: [phone] Fax: [phone]     Social Drivers of Health (SDOH) Social History: SDOH Screenings   Food Insecurity: No Food Insecurity (07/15/2024)  Housing: Low Risk (07/15/2024)  Transportation Needs: No Transportation Needs (07/15/2024)  Utilities: Not At Risk (07/15/2024)  Depression (PHQ2-9): Low Risk (08/18/2022)  Tobacco Use: Medium Risk (07/14/2024)   SDOH Interventions:     Readmission Risk Interventions    07/16/2024    1:37 PM  Readmission Risk Prevention Plan  Post Dischage Appt Complete  Medication Screening Complete  Transportation Screening Complete

## 2024-07-16 NOTE — Progress Notes (Signed)
 PHARMACY - ANTICOAGULATION CONSULT NOTE  Pharmacy Consult for Heparin  (PTA apixaban  on hold) Indication: atrial fibrillation  Allergies[1]  Patient Measurements: Height: 6' (182.9 cm) Weight: (!) 145 kg (319 lb 10.7 oz) IBW/kg (Calculated) : 77.6 HEPARIN  DW (KG): 111.4 Weight = 145.2 kg (06/12/2024)  Vital Signs: Temp: 97.9 F (36.6 C) (01/07 0431) Temp Source: Oral (01/07 0431) BP: 170/82 (01/07 0431) Pulse Rate: 79 (01/07 0431)  Labs: Recent Labs    07/14/24 2212 07/15/24 0409 07/15/24 0437 07/15/24 0437 07/15/24 1240 07/15/24 1859 07/16/24 0448  HGB 14.3 13.1  --   --   --   --  14.6  HCT 43.8 39.6  --   --   --   --  45.0  PLT 222 194  --   --   --   --  197  APTT  --   --  31  --  52*  --   --   HEPARINUNFRC  --   --  0.25*   < > 0.50 0.49 0.55  CREATININE 0.84 0.59*  --   --   --   --  0.76   < > = values in this interval not displayed.    Estimated Creatinine Clearance: 139.8 mL/min (by C-G formula based on SCr of 0.76 mg/dL).   Medical History: Past Medical History:  Diagnosis Date   Anxiety    Cancer (HCC)    Esophageal Cancer   COVID 2022   mild case   Diabetes mellitus type 2, controlled (HCC) 07/15/2024   Dyspnea    r/t chemo and radiation   Headache    History of kidney stones    Hypertension    Kidney infection    at age 48   Pneumonia    64 years old   Pre-diabetes    PTSD (post-traumatic stress disorder)    per pt, if woken up suddenly he cocks back arm as if to punch but usually wakes up enough to come to before he hits anyone    Medications:  PTA apixaban  5mg  BID - LD 07/13/2024  Assessment: 64 yr male with abdominal pain s/p J-tube replacement this AM by IR. PMH significant for Afib on Eliquis  which has been on hold for replacement of J-tube, esophageal cancer. Imaging concerning for SBO. PTA apixaban  remains on hold and pharmacy consulted to start heparin  infusion.  Initial heparin  level drawn shortly after heparin  infusion  started low at 0.25, likely related to patient holding Eliquis  PTA for procedure.  -Heparin  level 0.55 - remains therapeutic with heparin  infusing at 1550 units/hr -CBC stable -No complications of therapy noted  Goal of Therapy:  Heparin  level 0.3-0.7 units/mL Monitor platelets by anticoagulation protocol: Yes   Plan:  -Continue heparin  infusion at 1550 units/hr -Daily heparin  level & CBC -Monitor for signs & symptoms of bleeding -Follow for ability to transition back to Eliquis    Stefano MARLA Bologna, PharmD, BCPS Clinical Pharmacist 07/16/2024 7:24 AM         [1]  Allergies Allergen Reactions   Procaine Shortness Of Breath    OK to use Lidocaine  for IV starts      Chocolate     Hyperactivity, respiratory distress   Lisinopril     Other Reaction(s): Cough   Other     Novocaine- respiratory distress

## 2024-07-17 ENCOUNTER — Inpatient Hospital Stay (HOSPITAL_COMMUNITY)

## 2024-07-17 LAB — HEPARIN LEVEL (UNFRACTIONATED): Heparin Unfractionated: 0.33 [IU]/mL (ref 0.30–0.70)

## 2024-07-17 LAB — CBC
HCT: 40.1 % (ref 39.0–52.0)
Hemoglobin: 13 g/dL (ref 13.0–17.0)
MCH: 29.3 pg (ref 26.0–34.0)
MCHC: 32.4 g/dL (ref 30.0–36.0)
MCV: 90.5 fL (ref 80.0–100.0)
Platelets: 174 K/uL (ref 150–400)
RBC: 4.43 MIL/uL (ref 4.22–5.81)
RDW: 14 % (ref 11.5–15.5)
WBC: 4.6 K/uL (ref 4.0–10.5)
nRBC: 0 % (ref 0.0–0.2)

## 2024-07-17 LAB — GLUCOSE, CAPILLARY: Glucose-Capillary: 91 mg/dL (ref 70–99)

## 2024-07-17 MED ORDER — APIXABAN 5 MG PO TABS
5.0000 mg | ORAL_TABLET | Freq: Two times a day (BID) | ORAL | Status: DC
  Administered 2024-07-17: 5 mg via ORAL
  Filled 2024-07-17: qty 1

## 2024-07-17 NOTE — Discharge Summary (Signed)
 " Physician Discharge Summary  Keith Moreno FMW:[id number] DOB: 05-03-1961 DOA: 07/14/2024  PCP: [facility]  Admit date: 07/14/2024 Discharge date: 07/17/2024  Admitted From: Home Disposition: Home  Recommendations for Outpatient Follow-up:  Follow up with PCP in 1-2 weeks Follow-up with interventional radiology as needed for management of J-tube  Home Health: No Equipment/Devices: None  Discharge Condition: Stable CODE STATUS: Full code Diet recommendation: Heart healthy/consistent carb regular diet  History of present illness:  Keith Moreno is a 64 y.o. male with past medical history significant for HTN, atrial flutter, DM2, anxiety/depression, esophageal cancer s/p esophagectomy with recent displaced J-tube and underwent replacement by interventional radiology 07/15/2023; who presented to [facility] ED on 07/15/2023 via EMS with generalized abdominal pain, nausea and vomiting.  Patient reports following J-tube replacement, patient went to have dinner at a [employer] and 30 minutes following ingestion of food experiencing abdominal pain associate with nausea and vomiting.  Abdominal pain persisted with further nausea and vomiting and patient called EMS and was brought to the ED for further evaluation management.  Patient continues to endorse flatus and bowel movement.  Denies fever/chills.   In the ED, temperature 97.5 F, HR 118, RR 16, BP 185/107, SpO2 97% on room air.  WBC 10.7, hemoglobin 14.3, platelet count 222.  Sodium 139, potassium 4.0, chloride 103, CO2 22, glucose 150, BUN 24, Gran 0.84.  Lipase 450.  AST 24, ALT 25, total Ruben 0.5.  Urinalysis unrevealing.  CT abdomen/pelvis with contrast with small bowel obstruction with transition point in the anterior left mid abdomen near the jejunostomy tube insertion site, no free air, status post gastric pull-up procedure with moderately distended intrathoracic stomach and dilated intra-abdominal stomach  and duodenum, sigmoid and descending colon diverticulosis without evidence of diverticulitis, bilateral renal calculi unchanged without hydronephrosis.  General surgery was consulted.  TRH consulted for admission for further evaluation and management of small bowel obstruction, concerning for retention balloon from recent J-tube placement causing obstruction.  Hospital course:  Small bowel obstruction 2/2 J-tube retention balloon Patient presenting to ED with abdominal pain associate with nausea and vomiting after having J-tube replaced same day on 07/15/2023.  CT abdomen/pelvis with contrast with small bowel obstruction with transition point in the anterior left mid abdomen near the jejunostomy tube insertion site, no free air, status post gastric pull-up procedure with moderately distended intrathoracic stomach and dilated intra-abdominal stomach and duodenum.  General surgery reviewed imaging and believe that his obstructive pathology secondary to retention balloon within the jejunum, in which J-tube was replaced same day on 07/15/2023.  Interventional radiology was consulted and patient underwent retention balloon adjustment on 07/15/2024.  Patient underwent NG tube clamping with minimal output and was discontinued.  Diet was slowly advance with toleration.  Outpatient follow-up with PCP/interventional radiology for tube management.   HTN On amlodipine  10 mg p.o. daily   Atrial flutter On Eliquis  at home, not on any rate controlling medications   DM2 On pioglitazone 30 mg p.o. daily, sitagliptin 50 mg p.o. daily at baseline.  Hemoglobin A1c 5.5%, well-controlled.  Anxiety/depression Currently not on medication outpatient.   Esophageal cancer s/p esophagectomy s/p J-tube Patient underwent replacement of dislodged jejunostomy catheter under fluoroscopic guidance with 18 French balloon retention J-tube on 07/14/2024 by Dr. Luverne.  Patient reports that he typically uses J-tube for hydration as he is  typically able to tolerate most things through oral route.  Seen by IR 1/6 with adjustment of retention balloon.   Obesity, class  III Body mass index is 43.35 kg/m.  Discharge Diagnoses:  Principal Problem:   SBO (small bowel obstruction) (HCC) Active Problems:   History of esophagectomy   Hypertension   Encounter for jejunostomy care (HCC)   Elevated lipase   Diabetes mellitus type 2, controlled [facility])    Discharge Instructions  Discharge Instructions     Call MD for:  difficulty breathing, headache or visual disturbances   Complete by: As directed    Call MD for:  extreme fatigue   Complete by: As directed    Call MD for:  persistant dizziness or light-headedness   Complete by: As directed    Call MD for:  persistant nausea and vomiting   Complete by: As directed    Call MD for:  severe uncontrolled pain   Complete by: As directed    Call MD for:  temperature >100.4   Complete by: As directed    Increase activity slowly   Complete by: As directed       Allergies as of 07/17/2024       Reactions   Procaine Shortness Of Breath   OK to use Lidocaine  for IV starts   Chocolate    Hyperactivity, respiratory distress   Lisinopril    Other Reaction(s): Cough   Other    Novocaine- respiratory distress        Medication List     STOP taking these medications    famotidine  20 MG tablet Commonly known as: PEPCID        TAKE these medications    acetaminophen  500 MG tablet Commonly known as: TYLENOL  Take 2 tablets (1,000 mg total) by mouth every 6 (six) hours as needed for mild pain or fever. What changed: how much to take   amLODipine  10 MG tablet Commonly known as: NORVASC  Take 10 mg by mouth every evening.   apixaban  5 MG Tabs tablet Commonly known as: ELIQUIS  Take 1 tablet (5 mg total) by mouth 2 (two) times daily.   feeding supplement (OSMOLITE 1.5 CAL) Liqd Place 1,000 mLs into feeding tube See admin instructions. Take 1 can by gastric tube  every 3 hours   lansoprazole  30 MG disintegrating tablet Commonly known as: PREVACID  SOLUTAB Take 30 mg by mouth 2 (two) times daily before a meal. Dissolve 1 tablet in mouth twice a day before meals (take on an empty stomach 30 minutes prior to a meal)   lidocaine  5 % Commonly known as: LIDODERM  Place 1 patch onto the skin See admin instructions. Apply 1 patch to the skin once daily for back pain (apply for 12 hours, then remove for 12 hours) as needed   Melatonin 10 MG Tabs Take 10 mg by mouth at bedtime. What changed: how much to take   MULTIVITAMINS/MINERALS ADULT PO Take 1 each by mouth in the morning. Gummies   ondansetron  8 MG disintegrating tablet Commonly known as: ZOFRAN -ODT Take 1 tablet (8 mg total) by mouth every 8 (eight) hours as needed for nausea or vomiting.   pioglitazone 30 MG tablet Commonly known as: ACTOS Take 30 mg by mouth daily.   sertraline  100 MG tablet Commonly known as: ZOLOFT  Take 100 mg by mouth daily. What changed: Another medication with the same name was removed. Continue taking this medication, and follow the directions you see here.   simethicone 125 MG chewable tablet Commonly known as: MYLICON Chew 250 mg by mouth every 6 (six) hours as needed for flatulence.   sitaGLIPtin 50 MG tablet Commonly known  as: JANUVIA Take 50 mg by mouth at bedtime.   SUMAtriptan 25 MG tablet Commonly known as: IMITREX Take 25 mg by mouth every 2 (two) hours as needed.        Follow-up Information     [facility] up.   Contact information: [address] [facility] [place] [address] [phone]                Allergies[1]  Consultations: General surgery Interventional radiology   Procedures/Studies: DG Abd Portable 1V Result Date: 07/16/2024 CLINICAL DATA:  [id number] Small bowel obstruction (HCC) [id number] EXAM: PORTABLE ABDOMEN - 1 VIEW COMPARISON:  KUB, 07/15/2024. CT AP, 07/14/2024. IR fluoroscopy,  07/14/2024. FINDINGS: Support lines; enteric decompression tube tip, incompletely visualized however relatively unchanged in position. Jejunostomy tube at LEFT lower quadrant, also relatively unchanged in positioning. The bowel gas pattern is normal. Relative paucity of small bowel gas. No radio-opaque calculi or interval osseous abnormality. IMPRESSION: 1. Stable lines and tubes. 2. Relative paucity small bowel gas, with a nonobstructed bowel pattern. Electronically Signed   By: Thom Hall M.D.   On: 07/16/2024 07:16   IR PATIENT EVAL TECH 0-60 MINS Result Date: 07/15/2024 [facility] Andrea POUR, RT     07/15/2024  2:38 PM Patient was seen by IR team in regards to jejunostomy tube issues. Per Radiologist request, balloon was deflated with total volume of 10 cc. 5 cc of saline was put back into to the tube.   DG Abdomen 1 View Result Date: 07/15/2024 EXAM: 1 VIEW XRAY OF THE ABDOMEN 07/15/2024 12:40:00 AM COMPARISON: CT earlier today. CLINICAL HISTORY: NG tube placement. FINDINGS: LINES, TUBES AND DEVICES: NG tube tip is in the stomach below the diaphragm with the side port in the distal gastric pull-through . BOWEL: Dilated upper abdominal small bowel loops noted. SOFT TISSUES: No abnormal calcifications. BONES: No acute fracture. IMPRESSION: 1. Nasogastric tube tip in the abdominal stomach with the side port in the distal gastric pull-through. 2. Dilated upper abdominal small bowel loops. Electronically signed by: Franky Crease MD 07/15/2024 12:52 AM EST RP Workstation: [id number]   CT ABDOMEN PELVIS W CONTRAST Result Date: 07/15/2024 EXAM: CT ABDOMEN AND PELVIS WITH CONTRAST 07/14/2024 11:51:31 PM TECHNIQUE: CT of the abdomen and pelvis was performed with the administration of 100 mL of iohexol  (OMNIPAQUE ) 300 MG/ML solution. Multiplanar reformatted images are provided for review. Automated exposure control, iterative reconstruction, and/or weight-based adjustment of the mA/kV was utilized to reduce the radiation  dose to as low as reasonably achievable. COMPARISON: MRI abdomen 04/04/2024 and CT chest abdomen and pelvis 03/24/2024. CLINICAL HISTORY: Abdominal pain, post-op. FINDINGS: LOWER CHEST: Atelectasis in the right middle lobe and right lower lobe. Calcified bilateral hilar lymph nodes. The intrathoracic portion of the stomach appears moderately distended with fluid. LIVER: Scattered round hypodensities, too small to characterize, likely cysts. GALLBLADDER AND BILE DUCTS: Gallbladder is unremarkable. No biliary ductal dilatation. SPLEEN: No acute abnormality. PANCREAS: No acute abnormality. ADRENAL GLANDS: No acute abnormality. KIDNEYS, URETERS AND BLADDER: Bilateral renal calculi appear unchanged. No hydronephrosis. No perinephric or periureteral stranding. Urinary bladder is unremarkable. GI AND BOWEL: Patient is status post gastric pull up procedure. The intrathoracic portion of the stomach appears moderately distended with fluid. The intraabdominal portion of the stomach is dilated with air fluid level as well as the duodenum. There are dilated jejunal loops with air fluid levels measuring up to 4.7 cm. Transition point is seen against the anterior left mid abdomen against the abdominal wall proximal to  insertion site of the jejunostomy tube. Findings are compatible with small bowel obstruction. A percutaneous jejunostomy tube is in place with distal tip within jejunal loops in the central abdomen. These loops of bowel appear nondilated. The appendix appears within normal limits. There is sigmoid and descending colon diverticulosis. No free air identified. PERITONEUM AND RETROPERITONEUM: No ascites. No free air. VASCULATURE: Aorta is normal in caliber. LYMPH NODES: No lymphadenopathy. REPRODUCTIVE ORGANS: No acute abnormality. BONES AND SOFT TISSUES: No acute osseous abnormality. There is a small fat containing right inguinal hernia. IMPRESSION: 1. Small bowel obstruction with transition point in the anterior left  mid abdomen near the jejunostomy tube insertion site. Nofree air. 2. Status post gastric pull up procedure with moderately distended intrathoracic stomach and dilated intraabdominal stomach and duodenum. 3. Sigmoid and descending colon diverticulosis without evidence of diverticulitis. 4. Bilateral renal calculi, unchanged, without hydronephrosis. Electronically signed by: Greig Pique MD 07/15/2024 12:01 AM EST RP Workstation: [id number]   IR Replc Duoden/Jejuno Luetta Percut W/Fluoro Result Date: 07/14/2024 INDICATION: Dislodged chronic indwelling 18 French jejunostomy tube. A Foley catheter was placed in the tract yesterday. EXAM: REPLACEMENT JEJUNOSTOMY TUBE UNDER FLUOROSCOPIC GUIDANCE MEDICATIONS: None ANESTHESIA/SEDATION: None CONTRAST:  10 mL Omnipaque  300 - administered into the gastric lumen. FLUOROSCOPY TIME:  Radiation exposure index: 72 mGy Kerma COMPLICATIONS: None immediate. PROCEDURE: Informed written consent was obtained from the patient after a thorough discussion of the procedural risks, benefits and alternatives. All questions were addressed. Maximal Sterile Barrier Technique was utilized including caps, mask, sterile gowns, sterile gloves, sterile drape, hand hygiene and skin antiseptic. A timeout was performed prior to the initiation of the procedure. The gastrojejunal tube exit site was probed with a 5 French Kumpe the catheter. The catheter was advanced under fluoroscopy into the jejunum. The catheter was further advanced into small bowel over a hydrophilic guidewire. An 81 French balloon retention jejunostomy catheter was trimmed to appropriate length and advanced over the wire. Final catheter position was confirmed by fluoroscopy 8 and contrast injection to confirm position. The retention balloon was inflated with approximately 9 mL of saline. The tube was flushed with saline. FINDINGS: By the time the patient arrive for tube replacement, the Foley catheter placed in the Emergency Department  yesterday had fallen out. The jejunostomy tract was able to be catheterized easily with a 5 French catheter back into the jejunal lumen. A new 18 French balloon retention jejunal catheter was advanced well into the jejunum with position confirmed by contrast injection and a fluoroscopic image. The catheter is ready for immediate use. IMPRESSION: Replacement of dislodged jejunostomy catheter under fluoroscopic guidance with new 18 French balloon retention jejunostomy tube. The catheter tip is well into the jejunum and the catheter is ready for immediate use. Electronically Signed   By: Marcey Moan M.D.   On: 07/14/2024 14:27     Subjective: Patient seen examined bedside, sitting in bedside chair, spouse present.  NG tube removed yesterday, now tolerating advance diet.  Ready for discharge home.  No other questions or concerns at this time.  Denies headache, no dizziness, no chest pain, no palpitations, no shortness of breath, no abdominal pain, no fever/chills/night sweats, no nausea/vomiting/diarrhea, no focal weakness, no fatigue, no paresthesias.  No acute events overnight per nursing staff.  Discharge Exam: Vitals:   07/16/24 2117 07/17/24 0521  BP: (!) 142/80 136/74  Pulse: 82 69  Resp: 20 (!) 22  Temp: 99.1 F (37.3 C) 98.1 F (36.7 C)  SpO2: 96% 97%  Vitals:   07/16/24 1209 07/16/24 2115 07/16/24 2117 07/17/24 0521  BP: (!) 158/88 (!) 183/95 (!) 142/80 136/74  Pulse: 89 80 82 69  Resp: 16 18 20  (!) 22  Temp: 98.2 F (36.8 C) 98.4 F (36.9 C) 99.1 F (37.3 C) 98.1 F (36.7 C)  TempSrc: Oral Oral Oral Oral  SpO2: 96% 96% 96% 97%  Weight:      Height:        Physical Exam: GEN: NAD, alert and oriented x 3, obese HEENT: NCAT, PERRL, EOMI, sclera clear, MMM PULM: CTAB w/o wheezes/crackles, normal respiratory effort, room air CV: RRR w/o M/G/R GI: abd soft, NTND, + BS MSK: no peripheral edema, moves all extremities independently with preserved muscle strength NEURO: No  focal neurological deficit PSYCH: normal mood/affect Integumentary: dry/intact, no rashes or wounds    The results of significant diagnostics from this hospitalization (including imaging, microbiology, ancillary and laboratory) are listed below for reference.     Microbiology: No results found for this or any previous visit (from the past 240 hours).   Labs: BNP (last 3 results) No results for input(s): BNP in the last 8760 hours. Basic Metabolic Panel: Recent Labs  Lab 07/14/24 2212 07/15/24 0409 07/16/24 0448  NA 139 140 140  K 4.0 3.6 3.9  CL 103 110 104  CO2 22 23 25   GLUCOSE 150* 122* 97  BUN 24* 19 18  CREATININE 0.84 0.59* 0.76  CALCIUM  9.8 8.0* 9.5   Liver Function Tests: Recent Labs  Lab 07/14/24 2212 07/15/24 0409  AST 24 16  ALT 25 17  ALKPHOS 141* 105  BILITOT 0.5 0.4  PROT 7.8 5.8*  ALBUMIN  4.3 3.3*   Recent Labs  Lab 07/14/24 2212  LIPASE 450*   No results for input(s): AMMONIA in the last 168 hours. CBC: Recent Labs  Lab 07/14/24 2212 07/15/24 0409 07/16/24 0448 07/17/24 0508  WBC 10.7* 7.9 5.0 4.6  NEUTROABS  --  7.1  --   --   HGB 14.3 13.1 14.6 13.0  HCT 43.8 39.6 45.0 40.1  MCV 90.1 89.8 90.4 90.5  PLT 222 194 197 174   Cardiac Enzymes: No results for input(s): CKTOTAL, CKMB, CKMBINDEX, TROPONINI in the last 168 hours. BNP: Invalid input(s): POCBNP CBG: Recent Labs  Lab 07/16/24 0810 07/16/24 1208 07/16/24 1634 07/16/24 2122 07/17/24 0730  GLUCAP 105* 85 130* 96 91   D-Dimer No results for input(s): DDIMER in the last 72 hours. Hgb A1c Recent Labs    07/15/24 0409  HGBA1C 5.5   Lipid Profile No results for input(s): CHOL, HDL, LDLCALC, TRIG, CHOLHDL, LDLDIRECT in the last 72 hours. Thyroid  function studies No results for input(s): TSH, T4TOTAL, T3FREE, THYROIDAB in the last 72 hours.  Invalid input(s): FREET3 Anemia work up No results for input(s): VITAMINB12,  FOLATE, FERRITIN, TIBC, IRON, RETICCTPCT in the last 72 hours. Urinalysis    Component Value Date/Time   COLORURINE YELLOW 07/15/2024 0734   APPEARANCEUR CLEAR 07/15/2024 0734   LABSPEC >1.046 (H) 07/15/2024 0734   PHURINE 5.0 07/15/2024 0734   GLUCOSEU NEGATIVE 07/15/2024 0734   HGBUR NEGATIVE 07/15/2024 0734   BILIRUBINUR NEGATIVE 07/15/2024 0734   KETONESUR 5 (A) 07/15/2024 0734   PROTEINUR 30 (A) 07/15/2024 0734   UROBILINOGEN 0.2 01/08/2015 1646   NITRITE NEGATIVE 07/15/2024 0734   LEUKOCYTESUR NEGATIVE 07/15/2024 0734   Sepsis Labs Recent Labs  Lab 07/14/24 2212 07/15/24 0409 07/16/24 0448 07/17/24 0508  WBC 10.7* 7.9 5.0 4.6   Microbiology  No results found for this or any previous visit (from the past 240 hours).   Time coordinating discharge: Over 30 minutes  SIGNED:   Camellia PARAS Aidah Forquer, DO  [facility] 07/17/2024, 11:01 AM     [1]  Allergies Allergen Reactions   Procaine Shortness Of Breath    OK to use Lidocaine  for IV starts      Chocolate     Hyperactivity, respiratory distress   Lisinopril     Other Reaction(s): Cough   Other     Novocaine- respiratory distress   "

## 2024-07-17 NOTE — Plan of Care (Signed)

## 2024-07-17 NOTE — Progress Notes (Signed)
 PHARMACY - ANTICOAGULATION CONSULT NOTE  Pharmacy Consult for transition back to Eliquis  Indication: atrial fibrillation  Allergies[1]  Patient Measurements: Height: 6' (182.9 cm) Weight: (!) 145 kg (319 lb 10.7 oz) IBW/kg (Calculated) : 77.6 HEPARIN  DW (KG): 111.4 Weight = 145.2 kg (06/12/2024)  Vital Signs: Temp: 98.1 F (36.7 C) (01/08 0521) Temp Source: Oral (01/08 0521) BP: 136/74 (01/08 0521) Pulse Rate: 69 (01/08 0521)  Labs: Recent Labs    07/14/24 2212 07/15/24 0409 07/15/24 0437 07/15/24 0437 07/15/24 1240 07/15/24 1859 07/16/24 0448 07/17/24 0508  HGB 14.3 13.1  --   --   --   --  14.6 13.0  HCT 43.8 39.6  --   --   --   --  45.0 40.1  PLT 222 194  --   --   --   --  197 174  APTT  --   --  31  --  52*  --   --   --   HEPARINUNFRC  --   --  0.25*   < > 0.50 0.49 0.55 0.33  CREATININE 0.84 0.59*  --   --   --   --  0.76  --    < > = values in this interval not displayed.    Estimated Creatinine Clearance: 139.8 mL/min (by C-G formula based on SCr of 0.76 mg/dL).   Medical History: Past Medical History:  Diagnosis Date   Anxiety    Cancer (HCC)    Esophageal Cancer   COVID 2022   mild case   Diabetes mellitus type 2, controlled (HCC) 07/15/2024   Dyspnea    r/t chemo and radiation   Headache    History of kidney stones    Hypertension    Kidney infection    at age 65   Pneumonia    64 years old   Pre-diabetes    PTSD (post-traumatic stress disorder)    per pt, if woken up suddenly he cocks back arm as if to punch but usually wakes up enough to come to before he hits anyone    Medications:  PTA apixaban  5mg  BID - LD 07/13/2024  Assessment: 64 yr male with abdominal pain s/p J-tube replacement this AM by IR. PMH significant for Afib on Eliquis  which has been on hold for replacement of J-tube, esophageal cancer. Imaging concerning for SBO. PTA apixaban  remains on hold and pharmacy consulted to start heparin  infusion.  Initial heparin  level  drawn shortly after heparin  infusion started low at 0.25, likely related to patient holding Eliquis  PTA for procedure.  Pharmacy now consulted for transition back to Eliquis .   Plan:  -Stop heparin  at 0800 with first dose of Eliquis  -Resume Eliquis  5 mg BID   Stefano MARLA Bologna, PharmD, BCPS Clinical Pharmacist 07/17/2024 7:10 AM          [1]  Allergies Allergen Reactions   Procaine Shortness Of Breath    OK to use Lidocaine  for IV starts      Chocolate     Hyperactivity, respiratory distress   Lisinopril     Other Reaction(s): Cough   Other     Novocaine- respiratory distress

## 2024-07-17 NOTE — Plan of Care (Signed)
 " Problem: Education: Goal: Ability to describe self-care measures that may prevent or decrease complications (Diabetes Survival Skills Education) will improve 07/17/2024 1115 by Hogan-Nutting, Burnard HERO, RN Outcome: Adequate for Discharge 07/17/2024 1012 by Hogan-Nutting, Burnard HERO, RN Outcome: Progressing Goal: Individualized Educational Video(s) 07/17/2024 1115 by Reesa Burnard HERO, RN Outcome: Adequate for Discharge 07/17/2024 1012 by Reesa Burnard HERO, RN Outcome: Progressing   Problem: Coping: Goal: Ability to adjust to condition or change in health will improve 07/17/2024 1115 by Reesa Burnard HERO, RN Outcome: Adequate for Discharge 07/17/2024 1012 by Reesa Burnard HERO, RN Outcome: Progressing   Problem: Fluid Volume: Goal: Ability to maintain a balanced intake and output will improve 07/17/2024 1115 by Reesa Burnard HERO, RN Outcome: Adequate for Discharge 07/17/2024 1012 by Hogan-Nutting, Burnard HERO, RN Outcome: Progressing   Problem: Health Behavior/Discharge Planning: Goal: Ability to identify and utilize available resources and services will improve 07/17/2024 1115 by Hogan-Nutting, Burnard HERO, RN Outcome: Adequate for Discharge 07/17/2024 1012 by Reesa Burnard HERO, RN Outcome: Progressing Goal: Ability to manage health-related needs will improve 07/17/2024 1115 by Hogan-Nutting, Burnard HERO, RN Outcome: Adequate for Discharge 07/17/2024 1012 by Reesa Burnard HERO, RN Outcome: Progressing   Problem: Metabolic: Goal: Ability to maintain appropriate glucose levels will improve 07/17/2024 1115 by Reesa Burnard HERO, RN Outcome: Adequate for Discharge 07/17/2024 1012 by Reesa Burnard HERO, RN Outcome: Progressing   Problem: Nutritional: Goal: Maintenance of adequate nutrition will improve 07/17/2024 1115 by Reesa Burnard HERO, RN Outcome: Adequate for Discharge 07/17/2024 1012 by Reesa Burnard HERO, RN Outcome: Progressing Goal: Progress toward achieving  an optimal weight will improve 07/17/2024 1115 by Reesa Burnard HERO, RN Outcome: Adequate for Discharge 07/17/2024 1012 by Reesa Burnard HERO, RN Outcome: Progressing   Problem: Skin Integrity: Goal: Risk for impaired skin integrity will decrease 07/17/2024 1115 by Reesa Burnard HERO, RN Outcome: Adequate for Discharge 07/17/2024 1012 by Reesa Burnard HERO, RN Outcome: Progressing   Problem: Tissue Perfusion: Goal: Adequacy of tissue perfusion will improve 07/17/2024 1115 by Reesa Burnard HERO, RN Outcome: Adequate for Discharge 07/17/2024 1012 by Hogan-Nutting, Burnard HERO, RN Outcome: Progressing   Problem: Education: Goal: Knowledge of General Education information will improve Description: Including pain rating scale, medication(s)/side effects and non-pharmacologic comfort measures 07/17/2024 1115 by Reesa Burnard HERO, RN Outcome: Adequate for Discharge 07/17/2024 1012 by Reesa Burnard HERO, RN Outcome: Progressing   Problem: Health Behavior/Discharge Planning: Goal: Ability to manage health-related needs will improve 07/17/2024 1115 by Reesa Burnard HERO, RN Outcome: Adequate for Discharge 07/17/2024 1012 by Reesa Burnard HERO, RN Outcome: Progressing   Problem: Clinical Measurements: Goal: Ability to maintain clinical measurements within normal limits will improve 07/17/2024 1115 by Reesa Burnard HERO, RN Outcome: Adequate for Discharge 07/17/2024 1012 by Reesa Burnard HERO, RN Outcome: Progressing Goal: Will remain free from infection 07/17/2024 1115 by Reesa Burnard HERO, RN Outcome: Adequate for Discharge 07/17/2024 1012 by Reesa Burnard HERO, RN Outcome: Progressing Goal: Diagnostic test results will improve 07/17/2024 1115 by Reesa Burnard HERO, RN Outcome: Adequate for Discharge 07/17/2024 1012 by Reesa Burnard HERO, RN Outcome: Progressing Goal: Respiratory complications will improve 07/17/2024 1115 by Reesa Burnard HERO,  RN Outcome: Adequate for Discharge 07/17/2024 1012 by Reesa Burnard HERO, RN Outcome: Progressing Goal: Cardiovascular complication will be avoided 07/17/2024 1115 by Reesa Burnard HERO, RN Outcome: Adequate for Discharge 07/17/2024 1012 by Reesa Burnard HERO, RN Outcome: Progressing   Problem: Activity: Goal: Risk for activity intolerance will decrease 07/17/2024 1115 by Hogan-Nutting, Burnard HERO, RN Outcome: Adequate for  Discharge 07/17/2024 1012 by Reesa Burnard HERO, RN Outcome: Progressing   Problem: Nutrition: Goal: Adequate nutrition will be maintained 07/17/2024 1115 by Reesa Burnard HERO, RN Outcome: Adequate for Discharge 07/17/2024 1012 by Reesa Burnard HERO, RN Outcome: Progressing   Problem: Coping: Goal: Level of anxiety will decrease 07/17/2024 1115 by Reesa Burnard HERO, RN Outcome: Adequate for Discharge 07/17/2024 1012 by Reesa Burnard HERO, RN Outcome: Progressing   Problem: Elimination: Goal: Will not experience complications related to bowel motility 07/17/2024 1115 by Reesa Burnard HERO, RN Outcome: Adequate for Discharge 07/17/2024 1012 by Reesa Burnard HERO, RN Outcome: Progressing Goal: Will not experience complications related to urinary retention 07/17/2024 1115 by Reesa Burnard HERO, RN Outcome: Adequate for Discharge 07/17/2024 1012 by Reesa Burnard HERO, RN Outcome: Progressing   Problem: Pain Managment: Goal: General experience of comfort will improve and/or be controlled 07/17/2024 1115 by Reesa Burnard HERO, RN Outcome: Adequate for Discharge 07/17/2024 1012 by Reesa Burnard HERO, RN Outcome: Progressing   Problem: Safety: Goal: Ability to remain free from injury will improve 07/17/2024 1115 by Reesa Burnard HERO, RN Outcome: Adequate for Discharge 07/17/2024 1012 by Reesa Burnard HERO, RN Outcome: Progressing   Problem: Skin Integrity: Goal: Risk for impaired skin integrity will decrease 07/17/2024  1115 by Reesa Burnard HERO, RN Outcome: Adequate for Discharge 07/17/2024 1012 by Reesa Burnard HERO, RN Outcome: Progressing   "

## 2024-10-09 ENCOUNTER — Inpatient Hospital Stay: Admitting: Hematology and Oncology

## 2024-10-09 ENCOUNTER — Inpatient Hospital Stay: Attending: Physician Assistant

## 2024-10-27 ENCOUNTER — Ambulatory Visit: Admitting: Neurology
